# Patient Record
Sex: Male | Born: 1935 | Race: Black or African American | Hispanic: No | Smoking: Former smoker
Health system: Southern US, Community
[De-identification: ages and names within clinical notes are randomized; demographics above are authoritative.]

## PROBLEM LIST (undated history)

## (undated) DIAGNOSIS — D500 Iron deficiency anemia secondary to blood loss (chronic): Secondary | ICD-10-CM

## (undated) DIAGNOSIS — F329 Major depressive disorder, single episode, unspecified: Secondary | ICD-10-CM

## (undated) DIAGNOSIS — M545 Low back pain, unspecified: Secondary | ICD-10-CM

## (undated) DIAGNOSIS — K219 Gastro-esophageal reflux disease without esophagitis: Secondary | ICD-10-CM

## (undated) DIAGNOSIS — E785 Hyperlipidemia, unspecified: Secondary | ICD-10-CM

## (undated) DIAGNOSIS — I10 Essential (primary) hypertension: Secondary | ICD-10-CM

## (undated) DIAGNOSIS — F419 Anxiety disorder, unspecified: Secondary | ICD-10-CM

## (undated) DIAGNOSIS — I351 Nonrheumatic aortic (valve) insufficiency: Secondary | ICD-10-CM

## (undated) DIAGNOSIS — I5189 Other ill-defined heart diseases: Secondary | ICD-10-CM

## (undated) DIAGNOSIS — H409 Unspecified glaucoma: Secondary | ICD-10-CM

## (undated) DIAGNOSIS — I639 Cerebral infarction, unspecified: Secondary | ICD-10-CM

## (undated) DIAGNOSIS — C61 Malignant neoplasm of prostate: Secondary | ICD-10-CM

## (undated) DIAGNOSIS — M1990 Unspecified osteoarthritis, unspecified site: Secondary | ICD-10-CM

## (undated) DIAGNOSIS — N529 Male erectile dysfunction, unspecified: Secondary | ICD-10-CM

## (undated) DIAGNOSIS — I2510 Atherosclerotic heart disease of native coronary artery without angina pectoris: Secondary | ICD-10-CM

## (undated) DIAGNOSIS — F32A Depression, unspecified: Secondary | ICD-10-CM

## (undated) DIAGNOSIS — R7989 Other specified abnormal findings of blood chemistry: Secondary | ICD-10-CM

## (undated) DIAGNOSIS — I6350 Cerebral infarction due to unspecified occlusion or stenosis of unspecified cerebral artery: Secondary | ICD-10-CM

## (undated) DIAGNOSIS — R778 Other specified abnormalities of plasma proteins: Secondary | ICD-10-CM

## (undated) DIAGNOSIS — E119 Type 2 diabetes mellitus without complications: Secondary | ICD-10-CM

## (undated) HISTORY — PX: REPLACEMENT TOTAL KNEE BILATERAL: SUR1225

## (undated) HISTORY — DX: Low back pain: M54.5

## (undated) HISTORY — DX: Cerebral infarction due to unspecified occlusion or stenosis of unspecified cerebral artery: I63.50

## (undated) HISTORY — DX: Unspecified glaucoma: H40.9

## (undated) HISTORY — DX: Iron deficiency anemia secondary to blood loss (chronic): D50.0

## (undated) HISTORY — DX: Nonrheumatic aortic (valve) insufficiency: I35.1

## (undated) HISTORY — DX: Cerebral infarction, unspecified: I63.9

## (undated) HISTORY — DX: Other ill-defined heart diseases: I51.89

## (undated) HISTORY — DX: Anxiety disorder, unspecified: F41.9

## (undated) HISTORY — DX: Gastro-esophageal reflux disease without esophagitis: K21.9

## (undated) HISTORY — DX: Hyperlipidemia, unspecified: E78.5

## (undated) HISTORY — DX: Other specified abnormalities of plasma proteins: R77.8

## (undated) HISTORY — DX: Major depressive disorder, single episode, unspecified: F32.9

## (undated) HISTORY — PX: CARPAL TUNNEL RELEASE: SHX101

## (undated) HISTORY — DX: Male erectile dysfunction, unspecified: N52.9

## (undated) HISTORY — DX: Atherosclerotic heart disease of native coronary artery without angina pectoris: I25.10

## (undated) HISTORY — DX: Low back pain, unspecified: M54.50

## (undated) HISTORY — DX: Other specified abnormal findings of blood chemistry: R79.89

## (undated) HISTORY — DX: Depression, unspecified: F32.A

---

## 2004-06-24 ENCOUNTER — Ambulatory Visit: Payer: Self-pay | Admitting: Internal Medicine

## 2004-11-28 ENCOUNTER — Ambulatory Visit: Payer: Self-pay | Admitting: Internal Medicine

## 2005-05-11 ENCOUNTER — Ambulatory Visit: Payer: Self-pay | Admitting: Radiation Oncology

## 2005-10-03 ENCOUNTER — Inpatient Hospital Stay: Payer: Self-pay | Admitting: Specialist

## 2005-11-13 ENCOUNTER — Encounter: Payer: Self-pay | Admitting: Specialist

## 2005-12-03 ENCOUNTER — Encounter: Payer: Self-pay | Admitting: Specialist

## 2006-01-03 ENCOUNTER — Encounter: Payer: Self-pay | Admitting: Specialist

## 2006-09-07 ENCOUNTER — Ambulatory Visit: Payer: Self-pay | Admitting: Specialist

## 2006-09-28 ENCOUNTER — Inpatient Hospital Stay: Payer: Self-pay | Admitting: Specialist

## 2006-10-22 ENCOUNTER — Encounter: Payer: Self-pay | Admitting: Specialist

## 2006-11-04 ENCOUNTER — Encounter: Payer: Self-pay | Admitting: Specialist

## 2006-12-04 ENCOUNTER — Encounter: Payer: Self-pay | Admitting: Specialist

## 2007-01-04 ENCOUNTER — Encounter: Payer: Self-pay | Admitting: Specialist

## 2007-04-22 ENCOUNTER — Ambulatory Visit: Payer: Self-pay | Admitting: Specialist

## 2007-04-22 ENCOUNTER — Other Ambulatory Visit: Payer: Self-pay

## 2007-04-25 ENCOUNTER — Ambulatory Visit: Payer: Self-pay | Admitting: Internal Medicine

## 2007-04-26 ENCOUNTER — Inpatient Hospital Stay: Payer: Self-pay | Admitting: Specialist

## 2007-05-08 ENCOUNTER — Encounter: Payer: Self-pay | Admitting: Specialist

## 2007-05-25 ENCOUNTER — Emergency Department: Payer: Self-pay | Admitting: Emergency Medicine

## 2007-05-28 ENCOUNTER — Ambulatory Visit: Payer: Self-pay | Admitting: Specialist

## 2007-06-06 ENCOUNTER — Encounter: Payer: Self-pay | Admitting: Specialist

## 2007-07-07 ENCOUNTER — Encounter: Payer: Self-pay | Admitting: Specialist

## 2007-08-04 ENCOUNTER — Encounter: Payer: Self-pay | Admitting: Specialist

## 2007-12-19 ENCOUNTER — Ambulatory Visit: Payer: Self-pay | Admitting: Internal Medicine

## 2008-01-23 ENCOUNTER — Encounter: Payer: Self-pay | Admitting: Specialist

## 2008-02-04 ENCOUNTER — Encounter: Payer: Self-pay | Admitting: Specialist

## 2008-02-07 ENCOUNTER — Other Ambulatory Visit: Payer: Self-pay

## 2008-02-07 ENCOUNTER — Ambulatory Visit: Payer: Self-pay

## 2008-09-29 ENCOUNTER — Encounter: Payer: Self-pay | Admitting: Specialist

## 2008-10-03 ENCOUNTER — Encounter: Payer: Self-pay | Admitting: Specialist

## 2008-11-03 ENCOUNTER — Encounter: Payer: Self-pay | Admitting: Specialist

## 2009-11-17 ENCOUNTER — Encounter: Payer: Self-pay | Admitting: Specialist

## 2009-12-03 ENCOUNTER — Encounter: Payer: Self-pay | Admitting: Specialist

## 2010-01-03 ENCOUNTER — Encounter: Payer: Self-pay | Admitting: Specialist

## 2010-02-02 ENCOUNTER — Ambulatory Visit: Payer: Self-pay | Admitting: Cardiovascular Disease

## 2011-04-21 ENCOUNTER — Ambulatory Visit: Payer: Self-pay | Admitting: Specialist

## 2011-05-05 ENCOUNTER — Ambulatory Visit: Payer: Self-pay | Admitting: Specialist

## 2011-07-05 ENCOUNTER — Ambulatory Visit: Payer: Self-pay | Admitting: Ophthalmology

## 2011-07-05 DIAGNOSIS — I10 Essential (primary) hypertension: Secondary | ICD-10-CM

## 2011-07-18 ENCOUNTER — Ambulatory Visit: Payer: Self-pay | Admitting: Ophthalmology

## 2011-08-23 ENCOUNTER — Ambulatory Visit: Payer: Self-pay

## 2011-08-23 ENCOUNTER — Ambulatory Visit: Payer: Self-pay | Admitting: Urology

## 2012-02-09 DIAGNOSIS — N529 Male erectile dysfunction, unspecified: Secondary | ICD-10-CM | POA: Insufficient documentation

## 2012-04-01 ENCOUNTER — Ambulatory Visit: Payer: Self-pay | Admitting: Ophthalmology

## 2012-04-02 ENCOUNTER — Ambulatory Visit: Payer: Self-pay | Admitting: Ophthalmology

## 2012-10-07 DIAGNOSIS — C61 Malignant neoplasm of prostate: Secondary | ICD-10-CM | POA: Insufficient documentation

## 2013-06-06 DIAGNOSIS — M545 Low back pain, unspecified: Secondary | ICD-10-CM | POA: Diagnosis not present

## 2013-06-06 DIAGNOSIS — IMO0002 Reserved for concepts with insufficient information to code with codable children: Secondary | ICD-10-CM | POA: Diagnosis not present

## 2013-06-16 DIAGNOSIS — F411 Generalized anxiety disorder: Secondary | ICD-10-CM | POA: Diagnosis not present

## 2013-06-16 DIAGNOSIS — F320 Major depressive disorder, single episode, mild: Secondary | ICD-10-CM | POA: Diagnosis not present

## 2013-06-20 DIAGNOSIS — G5600 Carpal tunnel syndrome, unspecified upper limb: Secondary | ICD-10-CM | POA: Diagnosis not present

## 2013-06-20 DIAGNOSIS — G250 Essential tremor: Secondary | ICD-10-CM | POA: Diagnosis not present

## 2013-06-20 DIAGNOSIS — G609 Hereditary and idiopathic neuropathy, unspecified: Secondary | ICD-10-CM | POA: Diagnosis not present

## 2013-06-26 DIAGNOSIS — M479 Spondylosis, unspecified: Secondary | ICD-10-CM | POA: Diagnosis not present

## 2013-06-26 DIAGNOSIS — S8290XD Unspecified fracture of unspecified lower leg, subsequent encounter for closed fracture with routine healing: Secondary | ICD-10-CM | POA: Diagnosis not present

## 2013-06-26 DIAGNOSIS — IMO0002 Reserved for concepts with insufficient information to code with codable children: Secondary | ICD-10-CM | POA: Diagnosis not present

## 2013-06-26 DIAGNOSIS — Z96659 Presence of unspecified artificial knee joint: Secondary | ICD-10-CM | POA: Diagnosis not present

## 2013-07-08 DIAGNOSIS — K5289 Other specified noninfective gastroenteritis and colitis: Secondary | ICD-10-CM | POA: Diagnosis not present

## 2013-07-08 DIAGNOSIS — E119 Type 2 diabetes mellitus without complications: Secondary | ICD-10-CM | POA: Diagnosis not present

## 2013-07-16 DIAGNOSIS — I10 Essential (primary) hypertension: Secondary | ICD-10-CM | POA: Diagnosis not present

## 2013-08-04 DIAGNOSIS — M999 Biomechanical lesion, unspecified: Secondary | ICD-10-CM | POA: Diagnosis not present

## 2013-08-04 DIAGNOSIS — M549 Dorsalgia, unspecified: Secondary | ICD-10-CM | POA: Diagnosis not present

## 2013-08-04 DIAGNOSIS — IMO0001 Reserved for inherently not codable concepts without codable children: Secondary | ICD-10-CM | POA: Diagnosis not present

## 2013-08-04 DIAGNOSIS — M5137 Other intervertebral disc degeneration, lumbosacral region: Secondary | ICD-10-CM | POA: Diagnosis not present

## 2013-08-04 DIAGNOSIS — M5380 Other specified dorsopathies, site unspecified: Secondary | ICD-10-CM | POA: Diagnosis not present

## 2013-08-06 DIAGNOSIS — M549 Dorsalgia, unspecified: Secondary | ICD-10-CM | POA: Diagnosis not present

## 2013-08-06 DIAGNOSIS — M999 Biomechanical lesion, unspecified: Secondary | ICD-10-CM | POA: Diagnosis not present

## 2013-08-06 DIAGNOSIS — IMO0001 Reserved for inherently not codable concepts without codable children: Secondary | ICD-10-CM | POA: Diagnosis not present

## 2013-08-06 DIAGNOSIS — M5380 Other specified dorsopathies, site unspecified: Secondary | ICD-10-CM | POA: Diagnosis not present

## 2013-08-06 DIAGNOSIS — M5137 Other intervertebral disc degeneration, lumbosacral region: Secondary | ICD-10-CM | POA: Diagnosis not present

## 2013-08-07 DIAGNOSIS — M5137 Other intervertebral disc degeneration, lumbosacral region: Secondary | ICD-10-CM | POA: Diagnosis not present

## 2013-08-07 DIAGNOSIS — IMO0001 Reserved for inherently not codable concepts without codable children: Secondary | ICD-10-CM | POA: Diagnosis not present

## 2013-08-07 DIAGNOSIS — M549 Dorsalgia, unspecified: Secondary | ICD-10-CM | POA: Diagnosis not present

## 2013-08-07 DIAGNOSIS — M999 Biomechanical lesion, unspecified: Secondary | ICD-10-CM | POA: Diagnosis not present

## 2013-08-07 DIAGNOSIS — M5380 Other specified dorsopathies, site unspecified: Secondary | ICD-10-CM | POA: Diagnosis not present

## 2013-08-11 DIAGNOSIS — M5137 Other intervertebral disc degeneration, lumbosacral region: Secondary | ICD-10-CM | POA: Diagnosis not present

## 2013-08-11 DIAGNOSIS — M999 Biomechanical lesion, unspecified: Secondary | ICD-10-CM | POA: Diagnosis not present

## 2013-08-11 DIAGNOSIS — M5380 Other specified dorsopathies, site unspecified: Secondary | ICD-10-CM | POA: Diagnosis not present

## 2013-08-11 DIAGNOSIS — IMO0001 Reserved for inherently not codable concepts without codable children: Secondary | ICD-10-CM | POA: Diagnosis not present

## 2013-08-11 DIAGNOSIS — M549 Dorsalgia, unspecified: Secondary | ICD-10-CM | POA: Diagnosis not present

## 2013-08-18 DIAGNOSIS — M549 Dorsalgia, unspecified: Secondary | ICD-10-CM | POA: Diagnosis not present

## 2013-08-18 DIAGNOSIS — IMO0001 Reserved for inherently not codable concepts without codable children: Secondary | ICD-10-CM | POA: Diagnosis not present

## 2013-08-18 DIAGNOSIS — M999 Biomechanical lesion, unspecified: Secondary | ICD-10-CM | POA: Diagnosis not present

## 2013-08-18 DIAGNOSIS — M5137 Other intervertebral disc degeneration, lumbosacral region: Secondary | ICD-10-CM | POA: Diagnosis not present

## 2013-08-18 DIAGNOSIS — M5380 Other specified dorsopathies, site unspecified: Secondary | ICD-10-CM | POA: Diagnosis not present

## 2013-08-20 DIAGNOSIS — F331 Major depressive disorder, recurrent, moderate: Secondary | ICD-10-CM | POA: Diagnosis not present

## 2013-08-21 DIAGNOSIS — M5380 Other specified dorsopathies, site unspecified: Secondary | ICD-10-CM | POA: Diagnosis not present

## 2013-08-21 DIAGNOSIS — I891 Lymphangitis: Secondary | ICD-10-CM | POA: Diagnosis not present

## 2013-08-21 DIAGNOSIS — IMO0001 Reserved for inherently not codable concepts without codable children: Secondary | ICD-10-CM | POA: Diagnosis not present

## 2013-08-21 DIAGNOSIS — M999 Biomechanical lesion, unspecified: Secondary | ICD-10-CM | POA: Diagnosis not present

## 2013-08-25 DIAGNOSIS — M5380 Other specified dorsopathies, site unspecified: Secondary | ICD-10-CM | POA: Diagnosis not present

## 2013-08-25 DIAGNOSIS — M549 Dorsalgia, unspecified: Secondary | ICD-10-CM | POA: Diagnosis not present

## 2013-08-25 DIAGNOSIS — M999 Biomechanical lesion, unspecified: Secondary | ICD-10-CM | POA: Diagnosis not present

## 2013-08-25 DIAGNOSIS — M5137 Other intervertebral disc degeneration, lumbosacral region: Secondary | ICD-10-CM | POA: Diagnosis not present

## 2013-08-25 DIAGNOSIS — IMO0001 Reserved for inherently not codable concepts without codable children: Secondary | ICD-10-CM | POA: Diagnosis not present

## 2013-08-28 DIAGNOSIS — IMO0001 Reserved for inherently not codable concepts without codable children: Secondary | ICD-10-CM | POA: Diagnosis not present

## 2013-08-28 DIAGNOSIS — M5380 Other specified dorsopathies, site unspecified: Secondary | ICD-10-CM | POA: Diagnosis not present

## 2013-08-28 DIAGNOSIS — M549 Dorsalgia, unspecified: Secondary | ICD-10-CM | POA: Diagnosis not present

## 2013-08-28 DIAGNOSIS — M5137 Other intervertebral disc degeneration, lumbosacral region: Secondary | ICD-10-CM | POA: Diagnosis not present

## 2013-08-28 DIAGNOSIS — M999 Biomechanical lesion, unspecified: Secondary | ICD-10-CM | POA: Diagnosis not present

## 2013-09-02 DIAGNOSIS — I2510 Atherosclerotic heart disease of native coronary artery without angina pectoris: Secondary | ICD-10-CM | POA: Diagnosis not present

## 2013-09-02 DIAGNOSIS — E119 Type 2 diabetes mellitus without complications: Secondary | ICD-10-CM | POA: Diagnosis not present

## 2013-09-02 DIAGNOSIS — E785 Hyperlipidemia, unspecified: Secondary | ICD-10-CM | POA: Diagnosis not present

## 2013-09-02 DIAGNOSIS — I10 Essential (primary) hypertension: Secondary | ICD-10-CM | POA: Diagnosis not present

## 2013-09-04 DIAGNOSIS — IMO0001 Reserved for inherently not codable concepts without codable children: Secondary | ICD-10-CM | POA: Diagnosis not present

## 2013-09-04 DIAGNOSIS — M999 Biomechanical lesion, unspecified: Secondary | ICD-10-CM | POA: Diagnosis not present

## 2013-09-04 DIAGNOSIS — I891 Lymphangitis: Secondary | ICD-10-CM | POA: Diagnosis not present

## 2013-09-04 DIAGNOSIS — M5137 Other intervertebral disc degeneration, lumbosacral region: Secondary | ICD-10-CM | POA: Diagnosis not present

## 2013-09-04 DIAGNOSIS — M5380 Other specified dorsopathies, site unspecified: Secondary | ICD-10-CM | POA: Diagnosis not present

## 2013-09-10 DIAGNOSIS — M999 Biomechanical lesion, unspecified: Secondary | ICD-10-CM | POA: Diagnosis not present

## 2013-09-10 DIAGNOSIS — M5137 Other intervertebral disc degeneration, lumbosacral region: Secondary | ICD-10-CM | POA: Diagnosis not present

## 2013-09-10 DIAGNOSIS — M5380 Other specified dorsopathies, site unspecified: Secondary | ICD-10-CM | POA: Diagnosis not present

## 2013-09-10 DIAGNOSIS — IMO0001 Reserved for inherently not codable concepts without codable children: Secondary | ICD-10-CM | POA: Diagnosis not present

## 2013-09-10 DIAGNOSIS — M549 Dorsalgia, unspecified: Secondary | ICD-10-CM | POA: Diagnosis not present

## 2013-09-11 DIAGNOSIS — I10 Essential (primary) hypertension: Secondary | ICD-10-CM | POA: Diagnosis not present

## 2013-10-03 DIAGNOSIS — R262 Difficulty in walking, not elsewhere classified: Secondary | ICD-10-CM | POA: Diagnosis not present

## 2013-10-03 DIAGNOSIS — M25569 Pain in unspecified knee: Secondary | ICD-10-CM | POA: Diagnosis not present

## 2013-10-13 DIAGNOSIS — M2540 Effusion, unspecified joint: Secondary | ICD-10-CM | POA: Diagnosis not present

## 2013-10-13 DIAGNOSIS — I10 Essential (primary) hypertension: Secondary | ICD-10-CM | POA: Diagnosis not present

## 2013-10-22 DIAGNOSIS — M25569 Pain in unspecified knee: Secondary | ICD-10-CM | POA: Diagnosis not present

## 2013-10-22 DIAGNOSIS — R262 Difficulty in walking, not elsewhere classified: Secondary | ICD-10-CM | POA: Diagnosis not present

## 2013-10-29 DIAGNOSIS — R262 Difficulty in walking, not elsewhere classified: Secondary | ICD-10-CM | POA: Diagnosis not present

## 2013-10-29 DIAGNOSIS — M25569 Pain in unspecified knee: Secondary | ICD-10-CM | POA: Diagnosis not present

## 2013-10-31 DIAGNOSIS — M25569 Pain in unspecified knee: Secondary | ICD-10-CM | POA: Diagnosis not present

## 2013-10-31 DIAGNOSIS — R262 Difficulty in walking, not elsewhere classified: Secondary | ICD-10-CM | POA: Diagnosis not present

## 2013-11-03 DIAGNOSIS — R262 Difficulty in walking, not elsewhere classified: Secondary | ICD-10-CM | POA: Diagnosis not present

## 2013-11-03 DIAGNOSIS — M25569 Pain in unspecified knee: Secondary | ICD-10-CM | POA: Diagnosis not present

## 2013-11-10 DIAGNOSIS — H4010X0 Unspecified open-angle glaucoma, stage unspecified: Secondary | ICD-10-CM | POA: Diagnosis not present

## 2013-11-12 DIAGNOSIS — F411 Generalized anxiety disorder: Secondary | ICD-10-CM | POA: Diagnosis not present

## 2013-11-12 DIAGNOSIS — F331 Major depressive disorder, recurrent, moderate: Secondary | ICD-10-CM | POA: Diagnosis not present

## 2013-11-17 DIAGNOSIS — C61 Malignant neoplasm of prostate: Secondary | ICD-10-CM | POA: Diagnosis not present

## 2013-12-03 DIAGNOSIS — M25569 Pain in unspecified knee: Secondary | ICD-10-CM | POA: Diagnosis not present

## 2013-12-03 DIAGNOSIS — R262 Difficulty in walking, not elsewhere classified: Secondary | ICD-10-CM | POA: Diagnosis not present

## 2013-12-12 ENCOUNTER — Ambulatory Visit: Payer: Self-pay | Admitting: Medical

## 2013-12-12 DIAGNOSIS — I10 Essential (primary) hypertension: Secondary | ICD-10-CM | POA: Diagnosis not present

## 2013-12-12 DIAGNOSIS — L089 Local infection of the skin and subcutaneous tissue, unspecified: Secondary | ICD-10-CM | POA: Diagnosis not present

## 2013-12-12 DIAGNOSIS — H409 Unspecified glaucoma: Secondary | ICD-10-CM | POA: Diagnosis not present

## 2013-12-12 DIAGNOSIS — W57XXXA Bitten or stung by nonvenomous insect and other nonvenomous arthropods, initial encounter: Secondary | ICD-10-CM | POA: Diagnosis not present

## 2013-12-12 DIAGNOSIS — Z79899 Other long term (current) drug therapy: Secondary | ICD-10-CM | POA: Diagnosis not present

## 2013-12-12 DIAGNOSIS — Z96659 Presence of unspecified artificial knee joint: Secondary | ICD-10-CM | POA: Diagnosis not present

## 2013-12-12 DIAGNOSIS — E119 Type 2 diabetes mellitus without complications: Secondary | ICD-10-CM | POA: Diagnosis not present

## 2013-12-24 DIAGNOSIS — E7800 Pure hypercholesterolemia, unspecified: Secondary | ICD-10-CM | POA: Diagnosis not present

## 2013-12-24 DIAGNOSIS — E119 Type 2 diabetes mellitus without complications: Secondary | ICD-10-CM | POA: Diagnosis not present

## 2013-12-24 DIAGNOSIS — I2510 Atherosclerotic heart disease of native coronary artery without angina pectoris: Secondary | ICD-10-CM | POA: Diagnosis not present

## 2013-12-24 DIAGNOSIS — I10 Essential (primary) hypertension: Secondary | ICD-10-CM | POA: Diagnosis not present

## 2014-01-03 DIAGNOSIS — R262 Difficulty in walking, not elsewhere classified: Secondary | ICD-10-CM | POA: Diagnosis not present

## 2014-01-03 DIAGNOSIS — M25569 Pain in unspecified knee: Secondary | ICD-10-CM | POA: Diagnosis not present

## 2014-01-07 DIAGNOSIS — F411 Generalized anxiety disorder: Secondary | ICD-10-CM | POA: Diagnosis not present

## 2014-01-07 DIAGNOSIS — F331 Major depressive disorder, recurrent, moderate: Secondary | ICD-10-CM | POA: Diagnosis not present

## 2014-02-12 DIAGNOSIS — I2510 Atherosclerotic heart disease of native coronary artery without angina pectoris: Secondary | ICD-10-CM | POA: Diagnosis not present

## 2014-02-12 DIAGNOSIS — E785 Hyperlipidemia, unspecified: Secondary | ICD-10-CM | POA: Diagnosis not present

## 2014-02-12 DIAGNOSIS — F3289 Other specified depressive episodes: Secondary | ICD-10-CM | POA: Diagnosis not present

## 2014-02-12 DIAGNOSIS — E119 Type 2 diabetes mellitus without complications: Secondary | ICD-10-CM | POA: Diagnosis not present

## 2014-02-12 DIAGNOSIS — F329 Major depressive disorder, single episode, unspecified: Secondary | ICD-10-CM | POA: Diagnosis not present

## 2014-02-12 DIAGNOSIS — I10 Essential (primary) hypertension: Secondary | ICD-10-CM | POA: Diagnosis not present

## 2014-02-19 DIAGNOSIS — H4010X0 Unspecified open-angle glaucoma, stage unspecified: Secondary | ICD-10-CM | POA: Diagnosis not present

## 2014-03-11 DIAGNOSIS — F3131 Bipolar disorder, current episode depressed, mild: Secondary | ICD-10-CM | POA: Diagnosis not present

## 2014-03-13 DIAGNOSIS — G250 Essential tremor: Secondary | ICD-10-CM | POA: Diagnosis not present

## 2014-03-13 DIAGNOSIS — R2689 Other abnormalities of gait and mobility: Secondary | ICD-10-CM | POA: Insufficient documentation

## 2014-03-17 DIAGNOSIS — Z0000 Encounter for general adult medical examination without abnormal findings: Secondary | ICD-10-CM | POA: Diagnosis not present

## 2014-03-17 DIAGNOSIS — C61 Malignant neoplasm of prostate: Secondary | ICD-10-CM | POA: Diagnosis not present

## 2014-03-17 DIAGNOSIS — I351 Nonrheumatic aortic (valve) insufficiency: Secondary | ICD-10-CM | POA: Diagnosis not present

## 2014-03-17 DIAGNOSIS — I2510 Atherosclerotic heart disease of native coronary artery without angina pectoris: Secondary | ICD-10-CM | POA: Diagnosis not present

## 2014-03-17 DIAGNOSIS — E785 Hyperlipidemia, unspecified: Secondary | ICD-10-CM | POA: Diagnosis not present

## 2014-03-17 DIAGNOSIS — I501 Left ventricular failure: Secondary | ICD-10-CM | POA: Diagnosis not present

## 2014-03-17 DIAGNOSIS — D500 Iron deficiency anemia secondary to blood loss (chronic): Secondary | ICD-10-CM | POA: Diagnosis not present

## 2014-03-17 DIAGNOSIS — E119 Type 2 diabetes mellitus without complications: Secondary | ICD-10-CM | POA: Diagnosis not present

## 2014-03-17 DIAGNOSIS — Z23 Encounter for immunization: Secondary | ICD-10-CM | POA: Diagnosis not present

## 2014-03-17 DIAGNOSIS — F341 Dysthymic disorder: Secondary | ICD-10-CM | POA: Diagnosis not present

## 2014-03-17 DIAGNOSIS — I10 Essential (primary) hypertension: Secondary | ICD-10-CM | POA: Diagnosis not present

## 2014-03-19 DIAGNOSIS — R2681 Unsteadiness on feet: Secondary | ICD-10-CM | POA: Diagnosis not present

## 2014-03-19 DIAGNOSIS — D500 Iron deficiency anemia secondary to blood loss (chronic): Secondary | ICD-10-CM | POA: Diagnosis not present

## 2014-04-08 DIAGNOSIS — F3131 Bipolar disorder, current episode depressed, mild: Secondary | ICD-10-CM | POA: Diagnosis not present

## 2014-04-08 DIAGNOSIS — F411 Generalized anxiety disorder: Secondary | ICD-10-CM | POA: Diagnosis not present

## 2014-04-13 ENCOUNTER — Encounter: Payer: Self-pay | Admitting: Neurology

## 2014-04-13 DIAGNOSIS — R262 Difficulty in walking, not elsewhere classified: Secondary | ICD-10-CM | POA: Diagnosis not present

## 2014-05-04 DIAGNOSIS — I10 Essential (primary) hypertension: Secondary | ICD-10-CM | POA: Diagnosis not present

## 2014-05-04 DIAGNOSIS — G250 Essential tremor: Secondary | ICD-10-CM | POA: Diagnosis not present

## 2014-05-05 ENCOUNTER — Encounter: Payer: Self-pay | Admitting: Neurology

## 2014-05-05 DIAGNOSIS — R262 Difficulty in walking, not elsewhere classified: Secondary | ICD-10-CM | POA: Diagnosis not present

## 2014-05-11 DIAGNOSIS — H4010X1 Unspecified open-angle glaucoma, mild stage: Secondary | ICD-10-CM | POA: Diagnosis not present

## 2014-05-20 DIAGNOSIS — G250 Essential tremor: Secondary | ICD-10-CM | POA: Diagnosis not present

## 2014-06-05 ENCOUNTER — Encounter: Payer: Self-pay | Admitting: Neurology

## 2014-06-05 DIAGNOSIS — R262 Difficulty in walking, not elsewhere classified: Secondary | ICD-10-CM | POA: Diagnosis not present

## 2014-06-09 DIAGNOSIS — I351 Nonrheumatic aortic (valve) insufficiency: Secondary | ICD-10-CM | POA: Diagnosis not present

## 2014-06-09 DIAGNOSIS — F341 Dysthymic disorder: Secondary | ICD-10-CM | POA: Diagnosis not present

## 2014-06-09 DIAGNOSIS — G250 Essential tremor: Secondary | ICD-10-CM | POA: Diagnosis not present

## 2014-06-09 DIAGNOSIS — I2510 Atherosclerotic heart disease of native coronary artery without angina pectoris: Secondary | ICD-10-CM | POA: Diagnosis not present

## 2014-06-09 DIAGNOSIS — I10 Essential (primary) hypertension: Secondary | ICD-10-CM | POA: Diagnosis not present

## 2014-06-09 DIAGNOSIS — E785 Hyperlipidemia, unspecified: Secondary | ICD-10-CM | POA: Diagnosis not present

## 2014-06-09 DIAGNOSIS — I501 Left ventricular failure: Secondary | ICD-10-CM | POA: Diagnosis not present

## 2014-06-09 DIAGNOSIS — I272 Other secondary pulmonary hypertension: Secondary | ICD-10-CM | POA: Diagnosis not present

## 2014-06-09 DIAGNOSIS — D500 Iron deficiency anemia secondary to blood loss (chronic): Secondary | ICD-10-CM | POA: Diagnosis not present

## 2014-06-09 DIAGNOSIS — E119 Type 2 diabetes mellitus without complications: Secondary | ICD-10-CM | POA: Diagnosis not present

## 2014-06-09 DIAGNOSIS — C61 Malignant neoplasm of prostate: Secondary | ICD-10-CM | POA: Diagnosis not present

## 2014-06-09 DIAGNOSIS — M545 Low back pain: Secondary | ICD-10-CM | POA: Diagnosis not present

## 2014-06-10 DIAGNOSIS — R262 Difficulty in walking, not elsewhere classified: Secondary | ICD-10-CM | POA: Diagnosis not present

## 2014-06-17 DIAGNOSIS — R262 Difficulty in walking, not elsewhere classified: Secondary | ICD-10-CM | POA: Diagnosis not present

## 2014-07-01 DIAGNOSIS — R262 Difficulty in walking, not elsewhere classified: Secondary | ICD-10-CM | POA: Diagnosis not present

## 2014-07-06 ENCOUNTER — Encounter: Payer: Self-pay | Admitting: Neurology

## 2014-07-08 DIAGNOSIS — M79674 Pain in right toe(s): Secondary | ICD-10-CM | POA: Diagnosis not present

## 2014-07-08 DIAGNOSIS — M79675 Pain in left toe(s): Secondary | ICD-10-CM | POA: Diagnosis not present

## 2014-07-08 DIAGNOSIS — B351 Tinea unguium: Secondary | ICD-10-CM | POA: Diagnosis not present

## 2014-07-25 ENCOUNTER — Ambulatory Visit: Payer: Self-pay | Admitting: Emergency Medicine

## 2014-07-25 DIAGNOSIS — E119 Type 2 diabetes mellitus without complications: Secondary | ICD-10-CM | POA: Diagnosis not present

## 2014-07-25 DIAGNOSIS — I10 Essential (primary) hypertension: Secondary | ICD-10-CM | POA: Diagnosis not present

## 2014-07-25 DIAGNOSIS — Z96652 Presence of left artificial knee joint: Secondary | ICD-10-CM | POA: Diagnosis not present

## 2014-07-25 DIAGNOSIS — E860 Dehydration: Secondary | ICD-10-CM | POA: Diagnosis not present

## 2014-07-25 DIAGNOSIS — Z79899 Other long term (current) drug therapy: Secondary | ICD-10-CM | POA: Diagnosis not present

## 2014-07-25 DIAGNOSIS — H409 Unspecified glaucoma: Secondary | ICD-10-CM | POA: Diagnosis not present

## 2014-08-12 DIAGNOSIS — Z8546 Personal history of malignant neoplasm of prostate: Secondary | ICD-10-CM | POA: Diagnosis not present

## 2014-08-20 DIAGNOSIS — E119 Type 2 diabetes mellitus without complications: Secondary | ICD-10-CM | POA: Diagnosis not present

## 2014-08-20 DIAGNOSIS — D500 Iron deficiency anemia secondary to blood loss (chronic): Secondary | ICD-10-CM | POA: Diagnosis not present

## 2014-08-20 DIAGNOSIS — M545 Low back pain: Secondary | ICD-10-CM | POA: Diagnosis not present

## 2014-09-03 DIAGNOSIS — M5137 Other intervertebral disc degeneration, lumbosacral region: Secondary | ICD-10-CM | POA: Diagnosis not present

## 2014-09-03 DIAGNOSIS — M545 Low back pain: Secondary | ICD-10-CM | POA: Diagnosis not present

## 2014-09-09 DIAGNOSIS — G250 Essential tremor: Secondary | ICD-10-CM | POA: Diagnosis not present

## 2014-09-11 DIAGNOSIS — C61 Malignant neoplasm of prostate: Secondary | ICD-10-CM | POA: Diagnosis not present

## 2014-09-22 NOTE — Op Note (Signed)
PATIENT NAME:  Todd [name], Todd [name] MR#:  [id number] DATE OF BIRTH:  [birth year]  DATE OF PROCEDURE:  04/02/2012  PREOPERATIVE DIAGNOSIS: Visually significant cataract of the right eye.   POSTOPERATIVE DIAGNOSIS: Visually significant cataract of the right eye.   OPERATIVE PROCEDURE: Cataract extraction by phacoemulsification with implant of intraocular lens to right eye.   SURGEON: Birder Robson, MD.   ANESTHESIA:  1. Managed anesthesia care.  2. Topical tetracaine drops followed by 2% Xylocaine jelly applied in the preoperative holding area.   COMPLICATIONS: None.   TECHNIQUE:  Stop and chop.  DESCRIPTION OF PROCEDURE: The patient was examined and consented in the preoperative holding area where the aforementioned topical anesthesia was applied to the right eye and then brought back to the Operating Room where the right eye was prepped and draped in the usual sterile ophthalmic fashion and a lid speculum was placed. A paracentesis was created with the side port blade and the anterior chamber was filled with viscoelastic. A near clear corneal incision was performed with the steel keratome. A continuous curvilinear capsulorrhexis was performed with a cystotome followed by the capsulorrhexis forceps. Hydrodissection and hydrodelineation were carried out with BSS on a blunt cannula. The lens was removed in a stop and chop technique and the remaining cortical material was removed with the irrigation-aspiration handpiece. The capsular bag was inflated with viscoelastic and the Technis ZCBOO   23.5-diopter lens, serial number [id number], was placed in the capsular bag without complication. The remaining viscoelastic was removed from the eye with the irrigation-aspiration handpiece. The wounds were hydrated. The anterior chamber was flushed with Miostat and the eye was inflated to physiologic pressure. The wounds were found to be water tight. The eye was dressed with Vigamox. The patient was given protective  glasses to wear throughout the day and a shield with which to sleep tonight. The patient was also given drops with which to begin a drop regimen today and will follow-up with me in one day.   ____________________________ Livingston Diones. Braylen Staller, MD wlp:cbb D: 04/02/2012 15:25:29 ET T: 04/02/2012 15:32:58 ET JOB#: [id number]  cc: Rudolf Blizard L. Itzael Liptak, MD, <Dictator> Livingston Diones Roshun Klingensmith MD ELECTRONICALLY SIGNED 04/11/2012 10:23

## 2014-09-25 ENCOUNTER — Other Ambulatory Visit: Payer: Self-pay | Admitting: Unknown Physician Specialty

## 2014-09-27 NOTE — Op Note (Signed)
PATIENT NAME:  [name], Todd MR#:  [id number] DATE OF BIRTH:  05-10-1931  DATE OF PROCEDURE:  07/18/2011  PREOPERATIVE DIAGNOSIS: Visually significant cataract of the left eye.   POSTOPERATIVE DIAGNOSIS: Visually significant cataract of the left eye.   OPERATIVE PROCEDURE: Cataract extraction by phacoemulsification with implant of intraocular lens to left eye.   SURGEON: Birder Robson, MD.   ANESTHESIA:  1. Managed anesthesia care.  2. Topical tetracaine drops followed by 2% Xylocaine jelly applied in the preoperative holding area.   COMPLICATIONS: None.   TECHNIQUE:  Four quadrant divide and conquer.  DESCRIPTION OF PROCEDURE: The patient was examined and consented in the preoperative holding area where the aforementioned topical anesthesia was applied to the left eye and then brought back to the Operating Room where the left eye was prepped and draped in the usual sterile ophthalmic fashion and a lid speculum was placed. A paracentesis was created with the side port blade and the anterior chamber was filled with viscoelastic. A near clear corneal incision was performed with the steel keratome. A continuous curvilinear capsulorrhexis was performed with a cystotome followed by the capsulorrhexis forceps. Hydrodissection and hydrodelineation were carried out with BSS on a blunt cannula. The lens was removed in a four quadrant divide and conquer technique and the remaining cortical material was removed with the irrigation-aspiration handpiece. The capsular bag was inflated with viscoelastic and the [employer]nis ZCBOO 23.5-diopter lens, serial number [id number] was placed in the capsular bag without complication. The remaining viscoelastic was removed from the eye with the irrigation-aspiration handpiece. The wounds were hydrated. The anterior chamber was flushed with Miostat and the eye was inflated to physiologic pressure. The wounds were found to be water tight. The eye was dressed with Vigamox and  Omnipred. The patient was given protective glasses to wear throughout the day and a shield with which to sleep tonight. The patient was also given drops with which to begin a drop regimen today and will follow-up with me in one day.  ____________________________ Livingston Diones. Mithra Spano, MD wlp:slb D: 07/18/2011 21:49:55 ET T: 07/19/2011 09:17:22 ET JOB#: [id number]  cc: Elisha Mcgruder L. Clint Strupp, MD, <Dictator> Livingston Diones Laymon Stockert MD ELECTRONICALLY SIGNED 07/25/2011 12:26

## 2014-09-30 DIAGNOSIS — C61 Malignant neoplasm of prostate: Secondary | ICD-10-CM | POA: Diagnosis not present

## 2014-10-03 ENCOUNTER — Ambulatory Visit
Admit: 2014-10-03 | Disposition: A | Discharge status: Home / Self Care | Payer: Self-pay | Attending: Unknown Physician Specialty | Admitting: Unknown Physician Specialty

## 2014-10-03 DIAGNOSIS — M47816 Spondylosis without myelopathy or radiculopathy, lumbar region: Secondary | ICD-10-CM | POA: Diagnosis not present

## 2014-10-03 DIAGNOSIS — M4806 Spinal stenosis, lumbar region: Secondary | ICD-10-CM | POA: Diagnosis not present

## 2014-10-07 DIAGNOSIS — M4806 Spinal stenosis, lumbar region: Secondary | ICD-10-CM | POA: Diagnosis not present

## 2014-10-28 DIAGNOSIS — F320 Major depressive disorder, single episode, mild: Secondary | ICD-10-CM | POA: Diagnosis not present

## 2014-11-09 DIAGNOSIS — H4011X1 Primary open-angle glaucoma, mild stage: Secondary | ICD-10-CM | POA: Diagnosis not present

## 2014-11-20 ENCOUNTER — Ambulatory Visit
Admission: EM | Admit: 2014-11-20 | Discharge: 2014-11-20 | Disposition: A | Discharge status: Home / Self Care | Payer: Medicare Other | Attending: Family Medicine | Admitting: Family Medicine

## 2014-11-20 ENCOUNTER — Encounter: Payer: Self-pay | Admitting: Emergency Medicine

## 2014-11-20 DIAGNOSIS — E119 Type 2 diabetes mellitus without complications: Secondary | ICD-10-CM | POA: Diagnosis not present

## 2014-11-20 DIAGNOSIS — Z87891 Personal history of nicotine dependence: Secondary | ICD-10-CM | POA: Diagnosis not present

## 2014-11-20 DIAGNOSIS — Z8546 Personal history of malignant neoplasm of prostate: Secondary | ICD-10-CM | POA: Insufficient documentation

## 2014-11-20 DIAGNOSIS — M4807 Spinal stenosis, lumbosacral region: Secondary | ICD-10-CM

## 2014-11-20 DIAGNOSIS — Z7982 Long term (current) use of aspirin: Secondary | ICD-10-CM | POA: Diagnosis not present

## 2014-11-20 DIAGNOSIS — R5383 Other fatigue: Secondary | ICD-10-CM | POA: Diagnosis present

## 2014-11-20 DIAGNOSIS — I10 Essential (primary) hypertension: Secondary | ICD-10-CM | POA: Insufficient documentation

## 2014-11-20 HISTORY — DX: Type 2 diabetes mellitus without complications: E11.9

## 2014-11-20 HISTORY — DX: Unspecified osteoarthritis, unspecified site: M19.90

## 2014-11-20 HISTORY — DX: Essential (primary) hypertension: I10

## 2014-11-20 HISTORY — DX: Malignant neoplasm of prostate: C61

## 2014-11-20 LAB — COMPREHENSIVE METABOLIC PANEL
ALT: 12 U/L — ABNORMAL LOW (ref 17–63)
AST: 21 U/L (ref 15–41)
Albumin: 4.3 g/dL (ref 3.5–5.0)
Alkaline Phosphatase: 83 U/L (ref 38–126)
Anion gap: 10 (ref 5–15)
BUN: 23 mg/dL — ABNORMAL HIGH (ref 6–20)
CO2: 25 mmol/L (ref 22–32)
Calcium: 9.1 mg/dL (ref 8.9–10.3)
Chloride: 105 mmol/L (ref 101–111)
Creatinine, Ser: 0.92 mg/dL (ref 0.61–1.24)
GFR calc Af Amer: >60 mL/min (ref >60)
GFR calc non Af Amer: >60 mL/min (ref >60)
Glucose, Bld: 135 mg/dL — ABNORMAL HIGH (ref 65–99)
Potassium: 4.3 mmol/L (ref 3.5–5.1)
Sodium: 140 mmol/L (ref 135–145)
Total Bilirubin: 0.8 mg/dL (ref 0.3–1.2)
Total Protein: 7.4 g/dL (ref 6.5–8.1)

## 2014-11-20 LAB — URINALYSIS COMPLETE WITH MICROSCOPIC (ARMC ONLY)
Bacteria, UA: NONE SEEN — AB
Glucose, UA: NEGATIVE mg/dL
Hgb urine dipstick: NEGATIVE
Ketones, ur: NEGATIVE mg/dL
Leukocytes, UA: NEGATIVE
Nitrite: NEGATIVE
Protein, ur: NEGATIVE mg/dL
RBC / HPF: NONE SEEN RBC/hpf (ref <3)
Specific Gravity, Urine: 1.025 (ref 1.005–1.030)
WBC, UA: NONE SEEN WBC/hpf (ref <3)
pH: 5.5 (ref 5.0–8.0)

## 2014-11-20 LAB — CBC WITH DIFFERENTIAL/PLATELET
Basophils Absolute: 0 10*3/uL (ref 0–0.1)
Basophils Relative: 1 %
Eosinophils Absolute: 0.2 10*3/uL (ref 0–0.7)
Eosinophils Relative: 3 %
HCT: 36.1 % — ABNORMAL LOW (ref 40.0–52.0)
Hemoglobin: 12 g/dL — ABNORMAL LOW (ref 13.0–18.0)
Lymphocytes Relative: 29 %
Lymphs Abs: 1.5 10*3/uL (ref 1.0–3.6)
MCH: 32 pg (ref 26.0–34.0)
MCHC: 33.2 g/dL (ref 32.0–36.0)
MCV: 96.5 fL (ref 80.0–100.0)
Monocytes Absolute: 0.6 10*3/uL (ref 0.2–1.0)
Monocytes Relative: 12 %
Neutro Abs: 2.9 10*3/uL (ref 1.4–6.5)
Neutrophils Relative %: 55 %
Platelets: 193 10*3/uL (ref 150–440)
RBC: 3.74 MIL/uL — ABNORMAL LOW (ref 4.40–5.90)
RDW: 12.8 % (ref 11.5–14.5)
WBC: 5.3 10*3/uL (ref 3.8–10.6)

## 2014-11-20 NOTE — Discharge Instructions (Signed)
Please report to Dr Jeananne Rama on Monday- or to the Emergency Room over the weekend if there is a change in his condition or his concerns. They can access the labs and EKG we saw this evening.  Be sure to eat correctly - don't skip meals.- to  keep your blood sugar steady  Drink  lots of water to stay hydrated..,,tea with sugar is not recommended for diabetics  Your PT exercises made you feel well in the past--see if you can find them and practice them again!

## 2014-11-20 NOTE — ED Notes (Signed)
Feels weak all over with gait and balance problems for 1 week . Feel 2 weeks ago

## 2014-11-20 NOTE — ED Provider Notes (Signed)
CSN: [id number]     Arrival date & time 11/20/14  1559 History   First MD Initiated Contact with Patient 11/20/14 1711     Chief Complaint  Patient presents with  . Fatigue   (Consider location/radiation/quality/duration/timing/severity/associated sxs/prior Treatment) HPI 81 yo M with his daughter concerned about feeling "legs are weak". On questioning this has been an issue for many months and followed by his PCP. Last year it evolved into a referral to PT and an exercise program which he enjoyed very much and believe helped him feel stronger -but he could not afford to keep it going. He denies any acute change today or in recent days. Daughter denies being aware of any specific problems. He lives alone and manages his household,  medications, ( monitored by daughter), still drives and grocery shops locally. Denies SOB, chest pain, increased fatigue. He is very non-specific about concerns today-second daughter arrived and both daughters are in attendance and deny witnessing any recent acute changes. Has had some past falls-uses cane as support  Past Medical History  Diagnosis Date  . Hypertension   . Diabetes mellitus without complication   . Arthritis   . Prostate cancer    Past Surgical History  Procedure Laterality Date  . Replacement total knee bilateral     History reviewed. No pertinent family history. History  Substance Use Topics  . Smoking status: Former Research scientist (life sciences)  . Smokeless tobacco: Not on file  . Alcohol Use: Not on file     Comment: occasional    Review of Systems Review of 10 systems negative for acute change except as referenced in HPI  Allergies  Review of patient's allergies indicates no known allergies.  Home Medications   Prior to Admission medications   Medication Sig Start Date End Date Taking? Authorizing Provider  amLODipine (NORVASC) 5 MG tablet Take 5 mg by mouth daily.   Yes Historical Provider, MD  aspirin 81 MG tablet Take 81 mg by mouth daily.   Yes  Historical Provider, MD  clonazePAM (KLONOPIN) 0.5 MG tablet Take 0.5 mg by mouth 2 (two) times daily as needed for anxiety.   Yes Historical Provider, MD  DULoxetine (CYMBALTA) 30 MG capsule Take 30 mg by mouth daily.   Yes Historical Provider, MD  gabapentin (NEURONTIN) 600 MG tablet Take 600 mg by mouth 2 (two) times daily. Taking 1/2 twice daily   Yes Historical Provider, MD  meloxicam (MOBIC) 15 MG tablet Take 15 mg by mouth daily.   Yes Historical Provider, MD  metFORMIN (GLUCOPHAGE) 500 MG tablet Take 500 mg by mouth once.   Yes Historical Provider, MD  omeprazole (PRILOSEC) 20 MG capsule Take 20 mg by mouth daily.   Yes Historical Provider, MD  primidone (MYSOLINE) 50 MG tablet Take by mouth 2 (two) times daily.   Yes Historical Provider, MD  propranolol (INDERAL) 60 MG tablet Take 60 mg by mouth once.   Yes Historical Provider, MD  solifenacin (VESICARE) 5 MG tablet Take 5 mg by mouth daily.   Yes Historical Provider, MD  telmisartan (MICARDIS) 80 MG tablet Take 80 mg by mouth daily.   Yes Historical Provider, MD  Travoprost, BAK Free, (TRAVATAN) 0.004 % SOLN ophthalmic solution Place 1 drop into both eyes at bedtime.   Yes Historical Provider, MD   BP 159/69 mmHg  Pulse 78  Temp(Src) 97.8 F (36.6 C) (Tympanic)  Ht 5\' 10"  (1.778 m)  Wt 184 lb (83.462 kg)  BMI 26.40 kg/m2  SpO2 98% Physical  Exam Constitutional -alert and oriented,well appearing and in no acute distress. animated interactive elderly M Head-atraumatic Eyes-  EOMI ,conjugate gaze Ears- canals and TMs WNL Nose- no congestion or rhinorrhea Mouth/throat- mucous membranes moist , Neck- supple without glandular enlargement CV- regular rate, grossly normal heart sounds, good peripheral circulation Resp-no distress, normal respiratory effort,clear to auscultation bilaterally GI- soft,non-tender,no distention GU- not examined MSK- Bilateral knee replacements, doing very well- walks with cane for stability; falters  slightly moving from chair to walking but steadies himself without assistance. No lower extremity tenderness nor edema,no joint effusion, ambulatory Neuro- normal speech and language, no gross focal neurological deficit appreciated,  Skin-warm,dry ,intact; no rash noted Psych-mood and affect grossly normal; speech and behavior grossly normal ED Course  Procedures (including critical care time) Labs Review   Imaging Review No results found. Results for orders placed or performed during the hospital encounter of 11/20/14  CBC with Differential  Result Value Ref Range   WBC 5.3 3.8 - 10.6 K/uL   RBC 3.74 (L) 4.40 - 5.90 MIL/uL   Hemoglobin 12.0 (L) 13.0 - 18.0 g/dL   HCT 36.1 (L) 40.0 - 52.0 %   MCV 96.5 80.0 - 100.0 fL   MCH 32.0 26.0 - 34.0 pg   MCHC 33.2 32.0 - 36.0 g/dL   RDW 12.8 11.5 - 14.5 %   Platelets 193 150 - 440 K/uL   Neutrophils Relative % 55 %   Neutro Abs 2.9 1.4 - 6.5 K/uL   Lymphocytes Relative 29 %   Lymphs Abs 1.5 1.0 - 3.6 K/uL   Monocytes Relative 12 %   Monocytes Absolute 0.6 0.2 - 1.0 K/uL   Eosinophils Relative 3 %   Eosinophils Absolute 0.2 0 - 0.7 K/uL   Basophils Relative 1 %   Basophils Absolute 0.0 0 - 0.1 K/uL  Comprehensive metabolic panel  Result Value Ref Range   Sodium 140 135 - 145 mmol/L   Potassium 4.3 3.5 - 5.1 mmol/L   Chloride 105 101 - 111 mmol/L   CO2 25 22 - 32 mmol/L   Glucose, Bld 135 (H) 65 - 99 mg/dL   BUN 23 (H) 6 - 20 mg/dL   Creatinine, Ser 0.92 0.61 - 1.24 mg/dL   Calcium 9.1 8.9 - 10.3 mg/dL   Total Protein 7.4 6.5 - 8.1 g/dL   Albumin 4.3 3.5 - 5.0 g/dL   AST 21 15 - 41 U/L   ALT 12 (L) 17 - 63 U/L   Alkaline Phosphatase 83 38 - 126 U/L   Total Bilirubin 0.8 0.3 - 1.2 mg/dL   GFR calc non Af Amer >60 >60 mL/min   GFR calc Af Amer >60 >60 mL/min   Anion gap 10 5 - 15  Urinalysis complete, with microscopic  Result Value Ref Range   Color, Urine YELLOW YELLOW   APPearance CLEAR CLEAR   Glucose, UA NEGATIVE  NEGATIVE mg/dL   Bilirubin Urine 1+ (A) NEGATIVE   Ketones, ur NEGATIVE NEGATIVE mg/dL   Specific Gravity, Urine 1.025 1.005 - 1.030   Hgb urine dipstick NEGATIVE NEGATIVE   pH 5.5 5.0 - 8.0   Protein, ur NEGATIVE NEGATIVE mg/dL   Nitrite NEGATIVE NEGATIVE   Leukocytes, UA NEGATIVE NEGATIVE   RBC / HPF NONE SEEN <3 RBC/hpf   WBC, UA NONE SEEN <3 WBC/hpf   Bacteria, UA NONE SEEN (A) RARE   Squamous Epithelial / LPF 0-5 (A) RARE   Mucous PRESENT    CBC with mild anemia,  Panel unremarkable for random after meal labs.  EKG- Sinus rhythm with PACs, Incomplete right bundle branch block, left anterior fascicular, Abnormal EKG--reviewed and consulted with Dr Posey Pronto and felt not to reflect any acute change    MDM   1. Spinal stenosis of lumbosacral region     Although the patient refers to his legs being tired there does not seem to be any acute change in his strength or capability.His status is similar to his level of performance the past months according to both daughters but they reacted because he said his legs felt weak. Discussed arthritic changes in the spine and they were all familiar with the information from PCP. He tends to skip meals and doesn't drink much-we talked about taking care of his DM because he will feel better when sugars are steady--and then he can walk better.  Not happy about the aging process and increasing limitations. Encouraged to find his PT chair exercises and put himself back to work on them.  Diagnosis and treatment discussed. . Questions fielded, expectations and recommendations reviewed. Patient and family express understanding. Will return to [facility] with questions, concern or exacerbation.They will  schedule follow up with PCP .   Jan Fireman, PA-C 11/23/14 1112

## 2014-11-22 ENCOUNTER — Encounter: Payer: Self-pay | Admitting: Physician Assistant

## 2014-11-25 ENCOUNTER — Ambulatory Visit: Payer: Medicare Other | Admitting: Psychiatry

## 2014-12-02 ENCOUNTER — Encounter: Payer: Self-pay | Admitting: Family Medicine

## 2014-12-02 ENCOUNTER — Ambulatory Visit (INDEPENDENT_AMBULATORY_CARE_PROVIDER_SITE_OTHER): Payer: Medicare Other | Admitting: Family Medicine

## 2014-12-02 VITALS — BP 180/74 | HR 64 | Temp 97.6°F | Ht 67.0 in | Wt 183.0 lb

## 2014-12-02 DIAGNOSIS — E119 Type 2 diabetes mellitus without complications: Secondary | ICD-10-CM

## 2014-12-02 DIAGNOSIS — I10 Essential (primary) hypertension: Secondary | ICD-10-CM | POA: Insufficient documentation

## 2014-12-02 DIAGNOSIS — S60012A Contusion of left thumb without damage to nail, initial encounter: Secondary | ICD-10-CM | POA: Diagnosis not present

## 2014-12-02 DIAGNOSIS — E1169 Type 2 diabetes mellitus with other specified complication: Secondary | ICD-10-CM | POA: Insufficient documentation

## 2014-12-02 DIAGNOSIS — E1142 Type 2 diabetes mellitus with diabetic polyneuropathy: Secondary | ICD-10-CM | POA: Insufficient documentation

## 2014-12-02 DIAGNOSIS — E785 Hyperlipidemia, unspecified: Secondary | ICD-10-CM | POA: Insufficient documentation

## 2014-12-02 MED ORDER — RAMIPRIL 10 MG PO CAPS: 10.0000 mg | ORAL_CAPSULE | Freq: Every day | ORAL | Status: DC

## 2014-12-02 NOTE — Progress Notes (Signed)
BP 180/74 mmHg  Pulse 64  Temp(Src) 97.6 F (36.4 C)  Ht 5\' 7"  (1.702 m)  Wt 183 lb (83.008 kg)  BMI 28.66 kg/m2  SpO2 96%   Subjective:    Patient ID: Todd [name], male    DOB: 05-25-1931, 81 y.o.   MRN: [id number]  HPI: Todd [name] is a 81 y.o. male  Chief Complaint  Patient presents with  . thumb pain    left hand, fell about 4 weeks ago  hand slowly  getting better still swolen and cant move  BP in past after discussion with daughter was better on ramipril Wants to try No side effects Takes meds every day  Relevant past medical, surgical, family and social history reviewed and updated as indicated. Interim medical history since our last visit reviewed. Allergies and medications reviewed and updated.  Review of Systems  Constitutional: Negative.   Respiratory: Negative.   Cardiovascular: Negative.     Per HPI unless specifically indicated above     Objective:    BP 180/74 mmHg  Pulse 64  Temp(Src) 97.6 F (36.4 C)  Ht 5\' 7"  (1.702 m)  Wt 183 lb (83.008 kg)  BMI 28.66 kg/m2  SpO2 96%  Wt Readings from Last 3 Encounters:  12/02/14 183 lb (83.008 kg)  12/02/14 182 lb (82.555 kg)  11/20/14 184 lb (83.462 kg)    Physical Exam  Constitutional: He is oriented to person, place, and time. He appears well-developed and well-nourished. No distress.  HENT:  Head: Normocephalic and atraumatic.  Right Ear: Hearing normal.  Left Ear: Hearing normal.  Nose: Nose normal.  Eyes: Conjunctivae and lids are normal. Right eye exhibits no discharge. Left eye exhibits no discharge. No scleral icterus.  Pulmonary/Chest: Effort normal. No respiratory distress.  Musculoskeletal:       Left hand: He exhibits decreased range of motion, bony tenderness and swelling. Normal sensation noted. Decreased strength noted. He exhibits no thumb/finger opposition.       Hands: Neurological: He is alert and oriented to person, place, and time.  Skin: Skin is intact. No rash noted.    Psychiatric: He has a normal mood and affect. His speech is normal and behavior is normal. Judgment and thought content normal. Cognition and memory are normal.    Results for orders placed or performed during the hospital encounter of 11/20/14  CBC with Differential  Result Value Ref Range   WBC 5.3 3.8 - 10.6 K/uL   RBC 3.74 (L) 4.40 - 5.90 MIL/uL   Hemoglobin 12.0 (L) 13.0 - 18.0 g/dL   HCT 36.1 (L) 40.0 - 52.0 %   MCV 96.5 80.0 - 100.0 fL   MCH 32.0 26.0 - 34.0 pg   MCHC 33.2 32.0 - 36.0 g/dL   RDW 12.8 11.5 - 14.5 %   Platelets 193 150 - 440 K/uL   Neutrophils Relative % 55 %   Neutro Abs 2.9 1.4 - 6.5 K/uL   Lymphocytes Relative 29 %   Lymphs Abs 1.5 1.0 - 3.6 K/uL   Monocytes Relative 12 %   Monocytes Absolute 0.6 0.2 - 1.0 K/uL   Eosinophils Relative 3 %   Eosinophils Absolute 0.2 0 - 0.7 K/uL   Basophils Relative 1 %   Basophils Absolute 0.0 0 - 0.1 K/uL  Comprehensive metabolic panel  Result Value Ref Range   Sodium 140 135 - 145 mmol/L   Potassium 4.3 3.5 - 5.1 mmol/L   Chloride 105 101 - 111 mmol/L  CO2 25 22 - 32 mmol/L   Glucose, Bld 135 (H) 65 - 99 mg/dL   BUN 23 (H) 6 - 20 mg/dL   Creatinine, Ser 0.92 0.61 - 1.24 mg/dL   Calcium 9.1 8.9 - 10.3 mg/dL   Total Protein 7.4 6.5 - 8.1 g/dL   Albumin 4.3 3.5 - 5.0 g/dL   AST 21 15 - 41 U/L   ALT 12 (L) 17 - 63 U/L   Alkaline Phosphatase 83 38 - 126 U/L   Total Bilirubin 0.8 0.3 - 1.2 mg/dL   GFR calc non Af Amer >60 >60 mL/min   GFR calc Af Amer >60 >60 mL/min   Anion gap 10 5 - 15  Urinalysis complete, with microscopic  Result Value Ref Range   Color, Urine YELLOW YELLOW   APPearance CLEAR CLEAR   Glucose, UA NEGATIVE NEGATIVE mg/dL   Bilirubin Urine 1+ (A) NEGATIVE   Ketones, ur NEGATIVE NEGATIVE mg/dL   Specific Gravity, Urine 1.025 1.005 - 1.030   Hgb urine dipstick NEGATIVE NEGATIVE   pH 5.5 5.0 - 8.0   Protein, ur NEGATIVE NEGATIVE mg/dL   Nitrite NEGATIVE NEGATIVE   Leukocytes, UA NEGATIVE  NEGATIVE   RBC / HPF NONE SEEN <3 RBC/hpf   WBC, UA NONE SEEN <3 WBC/hpf   Bacteria, UA NONE SEEN (A) RARE   Squamous Epithelial / LPF 0-5 (A) RARE   Mucous PRESENT       Assessment & Plan:   Problem List Items Addressed This Visit      Cardiovascular and Mediastinum   Hypertension - Primary    Stop micardis Start ramipril obs bp      Relevant Medications   ramipril (ALTACE) 10 MG capsule     Endocrine   Diabetes mellitus without complication   Relevant Medications   ramipril (ALTACE) 10 MG capsule   Other Relevant Orders   Bayer DCA Hb A1c Waived    Other Visit Diagnoses    Thumb contusion, left, initial encounter        with changes and no ROM will get x ray    Relevant Orders    DG Hand 2 View Left        Follow up plan: Return in about 2 months (around 02/01/2015), or if symptoms worsen or fail to improve, for BP check.

## 2014-12-02 NOTE — Assessment & Plan Note (Signed)
Stop micardis Start ramipril obs bp

## 2014-12-03 ENCOUNTER — Ambulatory Visit
Admission: RE | Admit: 2014-12-03 | Discharge: 2014-12-03 | Disposition: A | Discharge status: Home / Self Care | Payer: Medicare Other | Source: Ambulatory Visit | Attending: Family Medicine | Admitting: Family Medicine

## 2014-12-03 DIAGNOSIS — S60112A Contusion of left thumb with damage to nail, initial encounter: Secondary | ICD-10-CM | POA: Diagnosis present

## 2014-12-03 DIAGNOSIS — S62512A Displaced fracture of proximal phalanx of left thumb, initial encounter for closed fracture: Secondary | ICD-10-CM | POA: Diagnosis not present

## 2014-12-03 DIAGNOSIS — W19XXXA Unspecified fall, initial encounter: Secondary | ICD-10-CM | POA: Insufficient documentation

## 2014-12-03 DIAGNOSIS — S60012A Contusion of left thumb without damage to nail, initial encounter: Secondary | ICD-10-CM

## 2014-12-03 LAB — BAYER DCA HB A1C WAIVED: HB A1C (BAYER DCA - WAIVED): 6.2 % (ref <7.0)

## 2014-12-04 ENCOUNTER — Other Ambulatory Visit: Payer: Self-pay | Admitting: Family Medicine

## 2014-12-04 DIAGNOSIS — S62502A Fracture of unspecified phalanx of left thumb, initial encounter for closed fracture: Secondary | ICD-10-CM

## 2014-12-14 DIAGNOSIS — S62512A Displaced fracture of proximal phalanx of left thumb, initial encounter for closed fracture: Secondary | ICD-10-CM | POA: Diagnosis not present

## 2014-12-14 DIAGNOSIS — M79642 Pain in left hand: Secondary | ICD-10-CM | POA: Diagnosis not present

## 2014-12-16 DIAGNOSIS — M5442 Lumbago with sciatica, left side: Secondary | ICD-10-CM | POA: Diagnosis not present

## 2014-12-16 DIAGNOSIS — M791 Myalgia: Secondary | ICD-10-CM | POA: Diagnosis not present

## 2014-12-16 DIAGNOSIS — M5137 Other intervertebral disc degeneration, lumbosacral region: Secondary | ICD-10-CM | POA: Diagnosis not present

## 2014-12-16 DIAGNOSIS — M62830 Muscle spasm of back: Secondary | ICD-10-CM | POA: Diagnosis not present

## 2014-12-16 DIAGNOSIS — M9903 Segmental and somatic dysfunction of lumbar region: Secondary | ICD-10-CM | POA: Diagnosis not present

## 2014-12-16 DIAGNOSIS — M9904 Segmental and somatic dysfunction of sacral region: Secondary | ICD-10-CM | POA: Diagnosis not present

## 2014-12-16 DIAGNOSIS — M5117 Intervertebral disc disorders with radiculopathy, lumbosacral region: Secondary | ICD-10-CM | POA: Diagnosis not present

## 2014-12-17 DIAGNOSIS — M5137 Other intervertebral disc degeneration, lumbosacral region: Secondary | ICD-10-CM | POA: Diagnosis not present

## 2014-12-17 DIAGNOSIS — M5442 Lumbago with sciatica, left side: Secondary | ICD-10-CM | POA: Diagnosis not present

## 2014-12-17 DIAGNOSIS — M5117 Intervertebral disc disorders with radiculopathy, lumbosacral region: Secondary | ICD-10-CM | POA: Diagnosis not present

## 2014-12-17 DIAGNOSIS — M791 Myalgia: Secondary | ICD-10-CM | POA: Diagnosis not present

## 2014-12-17 DIAGNOSIS — M9903 Segmental and somatic dysfunction of lumbar region: Secondary | ICD-10-CM | POA: Diagnosis not present

## 2014-12-17 DIAGNOSIS — M62830 Muscle spasm of back: Secondary | ICD-10-CM | POA: Diagnosis not present

## 2014-12-17 DIAGNOSIS — M9904 Segmental and somatic dysfunction of sacral region: Secondary | ICD-10-CM | POA: Diagnosis not present

## 2014-12-21 DIAGNOSIS — M9903 Segmental and somatic dysfunction of lumbar region: Secondary | ICD-10-CM | POA: Diagnosis not present

## 2014-12-21 DIAGNOSIS — M791 Myalgia: Secondary | ICD-10-CM | POA: Diagnosis not present

## 2014-12-21 DIAGNOSIS — M5442 Lumbago with sciatica, left side: Secondary | ICD-10-CM | POA: Diagnosis not present

## 2014-12-21 DIAGNOSIS — M9904 Segmental and somatic dysfunction of sacral region: Secondary | ICD-10-CM | POA: Diagnosis not present

## 2014-12-21 DIAGNOSIS — M5137 Other intervertebral disc degeneration, lumbosacral region: Secondary | ICD-10-CM | POA: Diagnosis not present

## 2014-12-21 DIAGNOSIS — M62830 Muscle spasm of back: Secondary | ICD-10-CM | POA: Diagnosis not present

## 2014-12-21 DIAGNOSIS — M5117 Intervertebral disc disorders with radiculopathy, lumbosacral region: Secondary | ICD-10-CM | POA: Diagnosis not present

## 2014-12-22 DIAGNOSIS — M5117 Intervertebral disc disorders with radiculopathy, lumbosacral region: Secondary | ICD-10-CM | POA: Diagnosis not present

## 2014-12-22 DIAGNOSIS — M5442 Lumbago with sciatica, left side: Secondary | ICD-10-CM | POA: Diagnosis not present

## 2014-12-22 DIAGNOSIS — M791 Myalgia: Secondary | ICD-10-CM | POA: Diagnosis not present

## 2014-12-22 DIAGNOSIS — M62830 Muscle spasm of back: Secondary | ICD-10-CM | POA: Diagnosis not present

## 2014-12-22 DIAGNOSIS — M9904 Segmental and somatic dysfunction of sacral region: Secondary | ICD-10-CM | POA: Diagnosis not present

## 2014-12-22 DIAGNOSIS — M5137 Other intervertebral disc degeneration, lumbosacral region: Secondary | ICD-10-CM | POA: Diagnosis not present

## 2014-12-22 DIAGNOSIS — M9903 Segmental and somatic dysfunction of lumbar region: Secondary | ICD-10-CM | POA: Diagnosis not present

## 2014-12-23 DIAGNOSIS — M5442 Lumbago with sciatica, left side: Secondary | ICD-10-CM | POA: Diagnosis not present

## 2014-12-23 DIAGNOSIS — M9903 Segmental and somatic dysfunction of lumbar region: Secondary | ICD-10-CM | POA: Diagnosis not present

## 2014-12-23 DIAGNOSIS — M5117 Intervertebral disc disorders with radiculopathy, lumbosacral region: Secondary | ICD-10-CM | POA: Diagnosis not present

## 2014-12-23 DIAGNOSIS — M62830 Muscle spasm of back: Secondary | ICD-10-CM | POA: Diagnosis not present

## 2014-12-23 DIAGNOSIS — M9904 Segmental and somatic dysfunction of sacral region: Secondary | ICD-10-CM | POA: Diagnosis not present

## 2014-12-23 DIAGNOSIS — M5137 Other intervertebral disc degeneration, lumbosacral region: Secondary | ICD-10-CM | POA: Diagnosis not present

## 2014-12-23 DIAGNOSIS — M791 Myalgia: Secondary | ICD-10-CM | POA: Diagnosis not present

## 2014-12-28 DIAGNOSIS — M9904 Segmental and somatic dysfunction of sacral region: Secondary | ICD-10-CM | POA: Diagnosis not present

## 2014-12-28 DIAGNOSIS — M5117 Intervertebral disc disorders with radiculopathy, lumbosacral region: Secondary | ICD-10-CM | POA: Diagnosis not present

## 2014-12-28 DIAGNOSIS — M5442 Lumbago with sciatica, left side: Secondary | ICD-10-CM | POA: Diagnosis not present

## 2014-12-28 DIAGNOSIS — M9903 Segmental and somatic dysfunction of lumbar region: Secondary | ICD-10-CM | POA: Diagnosis not present

## 2014-12-28 DIAGNOSIS — M791 Myalgia: Secondary | ICD-10-CM | POA: Diagnosis not present

## 2014-12-28 DIAGNOSIS — M5137 Other intervertebral disc degeneration, lumbosacral region: Secondary | ICD-10-CM | POA: Diagnosis not present

## 2014-12-28 DIAGNOSIS — M62830 Muscle spasm of back: Secondary | ICD-10-CM | POA: Diagnosis not present

## 2014-12-29 DIAGNOSIS — M9903 Segmental and somatic dysfunction of lumbar region: Secondary | ICD-10-CM | POA: Diagnosis not present

## 2014-12-29 DIAGNOSIS — M791 Myalgia: Secondary | ICD-10-CM | POA: Diagnosis not present

## 2014-12-29 DIAGNOSIS — M5117 Intervertebral disc disorders with radiculopathy, lumbosacral region: Secondary | ICD-10-CM | POA: Diagnosis not present

## 2014-12-29 DIAGNOSIS — M5137 Other intervertebral disc degeneration, lumbosacral region: Secondary | ICD-10-CM | POA: Diagnosis not present

## 2014-12-29 DIAGNOSIS — M5442 Lumbago with sciatica, left side: Secondary | ICD-10-CM | POA: Diagnosis not present

## 2014-12-29 DIAGNOSIS — M9904 Segmental and somatic dysfunction of sacral region: Secondary | ICD-10-CM | POA: Diagnosis not present

## 2014-12-29 DIAGNOSIS — M62830 Muscle spasm of back: Secondary | ICD-10-CM | POA: Diagnosis not present

## 2014-12-30 DIAGNOSIS — M5416 Radiculopathy, lumbar region: Secondary | ICD-10-CM | POA: Diagnosis not present

## 2014-12-30 DIAGNOSIS — M4806 Spinal stenosis, lumbar region: Secondary | ICD-10-CM | POA: Diagnosis not present

## 2014-12-30 DIAGNOSIS — M5136 Other intervertebral disc degeneration, lumbar region: Secondary | ICD-10-CM | POA: Diagnosis not present

## 2014-12-31 DIAGNOSIS — M9904 Segmental and somatic dysfunction of sacral region: Secondary | ICD-10-CM | POA: Diagnosis not present

## 2014-12-31 DIAGNOSIS — M5137 Other intervertebral disc degeneration, lumbosacral region: Secondary | ICD-10-CM | POA: Diagnosis not present

## 2014-12-31 DIAGNOSIS — M62830 Muscle spasm of back: Secondary | ICD-10-CM | POA: Diagnosis not present

## 2014-12-31 DIAGNOSIS — M5442 Lumbago with sciatica, left side: Secondary | ICD-10-CM | POA: Diagnosis not present

## 2014-12-31 DIAGNOSIS — M9903 Segmental and somatic dysfunction of lumbar region: Secondary | ICD-10-CM | POA: Diagnosis not present

## 2014-12-31 DIAGNOSIS — M791 Myalgia: Secondary | ICD-10-CM | POA: Diagnosis not present

## 2014-12-31 DIAGNOSIS — M5117 Intervertebral disc disorders with radiculopathy, lumbosacral region: Secondary | ICD-10-CM | POA: Diagnosis not present

## 2015-01-07 DIAGNOSIS — M5137 Other intervertebral disc degeneration, lumbosacral region: Secondary | ICD-10-CM | POA: Diagnosis not present

## 2015-01-07 DIAGNOSIS — M9904 Segmental and somatic dysfunction of sacral region: Secondary | ICD-10-CM | POA: Diagnosis not present

## 2015-01-07 DIAGNOSIS — M62830 Muscle spasm of back: Secondary | ICD-10-CM | POA: Diagnosis not present

## 2015-01-07 DIAGNOSIS — M5117 Intervertebral disc disorders with radiculopathy, lumbosacral region: Secondary | ICD-10-CM | POA: Diagnosis not present

## 2015-01-07 DIAGNOSIS — M5442 Lumbago with sciatica, left side: Secondary | ICD-10-CM | POA: Diagnosis not present

## 2015-01-07 DIAGNOSIS — M9903 Segmental and somatic dysfunction of lumbar region: Secondary | ICD-10-CM | POA: Diagnosis not present

## 2015-01-07 DIAGNOSIS — M791 Myalgia: Secondary | ICD-10-CM | POA: Diagnosis not present

## 2015-01-08 DIAGNOSIS — S62512D Displaced fracture of proximal phalanx of left thumb, subsequent encounter for fracture with routine healing: Secondary | ICD-10-CM | POA: Diagnosis not present

## 2015-01-08 DIAGNOSIS — M79645 Pain in left finger(s): Secondary | ICD-10-CM | POA: Diagnosis not present

## 2015-01-08 DIAGNOSIS — M4806 Spinal stenosis, lumbar region: Secondary | ICD-10-CM | POA: Diagnosis not present

## 2015-01-10 ENCOUNTER — Other Ambulatory Visit: Payer: Self-pay | Admitting: Family Medicine

## 2015-01-11 DIAGNOSIS — M791 Myalgia: Secondary | ICD-10-CM | POA: Diagnosis not present

## 2015-01-11 DIAGNOSIS — M9904 Segmental and somatic dysfunction of sacral region: Secondary | ICD-10-CM | POA: Diagnosis not present

## 2015-01-11 DIAGNOSIS — M5137 Other intervertebral disc degeneration, lumbosacral region: Secondary | ICD-10-CM | POA: Diagnosis not present

## 2015-01-11 DIAGNOSIS — M62830 Muscle spasm of back: Secondary | ICD-10-CM | POA: Diagnosis not present

## 2015-01-11 DIAGNOSIS — M5442 Lumbago with sciatica, left side: Secondary | ICD-10-CM | POA: Diagnosis not present

## 2015-01-11 DIAGNOSIS — M5117 Intervertebral disc disorders with radiculopathy, lumbosacral region: Secondary | ICD-10-CM | POA: Diagnosis not present

## 2015-01-11 DIAGNOSIS — M9903 Segmental and somatic dysfunction of lumbar region: Secondary | ICD-10-CM | POA: Diagnosis not present

## 2015-01-13 DIAGNOSIS — M62830 Muscle spasm of back: Secondary | ICD-10-CM | POA: Diagnosis not present

## 2015-01-13 DIAGNOSIS — M9903 Segmental and somatic dysfunction of lumbar region: Secondary | ICD-10-CM | POA: Diagnosis not present

## 2015-01-13 DIAGNOSIS — M5137 Other intervertebral disc degeneration, lumbosacral region: Secondary | ICD-10-CM | POA: Diagnosis not present

## 2015-01-13 DIAGNOSIS — M5442 Lumbago with sciatica, left side: Secondary | ICD-10-CM | POA: Diagnosis not present

## 2015-01-13 DIAGNOSIS — M5117 Intervertebral disc disorders with radiculopathy, lumbosacral region: Secondary | ICD-10-CM | POA: Diagnosis not present

## 2015-01-13 DIAGNOSIS — M791 Myalgia: Secondary | ICD-10-CM | POA: Diagnosis not present

## 2015-01-13 DIAGNOSIS — M9904 Segmental and somatic dysfunction of sacral region: Secondary | ICD-10-CM | POA: Diagnosis not present

## 2015-01-14 DIAGNOSIS — M9903 Segmental and somatic dysfunction of lumbar region: Secondary | ICD-10-CM | POA: Diagnosis not present

## 2015-01-14 DIAGNOSIS — M5117 Intervertebral disc disorders with radiculopathy, lumbosacral region: Secondary | ICD-10-CM | POA: Diagnosis not present

## 2015-01-14 DIAGNOSIS — M5137 Other intervertebral disc degeneration, lumbosacral region: Secondary | ICD-10-CM | POA: Diagnosis not present

## 2015-01-14 DIAGNOSIS — M9904 Segmental and somatic dysfunction of sacral region: Secondary | ICD-10-CM | POA: Diagnosis not present

## 2015-01-14 DIAGNOSIS — M791 Myalgia: Secondary | ICD-10-CM | POA: Diagnosis not present

## 2015-01-14 DIAGNOSIS — M5442 Lumbago with sciatica, left side: Secondary | ICD-10-CM | POA: Diagnosis not present

## 2015-01-18 DIAGNOSIS — M9903 Segmental and somatic dysfunction of lumbar region: Secondary | ICD-10-CM | POA: Diagnosis not present

## 2015-01-18 DIAGNOSIS — M791 Myalgia: Secondary | ICD-10-CM | POA: Diagnosis not present

## 2015-01-18 DIAGNOSIS — M5117 Intervertebral disc disorders with radiculopathy, lumbosacral region: Secondary | ICD-10-CM | POA: Diagnosis not present

## 2015-01-18 DIAGNOSIS — M5442 Lumbago with sciatica, left side: Secondary | ICD-10-CM | POA: Diagnosis not present

## 2015-01-18 DIAGNOSIS — M9904 Segmental and somatic dysfunction of sacral region: Secondary | ICD-10-CM | POA: Diagnosis not present

## 2015-01-18 DIAGNOSIS — M5137 Other intervertebral disc degeneration, lumbosacral region: Secondary | ICD-10-CM | POA: Diagnosis not present

## 2015-01-19 DIAGNOSIS — M5137 Other intervertebral disc degeneration, lumbosacral region: Secondary | ICD-10-CM | POA: Diagnosis not present

## 2015-01-19 DIAGNOSIS — M791 Myalgia: Secondary | ICD-10-CM | POA: Diagnosis not present

## 2015-01-19 DIAGNOSIS — M9903 Segmental and somatic dysfunction of lumbar region: Secondary | ICD-10-CM | POA: Diagnosis not present

## 2015-01-19 DIAGNOSIS — M9904 Segmental and somatic dysfunction of sacral region: Secondary | ICD-10-CM | POA: Diagnosis not present

## 2015-01-19 DIAGNOSIS — M5442 Lumbago with sciatica, left side: Secondary | ICD-10-CM | POA: Diagnosis not present

## 2015-01-19 DIAGNOSIS — M5117 Intervertebral disc disorders with radiculopathy, lumbosacral region: Secondary | ICD-10-CM | POA: Diagnosis not present

## 2015-01-21 DIAGNOSIS — M4807 Spinal stenosis, lumbosacral region: Secondary | ICD-10-CM | POA: Diagnosis not present

## 2015-01-21 DIAGNOSIS — M6281 Muscle weakness (generalized): Secondary | ICD-10-CM | POA: Diagnosis not present

## 2015-01-25 DIAGNOSIS — M6281 Muscle weakness (generalized): Secondary | ICD-10-CM | POA: Diagnosis not present

## 2015-01-25 DIAGNOSIS — M4807 Spinal stenosis, lumbosacral region: Secondary | ICD-10-CM | POA: Diagnosis not present

## 2015-01-27 DIAGNOSIS — M6281 Muscle weakness (generalized): Secondary | ICD-10-CM | POA: Diagnosis not present

## 2015-01-27 DIAGNOSIS — M4807 Spinal stenosis, lumbosacral region: Secondary | ICD-10-CM | POA: Diagnosis not present

## 2015-01-28 DIAGNOSIS — M5117 Intervertebral disc disorders with radiculopathy, lumbosacral region: Secondary | ICD-10-CM | POA: Diagnosis not present

## 2015-01-28 DIAGNOSIS — M5442 Lumbago with sciatica, left side: Secondary | ICD-10-CM | POA: Diagnosis not present

## 2015-01-28 DIAGNOSIS — M5137 Other intervertebral disc degeneration, lumbosacral region: Secondary | ICD-10-CM | POA: Diagnosis not present

## 2015-01-28 DIAGNOSIS — M791 Myalgia: Secondary | ICD-10-CM | POA: Diagnosis not present

## 2015-01-28 DIAGNOSIS — M9903 Segmental and somatic dysfunction of lumbar region: Secondary | ICD-10-CM | POA: Diagnosis not present

## 2015-01-28 DIAGNOSIS — M9904 Segmental and somatic dysfunction of sacral region: Secondary | ICD-10-CM | POA: Diagnosis not present

## 2015-02-01 ENCOUNTER — Ambulatory Visit: Payer: Self-pay | Admitting: Family Medicine

## 2015-02-01 DIAGNOSIS — M6281 Muscle weakness (generalized): Secondary | ICD-10-CM | POA: Diagnosis not present

## 2015-02-01 DIAGNOSIS — M4807 Spinal stenosis, lumbosacral region: Secondary | ICD-10-CM | POA: Diagnosis not present

## 2015-02-04 ENCOUNTER — Ambulatory Visit: Payer: Self-pay | Admitting: Family Medicine

## 2015-02-04 DIAGNOSIS — M791 Myalgia: Secondary | ICD-10-CM | POA: Diagnosis not present

## 2015-02-04 DIAGNOSIS — M9904 Segmental and somatic dysfunction of sacral region: Secondary | ICD-10-CM | POA: Diagnosis not present

## 2015-02-04 DIAGNOSIS — M5117 Intervertebral disc disorders with radiculopathy, lumbosacral region: Secondary | ICD-10-CM | POA: Diagnosis not present

## 2015-02-04 DIAGNOSIS — M5442 Lumbago with sciatica, left side: Secondary | ICD-10-CM | POA: Diagnosis not present

## 2015-02-04 DIAGNOSIS — M9903 Segmental and somatic dysfunction of lumbar region: Secondary | ICD-10-CM | POA: Diagnosis not present

## 2015-02-04 DIAGNOSIS — M5137 Other intervertebral disc degeneration, lumbosacral region: Secondary | ICD-10-CM | POA: Diagnosis not present

## 2015-02-12 DIAGNOSIS — M6281 Muscle weakness (generalized): Secondary | ICD-10-CM | POA: Diagnosis not present

## 2015-02-12 DIAGNOSIS — M4807 Spinal stenosis, lumbosacral region: Secondary | ICD-10-CM | POA: Diagnosis not present

## 2015-02-16 DIAGNOSIS — M4807 Spinal stenosis, lumbosacral region: Secondary | ICD-10-CM | POA: Diagnosis not present

## 2015-02-18 DIAGNOSIS — M4807 Spinal stenosis, lumbosacral region: Secondary | ICD-10-CM | POA: Diagnosis not present

## 2015-02-18 DIAGNOSIS — M6281 Muscle weakness (generalized): Secondary | ICD-10-CM | POA: Diagnosis not present

## 2015-02-23 DIAGNOSIS — M5416 Radiculopathy, lumbar region: Secondary | ICD-10-CM | POA: Diagnosis not present

## 2015-02-23 DIAGNOSIS — M5136 Other intervertebral disc degeneration, lumbar region: Secondary | ICD-10-CM | POA: Insufficient documentation

## 2015-02-23 DIAGNOSIS — M4806 Spinal stenosis, lumbar region: Secondary | ICD-10-CM | POA: Diagnosis not present

## 2015-02-23 DIAGNOSIS — M48062 Spinal stenosis, lumbar region with neurogenic claudication: Secondary | ICD-10-CM | POA: Insufficient documentation

## 2015-02-23 DIAGNOSIS — M5116 Intervertebral disc disorders with radiculopathy, lumbar region: Secondary | ICD-10-CM | POA: Insufficient documentation

## 2015-02-25 ENCOUNTER — Encounter: Payer: Self-pay | Admitting: Family Medicine

## 2015-02-25 ENCOUNTER — Ambulatory Visit (INDEPENDENT_AMBULATORY_CARE_PROVIDER_SITE_OTHER): Payer: Medicare Other | Admitting: Family Medicine

## 2015-02-25 VITALS — BP 160/81 | HR 60 | Temp 97.4°F | Ht 66.2 in | Wt 183.0 lb

## 2015-02-25 DIAGNOSIS — F329 Major depressive disorder, single episode, unspecified: Secondary | ICD-10-CM

## 2015-02-25 DIAGNOSIS — I10 Essential (primary) hypertension: Secondary | ICD-10-CM

## 2015-02-25 DIAGNOSIS — F419 Anxiety disorder, unspecified: Secondary | ICD-10-CM | POA: Insufficient documentation

## 2015-02-25 DIAGNOSIS — F32A Depression, unspecified: Secondary | ICD-10-CM | POA: Insufficient documentation

## 2015-02-25 MED ORDER — HYDROCHLOROTHIAZIDE 25 MG PO TABS: 25.0000 mg | ORAL_TABLET | Freq: Every day | ORAL | Status: DC

## 2015-02-25 NOTE — Assessment & Plan Note (Signed)
Blood pressure poor control with possibly side effects from amlodipine Will discontinue amlodipine note patient's disposition For hypertension we'll use hydrochlorothiazide 25 mg Because of potential potassium loss with this patient will try to eat some extra bananas etc.

## 2015-02-25 NOTE — Progress Notes (Signed)
BP 160/81 mmHg  Pulse 60  Temp(Src) 97.4 F (36.3 C)  Ht 5' 6.2" (1.681 m)  Wt 183 lb (83.008 kg)  BMI 29.38 kg/m2  SpO2 99%   Subjective:    Patient ID: Todd [name], male    DOB: [birth year], 81 y.o.   MRN: [id number]  HPI: Todd [name] is a 81 y.o. male  Chief Complaint  Patient presents with  . Hypertension   patient with hypertension poor control concerned about possibility of side effects from amlodipine 5 mg area did patient daughter has noticed more anxiety with amlodipine and maybe some difficulty walking.  Patient had back injection took Valium 10 mg which was very strong for the patient with marked staggering and difficulty with walking and personal care. Not maybe helped some.  Chronic anxiety doing okay but needs to do better. Shakes of largely resolved. PT HER WITH DAUGHTER WHO IS CAREGIVER    Relevant past medical, surgical, family and social history reviewed and updated as indicated. Interim medical history since our last visit reviewed. Allergies and medications reviewed and updated.  Review of Systems  Constitutional: Negative.   Respiratory: Negative.   Cardiovascular: Negative.     Per HPI unless specifically indicated above     Objective:    BP 160/81 mmHg  Pulse 60  Temp(Src) 97.4 F (36.3 C)  Ht 5' 6.2" (1.681 m)  Wt 183 lb (83.008 kg)  BMI 29.38 kg/m2  SpO2 99%  Wt Readings from Last 3 Encounters:  02/25/15 183 lb (83.008 kg)  12/02/14 183 lb (83.008 kg)  12/02/14 182 lb (82.555 kg)    Physical Exam  Constitutional: He is oriented to person, place, and time. He appears well-developed and well-nourished. No distress.  HENT:  Head: Normocephalic and atraumatic.  Right Ear: Hearing normal.  Left Ear: Hearing normal.  Nose: Nose normal.  Eyes: Conjunctivae and lids are normal. Right eye exhibits no discharge. Left eye exhibits no discharge. No scleral icterus.  Cardiovascular: Normal rate, regular rhythm and normal heart sounds.    Pulmonary/Chest: Effort normal and breath sounds normal. No respiratory distress.  Musculoskeletal: Normal range of motion.  Neurological: He is alert and oriented to person, place, and time.  Skin: Skin is intact. No rash noted.  Psychiatric: He has a normal mood and affect. His speech is normal and behavior is normal. Judgment and thought content normal. Cognition and memory are normal.    Results for orders placed or performed in visit on 12/02/14  Bayer DCA Hb A1c Waived  Result Value Ref Range   Bayer DCA Hb A1c Waived 6.2 <7.0 %      Assessment & Plan:   Problem List Items Addressed This Visit      Cardiovascular and Mediastinum   Hypertension - Primary    Blood pressure poor control with possibly side effects from amlodipine Will discontinue amlodipine note patient's disposition For hypertension we'll use hydrochlorothiazide 25 mg Because of potential potassium loss with this patient will try to eat some extra bananas etc.       Relevant Medications   hydrochlorothiazide (HYDRODIURIL) 25 MG tablet     Other   Depression    Patient's nerves anxiety may have side effects from other medicines will try decreasing gabapentin to see if getting good effect or negative effect.          Follow up plan: Return in about 4 weeks (around 03/25/2015), or if symptoms worsen or fail to improve, for Blood pressure check and  BMP to assess medication side effects.

## 2015-02-25 NOTE — Assessment & Plan Note (Signed)
Patient's nerves anxiety may have side effects from other medicines will try decreasing gabapentin to see if getting good effect or negative effect.

## 2015-03-01 ENCOUNTER — Encounter: Payer: Self-pay | Admitting: Family Medicine

## 2015-03-01 ENCOUNTER — Ambulatory Visit (INDEPENDENT_AMBULATORY_CARE_PROVIDER_SITE_OTHER): Payer: Medicare Other | Admitting: Family Medicine

## 2015-03-01 VITALS — BP 158/75 | HR 64 | Temp 97.7°F | Ht 66.0 in | Wt 180.8 lb

## 2015-03-01 DIAGNOSIS — R5383 Other fatigue: Secondary | ICD-10-CM | POA: Insufficient documentation

## 2015-03-01 DIAGNOSIS — R5382 Chronic fatigue, unspecified: Secondary | ICD-10-CM

## 2015-03-01 DIAGNOSIS — R531 Weakness: Secondary | ICD-10-CM | POA: Diagnosis not present

## 2015-03-01 NOTE — Assessment & Plan Note (Signed)
Discussed may be secondary to hydrochlorothiazide Will discontinue medication observe response if problems persist may consider BMP Reviewed CBC which was normal Reviewed urinalysis which was normal

## 2015-03-01 NOTE — Progress Notes (Signed)
   BP 158/75 mmHg  Pulse 64  Temp(Src) 97.7 F (36.5 C)  Ht 5\' 6"  (1.676 m)  Wt 180 lb 12.8 oz (82.01 kg)  BMI 29.20 kg/m2  SpO2 96%   Subjective:    Patient ID: Todd [name], male    DOB: 1930/12/06, 81 y.o.   MRN: [id number]  HPI: Todd [name] is a 81 y.o. male  Chief Complaint  Patient presents with  . Fatigue    Pt states he is "weak".   . Dizziness   patient accompanied by daughter After starting hydrochlorothiazide last week patient got significantly fatigued had marked diuresis with 25 mg started taking half a tablet but still felt very fatigued. States some dizziness but no falling no real spinning sensation No nausea vomiting diaphoresis no fever or chills cough cold   Relevant past medical, surgical, family and social history reviewed and updated as indicated. Interim medical history since our last visit reviewed. Allergies and medications reviewed and updated.  Review of Systems  Constitutional: Positive for fatigue. Negative for fever, chills and diaphoresis.  HENT: Negative.   Respiratory: Negative.   Cardiovascular: Negative.   Gastrointestinal: Negative.     Per HPI unless specifically indicated above     Objective:    BP 158/75 mmHg  Pulse 64  Temp(Src) 97.7 F (36.5 C)  Ht 5\' 6"  (1.676 m)  Wt 180 lb 12.8 oz (82.01 kg)  BMI 29.20 kg/m2  SpO2 96%  Wt Readings from Last 3 Encounters:  03/01/15 180 lb 12.8 oz (82.01 kg)  02/25/15 183 lb (83.008 kg)  12/02/14 183 lb (83.008 kg)    Physical Exam  Constitutional: He is oriented to person, place, and time. He appears well-developed and well-nourished. No distress.  HENT:  Head: Normocephalic and atraumatic.  Right Ear: Hearing normal.  Left Ear: Hearing normal.  Nose: Nose normal.  Eyes: Conjunctivae and lids are normal. Right eye exhibits no discharge. Left eye exhibits no discharge. No scleral icterus.  Neck: Normal range of motion.  Cardiovascular: Normal rate, regular rhythm and normal heart  sounds.   Pulmonary/Chest: Effort normal and breath sounds normal. No respiratory distress.  Musculoskeletal: Normal range of motion.  Lymphadenopathy:    He has no cervical adenopathy.  Neurological: He is alert and oriented to person, place, and time.  Skin: Skin is intact. No rash noted.  Psychiatric: He has a normal mood and affect. His speech is normal and behavior is normal. Judgment and thought content normal. Cognition and memory are normal.    Results for orders placed or performed in visit on 12/02/14  Bayer DCA Hb A1c Waived  Result Value Ref Range   Bayer DCA Hb A1c Waived 6.2 <7.0 %      Assessment & Plan:   Problem List Items Addressed This Visit      Other   Fatigue    Discussed may be secondary to hydrochlorothiazide Will discontinue medication observe response if problems persist may consider BMP Reviewed CBC which was normal Reviewed urinalysis which was normal       Other Visit Diagnoses    Weakness    -  Primary    Relevant Orders    CBC With Differential/Platelet    Urinalysis, Routine w reflex microscopic (not at [facility])    Basic metabolic panel        Follow up plan: Return if symptoms worsen or fail to improve, for Has appointment.

## 2015-03-02 LAB — CBC WITH DIFFERENTIAL/PLATELET
Hematocrit: 37.6 % (ref 37.5–51.0)
Hemoglobin: 12.5 g/dL — ABNORMAL LOW (ref 12.6–17.7)
Lymphocytes Absolute: 1.8 10*3/uL (ref 0.7–3.1)
Lymphs: 29 %
MCH: 32.5 pg (ref 26.6–33.0)
MCHC: 33.2 g/dL (ref 31.5–35.7)
MCV: 98 fL — ABNORMAL HIGH (ref 79–97)
MID (Absolute): 1.1 10*3/uL (ref 0.1–1.6)
MID: 17 %
Neutrophils Absolute: 3.4 10*3/uL (ref 1.4–7.0)
Neutrophils: 54 %
Platelets: 227 10*3/uL (ref 150–379)
RBC: 3.85 x10E6/uL — ABNORMAL LOW (ref 4.14–5.80)
RDW: 12.6 % (ref 12.3–15.4)
WBC: 6.3 10*3/uL (ref 3.4–10.8)

## 2015-03-02 LAB — URINALYSIS, ROUTINE W REFLEX MICROSCOPIC
Bilirubin, UA: NEGATIVE
Glucose, UA: NEGATIVE
Ketones, UA: NEGATIVE
Leukocytes, UA: NEGATIVE
Nitrite, UA: NEGATIVE
Protein, UA: NEGATIVE
RBC, UA: NEGATIVE
Specific Gravity, UA: 1.02 (ref 1.005–1.030)
Urobilinogen, Ur: 0.2 mg/dL (ref 0.2–1.0)
pH, UA: 5 (ref 5.0–7.5)

## 2015-03-04 ENCOUNTER — Encounter: Payer: Self-pay | Admitting: *Deleted

## 2015-03-04 ENCOUNTER — Emergency Department: Payer: Medicare Other

## 2015-03-04 ENCOUNTER — Observation Stay
Admission: EM | Admit: 2015-03-04 | Discharge: 2015-03-05 | Disposition: A | Discharge status: Home / Self Care | Payer: Medicare Other | Attending: Internal Medicine | Admitting: Internal Medicine

## 2015-03-04 DIAGNOSIS — Z823 Family history of stroke: Secondary | ICD-10-CM | POA: Diagnosis not present

## 2015-03-04 DIAGNOSIS — Z79899 Other long term (current) drug therapy: Secondary | ICD-10-CM | POA: Diagnosis not present

## 2015-03-04 DIAGNOSIS — K219 Gastro-esophageal reflux disease without esophagitis: Secondary | ICD-10-CM | POA: Diagnosis present

## 2015-03-04 DIAGNOSIS — D500 Iron deficiency anemia secondary to blood loss (chronic): Secondary | ICD-10-CM | POA: Diagnosis not present

## 2015-03-04 DIAGNOSIS — Z7982 Long term (current) use of aspirin: Secondary | ICD-10-CM | POA: Insufficient documentation

## 2015-03-04 DIAGNOSIS — R531 Weakness: Secondary | ICD-10-CM

## 2015-03-04 DIAGNOSIS — I501 Left ventricular failure: Secondary | ICD-10-CM | POA: Diagnosis not present

## 2015-03-04 DIAGNOSIS — F32A Depression, unspecified: Secondary | ICD-10-CM | POA: Diagnosis present

## 2015-03-04 DIAGNOSIS — R0609 Other forms of dyspnea: Secondary | ICD-10-CM | POA: Diagnosis present

## 2015-03-04 DIAGNOSIS — I351 Nonrheumatic aortic (valve) insufficiency: Secondary | ICD-10-CM | POA: Diagnosis not present

## 2015-03-04 DIAGNOSIS — Z8249 Family history of ischemic heart disease and other diseases of the circulatory system: Secondary | ICD-10-CM | POA: Insufficient documentation

## 2015-03-04 DIAGNOSIS — Z23 Encounter for immunization: Secondary | ICD-10-CM | POA: Diagnosis not present

## 2015-03-04 DIAGNOSIS — R079 Chest pain, unspecified: Secondary | ICD-10-CM | POA: Insufficient documentation

## 2015-03-04 DIAGNOSIS — I2510 Atherosclerotic heart disease of native coronary artery without angina pectoris: Secondary | ICD-10-CM | POA: Insufficient documentation

## 2015-03-04 DIAGNOSIS — Z888 Allergy status to other drugs, medicaments and biological substances status: Secondary | ICD-10-CM | POA: Diagnosis not present

## 2015-03-04 DIAGNOSIS — R0600 Dyspnea, unspecified: Secondary | ICD-10-CM | POA: Insufficient documentation

## 2015-03-04 DIAGNOSIS — R5383 Other fatigue: Secondary | ICD-10-CM | POA: Insufficient documentation

## 2015-03-04 DIAGNOSIS — M179 Osteoarthritis of knee, unspecified: Secondary | ICD-10-CM | POA: Insufficient documentation

## 2015-03-04 DIAGNOSIS — I10 Essential (primary) hypertension: Principal | ICD-10-CM | POA: Insufficient documentation

## 2015-03-04 DIAGNOSIS — F419 Anxiety disorder, unspecified: Secondary | ICD-10-CM | POA: Diagnosis not present

## 2015-03-04 DIAGNOSIS — M6281 Muscle weakness (generalized): Secondary | ICD-10-CM | POA: Diagnosis not present

## 2015-03-04 DIAGNOSIS — E785 Hyperlipidemia, unspecified: Secondary | ICD-10-CM | POA: Insufficient documentation

## 2015-03-04 DIAGNOSIS — F329 Major depressive disorder, single episode, unspecified: Secondary | ICD-10-CM | POA: Insufficient documentation

## 2015-03-04 DIAGNOSIS — E119 Type 2 diabetes mellitus without complications: Secondary | ICD-10-CM | POA: Insufficient documentation

## 2015-03-04 DIAGNOSIS — Z8546 Personal history of malignant neoplasm of prostate: Secondary | ICD-10-CM | POA: Diagnosis not present

## 2015-03-04 DIAGNOSIS — M545 Low back pain: Secondary | ICD-10-CM | POA: Insufficient documentation

## 2015-03-04 DIAGNOSIS — Z87891 Personal history of nicotine dependence: Secondary | ICD-10-CM | POA: Insufficient documentation

## 2015-03-04 DIAGNOSIS — H409 Unspecified glaucoma: Secondary | ICD-10-CM | POA: Insufficient documentation

## 2015-03-04 DIAGNOSIS — E1142 Type 2 diabetes mellitus with diabetic polyneuropathy: Secondary | ICD-10-CM

## 2015-03-04 DIAGNOSIS — R0602 Shortness of breath: Secondary | ICD-10-CM | POA: Diagnosis not present

## 2015-03-04 DIAGNOSIS — E1169 Type 2 diabetes mellitus with other specified complication: Secondary | ICD-10-CM

## 2015-03-04 LAB — URINALYSIS COMPLETE WITH MICROSCOPIC (ARMC ONLY)
Bacteria, UA: NONE SEEN
Bilirubin Urine: NEGATIVE
Glucose, UA: 50 mg/dL — AB
Hgb urine dipstick: NEGATIVE
Ketones, ur: NEGATIVE mg/dL
Leukocytes, UA: NEGATIVE
Nitrite: NEGATIVE
Protein, ur: NEGATIVE mg/dL
Specific Gravity, Urine: 1.019 (ref 1.005–1.030)
Squamous Epithelial / LPF: NONE SEEN
pH: 6 (ref 5.0–8.0)

## 2015-03-04 LAB — COMPREHENSIVE METABOLIC PANEL
ALT: 10 U/L — ABNORMAL LOW (ref 17–63)
AST: 21 U/L (ref 15–41)
Albumin: 4.4 g/dL (ref 3.5–5.0)
Alkaline Phosphatase: 90 U/L (ref 38–126)
Anion gap: 9 (ref 5–15)
BUN: 19 mg/dL (ref 6–20)
CO2: 30 mmol/L (ref 22–32)
Calcium: 9.5 mg/dL (ref 8.9–10.3)
Chloride: 103 mmol/L (ref 101–111)
Creatinine, Ser: 0.7 mg/dL (ref 0.61–1.24)
GFR calc Af Amer: >60 mL/min (ref >60)
GFR calc non Af Amer: >60 mL/min (ref >60)
Glucose, Bld: 144 mg/dL — ABNORMAL HIGH (ref 65–99)
Potassium: 4.4 mmol/L (ref 3.5–5.1)
Sodium: 142 mmol/L (ref 135–145)
Total Bilirubin: 0.4 mg/dL (ref 0.3–1.2)
Total Protein: 7.4 g/dL (ref 6.5–8.1)

## 2015-03-04 LAB — CBC
HCT: 37.6 % — ABNORMAL LOW (ref 40.0–52.0)
Hemoglobin: 12.5 g/dL — ABNORMAL LOW (ref 13.0–18.0)
MCH: 32 pg (ref 26.0–34.0)
MCHC: 33.2 g/dL (ref 32.0–36.0)
MCV: 96.4 fL (ref 80.0–100.0)
Platelets: 221 10*3/uL (ref 150–440)
RBC: 3.9 MIL/uL — ABNORMAL LOW (ref 4.40–5.90)
RDW: 12.8 % (ref 11.5–14.5)
WBC: 6.6 10*3/uL (ref 3.8–10.6)

## 2015-03-04 LAB — TROPONIN I: Troponin I: <0.03 ng/mL (ref <0.031)

## 2015-03-04 LAB — BRAIN NATRIURETIC PEPTIDE: B Natriuretic Peptide: 234 pg/mL — ABNORMAL HIGH (ref 0.0–100.0)

## 2015-03-04 MED ORDER — ASPIRIN 81 MG PO CHEW
CHEWABLE_TABLET | ORAL | Status: AC
  Administered 2015-03-04: 324 mg via ORAL
  Filled 2015-03-04: qty 4

## 2015-03-04 MED ORDER — ASPIRIN 81 MG PO CHEW
324.0000 mg | CHEWABLE_TABLET | Freq: Once | ORAL | Status: AC
  Administered 2015-03-04: 324 mg via ORAL

## 2015-03-04 NOTE — ED Notes (Addendum)
Pt reports SOB x 1 week, accompanied by weakness.  PT denies pain.  Respirations equal and unlabored at this time.  Pt's daughter reports pt hx anxiety.  Daughter reports episodes of diaphoresis. Pt with hand tremor bilat upon assessment.  Pt NAD at this time.

## 2015-03-04 NOTE — ED Notes (Signed)
Pt states still unable to give urine sample.  Pt requesting to drink water.  Per dr. Edd Fabian, ok for pt to drink.  Pt given water and reminded to press call bell when able to give urine sample.

## 2015-03-04 NOTE — ED Notes (Signed)
Pt informed urine sample needed.  Pt states unable to urinate at this time.  Pt given urinal and told to press call bell when able to provide sample.  Pt and daughter verbalized understanding.

## 2015-03-04 NOTE — H&P (Signed)
[facility] [place] NAME: Todd [name]    MR#:  [id number]  DATE OF BIRTH:  1930/06/28  DATE OF ADMISSION:  03/04/2015  PRIMARY CARE PHYSICIAN: Golden Pop, MD   REQUESTING/REFERRING PHYSICIAN: Edd Fabian, M.D.  CHIEF COMPLAINT:   Chief Complaint  Patient presents with  . Shortness of Breath    HISTORY OF PRESENT ILLNESS:  Todd [name]  is a 81 y.o. male who presents with progressive fatigue and dyspnea on exertion. Patient states that over the last several weeks to months he has had increasing fatigue and malaise, which over the past 2 weeks has gotten significantly worse. He has also been experiencing dyspnea on exertion. Today he had an acute episode of significant shortness of breath for which he came in the ED for evaluation. Initial workup largely benign with only mildly elevated BNP in the 200s. Patient does endorse some heat intolerance. Notably he has significantly difficult to control hypertension, and came in with a blood pressure in the 256L to 893T systolic today. He also complained of a little bit of brief self-limiting chest pain associated with shortness of breath today. Hospitalists were called for admission for atypical chest pain and accelerated hypertension with a possible concern for some amount of heart failure.  PAST MEDICAL HISTORY:   Past Medical History  Diagnosis Date  . Hypertension   . Diabetes mellitus without complication   . Prostate cancer   . Hyperlipidemia   . Lumbago     Lumbosacral Neuritis  . Depression   . Anxiety   . CAD (coronary artery disease)   . Iron deficiency anemia due to chronic blood loss   . GERD (gastroesophageal reflux disease)   . Glaucoma   . Arthritis     osteoarthritis-left knee  . Left heart failure   . Aortic regurgitation     PAST SURGICAL HISTORY:   Past Surgical History  Procedure Laterality Date  . Replacement total knee bilateral    . Carpal tunnel release       SOCIAL HISTORY:   Social History  Substance Use Topics  . Smoking status: Former Smoker    Quit date: 12/02/1994  . Smokeless tobacco: Never Used  . Alcohol Use: 0.6 oz/week    1 Glasses of wine per week     Comment: occasional    FAMILY HISTORY:   Family History  Problem Relation Age of Onset  . Stroke Mother   . Hypertension Mother   . Hypertension Father   . Stroke Sister     DRUG ALLERGIES:   Allergies  Allergen Reactions  . Fluoxetine     headache  . Meloxicam Other (See Comments)    Other reaction(s): Dizziness Nervousness.     MEDICATIONS AT HOME:   Prior to Admission medications   Medication Sig Start Date End Date Taking? Authorizing Provider  Alpha-Lipoic Acid 200 MG CAPS Take 200 mg by mouth daily.   Yes Historical Provider, MD  aspirin 81 MG tablet Take 81 mg by mouth daily.   Yes Historical Provider, MD  clonazePAM (KLONOPIN) 0.5 MG tablet Take by mouth. 10/28/14  Yes Historical Provider, MD  DULoxetine (CYMBALTA) 30 MG capsule Take 30 mg by mouth daily.   Yes Historical Provider, MD  gabapentin (NEURONTIN) 100 MG capsule Take 100 mg by mouth 2 (two) times daily.   Yes Historical Provider, MD  metFORMIN (GLUCOPHAGE) 500 MG tablet Take by mouth.   Yes Historical Provider, MD  omeprazole (  PRILOSEC) 20 MG capsule Take 20 mg by mouth daily.   Yes Historical Provider, MD  propranolol (INDERAL) 60 MG tablet Take 60 mg by mouth once.   Yes Historical Provider, MD  ramipril (ALTACE) 10 MG capsule Take 1 capsule (10 mg total) by mouth daily. 12/02/14  Yes Guadalupe Maple, MD  HYDROcodone-acetaminophen (NORCO/VICODIN) 5-325 MG per tablet 1 po bid prn 12/30/14   Historical Provider, MD  primidone (MYSOLINE) 50 MG tablet Take by mouth 2 (two) times daily.    Historical Provider, MD  solifenacin (VESICARE) 5 MG tablet Take 5 mg by mouth daily.    Historical Provider, MD  Travoprost, BAK Free, (TRAVATAN) 0.004 % SOLN ophthalmic solution Place 1 drop into both eyes  at bedtime.    Historical Provider, MD    REVIEW OF SYSTEMS:  Review of Systems  Constitutional: Positive for malaise/fatigue. Negative for fever, chills and weight loss.  HENT: Negative for ear pain, hearing loss and tinnitus.   Eyes: Negative for blurred vision, double vision, pain and redness.  Respiratory: Positive for shortness of breath. Negative for cough and hemoptysis.   Cardiovascular: Positive for chest pain. Negative for palpitations, orthopnea and leg swelling.  Gastrointestinal: Negative for nausea, vomiting, abdominal pain, diarrhea and constipation.  Genitourinary: Negative for dysuria, frequency and hematuria.  Musculoskeletal: Negative for back pain, joint pain and neck pain.  Skin:       No acne, rash, or lesions  Neurological: Positive for weakness. Negative for dizziness, tremors and focal weakness.  Endo/Heme/Allergies: Negative for polydipsia. Does not bruise/bleed easily.  Psychiatric/Behavioral: Negative for depression. The patient is not nervous/anxious and does not have insomnia.      VITAL SIGNS:   Filed Vitals:   03/04/15 2100 03/04/15 2130 03/04/15 2200 03/04/15 2230  BP: 158/76 199/97 165/80 175/71  Pulse: 61 80 57 58  Temp:      Resp: 16 14 18 17   Height:      Weight:      SpO2: 100% 95% 92% 99%   Wt Readings from Last 3 Encounters:  03/04/15 81.647 kg (180 lb)  03/01/15 82.01 kg (180 lb 12.8 oz)  02/25/15 83.008 kg (183 lb)    PHYSICAL EXAMINATION:  Physical Exam  Vitals reviewed. Constitutional: He is oriented to person, place, and time. He appears well-developed and well-nourished. No distress.  HENT:  Head: Normocephalic and atraumatic.  Mouth/Throat: Oropharynx is clear and moist.  Eyes: Conjunctivae and EOM are normal. Pupils are equal, round, and reactive to light. No scleral icterus.  Neck: Normal range of motion. Neck supple. No JVD present. No thyromegaly present.  Cardiovascular: Normal rate, regular rhythm and intact distal  pulses.  Exam reveals no gallop and no friction rub.   No murmur heard. Respiratory: Effort normal and breath sounds normal. No respiratory distress. He has no wheezes. He has no rales.  GI: Soft. Bowel sounds are normal. He exhibits no distension. There is no tenderness.  Musculoskeletal: Normal range of motion. He exhibits no edema.  No arthritis, no gout  Lymphadenopathy:    He has no cervical adenopathy.  Neurological: He is alert and oriented to person, place, and time. No cranial nerve deficit.  No dysarthria, no aphasia  Skin: Skin is warm and dry. No rash noted. No erythema.  Psychiatric: He has a normal mood and affect. His behavior is normal. Judgment and thought content normal.    LABORATORY PANEL:   CBC  Recent Labs Lab 03/04/15 2014  WBC 6.6  HGB 12.5*  HCT 37.6*  PLT 221   ------------------------------------------------------------------------------------------------------------------  Chemistries   Recent Labs Lab 03/04/15 2014  NA 142  K 4.4  CL 103  CO2 30  GLUCOSE 144*  BUN 19  CREATININE 0.70  CALCIUM 9.5  AST 21  ALT 10*  ALKPHOS 90  BILITOT 0.4   ------------------------------------------------------------------------------------------------------------------  Cardiac Enzymes  Recent Labs Lab 03/04/15 2014  TROPONINI <0.03   ------------------------------------------------------------------------------------------------------------------  RADIOLOGY:  Dg Chest 2 View  03/04/2015   CLINICAL DATA:  Shortness of breath and weakness for 1 week.  EXAM: CHEST  2 VIEW  COMPARISON:  September 25, 2005  FINDINGS: The heart size and mediastinal contours are within normal limits. There is no focal infiltrate, pulmonary edema, or pleural effusion. There is mild chronic elevation of right hemidiaphragm. Degenerative joint changes of the spine are identified.  IMPRESSION: No active cardiopulmonary disease.   Electronically Signed   By: Abelardo Diesel M.D.    On: 03/04/2015 20:09    EKG:   Orders placed or performed during the hospital encounter of 03/04/15  . ED EKG  . ED EKG  . EKG 12-Lead  . EKG 12-Lead    IMPRESSION AND PLAN:  Principal Problem:   Accelerated hypertension - significant elevated blood pressure on arrival here with symptoms of self-limiting chest pain and shortness of breath. His symptoms are possibly related to his difficult to control blood pressure. continue home meds and use IV urine antihypertensives to keep his blood pressure at goal of less than 160/100. Active Problems:   DOE (dyspnea on exertion) - possibly related to some amount of developing heart failure in the setting of elevated BNP, this is not clear. We'll get an echocardiogram to evaluate. Clinically no JVD or lower extremity edema, and chest x-ray did not look like heart failure. We'll also trend his cardiac enzymes tonight. Based on results of echocardiogram and cardiac enzymes, might consider cardiology consult.   Diabetes mellitus without complication - metformin and sliding scale insulin with appropriate fingerstick glucose checks, heart healthy/carb modified diet.   Depression - continue home medications for this   GERD (gastroesophageal reflux disease) - equivalent home dose PPI   Anxiety - continue home medications for this  All the records are reviewed and case discussed with ED provider. Management plans discussed with the patient and/or family.  DVT PROPHYLAXIS: SubQ lovenox  ADMISSION STATUS: Observation  CODE STATUS: Full  TOTAL TIME TAKING CARE OF THIS PATIENT: 45 minutes.    WILLIS, [place] 03/04/2015, 11:21 PM  [facility]  Office  [phone]  CC: Primary care physician; Golden Pop, MD

## 2015-03-04 NOTE — ED Notes (Signed)
Pt to triage via wheelchair.  Pt reports sob and weakness for 1 week.  Pt denies chest pain.  No cough.  No fever.

## 2015-03-04 NOTE — ED Provider Notes (Signed)
[facility] Emergency Department Sh[facility] Lobo Note  ____________________________________________  Time seen: Approximately 8:03 PM  I have reviewed the triage vital signs and the nursing notes.   HISTORY  Chief Complaint Shortness of Breath    HPI Todd [name] is a 81 y.o. male with hypertension, diabetes, lumbago, history of coronary artery disease, GERD who presents for evaluation of one week generalized weakness as well as exertional dyspnea which began today, gradual onset, intermittent. Patient has been feeling weak in general for several days. He was seen by his primary care doctor earlier this week and his HCTZ was reduced as this was thought to possibly the culprit for the generalized weakness. He has continued feeling generally weak. Today he has also developed exertional dyspnea. No chest pain. No vomiting, diarrhea, or chills. He has had intermittent episodes of sudden diaphoresis over the past week however this typically resolves when he sits in front of a fan. He has never had fever during that time. Currently has no pain complaints. He has not shortness of breath while at rest.   Past Medical History  Diagnosis Date  . Hypertension   . Diabetes mellitus without complication   . Prostate cancer   . Hyperlipidemia   . Lumbago     Lumbosacral Neuritis  . Depression   . Anxiety   . CAD (coronary artery disease)   . Iron deficiency anemia due to chronic blood loss   . GERD (gastroesophageal reflux disease)   . Glaucoma   . Arthritis     osteoarthritis-left knee  . Left heart failure   . Aortic regurgitation     Patient Active Problem List   Diagnosis Date Noted  . Fatigue 03/01/2015  . Depression 02/25/2015  . Hypertension 12/02/2014  . Diabetes mellitus without complication 28/41/3244    Past Surgical History  Procedure Laterality Date  . Replacement total knee bilateral    . Carpal tunnel release      Current Outpatient Rx  Name   Route  Sig  Dispense  Refill  . Alpha-Lipoic Acid 200 MG CAPS   Oral   Take 200 mg by mouth daily.         [name] Kitchen aspirin 81 MG tablet   Oral   Take 81 mg by mouth daily.         . clonazePAM (KLONOPIN) 0.5 MG tablet   Oral   Take by mouth.         . DULoxetine (CYMBALTA) 30 MG capsule   Oral   Take 30 mg by mouth daily.         [name] Kitchen gabapentin (NEURONTIN) 100 MG capsule   Oral   Take 100 mg by mouth 2 (two) times daily.         . metFORMIN (GLUCOPHAGE) 500 MG tablet   Oral   Take by mouth.         [name] Kitchen omeprazole (PRILOSEC) 20 MG capsule   Oral   Take 20 mg by mouth daily.         . propranolol (INDERAL) 60 MG tablet   Oral   Take 60 mg by mouth once.         . ramipril (ALTACE) 10 MG capsule   Oral   Take 1 capsule (10 mg total) by mouth daily.   30 capsule   3   . HYDROcodone-acetaminophen (NORCO/VICODIN) 5-325 MG per tablet      1 po bid prn         .  primidone (MYSOLINE) 50 MG tablet   Oral   Take by mouth 2 (two) times daily.         . solifenacin (VESICARE) 5 MG tablet   Oral   Take 5 mg by mouth daily.         . Travoprost, BAK Free, (TRAVATAN) 0.004 % SOLN ophthalmic solution   Both Eyes   Place 1 drop into both eyes at bedtime.           Allergies Fluoxetine and Meloxicam  Family History  Problem Relation Age of Onset  . Stroke Mother   . Hypertension Mother   . Hypertension Father   . Stroke Sister     Social History Social History  Substance Use Topics  . Smoking status: Former Smoker    Quit date: 12/02/1994  . Smokeless tobacco: Never Used  . Alcohol Use: 0.6 oz/week    1 Glasses of wine per week     Comment: occasional    Review of Systems Constitutional: No fever/chills Eyes: No visual changes. ENT: No sore throat. Cardiovascular: Denies chest pain. Respiratory: +shortness of breath. Gastrointestinal: No abdominal pain.  No nausea, no vomiting.  No diarrhea.  No constipation. Genitourinary: Negative for  dysuria. Musculoskeletal: Negative for back pain. Skin: Negative for rash. Neurological: Negative for headaches, focal weakness or numbness.  10-point ROS otherwise negative.  ____________________________________________   PHYSICAL EXAM:  VITAL SIGNS: ED Triage Vitals  Enc Vitals Group     BP 03/04/15 1948 148/95 mmHg     Pulse Rate 03/04/15 1948 70     Resp 03/04/15 1948 20     Temp 03/04/15 1948 98.1 F (36.7 C)     Temp src --      SpO2 03/04/15 1948 99 %     Weight 03/04/15 1948 180 lb (81.647 kg)     Height 03/04/15 1948 5\' 8"  (1.727 m)     Head Cir --      Peak Flow --      Pain Score 03/04/15 1949 4     Pain Loc --      Pain Edu? --      Excl. in [place]? --     Constitutional: Alert and oriented. Well appearing and in no acute distress. Eyes: Conjunctivae are normal. PERRL. EOMI. Head: Atraumatic. Nose: No congestion/rhinnorhea. Mouth/Throat: Mucous membranes are moist.  Oropharynx non-erythematous. Neck: No stridor.   Cardiovascular: Normal rate, regular rhythm. Grossly normal heart sounds.  Good peripheral circulation. Respiratory: Normal respiratory effort.  No retractions. Lungs CTAB. Gastrointestinal: Soft and nontender. No distention. No abdominal bruits. No CVA tenderness. Genitourinary: deferred Musculoskeletal: No lower extremity tenderness nor edema.  No joint effusions. Neurologic:  Normal speech and language. No gross focal neurologic deficits are appreciated. + resting tremor in bilateral hands. Skin:  Skin is warm, dry and intact. No rash noted. Psychiatric: Mood and affect are normal. Speech and behavior are normal.  ____________________________________________   LABS (all labs ordered are listed, but only abnormal results are displayed)  Labs Reviewed  CBC - Abnormal; Notable for the following:    RBC 3.90 (*)    Hemoglobin 12.5 (*)    HCT 37.6 (*)    All other components within normal limits  COMPREHENSIVE METABOLIC PANEL - Abnormal;  Notable for the following:    Glucose, Bld 144 (*)    ALT 10 (*)    All other components within normal limits  BRAIN NATRIURETIC PEPTIDE - Abnormal; Notable for the following:  B Natriuretic Peptide 234.0 (*)    All other components within normal limits  URINALYSIS COMPLETEWITH MICROSCOPIC ([facility] ONLY) - Abnormal; Notable for the following:    Color, Urine YELLOW (*)    APPearance CLEAR (*)    Glucose, UA 50 (*)    All other components within normal limits  TROPONIN I   ____________________________________________  EKG  ED ECG REPORT I, Joanne Gavel, the attending physician, personally viewed and interpreted this ECG.   Date: 03/04/2015  EKG Time: 20:10  Rate: 70  Rhythm: normal sinus rhythm  Axis: normal  Intervals:left anterior fascicular block  ST&T Change: no acute ST elevation. Frequent PVCs and PACs  ____________________________________________  RADIOLOGY  CXR  IMPRESSION: No active cardiopulmonary disease.  ____________________________________________   PROCEDURES  Procedure(s) performed: None  Critical Care performed: No  ____________________________________________   INITIAL IMPRESSION / ASSESSMENT AND PLAN / ED COURSE  Pertinent labs & imaging results that were available during my care of the patient were reviewed by me and considered in my medical decision making (see chart for details).  Demarian Epps is a 81 y.o. male with hypertension, diabetes, lumbago, history of coronary artery disease, GERD who presents for evaluation of one week generalized weakness as well as exertional dyspnea which began today, gradual onset, intermittent. On exam, he is nontoxic appearing and in no acute distress. Vital signs stable, he is afebrile. He has benign exam including an intact neurological examination. EKG is generally unchanged from prior and first troponin is negative however given his exertional dyspnea today and history of CAD, my concern is for atypical  ACS presentation. On further discussion with his daughter, she reports he had a catheterization in approximately 2005 at which time he had documented coronary artery disease but this was not significant enough to require stenting. She reports he has not had any provocative testing since that time has not followed up with a cardiologist. Labs are otherwise notable for mild anemia. There is mild elevation of his BNP. We'll give aspirin. H&P not consistentwith acute aortic dissection, in the absence of any hypoxia, tachypnea or increased work of breathing, PE is thought to be less likely. Awaiting urinalysis. Discussed with hospitalist, Dr. Jannifer Franklin, at 9:55 pm admission. ____________________________________________   FINAL CLINICAL IMPRESSION(S) / ED DIAGNOSES  Final diagnoses:  Weakness generalized  Exertional dyspnea      Joanne Gavel, MD 03/04/15 2155

## 2015-03-05 ENCOUNTER — Observation Stay (HOSPITAL_BASED_OUTPATIENT_CLINIC_OR_DEPARTMENT_OTHER)
Admit: 2015-03-05 | Discharge: 2015-03-05 | Disposition: A | Discharge status: Home / Self Care | Payer: Medicare Other | Attending: Internal Medicine | Admitting: Internal Medicine

## 2015-03-05 DIAGNOSIS — R0600 Dyspnea, unspecified: Secondary | ICD-10-CM | POA: Diagnosis not present

## 2015-03-05 DIAGNOSIS — R0609 Other forms of dyspnea: Secondary | ICD-10-CM | POA: Diagnosis not present

## 2015-03-05 DIAGNOSIS — E119 Type 2 diabetes mellitus without complications: Secondary | ICD-10-CM | POA: Diagnosis not present

## 2015-03-05 DIAGNOSIS — F329 Major depressive disorder, single episode, unspecified: Secondary | ICD-10-CM | POA: Diagnosis not present

## 2015-03-05 DIAGNOSIS — I10 Essential (primary) hypertension: Secondary | ICD-10-CM | POA: Diagnosis not present

## 2015-03-05 LAB — BASIC METABOLIC PANEL
Anion gap: 6 (ref 5–15)
BUN: 15 mg/dL (ref 6–20)
CO2: 31 mmol/L (ref 22–32)
Calcium: 9.1 mg/dL (ref 8.9–10.3)
Chloride: 103 mmol/L (ref 101–111)
Creatinine, Ser: 0.75 mg/dL (ref 0.61–1.24)
GFR calc Af Amer: >60 mL/min (ref >60)
GFR calc non Af Amer: >60 mL/min (ref >60)
Glucose, Bld: 150 mg/dL — ABNORMAL HIGH (ref 65–99)
Potassium: 4.2 mmol/L (ref 3.5–5.1)
Sodium: 140 mmol/L (ref 135–145)

## 2015-03-05 LAB — CBC
HCT: 33.3 % — ABNORMAL LOW (ref 40.0–52.0)
Hemoglobin: 11.1 g/dL — ABNORMAL LOW (ref 13.0–18.0)
MCH: 31.4 pg (ref 26.0–34.0)
MCHC: 33.2 g/dL (ref 32.0–36.0)
MCV: 94.4 fL (ref 80.0–100.0)
Platelets: 194 10*3/uL (ref 150–440)
RBC: 3.53 MIL/uL — ABNORMAL LOW (ref 4.40–5.90)
RDW: 12.9 % (ref 11.5–14.5)
WBC: 6.4 10*3/uL (ref 3.8–10.6)

## 2015-03-05 LAB — GLUCOSE, CAPILLARY
Glucose-Capillary: 183 mg/dL — ABNORMAL HIGH (ref 65–99)
Glucose-Capillary: 133 mg/dL — ABNORMAL HIGH (ref 65–99)
Glucose-Capillary: 134 mg/dL — ABNORMAL HIGH (ref 65–99)

## 2015-03-05 LAB — HEMOGLOBIN A1C: Hgb A1c MFr Bld: 6.2 % — ABNORMAL HIGH (ref 4.0–6.0)

## 2015-03-05 LAB — TSH: TSH: 1.003 u[IU]/mL (ref 0.350–4.500)

## 2015-03-05 LAB — TROPONIN I
Troponin I: <0.03 ng/mL (ref <0.031)
Troponin I: 0.03 ng/mL (ref <0.031)

## 2015-03-05 MED ORDER — ASPIRIN EC 81 MG PO TBEC
81.0000 mg | DELAYED_RELEASE_TABLET | Freq: Every day | ORAL | Status: DC
  Administered 2015-03-05: 81 mg via ORAL
  Filled 2015-03-05: qty 1

## 2015-03-05 MED ORDER — ONDANSETRON HCL 4 MG PO TABS
4.0000 mg | ORAL_TABLET | Freq: Four times a day (QID) | ORAL | Status: DC | PRN
Start: 2015-03-05 — End: 2015-03-05

## 2015-03-05 MED ORDER — RAMIPRIL 5 MG PO CAPS
10.0000 mg | ORAL_CAPSULE | Freq: Every day | ORAL | Status: DC
  Administered 2015-03-05: 10 mg via ORAL
  Filled 2015-03-05: qty 2

## 2015-03-05 MED ORDER — DARIFENACIN HYDROBROMIDE ER 7.5 MG PO TB24
7.5000 mg | ORAL_TABLET | Freq: Every day | ORAL | Status: DC
  Administered 2015-03-05: 7.5 mg via ORAL
  Filled 2015-03-05 (×2): qty 1

## 2015-03-05 MED ORDER — LABETALOL HCL 5 MG/ML IV SOLN
10.0000 mg | INTRAVENOUS | Status: DC | PRN
  Administered 2015-03-05: 10 mg via INTRAVENOUS
  Filled 2015-03-05: qty 4

## 2015-03-05 MED ORDER — SENNOSIDES-DOCUSATE SODIUM 8.6-50 MG PO TABS
1.0000 | ORAL_TABLET | Freq: Every evening | ORAL | Status: DC | PRN
  Administered 2015-03-05: 1 via ORAL
  Filled 2015-03-05: qty 1

## 2015-03-05 MED ORDER — INSULIN ASPART 100 UNIT/ML ~~LOC~~ SOLN
0.0000 [IU] | Freq: Three times a day (TID) | SUBCUTANEOUS | Status: DC
  Administered 2015-03-05: 1 [IU] via SUBCUTANEOUS
  Filled 2015-03-05: qty 1

## 2015-03-05 MED ORDER — AMLODIPINE BESYLATE 5 MG PO TABS
5.0000 mg | ORAL_TABLET | Freq: Every day | ORAL | Status: DC
  Administered 2015-03-05: 5 mg via ORAL
  Filled 2015-03-05: qty 1

## 2015-03-05 MED ORDER — PROPRANOLOL HCL 40 MG PO TABS
60.0000 mg | ORAL_TABLET | Freq: Once | ORAL | Status: AC
  Administered 2015-03-05: 60 mg via ORAL
  Filled 2015-03-05: qty 2

## 2015-03-05 MED ORDER — METFORMIN HCL 500 MG PO TABS
500.0000 mg | ORAL_TABLET | Freq: Two times a day (BID) | ORAL | Status: DC
Start: 2015-03-05 — End: 2015-03-05
  Administered 2015-03-05: 500 mg via ORAL
  Filled 2015-03-05: qty 1

## 2015-03-05 MED ORDER — INFLUENZA VAC SPLIT QUAD 0.5 ML IM SUSY: 0.5000 mL | PREFILLED_SYRINGE | INTRAMUSCULAR | Status: DC

## 2015-03-05 MED ORDER — AMLODIPINE BESYLATE 5 MG PO TABS: 5.0000 mg | ORAL_TABLET | Freq: Every day | ORAL | Status: DC

## 2015-03-05 MED ORDER — INFLUENZA VAC SPLIT QUAD 0.5 ML IM SUSY
0.5000 mL | PREFILLED_SYRINGE | Freq: Once | INTRAMUSCULAR | Status: AC
  Administered 2015-03-05: 0.5 mL via INTRAMUSCULAR
  Filled 2015-03-05: qty 0.5

## 2015-03-05 MED ORDER — INSULIN ASPART 100 UNIT/ML ~~LOC~~ SOLN: 0.0000 [IU] | Freq: Every day | SUBCUTANEOUS | Status: DC

## 2015-03-05 MED ORDER — LATANOPROST 0.005 % OP SOLN
1.0000 [drp] | Freq: Every day | OPHTHALMIC | Status: DC
  Administered 2015-03-05: 1 [drp] via OPHTHALMIC
  Filled 2015-03-05: qty 2.5

## 2015-03-05 MED ORDER — ACETAMINOPHEN 650 MG RE SUPP: 650.0000 mg | Freq: Four times a day (QID) | RECTAL | Status: DC | PRN

## 2015-03-05 MED ORDER — PANTOPRAZOLE SODIUM 40 MG PO TBEC
40.0000 mg | DELAYED_RELEASE_TABLET | Freq: Every day | ORAL | Status: DC
  Administered 2015-03-05: 40 mg via ORAL
  Filled 2015-03-05: qty 1

## 2015-03-05 MED ORDER — ONDANSETRON HCL 4 MG/2ML IJ SOLN: 4.0000 mg | Freq: Four times a day (QID) | INTRAMUSCULAR | Status: DC | PRN

## 2015-03-05 MED ORDER — HYDROCODONE-ACETAMINOPHEN 5-325 MG PO TABS: 1.0000 | ORAL_TABLET | Freq: Two times a day (BID) | ORAL | Status: DC | PRN

## 2015-03-05 MED ORDER — HYDRALAZINE HCL 20 MG/ML IJ SOLN: 10.0000 mg | INTRAMUSCULAR | Status: DC | PRN

## 2015-03-05 MED ORDER — CLONAZEPAM 0.5 MG PO TABS
0.5000 mg | ORAL_TABLET | Freq: Two times a day (BID) | ORAL | Status: DC
Start: 2015-03-05 — End: 2015-03-05
  Administered 2015-03-05 (×2): 0.5 mg via ORAL
  Filled 2015-03-05 (×2): qty 1

## 2015-03-05 MED ORDER — ACETAMINOPHEN 325 MG PO TABS: 650.0000 mg | ORAL_TABLET | Freq: Four times a day (QID) | ORAL | Status: DC | PRN

## 2015-03-05 MED ORDER — DULOXETINE HCL 30 MG PO CPEP
30.0000 mg | ORAL_CAPSULE | Freq: Every day | ORAL | Status: DC
  Administered 2015-03-05: 30 mg via ORAL
  Filled 2015-03-05: qty 1

## 2015-03-05 MED ORDER — GABAPENTIN 100 MG PO CAPS
100.0000 mg | ORAL_CAPSULE | Freq: Two times a day (BID) | ORAL | Status: DC
  Administered 2015-03-05 (×2): 100 mg via ORAL
  Filled 2015-03-05 (×2): qty 1

## 2015-03-05 MED ORDER — ENOXAPARIN SODIUM 40 MG/0.4ML ~~LOC~~ SOLN
40.0000 mg | Freq: Every day | SUBCUTANEOUS | Status: DC
  Administered 2015-03-05: 40 mg via SUBCUTANEOUS
  Filled 2015-03-05: qty 0.4

## 2015-03-05 NOTE — Progress Notes (Signed)
*  PRELIMINARY RESULTS* Echocardiogram 2D Echocardiogram has been performed.  Todd [name] 03/05/2015, 10:04 AM

## 2015-03-05 NOTE — Progress Notes (Signed)
Patient d/c'd home. Education provided, no questions at this time. Patient to be picked up by son. Telemetry removed. Brandi R Mansfield  

## 2015-03-05 NOTE — Discharge Summary (Signed)
[facility] NAME: Todd [name]    MR#:  [id number]  DATE OF BIRTH:  19-Feb-1931  DATE OF ADMISSION:  03/04/2015 ADMITTING PHYSICIAN: Lance Coon, MD  DATE OF DISCHARGE: 03/05/2015 12:06 PM  PRIMARY CARE PHYSICIAN: Golden Pop, MD    ADMISSION DIAGNOSIS:  Weakness generalized [R53.1] Exertional dyspnea [R06.09]  DISCHARGE DIAGNOSIS:  Principal Problem:   Accelerated hypertension Active Problems:   Diabetes mellitus without complication   Depression   GERD (gastroesophageal reflux disease)   Anxiety   DOE (dyspnea on exertion)   SECONDARY DIAGNOSIS:   Past Medical History  Diagnosis Date  . Hypertension   . Diabetes mellitus without complication   . Prostate cancer   . Hyperlipidemia   . Lumbago     Lumbosacral Neuritis  . Depression   . Anxiety   . CAD (coronary artery disease)   . Iron deficiency anemia due to chronic blood loss   . GERD (gastroesophageal reflux disease)   . Glaucoma   . Arthritis     osteoarthritis-left knee  . Left heart failure   . Aortic regurgitation     HOSPITAL COURSE:   81 year old male presented with progressive fatigue and dyspnea exertion and found to have Accelerated hypertension. For further details please refer to history of present illness.  1. Accelerated hypertension: Patient was restarted and his outpatient medications. I also added low-dose Norvasc to help with his blood pressure. His blood pressure significantly improved.  2. Shortness of breath/dyspnea exertion: His echocardiogram showed a near normal ejection fraction with no valvular abnormalities. I suspect is related to his accelerated hypertension. Chest x-ray did not show evidence of pneumonia or heart failure. Cardiac enzymes were normal. Patient was asymptomatic once his blood pressure actually improved.  3. Diabetes type 2 with a couple patient: Patient will continue on outpatient medications including  metformin.  4. Depression: Continue Cymbalta.      DISCHARGE CONDITIONS AND DIET:  Patient's been discharged home in stable condition on a heart healthy diabetic diet  CONSULTS OBTAINED:     DRUG ALLERGIES:   Allergies  Allergen Reactions  . Fluoxetine     headache  . Meloxicam Other (See Comments)    Other reaction(s): Dizziness Nervousness.     DISCHARGE MEDICATIONS:   Discharge Medication List as of 03/05/2015 11:35 AM    START taking these medications   Details  amLODipine (NORVASC) 5 MG tablet Take 1 tablet (5 mg total) by mouth daily., Starting 03/05/2015, Until Discontinued, Normal      CONTINUE these medications which have NOT CHANGED   Details  Alpha-Lipoic Acid 200 MG CAPS Take 200 mg by mouth daily., Until Discontinued, Historical Med    aspirin 81 MG tablet Take 81 mg by mouth daily., Until Discontinued, Historical Med    clonazePAM (KLONOPIN) 0.5 MG tablet Take by mouth., Starting 10/28/2014, Until Discontinued, Historical Med    DULoxetine (CYMBALTA) 30 MG capsule Take 30 mg by mouth daily., Until Discontinued, Historical Med    gabapentin (NEURONTIN) 100 MG capsule Take 100 mg by mouth 2 (two) times daily., Until Discontinued, Historical Med    metFORMIN (GLUCOPHAGE) 500 MG tablet Take by mouth., Until Discontinued, Historical Med    omeprazole (PRILOSEC) 20 MG capsule Take 20 mg by mouth daily., Until Discontinued, Historical Med    propranolol (INDERAL) 60 MG tablet Take 60 mg by mouth once., Historical Med    ramipril (ALTACE) 10 MG capsule Take 1 capsule (10  mg total) by mouth daily., Starting 12/02/2014, Until Discontinued, Normal    HYDROcodone-acetaminophen (NORCO/VICODIN) 5-325 MG per tablet 1 po bid prn, Historical Med    primidone (MYSOLINE) 50 MG tablet Take by mouth 2 (two) times daily., Until Discontinued, Historical Med    solifenacin (VESICARE) 5 MG tablet Take 5 mg by mouth daily., Until Discontinued, Historical Med     Travoprost, BAK Free, (TRAVATAN) 0.004 % SOLN ophthalmic solution Place 1 drop into both eyes at bedtime., Until Discontinued, Historical Med              Today   CHIEF COMPLAINT:  Patient is doing well this morning. Patient has no complaints of chest pain and shortness of breath.   VITAL SIGNS:  Blood pressure 160/58, pulse 58, temperature 97.4 F (36.3 C), temperature source Oral, resp. rate 18, height 5\' 8"  (1.727 m), weight 80.695 kg (177 lb 14.4 oz), SpO2 100 %.   REVIEW OF SYSTEMS:  Review of Systems  Constitutional: Negative for fever, chills and malaise/fatigue.  HENT: Negative for sore throat.   Eyes: Negative for blurred vision.  Respiratory: Negative for cough, hemoptysis, shortness of breath and wheezing.   Cardiovascular: Negative for chest pain, palpitations and leg swelling.  Gastrointestinal: Negative for nausea, vomiting, abdominal pain, diarrhea and blood in stool.  Genitourinary: Negative for dysuria.  Musculoskeletal: Negative for back pain.  Neurological: Negative for dizziness, tremors and headaches.  Endo/Heme/Allergies: Does not bruise/bleed easily.     PHYSICAL EXAMINATION:  GENERAL:  81 y.o.-year-old patient lying in the bed with no acute distress.  NECK:  Supple, no jugular venous distention. No thyroid enlargement, no tenderness.  LUNGS: Normal breath sounds bilaterally, no wheezing, rales,rhonchi  No use of accessory muscles of respiration.  CARDIOVASCULAR: S1, S2 normal. No murmurs, rubs, or gallops.  ABDOMEN: Soft, non-tender, non-distended. Bowel sounds present. No organomegaly or mass.  EXTREMITIES: No pedal edema, cyanosis, or clubbing.  PSYCHIATRIC: The patient is alert and oriented x 3.  SKIN: No obvious rash, lesion, or ulcer.   DATA REVIEW:   CBC  Recent Labs Lab 03/05/15 0634  WBC 6.4  HGB 11.1*  HCT 33.3*  PLT 194    Chemistries   Recent Labs Lab 03/04/15 2014 03/05/15 0634  NA 142 140  K 4.4 4.2  CL 103  103  CO2 30 31  GLUCOSE 144* 150*  BUN 19 15  CREATININE 0.70 0.75  CALCIUM 9.5 9.1  AST 21  --   ALT 10*  --   ALKPHOS 90  --   BILITOT 0.4  --     Cardiac Enzymes  Recent Labs Lab 03/04/15 2014 03/05/15 0209 03/05/15 0634  TROPONINI <0.03 <0.03 0.03    Microbiology Results  @[email or web address] @  RADIOLOGY:  Dg Chest 2 View  03/04/2015   CLINICAL DATA:  Shortness of breath and weakness for 1 week.  EXAM: CHEST  2 VIEW  COMPARISON:  September 25, 2005  FINDINGS: The heart size and mediastinal contours are within normal limits. There is no focal infiltrate, pulmonary edema, or pleural effusion. There is mild chronic elevation of right hemidiaphragm. Degenerative joint changes of the spine are identified.  IMPRESSION: No active cardiopulmonary disease.   Electronically Signed   By: Abelardo Diesel M.D.   On: 03/04/2015 20:09      Management plans discussed with the patient and he is in agreement. Stable for discharge home  Patient should follow up with PCP in one week  CODE STATUS:  Code Status Orders        Start     Ordered   03/05/15 0020  Full code   Continuous     03/05/15 0019      TOTAL TIME TAKING CARE OF THIS PATIENT: 35 minutes.    MODY, SITAL M.D on 03/05/2015 at 12:33 PM  Between 7am to 6pm - Pager - [phone] After 6pm go to [email or web address] - password [email or web address] [facility]  Office  [phone]  CC: Primary care physician; Golden Pop, MD

## 2015-03-05 NOTE — Plan of Care (Signed)
Problem: Phase I Progression Outcomes Goal: Pain controlled with appropriate interventions Outcome: Completed/Met Date Met:  03/05/15 No C/O pain noted on shift will continue to monitor

## 2015-03-05 NOTE — Care Management (Signed)
Patient for discharge home after observation stay for hypertension.  Denies issues obtaining medications and accessing medical care.  Patient has multiple family members close by and informed that there are no discharge concerns.

## 2015-03-05 NOTE — Plan of Care (Signed)
Problem: Phase I Progression Outcomes Goal: Hemodynamically stable Outcome: Progressing BP has been elevated, medication given to reduce BP.

## 2015-03-08 ENCOUNTER — Ambulatory Visit: Payer: Self-pay | Admitting: Family Medicine

## 2015-03-09 ENCOUNTER — Telehealth: Payer: Self-pay

## 2015-03-09 NOTE — Telephone Encounter (Signed)
[address] is requesting refill for Propranolol ER 60MG  caps 1QD #90

## 2015-03-10 MED ORDER — PROPRANOLOL HCL 60 MG PO TABS: 60.0000 mg | ORAL_TABLET | Freq: Once | ORAL | Status: DC

## 2015-03-23 ENCOUNTER — Ambulatory Visit: Payer: Self-pay | Admitting: Urology

## 2015-03-24 ENCOUNTER — Ambulatory Visit: Payer: Self-pay | Admitting: Family Medicine

## 2015-03-24 DIAGNOSIS — M79674 Pain in right toe(s): Secondary | ICD-10-CM | POA: Diagnosis not present

## 2015-03-24 DIAGNOSIS — M79675 Pain in left toe(s): Secondary | ICD-10-CM | POA: Diagnosis not present

## 2015-03-24 DIAGNOSIS — B351 Tinea unguium: Secondary | ICD-10-CM | POA: Diagnosis not present

## 2015-03-31 ENCOUNTER — Ambulatory Visit: Payer: Self-pay | Admitting: Family Medicine

## 2015-04-01 ENCOUNTER — Ambulatory Visit (INDEPENDENT_AMBULATORY_CARE_PROVIDER_SITE_OTHER): Payer: Medicare Other | Admitting: Family Medicine

## 2015-04-01 ENCOUNTER — Encounter: Payer: Self-pay | Admitting: Family Medicine

## 2015-04-01 VITALS — BP 156/75 | HR 61 | Temp 97.7°F | Ht 65.7 in | Wt 176.0 lb

## 2015-04-01 DIAGNOSIS — Z23 Encounter for immunization: Secondary | ICD-10-CM | POA: Diagnosis not present

## 2015-04-01 DIAGNOSIS — I10 Essential (primary) hypertension: Secondary | ICD-10-CM | POA: Diagnosis not present

## 2015-04-01 DIAGNOSIS — E119 Type 2 diabetes mellitus without complications: Secondary | ICD-10-CM

## 2015-04-01 DIAGNOSIS — F329 Major depressive disorder, single episode, unspecified: Secondary | ICD-10-CM | POA: Diagnosis not present

## 2015-04-01 DIAGNOSIS — F32A Depression, unspecified: Secondary | ICD-10-CM

## 2015-04-01 MED ORDER — AMLODIPINE BESYLATE 5 MG PO TABS: 5.0000 mg | ORAL_TABLET | Freq: Every day | ORAL | Status: DC

## 2015-04-01 NOTE — Assessment & Plan Note (Signed)
The current medical regimen is effective;  continue present plan and medications.  

## 2015-04-01 NOTE — Addendum Note (Signed)
Addended by: Wynn Maudlin on: 04/01/2015 04:57 PM   Modules accepted: Orders, SmartSet

## 2015-04-01 NOTE — Progress Notes (Signed)
BP 156/75 mmHg  Pulse 61  Temp(Src) 97.7 F (36.5 C)  Ht 5' 5.7" (1.669 m)  Wt 176 lb (79.833 kg)  BMI 28.66 kg/m2  SpO2 95%   Subjective:    Patient ID: Todd [name], male    DOB: 1931/06/01, 81 y.o.   MRN: [id number]  HPI: Todd [name] is a 81 y.o. male  Chief Complaint  Patient presents with  . Hypertension   patient accompanied by his daughter who assists with history Patient all in all doing well not having any complaints from medications which is taken faithfully Patient complaining of some generalized leg weakness but on review especially with daughter patient doing a lot of sitting with very little walking. Patient does have a cane has not fallen Patient's anxiety depression seem to be well controlled  Relevant past medical, surgical, family and social history reviewed and updated as indicated. Interim medical history since our last visit reviewed. Allergies and medications reviewed and updated.  Review of Systems  Constitutional: Negative.   Respiratory: Negative.   Cardiovascular: Negative.     Per HPI unless specifically indicated above     Objective:    BP 156/75 mmHg  Pulse 61  Temp(Src) 97.7 F (36.5 C)  Ht 5' 5.7" (1.669 m)  Wt 176 lb (79.833 kg)  BMI 28.66 kg/m2  SpO2 95%  Wt Readings from Last 3 Encounters:  04/01/15 176 lb (79.833 kg)  03/05/15 177 lb 14.4 oz (80.695 kg)  03/01/15 180 lb 12.8 oz (82.01 kg)    Physical Exam  Constitutional: He is oriented to person, place, and time. He appears well-developed and well-nourished. No distress.  HENT:  Head: Normocephalic and atraumatic.  Right Ear: Hearing normal.  Left Ear: Hearing normal.  Nose: Nose normal.  Eyes: Conjunctivae and lids are normal. Right eye exhibits no discharge. Left eye exhibits no discharge. No scleral icterus.  Cardiovascular: Normal rate, regular rhythm and normal heart sounds.   Pulmonary/Chest: Effort normal and breath sounds normal. No respiratory distress.   Musculoskeletal: Normal range of motion.  Neurological: He is alert and oriented to person, place, and time.  Skin: Skin is intact. No rash noted.  Psychiatric: He has a normal mood and affect. His speech is normal and behavior is normal. Judgment and thought content normal. Cognition and memory are normal.    Results for orders placed or performed during the hospital encounter of 03/04/15  CBC  Result Value Ref Range   WBC 6.6 3.8 - 10.6 K/uL   RBC 3.90 (L) 4.40 - 5.90 MIL/uL   Hemoglobin 12.5 (L) 13.0 - 18.0 g/dL   HCT 37.6 (L) 40.0 - 52.0 %   MCV 96.4 80.0 - 100.0 fL   MCH 32.0 26.0 - 34.0 pg   MCHC 33.2 32.0 - 36.0 g/dL   RDW 12.8 11.5 - 14.5 %   Platelets 221 150 - 440 K/uL  Comprehensive metabolic panel  Result Value Ref Range   Sodium 142 135 - 145 mmol/L   Potassium 4.4 3.5 - 5.1 mmol/L   Chloride 103 101 - 111 mmol/L   CO2 30 22 - 32 mmol/L   Glucose, Bld 144 (H) 65 - 99 mg/dL   BUN 19 6 - 20 mg/dL   Creatinine, Ser 0.70 0.61 - 1.24 mg/dL   Calcium 9.5 8.9 - 10.3 mg/dL   Total Protein 7.4 6.5 - 8.1 g/dL   Albumin 4.4 3.5 - 5.0 g/dL   AST 21 15 - 41 U/L   ALT  10 (L) 17 - 63 U/L   Alkaline Phosphatase 90 38 - 126 U/L   Total Bilirubin 0.4 0.3 - 1.2 mg/dL   GFR calc non Af Amer >60 >60 mL/min   GFR calc Af Amer >60 >60 mL/min   Anion gap 9 5 - 15  Troponin I  Result Value Ref Range   Troponin I <0.03 <0.031 ng/mL  Brain natriuretic peptide  Result Value Ref Range   B Natriuretic Peptide 234.0 (H) 0.0 - 100.0 pg/mL  Urinalysis complete, with microscopic ([facility] only)  Result Value Ref Range   Color, Urine YELLOW (A) YELLOW   APPearance CLEAR (A) CLEAR   Glucose, UA 50 (A) NEGATIVE mg/dL   Bilirubin Urine NEGATIVE NEGATIVE   Ketones, ur NEGATIVE NEGATIVE mg/dL   Specific Gravity, Urine 1.019 1.005 - 1.030   Hgb urine dipstick NEGATIVE NEGATIVE   pH 6.0 5.0 - 8.0   Protein, ur NEGATIVE NEGATIVE mg/dL   Nitrite NEGATIVE NEGATIVE   Leukocytes, UA NEGATIVE  NEGATIVE   RBC / HPF 0-5 0 - 5 RBC/hpf   WBC, UA 0-5 0 - 5 WBC/hpf   Bacteria, UA NONE SEEN NONE SEEN   Squamous Epithelial / LPF NONE SEEN NONE SEEN   Mucous PRESENT   TSH  Result Value Ref Range   TSH 1.003 0.350 - 4.500 uIU/mL  Hemoglobin A1c  Result Value Ref Range   Hgb A1c MFr Bld 6.2 (H) 4.0 - 6.0 %  Troponin I  Result Value Ref Range   Troponin I <0.03 <0.031 ng/mL  Troponin I  Result Value Ref Range   Troponin I 0.03 <0.031 ng/mL  Basic metabolic panel  Result Value Ref Range   Sodium 140 135 - 145 mmol/L   Potassium 4.2 3.5 - 5.1 mmol/L   Chloride 103 101 - 111 mmol/L   CO2 31 22 - 32 mmol/L   Glucose, Bld 150 (H) 65 - 99 mg/dL   BUN 15 6 - 20 mg/dL   Creatinine, Ser 0.75 0.61 - 1.24 mg/dL   Calcium 9.1 8.9 - 10.3 mg/dL   GFR calc non Af Amer >60 >60 mL/min   GFR calc Af Amer >60 >60 mL/min   Anion gap 6 5 - 15  CBC  Result Value Ref Range   WBC 6.4 3.8 - 10.6 K/uL   RBC 3.53 (L) 4.40 - 5.90 MIL/uL   Hemoglobin 11.1 (L) 13.0 - 18.0 g/dL   HCT 33.3 (L) 40.0 - 52.0 %   MCV 94.4 80.0 - 100.0 fL   MCH 31.4 26.0 - 34.0 pg   MCHC 33.2 32.0 - 36.0 g/dL   RDW 12.9 11.5 - 14.5 %   Platelets 194 150 - 440 K/uL  Glucose, capillary  Result Value Ref Range   Glucose-Capillary 183 (H) 65 - 99 mg/dL  Glucose, capillary  Result Value Ref Range   Glucose-Capillary 133 (H) 65 - 99 mg/dL  Glucose, capillary  Result Value Ref Range   Glucose-Capillary 134 (H) 65 - 99 mg/dL      Assessment & Plan:   Problem List Items Addressed This Visit      Cardiovascular and Mediastinum   Hypertension    Currently stable on current medications with no side effects      Relevant Medications   amLODipine (NORVASC) 5 MG tablet     Endocrine   Diabetes mellitus without complication (HCC)    The current medical regimen is effective;  continue present plan and medications.  Other   Depression    The current medical regimen is effective;  continue present plan and  medications.        Other Visit Diagnoses    Essential hypertension, benign    -  Primary    Relevant Medications    amLODipine (NORVASC) 5 MG tablet    Other Relevant Orders    Basic metabolic panel        Follow up plan: Return in about 2 months (around 06/01/2015), or if symptoms worsen or fail to improve, for For med check and hemoglobin A1c.

## 2015-04-01 NOTE — Assessment & Plan Note (Signed)
Currently stable on current medications with no side effects

## 2015-04-02 LAB — BASIC METABOLIC PANEL
BUN/Creatinine Ratio: 18 (ref 10–22)
BUN: 15 mg/dL (ref 8–27)
CO2: 29 mmol/L (ref 18–29)
Calcium: 9.3 mg/dL (ref 8.6–10.2)
Chloride: 100 mmol/L (ref 97–106)
Creatinine, Ser: 0.85 mg/dL (ref 0.76–1.27)
GFR calc Af Amer: 94 mL/min/{1.73_m2} (ref >59)
GFR calc non Af Amer: 81 mL/min/{1.73_m2} (ref >59)
Glucose: 142 mg/dL — ABNORMAL HIGH (ref 65–99)
Potassium: 4.1 mmol/L (ref 3.5–5.2)
Sodium: 142 mmol/L (ref 136–144)

## 2015-04-05 ENCOUNTER — Encounter: Payer: Self-pay | Admitting: Family Medicine

## 2015-04-07 ENCOUNTER — Ambulatory Visit (INDEPENDENT_AMBULATORY_CARE_PROVIDER_SITE_OTHER): Payer: Medicare Other | Admitting: Urology

## 2015-04-07 ENCOUNTER — Ambulatory Visit: Payer: Self-pay | Admitting: Urology

## 2015-04-07 ENCOUNTER — Encounter: Payer: Self-pay | Admitting: Urology

## 2015-04-07 VITALS — BP 179/71 | HR 69 | Ht 68.0 in | Wt 180.6 lb

## 2015-04-07 DIAGNOSIS — R32 Unspecified urinary incontinence: Secondary | ICD-10-CM | POA: Diagnosis not present

## 2015-04-07 DIAGNOSIS — C61 Malignant neoplasm of prostate: Secondary | ICD-10-CM | POA: Diagnosis not present

## 2015-04-07 DIAGNOSIS — N3943 Post-void dribbling: Secondary | ICD-10-CM

## 2015-04-07 DIAGNOSIS — F320 Major depressive disorder, single episode, mild: Secondary | ICD-10-CM | POA: Insufficient documentation

## 2015-04-07 DIAGNOSIS — F411 Generalized anxiety disorder: Secondary | ICD-10-CM | POA: Insufficient documentation

## 2015-04-07 DIAGNOSIS — Z8639 Personal history of other endocrine, nutritional and metabolic disease: Secondary | ICD-10-CM | POA: Insufficient documentation

## 2015-04-07 DIAGNOSIS — Z9229 Personal history of other drug therapy: Secondary | ICD-10-CM | POA: Insufficient documentation

## 2015-04-07 DIAGNOSIS — Z8669 Personal history of other diseases of the nervous system and sense organs: Secondary | ICD-10-CM | POA: Insufficient documentation

## 2015-04-07 MED ORDER — LEUPROLIDE ACETATE (6 MONTH) 45 MG IM KIT
45.0000 mg | PACK | Freq: Once | INTRAMUSCULAR | Status: AC
Start: 2015-04-07 — End: 2015-04-07
  Administered 2015-04-07: 45 mg via INTRAMUSCULAR

## 2015-04-07 NOTE — Progress Notes (Signed)
Lupron IM Injection   Due to Prostate Cancer patient is present today for a Lupron Injection.  Medication: Lupron 6 month Dose: 45 mg  Location: left upper outer buttocks Lot: [id number] Exp: 08/22/2016  Patient tolerated well, no complications were noted  Performed by: Vickki Hearing  Follow-up- 6 mths

## 2015-04-07 NOTE — Progress Notes (Signed)
04/07/2015 3:10 PM   Todd [name] 1931/03/23 [id number]  Referring provider: Guadalupe Maple, MD [address] [place], [place] [address]  Chief Complaint  Patient presents with  . Prostate Cancer    HPI: history prostate cancer. Postvoid dribbling within a minute after voiding. Plan cystoscopy make sure he does not have a urethral stricture. Secondarily is on Lupron injections with no complaint of chills. If the PSA continues to rise from its 2.4 level despite Lupron I will obtain a bone scan CT of the abdomen. Lupron given today. Follow-up within the next month    PMH: Past Medical History  Diagnosis Date  . Hypertension   . Diabetes mellitus without complication ([place])   . Prostate cancer ([place])   . Hyperlipidemia   . Lumbago     Lumbosacral Neuritis  . Depression   . Anxiety   . CAD (coronary artery disease)   . Iron deficiency anemia due to chronic blood loss   . GERD (gastroesophageal reflux disease)   . Glaucoma   . Arthritis     osteoarthritis-left knee  . Left heart failure ([place])   . Aortic regurgitation   . ED (erectile dysfunction)   . Accelerated hypertension 03/04/2015    Surgical History: Past Surgical History  Procedure Laterality Date  . Replacement total knee bilateral    . Carpal tunnel release      Home Medications:    Medication List       This list is accurate as of: 04/07/15  3:10 PM.  Always use your most recent med list.               Alpha-Lipoic Acid 200 MG Caps  Take 200 mg by mouth daily.     amLODipine 5 MG tablet  Commonly known as:  NORVASC  Take 1 tablet (5 mg total) by mouth daily.     aspirin 81 MG tablet  Take 81 mg by mouth daily.     clonazePAM 0.5 MG tablet  Commonly known as:  KLONOPIN  Take by mouth.     DULoxetine 30 MG capsule  Commonly known as:  CYMBALTA  Take 30 mg by mouth daily.     gabapentin 100 MG capsule  Commonly known as:  NEURONTIN  Take 100 mg by mouth 2 (two) times daily.     HYDROcodone-acetaminophen 5-325 MG tablet  Commonly known as:  NORCO/VICODIN  1 po bid prn     metFORMIN 500 MG tablet  Commonly known as:  GLUCOPHAGE  Take by mouth.     omeprazole 20 MG capsule  Commonly known as:  PRILOSEC  Take 20 mg by mouth daily.     primidone 50 MG tablet  Commonly known as:  MYSOLINE  Take by mouth 2 (two) times daily.     propranolol 60 MG tablet  Commonly known as:  INDERAL  Take 1 tablet (60 mg total) by mouth once.     ramipril 10 MG capsule  Commonly known as:  ALTACE  Take 1 capsule (10 mg total) by mouth daily.     solifenacin 5 MG tablet  Commonly known as:  VESICARE  Take 5 mg by mouth daily.     Travoprost (BAK Free) 0.004 % Soln ophthalmic solution  Commonly known as:  TRAVATAN  Place 1 drop into both eyes at bedtime.        Allergies:  Allergies  Allergen Reactions  . Fluoxetine     headache  . Meloxicam Other (  See Comments)    Other reaction(s): Dizziness Nervousness.     Family History: Family History  Problem Relation Age of Onset  . Stroke Mother   . Hypertension Mother   . Hypertension Father   . Stroke Sister   . Prostate cancer Neg Hx     Social History:  reports that he quit smoking about 20 years ago. He has never used smokeless tobacco. He reports that he drinks about 0.6 oz of alcohol per week. He reports that he does not use illicit drugs.  ROS: UROLOGY Frequent Urination?: Yes Hard to postpone urination?: No Burning/pain with urination?: No Get up at night to urinate?: Yes Leakage of urine?: No Urine stream starts and stops?: No Trouble starting stream?: No Do you have to strain to urinate?: No Blood in urine?: No Urinary tract infection?: No Sexually transmitted disease?: No Injury to kidneys or bladder?: No Painful intercourse?: No Weak stream?: No Erection problems?: No Penile pain?: No  Gastrointestinal Nausea?: No Vomiting?: No Indigestion/heartburn?: No Diarrhea?:  No Constipation?: No  Constitutional Fever: No Night sweats?: No Weight loss?: No Fatigue?: No  Skin Skin rash/lesions?: No Itching?: No  Eyes Blurred vision?: No Double vision?: No  Ears/Nose/Throat Sore throat?: No Sinus problems?: No  Hematologic/Lymphatic Swollen glands?: No Easy bruising?: No  Cardiovascular Leg swelling?: No Chest pain?: No  Respiratory Cough?: No Shortness of breath?: No  Endocrine Excessive thirst?: No  Musculoskeletal Back pain?: Yes Joint pain?: Yes  Neurological Headaches?: No Dizziness?: No  Psychologic Depression?: Yes Anxiety?: Yes  Physical Exam: BP 179/71 mmHg  Pulse 69  Ht 5\' 8"  (1.727 m)  Wt 180 lb 9.6 oz (81.92 kg)  BMI 27.47 kg/m2  Constitutional:  Alert and oriented, No acute distress. HEENT: [place] AT, moist mucus membranes.  Trachea midline, no masses. Cardiovascular: No clubbing, cyanosis, or edema. Respiratory: Normal respiratory effort, no increased work of breathing. GI: Abdomen is soft, nontender, nondistended, no abdominal masses GU: No CVA tenderness. noncircumcised scrotal hemangiomas normal testicles prostate non-palpable external sphincter normal Skin: No rashes, bruises or suspicious lesions. Lymph: No cervical or inguinal adenopathy. Neurologic: Grossly intact, no focal deficits, moving all 4 extremities. Psychiatric: Normal mood and affect.  Laboratory Data: Lab Results  Component Value Date   WBC 6.4 03/05/2015   HGB 11.1* 03/05/2015   HCT 33.3* 03/05/2015   MCV 94.4 03/05/2015   PLT 194 03/05/2015    Lab Results  Component Value Date   CREATININE 0.85 04/01/2015    No results found for: PSA  No results found for: TESTOSTERONE  Lab Results  Component Value Date   HGBA1C 6.2* 03/05/2015    Urinalysis    Component Value Date/Time   COLORURINE YELLOW* 03/04/2015 2134   APPEARANCEUR CLEAR* 03/04/2015 2134   LABSPEC 1.019 03/04/2015 2134   PHURINE 6.0 03/04/2015 2134   GLUCOSEU  50* 03/04/2015 2134   HGBUR NEGATIVE 03/04/2015 2134   BILIRUBINUR NEGATIVE 03/04/2015 2134   BILIRUBINUR Negative 03/01/2015 1537   KETONESUR NEGATIVE 03/04/2015 2134   PROTEINUR NEGATIVE 03/04/2015 2134   NITRITE NEGATIVE 03/04/2015 2134   NITRITE Negative 03/01/2015 1537   LEUKOCYTESUR NEGATIVE 03/04/2015 2134   LEUKOCYTESUR Negative 03/01/2015 1537    Pertinent Imaging: none  Assessment & Plan:  Rising PSA post radiation therapy. Last PSA was 2.4 patient is now on his second 6 month Lupron injection. No local recurrence. PSA continues to rise despite Lupron obtain a bone scan. Secondary complaint is postvoid dribbling. He voids and then approximately a minute  later he has an expression of urine into his underwear after he has when his pants and underwear back on. So it's not immediately postvoid but within the first minutes at 2 minutes. This is usually indicative of a stricture Y have set him up to do a cystoscopy on him within the next 2 weeks. Quite embarrassing situation for. 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 Malignant neoplasm of prostate (HCC)  - PSA - Leuprolide Acetate (6 Month) (LUPRON) injection 45 mg; Inject 45 mg into the muscle once.  2. Incontinence  3. Urinary incontinence, post-void dribbling    No Follow-up on file.  Collier Flowers, [facility] [address], [place], [place] [address] [phone]

## 2015-04-08 DIAGNOSIS — M4807 Spinal stenosis, lumbosacral region: Secondary | ICD-10-CM | POA: Diagnosis not present

## 2015-04-08 DIAGNOSIS — M6281 Muscle weakness (generalized): Secondary | ICD-10-CM | POA: Diagnosis not present

## 2015-04-08 LAB — PLEASE NOTE

## 2015-04-08 LAB — PSA: Prostate Specific Ag, Serum: 0.3 ng/mL (ref 0.0–4.0)

## 2015-04-09 ENCOUNTER — Ambulatory Visit: Payer: Self-pay | Admitting: Urology

## 2015-04-22 ENCOUNTER — Other Ambulatory Visit: Payer: Self-pay

## 2015-04-22 ENCOUNTER — Other Ambulatory Visit: Payer: Self-pay | Admitting: Family Medicine

## 2015-04-22 DIAGNOSIS — G250 Essential tremor: Secondary | ICD-10-CM | POA: Diagnosis not present

## 2015-04-22 DIAGNOSIS — R4189 Other symptoms and signs involving cognitive functions and awareness: Secondary | ICD-10-CM | POA: Diagnosis not present

## 2015-04-22 DIAGNOSIS — R202 Paresthesia of skin: Secondary | ICD-10-CM | POA: Diagnosis not present

## 2015-04-27 ENCOUNTER — Encounter: Payer: Self-pay | Admitting: Urology

## 2015-04-27 ENCOUNTER — Ambulatory Visit (INDEPENDENT_AMBULATORY_CARE_PROVIDER_SITE_OTHER): Payer: Medicare Other | Admitting: Urology

## 2015-04-27 VITALS — BP 129/76 | HR 86 | Ht 67.0 in | Wt 178.5 lb

## 2015-04-27 DIAGNOSIS — N3943 Post-void dribbling: Secondary | ICD-10-CM

## 2015-04-27 DIAGNOSIS — R32 Unspecified urinary incontinence: Secondary | ICD-10-CM | POA: Insufficient documentation

## 2015-04-27 DIAGNOSIS — C61 Malignant neoplasm of prostate: Secondary | ICD-10-CM

## 2015-04-27 LAB — URINALYSIS, COMPLETE
Bilirubin, UA: NEGATIVE
Glucose, UA: NEGATIVE
Leukocytes, UA: NEGATIVE
Nitrite, UA: NEGATIVE
Protein, UA: NEGATIVE
RBC, UA: NEGATIVE
Specific Gravity, UA: 1.02 (ref 1.005–1.030)
Urobilinogen, Ur: 1 mg/dL (ref 0.2–1.0)
pH, UA: 5.5 (ref 5.0–7.5)

## 2015-04-27 LAB — MICROSCOPIC EXAMINATION: Bacteria, UA: NONE SEEN

## 2015-04-27 LAB — BLADDER SCAN AMB NON-IMAGING: Scan Result: 35

## 2015-04-27 MED ORDER — MIRABEGRON ER 50 MG PO TB24
25.0000 mg | ORAL_TABLET | Freq: Every day | ORAL | Status: DC
Start: 2015-04-27 — End: 2015-05-26

## 2015-04-27 MED ORDER — CIPROFLOXACIN HCL 500 MG PO TABS
500.0000 mg | ORAL_TABLET | Freq: Once | ORAL | Status: AC
  Administered 2015-04-27: 500 mg via ORAL

## 2015-04-27 MED ORDER — LIDOCAINE HCL 2 % EX GEL
1.0000 "application " | Freq: Once | CUTANEOUS | Status: AC
  Administered 2015-04-27: 1 via URETHRAL

## 2015-04-27 NOTE — Progress Notes (Signed)
04/27/2015 3:44 PM   Todd [name] 12/11/1930 [id number]  Referring provider: Guadalupe Maple, MD [address] [place], [place] [address]  Chief Complaint  Patient presents with  . Cysto    post-dribbling     HPI:  1 - Prostate Cancer - treated with primary radiation for unknown grade / stage disease years agoe, then biochemical recurrence.  Now on Lupron Q41monthly 04/2015 PSA 0.3 --> Lupron 45mg   2 - Lower Urinary Tract Symptoms / Urge Urinary Incontinence - Slowly progressive bother for urienary urgency with inctontinence. PVR 04/2015 "89mL". Tried Vesicare 5mg  daily but no significant improvement. Cysto 04/2015 without stricture / mass / radiation cystitis. Giving trial Myrbetriq. [name] Kitchen   PMH sig for CAD/DOE, DM2 with neuropathy, L-spine stenosis.   Today "Todd [name]" is seen for cysto and PVR to further eval for lower urinary tract symptoms.   PMH: Past Medical History  Diagnosis Date  . Hypertension   . Diabetes mellitus without complication ([place])   . Prostate cancer ([place])   . Hyperlipidemia   . Lumbago     Lumbosacral Neuritis  . Depression   . Anxiety   . CAD (coronary artery disease)   . Iron deficiency anemia due to chronic blood loss   . GERD (gastroesophageal reflux disease)   . Glaucoma   . Arthritis     osteoarthritis-left knee  . Left heart failure ([place])   . Aortic regurgitation   . ED (erectile dysfunction)   . Accelerated hypertension 03/04/2015    Surgical History: Past Surgical History  Procedure Laterality Date  . Replacement total knee bilateral    . Carpal tunnel release      Home Medications:    Medication List       This list is accurate as of: 04/27/15  3:44 PM.  Always use your most recent med list.               Alpha-Lipoic Acid 200 MG Caps  Take 200 mg by mouth daily.     amLODipine 5 MG tablet  Commonly known as:  NORVASC  Take 1 tablet (5 mg total) by mouth daily.     aspirin 81 MG tablet  Take 81 mg by mouth daily.     clonazePAM 0.5 MG tablet  Commonly known as:  KLONOPIN  TAKE 1 TABLET DAILY.     DULoxetine 30 MG capsule  Commonly known as:  CYMBALTA  Take 30 mg by mouth daily.     gabapentin 100 MG capsule  Commonly known as:  NEURONTIN  Take 100 mg by mouth 2 (two) times daily.     gabapentin 600 MG tablet  Commonly known as:  NEURONTIN     HYDROcodone-acetaminophen 5-325 MG tablet  Commonly known as:  NORCO/VICODIN  1 po bid prn     metFORMIN 500 MG tablet  Commonly known as:  GLUCOPHAGE  Take by mouth.     omeprazole 20 MG capsule  Commonly known as:  PRILOSEC  Take 20 mg by mouth daily.     primidone 50 MG tablet  Commonly known as:  MYSOLINE  Take by mouth 2 (two) times daily.     propranolol 60 MG tablet  Commonly known as:  INDERAL  Take 1 tablet (60 mg total) by mouth once.     ramipril 10 MG capsule  Commonly known as:  ALTACE  Take 1 capsule (10 mg total) by mouth daily.     solifenacin 5 MG tablet  Commonly known  as:  VESICARE  Take 5 mg by mouth daily.     Travoprost (BAK Free) 0.004 % Soln ophthalmic solution  Commonly known as:  TRAVATAN  Place 1 drop into both eyes at bedtime.        Allergies:  Allergies  Allergen Reactions  . Fluoxetine     headache  . Meloxicam Other (See Comments)    Other reaction(s): Dizziness Nervousness.     Family History: Family History  Problem Relation Age of Onset  . Stroke Mother   . Hypertension Mother   . Hypertension Father   . Stroke Sister   . Prostate cancer Neg Hx     Social History:  reports that he quit smoking about 20 years ago. He has never used smokeless tobacco. He reports that he drinks about 0.6 oz of alcohol per week. He reports that he does not use illicit drugs.  ROS: UROLOGY Frequent Urination?: No Hard to postpone urination?: No Burning/pain with urination?: No Get up at night to urinate?: No Leakage of urine?: Yes Urine stream starts and stops?: Yes Trouble starting stream?: No Do  you have to strain to urinate?: No Blood in urine?: No Urinary tract infection?: No Sexually transmitted disease?: No Injury to kidneys or bladder?: No Painful intercourse?: No Weak stream?: Yes Erection problems?: No Penile pain?: No  Gastrointestinal Nausea?: No Vomiting?: No Indigestion/heartburn?: No Diarrhea?: No Constipation?: No  Constitutional Fever: No Night sweats?: No Weight loss?: No Fatigue?: Yes  Skin Skin rash/lesions?: No Itching?: No  Eyes Blurred vision?: No Double vision?: No  Ears/Nose/Throat Sore throat?: No Sinus problems?: No  Hematologic/Lymphatic Swollen glands?: No Easy bruising?: No  Cardiovascular Leg swelling?: No Chest pain?: No  Respiratory Cough?: No Shortness of breath?: No  Endocrine Excessive thirst?: No  Musculoskeletal Back pain?: Yes Joint pain?: Yes  Neurological Headaches?: No Dizziness?: Yes  Psychologic Depression?: No Anxiety?: Yes  Physical Exam: BP 129/76 mmHg  Pulse 86  Ht 5\' 7"  (1.702 m)  Wt 178 lb 8 oz (80.967 kg)  BMI 27.95 kg/m2  Constitutional:  Alert and oriented, No acute distress. HEENT: Todd [name] AT, moist mucus membranes.  Trachea midline, no masses. Cardiovascular: No clubbing, cyanosis, or edema. Respiratory: Normal respiratory effort, no increased work of breathing. GI: Abdomen is soft, nontender, nondistended, no abdominal masses GU: No CVA tenderness.  Skin: No rashes, bruises or suspicious lesions. Lymph: No cervical or inguinal adenopathy. Neurologic: Grossly intact, no focal deficits, moving all 4 extremities. Psychiatric: Normal mood and affect.  Laboratory Data: Lab Results  Component Value Date   WBC 6.4 03/05/2015   HGB 11.1* 03/05/2015   HCT 33.3* 03/05/2015   MCV 94.4 03/05/2015   PLT 194 03/05/2015    Lab Results  Component Value Date   CREATININE 0.85 04/01/2015    Lab Results  Component Value Date   PSA 0.3 04/07/2015    No results found for:  TESTOSTERONE  Lab Results  Component Value Date   HGBA1C 6.2* 03/05/2015    Urinalysis    Component Value Date/Time   COLORURINE YELLOW* 03/04/2015 2134   APPEARANCEUR CLEAR* 03/04/2015 2134   LABSPEC 1.019 03/04/2015 2134   PHURINE 6.0 03/04/2015 2134   GLUCOSEU 50* 03/04/2015 2134   HGBUR NEGATIVE 03/04/2015 2134   BILIRUBINUR NEGATIVE 03/04/2015 2134   BILIRUBINUR Negative 03/01/2015 1537   Vazquez NEGATIVE 03/04/2015 2134   PROTEINUR NEGATIVE 03/04/2015 2134   NITRITE NEGATIVE 03/04/2015 2134   NITRITE Negative 03/01/2015 1537   LEUKOCYTESUR NEGATIVE 03/04/2015 2134  LEUKOCYTESUR Negative 03/01/2015 1537       Cystoscopy Procedure Note  Patient identification was confirmed, informed consent was obtained, and patient was prepped using Betadine solution.  Lidocaine jelly was administered per urethral meatus.    Preoperative abx where received prior to procedure.     Pre-Procedure: - Inspection reveals a normal caliber ureteral meatus.  Procedure: The flexible cystoscope was introduced without difficulty - No urethral strictures/lesions are present. - unremarkable fossa of prostate without striture / contracture. - Normal bladder neck - Bilateral ureteral orifices identified - Bladder mucosa  reveals no ulcers, tumors, or lesions - No bladder stones - No trabeculation  - No erytehma / overt radiation cystitis changes.   Retroflexion shows no additional findings.    Post-Procedure: - Patient tolerated the procedure well   Assessment & Plan:    1 - Prostate Cancer - excellent biochemical control on androgen deprivation.   2 - Lower Urinary Tract Symptoms / Urge Urinary Incontinence - No stricture / obstruction. He empties well. Failed anticholinergic x2. Discussed trial of Myrbetriq which would likely be safer at his age anywat and they want to try. Risks (HTN) discussed. 3 weeks samples given and daily RX to fill if positive response.   3 - RTC 6 mos  with PSA / T / Lupron 82.   No Follow-up on file.  Alexis Frock, [facility] [address], [place], [place] [address] [phone]

## 2015-05-03 ENCOUNTER — Telehealth: Payer: Self-pay | Admitting: Family Medicine

## 2015-05-03 NOTE — Telephone Encounter (Signed)
Pt is having injection in his back tomorrow and his daughter would like to know if there was something he could have to help him relax. Would like it to go to [employer].

## 2015-05-03 NOTE — Telephone Encounter (Signed)
Daughter Todd [name] called

## 2015-05-03 NOTE — Telephone Encounter (Signed)
Todd [name] may take an extra clonazepam

## 2015-05-04 DIAGNOSIS — M4806 Spinal stenosis, lumbar region: Secondary | ICD-10-CM | POA: Diagnosis not present

## 2015-05-04 DIAGNOSIS — M5136 Other intervertebral disc degeneration, lumbar region: Secondary | ICD-10-CM | POA: Diagnosis not present

## 2015-05-04 DIAGNOSIS — M5416 Radiculopathy, lumbar region: Secondary | ICD-10-CM | POA: Diagnosis not present

## 2015-05-09 ENCOUNTER — Other Ambulatory Visit: Payer: Self-pay | Admitting: Family Medicine

## 2015-05-10 DIAGNOSIS — H401131 Primary open-angle glaucoma, bilateral, mild stage: Secondary | ICD-10-CM | POA: Diagnosis not present

## 2015-05-13 ENCOUNTER — Other Ambulatory Visit: Payer: Self-pay | Admitting: Family Medicine

## 2015-05-13 NOTE — Telephone Encounter (Signed)
pts daughter called in and would like to get refill for klonopin. She states that the pt needs more than just 15 pills and would like a call back to further explain why.

## 2015-05-13 NOTE — Telephone Encounter (Signed)
This has not been refilled??

## 2015-05-17 MED ORDER — CLONAZEPAM 0.5 MG PO TABS: 0.5000 mg | ORAL_TABLET | Freq: Every day | ORAL | Status: DC

## 2015-05-26 ENCOUNTER — Encounter: Payer: Self-pay | Admitting: Family Medicine

## 2015-05-26 ENCOUNTER — Ambulatory Visit (INDEPENDENT_AMBULATORY_CARE_PROVIDER_SITE_OTHER): Payer: Medicare Other | Admitting: Family Medicine

## 2015-05-26 VITALS — BP 131/72 | HR 71 | Temp 97.8°F | Ht 66.6 in | Wt 175.6 lb

## 2015-05-26 DIAGNOSIS — G250 Essential tremor: Secondary | ICD-10-CM | POA: Diagnosis not present

## 2015-05-26 DIAGNOSIS — E119 Type 2 diabetes mellitus without complications: Secondary | ICD-10-CM

## 2015-05-26 DIAGNOSIS — I10 Essential (primary) hypertension: Secondary | ICD-10-CM | POA: Diagnosis not present

## 2015-05-26 LAB — BAYER DCA HB A1C WAIVED: HB A1C (BAYER DCA - WAIVED): 6.1 % (ref <7.0)

## 2015-05-26 MED ORDER — RAMIPRIL 10 MG PO CAPS
ORAL_CAPSULE | ORAL | Status: DC
Start: 2015-05-26 — End: 2015-08-12

## 2015-05-26 MED ORDER — METFORMIN HCL 500 MG PO TABS: 500.0000 mg | ORAL_TABLET | Freq: Two times a day (BID) | ORAL | Status: DC

## 2015-05-26 MED ORDER — OMEPRAZOLE 20 MG PO CPDR: 20.0000 mg | DELAYED_RELEASE_CAPSULE | Freq: Every day | ORAL | Status: DC

## 2015-05-26 MED ORDER — AMLODIPINE BESYLATE 5 MG PO TABS: 5.0000 mg | ORAL_TABLET | Freq: Every day | ORAL | Status: DC

## 2015-05-26 MED ORDER — PROPRANOLOL HCL 60 MG PO TABS: 60.0000 mg | ORAL_TABLET | Freq: Once | ORAL | Status: DC

## 2015-05-26 NOTE — Assessment & Plan Note (Signed)
The current medical regimen is effective;  continue present plan and medications.  

## 2015-05-26 NOTE — Progress Notes (Signed)
BP 131/72 mmHg  Pulse 71  Temp(Src) 97.8 F (36.6 C)  Ht 5' 6.6" (1.692 m)  Wt 175 lb 9.6 oz (79.652 kg)  BMI 27.82 kg/m2  SpO2 99%   Subjective:    Patient ID: Todd [name], male    DOB: 29-Aug-1930, 81 y.o.   MRN: [id number]  HPI: Todd [name] is a 81 y.o. male  Chief Complaint  Patient presents with  . Diabetes    Relevant past medical, surgical, family and social history reviewed and updated as indicated. Interim medical history since our last visit reviewed. Allergies and medications reviewed and updated.  Review of Systems  Constitutional: Negative.   Respiratory: Negative.   Cardiovascular: Negative.     Per HPI unless specifically indicated above     Objective:    BP 131/72 mmHg  Pulse 71  Temp(Src) 97.8 F (36.6 C)  Ht 5' 6.6" (1.692 m)  Wt 175 lb 9.6 oz (79.652 kg)  BMI 27.82 kg/m2  SpO2 99%  Wt Readings from Last 3 Encounters:  05/26/15 175 lb 9.6 oz (79.652 kg)  04/27/15 178 lb 8 oz (80.967 kg)  04/07/15 180 lb 9.6 oz (81.92 kg)    Physical Exam  Constitutional: He is oriented to person, place, and time. He appears well-developed and well-nourished. No distress.  HENT:  Head: Normocephalic and atraumatic.  Right Ear: Hearing normal.  Left Ear: Hearing normal.  Nose: Nose normal.  Eyes: Conjunctivae and lids are normal. Right eye exhibits no discharge. Left eye exhibits no discharge. No scleral icterus.  Cardiovascular: Normal rate, regular rhythm and normal heart sounds.   Pulmonary/Chest: Effort normal and breath sounds normal. No respiratory distress.  Musculoskeletal: Normal range of motion.  Neurological: He is alert and oriented to person, place, and time.  Skin: Skin is intact. No rash noted.  Psychiatric: He has a normal mood and affect. His speech is normal and behavior is normal. Judgment and thought content normal. Cognition and memory are normal.    Results for orders placed or performed in visit on 04/27/15  Microscopic  Examination  Result Value Ref Range   WBC, UA 0-5 0 -  5 /hpf   RBC, UA 0-2 0 -  2 /hpf   Epithelial Cells (non renal) 0-10 0 - 10 /hpf   Mucus, UA Present (A) Not Estab.   Bacteria, UA None seen None seen/Few  Urinalysis, Complete  Result Value Ref Range   Specific Gravity, UA 1.020 1.005 - 1.030   pH, UA 5.5 5.0 - 7.5   Color, UA Yellow Yellow   Appearance Ur Clear Clear   Leukocytes, UA Negative Negative   Protein, UA Negative Negative/Trace   Glucose, UA Negative Negative   Ketones, UA Trace (A) Negative   RBC, UA Negative Negative   Bilirubin, UA Negative Negative   Urobilinogen, Ur 1.0 0.2 - 1.0 mg/dL   Nitrite, UA Negative Negative   Microscopic Examination See below:   Bladder Scan (Post Void Residual) in office  Result Value Ref Range   Scan Result 35       Assessment & Plan:   Problem List Items Addressed This Visit      Cardiovascular and Mediastinum   Hypertension    The current medical regimen is effective;  continue present plan and medications.       Relevant Medications   propranolol (INDERAL) 60 MG tablet   ramipril (ALTACE) 10 MG capsule   amLODipine (NORVASC) 5 MG tablet     Endocrine  Diabetes mellitus without complication ([place]) - Primary    The current medical regimen is effective;  continue present plan and medications.       Relevant Medications   metFORMIN (GLUCOPHAGE) 500 MG tablet   ramipril (ALTACE) 10 MG capsule   Other Relevant Orders   Bayer DCA Hb A1c Waived     Nervous and Auditory   Benign essential tremor    The current medical regimen is effective;  continue present plan and medications.           Follow up plan: Return in about 3 months (around 08/24/2015) for a1c, Physical Exam.

## 2015-05-27 ENCOUNTER — Other Ambulatory Visit: Payer: Self-pay | Admitting: Family Medicine

## 2015-05-27 MED ORDER — PROPRANOLOL HCL ER 60 MG PO CP24: 60.0000 mg | ORAL_CAPSULE | Freq: Every day | ORAL | Status: DC

## 2015-06-21 DIAGNOSIS — G629 Polyneuropathy, unspecified: Secondary | ICD-10-CM | POA: Diagnosis not present

## 2015-06-22 DIAGNOSIS — M24662 Ankylosis, left knee: Secondary | ICD-10-CM | POA: Diagnosis not present

## 2015-06-22 DIAGNOSIS — Z96651 Presence of right artificial knee joint: Secondary | ICD-10-CM | POA: Diagnosis not present

## 2015-06-22 DIAGNOSIS — Z96652 Presence of left artificial knee joint: Secondary | ICD-10-CM | POA: Diagnosis not present

## 2015-06-24 ENCOUNTER — Telehealth: Payer: Self-pay | Admitting: Urology

## 2015-06-24 DIAGNOSIS — R202 Paresthesia of skin: Secondary | ICD-10-CM | POA: Diagnosis not present

## 2015-06-24 NOTE — Telephone Encounter (Signed)
Pt's daughter, [name], called stating that the patient was given samples of Myrbetriq and that the prescription is very expensive and insurance that he has does not pay much on that medication.  Is there another medication that the patient can take in it's place.  The patient told his daughter that he really could not tell a difference in his care on the medicine.  Please call patient and daughter to discuss their options.  The patient states that when he has to urinate he cannot hold it and that he has to go to the bathroom immediately or else he will have an accident.

## 2015-06-25 NOTE — Telephone Encounter (Signed)
Spoke with pt daughter in reference to Todd [name]. Daughter stated pt has been out of the medication for over a month and does not remember if medication was helping. Samples were left at the front for pt daughter to pick up.

## 2015-06-25 NOTE — Telephone Encounter (Signed)
LMOM

## 2015-06-29 ENCOUNTER — Telehealth: Payer: Self-pay | Admitting: Family Medicine

## 2015-06-29 NOTE — Telephone Encounter (Signed)
90 day Rx sent 05/26/15 with 2 refills

## 2015-06-29 NOTE — Telephone Encounter (Signed)
Pt's daughter [name] called stated pt needs a refill on Omeprazole. Pt's daughter stated pt needs 90 day supply due to insurance purposes. Pharm is [facility]. Thanks.

## 2015-07-04 DIAGNOSIS — Z96659 Presence of unspecified artificial knee joint: Secondary | ICD-10-CM | POA: Insufficient documentation

## 2015-07-06 DIAGNOSIS — M6281 Muscle weakness (generalized): Secondary | ICD-10-CM | POA: Diagnosis not present

## 2015-07-06 DIAGNOSIS — M25661 Stiffness of right knee, not elsewhere classified: Secondary | ICD-10-CM | POA: Diagnosis not present

## 2015-07-06 DIAGNOSIS — M25662 Stiffness of left knee, not elsewhere classified: Secondary | ICD-10-CM | POA: Diagnosis not present

## 2015-07-08 DIAGNOSIS — M24659 Ankylosis, unspecified hip: Secondary | ICD-10-CM | POA: Diagnosis not present

## 2015-07-09 DIAGNOSIS — H401131 Primary open-angle glaucoma, bilateral, mild stage: Secondary | ICD-10-CM | POA: Diagnosis not present

## 2015-07-13 DIAGNOSIS — M6281 Muscle weakness (generalized): Secondary | ICD-10-CM | POA: Diagnosis not present

## 2015-07-15 DIAGNOSIS — M6281 Muscle weakness (generalized): Secondary | ICD-10-CM | POA: Diagnosis not present

## 2015-07-23 ENCOUNTER — Telehealth: Payer: Self-pay | Admitting: Family Medicine

## 2015-07-23 NOTE — Telephone Encounter (Signed)
Pt's daughter came in stated they feels pt's RX for Cymbalta may be strong would like to know if this can be decreased to 20mg . Also, stated pt is having dental work done 08/03/15 and wants to know if an antibiotic can be sent to the pharmacy. Pharm is [facility]. Thanks.

## 2015-07-24 MED ORDER — AMOXICILLIN 875 MG PO TABS: 875.0000 mg | ORAL_TABLET | Freq: Two times a day (BID) | ORAL | Status: DC

## 2015-07-24 MED ORDER — DULOXETINE HCL 20 MG PO CPEP: 20.0000 mg | ORAL_CAPSULE | Freq: Every day | ORAL | Status: DC

## 2015-07-26 ENCOUNTER — Telehealth: Payer: Self-pay | Admitting: Family Medicine

## 2015-07-26 NOTE — Telephone Encounter (Signed)
pts daughter would like a call back to discuss side effects from some of the medications her dad is taking.

## 2015-07-26 NOTE — Telephone Encounter (Signed)
Daughter Todd [name] was not expecting a call, no need

## 2015-07-27 ENCOUNTER — Telehealth: Payer: Self-pay | Admitting: Family Medicine

## 2015-07-27 DIAGNOSIS — M6281 Muscle weakness (generalized): Secondary | ICD-10-CM | POA: Diagnosis not present

## 2015-07-27 NOTE — Telephone Encounter (Signed)
Pt's daughter called stated please disregard previous message as pt will be in office tomorrow for an appt. Thanks.

## 2015-07-28 ENCOUNTER — Ambulatory Visit (INDEPENDENT_AMBULATORY_CARE_PROVIDER_SITE_OTHER): Payer: PPO | Admitting: Family Medicine

## 2015-07-28 ENCOUNTER — Encounter: Payer: Self-pay | Admitting: Family Medicine

## 2015-07-28 VITALS — BP 152/86 | HR 82 | Temp 97.9°F | Ht 66.7 in | Wt 174.0 lb

## 2015-07-28 DIAGNOSIS — I10 Essential (primary) hypertension: Secondary | ICD-10-CM

## 2015-07-28 DIAGNOSIS — G250 Essential tremor: Secondary | ICD-10-CM

## 2015-07-28 NOTE — Progress Notes (Signed)
   BP 152/86 mmHg  Pulse 82  Temp(Src) 97.9 F (36.6 C)  Ht 5' 6.7" (1.694 m)  Wt 174 lb (78.926 kg)  BMI 27.50 kg/m2  SpO2 97%   Subjective:    Patient ID: Todd [name], male    DOB: 03-03-1931, 82 y.o.   MRN: [id number]  HPI: Todd [name] is a 82 y.o. male  Chief Complaint  Patient presents with  . Memory Loss    Patients son states that it has been going on for about a year. He did not bring his medication, so I was unable to reconcile them.  [name] Kitchen Speech Problem    Patient is having trouble finding his words   Patient here with his son Reviewed above problems and questions from notes. Patient's had workup with Dr. Brigitte Pulse at [facility] neurology Reviewed these issues with patient and son Patient otherwise doing well with no blood sugar problems Blood pressure is been elevated but all in all does well patient with no falling  Relevant past medical, surgical, family and social history reviewed and updated as indicated. Interim medical history since our last visit reviewed. Allergies and medications reviewed and updated.  Review of Systems  Constitutional: Negative.   Respiratory: Negative.   Cardiovascular: Negative.     Per HPI unless specifically indicated above     Objective:    BP 152/86 mmHg  Pulse 82  Temp(Src) 97.9 F (36.6 C)  Ht 5' 6.7" (1.694 m)  Wt 174 lb (78.926 kg)  BMI 27.50 kg/m2  SpO2 97%  Wt Readings from Last 3 Encounters:  07/28/15 174 lb (78.926 kg)  05/26/15 175 lb 9.6 oz (79.652 kg)  04/27/15 178 lb 8 oz (80.967 kg)    Physical Exam  Constitutional: He is oriented to person, place, and time. He appears well-developed and well-nourished. No distress.  HENT:  Head: Normocephalic and atraumatic.  Right Ear: Hearing normal.  Left Ear: Hearing normal.  Nose: Nose normal.  Eyes: Conjunctivae and lids are normal. Right eye exhibits no discharge. Left eye exhibits no discharge. No scleral icterus.  Pulmonary/Chest: Effort normal. No  respiratory distress.  Musculoskeletal: Normal range of motion.  Neurological: He is alert and oriented to person, place, and time.  Skin: Skin is intact. No rash noted.  Psychiatric: He has a normal mood and affect. His speech is normal and behavior is normal. Judgment and thought content normal. Cognition and memory are normal.  Decrease memory and difficulty with word finding    Results for orders placed or performed in visit on 05/26/15  Bayer DCA Hb A1c Waived  Result Value Ref Range   Bayer DCA Hb A1c Waived 6.1 <7.0 %      Assessment & Plan:   Problem List Items Addressed This Visit      Cardiovascular and Mediastinum   Hypertension - Primary    Stable for now will observe it stays high will consider further treatment        Nervous and Auditory   Benign essential tremor    Discussed tremor and anxiety word finding issues may be helped or made worse with clonazepam reviewed and gave written directions to hold clonazepam for a week or so and observe patient's symptoms          Follow up plan: Return in about 4 weeks (around 08/25/2015) for a1c, Physical Exam.

## 2015-07-28 NOTE — Assessment & Plan Note (Signed)
Stable for now will observe it stays high will consider further treatment

## 2015-07-28 NOTE — Assessment & Plan Note (Signed)
Discussed tremor and anxiety word finding issues may be helped or made worse with clonazepam reviewed and gave written directions to hold clonazepam for a week or so and observe patient's symptoms

## 2015-08-03 ENCOUNTER — Ambulatory Visit
Admission: EM | Admit: 2015-08-03 | Discharge: 2015-08-03 | Disposition: A | Discharge status: Home / Self Care | Payer: PPO | Attending: Family Medicine | Admitting: Family Medicine

## 2015-08-03 ENCOUNTER — Encounter: Payer: Self-pay | Admitting: Emergency Medicine

## 2015-08-03 DIAGNOSIS — D649 Anemia, unspecified: Secondary | ICD-10-CM | POA: Diagnosis not present

## 2015-08-03 DIAGNOSIS — R5383 Other fatigue: Secondary | ICD-10-CM

## 2015-08-03 LAB — COMPREHENSIVE METABOLIC PANEL
ALT: 10 U/L — ABNORMAL LOW (ref 17–63)
AST: 17 U/L (ref 15–41)
Albumin: 4.5 g/dL (ref 3.5–5.0)
Alkaline Phosphatase: 62 U/L (ref 38–126)
Anion gap: 6 (ref 5–15)
BUN: 21 mg/dL — ABNORMAL HIGH (ref 6–20)
CO2: 31 mmol/L (ref 22–32)
Calcium: 9.1 mg/dL (ref 8.9–10.3)
Chloride: 101 mmol/L (ref 101–111)
Creatinine, Ser: 0.91 mg/dL (ref 0.61–1.24)
GFR calc Af Amer: >60 mL/min (ref >60)
GFR calc non Af Amer: >60 mL/min (ref >60)
Glucose, Bld: 119 mg/dL — ABNORMAL HIGH (ref 65–99)
Potassium: 4.6 mmol/L (ref 3.5–5.1)
Sodium: 138 mmol/L (ref 135–145)
Total Bilirubin: 0.6 mg/dL (ref 0.3–1.2)
Total Protein: 7.6 g/dL (ref 6.5–8.1)

## 2015-08-03 LAB — URINALYSIS COMPLETE WITH MICROSCOPIC (ARMC ONLY)
Bacteria, UA: NONE SEEN
Glucose, UA: NEGATIVE mg/dL
Hgb urine dipstick: NEGATIVE
Leukocytes, UA: NEGATIVE
Nitrite: NEGATIVE
Protein, ur: NEGATIVE mg/dL
Specific Gravity, Urine: 1.02 (ref 1.005–1.030)
Squamous Epithelial / LPF: NONE SEEN
pH: 5.5 (ref 5.0–8.0)

## 2015-08-03 LAB — CBC WITH DIFFERENTIAL/PLATELET
Basophils Absolute: 0.1 10*3/uL (ref 0–0.1)
Basophils Relative: 1 %
Eosinophils Absolute: 0.1 10*3/uL (ref 0–0.7)
Eosinophils Relative: 2 %
HCT: 36.7 % — ABNORMAL LOW (ref 40.0–52.0)
Hemoglobin: 12.1 g/dL — ABNORMAL LOW (ref 13.0–18.0)
Lymphocytes Relative: 29 %
Lymphs Abs: 1.8 10*3/uL (ref 1.0–3.6)
MCH: 30.7 pg (ref 26.0–34.0)
MCHC: 33 g/dL (ref 32.0–36.0)
MCV: 93.1 fL (ref 80.0–100.0)
Monocytes Absolute: 0.7 10*3/uL (ref 0.2–1.0)
Monocytes Relative: 11 %
Neutro Abs: 3.6 10*3/uL (ref 1.4–6.5)
Neutrophils Relative %: 57 %
Platelets: 221 10*3/uL (ref 150–440)
RBC: 3.95 MIL/uL — ABNORMAL LOW (ref 4.40–5.90)
RDW: 13.1 % (ref 11.5–14.5)
WBC: 6.3 10*3/uL (ref 3.8–10.6)

## 2015-08-03 LAB — GLUCOSE, CAPILLARY: Glucose-Capillary: 91 mg/dL (ref 65–99)

## 2015-08-03 LAB — RAPID INFLUENZA A&B ANTIGENS
Influenza A (ARMC): NOT DETECTED — AB
Influenza B (ARMC): NOT DETECTED — AB

## 2015-08-03 NOTE — ED Notes (Signed)
Patient states that he has been feeling tired and weak since Saturday. Patient denies cold symptoms.  Patient denies fevers.  Patient denies fevers.

## 2015-08-03 NOTE — ED Provider Notes (Signed)
CSN: [id number]     Arrival date & time 08/03/15  1736 History   First MD Initiated Contact with Patient 08/03/15 2026     Chief Complaint  Patient presents with  . Fatigue   (Consider location/radiation/quality/duration/timing/severity/associated sxs/prior Treatment) HPI Comments: 82 yo male with a c/o fatigue for 4 days.Denies any fevers, chills, cough, congestion, melena, hematochezia, vomiting, diarrhea, numbness/tingling, edema,  one sided weakness, vision changes. Other that the fatigue feels okay.   The history is provided by the patient.    Past Medical History  Diagnosis Date  . Hypertension   . Diabetes mellitus without complication ([place])   . Prostate cancer ([place])   . Hyperlipidemia   . Lumbago     Lumbosacral Neuritis  . Depression   . Anxiety   . CAD (coronary artery disease)   . Iron deficiency anemia due to chronic blood loss   . GERD (gastroesophageal reflux disease)   . Glaucoma   . Arthritis     osteoarthritis-left knee  . Left heart failure ([place])   . Aortic regurgitation   . ED (erectile dysfunction)   . Accelerated hypertension 03/04/2015   Past Surgical History  Procedure Laterality Date  . Replacement total knee bilateral    . Carpal tunnel release     Family History  Problem Relation Age of Onset  . Stroke Mother   . Hypertension Mother   . Hypertension Father   . Stroke Sister   . Prostate cancer Neg Hx    Social History  Substance Use Topics  . Smoking status: Former Smoker    Quit date: 12/02/1994  . Smokeless tobacco: Never Used  . Alcohol Use: 0.6 oz/week    1 Glasses of wine per week     Comment: occasional    Review of Systems  Allergies  Fluoxetine and Meloxicam  Home Medications   Prior to Admission medications   Medication Sig Start Date End Date Taking? Authorizing Provider  aspirin EC 81 MG tablet Take 81 mg by mouth daily.    Historical Provider, MD  clonazePAM (KLONOPIN) 0.5 MG tablet Take 1 tablet (0.5 mg total) by  mouth daily. Patient not taking: Reported on 08/12/2015 05/17/15   Guadalupe Maple, MD  DULoxetine (CYMBALTA) 20 MG capsule Take 1 capsule (20 mg total) by mouth daily. Patient taking differently: Take 30 mg by mouth daily.  07/24/15   Guadalupe Maple, MD  gabapentin (NEURONTIN) 100 MG capsule Take 100 mg by mouth 3 (three) times daily.     Historical Provider, MD  HYDROcodone-acetaminophen (NORCO/VICODIN) 5-325 MG tablet Take by mouth. 07/22/15   Historical Provider, MD  metFORMIN (GLUCOPHAGE) 500 MG tablet Take 1 tablet (500 mg total) by mouth 2 (two) times daily with a meal. 05/26/15   Guadalupe Maple, MD  omeprazole (PRILOSEC) 20 MG capsule Take 1 capsule (20 mg total) by mouth daily. 05/26/15   Guadalupe Maple, MD  primidone (MYSOLINE) 50 MG tablet Take 25 mg by mouth 2 (two) times daily.     Historical Provider, MD  propranolol ER (INDERAL LA) 60 MG 24 hr capsule Take 1 capsule (60 mg total) by mouth daily. 05/27/15   Guadalupe Maple, MD  ramipril (ALTACE) 10 MG capsule Take 1 capsule (10 mg total) by mouth 2 (two) times daily. 08/12/15   Guadalupe Maple, MD  Travoprost, BAK Free, (TRAVATAN) 0.004 % SOLN ophthalmic solution Place 1 drop into both eyes at bedtime.    Historical Provider, MD  Meds Ordered and Administered this Visit  Medications - No data to display  BP 172/74 mmHg  Pulse 71  Temp(Src) 97.7 F (36.5 C) (Tympanic)  Resp 16  Ht 5\' 7"  (1.702 m)  Wt 174 lb (78.926 kg)  BMI 27.25 kg/m2  SpO2 99% No data found.   Physical Exam  Constitutional: He is oriented to person, place, and time. He appears well-developed and well-nourished. No distress.  HENT:  Head: Normocephalic and atraumatic.  Right Ear: Tympanic membrane, external ear and ear canal normal.  Left Ear: Tympanic membrane, external ear and ear canal normal.  Nose: Nose normal.  Mouth/Throat: Uvula is midline, oropharynx is clear and moist and mucous membranes are normal. No oropharyngeal exudate, posterior  oropharyngeal edema, posterior oropharyngeal erythema or tonsillar abscesses.  Eyes: Conjunctivae and EOM are normal. Pupils are equal, round, and reactive to light. Right eye exhibits no discharge. Left eye exhibits no discharge. No scleral icterus.  Neck: Normal range of motion. Neck supple. No tracheal deviation present. No thyromegaly present.  Cardiovascular: Normal rate, regular rhythm and normal heart sounds.   Pulmonary/Chest: Effort normal and breath sounds normal. No stridor. No respiratory distress. He has no wheezes. He has no rales. He exhibits no tenderness.  Abdominal: Soft. Bowel sounds are normal. He exhibits no distension. There is no tenderness.  Musculoskeletal: He exhibits no edema.  Lymphadenopathy:    He has no cervical adenopathy.  Neurological: He is alert and oriented to person, place, and time. No cranial nerve deficit.  Skin: Skin is warm and dry. No rash noted. He is not diaphoretic.  Nursing note and vitals reviewed.   ED Course  Procedures (including critical care time)  Labs Review Labs Reviewed  RAPID INFLUENZA A&B ANTIGENS ([facility] ONLY) - Abnormal; Notable for the following:    Influenza A [facility]) NOT DETECTED (*)    Influenza B ([facility]) NOT DETECTED (*)    All other components within normal limits  COMPREHENSIVE METABOLIC PANEL - Abnormal; Notable for the following:    Glucose, Bld 119 (*)    BUN 21 (*)    ALT 10 (*)    All other components within normal limits  CBC WITH DIFFERENTIAL/PLATELET - Abnormal; Notable for the following:    RBC 3.95 (*)    Hemoglobin 12.1 (*)    HCT 36.7 (*)    All other components within normal limits  URINALYSIS COMPLETEWITH MICROSCOPIC ([facility] ONLY) - Abnormal; Notable for the following:    Bilirubin Urine 1+ (*)    Ketones, ur TRACE (*)    All other components within normal limits  GLUCOSE, CAPILLARY  CBG MONITORING, ED    Imaging Review No results found.   Visual Acuity Review  Right Eye Distance:   Left Eye  Distance:   Bilateral Distance:    Right Eye Near:   Left Eye Near:    Bilateral Near:         MDM   1. Other fatigue   2. Anemia, mild     Discharge Medication List as of 08/03/2015  8:48 PM     1. diagnosis reviewed with patient 2. Recommend supportive treatment with fluids, rest 3. Follow-up with PCP or prn  Norval Gable, MD 08/18/15 [phone]

## 2015-08-05 DIAGNOSIS — F0390 Unspecified dementia without behavioral disturbance: Secondary | ICD-10-CM | POA: Diagnosis not present

## 2015-08-05 DIAGNOSIS — G629 Polyneuropathy, unspecified: Secondary | ICD-10-CM | POA: Diagnosis not present

## 2015-08-05 DIAGNOSIS — G250 Essential tremor: Secondary | ICD-10-CM | POA: Diagnosis not present

## 2015-08-10 DIAGNOSIS — M6281 Muscle weakness (generalized): Secondary | ICD-10-CM | POA: Diagnosis not present

## 2015-08-12 ENCOUNTER — Other Ambulatory Visit: Payer: Self-pay | Admitting: Family Medicine

## 2015-08-12 ENCOUNTER — Encounter: Payer: Self-pay | Admitting: Family Medicine

## 2015-08-12 ENCOUNTER — Ambulatory Visit (INDEPENDENT_AMBULATORY_CARE_PROVIDER_SITE_OTHER): Payer: PPO | Admitting: Family Medicine

## 2015-08-12 VITALS — BP 167/82 | HR 70 | Temp 97.4°F | Ht 67.0 in | Wt 175.0 lb

## 2015-08-12 DIAGNOSIS — G250 Essential tremor: Secondary | ICD-10-CM

## 2015-08-12 DIAGNOSIS — I10 Essential (primary) hypertension: Secondary | ICD-10-CM | POA: Diagnosis not present

## 2015-08-12 MED ORDER — RAMIPRIL 10 MG PO CAPS: 10.0000 mg | ORAL_CAPSULE | Freq: Two times a day (BID) | ORAL | Status: DC

## 2015-08-12 MED ORDER — RAMIPRIL 10 MG PO CAPS: 20.0000 mg | ORAL_CAPSULE | Freq: Two times a day (BID) | ORAL | Status: DC

## 2015-08-12 NOTE — Progress Notes (Signed)
BP 167/82 mmHg  Pulse 70  Temp(Src) 97.4 F (36.3 C)  Ht 5\' 7"  (1.702 m)  Wt 175 lb (79.379 kg)  BMI 27.40 kg/m2  SpO2 99%   Subjective:    Patient ID: Todd [name], male    DOB: April 05, 1931, 82 y.o.   MRN: [id number]  HPI: Todd [name] is a 82 y.o. male  Chief Complaint  Patient presents with  . No energy, feels weak all the time    Urgent Care last Wednesday   note delayed unknown computer issues Patient with continued cough cold feeling bad anxiety issues Still some tremor patient is accompanied by his daughter who helps with history Reviewed medications discussed blood pressure being elevated risk of lowering too much and risk of falls with patient being weak. Reviewed propanolol helping with tremor may be helping some also getting some blood pressure response Relevant past medical, surgical, family and social history reviewed and updated as indicated. Interim medical history since our last visit reviewed. Allergies and medications reviewed and updated.  Review of Systems  Constitutional: Negative.   Respiratory: Negative.   Cardiovascular: Negative.     Per HPI unless specifically indicated above     Objective:    BP 167/82 mmHg  Pulse 70  Temp(Src) 97.4 F (36.3 C)  Ht 5\' 7"  (1.702 m)  Wt 175 lb (79.379 kg)  BMI 27.40 kg/m2  SpO2 99%  Wt Readings from Last 3 Encounters:  08/12/15 175 lb (79.379 kg)  08/03/15 174 lb (78.926 kg)  07/28/15 174 lb (78.926 kg)    Physical Exam  Constitutional: He is oriented to person, place, and time. He appears well-developed and well-nourished. No distress.  HENT:  Head: Normocephalic and atraumatic.  Right Ear: Hearing normal.  Left Ear: Hearing normal.  Nose: Nose normal.  Eyes: Conjunctivae and lids are normal. Right eye exhibits no discharge. Left eye exhibits no discharge. No scleral icterus.  Cardiovascular: Normal rate, regular rhythm and normal heart sounds.   Pulmonary/Chest: Effort normal and breath sounds  normal. No respiratory distress.  Musculoskeletal: Normal range of motion.  Neurological: He is alert and oriented to person, place, and time.  Skin: Skin is intact. No rash noted.  Psychiatric: He has a normal mood and affect. His speech is normal and behavior is normal. Judgment and thought content normal. Cognition and memory are normal.    Results for orders placed or performed during the hospital encounter of 08/03/15  Rapid Influenza A&B Antigens ([facility] only)  Result Value Ref Range   Influenza A [facility]) NOT DETECTED (A) NEGATIVE   Influenza B ([facility]) NOT DETECTED (A) NEGATIVE  Glucose, capillary  Result Value Ref Range   Glucose-Capillary 91 65 - 99 mg/dL   Comment 1 Document in Chart   Comprehensive metabolic panel  Result Value Ref Range   Sodium 138 135 - 145 mmol/L   Potassium 4.6 3.5 - 5.1 mmol/L   Chloride 101 101 - 111 mmol/L   CO2 31 22 - 32 mmol/L   Glucose, Bld 119 (H) 65 - 99 mg/dL   BUN 21 (H) 6 - 20 mg/dL   Creatinine, Ser 0.91 0.61 - 1.24 mg/dL   Calcium 9.1 8.9 - 10.3 mg/dL   Total Protein 7.6 6.5 - 8.1 g/dL   Albumin 4.5 3.5 - 5.0 g/dL   AST 17 15 - 41 U/L   ALT 10 (L) 17 - 63 U/L   Alkaline Phosphatase 62 38 - 126 U/L   Total Bilirubin 0.6 0.3 -  1.2 mg/dL   GFR calc non Af Amer >60 >60 mL/min   GFR calc Af Amer >60 >60 mL/min   Anion gap 6 5 - 15  CBC with Differential  Result Value Ref Range   WBC 6.3 3.8 - 10.6 K/uL   RBC 3.95 (L) 4.40 - 5.90 MIL/uL   Hemoglobin 12.1 (L) 13.0 - 18.0 g/dL   HCT 36.7 (L) 40.0 - 52.0 %   MCV 93.1 80.0 - 100.0 fL   MCH 30.7 26.0 - 34.0 pg   MCHC 33.0 32.0 - 36.0 g/dL   RDW 13.1 11.5 - 14.5 %   Platelets 221 150 - 440 K/uL   Neutrophils Relative % 57 %   Neutro Abs 3.6 1.4 - 6.5 K/uL   Lymphocytes Relative 29 %   Lymphs Abs 1.8 1.0 - 3.6 K/uL   Monocytes Relative 11 %   Monocytes Absolute 0.7 0.2 - 1.0 K/uL   Eosinophils Relative 2 %   Eosinophils Absolute 0.1 0 - 0.7 K/uL   Basophils Relative 1 %   Basophils  Absolute 0.1 0 - 0.1 K/uL  Urinalysis complete, with microscopic  Result Value Ref Range   Color, Urine YELLOW YELLOW   APPearance CLEAR CLEAR   Glucose, UA NEGATIVE NEGATIVE mg/dL   Bilirubin Urine 1+ (A) NEGATIVE   Ketones, ur TRACE (A) NEGATIVE mg/dL   Specific Gravity, Urine 1.020 1.005 - 1.030   Hgb urine dipstick NEGATIVE NEGATIVE   pH 5.5 5.0 - 8.0   Protein, ur NEGATIVE NEGATIVE mg/dL   Nitrite NEGATIVE NEGATIVE   Leukocytes, UA NEGATIVE NEGATIVE   RBC / HPF 0-5 0 - 5 RBC/hpf   WBC, UA 0-5 0 - 5 WBC/hpf   Bacteria, UA NONE SEEN NONE SEEN   Squamous Epithelial / LPF NONE SEEN NONE SEEN      Assessment & Plan:   Problem List Items Addressed This Visit      Cardiovascular and Mediastinum   Hypertension    Discuss hypertension care and treatment with higher blood pressures because fall risk      Relevant Medications   aspirin EC 81 MG tablet     Nervous and Auditory   Benign essential tremor - Primary    Discussed tremor care and treatment along with blood pressure        discuss upper respiratory tract infection care and treatment  Follow up plan: Return for As scheduled.

## 2015-08-16 NOTE — Assessment & Plan Note (Signed)
Discussed tremor care and treatment along with blood pressure

## 2015-08-16 NOTE — Assessment & Plan Note (Signed)
Discuss hypertension care and treatment with higher blood pressures because fall risk

## 2015-08-20 ENCOUNTER — Other Ambulatory Visit: Payer: Self-pay | Admitting: Neurology

## 2015-08-20 DIAGNOSIS — F0390 Unspecified dementia without behavioral disturbance: Secondary | ICD-10-CM

## 2015-08-24 DIAGNOSIS — M25662 Stiffness of left knee, not elsewhere classified: Secondary | ICD-10-CM | POA: Diagnosis not present

## 2015-08-24 DIAGNOSIS — M25661 Stiffness of right knee, not elsewhere classified: Secondary | ICD-10-CM | POA: Diagnosis not present

## 2015-08-24 DIAGNOSIS — M6281 Muscle weakness (generalized): Secondary | ICD-10-CM | POA: Diagnosis not present

## 2015-08-25 ENCOUNTER — Encounter: Payer: Self-pay | Admitting: Family Medicine

## 2015-08-25 ENCOUNTER — Telehealth: Payer: Self-pay | Admitting: Family Medicine

## 2015-08-25 DIAGNOSIS — G5602 Carpal tunnel syndrome, left upper limb: Secondary | ICD-10-CM | POA: Diagnosis not present

## 2015-08-25 MED ORDER — DULOXETINE HCL 30 MG PO CPEP: 30.0000 mg | ORAL_CAPSULE | Freq: Every day | ORAL | Status: DC

## 2015-08-25 NOTE — Telephone Encounter (Signed)
Pt called stated [facility] needs new RX for Ramipril (90 day supply) to reflect the increase to 2 pills per day. Thanks.

## 2015-08-25 NOTE — Telephone Encounter (Signed)
Rx sent to his pharmacy. Was taking 30mg , so Rx changed.

## 2015-08-25 NOTE — Telephone Encounter (Signed)
Also wants to know if Duloxetine can be sent as a 90 day supply as well for insurance purposes. Thanks.

## 2015-08-26 DIAGNOSIS — M5136 Other intervertebral disc degeneration, lumbar region: Secondary | ICD-10-CM | POA: Diagnosis not present

## 2015-08-26 DIAGNOSIS — M5416 Radiculopathy, lumbar region: Secondary | ICD-10-CM | POA: Diagnosis not present

## 2015-08-31 ENCOUNTER — Ambulatory Visit (INDEPENDENT_AMBULATORY_CARE_PROVIDER_SITE_OTHER): Payer: PPO | Admitting: Family Medicine

## 2015-08-31 ENCOUNTER — Encounter: Payer: Self-pay | Admitting: Family Medicine

## 2015-08-31 VITALS — BP 160/80 | HR 69 | Temp 97.4°F | Ht 66.3 in | Wt 173.0 lb

## 2015-08-31 DIAGNOSIS — F419 Anxiety disorder, unspecified: Secondary | ICD-10-CM

## 2015-08-31 DIAGNOSIS — I10 Essential (primary) hypertension: Secondary | ICD-10-CM | POA: Diagnosis not present

## 2015-08-31 DIAGNOSIS — E119 Type 2 diabetes mellitus without complications: Secondary | ICD-10-CM | POA: Diagnosis not present

## 2015-08-31 LAB — BAYER DCA HB A1C WAIVED: HB A1C (BAYER DCA - WAIVED): 5.8 % (ref <7.0)

## 2015-08-31 MED ORDER — METOPROLOL SUCCINATE ER 25 MG PO TB24: 25.0000 mg | ORAL_TABLET | Freq: Every day | ORAL | Status: DC

## 2015-08-31 MED ORDER — DIAZEPAM 2 MG PO TABS: 2.0000 mg | ORAL_TABLET | Freq: Every day | ORAL | Status: DC | PRN

## 2015-08-31 NOTE — Progress Notes (Signed)
   BP 160/80 mmHg  Pulse 69  Temp(Src) 97.4 F (36.3 C)  Ht 5' 6.3" (1.684 m)  Wt 173 lb (78.472 kg)  BMI 27.67 kg/m2  SpO2 99%   Subjective:    Patient ID: Todd [name], male    DOB: [birth year], 82 y.o.   MRN: [id number]  HPI: Todd [name] is a 82 y.o. male  Chief Complaint  Patient presents with  . Hypertension  follow-up hypertension here with daughter also who assists with history patient concerned propanolol is not doing much for tremor may be something for blood pressure but blood pressures elevated today. Patient's nerves at times are okay but a lot of times having rough conditions Patient taking clonazepam low-dose still having a great deal of anxiety and nerves Looking to try something else will try low-dose Valium Continue the Cymbalta Patient no issues with diabetes hasn't had follow-up is due for A1c  Relevant past medical, surgical, family and social history reviewed and updated as indicated. Interim medical history since our last visit reviewed. Allergies and medications reviewed and updated.  Review of Systems  Constitutional: Negative.   Respiratory: Negative.   Cardiovascular: Negative.     Per HPI unless specifically indicated above     Objective:    BP 160/80 mmHg  Pulse 69  Temp(Src) 97.4 F (36.3 C)  Ht 5' 6.3" (1.684 m)  Wt 173 lb (78.472 kg)  BMI 27.67 kg/m2  SpO2 99%  Wt Readings from Last 3 Encounters:  08/31/15 173 lb (78.472 kg)  08/12/15 175 lb (79.379 kg)  08/03/15 174 lb (78.926 kg)    Physical Exam  Constitutional: He is oriented to person, place, and time. He appears well-developed and well-nourished. No distress.  HENT:  Head: Normocephalic and atraumatic.  Right Ear: Hearing normal.  Left Ear: Hearing normal.  Nose: Nose normal.  Eyes: Conjunctivae and lids are normal. Right eye exhibits no discharge. Left eye exhibits no discharge. No scleral icterus.  Cardiovascular: Normal rate, regular rhythm and normal heart sounds.    Pulmonary/Chest: Effort normal and breath sounds normal. No respiratory distress.  Musculoskeletal: Normal range of motion.  Neurological: He is alert and oriented to person, place, and time.  Skin: Skin is intact. No rash noted.  Psychiatric: He has a normal mood and affect. His speech is normal and behavior is normal. Judgment and thought content normal. Cognition and memory are normal.        Assessment & Plan:   Problem List Items Addressed This Visit      Cardiovascular and Mediastinum   Hypertension - Primary    Continued poor control will stop propanolol begin  Metoprolol 25 mg once a day      Relevant Medications   metoprolol succinate (TOPROL-XL) 25 MG 24 hr tablet     Endocrine   Diabetes mellitus without complication (HCC)   Relevant Orders   Bayer DCA Hb A1c Waived     Other   Anxiety    Discuss anxiety will stop clonazepam trial of Valium 2 mg      Relevant Medications   diazepam (VALIUM) 2 MG tablet       Follow up plan: Return in about 3 months (around 12/01/2015) for a1c bmp.  Has PE in May

## 2015-08-31 NOTE — Assessment & Plan Note (Signed)
Continued poor control will stop propanolol begin  Metoprolol 25 mg once a day

## 2015-08-31 NOTE — Assessment & Plan Note (Signed)
Discuss anxiety will stop clonazepam trial of Valium 2 mg

## 2015-09-07 DIAGNOSIS — M24659 Ankylosis, unspecified hip: Secondary | ICD-10-CM | POA: Diagnosis not present

## 2015-09-07 DIAGNOSIS — M25662 Stiffness of left knee, not elsewhere classified: Secondary | ICD-10-CM | POA: Diagnosis not present

## 2015-09-07 DIAGNOSIS — M6281 Muscle weakness (generalized): Secondary | ICD-10-CM | POA: Diagnosis not present

## 2015-09-07 DIAGNOSIS — M25661 Stiffness of right knee, not elsewhere classified: Secondary | ICD-10-CM | POA: Diagnosis not present

## 2015-09-07 DIAGNOSIS — M5136 Other intervertebral disc degeneration, lumbar region: Secondary | ICD-10-CM | POA: Diagnosis not present

## 2015-09-08 ENCOUNTER — Ambulatory Visit: Payer: PPO

## 2015-09-16 ENCOUNTER — Ambulatory Visit: Payer: PPO

## 2015-09-20 ENCOUNTER — Telehealth: Payer: Self-pay | Admitting: Family Medicine

## 2015-09-20 NOTE — Telephone Encounter (Signed)
pts daughter called and stated that the pt was shaking really bad so on Sunday she advised the pt to take 2 metoprolol and his hands aren't shaking as bad so he took 2 today as well.

## 2015-10-11 ENCOUNTER — Ambulatory Visit (INDEPENDENT_AMBULATORY_CARE_PROVIDER_SITE_OTHER): Payer: PPO | Admitting: Family Medicine

## 2015-10-11 ENCOUNTER — Encounter: Payer: Self-pay | Admitting: Family Medicine

## 2015-10-11 VITALS — BP 164/65 | HR 71 | Temp 97.4°F | Ht 66.3 in | Wt 175.0 lb

## 2015-10-11 DIAGNOSIS — G250 Essential tremor: Secondary | ICD-10-CM

## 2015-10-11 DIAGNOSIS — Z0000 Encounter for general adult medical examination without abnormal findings: Secondary | ICD-10-CM | POA: Diagnosis not present

## 2015-10-11 DIAGNOSIS — E119 Type 2 diabetes mellitus without complications: Secondary | ICD-10-CM | POA: Diagnosis not present

## 2015-10-11 DIAGNOSIS — I10 Essential (primary) hypertension: Secondary | ICD-10-CM | POA: Diagnosis not present

## 2015-10-11 DIAGNOSIS — K219 Gastro-esophageal reflux disease without esophagitis: Secondary | ICD-10-CM

## 2015-10-11 LAB — URINALYSIS, ROUTINE W REFLEX MICROSCOPIC
Bilirubin, UA: NEGATIVE
Glucose, UA: NEGATIVE
Ketones, UA: NEGATIVE
Leukocytes, UA: NEGATIVE
Nitrite, UA: NEGATIVE
Protein, UA: NEGATIVE
RBC, UA: NEGATIVE
Specific Gravity, UA: 1.015 (ref 1.005–1.030)
Urobilinogen, Ur: 0.2 mg/dL (ref 0.2–1.0)
pH, UA: 5 (ref 5.0–7.5)

## 2015-10-11 MED ORDER — DULOXETINE HCL 30 MG PO CPEP: 30.0000 mg | ORAL_CAPSULE | Freq: Every day | ORAL | Status: DC

## 2015-10-11 MED ORDER — METFORMIN HCL 500 MG PO TABS: 500.0000 mg | ORAL_TABLET | Freq: Two times a day (BID) | ORAL | Status: DC

## 2015-10-11 MED ORDER — METOPROLOL SUCCINATE ER 50 MG PO TB24: 50.0000 mg | ORAL_TABLET | Freq: Every day | ORAL | Status: DC

## 2015-10-11 MED ORDER — DULOXETINE HCL 20 MG PO CPEP: 20.0000 mg | ORAL_CAPSULE | Freq: Every day | ORAL | Status: DC

## 2015-10-11 MED ORDER — RAMIPRIL 10 MG PO CAPS: 10.0000 mg | ORAL_CAPSULE | Freq: Two times a day (BID) | ORAL | Status: DC

## 2015-10-11 NOTE — Assessment & Plan Note (Signed)
Elevated today but apparent good control on 50 mg were given a prescription which can be increased to 2 a day discuss monitoring pulse and blood pressure

## 2015-10-11 NOTE — Assessment & Plan Note (Signed)
Tremor improved with metoprolol

## 2015-10-11 NOTE — Assessment & Plan Note (Signed)
Discuss no further reflux symptoms will hold Prilosec and observe

## 2015-10-11 NOTE — Assessment & Plan Note (Signed)
The current medical regimen is effective;  continue present plan and medications.  

## 2015-10-11 NOTE — Progress Notes (Signed)
BP 164/65 mmHg  Pulse 71  Temp(Src) 97.4 F (36.3 C)  Ht 5' 6.3" (1.684 m)  Wt 175 lb (79.379 kg)  BMI 27.99 kg/m2  SpO2 95%   Subjective:    Patient ID: Todd [name], male    DOB: 06/03/1931, 82 y.o.   MRN: [id number]  HPI: Todd [name] is a 83 y.o. male  Chief Complaint  Patient presents with  . Annual Exam  medicare metrics met Patient all in all doing well accompanied by his daughter who assists with history. His been taking metoprolol 25 mg which didn't do anything especially for tremor increased to 50 mg which had significant improvement but not 100% improvement blood pressure was slightly elevated taking this As a consequence of taking 2 a day ran out quicker and has not taken anything today and only 25 mg yesterday. All in all doing well on metoprolol 50 mg with helping of his tremor also. No blood sugar issues noted low blood sugar spells Center interested in stopping omeprazole was started for some left chest pain by cardiologists patient hadn't had any further symptoms so K to go ahead and stop symptoms may take when necessary  Discussed pain medicine which is been written by Dr. Prescott Parma taking 1 twice a day patient will contact this doctor to make sure he'll be able to taper medications so can minimize withdrawal effects as reported it's Dr. for pain will discontinue this medication discussed alternatives Advil and Aleve Tylenol    Relevant past medical, surgical, family and social history reviewed and updated as indicated. Interim medical history since our last visit reviewed. Allergies and medications reviewed and updated.  Review of Systems  Constitutional: Negative.   HENT: Negative.   Eyes: Negative.   Respiratory: Negative.   Cardiovascular: Negative.   Gastrointestinal: Negative.   Endocrine: Negative.   Genitourinary: Negative.   Musculoskeletal: Negative.   Skin: Negative.   Allergic/Immunologic: Negative.   Neurological: Negative.   Hematological:  Negative.   Psychiatric/Behavioral: Negative.     Per HPI unless specifically indicated above     Objective:    BP 164/65 mmHg  Pulse 71  Temp(Src) 97.4 F (36.3 C)  Ht 5' 6.3" (1.684 m)  Wt 175 lb (79.379 kg)  BMI 27.99 kg/m2  SpO2 95%  Wt Readings from Last 3 Encounters:  10/11/15 175 lb (79.379 kg)  08/31/15 173 lb (78.472 kg)  08/12/15 175 lb (79.379 kg)    Physical Exam  Constitutional: He is oriented to person, place, and time. He appears well-developed and well-nourished.  HENT:  Head: Normocephalic and atraumatic.  Right Ear: External ear normal.  Left Ear: External ear normal.  Eyes: Conjunctivae and EOM are normal. Pupils are equal, round, and reactive to light.  Neck: Normal range of motion. Neck supple.  Cardiovascular: Normal rate, regular rhythm, normal heart sounds and intact distal pulses.   Pulmonary/Chest: Effort normal and breath sounds normal.  Abdominal: Soft. Bowel sounds are normal. There is no splenomegaly or hepatomegaly.  Genitourinary: Rectum normal and penis normal.  Prostate removed  Musculoskeletal: Normal range of motion.  Neurological: He is alert and oriented to person, place, and time. He has normal reflexes.  Skin: No rash noted. No erythema.  Psychiatric: He has a normal mood and affect. His behavior is normal. Judgment and thought content normal.    Results for orders placed or performed in visit on 08/31/15  Bayer DCA Hb A1c Waived  Result Value Ref Range   Bayer DCA Hb  A1c Waived 5.8 <7.0 %      Assessment & Plan:   Problem List Items Addressed This Visit      Cardiovascular and Mediastinum   Hypertension    Elevated today but apparent good control on 50 mg were given a prescription which can be increased to 2 a day discuss monitoring pulse and blood pressure      Relevant Medications   metoprolol succinate (TOPROL-XL) 50 MG 24 hr tablet   ramipril (ALTACE) 10 MG capsule     Digestive   GERD (gastroesophageal reflux  disease)    Discuss no further reflux symptoms will hold Prilosec and observe        Endocrine   Diabetes mellitus without complication ([place])    .The current medical regimen is effective;  continue present plan and medications.       Relevant Medications   metFORMIN (GLUCOPHAGE) 500 MG tablet   ramipril (ALTACE) 10 MG capsule     Nervous and Auditory   Benign essential tremor    Tremor improved with metoprolol       Other Visit Diagnoses    Routine general medical examination at a health care facility    -  Primary    Relevant Orders    CBC with Differential/Platelet    Comprehensive metabolic panel    Lipid Panel w/o Chol/HDL Ratio    TSH    Urinalysis, Routine w reflex microscopic (not at [facility])        Follow up plan: Return in about 2 months (around 12/11/2015) for a1c.

## 2015-10-12 ENCOUNTER — Encounter: Payer: Self-pay | Admitting: Family Medicine

## 2015-10-12 LAB — COMPREHENSIVE METABOLIC PANEL
ALT: 8 IU/L (ref 0–44)
AST: 12 IU/L (ref 0–40)
Albumin/Globulin Ratio: 1.8 (ref 1.2–2.2)
Albumin: 4.3 g/dL (ref 3.5–4.7)
Alkaline Phosphatase: 72 IU/L (ref 39–117)
BUN/Creatinine Ratio: 20 (ref 10–24)
BUN: 18 mg/dL (ref 8–27)
Bilirubin Total: 0.5 mg/dL (ref 0.0–1.2)
CO2: 28 mmol/L (ref 18–29)
Calcium: 9.3 mg/dL (ref 8.6–10.2)
Chloride: 104 mmol/L (ref 96–106)
Creatinine, Ser: 0.89 mg/dL (ref 0.76–1.27)
GFR calc Af Amer: 91 mL/min/{1.73_m2} (ref >59)
GFR calc non Af Amer: 79 mL/min/{1.73_m2} (ref >59)
Globulin, Total: 2.4 g/dL (ref 1.5–4.5)
Glucose: 145 mg/dL — ABNORMAL HIGH (ref 65–99)
Potassium: 4.5 mmol/L (ref 3.5–5.2)
Sodium: 145 mmol/L — ABNORMAL HIGH (ref 134–144)
Total Protein: 6.7 g/dL (ref 6.0–8.5)

## 2015-10-12 LAB — CBC WITH DIFFERENTIAL/PLATELET
Basophils Absolute: 0 10*3/uL (ref 0.0–0.2)
Basos: 1 %
EOS (ABSOLUTE): 0.1 10*3/uL (ref 0.0–0.4)
Eos: 1 %
Hematocrit: 37 % — ABNORMAL LOW (ref 37.5–51.0)
Hemoglobin: 11.8 g/dL — ABNORMAL LOW (ref 12.6–17.7)
Immature Grans (Abs): 0.1 10*3/uL (ref 0.0–0.1)
Immature Granulocytes: 1 %
Lymphocytes Absolute: 1.5 10*3/uL (ref 0.7–3.1)
Lymphs: 25 %
MCH: 30.6 pg (ref 26.6–33.0)
MCHC: 31.9 g/dL (ref 31.5–35.7)
MCV: 96 fL (ref 79–97)
Monocytes Absolute: 0.6 10*3/uL (ref 0.1–0.9)
Monocytes: 9 %
Neutrophils Absolute: 3.9 10*3/uL (ref 1.4–7.0)
Neutrophils: 63 %
Platelets: 205 10*3/uL (ref 150–379)
RBC: 3.85 x10E6/uL — ABNORMAL LOW (ref 4.14–5.80)
RDW: 12.9 % (ref 12.3–15.4)
WBC: 6.2 10*3/uL (ref 3.4–10.8)

## 2015-10-12 LAB — LIPID PANEL W/O CHOL/HDL RATIO
Cholesterol, Total: 193 mg/dL (ref 100–199)
HDL: 51 mg/dL (ref >39)
LDL Calculated: 126 mg/dL — ABNORMAL HIGH (ref 0–99)
Triglycerides: 80 mg/dL (ref 0–149)
VLDL Cholesterol Cal: 16 mg/dL (ref 5–40)

## 2015-10-12 LAB — TSH: TSH: 1.06 u[IU]/mL (ref 0.450–4.500)

## 2015-10-20 ENCOUNTER — Other Ambulatory Visit: Payer: Self-pay | Admitting: Family Medicine

## 2015-10-20 ENCOUNTER — Telehealth: Payer: Self-pay | Admitting: Family Medicine

## 2015-10-20 NOTE — Telephone Encounter (Signed)
pts daughter would like to speak to [facility] about the pts lab results.

## 2015-10-28 DIAGNOSIS — M4807 Spinal stenosis, lumbosacral region: Secondary | ICD-10-CM | POA: Diagnosis not present

## 2015-10-28 DIAGNOSIS — M6281 Muscle weakness (generalized): Secondary | ICD-10-CM | POA: Diagnosis not present

## 2015-11-02 DIAGNOSIS — M6281 Muscle weakness (generalized): Secondary | ICD-10-CM | POA: Diagnosis not present

## 2015-11-02 DIAGNOSIS — M4807 Spinal stenosis, lumbosacral region: Secondary | ICD-10-CM | POA: Diagnosis not present

## 2015-11-03 ENCOUNTER — Other Ambulatory Visit: Payer: Self-pay

## 2015-11-05 ENCOUNTER — Other Ambulatory Visit: Payer: PPO

## 2015-11-05 DIAGNOSIS — C61 Malignant neoplasm of prostate: Secondary | ICD-10-CM | POA: Diagnosis not present

## 2015-11-06 LAB — TESTOSTERONE: Testosterone: 11 ng/dL — ABNORMAL LOW (ref 348–1197)

## 2015-11-06 LAB — PSA: Prostate Specific Ag, Serum: 0.3 ng/mL (ref 0.0–4.0)

## 2015-11-09 ENCOUNTER — Encounter: Payer: Self-pay | Admitting: Urology

## 2015-11-09 ENCOUNTER — Ambulatory Visit (INDEPENDENT_AMBULATORY_CARE_PROVIDER_SITE_OTHER): Payer: PPO | Admitting: Urology

## 2015-11-09 VITALS — BP 142/78 | HR 61 | Ht 68.0 in | Wt 172.1 lb

## 2015-11-09 DIAGNOSIS — C61 Malignant neoplasm of prostate: Secondary | ICD-10-CM

## 2015-11-09 DIAGNOSIS — D239 Other benign neoplasm of skin, unspecified: Secondary | ICD-10-CM

## 2015-11-09 DIAGNOSIS — D294 Benign neoplasm of scrotum: Secondary | ICD-10-CM | POA: Diagnosis not present

## 2015-11-09 DIAGNOSIS — N3943 Post-void dribbling: Secondary | ICD-10-CM | POA: Diagnosis not present

## 2015-11-09 MED ORDER — LEUPROLIDE ACETATE (6 MONTH) 45 MG IM KIT
45.0000 mg | PACK | Freq: Once | INTRAMUSCULAR | Status: AC
  Administered 2015-11-09: 45 mg via INTRAMUSCULAR

## 2015-11-09 NOTE — Progress Notes (Signed)
04/27/2015 3:44 PM   Todd [name] [birth year] [id number]  Referring provider: Guadalupe Maple, MD [address] [place], [place] [address]  CC: Follow up prostate cancer with labs.   HPI:  1 - Prostate Cancer - treated with primary radiation for unknown grade / stage disease years agoe, then biochemical recurrence.  Now on Lupron Q67monthly 04/2015 PSA 0.3 --> Lupron 45mg  11/2015 PSA 0.03 / T 11 --> Lupron 45mg   2 - Lower Urinary Tract Symptoms / Urge Urinary Incontinence - Slowly progressive bother for urienary urgency with inctontinence. PVR 04/2015 "22mL". Tried Vesicare 5mg  daily but no significant improvement. Cysto 04/2015 without stricture / mass / radiation cystitis. Giving trial Myrbetriq and reports modest improvement.  3 - Angiokeratoma of [place] - pt with minor bother from "easy to bleed" varicosities on scrotum c/w angiokeratoma. Not-severe. Only very occasional minor bleed with local traunam (catching on watch or zipper). We have agreed on surveillance.   PMH sig for CAD/DOE, DM2 with neuropathy, L-spine stenosis.   Today "Todd [name]" is seen for PSA, Lupron, and f/u above. No interval fractures, hematuria.    PMH: Past Medical History  Diagnosis Date  . Hypertension   . Diabetes mellitus without complication ([place])   . Prostate cancer ([place])   . Hyperlipidemia   . Lumbago     Lumbosacral Neuritis  . Depression   . Anxiety   . CAD (coronary artery disease)   . Iron deficiency anemia due to chronic blood loss   . GERD (gastroesophageal reflux disease)   . Glaucoma   . Arthritis     osteoarthritis-left knee  . Left heart failure ([place])   . Aortic regurgitation   . ED (erectile dysfunction)   . Accelerated hypertension 03/04/2015    Surgical History: Past Surgical History  Procedure Laterality Date  . Replacement total knee bilateral    . Carpal tunnel release      Home Medications:    Medication List       This list is accurate as of: 04/27/15  3:44 PM.   Always use your most recent med list.               Alpha-Lipoic Acid 200 MG Caps  Take 200 mg by mouth daily.     amLODipine 5 MG tablet  Commonly known as:  NORVASC  Take 1 tablet (5 mg total) by mouth daily.     aspirin 81 MG tablet  Take 81 mg by mouth daily.     clonazePAM 0.5 MG tablet  Commonly known as:  KLONOPIN  TAKE 1 TABLET DAILY.     DULoxetine 30 MG capsule  Commonly known as:  CYMBALTA  Take 30 mg by mouth daily.     gabapentin 100 MG capsule  Commonly known as:  NEURONTIN  Take 100 mg by mouth 2 (two) times daily.     gabapentin 600 MG tablet  Commonly known as:  NEURONTIN     HYDROcodone-acetaminophen 5-325 MG tablet  Commonly known as:  NORCO/VICODIN  1 po bid prn     metFORMIN 500 MG tablet  Commonly known as:  GLUCOPHAGE  Take by mouth.     omeprazole 20 MG capsule  Commonly known as:  PRILOSEC  Take 20 mg by mouth daily.     primidone 50 MG tablet  Commonly known as:  MYSOLINE  Take by mouth 2 (two) times daily.     propranolol 60 MG tablet  Commonly known as:  INDERAL  Take  1 tablet (60 mg total) by mouth once.     ramipril 10 MG capsule  Commonly known as:  ALTACE  Take 1 capsule (10 mg total) by mouth daily.     solifenacin 5 MG tablet  Commonly known as:  VESICARE  Take 5 mg by mouth daily.     Travoprost (BAK Free) 0.004 % Soln ophthalmic solution  Commonly known as:  TRAVATAN  Place 1 drop into both eyes at bedtime.        Allergies:  Allergies  Allergen Reactions  . Fluoxetine     headache  . Meloxicam Other (See Comments)    Other reaction(s): Dizziness Nervousness.     Family History: Family History  Problem Relation Age of Onset  . Stroke Mother   . Hypertension Mother   . Hypertension Father   . Stroke Sister   . Prostate cancer Neg Hx     Social History:  reports that he quit smoking about 20 years ago. He has never used smokeless tobacco. He reports that he drinks about 0.6 oz of alcohol per week.  He reports that he does not use illicit drugs.  ROS: UROLOGY Frequent Urination?: No Hard to postpone urination?: No Burning/pain with urination?: No Get up at night to urinate?: No Leakage of urine?: Yes Urine stream starts and stops?: Yes Trouble starting stream?: No Do you have to strain to urinate?: No Blood in urine?: No Urinary tract infection?: No Sexually transmitted disease?: No Injury to kidneys or bladder?: No Painful intercourse?: No Weak stream?: Yes Erection problems?: No Penile pain?: No  Gastrointestinal Nausea?: No Vomiting?: No Indigestion/heartburn?: No Diarrhea?: No Constipation?: No  Constitutional Fever: No Night sweats?: No Weight loss?: No Fatigue?: Yes  Skin Skin rash/lesions?: No Itching?: No  Eyes Blurred vision?: No Double vision?: No  Ears/Nose/Throat Sore throat?: No Sinus problems?: No  Hematologic/Lymphatic Swollen glands?: No Easy bruising?: No  Cardiovascular Leg swelling?: No Chest pain?: No  Respiratory Cough?: No Shortness of breath?: No  Endocrine Excessive thirst?: No  Musculoskeletal Back pain?: Yes Joint pain?: Yes  Neurological Headaches?: No Dizziness?: Yes  Psychologic Depression?: No Anxiety?: Yes  Physical Exam: BP 129/76 mmHg  Pulse 86  Ht 5\' 7"  (1.702 m)  Wt 178 lb 8 oz (80.967 kg)  BMI 27.95 kg/m2  Constitutional:  Alert and oriented, No acute distress. HEENT: [place] AT, moist mucus membranes.  Trachea midline, no masses. Cardiovascular: No clubbing, cyanosis, or edema. Respiratory: Normal respiratory effort, no increased work of breathing. GI: Abdomen is soft, nontender, nondistended, no abdominal masses GU: No CVA tenderness.  Skin: No rashes, bruises or suspicious lesions. Lymph: No cervical or inguinal adenopathy. Neurologic: Grossly intact, no focal deficits, moving all 4 extremities. Psychiatric: Normal mood and affect.  Laboratory Data: Lab Results  Component Value Date    WBC 6.4 03/05/2015   HGB 11.1* 03/05/2015   HCT 33.3* 03/05/2015   MCV 94.4 03/05/2015   PLT 194 03/05/2015    Lab Results  Component Value Date   CREATININE 0.85 04/01/2015    Lab Results  Component Value Date   PSA 0.3 04/07/2015    No results found for: TESTOSTERONE  Lab Results  Component Value Date   HGBA1C 6.2* 03/05/2015    Urinalysis    Component Value Date/Time   COLORURINE YELLOW* 03/04/2015 2134   APPEARANCEUR CLEAR* 03/04/2015 2134   LABSPEC 1.019 03/04/2015 2134   PHURINE 6.0 03/04/2015 2134   GLUCOSEU 50* 03/04/2015 2134   HGBUR NEGATIVE 03/04/2015  2134   BILIRUBINUR NEGATIVE 03/04/2015 2134   BILIRUBINUR Negative 03/01/2015 [place] 03/04/2015 2134   PROTEINUR NEGATIVE 03/04/2015 2134   NITRITE NEGATIVE 03/04/2015 2134   NITRITE Negative 03/01/2015 1537   LEUKOCYTESUR NEGATIVE 03/04/2015 2134   LEUKOCYTESUR Negative 03/01/2015 1537      Assessment & Plan:    1 - Prostate Cancer - excellent biochemical control on continuous androgen deprivation. He tollerates with minimal side effects. Continue.   2 - Lower Urinary Tract Symptoms / Urge Urinary Incontinence - No stricture / obstruction or severe radiation cystitis by recent cysto. He empties well. Continue Myrbetriq 50 QD.   3 - Angiokeratoma of [place] - discussed natural history (tend to persist, some very slowly progressive) and management options including observation v. Laser ablation (would require anesthetic). We both agree on observation. Again, these are non-trasmissable.   4 - RTC 6 mos with PSA / T / Lupron 45.     Alexis Frock, [facility] [address] [place], [place] [address] [phone]

## 2015-11-10 ENCOUNTER — Ambulatory Visit: Payer: Self-pay

## 2015-11-10 DIAGNOSIS — M6281 Muscle weakness (generalized): Secondary | ICD-10-CM | POA: Diagnosis not present

## 2015-11-10 DIAGNOSIS — M4807 Spinal stenosis, lumbosacral region: Secondary | ICD-10-CM | POA: Diagnosis not present

## 2015-11-15 DIAGNOSIS — M79674 Pain in right toe(s): Secondary | ICD-10-CM | POA: Diagnosis not present

## 2015-11-15 DIAGNOSIS — B351 Tinea unguium: Secondary | ICD-10-CM | POA: Diagnosis not present

## 2015-11-15 DIAGNOSIS — M79675 Pain in left toe(s): Secondary | ICD-10-CM | POA: Diagnosis not present

## 2015-11-18 DIAGNOSIS — G250 Essential tremor: Secondary | ICD-10-CM | POA: Diagnosis not present

## 2015-11-18 DIAGNOSIS — F0150 Vascular dementia without behavioral disturbance: Secondary | ICD-10-CM | POA: Diagnosis not present

## 2015-11-18 DIAGNOSIS — G629 Polyneuropathy, unspecified: Secondary | ICD-10-CM | POA: Diagnosis not present

## 2015-11-23 DIAGNOSIS — M5136 Other intervertebral disc degeneration, lumbar region: Secondary | ICD-10-CM | POA: Diagnosis not present

## 2015-11-23 DIAGNOSIS — M5416 Radiculopathy, lumbar region: Secondary | ICD-10-CM | POA: Diagnosis not present

## 2015-11-23 DIAGNOSIS — M4806 Spinal stenosis, lumbar region: Secondary | ICD-10-CM | POA: Diagnosis not present

## 2015-11-29 ENCOUNTER — Telehealth: Payer: Self-pay | Admitting: Family Medicine

## 2015-11-29 DIAGNOSIS — M4807 Spinal stenosis, lumbosacral region: Secondary | ICD-10-CM | POA: Diagnosis not present

## 2015-11-29 DIAGNOSIS — M6281 Muscle weakness (generalized): Secondary | ICD-10-CM | POA: Diagnosis not present

## 2015-11-29 NOTE — Telephone Encounter (Signed)
Pt taking Cymbalta BID per Dr Jeananne Rama instructions.  He is now out due to how the prescription was written.  [name] was told to call and let Izora Gala know how he did with the increase in meds.  Please call daughter [name].

## 2015-11-29 NOTE — Telephone Encounter (Signed)
Spoke with daughter, I told her according to patient's last note his Cymbalta was not BID only once.  I explained that [facility] was out until 12/29/15 and if he needed a new Rx for Cymbalta he would need to be seen.  She understood

## 2015-12-02 DIAGNOSIS — M5416 Radiculopathy, lumbar region: Secondary | ICD-10-CM | POA: Diagnosis not present

## 2015-12-02 DIAGNOSIS — M6281 Muscle weakness (generalized): Secondary | ICD-10-CM | POA: Diagnosis not present

## 2015-12-06 DIAGNOSIS — H401131 Primary open-angle glaucoma, bilateral, mild stage: Secondary | ICD-10-CM | POA: Diagnosis not present

## 2015-12-08 LAB — HM DIABETES EYE EXAM

## 2015-12-14 ENCOUNTER — Ambulatory Visit: Payer: PPO | Admitting: Family Medicine

## 2015-12-14 DIAGNOSIS — M6281 Muscle weakness (generalized): Secondary | ICD-10-CM | POA: Diagnosis not present

## 2015-12-16 ENCOUNTER — Encounter: Payer: Self-pay | Admitting: Family Medicine

## 2015-12-16 ENCOUNTER — Ambulatory Visit (INDEPENDENT_AMBULATORY_CARE_PROVIDER_SITE_OTHER): Payer: PPO | Admitting: Family Medicine

## 2015-12-16 VITALS — BP 122/58 | HR 52 | Temp 98.1°F | Wt 174.0 lb

## 2015-12-16 DIAGNOSIS — I10 Essential (primary) hypertension: Secondary | ICD-10-CM | POA: Diagnosis not present

## 2015-12-16 DIAGNOSIS — E119 Type 2 diabetes mellitus without complications: Secondary | ICD-10-CM | POA: Diagnosis not present

## 2015-12-16 LAB — BAYER DCA HB A1C WAIVED: HB A1C (BAYER DCA - WAIVED): 6.3 % (ref <7.0)

## 2015-12-16 NOTE — Progress Notes (Signed)
BP 122/58 mmHg  Pulse 52  Temp(Src) 98.1 F (36.7 C)  Wt 174 lb (78.926 kg)  SpO2 98%   Subjective:    Patient ID: Todd [name], male    DOB: 04-13-1931, 82 y.o.   MRN: [id number]  HPI: Todd [name] is a 82 y.o. male  Chief Complaint  Patient presents with  . Diabetes  . Hypertension   DIABETES Hypoglycemic episodes:no Polydipsia/polyuria: no Visual disturbance: no Chest pain: no Paresthesias: no Taking Insulin?: no Blood Pressure Monitoring: a few times a month Retinal Examination: Up to Date Foot Exam: Up to Date Diabetic Education: Not Completed Pneumovax: Up to Date Influenza: Up to Date Aspirin: yes  HYPERTENSION Hypertension status: controlled  Satisfied with current treatment? yes Duration of hypertension: chronic BP monitoring frequency:  not checking BP medication side effects:  no Medication compliance: excellent compliance Aspirin: yes Recurrent headaches: no Visual changes: no Palpitations: no Dyspnea: no Chest pain: no Lower extremity edema: no Dizzy/lightheaded: no  Relevant past medical, surgical, family and social history reviewed and updated as indicated. Interim medical history since our last visit reviewed. Allergies and medications reviewed and updated.  Review of Systems  Constitutional: Negative.   Respiratory: Negative.   Cardiovascular: Negative.   Psychiatric/Behavioral: Negative.     Per HPI unless specifically indicated above     Objective:    BP 122/58 mmHg  Pulse 52  Temp(Src) 98.1 F (36.7 C)  Wt 174 lb (78.926 kg)  SpO2 98%  Wt Readings from Last 3 Encounters:  12/16/15 174 lb (78.926 kg)  11/09/15 172 lb 1.6 oz (78.064 kg)  10/11/15 175 lb (79.379 kg)    Physical Exam  Constitutional: He is oriented to person, place, and time. He appears well-developed and well-nourished. No distress.  HENT:  Head: Normocephalic and atraumatic.  Right Ear: Hearing normal.  Left Ear: Hearing normal.  Nose: Nose normal.    Eyes: Conjunctivae and lids are normal. Right eye exhibits no discharge. Left eye exhibits no discharge. No scleral icterus.  Cardiovascular: Normal rate, regular rhythm, normal heart sounds and intact distal pulses.  Exam reveals no gallop and no friction rub.   No murmur heard. Pulmonary/Chest: Effort normal and breath sounds normal. No respiratory distress. He has no wheezes. He has no rales. He exhibits no tenderness.  Musculoskeletal: Normal range of motion.  Neurological: He is alert and oriented to person, place, and time.  Skin: Skin is warm, dry and intact. No rash noted. No erythema. No pallor.  Psychiatric: He has a normal mood and affect. His speech is normal and behavior is normal. Judgment and thought content normal. Cognition and memory are normal.  Nursing note and vitals reviewed.   Results for orders placed or performed in visit on 12/08/15  HM DIABETES EYE EXAM  Result Value Ref Range   HM Diabetic Eye Exam No Retinopathy No Retinopathy      Assessment & Plan:   Problem List Items Addressed This Visit      Cardiovascular and Mediastinum   Hypertension    Under good control when standing up. Continue current regimen. Continue to monitor. Call with any concerns.         Endocrine   Diabetes mellitus without complication ([place]) - Primary    Stable. Continue current regimen. Continue to monitor. Call with any concerns.       Relevant Orders   Bayer DCA Hb A1c Waived       Follow up plan: Return in about 3 months (  around 03/17/2016) for 6 month follow up.

## 2015-12-16 NOTE — Assessment & Plan Note (Signed)
Under good control when standing up. Continue current regimen. Continue to monitor. Call with any concerns.

## 2015-12-16 NOTE — Assessment & Plan Note (Signed)
Stable. Continue current regimen. Continue to monitor. Call with any concerns.  

## 2015-12-21 DIAGNOSIS — M4807 Spinal stenosis, lumbosacral region: Secondary | ICD-10-CM | POA: Diagnosis not present

## 2015-12-21 DIAGNOSIS — M6281 Muscle weakness (generalized): Secondary | ICD-10-CM | POA: Diagnosis not present

## 2016-01-06 DIAGNOSIS — G8929 Other chronic pain: Secondary | ICD-10-CM | POA: Diagnosis not present

## 2016-01-06 DIAGNOSIS — M25512 Pain in left shoulder: Secondary | ICD-10-CM | POA: Diagnosis not present

## 2016-01-06 DIAGNOSIS — M19012 Primary osteoarthritis, left shoulder: Secondary | ICD-10-CM | POA: Diagnosis not present

## 2016-01-19 DIAGNOSIS — M4806 Spinal stenosis, lumbar region: Secondary | ICD-10-CM | POA: Diagnosis not present

## 2016-01-19 DIAGNOSIS — M5416 Radiculopathy, lumbar region: Secondary | ICD-10-CM | POA: Diagnosis not present

## 2016-01-19 DIAGNOSIS — M5136 Other intervertebral disc degeneration, lumbar region: Secondary | ICD-10-CM | POA: Diagnosis not present

## 2016-03-01 DIAGNOSIS — M5416 Radiculopathy, lumbar region: Secondary | ICD-10-CM | POA: Diagnosis not present

## 2016-03-01 DIAGNOSIS — M5136 Other intervertebral disc degeneration, lumbar region: Secondary | ICD-10-CM | POA: Diagnosis not present

## 2016-03-01 DIAGNOSIS — M4806 Spinal stenosis, lumbar region: Secondary | ICD-10-CM | POA: Diagnosis not present

## 2016-03-15 ENCOUNTER — Ambulatory Visit (INDEPENDENT_AMBULATORY_CARE_PROVIDER_SITE_OTHER): Payer: PPO | Admitting: Family Medicine

## 2016-03-15 ENCOUNTER — Encounter: Payer: Self-pay | Admitting: Family Medicine

## 2016-03-15 VITALS — BP 158/66 | Temp 98.0°F | Ht 67.0 in | Wt 173.8 lb

## 2016-03-15 DIAGNOSIS — Z23 Encounter for immunization: Secondary | ICD-10-CM | POA: Diagnosis not present

## 2016-03-15 DIAGNOSIS — F419 Anxiety disorder, unspecified: Secondary | ICD-10-CM | POA: Diagnosis not present

## 2016-03-15 DIAGNOSIS — E119 Type 2 diabetes mellitus without complications: Secondary | ICD-10-CM

## 2016-03-15 DIAGNOSIS — F3342 Major depressive disorder, recurrent, in full remission: Secondary | ICD-10-CM

## 2016-03-15 LAB — BAYER DCA HB A1C WAIVED: HB A1C (BAYER DCA - WAIVED): 6.2 % (ref <7.0)

## 2016-03-15 MED ORDER — RAMIPRIL 10 MG PO CAPS: 10.0000 mg | ORAL_CAPSULE | Freq: Two times a day (BID) | ORAL | 2 refills | Status: DC

## 2016-03-15 MED ORDER — DULOXETINE HCL 20 MG PO CPEP: 20.0000 mg | ORAL_CAPSULE | Freq: Every day | ORAL | 2 refills | Status: DC

## 2016-03-15 MED ORDER — METOPROLOL SUCCINATE ER 50 MG PO TB24: 50.0000 mg | ORAL_TABLET | Freq: Every day | ORAL | 2 refills | Status: DC

## 2016-03-15 MED ORDER — METFORMIN HCL 500 MG PO TABS: 500.0000 mg | ORAL_TABLET | Freq: Two times a day (BID) | ORAL | 4 refills | Status: DC

## 2016-03-15 NOTE — Assessment & Plan Note (Signed)
The current medical regimen is effective;  continue present plan and medications.  

## 2016-03-15 NOTE — Progress Notes (Signed)
BP (!) 158/66 (BP Location: Left Arm, Patient Position: Sitting, Cuff Size: Normal)   Temp 98 F (36.7 C)   Ht 5\' 7"  (1.702 m)   Wt 173 lb 12.8 oz (78.8 kg)   SpO2 95%   BMI 27.22 kg/m    Subjective:    Patient ID: Todd [name], male    DOB: 31-May-1931, 82 y.o.   MRN: [id number]  HPI: Todd [name] is a 82 y.o. male  Chief Complaint  Patient presents with  . Follow-up  . Diabetes  . Cough  . Nasal Congestion   Patient follow-up doing well with diabetes hypertension no complaints from medications taking faithfully no low blood sugar spells Patient is accompanied by his daughter. Has developed over the last 2 days of mild head cold with some congestion and cough yesterday seemed to already be better and maybe better today. No fever or chills  Relevant past medical, surgical, family and social history reviewed and updated as indicated. Interim medical history since our last visit reviewed. Allergies and medications reviewed and updated.  Review of Systems  Constitutional: Negative.  Negative for diaphoresis and fever.  HENT: Positive for postnasal drip.   Respiratory: Positive for cough. Negative for choking, chest tightness and shortness of breath.   Cardiovascular: Negative.     Per HPI unless specifically indicated above     Objective:    BP (!) 158/66 (BP Location: Left Arm, Patient Position: Sitting, Cuff Size: Normal)   Temp 98 F (36.7 C)   Ht 5\' 7"  (1.702 m)   Wt 173 lb 12.8 oz (78.8 kg)   SpO2 95%   BMI 27.22 kg/m   Wt Readings from Last 3 Encounters:  03/15/16 173 lb 12.8 oz (78.8 kg)  12/16/15 174 lb (78.9 kg)  11/09/15 172 lb 1.6 oz (78.1 kg)    Physical Exam  Constitutional: He is oriented to person, place, and time. He appears well-developed and well-nourished. No distress.  HENT:  Head: Normocephalic and atraumatic.  Right Ear: Hearing normal.  Left Ear: Hearing normal.  Nose: Nose normal.  Eyes: Conjunctivae and lids are normal. Right eye  exhibits no discharge. Left eye exhibits no discharge. No scleral icterus.  Cardiovascular: Normal rate, regular rhythm and normal heart sounds.   Pulmonary/Chest: Effort normal and breath sounds normal. No respiratory distress.  Musculoskeletal: Normal range of motion.  Neurological: He is alert and oriented to person, place, and time.  Skin: Skin is intact. No rash noted.  Psychiatric: He has a normal mood and affect. His speech is normal and behavior is normal. Judgment and thought content normal. Cognition and memory are normal.    Results for orders placed or performed in visit on 12/16/15  Bayer DCA Hb A1c Waived  Result Value Ref Range   Bayer DCA Hb A1c Waived 6.3 <7.0 %      Assessment & Plan:   Problem List Items Addressed This Visit      Endocrine   Diabetes mellitus without complication ([place]) - Primary    The current medical regimen is effective;  continue present plan and medications.       Relevant Medications   ramipril (ALTACE) 10 MG capsule   metFORMIN (GLUCOPHAGE) 500 MG tablet   Other Relevant Orders   Bayer DCA Hb A1c Waived   Basic metabolic panel     Other   Depression    The current medical regimen is effective;  continue present plan and medications.  Relevant Medications   DULoxetine (CYMBALTA) 20 MG capsule   Anxiety    The current medical regimen is effective;  continue present plan and medications.       Relevant Medications   DULoxetine (CYMBALTA) 20 MG capsule    Other Visit Diagnoses   None.      Follow up plan: Return in about 3 months (around 06/15/2016) for Hemoglobin A1c.

## 2016-03-15 NOTE — Addendum Note (Signed)
Addended by: Sandria Manly on: 03/15/2016 02:37 PM   Modules accepted: Orders

## 2016-03-16 ENCOUNTER — Ambulatory Visit: Payer: PPO | Admitting: Family Medicine

## 2016-03-16 LAB — BASIC METABOLIC PANEL
BUN/Creatinine Ratio: 19 (ref 10–24)
BUN: 19 mg/dL (ref 8–27)
CO2: 29 mmol/L (ref 18–29)
Calcium: 9.5 mg/dL (ref 8.6–10.2)
Chloride: 101 mmol/L (ref 96–106)
Creatinine, Ser: 0.99 mg/dL (ref 0.76–1.27)
GFR calc Af Amer: 81 mL/min/{1.73_m2} (ref >59)
GFR calc non Af Amer: 70 mL/min/{1.73_m2} (ref >59)
Glucose: 137 mg/dL — ABNORMAL HIGH (ref 65–99)
Potassium: 5.4 mmol/L — ABNORMAL HIGH (ref 3.5–5.2)
Sodium: 144 mmol/L (ref 134–144)

## 2016-03-21 ENCOUNTER — Telehealth: Payer: Self-pay | Admitting: Family Medicine

## 2016-03-21 DIAGNOSIS — E875 Hyperkalemia: Secondary | ICD-10-CM

## 2016-03-21 NOTE — Telephone Encounter (Signed)
Phone call Discussed with patient slight elevation potassium will recheck BMP next office visit. Patient will decrease potassium rich foods

## 2016-03-31 DIAGNOSIS — M5416 Radiculopathy, lumbar region: Secondary | ICD-10-CM | POA: Diagnosis not present

## 2016-03-31 DIAGNOSIS — M48062 Spinal stenosis, lumbar region with neurogenic claudication: Secondary | ICD-10-CM | POA: Diagnosis not present

## 2016-03-31 DIAGNOSIS — M5136 Other intervertebral disc degeneration, lumbar region: Secondary | ICD-10-CM | POA: Diagnosis not present

## 2016-04-21 DIAGNOSIS — G629 Polyneuropathy, unspecified: Secondary | ICD-10-CM | POA: Diagnosis not present

## 2016-04-21 DIAGNOSIS — G250 Essential tremor: Secondary | ICD-10-CM | POA: Diagnosis not present

## 2016-04-21 DIAGNOSIS — F0150 Vascular dementia without behavioral disturbance: Secondary | ICD-10-CM | POA: Diagnosis not present

## 2016-04-21 DIAGNOSIS — G5602 Carpal tunnel syndrome, left upper limb: Secondary | ICD-10-CM | POA: Diagnosis not present

## 2016-04-26 ENCOUNTER — Ambulatory Visit: Payer: Self-pay | Admitting: Family Medicine

## 2016-04-28 ENCOUNTER — Encounter: Payer: Self-pay | Admitting: Family Medicine

## 2016-04-28 ENCOUNTER — Ambulatory Visit (INDEPENDENT_AMBULATORY_CARE_PROVIDER_SITE_OTHER): Payer: PPO | Admitting: Family Medicine

## 2016-04-28 VITALS — BP 167/79 | HR 59 | Temp 97.5°F | Wt 175.0 lb

## 2016-04-28 DIAGNOSIS — I10 Essential (primary) hypertension: Secondary | ICD-10-CM

## 2016-04-28 MED ORDER — TELMISARTAN 80 MG PO TABS: 80.0000 mg | ORAL_TABLET | Freq: Every day | ORAL | 1 refills | Status: DC

## 2016-04-28 NOTE — Assessment & Plan Note (Addendum)
Not under good control. Did better on telmasartan. Rx sent to his pharmacy. Will recheck BP in 2-3 weeks, may need HCTZ added as he's coming off his metoprolol. Continue to monitor.

## 2016-04-28 NOTE — Progress Notes (Signed)
BP (!) 167/79 (BP Location: Left Arm, Cuff Size: Normal)   Pulse (!) 59   Temp 97.5 F (36.4 C)   Wt 175 lb (79.4 kg)   SpO2 100%   BMI 27.41 kg/m    Subjective:    Patient ID: Todd [name], male    DOB: 1930/12/06, 81 y.o.   MRN: [id number]  HPI: Todd [name] is a 82 y.o. male  Chief Complaint  Patient presents with  . Hypertension    Patient went to Neuro, he wll be stopping his Metoprolol and Primidone and starting, propranlol   HYPERTENSION Hypertension status: uncontrolled  Satisfied with current treatment? no Duration of hypertension: chronic BP monitoring frequency:  a few times a month BP range: "high" BP medication side effects:  no Medication compliance: excellent compliance Previous BP meds: metoprolol, altace Aspirin: yes Recurrent headaches: no Visual changes: no Palpitations: no Dyspnea: no Chest pain: no Lower extremity edema: no Dizzy/lightheaded: no  Relevant past medical, surgical, family and social history reviewed and updated as indicated. Interim medical history since our last visit reviewed. Allergies and medications reviewed and updated.  Review of Systems  Constitutional: Negative.   Respiratory: Negative.   Cardiovascular: Negative.   Psychiatric/Behavioral: Negative.     Per HPI unless specifically indicated above     Objective:    BP (!) 167/79 (BP Location: Left Arm, Cuff Size: Normal)   Pulse (!) 59   Temp 97.5 F (36.4 C)   Wt 175 lb (79.4 kg)   SpO2 100%   BMI 27.41 kg/m   Wt Readings from Last 3 Encounters:  04/28/16 175 lb (79.4 kg)  03/15/16 173 lb 12.8 oz (78.8 kg)  12/16/15 174 lb (78.9 kg)    Physical Exam  Constitutional: He is oriented to person, place, and time. He appears well-developed and well-nourished. No distress.  HENT:  Head: Normocephalic and atraumatic.  Right Ear: Hearing normal.  Left Ear: Hearing normal.  Nose: Nose normal.  Eyes: Conjunctivae and lids are normal. Right eye exhibits no  discharge. Left eye exhibits no discharge. No scleral icterus.  Cardiovascular: Normal rate, regular rhythm, normal heart sounds and intact distal pulses.  Exam reveals no gallop and no friction rub.   No murmur heard. Pulmonary/Chest: Effort normal and breath sounds normal. No respiratory distress. He has no wheezes. He has no rales. He exhibits no tenderness.  Musculoskeletal: Normal range of motion.  Neurological: He is alert and oriented to person, place, and time.  Skin: Skin is warm, dry and intact. No rash noted. No erythema. No pallor.  Psychiatric: He has a normal mood and affect. His speech is normal and behavior is normal. Judgment and thought content normal. Cognition and memory are normal.  Nursing note and vitals reviewed.   Results for orders placed or performed in visit on 03/15/16  Bayer DCA Hb A1c Waived  Result Value Ref Range   Bayer DCA Hb A1c Waived 6.2 Q000111Q %  Basic metabolic panel  Result Value Ref Range   Glucose 137 (H) 65 - 99 mg/dL   BUN 19 8 - 27 mg/dL   Creatinine, Ser 0.99 0.76 - 1.27 mg/dL   GFR calc non Af Amer 70 >59 mL/min/1.73   GFR calc Af Amer 81 >59 mL/min/1.73   BUN/Creatinine Ratio 19 10 - 24   Sodium 144 134 - 144 mmol/L   Potassium 5.4 (H) 3.5 - 5.2 mmol/L   Chloride 101 96 - 106 mmol/L   CO2 29 18 - 29  mmol/L   Calcium 9.5 8.6 - 10.2 mg/dL      Assessment & Plan:   Problem List Items Addressed This Visit      Cardiovascular and Mediastinum   Hypertension - Primary    Not under good control. Did better on telmasartan. Rx sent to his pharmacy. Will recheck BP in 2-3 weeks, may need HCTZ added as he's coming off his metoprolol. Continue to monitor.       Relevant Medications   propranolol (INDERAL) 10 MG tablet (Start on 05/05/2016)   telmisartan (MICARDIS) 80 MG tablet       Follow up plan: Return 2-3 weeks, for BP check.

## 2016-05-03 DIAGNOSIS — M79675 Pain in left toe(s): Secondary | ICD-10-CM | POA: Diagnosis not present

## 2016-05-03 DIAGNOSIS — M79674 Pain in right toe(s): Secondary | ICD-10-CM | POA: Diagnosis not present

## 2016-05-03 DIAGNOSIS — B351 Tinea unguium: Secondary | ICD-10-CM | POA: Diagnosis not present

## 2016-06-21 ENCOUNTER — Ambulatory Visit: Payer: Self-pay | Admitting: Family Medicine

## 2016-06-30 DIAGNOSIS — H401131 Primary open-angle glaucoma, bilateral, mild stage: Secondary | ICD-10-CM | POA: Diagnosis not present

## 2016-07-10 ENCOUNTER — Encounter: Payer: Self-pay | Admitting: Family Medicine

## 2016-07-10 ENCOUNTER — Ambulatory Visit (INDEPENDENT_AMBULATORY_CARE_PROVIDER_SITE_OTHER): Payer: PPO | Admitting: Family Medicine

## 2016-07-10 VITALS — BP 134/62 | HR 63 | Ht 67.0 in | Wt 190.0 lb

## 2016-07-10 DIAGNOSIS — E119 Type 2 diabetes mellitus without complications: Secondary | ICD-10-CM | POA: Diagnosis not present

## 2016-07-10 DIAGNOSIS — F419 Anxiety disorder, unspecified: Secondary | ICD-10-CM | POA: Diagnosis not present

## 2016-07-10 DIAGNOSIS — I10 Essential (primary) hypertension: Secondary | ICD-10-CM | POA: Diagnosis not present

## 2016-07-10 NOTE — Assessment & Plan Note (Signed)
Accompanied by some expressive aphasia problems that goes away with distraction. Will observe for now

## 2016-07-10 NOTE — Assessment & Plan Note (Signed)
The current medical regimen is effective;  continue present plan and medications.  

## 2016-07-10 NOTE — Progress Notes (Signed)
BP 134/62 (BP Location: Left Arm)   Pulse 63   Ht 5\' 7"  (1.702 m)   Wt 190 lb (86.2 kg)   SpO2 99%   BMI 29.76 kg/m    Subjective:    Patient ID: Todd [name], male    DOB: Oct 07, 1930, 83 y.o.   MRN: [id number]  HPI: Sadiki Lollie is a 83 y.o. male  Chief Complaint  Patient presents with  . Follow-up  . Diabetes  . Hypertension   Patient accompanied by daughter who assists with history patient primarily concerned about expressive aphasia. Reviewed patient's notes from neurology and addressed with neurology with blood work and medication adjustment no real change. Noticed on review with patient that after distraction patient did better with very limited to no expressive aphasia was able to talk normally but with direct questioning did poorly. Diabetes no complaints from low blood sugar medications. Blood pressure doing well complaints from medications.  Relevant past medical, surgical, family and social history reviewed and updated as indicated. Interim medical history since our last visit reviewed. Allergies and medications reviewed and updated.  Review of Systems  Constitutional: Negative.   Respiratory: Negative.   Cardiovascular: Negative.     Per HPI unless specifically indicated above     Objective:    BP 134/62 (BP Location: Left Arm)   Pulse 63   Ht 5\' 7"  (1.702 m)   Wt 190 lb (86.2 kg)   SpO2 99%   BMI 29.76 kg/m   Wt Readings from Last 3 Encounters:  07/10/16 190 lb (86.2 kg)  04/28/16 175 lb (79.4 kg)  03/15/16 173 lb 12.8 oz (78.8 kg)    Physical Exam  Constitutional: He is oriented to person, place, and time. He appears well-developed and well-nourished. No distress.  HENT:  Head: Normocephalic and atraumatic.  Right Ear: Hearing normal.  Left Ear: Hearing normal.  Nose: Nose normal.  Eyes: Conjunctivae and lids are normal. Right eye exhibits no discharge. Left eye exhibits no discharge. No scleral icterus.  Cardiovascular: Normal rate, regular  rhythm and normal heart sounds.   Pulmonary/Chest: Effort normal. No respiratory distress.  Musculoskeletal: Normal range of motion.  Neurological: He is alert and oriented to person, place, and time.  Skin: Skin is intact. No rash noted.  Psychiatric: He has a normal mood and affect. His speech is normal and behavior is normal. Judgment and thought content normal. Cognition and memory are normal.    Results for orders placed or performed in visit on 03/15/16  Bayer DCA Hb A1c Waived  Result Value Ref Range   Bayer DCA Hb A1c Waived 6.2 Q000111Q %  Basic metabolic panel  Result Value Ref Range   Glucose 137 (H) 65 - 99 mg/dL   BUN 19 8 - 27 mg/dL   Creatinine, Ser 0.99 0.76 - 1.27 mg/dL   GFR calc non Af Amer 70 >59 mL/min/1.73   GFR calc Af Amer 81 >59 mL/min/1.73   BUN/Creatinine Ratio 19 10 - 24   Sodium 144 134 - 144 mmol/L   Potassium 5.4 (H) 3.5 - 5.2 mmol/L   Chloride 101 96 - 106 mmol/L   CO2 29 18 - 29 mmol/L   Calcium 9.5 8.6 - 10.2 mg/dL      Assessment & Plan:   Problem List Items Addressed This Visit      Cardiovascular and Mediastinum   Hypertension    The current medical regimen is effective;  continue present plan and medications.  Endocrine   Diabetes mellitus without complication ([place]) - Primary    The current medical regimen is effective;  continue present plan and medications.       Relevant Orders   Bayer DCA Hb A1c Waived (STAT)     Other   Anxiety    Accompanied by some expressive aphasia problems that goes away with distraction. Will observe for now          Follow up plan: Return in about 3 months (around 10/07/2016) for Hemoglobin A1c, BMP.

## 2016-07-11 LAB — BAYER DCA HB A1C WAIVED: HB A1C (BAYER DCA - WAIVED): 6.3 % (ref <7.0)

## 2016-07-21 DIAGNOSIS — G5600 Carpal tunnel syndrome, unspecified upper limb: Secondary | ICD-10-CM | POA: Diagnosis not present

## 2016-07-21 DIAGNOSIS — F411 Generalized anxiety disorder: Secondary | ICD-10-CM | POA: Diagnosis not present

## 2016-07-21 DIAGNOSIS — G479 Sleep disorder, unspecified: Secondary | ICD-10-CM | POA: Diagnosis not present

## 2016-07-21 DIAGNOSIS — F0150 Vascular dementia without behavioral disturbance: Secondary | ICD-10-CM | POA: Diagnosis not present

## 2016-07-21 DIAGNOSIS — G250 Essential tremor: Secondary | ICD-10-CM | POA: Diagnosis not present

## 2016-08-31 DIAGNOSIS — T8482XD Fibrosis due to internal orthopedic prosthetic devices, implants and grafts, subsequent encounter: Secondary | ICD-10-CM | POA: Diagnosis not present

## 2016-08-31 DIAGNOSIS — Z96652 Presence of left artificial knee joint: Secondary | ICD-10-CM | POA: Diagnosis not present

## 2016-09-12 DIAGNOSIS — M25662 Stiffness of left knee, not elsewhere classified: Secondary | ICD-10-CM | POA: Diagnosis not present

## 2016-09-12 DIAGNOSIS — M6281 Muscle weakness (generalized): Secondary | ICD-10-CM | POA: Diagnosis not present

## 2016-09-12 DIAGNOSIS — M24659 Ankylosis, unspecified hip: Secondary | ICD-10-CM | POA: Diagnosis not present

## 2016-09-12 DIAGNOSIS — M25661 Stiffness of right knee, not elsewhere classified: Secondary | ICD-10-CM | POA: Diagnosis not present

## 2016-09-15 ENCOUNTER — Other Ambulatory Visit: Payer: Self-pay | Admitting: Family Medicine

## 2016-09-19 DIAGNOSIS — M25661 Stiffness of right knee, not elsewhere classified: Secondary | ICD-10-CM | POA: Diagnosis not present

## 2016-09-19 DIAGNOSIS — M6281 Muscle weakness (generalized): Secondary | ICD-10-CM | POA: Diagnosis not present

## 2016-09-19 DIAGNOSIS — M25662 Stiffness of left knee, not elsewhere classified: Secondary | ICD-10-CM | POA: Diagnosis not present

## 2016-09-25 ENCOUNTER — Ambulatory Visit: Payer: Self-pay | Admitting: Family Medicine

## 2016-09-26 DIAGNOSIS — M25661 Stiffness of right knee, not elsewhere classified: Secondary | ICD-10-CM | POA: Diagnosis not present

## 2016-09-26 DIAGNOSIS — M25662 Stiffness of left knee, not elsewhere classified: Secondary | ICD-10-CM | POA: Diagnosis not present

## 2016-09-26 DIAGNOSIS — M6281 Muscle weakness (generalized): Secondary | ICD-10-CM | POA: Diagnosis not present

## 2016-09-27 ENCOUNTER — Ambulatory Visit (INDEPENDENT_AMBULATORY_CARE_PROVIDER_SITE_OTHER): Payer: PPO | Admitting: Family Medicine

## 2016-09-27 ENCOUNTER — Encounter: Payer: Self-pay | Admitting: Family Medicine

## 2016-09-27 VITALS — BP 167/69 | HR 57 | Temp 97.7°F | Wt 181.0 lb

## 2016-09-27 DIAGNOSIS — F411 Generalized anxiety disorder: Secondary | ICD-10-CM | POA: Diagnosis not present

## 2016-09-27 MED ORDER — CLONAZEPAM 0.5 MG PO TABS: 0.5000 mg | ORAL_TABLET | Freq: Every day | ORAL | 0 refills | Status: DC | PRN

## 2016-09-27 NOTE — Assessment & Plan Note (Signed)
Restart klonopin 0.5 QD prn. Discussed risks and benefits, side effect precautions. Continue cymbalta.

## 2016-09-27 NOTE — Patient Instructions (Signed)
Follow up as needed

## 2016-09-27 NOTE — Progress Notes (Signed)
   BP (!) 167/69   Pulse (!) 57   Temp 97.7 F (36.5 C)   Wt 181 lb (82.1 kg)   SpO2 98%   BMI 28.35 kg/m    Subjective:    Patient ID: Todd [name], male    DOB: Jan 05, 1931, 83 y.o.   MRN: [id number]  HPI: Todd [name] is a 83 y.o. male  Chief Complaint  Patient presents with  . Anxiety    Has been on Clonazepam 0.5mg  in the past and would like that refilled.    Patient presents to discuss worsening generalized anxiety sxs. States this has been an issue for years, but feels it is now affecting his daily life significantly. Feeling exceedingly nervous daily, lasting most of the day. No specific triggers. Has some expressive aphasia from his anxiousness and his hand tremors are to the point where he can hardly feed himself when nervous. Was previously on 0.5 klonopin daily with good relief in addition to his cymbalta. Would like to resume taking the medication. Followed by Neurology and currently taking propranolol with mild relief of tremors.   Relevant past medical, surgical, family and social history reviewed and updated as indicated. Interim medical history since our last visit reviewed. Allergies and medications reviewed and updated.  Review of Systems  Constitutional: Negative.   HENT: Negative.   Eyes: Negative.   Respiratory: Negative.   Cardiovascular: Negative.   Gastrointestinal: Negative.   Genitourinary: Negative.   Musculoskeletal: Negative.   Skin: Negative.   Neurological: Positive for tremors and speech difficulty.  Psychiatric/Behavioral: The patient is nervous/anxious.     Per HPI unless specifically indicated above     Objective:    BP (!) 167/69   Pulse (!) 57   Temp 97.7 F (36.5 C)   Wt 181 lb (82.1 kg)   SpO2 98%   BMI 28.35 kg/m   Wt Readings from Last 3 Encounters:  09/27/16 181 lb (82.1 kg)  07/10/16 190 lb (86.2 kg)  04/28/16 175 lb (79.4 kg)    Physical Exam  Constitutional: He is oriented to person, place, and time. He appears  well-developed and well-nourished.  HENT:  Head: Atraumatic.  Eyes: Conjunctivae are normal. Pupils are equal, round, and reactive to light.  Neck: Normal range of motion. Neck supple.  Cardiovascular: Normal rate and normal heart sounds.   Pulmonary/Chest: Effort normal and breath sounds normal.  Musculoskeletal: Normal range of motion.  Neurological: He is alert and oriented to person, place, and time.  Essential tremor present b/l hands Speech slow but sensible  Skin: Skin is warm and dry.  Psychiatric: He has a normal mood and affect. His behavior is normal.  Nursing note and vitals reviewed.     Assessment & Plan:   Problem List Items Addressed This Visit      Other   Anxiety, generalized - Primary    Restart klonopin 0.5 QD prn. Discussed risks and benefits, side effect precautions. Continue cymbalta.          Follow up plan: Return in about 3 months (around 12/27/2016) for A1C, anxiety.

## 2016-09-28 ENCOUNTER — Telehealth: Payer: Self-pay | Admitting: Family Medicine

## 2016-09-28 NOTE — Telephone Encounter (Signed)
Call daughter

## 2016-09-28 NOTE — Telephone Encounter (Signed)
Pts daughter would like to speak to PCP about busPIRone (BUSPAR) 10 MG tablet and celecoxib (CELEBREX) 200 MG capsule. She would like to know if one of these medications are raising his blood pressure.   She would also like to speak to PCP about getting naproxen extended releases.

## 2016-09-28 NOTE — Telephone Encounter (Addendum)
Daughter is not on DPR. DPR only lists, Sharion Dove [phone].   Pt was seen yesterday by Apolonio Schneiders. BP was 167/69.

## 2016-09-28 NOTE — Telephone Encounter (Signed)
Phone call Discussed with daughter medication and possible interactions interested in reducing BuSpar and Celebrex from twice a day to just one each day. Which is okay will observe response.

## 2016-10-02 ENCOUNTER — Encounter: Payer: Self-pay | Admitting: Family Medicine

## 2016-10-02 ENCOUNTER — Ambulatory Visit (INDEPENDENT_AMBULATORY_CARE_PROVIDER_SITE_OTHER): Payer: PPO | Admitting: Family Medicine

## 2016-10-02 VITALS — BP 129/82 | Temp 97.4°F | Wt 182.0 lb

## 2016-10-02 DIAGNOSIS — E119 Type 2 diabetes mellitus without complications: Secondary | ICD-10-CM | POA: Diagnosis not present

## 2016-10-02 DIAGNOSIS — I10 Essential (primary) hypertension: Secondary | ICD-10-CM

## 2016-10-02 DIAGNOSIS — F411 Generalized anxiety disorder: Secondary | ICD-10-CM

## 2016-10-02 DIAGNOSIS — G250 Essential tremor: Secondary | ICD-10-CM

## 2016-10-02 NOTE — Assessment & Plan Note (Signed)
Expressive and generalized slowness possibly aggravated by gabapentin. Gave written directions about decreasing gabapentin from 1 pill morning And 3 at bedtime to 1 pill morning and noon and 2 at bedtime for 3 days then 1 pill morning and noon and night. During this time will observe tremor and expressive slowness to see if any changes accomplished.

## 2016-10-02 NOTE — Assessment & Plan Note (Signed)
Relatively stable at present

## 2016-10-02 NOTE — Progress Notes (Signed)
BP 129/82   Temp 97.4 F (36.3 C)   Wt 182 lb (82.6 kg)   SpO2 99%   BMI 28.51 kg/m    Subjective:    Patient ID: Todd [name], male    DOB: 1930-12-18, 83 y.o.   MRN: [id number]  HPI: [name] is a 83 y.o. male  Chief Complaint  Patient presents with  . Diabetes  . Hypertension    We reduced his Buspar and Celebrex to once a day to see if that was causing his BP issues.   Patient accompanied by his daughter who helps with history. Concerned about reduce haven't noticed any difference with tremor. Or expressive slowness and difficulty. Patient certainly has had problems with expressive difficulty and slowness. Tremor seems to be doing okay. Relevant past medical, surgical, family and social history reviewed and updated as indicated. Interim medical history since our last visit reviewed. Allergies and medications reviewed and updated.  Review of Systems  Constitutional: Negative.   Respiratory: Negative.   Cardiovascular: Negative.     Per HPI unless specifically indicated above     Objective:    BP 129/82   Temp 97.4 F (36.3 C)   Wt 182 lb (82.6 kg)   SpO2 99%   BMI 28.51 kg/m   Wt Readings from Last 3 Encounters:  10/02/16 182 lb (82.6 kg)  09/27/16 181 lb (82.1 kg)  07/10/16 190 lb (86.2 kg)    Physical Exam  Constitutional: He is oriented to person, place, and time. He appears well-developed and well-nourished.  HENT:  Head: Normocephalic and atraumatic.  Eyes: Conjunctivae and EOM are normal.  Neck: Normal range of motion.  Cardiovascular: Normal rate, regular rhythm and normal heart sounds.   Pulmonary/Chest: Effort normal and breath sounds normal.  Musculoskeletal: Normal range of motion.  Neurological: He is alert and oriented to person, place, and time.  Very mild hand tremor but expressive slowness and difficulty with speech  Skin: No erythema.  Psychiatric: He has a normal mood and affect. His behavior is normal. Judgment and thought  content normal.    Results for orders placed or performed in visit on 07/10/16  Bayer DCA Hb A1c Waived (STAT)  Result Value Ref Range   Bayer DCA Hb A1c Waived 6.3 <7.0 %      Assessment & Plan:   Problem List Items Addressed This Visit      Cardiovascular and Mediastinum   Hypertension    The current medical regimen is effective;  continue present plan and medications.       Relevant Orders   Bayer DCA Hb A1c Waived   Basic metabolic panel     Endocrine   Diabetes mellitus without complication ([facility]) - Primary   Relevant Orders   Bayer DCA Hb A1c Waived   Basic metabolic panel     Nervous and Auditory   Benign essential tremor    Relatively stable at present        Other   Anxiety, generalized    Expressive and generalized slowness possibly aggravated by gabapentin. Gave written directions about decreasing gabapentin from 1 pill morning And 3 at bedtime to 1 pill morning and noon and 2 at bedtime for 3 days then 1 pill morning and noon and night. During this time will observe tremor and expressive slowness to see if any changes accomplished.          Follow up plan: Return in about 4 weeks (around 10/30/2016).

## 2016-10-02 NOTE — Assessment & Plan Note (Signed)
The current medical regimen is effective;  continue present plan and medications.  

## 2016-10-03 ENCOUNTER — Telehealth: Payer: Self-pay | Admitting: Family Medicine

## 2016-10-03 ENCOUNTER — Encounter: Payer: Self-pay | Admitting: Family Medicine

## 2016-10-03 LAB — BASIC METABOLIC PANEL
BUN/Creatinine Ratio: 20 (ref 10–24)
BUN: 20 mg/dL (ref 8–27)
CO2: 27 mmol/L (ref 18–29)
Calcium: 9.4 mg/dL (ref 8.6–10.2)
Chloride: 103 mmol/L (ref 96–106)
Creatinine, Ser: 1 mg/dL (ref 0.76–1.27)
GFR calc Af Amer: 80 mL/min/{1.73_m2} (ref >59)
GFR calc non Af Amer: 69 mL/min/{1.73_m2} (ref >59)
Glucose: 133 mg/dL — ABNORMAL HIGH (ref 65–99)
Potassium: 4.9 mmol/L (ref 3.5–5.2)
Sodium: 143 mmol/L (ref 134–144)

## 2016-10-03 LAB — BAYER DCA HB A1C WAIVED: HB A1C (BAYER DCA - WAIVED): 5.9 % (ref <7.0)

## 2016-10-03 NOTE — Telephone Encounter (Signed)
Call transferred to provider.

## 2016-10-03 NOTE — Telephone Encounter (Signed)
Patient's daughter, Anant Agard called and wanted some advice on the medication instructions from Patient's last visit. Patient and daughter want to know how the provider would like for patient to Banks himself off of a specific medication. Please Advise.  Elvyn Krohn phone number: [phone]  Thank you

## 2016-10-06 DIAGNOSIS — B351 Tinea unguium: Secondary | ICD-10-CM | POA: Diagnosis not present

## 2016-10-06 DIAGNOSIS — E1140 Type 2 diabetes mellitus with diabetic neuropathy, unspecified: Secondary | ICD-10-CM | POA: Diagnosis not present

## 2016-10-11 ENCOUNTER — Ambulatory Visit: Payer: Self-pay | Admitting: Family Medicine

## 2016-10-25 ENCOUNTER — Ambulatory Visit (INDEPENDENT_AMBULATORY_CARE_PROVIDER_SITE_OTHER): Payer: PPO

## 2016-10-25 VITALS — BP 180/78 | HR 80 | Temp 97.8°F | Resp 16 | Ht 67.0 in | Wt 179.6 lb

## 2016-10-25 DIAGNOSIS — Z0000 Encounter for general adult medical examination without abnormal findings: Secondary | ICD-10-CM

## 2016-10-25 NOTE — Patient Instructions (Addendum)
Todd [name] , Thank you for taking time to come for your Medicare Wellness Visit. I appreciate your ongoing commitment to your health goals. Please review the following plan we discussed and let me know if I can assist you in the future.   Screening recommendations/referrals: Colonoscopy: Completed 06/06/2003, no longer required Recommended yearly ophthalmology/optometry visit for glaucoma screening and checkup Recommended yearly dental visit for hygiene and checkup  Vaccinations: Influenza vaccine: up to date, due 03/2017 Pneumococcal vaccine:  Completed series Tdap vaccine: check with your insurance company for coverage. Shingles vaccine: up to date  Advanced directives: Please bring a copy of your advanced directives at your convenience.   Conditions/risks identified: Recommend to continue drinking 6-8 glasses of water a day.   Next appointment: Follow up with [name] on 11/08/2016 at 11:00am. Follow up in one year for your annual wellness exam.   Preventive Care 65 Years and Older, Male Preventive care refers to lifestyle choices and visits with your health care provider that can promote health and wellness. What does preventive care include?  A yearly physical exam. This is also called an annual well check.  Dental exams once or twice a year.  Routine eye exams. Ask your health care provider how often you should have your eyes checked.  Personal lifestyle choices, including:  Daily care of your teeth and gums.  Regular physical activity.  Eating a healthy diet.  Avoiding tobacco and drug use.  Limiting alcohol use.  Practicing safe sex.  Taking low doses of aspirin every day.  Taking vitamin and mineral supplements as recommended by your health care provider. What happens during an annual well check? The services and screenings done by your health care provider during your annual well check will depend on your age, overall health, lifestyle risk factors, and family  history of disease. Counseling  Your health care provider may ask you questions about your:  Alcohol use.  Tobacco use.  Drug use.  Emotional well-being.  Home and relationship well-being.  Sexual activity.  Eating habits.  History of falls.  Memory and ability to understand (cognition).  Work and work Statistician. Screening  You may have the following tests or measurements:  Height, weight, and BMI.  Blood pressure.  Lipid and cholesterol levels. These may be checked every 5 years, or more frequently if you are over 53 years old.  Skin check.  Lung cancer screening. You may have this screening every year starting at age 24 if you have a 30-pack-year history of smoking and currently smoke or have quit within the past 15 years.  Fecal occult blood test (FOBT) of the stool. You may have this test every year starting at age 20.  Flexible sigmoidoscopy or colonoscopy. You may have a sigmoidoscopy every 5 years or a colonoscopy every 10 years starting at age 41.  Prostate cancer screening. Recommendations will vary depending on your family history and other risks.  Hepatitis C blood test.  Hepatitis B blood test.  Sexually transmitted disease (STD) testing.  Diabetes screening. This is done by checking your blood sugar (glucose) after you have not eaten for a while (fasting). You may have this done every 1-3 years.  Abdominal aortic aneurysm (AAA) screening. You may need this if you are a current or former smoker.  Osteoporosis. You may be screened starting at age 11 if you are at high risk. Talk with your health care provider about your test results, treatment options, and if necessary, the need for more tests.  Vaccines  Your health care provider may recommend certain vaccines, such as:  Influenza vaccine. This is recommended every year.  Tetanus, diphtheria, and acellular pertussis (Tdap, Td) vaccine. You may need a Td booster every 10 years.  Zoster vaccine.  You may need this after age 22.  Pneumococcal 13-valent conjugate (PCV13) vaccine. One dose is recommended after age 3.  Pneumococcal polysaccharide (PPSV23) vaccine. One dose is recommended after age 80. Talk to your health care provider about which screenings and vaccines you need and how often you need them. This information is not intended to replace advice given to you by your health care provider. Make sure you discuss any questions you have with your health care provider. Document Released: 06/18/2015 Document Revised: 02/09/2016 Document Reviewed: 03/23/2015 Elsevier Interactive Patient Education  2017 [place] Prevention in the Home Falls can cause injuries. They can happen to people of all ages. There are many things you can do to make your home safe and to help prevent falls. What can I do on the outside of my home?  Regularly fix the edges of walkways and driveways and fix any cracks.  Remove anything that might make you trip as you walk through a door, such as a raised step or threshold.  Trim any bushes or trees on the path to your home.  Use bright outdoor lighting.  Clear any walking paths of anything that might make someone trip, such as rocks or tools.  Regularly check to see if handrails are loose or broken. Make sure that both sides of any steps have handrails.  Any raised decks and porches should have guardrails on the edges.  Have any leaves, snow, or ice cleared regularly.  Use sand or salt on walking paths during winter.  Clean up any spills in your garage right away. This includes oil or grease spills. What can I do in the bathroom?  Use night lights.  Install grab bars by the toilet and in the tub and shower. Do not use towel bars as grab bars.  Use non-skid mats or decals in the tub or shower.  If you need to sit down in the shower, use a plastic, non-slip stool.  Keep the floor dry. Clean up any water that spills on the floor as soon  as it happens.  Remove soap buildup in the tub or shower regularly.  Attach bath mats securely with double-sided non-slip rug tape.  Do not have throw rugs and other things on the floor that can make you trip. What can I do in the bedroom?  Use night lights.  Make sure that you have a light by your bed that is easy to reach.  Do not use any sheets or blankets that are too big for your bed. They should not hang down onto the floor.  Have a firm chair that has side arms. You can use this for support while you get dressed.  Do not have throw rugs and other things on the floor that can make you trip. What can I do in the kitchen?  Clean up any spills right away.  Avoid walking on wet floors.  Keep items that you use a lot in easy-to-reach places.  If you need to reach something above you, use a strong step stool that has a grab bar.  Keep electrical cords out of the way.  Do not use floor polish or wax that makes floors slippery. If you must use wax, use non-skid floor wax.  Do not have throw rugs and other things on the floor that can make you trip. What can I do with my stairs?  Do not leave any items on the stairs.  Make sure that there are handrails on both sides of the stairs and use them. Fix handrails that are broken or loose. Make sure that handrails are as long as the stairways.  Check any carpeting to make sure that it is firmly attached to the stairs. Fix any carpet that is loose or worn.  Avoid having throw rugs at the top or bottom of the stairs. If you do have throw rugs, attach them to the floor with carpet tape.  Make sure that you have a light switch at the top of the stairs and the bottom of the stairs. If you do not have them, ask someone to add them for you. What else can I do to help prevent falls?  Wear shoes that:  Do not have high heels.  Have rubber bottoms.  Are comfortable and fit you well.  Are closed at the toe. Do not wear sandals.  If  you use a stepladder:  Make sure that it is fully opened. Do not climb a closed stepladder.  Make sure that both sides of the stepladder are locked into place.  Ask someone to hold it for you, if possible.  Clearly mark and make sure that you can see:  Any grab bars or handrails.  First and last steps.  Where the edge of each step is.  Use tools that help you move around (mobility aids) if they are needed. These include:  Canes.  Walkers.  Scooters.  Crutches.  Turn on the lights when you go into a dark area. Replace any light bulbs as soon as they burn out.  Set up your furniture so you have a clear path. Avoid moving your furniture around.  If any of your floors are uneven, fix them.  If there are any pets around you, be aware of where they are.  Review your medicines with your doctor. Some medicines can make you feel dizzy. This can increase your chance of falling. Ask your doctor what other things that you can do to help prevent falls. This information is not intended to replace advice given to you by your health care provider. Make sure you discuss any questions you have with your health care provider. Document Released: 03/18/2009 Document Revised: 10/28/2015 Document Reviewed: 06/26/2014 Elsevier Interactive Patient Education  2017 [employer].

## 2016-10-25 NOTE — Progress Notes (Signed)
Subjective:   Todd [name] is a 83 y.o. male who presents for Medicare Annual/Subsequent preventive examination.  Review of Systems:   Cardiac Risk Factors include: male gender;advanced age (>79men, >75 women);hypertension;diabetes mellitus     Objective:    Vitals: BP (!) 180/78 (BP Location: Left Arm, Cuff Size: Normal) Comment: recheck  Pulse 80   Temp 97.8 F (36.6 C)   Resp 16   Ht 5\' 7"  (1.702 m)   Wt 179 lb 9.6 oz (81.5 kg)   BMI 28.13 kg/m   Body mass index is 28.13 kg/m.  Tobacco History  Smoking Status  . Former Smoker  . Quit date: 12/02/1994  Smokeless Tobacco  . Never Used     Counseling given: Not Answered   Past Medical History:  Diagnosis Date  . Accelerated hypertension 03/04/2015  . Anxiety   . Aortic regurgitation   . Arthritis    osteoarthritis-left knee  . CAD (coronary artery disease)   . Depression   . Diabetes mellitus without complication ([place])   . ED (erectile dysfunction)   . GERD (gastroesophageal reflux disease)   . Glaucoma   . Hyperlipidemia   . Hypertension   . Iron deficiency anemia due to chronic blood loss   . Left heart failure ([place])   . Lumbago    Lumbosacral Neuritis  . Prostate cancer [facility])    Past Surgical History:  Procedure Laterality Date  . CARPAL TUNNEL RELEASE    . REPLACEMENT TOTAL KNEE BILATERAL     Family History  Problem Relation Age of Onset  . Stroke Mother   . Hypertension Mother   . Hypertension Father   . Stroke Sister   . Prostate cancer Neg Hx    History  Sexual Activity  . Sexual activity: No    Outpatient Encounter Prescriptions as of 10/25/2016  Medication Sig  . aspirin EC 81 MG tablet Take 81 mg by mouth daily.  . busPIRone (BUSPAR) 10 MG tablet Take 10 mg by mouth daily.   . clonazePAM (KLONOPIN) 0.5 MG tablet Take 1 tablet (0.5 mg total) by mouth daily as needed for anxiety.  . DULoxetine (CYMBALTA) 20 MG capsule Take 1 capsule (20 mg total) by mouth daily.  [name] Kitchen gabapentin  (NEURONTIN) 100 MG capsule Take 100 mg by mouth 3 (three) times daily.   [name] Kitchen latanoprost (XALATAN) 0.005 % ophthalmic solution   . metFORMIN (GLUCOPHAGE) 500 MG tablet Take 1 tablet (500 mg total) by mouth 2 (two) times daily with a meal.  . propranolol ER (INDERAL LA) 80 MG 24 hr capsule Take 80 mg by mouth daily.  [name] Kitchen telmisartan (MICARDIS) 80 MG tablet Take 1 tablet (80 mg total) by mouth daily.  . [DISCONTINUED] celecoxib (CELEBREX) 200 MG capsule Take 200 mg by mouth daily.   . [DISCONTINUED] travoprost, benzalkonium, (TRAVATAN) 0.004 % ophthalmic solution Apply to eye.   No facility-administered encounter medications on file as of 10/25/2016.     Activities of Daily Living In your present state of health, do you have any difficulty performing the following activities: 10/25/2016  Hearing? N  Vision? N  Difficulty concentrating or making decisions? N  Walking or climbing stairs? N  Dressing or bathing? Y  Doing errands, shopping? N  Preparing Food and eating ? N  Using the Toilet? N  In the past six months, have you accidently leaked urine? N  Do you have problems with loss of bowel control? N  Managing your Medications? N  Managing your Finances?  N  Housekeeping or managing your Housekeeping? N  Some recent data might be hidden    Patient Care Team: Guadalupe Maple, MD as PCP - General (Family Medicine) Collier Flowers, MD as Referring Physician (Urology) Birder Robson, MD as Referring Physician (Ophthalmology) Reche Dixon, PA-C (Orthopedic Surgery) Vladimir Crofts, MD (Neurology)   Assessment:     Exercise Activities and Dietary recommendations Current Exercise Habits: The patient does not participate in regular exercise at present, Exercise limited by: orthopedic condition(s)  Goals    . Increase water intake          Recommend to continue drinking 6-8 glasses of water a day.       Fall Risk Fall Risk  10/25/2016 07/10/2016 10/11/2015  Falls in the past year? No No  Yes  Number falls in past yr: - - 1  Injury with Fall? - - Yes   Depression Screen PHQ 2/9 Scores 10/25/2016 09/27/2016 10/11/2015  PHQ - 2 Score 1 2 2   PHQ- 9 Score 6 9 11     Cognitive Function     6CIT Screen 10/25/2016  What Year? 0 points  What month? 0 points  What time? 3 points  Count back from 20 0 points  Months in reverse 4 points  Repeat phrase 10 points  Total Score 17    Immunization History  Administered Date(s) Administered  . Influenza, High Dose Seasonal PF 03/15/2016  . Influenza,inj,Quad PF,36+ Mos 03/05/2015, 04/01/2015  . Influenza-Unspecified 03/17/2014  . Pneumococcal Conjugate-13 03/17/2014  . Pneumococcal Polysaccharide-23 03/05/2001  . Td 10/22/2003  . Zoster 07/12/2009   Screening Tests Health Maintenance  Topic Date Due  . FOOT EXAM  11/07/2016 (Originally 05/25/2016)  . TETANUS/TDAP  06/05/2017 (Originally 10/21/2013)  . OPHTHALMOLOGY EXAM  12/07/2016  . INFLUENZA VACCINE  01/03/2017  . HEMOGLOBIN A1C  04/03/2017  . PNA vac Low Risk Adult  Completed      Plan:    I have personally reviewed and addressed the Medicare Annual Wellness questionnaire and have noted the following in the patient's chart:  A. Medical and social history B. Use of alcohol, tobacco or illicit drugs  C. Current medications and supplements D. Functional ability and status E.  Nutritional status F.  Physical activity G. Advance directives H. List of other physicians I.  Hospitalizations, surgeries, and ER visits in previous 12 months J.  [place] such as hearing and vision if needed, cognitive and depression L. Referrals and appointments - 11/08/2016 at 11:00am with [name]  In addition, I have reviewed and discussed with patient certain preventive protocols, quality metrics, and best practice recommendations. A written personalized care plan for preventive services as well as general preventive health recommendations were provided to  patient.   Signed,  Tyler Aas, LPN Nurse Health Advisor   MD Recommendations: Needs diabetic foot exam.  As discussed with you face to face- Cognitive exam was 17- patient has seen Dr. Manuella Ghazi for Dementia withi the last 6 months. Patients BP elevated at today's visit, rechecked before discharge with better results.    I as supervising physician, have reviewed the nurse health advisors Medicare wellness visit note for this patient and concur with the findings and recommendations listed above.

## 2016-10-27 ENCOUNTER — Ambulatory Visit: Payer: Self-pay | Admitting: Family Medicine

## 2016-11-01 ENCOUNTER — Ambulatory Visit (INDEPENDENT_AMBULATORY_CARE_PROVIDER_SITE_OTHER): Payer: PPO

## 2016-11-01 ENCOUNTER — Telehealth: Payer: Self-pay | Admitting: Family Medicine

## 2016-11-01 ENCOUNTER — Ambulatory Visit
Admission: EM | Admit: 2016-11-01 | Discharge: 2016-11-01 | Disposition: A | Discharge status: Home / Self Care | Payer: PPO | Attending: Family Medicine | Admitting: Family Medicine

## 2016-11-01 DIAGNOSIS — Z8249 Family history of ischemic heart disease and other diseases of the circulatory system: Secondary | ICD-10-CM | POA: Diagnosis not present

## 2016-11-01 DIAGNOSIS — F411 Generalized anxiety disorder: Secondary | ICD-10-CM | POA: Insufficient documentation

## 2016-11-01 DIAGNOSIS — Z7984 Long term (current) use of oral hypoglycemic drugs: Secondary | ICD-10-CM | POA: Diagnosis not present

## 2016-11-01 DIAGNOSIS — H409 Unspecified glaucoma: Secondary | ICD-10-CM | POA: Insufficient documentation

## 2016-11-01 DIAGNOSIS — Z823 Family history of stroke: Secondary | ICD-10-CM | POA: Insufficient documentation

## 2016-11-01 DIAGNOSIS — Z87891 Personal history of nicotine dependence: Secondary | ICD-10-CM | POA: Insufficient documentation

## 2016-11-01 DIAGNOSIS — Z8546 Personal history of malignant neoplasm of prostate: Secondary | ICD-10-CM | POA: Diagnosis not present

## 2016-11-01 DIAGNOSIS — F329 Major depressive disorder, single episode, unspecified: Secondary | ICD-10-CM | POA: Insufficient documentation

## 2016-11-01 DIAGNOSIS — G250 Essential tremor: Secondary | ICD-10-CM | POA: Insufficient documentation

## 2016-11-01 DIAGNOSIS — Z96653 Presence of artificial knee joint, bilateral: Secondary | ICD-10-CM | POA: Insufficient documentation

## 2016-11-01 DIAGNOSIS — R5383 Other fatigue: Secondary | ICD-10-CM

## 2016-11-01 DIAGNOSIS — I10 Essential (primary) hypertension: Secondary | ICD-10-CM | POA: Diagnosis not present

## 2016-11-01 DIAGNOSIS — I517 Cardiomegaly: Secondary | ICD-10-CM | POA: Diagnosis not present

## 2016-11-01 DIAGNOSIS — Z79899 Other long term (current) drug therapy: Secondary | ICD-10-CM | POA: Diagnosis not present

## 2016-11-01 DIAGNOSIS — Z7982 Long term (current) use of aspirin: Secondary | ICD-10-CM | POA: Insufficient documentation

## 2016-11-01 DIAGNOSIS — N529 Male erectile dysfunction, unspecified: Secondary | ICD-10-CM | POA: Insufficient documentation

## 2016-11-01 DIAGNOSIS — K219 Gastro-esophageal reflux disease without esophagitis: Secondary | ICD-10-CM | POA: Diagnosis not present

## 2016-11-01 DIAGNOSIS — E785 Hyperlipidemia, unspecified: Secondary | ICD-10-CM | POA: Diagnosis not present

## 2016-11-01 DIAGNOSIS — E119 Type 2 diabetes mellitus without complications: Secondary | ICD-10-CM | POA: Diagnosis not present

## 2016-11-01 LAB — CBC WITH DIFFERENTIAL/PLATELET
Basophils Absolute: 0.1 10*3/uL (ref 0–0.1)
Basophils Relative: 1 %
Eosinophils Absolute: 0.1 10*3/uL (ref 0–0.7)
Eosinophils Relative: 3 %
HCT: 35 % — ABNORMAL LOW (ref 40.0–52.0)
Hemoglobin: 11.4 g/dL — ABNORMAL LOW (ref 13.0–18.0)
Lymphocytes Relative: 26 %
Lymphs Abs: 1.3 10*3/uL (ref 1.0–3.6)
MCH: 30.9 pg (ref 26.0–34.0)
MCHC: 32.6 g/dL (ref 32.0–36.0)
MCV: 94.5 fL (ref 80.0–100.0)
Monocytes Absolute: 0.6 10*3/uL (ref 0.2–1.0)
Monocytes Relative: 13 %
Neutro Abs: 2.9 10*3/uL (ref 1.4–6.5)
Neutrophils Relative %: 57 %
Platelets: 200 10*3/uL (ref 150–440)
RBC: 3.7 MIL/uL — ABNORMAL LOW (ref 4.40–5.90)
RDW: 13.9 % (ref 11.5–14.5)
WBC: 5 10*3/uL (ref 3.8–10.6)

## 2016-11-01 LAB — URINALYSIS, COMPLETE (UACMP) WITH MICROSCOPIC
Bilirubin Urine: NEGATIVE
Glucose, UA: NEGATIVE mg/dL
Hgb urine dipstick: NEGATIVE
Ketones, ur: NEGATIVE mg/dL
Leukocytes, UA: NEGATIVE
Nitrite: NEGATIVE
Protein, ur: NEGATIVE mg/dL
Specific Gravity, Urine: 1.025 (ref 1.005–1.030)
pH: 5.5 (ref 5.0–8.0)

## 2016-11-01 LAB — COMPREHENSIVE METABOLIC PANEL
ALT: 8 U/L — ABNORMAL LOW (ref 17–63)
AST: 16 U/L (ref 15–41)
Albumin: 4.3 g/dL (ref 3.5–5.0)
Alkaline Phosphatase: 71 U/L (ref 38–126)
Anion gap: 7 (ref 5–15)
BUN: 22 mg/dL — ABNORMAL HIGH (ref 6–20)
CO2: 27 mmol/L (ref 22–32)
Calcium: 9 mg/dL (ref 8.9–10.3)
Chloride: 105 mmol/L (ref 101–111)
Creatinine, Ser: 1 mg/dL (ref 0.61–1.24)
GFR calc Af Amer: >60 mL/min (ref >60)
GFR calc non Af Amer: >60 mL/min (ref >60)
Glucose, Bld: 111 mg/dL — ABNORMAL HIGH (ref 65–99)
Potassium: 4.5 mmol/L (ref 3.5–5.1)
Sodium: 139 mmol/L (ref 135–145)
Total Bilirubin: 0.8 mg/dL (ref 0.3–1.2)
Total Protein: 7.4 g/dL (ref 6.5–8.1)

## 2016-11-01 LAB — GLUCOSE, CAPILLARY: Glucose-Capillary: 97 mg/dL (ref 65–99)

## 2016-11-01 NOTE — Discharge Instructions (Signed)
Increase fluids/water intake Follow up with Primary Care Provider

## 2016-11-01 NOTE — Telephone Encounter (Signed)
Attempted to reach no answer

## 2016-11-01 NOTE — Telephone Encounter (Signed)
Patient's daughter would like to speak with PCP regarding a decrease in gabapentin dosage. Patient also dropped her phone in water and asked if PCP could leave a message on her home number: [phone].

## 2016-11-01 NOTE — Telephone Encounter (Signed)
From OV on 10/02/16,   " Anxiety, generalized     Expressive and generalized slowness possibly aggravated by gabapentin. Gave written directions about decreasing gabapentin from 1 pill morning And 3 at bedtime to 1 pill morning and noon and 2 at bedtime for 3 days then 1 pill morning and noon and night. During this time will observe tremor and expressive slowness to see if any changes accomplished.    "

## 2016-11-01 NOTE — ED Triage Notes (Signed)
Patient complains of fatigue and tiredness that started on Monday. Patient states that he just doesn't feel right.

## 2016-11-01 NOTE — Telephone Encounter (Signed)
Call daughter

## 2016-11-01 NOTE — Telephone Encounter (Signed)
Pts daughter would like to speak with PCP about gabapentin dosage.

## 2016-11-01 NOTE — ED Provider Notes (Addendum)
[facility]    CSN: [id number] Arrival date & time: 11/01/16  1738     History   Chief Complaint Chief Complaint  Patient presents with  . Fatigue    HPI Todd [name] is a 83 y.o. male.   The history is provided by the patient and a relative.  Weakness  Primary symptoms include no focal weakness, no loss of sensation, no speech change. Primary symptoms comment: general weakness; fatigue. This is a new problem. The current episode started 2 days ago. The problem has not changed since onset.There was no focality noted. There has been no fever. Pertinent negatives include no shortness of breath, no chest pain, no vomiting, no altered mental status, no confusion and no headaches. Associated symptoms comments: Patient denies any fevers, chills, cough, dysuria, numbness/tingling, one-sided weakness, abdominal pain, constipation, diarrhea, nausea/vomiting. Associated medical issues include dementia.    Past Medical History:  Diagnosis Date  . Accelerated hypertension 03/04/2015  . Anxiety   . Aortic regurgitation   . Arthritis    osteoarthritis-left knee  . CAD (coronary artery disease)   . Depression   . Diabetes mellitus without complication ([place])   . ED (erectile dysfunction)   . GERD (gastroesophageal reflux disease)   . Glaucoma   . Hyperlipidemia   . Hypertension   . Iron deficiency anemia due to chronic blood loss   . Left heart failure ([place])   . Lumbago    Lumbosacral Neuritis  . Prostate cancer [facility])     Patient Active Problem List   Diagnosis Date Noted  . 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 Urinary incontinence 04/27/2015  . Anxiety, generalized 04/07/2015  . History of BCG vaccination 04/07/2015  . GERD (gastroesophageal reflux disease) 03/04/2015  . Anxiety 03/04/2015  . Fatigue 03/01/2015  . Depression 02/25/2015  . Degeneration of intervertebral disc of lumbar region 02/23/2015  . Neuritis or radiculitis due to rupture of lumbar  intervertebral disc 02/23/2015  . Lumbar canal stenosis 02/23/2015  . Hypertension 12/02/2014  . Diabetes mellitus without complication ([place]) 71/11/2692  . Benign essential tremor 03/13/2014  . Imbalance 03/13/2014  . CA of prostate ([place]) 10/07/2012  . Malignant neoplasm of prostate ([place]) 10/07/2012  . ED (erectile dysfunction) of organic origin 02/09/2012    Past Surgical History:  Procedure Laterality Date  . CARPAL TUNNEL RELEASE    . REPLACEMENT TOTAL KNEE BILATERAL         Home Medications    Prior to Admission medications   Medication Sig Start Date End Date Taking? Authorizing Provider  aspirin EC 81 MG tablet Take 81 mg by mouth daily.   Yes [provider]  busPIRone (BUSPAR) 10 MG tablet Take 10 mg by mouth daily.  07/21/16 07/21/17 Yes [provider]  clonazePAM (KLONOPIN) 0.5 MG tablet Take 1 tablet (0.5 mg total) by mouth daily as needed for anxiety. 09/27/16  Yes Volney American, PA-C  DULoxetine (CYMBALTA) 20 MG capsule Take 1 capsule (20 mg total) by mouth daily. 03/15/16  Yes Crissman, Jeannette How, MD  gabapentin (NEURONTIN) 100 MG capsule Take 100 mg by mouth 3 (three) times daily.    Yes [provider]  latanoprost (XALATAN) 0.005 % ophthalmic solution  06/08/16  Yes [provider]  metFORMIN (GLUCOPHAGE) 500 MG tablet Take 1 tablet (500 mg total) by mouth 2 (two) times daily with a meal. 03/15/16  Yes Crissman, Jeannette How, MD  propranolol ER (INDERAL LA) 80 MG 24 hr capsule Take  80 mg by mouth daily. 05/15/16  Yes [provider]  telmisartan (MICARDIS) 80 MG tablet Take 1 tablet (80 mg total) by mouth daily. 09/15/16  Yes Valerie Roys, DO    Family History Family History  Problem Relation Age of Onset  . Stroke Mother   . Hypertension Mother   . Hypertension Father   . Stroke Sister   . Prostate cancer Neg Hx     Social History Social History  Substance Use Topics  . Smoking status: Former Smoker    Quit  date: 12/02/1994  . Smokeless tobacco: Never Used  . Alcohol use Not on file     Allergies   Fluoxetine and Meloxicam   Review of Systems Review of Systems  Respiratory: Negative for shortness of breath.   Cardiovascular: Negative for chest pain.  Gastrointestinal: Negative for vomiting.  Neurological: Positive for weakness. Negative for speech change, focal weakness and headaches.  Psychiatric/Behavioral: Negative for confusion.     Physical Exam Triage Vital Signs ED Triage Vitals  Enc Vitals Group     BP 11/01/16 1753 (!) 171/111     Pulse Rate 11/01/16 1753 64     Resp 11/01/16 1753 16     Temp 11/01/16 1753 97.7 F (36.5 C)     Temp Source 11/01/16 1753 Oral     SpO2 11/01/16 1753 100 %     Weight 11/01/16 1752 179 lb (81.2 kg)     Height 11/01/16 1752 5\' 7"  (1.702 m)     Head Circumference --      Peak Flow --      Pain Score 11/01/16 1752 7     Pain Loc --      Pain Edu? --      Excl. in [place]? --    No data found.   Updated Vital Signs BP (!) 171/111 (BP Location: Left Arm)   Pulse 64   Temp 97.7 F (36.5 C) (Oral)   Resp 16   Ht 5\' 7"  (1.702 m)   Wt 179 lb (81.2 kg)   SpO2 100%   BMI 28.04 kg/m   Visual Acuity Right Eye Distance:   Left Eye Distance:   Bilateral Distance:    Right Eye Near:   Left Eye Near:    Bilateral Near:     Physical Exam  Constitutional: He is oriented to person, place, and time. He appears well-developed and well-nourished. No distress.  HENT:  Head: Normocephalic and atraumatic.  Nose: Nose normal.  Mouth/Throat: Uvula is midline, oropharynx is clear and moist and mucous membranes are normal. No oropharyngeal exudate or tonsillar abscesses.  Eyes: Conjunctivae and EOM are normal. Pupils are equal, round, and reactive to light. Right eye exhibits no discharge. Left eye exhibits no discharge. No scleral icterus.  Neck: Normal range of motion. Neck supple. No tracheal deviation present. No thyromegaly present.    Cardiovascular: Normal rate, regular rhythm and normal heart sounds.   Pulmonary/Chest: Effort normal and breath sounds normal. No stridor. No respiratory distress. He has no wheezes. He has no rales. He exhibits no tenderness.  Abdominal: Soft. Bowel sounds are normal. He exhibits no distension and no mass. There is no tenderness. There is no rebound and no guarding. No hernia.  Lymphadenopathy:    He has no cervical adenopathy.  Neurological: He is alert and oriented to person, place, and time. He displays normal reflexes. No cranial nerve deficit or sensory deficit. He exhibits normal muscle tone. Coordination normal.  Skin: Skin is warm and dry. No rash noted. He is not diaphoretic. No erythema.  Psychiatric: His affect is blunt. He exhibits a depressed mood.  Nursing note and vitals reviewed.    UC Treatments / Results  Labs (all labs ordered are listed, but only abnormal results are displayed) Labs Reviewed  CBC WITH DIFFERENTIAL/PLATELET - Abnormal; Notable for the following:       Result Value   RBC 3.70 (*)    Hemoglobin 11.4 (*)    HCT 35.0 (*)    All other components within normal limits  COMPREHENSIVE METABOLIC PANEL - Abnormal; Notable for the following:    Glucose, Bld 111 (*)    BUN 22 (*)    ALT 8 (*)    All other components within normal limits  URINALYSIS, COMPLETE (UACMP) WITH MICROSCOPIC - Abnormal; Notable for the following:    Squamous Epithelial / LPF 0-5 (*)    Bacteria, UA RARE (*)    All other components within normal limits  URINE CULTURE  GLUCOSE, CAPILLARY  CBG MONITORING, ED    EKG  EKG Interpretation None       Radiology Dg Chest 2 View  Result Date: 11/01/2016 CLINICAL DATA:  Malaise. Fatigue and loss of energy. This began 2 days ago. EXAM: CHEST  2 VIEW COMPARISON:  03/04/2015. FINDINGS: The heart is enlarged. Aorta is on coronal. Chronic elevation RIGHT hemidiaphragm. No active infiltrates or failure. No effusion or pneumothorax.  Thoracic and lumbar spondylosis. Coronary artery calcification. IMPRESSION: Cardiomegaly.  No active disease.  Stable appearance from priors. Electronically Signed   By: Staci Righter M.D.   On: 11/01/2016 19:46    Procedures .EKG Date/Time: 11/01/2016 8:46 PM Performed by: Norval Gable Authorized by: Norval Gable   ECG reviewed by ED Physician in the absence of a cardiologist: yes   Previous ECG:    Previous ECG:  Compared to current   Similarity:  No change Interpretation:    Interpretation: normal   Rate:    ECG rate assessment: normal   Rhythm:    Rhythm: A-V block     Rhythm comment:  1st degree Ectopy:    Ectopy: PVCs     PVCs:  Infrequent QRS:    QRS axis:  Normal Conduction:    Conduction: normal   ST segments:    ST segments:  Normal T waves:    T waves: normal      (including critical care time)  Medications Ordered in UC Medications - No data to display   Initial Impression / Assessment and Plan / UC Course  I have reviewed the triage vital signs and the nursing notes.  Pertinent labs & imaging results that were available during my care of the patient were reviewed by me and considered in my medical decision making (see chart for details).       Final Clinical Impressions(s) / UC Diagnoses   Final diagnoses:  Fatigue, unspecified type    New Prescriptions Discharge Medication List as of 11/01/2016  7:53 PM     1. Labs/x-ray/ekg results (negative/normal/unchanged) and diagnosis reviewed with patient and daughter  2. Recommend supportive treatment with increased fluids 4. Follow-up with PCP  5. Follow up prn if symptoms worsen or don't improve   Norval Gable, MD 11/01/16 2952    Norval Gable, MD 11/01/16 2052

## 2016-11-02 NOTE — Telephone Encounter (Signed)
Daughters number went straight to vm.

## 2016-11-03 LAB — URINE CULTURE
Culture: NO GROWTH
Special Requests: NORMAL

## 2016-11-03 NOTE — Telephone Encounter (Signed)
Message left on daughters home number, as well as attempted to reach on cell.

## 2016-11-03 NOTE — Telephone Encounter (Signed)
Daughter called and I informed her that [name] was able to speak to someone yesterday at the pt's home. Possibly  His daughter in law.

## 2016-11-03 NOTE — Telephone Encounter (Signed)
Patients daughter would like to speak to [facility] regarding some medications the patient is taking.  She has been having problems with her phone due to the fact it was dropped in water.  Thanks

## 2016-11-06 ENCOUNTER — Telehealth: Payer: Self-pay | Admitting: Family Medicine

## 2016-11-07 NOTE — Telephone Encounter (Signed)
Patient's daughter called in regards to providers phone call with patient's daughter in law. Patient's daughter would like for either CMA or provider to call her back about what was discussed. Informed patient provider was out of the office today. Patient understood.   Please Advise.  Thank you

## 2016-11-08 ENCOUNTER — Encounter: Payer: Self-pay | Admitting: Family Medicine

## 2016-11-08 ENCOUNTER — Ambulatory Visit: Payer: Self-pay | Admitting: Family Medicine

## 2016-11-08 NOTE — Telephone Encounter (Signed)
Left message on machine for pt to return call to the office.  

## 2016-11-09 NOTE — Telephone Encounter (Signed)
Patient was transferred to provider for telephone conversation.   

## 2016-11-14 DIAGNOSIS — M48062 Spinal stenosis, lumbar region with neurogenic claudication: Secondary | ICD-10-CM | POA: Diagnosis not present

## 2016-11-14 DIAGNOSIS — M5136 Other intervertebral disc degeneration, lumbar region: Secondary | ICD-10-CM | POA: Diagnosis not present

## 2016-11-14 DIAGNOSIS — M5416 Radiculopathy, lumbar region: Secondary | ICD-10-CM | POA: Diagnosis not present

## 2016-12-05 ENCOUNTER — Encounter: Payer: Self-pay | Admitting: Family Medicine

## 2016-12-05 ENCOUNTER — Ambulatory Visit (INDEPENDENT_AMBULATORY_CARE_PROVIDER_SITE_OTHER): Payer: PPO | Admitting: Family Medicine

## 2016-12-05 ENCOUNTER — Telehealth: Payer: Self-pay | Admitting: Family Medicine

## 2016-12-05 DIAGNOSIS — Z1329 Encounter for screening for other suspected endocrine disorder: Secondary | ICD-10-CM

## 2016-12-05 DIAGNOSIS — E119 Type 2 diabetes mellitus without complications: Secondary | ICD-10-CM

## 2016-12-05 DIAGNOSIS — F411 Generalized anxiety disorder: Secondary | ICD-10-CM

## 2016-12-05 DIAGNOSIS — I10 Essential (primary) hypertension: Secondary | ICD-10-CM | POA: Diagnosis not present

## 2016-12-05 DIAGNOSIS — Z13220 Encounter for screening for lipoid disorders: Secondary | ICD-10-CM

## 2016-12-05 DIAGNOSIS — Z0000 Encounter for general adult medical examination without abnormal findings: Secondary | ICD-10-CM | POA: Diagnosis not present

## 2016-12-05 LAB — MICROSCOPIC EXAMINATION: Bacteria, UA: NONE SEEN

## 2016-12-05 LAB — URINALYSIS, ROUTINE W REFLEX MICROSCOPIC
Bilirubin, UA: NEGATIVE
Glucose, UA: NEGATIVE
Ketones, UA: NEGATIVE
Leukocytes, UA: NEGATIVE
Nitrite, UA: NEGATIVE
Protein, UA: NEGATIVE
RBC, UA: NEGATIVE
Specific Gravity, UA: 1.025 (ref 1.005–1.030)
Urobilinogen, Ur: 0.2 mg/dL (ref 0.2–1.0)
pH, UA: 5.5 (ref 5.0–7.5)

## 2016-12-05 MED ORDER — TELMISARTAN 80 MG PO TABS: 80.0000 mg | ORAL_TABLET | Freq: Every day | ORAL | 4 refills | Status: DC

## 2016-12-05 MED ORDER — DULOXETINE HCL 20 MG PO CPEP: 20.0000 mg | ORAL_CAPSULE | Freq: Every day | ORAL | 4 refills | Status: DC

## 2016-12-05 MED ORDER — METFORMIN HCL 500 MG PO TABS: 500.0000 mg | ORAL_TABLET | Freq: Two times a day (BID) | ORAL | 4 refills | Status: DC

## 2016-12-05 MED ORDER — PROPRANOLOL HCL ER 80 MG PO CP24: 80.0000 mg | ORAL_CAPSULE | Freq: Every day | ORAL | 4 refills | Status: DC

## 2016-12-05 MED ORDER — CLONAZEPAM 0.5 MG PO TABS: 0.5000 mg | ORAL_TABLET | Freq: Every day | ORAL | 5 refills | Status: DC | PRN

## 2016-12-05 NOTE — Assessment & Plan Note (Signed)
The current medical regimen is effective;  continue present plan and medications.  

## 2016-12-05 NOTE — Progress Notes (Signed)
BP (!) 158/70 (BP Location: Left Arm)   Pulse 63   Wt 178 lb (80.7 kg)   SpO2 99%   BMI 27.88 kg/m    Subjective:    Patient ID: Todd [name], male    DOB: Feb 20, 1931, 83 y.o.   MRN: [id number]  HPI: Todd [name] is a 83 y.o. male  Annual exam AWV metrics met  Patient accompanied by daughter-in-law who assists with history.   Relevant past medical, surgical, family and social history reviewed and updated as indicated. Interim medical history since our last visit reviewed. Allergies and medications reviewed and updated.  Review of Systems  Constitutional: Negative.   HENT: Negative.   Eyes: Negative.   Respiratory: Negative.   Cardiovascular: Negative.   Gastrointestinal: Negative.   Endocrine: Negative.   Genitourinary: Negative.   Musculoskeletal: Negative.        Uses a cane  Skin: Negative.   Allergic/Immunologic: Negative.   Neurological: Negative.   Hematological: Negative.   Psychiatric/Behavioral: Negative.     Per HPI unless specifically indicated above     Objective:    BP (!) 158/70 (BP Location: Left Arm)   Pulse 63   Wt 178 lb (80.7 kg)   SpO2 99%   BMI 27.88 kg/m   Wt Readings from Last 3 Encounters:  12/05/16 178 lb (80.7 kg)  11/01/16 179 lb (81.2 kg)  10/25/16 179 lb 9.6 oz (81.5 kg)    Physical Exam  Constitutional: He is oriented to person, place, and time. He appears well-developed and well-nourished.  HENT:  Head: Normocephalic.  Right Ear: External ear normal.  Left Ear: External ear normal.  Nose: Nose normal.  Eyes: Conjunctivae and EOM are normal. Pupils are equal, round, and reactive to light.  Neck: Normal range of motion. Neck supple. No thyromegaly present.  Cardiovascular: Normal rate, regular rhythm, normal heart sounds and intact distal pulses.   Pulmonary/Chest: Effort normal and breath sounds normal.  Abdominal: Soft. Bowel sounds are normal. There is no splenomegaly or hepatomegaly.  Musculoskeletal: Normal range  of motion. He exhibits no edema or tenderness.  Lymphadenopathy:    He has no cervical adenopathy.  Neurological: He is alert and oriented to person, place, and time. He has normal reflexes.  Skin: Skin is warm and dry.  Psychiatric: He has a normal mood and affect. His behavior is normal. Judgment and thought content normal.        Assessment & Plan:   Problem List Items Addressed This Visit      Cardiovascular and Mediastinum   Hypertension    The current medical regimen is effective;  continue present plan and medications.       Relevant Medications   telmisartan (MICARDIS) 80 MG tablet   propranolol ER (INDERAL LA) 80 MG 24 hr capsule   Other Relevant Orders   TSH   CBC with Differential/Platelet   Comprehensive metabolic panel     Endocrine   Diabetes mellitus without complication (HCC)    The current medical regimen is effective;  continue present plan and medications.       Relevant Medications   telmisartan (MICARDIS) 80 MG tablet   metFORMIN (GLUCOPHAGE) 500 MG tablet   Other Relevant Orders   TSH   Urinalysis, Routine w reflex microscopic   CBC with Differential/Platelet   Comprehensive metabolic panel     Other   Anxiety, generalized    Anxiety tremors seem to be well controlled with clonazepam 0.5 daily.  Relevant Medications   clonazePAM (KLONOPIN) 0.5 MG tablet   DULoxetine (CYMBALTA) 20 MG capsule   Other Relevant Orders   TSH    Other Visit Diagnoses    Annual physical exam       Screening cholesterol level       Relevant Orders   Lipid panel   Thyroid disorder screen       Relevant Orders   TSH       Follow up plan: Return in about 3 months (around 03/07/2017) for Hemoglobin A1c.

## 2016-12-05 NOTE — Assessment & Plan Note (Signed)
Anxiety tremors seem to be well controlled with clonazepam 0.5 daily.

## 2016-12-05 NOTE — Telephone Encounter (Signed)
Left message on machine for pt to return call to the office.  

## 2016-12-05 NOTE — Telephone Encounter (Signed)
Spoke with daughter, advised he is supposed to be Cymbalta 20 mg daily. Daughter thought he was taking 10 mg daily. Pharmacy advised her that they had been filling this dose since June of 2017, I verified that in our system. Daughter is going to take one of the old pills to the pharmacy for confirmation.

## 2016-12-06 LAB — CBC WITH DIFFERENTIAL/PLATELET
Basophils Absolute: 0.1 10*3/uL (ref 0.0–0.2)
Basos: 1 %
EOS (ABSOLUTE): 0.2 10*3/uL (ref 0.0–0.4)
Eos: 3 %
Hematocrit: 39.2 % (ref 37.5–51.0)
Hemoglobin: 12.3 g/dL — ABNORMAL LOW (ref 13.0–17.7)
Immature Grans (Abs): 0.1 10*3/uL (ref 0.0–0.1)
Immature Granulocytes: 1 %
Lymphocytes Absolute: 1.5 10*3/uL (ref 0.7–3.1)
Lymphs: 25 %
MCH: 29.6 pg (ref 26.6–33.0)
MCHC: 31.4 g/dL — ABNORMAL LOW (ref 31.5–35.7)
MCV: 95 fL (ref 79–97)
Monocytes Absolute: 0.6 10*3/uL (ref 0.1–0.9)
Monocytes: 11 %
Neutrophils Absolute: 3.5 10*3/uL (ref 1.4–7.0)
Neutrophils: 59 %
Platelets: 251 10*3/uL (ref 150–379)
RBC: 4.15 x10E6/uL (ref 4.14–5.80)
RDW: 13.5 % (ref 12.3–15.4)
WBC: 5.9 10*3/uL (ref 3.4–10.8)

## 2016-12-06 LAB — LIPID PANEL
Chol/HDL Ratio: 4 ratio (ref 0.0–5.0)
Cholesterol, Total: 190 mg/dL (ref 100–199)
HDL: 47 mg/dL (ref >39)
LDL Calculated: 121 mg/dL — ABNORMAL HIGH (ref 0–99)
Triglycerides: 108 mg/dL (ref 0–149)
VLDL Cholesterol Cal: 22 mg/dL (ref 5–40)

## 2016-12-06 LAB — COMPREHENSIVE METABOLIC PANEL
ALT: 6 IU/L (ref 0–44)
AST: 14 IU/L (ref 0–40)
Albumin/Globulin Ratio: 1.8 (ref 1.2–2.2)
Albumin: 4.6 g/dL (ref 3.5–4.7)
Alkaline Phosphatase: 86 IU/L (ref 39–117)
BUN/Creatinine Ratio: 17 (ref 10–24)
BUN: 21 mg/dL (ref 8–27)
Bilirubin Total: 0.6 mg/dL (ref 0.0–1.2)
CO2: 25 mmol/L (ref 20–29)
Calcium: 9.6 mg/dL (ref 8.6–10.2)
Chloride: 105 mmol/L (ref 96–106)
Creatinine, Ser: 1.26 mg/dL (ref 0.76–1.27)
GFR calc Af Amer: 60 mL/min/{1.73_m2} (ref >59)
GFR calc non Af Amer: 52 mL/min/{1.73_m2} — ABNORMAL LOW (ref >59)
Globulin, Total: 2.6 g/dL (ref 1.5–4.5)
Glucose: 141 mg/dL — ABNORMAL HIGH (ref 65–99)
Potassium: 4.9 mmol/L (ref 3.5–5.2)
Sodium: 144 mmol/L (ref 134–144)
Total Protein: 7.2 g/dL (ref 6.0–8.5)

## 2016-12-06 LAB — TSH: TSH: 0.954 u[IU]/mL (ref 0.450–4.500)

## 2016-12-13 ENCOUNTER — Ambulatory Visit: Payer: PPO | Admitting: Urology

## 2016-12-13 ENCOUNTER — Encounter: Payer: Self-pay | Admitting: Urology

## 2016-12-13 VITALS — BP 131/77 | HR 77 | Ht 68.0 in | Wt 178.1 lb

## 2016-12-13 DIAGNOSIS — C61 Malignant neoplasm of prostate: Secondary | ICD-10-CM

## 2016-12-13 MED ORDER — LEUPROLIDE ACETATE (3 MONTH) 22.5 MG IM KIT
22.5000 mg | PACK | Freq: Once | INTRAMUSCULAR | Status: AC
  Administered 2016-12-13: 22.5 mg via INTRAMUSCULAR

## 2016-12-13 NOTE — Addendum Note (Signed)
Addended by: Orlene Erm on: 12/13/2016 02:23 PM   Modules accepted: Orders

## 2016-12-13 NOTE — Progress Notes (Signed)
12/13/2016 2:12 PM   Todd [name] 1930/09/24 [id number]  Referring provider: Guadalupe Maple, MD [address] [place], [place] [address]  Chief Complaint  Patient presents with  . Prostate Cancer    Follow up  last seen 06/17    HPI: 1 - Prostate Cancer - treated with primary radiation for unknown grade / stage disease years ago, then biochemical recurrence.  Now on Lupron Q65monthly. However was lost to follow-up. Last Lupron injection in June 2017. No new skeletal pain. 04/2015 PSA 0.3 --> Lupron 45mg  11/2015 PSA 0.03 / T 11 --> Lupron 45mg   2 - Lower Urinary Tract Symptoms / Urge Urinary Incontinence - Slowly progressive bother for urienary urgency with inctontinence. PVR 04/2015 "71mL". Tried Vesicare 5mg  daily but no significant improvement. Cysto 04/2015 without stricture / mass / radiation cystitis. Given trial Myrbetriq and reports modest improvement.  However he has since stopped taking his medication. Not currently bothered by symptoms.   PMH: Past Medical History:  Diagnosis Date  . Accelerated hypertension 03/04/2015  . Anxiety   . Aortic regurgitation   . Arthritis    osteoarthritis-left knee  . CAD (coronary artery disease)   . Depression   . Diabetes mellitus without complication ([place])   . ED (erectile dysfunction)   . GERD (gastroesophageal reflux disease)   . Glaucoma   . Hyperlipidemia   . Hypertension   . Iron deficiency anemia due to chronic blood loss   . Left heart failure ([place])   . Lumbago    Lumbosacral Neuritis  . Prostate cancer [facility])     Surgical History: Past Surgical History:  Procedure Laterality Date  . CARPAL TUNNEL RELEASE    . REPLACEMENT TOTAL KNEE BILATERAL      Home Medications:  Allergies as of 12/13/2016      Reactions   Fluoxetine    headache   Meloxicam Other (See Comments)   Other reaction(s): Dizziness Nervousness.      Medication List       Accurate as of 12/13/16  2:12 PM. Always use your most recent med list.           aspirin EC 81 MG tablet Take 81 mg by mouth daily.   busPIRone 10 MG tablet Commonly known as:  BUSPAR Take 10 mg by mouth daily.   celecoxib 200 MG capsule Commonly known as:  CELEBREX   clonazePAM 0.5 MG tablet Commonly known as:  KLONOPIN Take 1 tablet (0.5 mg total) by mouth daily as needed for anxiety.   DULoxetine 20 MG capsule Commonly known as:  CYMBALTA Take 1 capsule (20 mg total) by mouth daily.   gabapentin 100 MG capsule Commonly known as:  NEURONTIN Take 100 mg by mouth 3 (three) times daily.   latanoprost 0.005 % ophthalmic solution Commonly known as:  XALATAN   metFORMIN 500 MG tablet Commonly known as:  GLUCOPHAGE Take 1 tablet (500 mg total) by mouth 2 (two) times daily with a meal.   propranolol ER 80 MG 24 hr capsule Commonly known as:  INDERAL LA Take 1 capsule (80 mg total) by mouth daily.   telmisartan 80 MG tablet Commonly known as:  MICARDIS Take 1 tablet (80 mg total) by mouth daily.       Allergies:  Allergies  Allergen Reactions  . Fluoxetine     headache  . Meloxicam Other (See Comments)    Other reaction(s): Dizziness Nervousness.     Family History: Family History  Problem Relation Age  of Onset  . Stroke Mother   . Hypertension Mother   . Hypertension Father   . Stroke Sister   . Prostate cancer Neg Hx   . Kidney cancer Neg Hx   . Bladder Cancer Neg Hx     Social History:  reports that he quit smoking about 22 years ago. He has never used smokeless tobacco. He reports that he does not drink alcohol or use drugs.  ROS: UROLOGY Frequent Urination?: No Hard to postpone urination?: No Burning/pain with urination?: No Get up at night to urinate?: No Leakage of urine?: Yes Urine stream starts and stops?: No Trouble starting stream?: No Do you have to strain to urinate?: No Blood in urine?: No Urinary tract infection?: No Sexually transmitted disease?: No Injury to kidneys or bladder?: No Painful  intercourse?: No Weak stream?: No Erection problems?: No Penile pain?: No  Gastrointestinal Nausea?: No Vomiting?: No Indigestion/heartburn?: No Diarrhea?: No Constipation?: No  Constitutional Fever: No Night sweats?: No Weight loss?: No Fatigue?: No  Skin Skin rash/lesions?: No Itching?: No  Eyes Blurred vision?: No Double vision?: No  Ears/Nose/Throat Sore throat?: No Sinus problems?: No  Hematologic/Lymphatic Swollen glands?: No Easy bruising?: No  Cardiovascular Leg swelling?: No Chest pain?: No  Respiratory Cough?: No Shortness of breath?: No  Endocrine Excessive thirst?: No  Musculoskeletal Back pain?: No Joint pain?: No  Neurological Headaches?: No Dizziness?: No  Psychologic Depression?: No Anxiety?: No  Physical Exam: BP 131/77   Pulse 77   Ht 5\' 8"  (1.727 m)   Wt 178 lb 1.6 oz (80.8 kg)   BMI 27.08 kg/m   Constitutional:  Alert and oriented, No acute distress. HEENT: [place] AT, moist mucus membranes.  Trachea midline, no masses. Cardiovascular: No clubbing, cyanosis, or edema. Respiratory: Normal respiratory effort, no increased work of breathing. GI: Abdomen is soft, nontender, nondistended, no abdominal masses GU: No CVA tenderness.  Skin: No rashes, bruises or suspicious lesions. Lymph: No cervical or inguinal adenopathy. Neurologic: Grossly intact, no focal deficits, moving all 4 extremities. Psychiatric: Normal mood and affect.  Laboratory Data: Lab Results  Component Value Date   WBC 5.9 12/05/2016   HGB 12.3 (L) 12/05/2016   HCT 39.2 12/05/2016   MCV 95 12/05/2016   PLT 251 12/05/2016    Lab Results  Component Value Date   CREATININE 1.26 12/05/2016    No results found for: PSA  Lab Results  Component Value Date   TESTOSTERONE 11 (L) 11/05/2015    Lab Results  Component Value Date   HGBA1C 6.2 (H) 03/05/2015    Urinalysis    Component Value Date/Time   COLORURINE YELLOW 11/01/2016 1831    APPEARANCEUR Clear 12/05/2016 1428   LABSPEC 1.025 11/01/2016 1831   PHURINE 5.5 11/01/2016 1831   GLUCOSEU Negative 12/05/2016 1428   HGBUR NEGATIVE 11/01/2016 1831   BILIRUBINUR Negative 12/05/2016 1428   KETONESUR NEGATIVE 11/01/2016 1831   PROTEINUR Negative 12/05/2016 1428   PROTEINUR NEGATIVE 11/01/2016 1831   NITRITE Negative 12/05/2016 1428   NITRITE NEGATIVE 11/01/2016 1831   LEUKOCYTESUR Negative 12/05/2016 1428     Assessment & Plan:    1 - Prostate Cancer - excellent biochemical control on continuous androgen deprivation previously. We'll resume Lupron today with a three-month shot as it is the only currently available dosage in our office. We'll check PSA/testosterone today. Follow-up in 3 months for repeat Lupron injection with testosterone and PSA prior. We should be able to return to the 6 month injection at that  time.  2 - Lower Urinary Tract Symptoms / Urge Urinary Incontinence - No stricture / obstruction or severe radiation cystitis by recent cysto. He empties well. Can resume myrbetriq 50 mg in the future if patient reports bother.  Return in about 3 months (around 03/15/2017) for lupron shot. PSA/T prior.  Nickie Retort, MD  [facility] [address], [place],  [address] [phone]

## 2016-12-13 NOTE — Progress Notes (Signed)
Lupron IM Injection   Due to Prostate Cancer patient is present today for a Lupron Injection.  Medication: Lupron 3 month Dose: 22.5 mg  Location: right upper outer buttocks Lot: [id number] Exp: 04/23/2019  Patient tolerated well, no complications were noted  Performed by: Lyndee Hensen CMA  Follow up: 3 months

## 2016-12-14 ENCOUNTER — Telehealth: Payer: Self-pay | Admitting: Family Medicine

## 2016-12-14 LAB — TESTOSTERONE: Testosterone: 115 ng/dL — ABNORMAL LOW (ref 264–916)

## 2016-12-14 LAB — PSA: Prostate Specific Ag, Serum: 3.5 ng/mL (ref 0.0–4.0)

## 2016-12-14 NOTE — Telephone Encounter (Signed)
-----   Message from Nickie Retort, MD sent at 12/14/2016 11:11 AM EDT -----  Please let patient/family know his PSA did rise from 0.3 to 3.5 over the last year. This is not surprising since he missed his hormone shot. Will recheck as planned in 3 months  ----- Message ----- From: Orlene Erm, CMA Sent: 12/14/2016   8:02 AM To: Nickie Retort, MD    ----- Message ----- From: Interface, [facility] Lab Results In Sent: 12/14/2016   5:42 AM To: [facility]

## 2016-12-14 NOTE — Telephone Encounter (Signed)
Patient's daughter notified and voiced understanding. 

## 2016-12-18 ENCOUNTER — Telehealth: Payer: Self-pay | Admitting: Family Medicine

## 2016-12-18 NOTE — Telephone Encounter (Signed)
Called and let patient's daughter know what Dr. Wynetta Emery said. Daughter did not have any further questions.

## 2016-12-18 NOTE — Telephone Encounter (Signed)
Routing to providers box. A letter was sent to the patient stating that the patient's labs had improved and were back to normal per [name]. Is there anything else to tell the family?

## 2016-12-18 NOTE — Telephone Encounter (Signed)
Labs look good. OK to tell patient's daughter that labs are improved and back to normal. Please let me know if she needs to talk to me. Thanks!

## 2016-12-18 NOTE — Telephone Encounter (Signed)
Pts daughter Earnest Mcgillis would like a call back regarding recent lab work. She in unaware that Dr Jeananne Rama is not in the office but would appreciate a call back from whomever is taking care of these things in his absence.

## 2016-12-27 DIAGNOSIS — H401131 Primary open-angle glaucoma, bilateral, mild stage: Secondary | ICD-10-CM | POA: Diagnosis not present

## 2016-12-27 LAB — HM DIABETES EYE EXAM

## 2016-12-29 ENCOUNTER — Telehealth: Payer: Self-pay | Admitting: Family Medicine

## 2016-12-29 DIAGNOSIS — M5136 Other intervertebral disc degeneration, lumbar region: Secondary | ICD-10-CM

## 2016-12-29 DIAGNOSIS — M5116 Intervertebral disc disorders with radiculopathy, lumbar region: Secondary | ICD-10-CM

## 2016-12-29 NOTE — Telephone Encounter (Signed)
Okay to place referral for PT? Please advise.  

## 2017-01-01 NOTE — Telephone Encounter (Signed)
PT is OK

## 2017-01-01 NOTE — Telephone Encounter (Signed)
Spoke with [facility]. PT referral to go to [facility].

## 2017-01-03 NOTE — Telephone Encounter (Signed)
Pt would like to go to [facility] Not the [facility] Can you change this referral please?

## 2017-01-03 NOTE — Telephone Encounter (Signed)
Refaxed referral to [facility] at PT's request.

## 2017-01-04 DIAGNOSIS — M25662 Stiffness of left knee, not elsewhere classified: Secondary | ICD-10-CM | POA: Diagnosis not present

## 2017-01-04 DIAGNOSIS — M6281 Muscle weakness (generalized): Secondary | ICD-10-CM | POA: Diagnosis not present

## 2017-01-04 DIAGNOSIS — M25661 Stiffness of right knee, not elsewhere classified: Secondary | ICD-10-CM | POA: Diagnosis not present

## 2017-01-04 DIAGNOSIS — M24659 Ankylosis, unspecified hip: Secondary | ICD-10-CM | POA: Diagnosis not present

## 2017-01-08 ENCOUNTER — Ambulatory Visit: Payer: PPO | Admitting: Family Medicine

## 2017-01-09 ENCOUNTER — Encounter: Payer: Self-pay | Admitting: Family Medicine

## 2017-01-09 ENCOUNTER — Ambulatory Visit (INDEPENDENT_AMBULATORY_CARE_PROVIDER_SITE_OTHER): Payer: PPO | Admitting: Family Medicine

## 2017-01-09 ENCOUNTER — Ambulatory Visit: Payer: Self-pay | Admitting: Family Medicine

## 2017-01-09 VITALS — BP 122/64 | HR 60 | Wt 179.4 lb

## 2017-01-09 DIAGNOSIS — E119 Type 2 diabetes mellitus without complications: Secondary | ICD-10-CM

## 2017-01-09 DIAGNOSIS — R8290 Unspecified abnormal findings in urine: Secondary | ICD-10-CM

## 2017-01-09 DIAGNOSIS — B379 Candidiasis, unspecified: Secondary | ICD-10-CM

## 2017-01-09 LAB — MICROSCOPIC EXAMINATION: Bacteria, UA: NONE SEEN

## 2017-01-09 LAB — UA/M W/RFLX CULTURE, ROUTINE
Bilirubin, UA: NEGATIVE
Glucose, UA: NEGATIVE
Ketones, UA: NEGATIVE
Leukocytes, UA: NEGATIVE
Nitrite, UA: NEGATIVE
Protein, UA: NEGATIVE
RBC, UA: NEGATIVE
Specific Gravity, UA: 1.025 (ref 1.005–1.030)
Urobilinogen, Ur: 0.2 mg/dL (ref 0.2–1.0)
pH, UA: 5 (ref 5.0–7.5)

## 2017-01-09 MED ORDER — METFORMIN HCL 500 MG PO TABS: 500.0000 mg | ORAL_TABLET | Freq: Every day | ORAL | 4 refills | Status: DC

## 2017-01-09 MED ORDER — NYSTATIN 100000 UNIT/GM EX POWD: Freq: Four times a day (QID) | CUTANEOUS | 0 refills | Status: DC

## 2017-01-09 NOTE — Progress Notes (Signed)
BP 122/64   Pulse 60   Wt 179 lb 6 oz (81.4 kg)   SpO2 100%   BMI 27.27 kg/m    Subjective:    Patient ID: Todd [name], male    DOB: 1930-07-22, 83 y.o.   MRN: [id number]  HPI: Todd [name] is a 83 y.o. male  Chief Complaint  Patient presents with  . Rash    Patient states that his inner thighs are darkened and irritated.  . Labs Only    Patients daughter would like to discuss his last lab results.    RASH Duration:  weeks  Location: groin  Itching: yes Burning: no Redness: yes Oozing: no Scaling: no Blisters: no Painful: no Fevers: no Change in detergents/soaps/personal care products: no Recent illness: no Recent travel:no History of same: no Context: stable Alleviating factors: nothing Treatments attempted:nothing Shortness of breath: no  Throat/tongue swelling: no Myalgias/arthralgias: no  Daughter is concerned about his sugars- notes that his Rx for metformin says to take 2x a day, but he's been taking it 1x a day for years and wonders if he should change.   Has been having very foul smelling and yellow urine. Would like it checked.   Relevant past medical, surgical, family and social history reviewed and updated as indicated. Interim medical history since our last visit reviewed. Allergies and medications reviewed and updated.  Review of Systems  Constitutional: Negative.   Respiratory: Negative.   Cardiovascular: Negative.   Genitourinary: Positive for urgency. Negative for decreased urine volume, difficulty urinating, discharge, dysuria, enuresis, flank pain, frequency, genital sores, hematuria, penile pain, penile swelling, scrotal swelling and testicular pain.  Skin: Positive for rash. Negative for color change, pallor and wound.  Psychiatric/Behavioral: Negative.     Per HPI unless specifically indicated above     Objective:    BP 122/64   Pulse 60   Wt 179 lb 6 oz (81.4 kg)   SpO2 100%   BMI 27.27 kg/m   Wt Readings from Last 3  Encounters:  01/09/17 179 lb 6 oz (81.4 kg)  12/13/16 178 lb 1.6 oz (80.8 kg)  12/05/16 178 lb (80.7 kg)    Physical Exam  Constitutional: He is oriented to person, place, and time. He appears well-developed and well-nourished. No distress.  HENT:  Head: Normocephalic and atraumatic.  Right Ear: Hearing normal.  Left Ear: Hearing normal.  Nose: Nose normal.  Eyes: Conjunctivae and lids are normal. Right eye exhibits no discharge. Left eye exhibits no discharge. No scleral icterus.  Cardiovascular: Normal rate, regular rhythm, normal heart sounds and intact distal pulses.  Exam reveals no gallop and no friction rub.   No murmur heard. Pulmonary/Chest: Effort normal and breath sounds normal. No respiratory distress. He has no wheezes. He has no rales. He exhibits no tenderness.  Musculoskeletal: Normal range of motion.  Neurological: He is alert and oriented to person, place, and time.  Skin: Skin is warm, dry and intact. Rash (hyperpigmented irriated area in R groin) noted. He is not diaphoretic. No erythema. No pallor.  Psychiatric: He has a normal mood and affect. His speech is normal and behavior is normal. Judgment and thought content normal. Cognition and memory are normal.  Nursing note and vitals reviewed.   Results for orders placed or performed in visit on 01/02/17  HM DIABETES EYE EXAM  Result Value Ref Range   HM Diabetic Eye Exam No Retinopathy No Retinopathy      Assessment & Plan:   Problem List  Items Addressed This Visit      Endocrine   Diabetes mellitus without complication [facility])    Discussed with Daughter that last A1c was under good control. Don't change dose from what he's been taking Rx in the computer adjusted to what he has been taking.       Relevant Medications   metFORMIN (GLUCOPHAGE) 500 MG tablet    Other Visit Diagnoses    Candida infection    -  Primary   Relevant Medications   nystatin (MYCOSTATIN/NYSTOP) powder   Foul smelling urine        UA clear. Advised increased fluids. Continue to monitor.    Relevant Orders   UA/M w/rflx Culture, Routine       Follow up plan: Return As scheduled with MAC.

## 2017-01-09 NOTE — Assessment & Plan Note (Signed)
Discussed with Daughter that last A1c was under good control. Don't change dose from what he's been taking Rx in the computer adjusted to what he has been taking.

## 2017-01-10 ENCOUNTER — Ambulatory Visit: Payer: PPO | Admitting: Physical Therapy

## 2017-01-11 DIAGNOSIS — M6281 Muscle weakness (generalized): Secondary | ICD-10-CM | POA: Diagnosis not present

## 2017-01-19 ENCOUNTER — Telehealth: Payer: Self-pay | Admitting: Family Medicine

## 2017-01-19 DIAGNOSIS — G629 Polyneuropathy, unspecified: Secondary | ICD-10-CM | POA: Diagnosis not present

## 2017-01-19 DIAGNOSIS — F0150 Vascular dementia without behavioral disturbance: Secondary | ICD-10-CM | POA: Diagnosis not present

## 2017-01-19 DIAGNOSIS — G250 Essential tremor: Secondary | ICD-10-CM | POA: Diagnosis not present

## 2017-01-19 DIAGNOSIS — G5603 Carpal tunnel syndrome, bilateral upper limbs: Secondary | ICD-10-CM | POA: Diagnosis not present

## 2017-01-19 DIAGNOSIS — F411 Generalized anxiety disorder: Secondary | ICD-10-CM | POA: Diagnosis not present

## 2017-01-19 MED ORDER — CLOTRIMAZOLE-BETAMETHASONE 1-0.05 % EX CREA: 1.0000 "application " | TOPICAL_CREAM | Freq: Two times a day (BID) | CUTANEOUS | 0 refills | Status: DC

## 2017-01-19 NOTE — Telephone Encounter (Signed)
done

## 2017-01-19 NOTE — Telephone Encounter (Signed)
Called and left patient's daughter a VM (signed DPR) letting them know that a different prescription has been sent in for the patient to use. I asked for them to give [place] a call with any questions or concerns.

## 2017-01-19 NOTE — Telephone Encounter (Signed)
Patient's daughter [name]) would like a different RX called in to [facility] for her father's yeast infection. Caller states that her father (Mr. Breon) still has the rash on his inner thigh and groin area and that it is not getting any better.  Please call [name] at [phone] to advise.

## 2017-01-19 NOTE — Telephone Encounter (Signed)
Routing to provider. Patient seen by Dr. Wynetta Emery 01/09/17.

## 2017-01-23 DIAGNOSIS — M6281 Muscle weakness (generalized): Secondary | ICD-10-CM | POA: Diagnosis not present

## 2017-01-23 DIAGNOSIS — M25662 Stiffness of left knee, not elsewhere classified: Secondary | ICD-10-CM | POA: Diagnosis not present

## 2017-01-23 DIAGNOSIS — M25661 Stiffness of right knee, not elsewhere classified: Secondary | ICD-10-CM | POA: Diagnosis not present

## 2017-01-25 DIAGNOSIS — M6281 Muscle weakness (generalized): Secondary | ICD-10-CM | POA: Diagnosis not present

## 2017-01-25 DIAGNOSIS — M25661 Stiffness of right knee, not elsewhere classified: Secondary | ICD-10-CM | POA: Diagnosis not present

## 2017-01-25 DIAGNOSIS — M25662 Stiffness of left knee, not elsewhere classified: Secondary | ICD-10-CM | POA: Diagnosis not present

## 2017-01-30 DIAGNOSIS — M25661 Stiffness of right knee, not elsewhere classified: Secondary | ICD-10-CM | POA: Diagnosis not present

## 2017-01-30 DIAGNOSIS — M25662 Stiffness of left knee, not elsewhere classified: Secondary | ICD-10-CM | POA: Diagnosis not present

## 2017-01-30 DIAGNOSIS — M24659 Ankylosis, unspecified hip: Secondary | ICD-10-CM | POA: Diagnosis not present

## 2017-01-30 DIAGNOSIS — M6281 Muscle weakness (generalized): Secondary | ICD-10-CM | POA: Diagnosis not present

## 2017-02-07 DIAGNOSIS — E1140 Type 2 diabetes mellitus with diabetic neuropathy, unspecified: Secondary | ICD-10-CM | POA: Diagnosis not present

## 2017-02-07 DIAGNOSIS — B351 Tinea unguium: Secondary | ICD-10-CM | POA: Diagnosis not present

## 2017-02-08 ENCOUNTER — Telehealth: Payer: Self-pay | Admitting: Family Medicine

## 2017-02-08 DIAGNOSIS — M25662 Stiffness of left knee, not elsewhere classified: Secondary | ICD-10-CM | POA: Diagnosis not present

## 2017-02-08 DIAGNOSIS — M6281 Muscle weakness (generalized): Secondary | ICD-10-CM | POA: Diagnosis not present

## 2017-02-08 DIAGNOSIS — M25661 Stiffness of right knee, not elsewhere classified: Secondary | ICD-10-CM | POA: Diagnosis not present

## 2017-02-08 NOTE — Telephone Encounter (Signed)
Patient's daughter would like to see if patient can be referred to [facility] so patient can get into the sauna and hot tube to help with patient's back and knees.  Please Advise.  Thank you

## 2017-02-08 NOTE — Telephone Encounter (Signed)
Please advise 

## 2017-02-08 NOTE — Telephone Encounter (Signed)
Needing an order for a back brace. Patient able to afford the back brace at a medical facility. Patient's daughter cannot think of the name of the facility but would like to see if she could pick up the order for patient and take it to the medical facility.  Please Advise.  Thank you

## 2017-02-13 DIAGNOSIS — M25662 Stiffness of left knee, not elsewhere classified: Secondary | ICD-10-CM | POA: Diagnosis not present

## 2017-02-13 DIAGNOSIS — M24659 Ankylosis, unspecified hip: Secondary | ICD-10-CM | POA: Diagnosis not present

## 2017-02-13 DIAGNOSIS — M25661 Stiffness of right knee, not elsewhere classified: Secondary | ICD-10-CM | POA: Diagnosis not present

## 2017-02-13 DIAGNOSIS — M6281 Muscle weakness (generalized): Secondary | ICD-10-CM | POA: Diagnosis not present

## 2017-02-19 DIAGNOSIS — M25561 Pain in right knee: Secondary | ICD-10-CM | POA: Diagnosis not present

## 2017-02-19 DIAGNOSIS — M25562 Pain in left knee: Secondary | ICD-10-CM | POA: Diagnosis not present

## 2017-02-19 DIAGNOSIS — R262 Difficulty in walking, not elsewhere classified: Secondary | ICD-10-CM | POA: Diagnosis not present

## 2017-02-28 DIAGNOSIS — M25562 Pain in left knee: Secondary | ICD-10-CM | POA: Diagnosis not present

## 2017-02-28 DIAGNOSIS — R262 Difficulty in walking, not elsewhere classified: Secondary | ICD-10-CM | POA: Diagnosis not present

## 2017-02-28 DIAGNOSIS — M25561 Pain in right knee: Secondary | ICD-10-CM | POA: Diagnosis not present

## 2017-03-02 DIAGNOSIS — M25561 Pain in right knee: Secondary | ICD-10-CM | POA: Diagnosis not present

## 2017-03-02 DIAGNOSIS — R262 Difficulty in walking, not elsewhere classified: Secondary | ICD-10-CM | POA: Diagnosis not present

## 2017-03-02 DIAGNOSIS — M25562 Pain in left knee: Secondary | ICD-10-CM | POA: Diagnosis not present

## 2017-03-06 DIAGNOSIS — M25561 Pain in right knee: Secondary | ICD-10-CM | POA: Diagnosis not present

## 2017-03-06 DIAGNOSIS — R262 Difficulty in walking, not elsewhere classified: Secondary | ICD-10-CM | POA: Diagnosis not present

## 2017-03-06 DIAGNOSIS — M25562 Pain in left knee: Secondary | ICD-10-CM | POA: Diagnosis not present

## 2017-03-08 ENCOUNTER — Other Ambulatory Visit: Payer: PPO

## 2017-03-08 DIAGNOSIS — M25561 Pain in right knee: Secondary | ICD-10-CM | POA: Diagnosis not present

## 2017-03-08 DIAGNOSIS — M25562 Pain in left knee: Secondary | ICD-10-CM | POA: Diagnosis not present

## 2017-03-08 DIAGNOSIS — C61 Malignant neoplasm of prostate: Secondary | ICD-10-CM | POA: Diagnosis not present

## 2017-03-08 DIAGNOSIS — R262 Difficulty in walking, not elsewhere classified: Secondary | ICD-10-CM | POA: Diagnosis not present

## 2017-03-09 LAB — TESTOSTERONE: Testosterone: 8 ng/dL — ABNORMAL LOW (ref 264–916)

## 2017-03-09 LAB — PSA: Prostate Specific Ag, Serum: 2 ng/mL (ref 0.0–4.0)

## 2017-03-13 ENCOUNTER — Ambulatory Visit: Payer: PPO | Admitting: Family Medicine

## 2017-03-13 DIAGNOSIS — R262 Difficulty in walking, not elsewhere classified: Secondary | ICD-10-CM | POA: Diagnosis not present

## 2017-03-13 DIAGNOSIS — M25562 Pain in left knee: Secondary | ICD-10-CM | POA: Diagnosis not present

## 2017-03-13 DIAGNOSIS — M25561 Pain in right knee: Secondary | ICD-10-CM | POA: Diagnosis not present

## 2017-03-15 ENCOUNTER — Ambulatory Visit: Payer: PPO | Admitting: Urology

## 2017-03-15 ENCOUNTER — Encounter: Payer: Self-pay | Admitting: Urology

## 2017-03-15 VITALS — BP 164/71 | HR 74 | Ht 68.0 in | Wt 176.5 lb

## 2017-03-15 DIAGNOSIS — C61 Malignant neoplasm of prostate: Secondary | ICD-10-CM

## 2017-03-15 MED ORDER — LEUPROLIDE ACETATE (6 MONTH) 45 MG IM KIT
45.0000 mg | PACK | Freq: Once | INTRAMUSCULAR | Status: AC
  Administered 2017-03-15: 45 mg via INTRAMUSCULAR

## 2017-03-15 NOTE — Addendum Note (Signed)
Addended by: Kyra Manges on: 03/15/2017 02:32 PM   Modules accepted: Orders

## 2017-03-15 NOTE — Progress Notes (Signed)
03/15/2017 2:23 PM   [name] 1930/09/01 [id number]  Referring provider: Guadalupe Maple, MD [address] [place], [place] [address]  Chief Complaint  Patient presents with  . Prostate Cancer    HPI: 1 - Prostate Cancer- treated with primary radiation for unknown grade / stage disease years ago, then biochemical recurrence. Now on Lupron Q28monthly. However was lost to follow-up. Received last 6 month injection of Lupron injection in June 2017 Did not receive another until July 2018 which is only a three-month injection. No new skeletal pain. 04/2015 PSA 0.3 --> Lupron 45mg  11/2015 PSA 0.03 / T 11 --> Lupron 45mg  (Lost to follow up) 12/2016 PSA 3.5/ T115 --> Lupron 22.5 mg 03/2017 PSA 2.0/T 8 --> Lupron 45 mg  2 - Lower Urinary Tract Symptoms / Urge Urinary Incontinence- Slowly progressive bother for urienary urgency with inctontinence. PVR 04/2015 "56mL". Tried Vesicare 5mg  daily but no significant improvement. Cysto 04/2015 without stricture / mass / radiation cystitis. Given trial Myrbetriq and reports modest improvement.  However he has since stopped taking his medication. Not currently bothered by symptoms.     PMH: Past Medical History:  Diagnosis Date  . Accelerated hypertension 03/04/2015  . Anxiety   . Aortic regurgitation   . Arthritis    osteoarthritis-left knee  . CAD (coronary artery disease)   . Depression   . Diabetes mellitus without complication ([place])   . ED (erectile dysfunction)   . GERD (gastroesophageal reflux disease)   . Glaucoma   . Hyperlipidemia   . Hypertension   . Iron deficiency anemia due to chronic blood loss   . Left heart failure ([place])   . Lumbago    Lumbosacral Neuritis  . Prostate cancer [facility])     Surgical History: Past Surgical History:  Procedure Laterality Date  . CARPAL TUNNEL RELEASE    . REPLACEMENT TOTAL KNEE BILATERAL      Home Medications:  Allergies as of 03/15/2017      Reactions   Fluoxetine    headache   Meloxicam Other (See Comments)   Other reaction(s): Dizziness Nervousness.      Medication List       Accurate as of 03/15/17  2:23 PM. Always use your most recent med list.          aspirin EC 81 MG tablet Take 81 mg by mouth daily.   clonazePAM 0.5 MG tablet Commonly known as:  KLONOPIN Take 1 tablet (0.5 mg total) by mouth daily as needed for anxiety.   clotrimazole-betamethasone cream Commonly known as:  LOTRISONE Apply 1 application topically 2 (two) times daily.   DULoxetine 20 MG capsule Commonly known as:  CYMBALTA Take 1 capsule (20 mg total) by mouth daily.   gabapentin 100 MG capsule Commonly known as:  NEURONTIN Take 100 mg by mouth 2 (two) times daily.   latanoprost 0.005 % ophthalmic solution Commonly known as:  XALATAN   metFORMIN 500 MG tablet Commonly known as:  GLUCOPHAGE Take 1 tablet (500 mg total) by mouth daily with breakfast.   nystatin powder Commonly known as:  MYCOSTATIN/NYSTOP Apply topically 4 (four) times daily.   propranolol ER 80 MG 24 hr capsule Commonly known as:  INDERAL LA Take 1 capsule (80 mg total) by mouth daily.   telmisartan 80 MG tablet Commonly known as:  MICARDIS Take 1 tablet (80 mg total) by mouth daily.       Allergies:  Allergies  Allergen Reactions  . Fluoxetine  headache  . Meloxicam Other (See Comments)    Other reaction(s): Dizziness Nervousness.     Family History: Family History  Problem Relation Age of Onset  . Stroke Mother   . Hypertension Mother   . Hypertension Father   . Stroke Sister   . Prostate cancer Neg Hx   . Kidney cancer Neg Hx   . Bladder Cancer Neg Hx     Social History:  reports that he quit smoking about 22 years ago. He has never used smokeless tobacco. He reports that he does not drink alcohol or use drugs.  ROS: UROLOGY Frequent Urination?: No Hard to postpone urination?: No Burning/pain with urination?: No Get up at night to urinate?: No Leakage of  urine?: No Urine stream starts and stops?: No Trouble starting stream?: No Do you have to strain to urinate?: No Blood in urine?: No Urinary tract infection?: No Sexually transmitted disease?: No Injury to kidneys or bladder?: No Painful intercourse?: No Weak stream?: No Erection problems?: No Penile pain?: No  Gastrointestinal Nausea?: No Vomiting?: No Indigestion/heartburn?: No Diarrhea?: No Constipation?: No  Constitutional Fever: No Night sweats?: No Weight loss?: No Fatigue?: No  Skin Skin rash/lesions?: No Itching?: No  Eyes Blurred vision?: No Double vision?: No  Ears/Nose/Throat Sore throat?: No Sinus problems?: No  Hematologic/Lymphatic Swollen glands?: No Easy bruising?: No  Cardiovascular Leg swelling?: No  Respiratory Cough?: No Shortness of breath?: Yes  Endocrine Excessive thirst?: No  Musculoskeletal Back pain?: Yes Joint pain?: Yes  Neurological Headaches?: No Dizziness?: No  Psychologic Depression?: No Anxiety?: No  Physical Exam: BP (!) 164/71   Pulse 74   Ht 5\' 8"  (1.727 m)   Wt 176 lb 8 oz (80.1 kg)   BMI 26.84 kg/m   Constitutional:  Alert and oriented, No acute distress. HEENT: [place] AT, moist mucus membranes.  Trachea midline, no masses. Cardiovascular: No clubbing, cyanosis, or edema. Respiratory: Normal respiratory effort, no increased work of breathing. GI: Abdomen is soft, nontender, nondistended, no abdominal masses GU: No CVA tenderness.  Skin: No rashes, bruises or suspicious lesions. Lymph: No cervical or inguinal adenopathy. Neurologic: Grossly intact, no focal deficits, moving all 4 extremities. Psychiatric: Normal mood and affect.  Laboratory Data: Lab Results  Component Value Date   WBC 5.9 12/05/2016   HGB 12.3 (L) 12/05/2016   HCT 39.2 12/05/2016   MCV 95 12/05/2016   PLT 251 12/05/2016    Lab Results  Component Value Date   CREATININE 1.26 12/05/2016    No results found for: PSA  Lab  Results  Component Value Date   TESTOSTERONE 8 (L) 03/08/2017    Lab Results  Component Value Date   HGBA1C 6.2 (H) 03/05/2015    Urinalysis    Component Value Date/Time   COLORURINE YELLOW 11/01/2016 1831   APPEARANCEUR Clear 01/09/2017 1359   LABSPEC 1.025 11/01/2016 1831   PHURINE 5.5 11/01/2016 1831   GLUCOSEU Negative 01/09/2017 1359   HGBUR NEGATIVE 11/01/2016 1831   BILIRUBINUR Negative 01/09/2017 1359   KETONESUR NEGATIVE 11/01/2016 1831   PROTEINUR Negative 01/09/2017 1359   PROTEINUR NEGATIVE 11/01/2016 1831   NITRITE Negative 01/09/2017 1359   NITRITE NEGATIVE 11/01/2016 1831   LEUKOCYTESUR Negative 01/09/2017 1359    Assessment & Plan:    1 - Prostate Cancer- excellent biochemical control on continuous androgen deprivation previously. We'll resume Lupron today with a three-month shot as it is the only currently available dosage in our office. We'll check PSA/testosterone today. Follow-up in 3 months  for repeat Lupron injection with testosterone and PSA prior. We should be able to return to the 6 month injection at that time.  2 - Lower Urinary Tract Symptoms / Urge Urinary Incontinence- No stricture / obstruction or severe radiation cystitis by recent cysto. He empties well. Can resume myrbetriq 50 mg in the future if patient reports bother.  Return in about 6 months (around 09/13/2017) for for lupron. PSA/T prior.  Nickie Retort, MD  [facility] [address] [place], [place] [address] [phone]

## 2017-03-17 ENCOUNTER — Observation Stay
Admission: EM | Admit: 2017-03-17 | Discharge: 2017-03-17 | Disposition: A | Discharge status: Home / Self Care | Payer: PPO | Attending: Internal Medicine | Admitting: Internal Medicine

## 2017-03-17 ENCOUNTER — Observation Stay: Payer: PPO

## 2017-03-17 ENCOUNTER — Emergency Department: Payer: PPO

## 2017-03-17 ENCOUNTER — Observation Stay
Admit: 2017-03-17 | Discharge: 2017-03-17 | Disposition: A | Discharge status: Home / Self Care | Payer: PPO | Attending: Internal Medicine | Admitting: Internal Medicine

## 2017-03-17 DIAGNOSIS — Z23 Encounter for immunization: Secondary | ICD-10-CM | POA: Diagnosis not present

## 2017-03-17 DIAGNOSIS — Z8546 Personal history of malignant neoplasm of prostate: Secondary | ICD-10-CM | POA: Insufficient documentation

## 2017-03-17 DIAGNOSIS — H409 Unspecified glaucoma: Secondary | ICD-10-CM | POA: Insufficient documentation

## 2017-03-17 DIAGNOSIS — Z79899 Other long term (current) drug therapy: Secondary | ICD-10-CM | POA: Insufficient documentation

## 2017-03-17 DIAGNOSIS — R7989 Other specified abnormal findings of blood chemistry: Secondary | ICD-10-CM

## 2017-03-17 DIAGNOSIS — Z96653 Presence of artificial knee joint, bilateral: Secondary | ICD-10-CM | POA: Insufficient documentation

## 2017-03-17 DIAGNOSIS — Z87891 Personal history of nicotine dependence: Secondary | ICD-10-CM | POA: Insufficient documentation

## 2017-03-17 DIAGNOSIS — E785 Hyperlipidemia, unspecified: Secondary | ICD-10-CM | POA: Insufficient documentation

## 2017-03-17 DIAGNOSIS — R079 Chest pain, unspecified: Secondary | ICD-10-CM | POA: Diagnosis not present

## 2017-03-17 DIAGNOSIS — K219 Gastro-esophageal reflux disease without esophagitis: Secondary | ICD-10-CM | POA: Diagnosis not present

## 2017-03-17 DIAGNOSIS — R0789 Other chest pain: Principal | ICD-10-CM | POA: Insufficient documentation

## 2017-03-17 DIAGNOSIS — Z7982 Long term (current) use of aspirin: Secondary | ICD-10-CM | POA: Diagnosis not present

## 2017-03-17 DIAGNOSIS — I10 Essential (primary) hypertension: Secondary | ICD-10-CM | POA: Diagnosis not present

## 2017-03-17 DIAGNOSIS — R778 Other specified abnormalities of plasma proteins: Secondary | ICD-10-CM

## 2017-03-17 DIAGNOSIS — I2510 Atherosclerotic heart disease of native coronary artery without angina pectoris: Secondary | ICD-10-CM | POA: Insufficient documentation

## 2017-03-17 DIAGNOSIS — R072 Precordial pain: Secondary | ICD-10-CM | POA: Diagnosis not present

## 2017-03-17 DIAGNOSIS — Z7984 Long term (current) use of oral hypoglycemic drugs: Secondary | ICD-10-CM | POA: Insufficient documentation

## 2017-03-17 DIAGNOSIS — E119 Type 2 diabetes mellitus without complications: Secondary | ICD-10-CM | POA: Diagnosis not present

## 2017-03-17 LAB — COMPREHENSIVE METABOLIC PANEL
ALT: 18 U/L (ref 17–63)
AST: 60 U/L — ABNORMAL HIGH (ref 15–41)
Albumin: 3.4 g/dL — ABNORMAL LOW (ref 3.5–5.0)
Alkaline Phosphatase: 75 U/L (ref 38–126)
Anion gap: 8 (ref 5–15)
BUN: 20 mg/dL (ref 6–20)
CO2: 23 mmol/L (ref 22–32)
Calcium: 7.6 mg/dL — ABNORMAL LOW (ref 8.9–10.3)
Chloride: 110 mmol/L (ref 101–111)
Creatinine, Ser: 0.88 mg/dL (ref 0.61–1.24)
GFR calc Af Amer: >60 mL/min (ref >60)
GFR calc non Af Amer: >60 mL/min (ref >60)
Glucose, Bld: 143 mg/dL — ABNORMAL HIGH (ref 65–99)
Potassium: 3.8 mmol/L (ref 3.5–5.1)
Sodium: 141 mmol/L (ref 135–145)
Total Bilirubin: 0.7 mg/dL (ref 0.3–1.2)
Total Protein: 5.8 g/dL — ABNORMAL LOW (ref 6.5–8.1)

## 2017-03-17 LAB — LIPID PANEL
Cholesterol: 163 mg/dL (ref 0–200)
HDL: 42 mg/dL (ref >40)
LDL Cholesterol: 106 mg/dL — ABNORMAL HIGH (ref 0–99)
Total CHOL/HDL Ratio: 3.9 RATIO
Triglycerides: 74 mg/dL (ref <150)
VLDL: 15 mg/dL (ref 0–40)

## 2017-03-17 LAB — BASIC METABOLIC PANEL
Anion gap: 7 (ref 5–15)
BUN: 20 mg/dL (ref 6–20)
CO2: 25 mmol/L (ref 22–32)
Calcium: 8.9 mg/dL (ref 8.9–10.3)
Chloride: 107 mmol/L (ref 101–111)
Creatinine, Ser: 1 mg/dL (ref 0.61–1.24)
GFR calc Af Amer: >60 mL/min (ref >60)
GFR calc non Af Amer: >60 mL/min (ref >60)
Glucose, Bld: 128 mg/dL — ABNORMAL HIGH (ref 65–99)
Potassium: 4 mmol/L (ref 3.5–5.1)
Sodium: 139 mmol/L (ref 135–145)

## 2017-03-17 LAB — CBC
HCT: 30.1 % — ABNORMAL LOW (ref 40.0–52.0)
HCT: 32.3 % — ABNORMAL LOW (ref 40.0–52.0)
Hemoglobin: 10.7 g/dL — ABNORMAL LOW (ref 13.0–18.0)
Hemoglobin: 9.9 g/dL — ABNORMAL LOW (ref 13.0–18.0)
MCH: 31.9 pg (ref 26.0–34.0)
MCH: 32.1 pg (ref 26.0–34.0)
MCHC: 32.9 g/dL (ref 32.0–36.0)
MCHC: 33 g/dL (ref 32.0–36.0)
MCV: 96.7 fL (ref 80.0–100.0)
MCV: 97.6 fL (ref 80.0–100.0)
Platelets: 180 10*3/uL (ref 150–440)
Platelets: 193 10*3/uL (ref 150–440)
RBC: 3.08 MIL/uL — ABNORMAL LOW (ref 4.40–5.90)
RBC: 3.34 MIL/uL — ABNORMAL LOW (ref 4.40–5.90)
RDW: 13.5 % (ref 11.5–14.5)
RDW: 13.6 % (ref 11.5–14.5)
WBC: 4.3 10*3/uL (ref 3.8–10.6)
WBC: 5.4 10*3/uL (ref 3.8–10.6)

## 2017-03-17 LAB — ECHOCARDIOGRAM COMPLETE
Area-P 1/2: 2.34 cm2
E decel time: 320 msec
E/e' ratio: 21.32
FS: 27 % — AB (ref 28–44)
Height: 67 in
IVS/LV PW RATIO, ED: 0.9
LA ID, A-P, ES: 42 mm
LA diam end sys: 42 mm
LA diam index: 2.15 cm/m2
LA vol A4C: 49.5 ml
LV E/e' medial: 21.32
LV E/e'average: 21.32
LV PW d: 14.5 mm — AB (ref 0.6–1.1)
LV e' LATERAL: 4.38 cm/s
Lateral S' vel: 14.2 cm/s
MV Dec: 320
MV Peak grad: 3 mmHg
MV pk A vel: 106 m/s
MV pk E vel: 93.4 m/s
P 1/2 time: 734 ms
P 1/2 time: 94 ms
TAPSE: 29.5 mm
TDI e' lateral: 4.38
TDI e' medial: 3.68
Weight: 2806.4 oz

## 2017-03-17 LAB — NM MYOCAR MULTI W/SPECT W/WALL MOTION / EF
LV dias vol: 94 mL (ref 62–150)
LV sys vol: 46 mL
SDS: 0
SRS: 6
SSS: 4
TID: 0.89

## 2017-03-17 LAB — TROPONIN I
Troponin I: 0.03 ng/mL (ref <0.03)
Troponin I: 0.04 ng/mL (ref <0.03)

## 2017-03-17 MED ORDER — ONDANSETRON HCL 4 MG/2ML IJ SOLN: 4.0000 mg | Freq: Four times a day (QID) | INTRAMUSCULAR | Status: DC | PRN

## 2017-03-17 MED ORDER — PANTOPRAZOLE SODIUM 40 MG PO TBEC: 40.0000 mg | DELAYED_RELEASE_TABLET | Freq: Every day | ORAL | 0 refills | Status: DC

## 2017-03-17 MED ORDER — LATANOPROST 0.005 % OP SOLN
1.0000 [drp] | Freq: Every day | OPHTHALMIC | Status: DC
  Filled 2017-03-17: qty 2.5

## 2017-03-17 MED ORDER — INFLUENZA VAC SPLIT HIGH-DOSE 0.5 ML IM SUSY: 0.5000 mL | PREFILLED_SYRINGE | INTRAMUSCULAR | Status: DC

## 2017-03-17 MED ORDER — ENOXAPARIN SODIUM 40 MG/0.4ML ~~LOC~~ SOLN
40.0000 mg | SUBCUTANEOUS | Status: DC
  Administered 2017-03-17: 40 mg via SUBCUTANEOUS
  Filled 2017-03-17: qty 0.4

## 2017-03-17 MED ORDER — REGADENOSON 0.4 MG/5ML IV SOLN
0.4000 mg | Freq: Once | INTRAVENOUS | Status: AC
  Administered 2017-03-17: 0.4 mg via INTRAVENOUS
  Filled 2017-03-17: qty 5

## 2017-03-17 MED ORDER — MORPHINE SULFATE (PF) 2 MG/ML IV SOLN: 2.0000 mg | INTRAVENOUS | Status: DC | PRN

## 2017-03-17 MED ORDER — IRBESARTAN 75 MG PO TABS
75.0000 mg | ORAL_TABLET | Freq: Every day | ORAL | Status: DC
  Administered 2017-03-17: 75 mg via ORAL
  Filled 2017-03-17 (×2): qty 1

## 2017-03-17 MED ORDER — NITROGLYCERIN 0.4 MG SL SUBL: 0.4000 mg | SUBLINGUAL_TABLET | SUBLINGUAL | Status: DC | PRN

## 2017-03-17 MED ORDER — SODIUM CHLORIDE 0.9 % IV SOLN
INTRAVENOUS | Status: DC
  Administered 2017-03-17 (×2): via INTRAVENOUS

## 2017-03-17 MED ORDER — PANTOPRAZOLE SODIUM 40 MG PO TBEC
40.0000 mg | DELAYED_RELEASE_TABLET | Freq: Once | ORAL | Status: AC
  Administered 2017-03-17: 40 mg via ORAL
  Filled 2017-03-17: qty 1

## 2017-03-17 MED ORDER — HYDRALAZINE HCL 20 MG/ML IJ SOLN: 10.0000 mg | INTRAMUSCULAR | Status: DC | PRN

## 2017-03-17 MED ORDER — AMLODIPINE BESYLATE 5 MG PO TABS
5.0000 mg | ORAL_TABLET | Freq: Every day | ORAL | Status: DC
  Administered 2017-03-17: 5 mg via ORAL
  Filled 2017-03-17: qty 1

## 2017-03-17 MED ORDER — ASPIRIN 81 MG PO CHEW: 324.0000 mg | CHEWABLE_TABLET | ORAL | Status: DC

## 2017-03-17 MED ORDER — ASPIRIN 81 MG PO CHEW
324.0000 mg | CHEWABLE_TABLET | Freq: Once | ORAL | Status: AC
  Administered 2017-03-17: 324 mg via ORAL
  Filled 2017-03-17: qty 4

## 2017-03-17 MED ORDER — TECHNETIUM TC 99M TETROFOSMIN IV KIT
30.0000 | PACK | Freq: Once | INTRAVENOUS | Status: AC | PRN
  Administered 2017-03-17: 31.944 via INTRAVENOUS

## 2017-03-17 MED ORDER — ACETAMINOPHEN 325 MG PO TABS: 650.0000 mg | ORAL_TABLET | ORAL | Status: DC | PRN

## 2017-03-17 MED ORDER — GABAPENTIN 100 MG PO CAPS: 100.0000 mg | ORAL_CAPSULE | Freq: Two times a day (BID) | ORAL | Status: DC

## 2017-03-17 MED ORDER — TECHNETIUM TC 99M TETROFOSMIN IV KIT
13.0000 | PACK | Freq: Once | INTRAVENOUS | Status: AC | PRN
  Administered 2017-03-17: 13.537 via INTRAVENOUS

## 2017-03-17 MED ORDER — DULOXETINE HCL 20 MG PO CPEP
20.0000 mg | ORAL_CAPSULE | Freq: Every day | ORAL | Status: DC
  Administered 2017-03-17: 20 mg via ORAL
  Filled 2017-03-17 (×2): qty 1

## 2017-03-17 MED ORDER — ASPIRIN 300 MG RE SUPP: 300.0000 mg | RECTAL | Status: DC

## 2017-03-17 MED ORDER — PROPRANOLOL HCL ER 80 MG PO CP24
80.0000 mg | ORAL_CAPSULE | Freq: Every day | ORAL | Status: DC
  Administered 2017-03-17: 80 mg via ORAL
  Filled 2017-03-17 (×2): qty 1

## 2017-03-17 MED ORDER — CLONAZEPAM 0.5 MG PO TABS: 0.5000 mg | ORAL_TABLET | Freq: Two times a day (BID) | ORAL | Status: DC | PRN

## 2017-03-17 MED ORDER — ASPIRIN EC 81 MG PO TBEC: 81.0000 mg | DELAYED_RELEASE_TABLET | Freq: Every day | ORAL | Status: DC

## 2017-03-17 NOTE — Progress Notes (Signed)
Cardiology Consultation:   Patient ID: Todd [name]; [id number]; 05-Feb-1931   Admit date: 03/17/2017 Date of Consult: 03/17/2017  Primary Care Provider: Guadalupe Maple, MD Primary Cardiologist: none Primary Electrophysiologist:  none   Patient Profile:   Todd [name] is a 83 y.o. male with a hx of reported small vessel CAD who is being seen today for the evaluation of chest pain at the request of Dr Estanislado Pandy.  History of Present Illness:   [name] is an elderly male who presented yesterday with chest pain. He is a difficult historian. States that his pain "just hurt." It has now resolved. There was no exertional component. There is no radiation to the neck or shoulders. He apparent underwent cardiac catheterization 8-10 years ago and was told he had a blockage too small to stent. He has no history of PCI or CABG.  He has already undergone echo and stress studies this morning.  Past Medical History:  Diagnosis Date  . Accelerated hypertension 03/04/2015  . Anxiety   . Aortic regurgitation   . Arthritis    osteoarthritis-left knee  . CAD (coronary artery disease)   . Depression   . Diabetes mellitus without complication ([place])   . ED (erectile dysfunction)   . GERD (gastroesophageal reflux disease)   . Glaucoma   . Hyperlipidemia   . Hypertension   . Iron deficiency anemia due to chronic blood loss   . Left heart failure ([place])   . Lumbago    Lumbosacral Neuritis  . Prostate cancer [facility])     Past Surgical History:  Procedure Laterality Date  . CARPAL TUNNEL RELEASE    . REPLACEMENT TOTAL KNEE BILATERAL       Home Medications:  Prior to Admission medications   Medication Sig Start Date End Date Taking? Authorizing Provider  aspirin EC 81 MG tablet Take 81 mg by mouth daily.   Yes [provider]  clonazePAM (KLONOPIN) 0.5 MG tablet Take 1 tablet (0.5 mg total) by mouth daily as needed for anxiety. 12/05/16  Yes Crissman, Jeannette How, MD  clotrimazole-betamethasone  (LOTRISONE) cream Apply 1 application topically 2 (two) times daily. 01/19/17  Yes Kathrine Haddock, NP  DULoxetine (CYMBALTA) 20 MG capsule Take 1 capsule (20 mg total) by mouth daily. 12/05/16  Yes Crissman, Jeannette How, MD  gabapentin (NEURONTIN) 100 MG capsule Take 100 mg by mouth 2 (two) times daily.    Yes [provider]  latanoprost (XALATAN) 0.005 % ophthalmic solution  06/08/16  Yes [provider]  metFORMIN (GLUCOPHAGE) 500 MG tablet Take 1 tablet (500 mg total) by mouth daily with breakfast. 01/09/17  Yes Johnson, Megan P, DO  nystatin (MYCOSTATIN/NYSTOP) powder Apply topically 4 (four) times daily. 01/09/17  Yes Johnson, Megan P, DO  propranolol ER (INDERAL LA) 80 MG 24 hr capsule Take 1 capsule (80 mg total) by mouth daily. 12/05/16  Yes Crissman, Jeannette How, MD  telmisartan (MICARDIS) 80 MG tablet Take 1 tablet (80 mg total) by mouth daily. 12/05/16  Yes Crissman, Jeannette How, MD  pantoprazole (PROTONIX) 40 MG tablet Take 1 tablet (40 mg total) by mouth daily. 03/17/17   Hillary Bow, MD    Inpatient Medications: Scheduled Meds: . aspirin  324 mg Oral NOW   Or  . aspirin  300 mg Rectal NOW  . [START ON 03/18/2017] aspirin EC  81 mg Oral Daily  . DULoxetine  20 mg Oral Daily  . enoxaparin (LOVENOX) injection  40 mg Subcutaneous Q24H  . gabapentin  100 mg Oral BID  . [START ON 03/18/2017] Influenza vac split quadrivalent PF  0.5 mL Intramuscular Tomorrow-1000  . irbesartan  75 mg Oral Daily  . latanoprost  1 drop Both Eyes QHS  . pantoprazole  40 mg Oral Once  . propranolol ER  80 mg Oral Daily   Continuous Infusions:  PRN Meds: acetaminophen, clonazePAM, hydrALAZINE, morphine injection, nitroGLYCERIN, ondansetron (ZOFRAN) IV  Allergies:    Allergies  Allergen Reactions  . Fluoxetine     headache  . Meloxicam Other (See Comments)    Other reaction(s): Dizziness Nervousness.     Social History:   Social History   Social History  . Marital status: Married    Spouse  name: N/A  . Number of children: N/A  . Years of education: N/A   Occupational History  . retired    Social History Main Topics  . Smoking status: Former Smoker    Quit date: 12/02/1994  . Smokeless tobacco: Never Used  . Alcohol use No  . Drug use: No  . Sexual activity: No   Other Topics Concern  . Not on file   Social History Narrative  . No narrative on file    Family History:    Family History  Problem Relation Age of Onset  . Stroke Mother   . Hypertension Mother   . Hypertension Father   . Stroke Sister   . Prostate cancer Neg Hx   . Kidney cancer Neg Hx   . Bladder Cancer Neg Hx      ROS:  Please see the history of present illness.  ROS  All other ROS reviewed and negative.     Physical Exam/Data:   Vitals:   03/17/17 0330 03/17/17 0400 03/17/17 0433 03/17/17 1209  BP: (!) 177/82 (!) 184/74 (!) 164/83 (!) 155/81  Pulse: (!) 59 64 (!) 34 61  Resp: 16 17  17   Temp:   97.6 F (36.4 C) 97.7 F (36.5 C)  TempSrc:   Oral Oral  SpO2: 97% 98% 96% 98%  Weight:   175 lb 6.4 oz (79.6 kg)   Height:   5\' 7"  (1.702 m)     Intake/Output Summary (Last 24 hours) at 03/17/17 1331 Last data filed at 03/17/17 1242  Gross per 24 hour  Intake            79.17 ml  Output              650 ml  Net          -570.83 ml   Filed Weights   03/17/17 0032 03/17/17 0433  Weight: 178 lb (80.7 kg) 175 lb 6.4 oz (79.6 kg)   Body mass index is 27.47 kg/m.  General:  Well nourished, well developed, pleasant elderly male in no acute distress HEENT: normal, delayed speech Lymph: no adenopathy Neck: no JVD Endocrine:  No thryomegaly Vascular: No carotid bruits; FA pulses 2+ bilaterally   Cardiac:  normal S1, S2; RRR; no murmur  Lungs:  clear to auscultation bilaterally, no wheezing, rhonchi or rales  Abd: soft, nontender, no hepatomegaly  Ext: no edema Musculoskeletal:  No deformities, BUE and BLE strength normal and equal Skin: warm and dry  Neuro:  CNs 2-12 intact, no  focal abnormalities noted Psych:  Normal affect   EKG:  The EKG was personally reviewed and demonstrates:   Telemetry:  Telemetry was personally reviewed and demonstrates:  *NSR, nonspecific ST change  Relevant CV Studies: Echo today is personally  reviewed. LV function is vigorous. There is 2+ aortic regurgitation. I do not appreciate any LV wall motion abnormalities and RV function appears normal. Formal echo interpretation is pending.  Myoview stress test:  Blood pressure demonstrated a normal response to exercise.  There was no ST segment deviation noted during stress.  The left ventricular ejection fraction is mildly decreased (45-54%).  The study is normal.  This is a low risk study.  Laboratory Data:  Chemistry Recent Labs Lab 03/17/17 0037 03/17/17 0619  NA 141 139  K 3.8 4.0  CL 110 107  CO2 23 25  GLUCOSE 143* 128*  BUN 20 20  CREATININE 0.88 1.00  CALCIUM 7.6* 8.9  GFRNONAA >60 >60  GFRAA >60 >60  ANIONGAP 8 7     Recent Labs Lab 03/17/17 0037  PROT 5.8*  ALBUMIN 3.4*  AST 60*  ALT 18  ALKPHOS 75  BILITOT 0.7   Hematology Recent Labs Lab 03/17/17 0037 03/17/17 0619  WBC 4.3 5.4  RBC 3.08* 3.34*  HGB 9.9* 10.7*  HCT 30.1* 32.3*  MCV 97.6 96.7  MCH 32.1 31.9  MCHC 32.9 33.0  RDW 13.5 13.6  PLT 180 193   Cardiac Enzymes Recent Labs Lab 03/17/17 0037 03/17/17 0619  TROPONINI 0.03* 0.04*   No results for input(s): TROPIPOC in the last 168 hours.  BNPNo results for input(s): BNP, PROBNP in the last 168 hours.  DDimer No results for input(s): DDIMER in the last 168 hours.  Radiology/Studies:  Nm Myocar Multi W/spect W/wall Motion / Ef  Result Date: 03/17/2017  Blood pressure demonstrated a normal response to exercise.  There was no ST segment deviation noted during stress.  The left ventricular ejection fraction is mildly decreased (45-54%).  The study is normal.  This is a low risk study.    Dg Chest Portable 1 View  Result  Date: 03/17/2017 CLINICAL DATA:  Chest pain, onset 23:00 while bathing. EXAM: PORTABLE CHEST 1 VIEW COMPARISON:  11/01/2016 FINDINGS: Moderate cardiomegaly, unchanged. The lungs are clear. The pulmonary vasculature is normal. No pleural effusion. Hilar and mediastinal contours are unremarkable and unchanged. Severe chronic right shoulder arthropathy. IMPRESSION: Stable cardiomegaly.  No consolidation or effusion. Electronically Signed   By: Andreas Newport M.D.   On: 03/17/2017 01:19    Assessment and Plan:   1. Chest pain at rest: Does not appear to be ischemic cardiac pain based on review of his serial cardiac markers (trop 0.03, 0.04), nuclear stress test, and echocardiogram.The patient is reassured regarding these findings.  2. HTN, uncontrolled: resume Micardis, inderal LA as outpatient. Add amlodipine for better BP control. Possible uncontrolled hypertension contributed to his symptoms.    For questions or updates, please contact [place] Please consult [email or web address] for contact info under Cardiology/STEMI.   Deatra James, MD  03/17/2017 1:31 PM

## 2017-03-17 NOTE — Progress Notes (Addendum)
Patient's L arm still continues to slightly bleed after application of 2 x 2 bandage and large Band-Aid after previous IV removal. Now applied a narrow roll of Kerlix gauze to area. Will apply a roll of Coban if problem persists. Wenda Low [facility]

## 2017-03-17 NOTE — ED Triage Notes (Signed)
Pt arrived via EMS from home with complaints of chest pain that started tonight around 11:00pm while patient was taking a bath. (constant substernal). Per EMS Pain was a 5 out of 10. Pt has had ASA and 1 1/2 inches of Nitro paste. Pt has a 20 gauge in the left AC. VS per EMS were BP-178/94 HR-54 O2 sat-100% RA. NO cardiac Hx but per EMS pt has frequent PVCs. NKA. Family in on the way to the hospital. Pt is smiling ans pleasant. He states that his pain is now a 4 out of 5.

## 2017-03-17 NOTE — ED Notes (Signed)
Date and time results received: 03/17/17 0115 (use smartphrase ".now" to insert current time)  Test: Troponin Critical Value: 0.03  Name of Provider Notified: Dr. Beather Arbour  Orders Received? Or Actions Taken?:

## 2017-03-17 NOTE — H&P (Signed)
[facility] [place] NAME: Todd [name]    MR#:  [id number]  DATE OF BIRTH:  01-21-1931  DATE OF ADMISSION:  03/17/2017  PRIMARY CARE PHYSICIAN: Todd Maple, MD   REQUESTING/REFERRING PHYSICIAN:   CHIEF COMPLAINT:   Chief Complaint  Patient presents with  . Chest Pain    HISTORY OF PRESENT ILLNESS: Todd [name]  is a 83 y.o. male with a known history of coronary artery disease, diabetes mellitus type 2, hyperlipidemia, hypertension GERD, prostate cancer presented to the emergency room with chest pain since 11 PM yesterday night.Patient noticed chest discomfort around 11 PM. Pain was located in the retrosternal area and sharp in nature 5 out of 10 on scale of 1-10. Pain was relieved with nitroglycerin and aspirin. No complaints of any shortness of breath, orthopnea. EKG normal sinus rhythm with no ST segment changes. Hospitalist service was consulted for further care.  PAST MEDICAL HISTORY:   Past Medical History:  Diagnosis Date  . Accelerated hypertension 03/04/2015  . Anxiety   . Aortic regurgitation   . Arthritis    osteoarthritis-left knee  . CAD (coronary artery disease)   . Depression   . Diabetes mellitus without complication ([place])   . ED (erectile dysfunction)   . GERD (gastroesophageal reflux disease)   . Glaucoma   . Hyperlipidemia   . Hypertension   . Iron deficiency anemia due to chronic blood loss   . Left heart failure ([place])   . Lumbago    Lumbosacral Neuritis  . Prostate cancer ([place])     PAST SURGICAL HISTORY: Past Surgical History:  Procedure Laterality Date  . CARPAL TUNNEL RELEASE    . REPLACEMENT TOTAL KNEE BILATERAL      SOCIAL HISTORY:  Social History  Substance Use Topics  . Smoking status: Former Smoker    Quit date: 12/02/1994  . Smokeless tobacco: Never Used  . Alcohol use No    FAMILY HISTORY:  Family History  Problem Relation Age of Onset  . Stroke Mother   . Hypertension  Mother   . Hypertension Father   . Stroke Sister   . Prostate cancer Neg Hx   . Kidney cancer Neg Hx   . Bladder Cancer Neg Hx     DRUG ALLERGIES:  Allergies  Allergen Reactions  . Fluoxetine     headache  . Meloxicam Other (See Comments)    Other reaction(s): Dizziness Nervousness.     REVIEW OF SYSTEMS:   CONSTITUTIONAL: No fever, fatigue or weakness.  EYES: No blurred or double vision.  EARS, NOSE, AND THROAT: No tinnitus or ear pain.  RESPIRATORY: No cough, shortness of breath, wheezing or hemoptysis.  CARDIOVASCULAR:Has chest pain,  No orthopnea, edema.  GASTROINTESTINAL: No nausea, vomiting, diarrhea or abdominal pain.  GENITOURINARY: No dysuria, hematuria.  ENDOCRINE: No polyuria, nocturia,  HEMATOLOGY: No anemia, easy bruising or bleeding SKIN: No rash or lesion. MUSCULOSKELETAL: No joint pain or arthritis.   NEUROLOGIC: No tingling, numbness, weakness.  PSYCHIATRY: No anxiety or depression.   MEDICATIONS AT HOME:  Prior to Admission medications   Medication Sig Start Date End Date Taking? Authorizing Provider  aspirin EC 81 MG tablet Take 81 mg by mouth daily.    [provider]  clonazePAM (KLONOPIN) 0.5 MG tablet Take 1 tablet (0.5 mg total) by mouth daily as needed for anxiety. 12/05/16   Todd Maple, MD  clotrimazole-betamethasone (LOTRISONE) cream Apply 1 application topically 2 (two) times daily.  01/19/17   Kathrine Haddock, NP  DULoxetine (CYMBALTA) 20 MG capsule Take 1 capsule (20 mg total) by mouth daily. 12/05/16   Todd Maple, MD  gabapentin (NEURONTIN) 100 MG capsule Take 100 mg by mouth 2 (two) times daily.     [provider]  latanoprost (XALATAN) 0.005 % ophthalmic solution  06/08/16   [provider]  metFORMIN (GLUCOPHAGE) 500 MG tablet Take 1 tablet (500 mg total) by mouth daily with breakfast. 01/09/17   Park Liter P, DO  nystatin (MYCOSTATIN/NYSTOP) powder Apply topically 4 (four) times daily. 01/09/17   Johnson,  Megan P, DO  propranolol ER (INDERAL LA) 80 MG 24 hr capsule Take 1 capsule (80 mg total) by mouth daily. 12/05/16   Todd Maple, MD  telmisartan (MICARDIS) 80 MG tablet Take 1 tablet (80 mg total) by mouth daily. 12/05/16   Todd Maple, MD      PHYSICAL EXAMINATION:   VITAL SIGNS: Blood pressure (!) 182/90, pulse 63, temperature 98 F (36.7 C), temperature source Oral, resp. rate 17, height 5\' 7"  (1.702 m), weight 80.7 kg (178 lb), SpO2 98 %.  GENERAL:  83 y.o.-year-old patient lying in the bed with no acute distress.  EYES: Pupils equal, round, reactive to light and accommodation. No scleral icterus. Extraocular muscles intact.  HEENT: Head atraumatic, normocephalic. Oropharynx and nasopharynx clear.  NECK:  Supple, no jugular venous distention. No thyroid enlargement, no tenderness.  LUNGS: Normal breath sounds bilaterally, no wheezing, rales,rhonchi or crepitation. No use of accessory muscles of respiration.  CARDIOVASCULAR: S1, S2 normal. No murmurs, rubs, or gallops.  ABDOMEN: Soft, nontender, nondistended. Bowel sounds present. No organomegaly or mass.  EXTREMITIES: No pedal edema, cyanosis, or clubbing.  NEUROLOGIC: Cranial nerves II through XII are intact. Muscle strength 5/5 in all extremities. Sensation intact. Gait not checked.  PSYCHIATRIC: The patient is alert and oriented x 3.  SKIN: No obvious rash, lesion, or ulcer.   LABORATORY PANEL:   CBC  Recent Labs Lab 03/17/17 0037  WBC 4.3  HGB 9.9*  HCT 30.1*  PLT 180  MCV 97.6  MCH 32.1  MCHC 32.9  RDW 13.5   ------------------------------------------------------------------------------------------------------------------  Chemistries   Recent Labs Lab 03/17/17 0037  NA 141  K 3.8  CL 110  CO2 23  GLUCOSE 143*  BUN 20  CREATININE 0.88  CALCIUM 7.6*  AST 60*  ALT 18  ALKPHOS 75  BILITOT 0.7    ------------------------------------------------------------------------------------------------------------------ estimated creatinine clearance is 63.5 mL/min (by C-G formula based on SCr of 0.88 mg/dL). ------------------------------------------------------------------------------------------------------------------ No results for input(s): TSH, T4TOTAL, T3FREE, THYROIDAB in the last 72 hours.  Invalid input(s): FREET3   Coagulation profile No results for input(s): INR, PROTIME in the last 168 hours. ------------------------------------------------------------------------------------------------------------------- No results for input(s): DDIMER in the last 72 hours. -------------------------------------------------------------------------------------------------------------------  Cardiac Enzymes  Recent Labs Lab 03/17/17 0037  TROPONINI 0.03*   ------------------------------------------------------------------------------------------------------------------ Invalid input(s): POCBNP  ---------------------------------------------------------------------------------------------------------------  Urinalysis    Component Value Date/Time   COLORURINE YELLOW 11/01/2016 1831   APPEARANCEUR Clear 01/09/2017 1359   LABSPEC 1.025 11/01/2016 1831   PHURINE 5.5 11/01/2016 1831   GLUCOSEU Negative 01/09/2017 1359   HGBUR NEGATIVE 11/01/2016 1831   BILIRUBINUR Negative 01/09/2017 1359   KETONESUR NEGATIVE 11/01/2016 1831   PROTEINUR Negative 01/09/2017 1359   PROTEINUR NEGATIVE 11/01/2016 1831   NITRITE Negative 01/09/2017 1359   NITRITE NEGATIVE 11/01/2016 1831   LEUKOCYTESUR Negative 01/09/2017 1359     RADIOLOGY: Dg Chest Portable 1 View  Result  Date: 03/17/2017 CLINICAL DATA:  Chest pain, onset 23:00 while bathing. EXAM: PORTABLE CHEST 1 VIEW COMPARISON:  11/01/2016 FINDINGS: Moderate cardiomegaly, unchanged. The lungs are clear. The pulmonary vasculature is normal. No  pleural effusion. Hilar and mediastinal contours are unremarkable and unchanged. Severe chronic right shoulder arthropathy. IMPRESSION: Stable cardiomegaly.  No consolidation or effusion. Electronically Signed   By: Andreas Newport M.D.   On: 03/17/2017 01:19    EKG: Orders placed or performed during the hospital encounter of 03/17/17  . EKG 12-Lead  . EKG 12-Lead    IMPRESSION AND PLAN: 83 year old elderly male patient with history of coronary artery disease, diabetes heart disease, hypertension, hyperlipidemia presented to the emergency room with chest pain.  Admitting diagnosis 1. Chest pain 2. Coronary artery disease 3. Type 2 diabetes mellitus 4. Accelerated Hypertension 5. Hyperlipidemia Treatment plan Admit patient to telemetry observation bed Aspirin 81 mg daily Cycle troponin and check echocardiogram Cardiology consultation Cardiac stress test Prn nitrates for chest pain  All the records are reviewed and case discussed with ED provider. Management plans discussed with the patient, family and they are in agreement.  CODE STATUS:FULL CODE Code Status History    Date Active Date Inactive Code Status Order ID Comments User Context   03/05/2015 12:19 AM 03/05/2015  3:07 PM Full Code [id number]  Lance Coon, MD Inpatient    Advance Directive Documentation     Most Recent Value  Type of Advance Directive  Living will  Pre-existing out of facility DNR order (yellow form or pink MOST form)  -  "MOST" Form in Place?  -       TOTAL TIME TAKING CARE OF THIS PATIENT: 50 minutes.    Saundra Shelling M.D on 03/17/2017 at 3:32 AM  Between 7am to 6pm - Pager - [phone]  After 6pm go to [email or web address] - password [email or web address] [facility]  Office  [phone]  CC: Primary care physician; Todd Maple, MD

## 2017-03-17 NOTE — Progress Notes (Signed)
Patient off unit for cardiac testing. Will resume care upon return. Todd [name] M Todd [name]  

## 2017-03-17 NOTE — ED Notes (Signed)
Pt transport to 249 

## 2017-03-17 NOTE — ED Notes (Signed)
Called to give nurse report, Network engineer said RN was unavailable at the moment. Will call back again in 20 minutes.

## 2017-03-17 NOTE — ED Provider Notes (Signed)
[facility] Emergency Department Provider Note   ____________________________________________   First MD Initiated Contact with Patient 03/17/17 0038     (approximate)  I have reviewed the triage vital signs and the nursing notes.   HISTORY  Chief Complaint Chest Pain    HPI Todd [name] is a 83 y.o. male brought to the ED from home via EMS with a chief complaint of chest pain.reports he was taking a bath around 11 PM when he fell onset of left-sided and substernal chest tightness. EMS gave aspirin and 1.5 inches of nitroglycerin paste. Patient and family report cardiac cath approximately 8-10 years ago which revealed blockage too small to stent. Symptoms associated with diaphoresis and shortness of breath. Denies associated nausea/vomiting, palpitations or dizziness. Denies recent fever, chills, abdominal pain, diarrhea. Denies recent travel or trauma. Interventions performed by EMS have relieved his pain.   Past Medical History:  Diagnosis Date  . Accelerated hypertension 03/04/2015  . Anxiety   . Aortic regurgitation   . Arthritis    osteoarthritis-left knee  . CAD (coronary artery disease)   . Depression   . Diabetes mellitus without complication ([place])   . ED (erectile dysfunction)   . GERD (gastroesophageal reflux disease)   . Glaucoma   . Hyperlipidemia   . Hypertension   . Iron deficiency anemia due to chronic blood loss   . Left heart failure ([place])   . Lumbago    Lumbosacral Neuritis  . Prostate cancer [facility])     Patient Active Problem List   Diagnosis Date Noted  . 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 Urinary incontinence 04/27/2015  . Anxiety, generalized 04/07/2015  . History of BCG vaccination 04/07/2015  . GERD (gastroesophageal reflux disease) 03/04/2015  . Anxiety 03/04/2015  . Fatigue 03/01/2015  . Depression 02/25/2015  . Degeneration of intervertebral disc of lumbar region 02/23/2015  . Neuritis or  radiculitis due to rupture of lumbar intervertebral disc 02/23/2015  . Lumbar canal stenosis 02/23/2015  . Hypertension 12/02/2014  . Diabetes mellitus without complication ([place]) 72/02/4708  . Benign essential tremor 03/13/2014  . Imbalance 03/13/2014  . CA of prostate ([place]) 10/07/2012  . Malignant neoplasm of prostate ([place]) 10/07/2012  . ED (erectile dysfunction) of organic origin 02/09/2012    Past Surgical History:  Procedure Laterality Date  . CARPAL TUNNEL RELEASE    . REPLACEMENT TOTAL KNEE BILATERAL      Prior to Admission medications   Medication Sig Start Date End Date Taking? Authorizing Provider  aspirin EC 81 MG tablet Take 81 mg by mouth daily.    [provider]  clonazePAM (KLONOPIN) 0.5 MG tablet Take 1 tablet (0.5 mg total) by mouth daily as needed for anxiety. 12/05/16   Guadalupe Maple, MD  clotrimazole-betamethasone (LOTRISONE) cream Apply 1 application topically 2 (two) times daily. 01/19/17   Kathrine Haddock, NP  DULoxetine (CYMBALTA) 20 MG capsule Take 1 capsule (20 mg total) by mouth daily. 12/05/16   Guadalupe Maple, MD  gabapentin (NEURONTIN) 100 MG capsule Take 100 mg by mouth 2 (two) times daily.     [provider]  latanoprost (XALATAN) 0.005 % ophthalmic solution  06/08/16   [provider]  metFORMIN (GLUCOPHAGE) 500 MG tablet Take 1 tablet (500 mg total) by mouth daily with breakfast. 01/09/17   Park Liter P, DO  nystatin (MYCOSTATIN/NYSTOP) powder Apply topically 4 (four) times daily. 01/09/17   Johnson, Megan P, DO  propranolol ER (INDERAL LA) 80 MG  24 hr capsule Take 1 capsule (80 mg total) by mouth daily. 12/05/16   Guadalupe Maple, MD  telmisartan (MICARDIS) 80 MG tablet Take 1 tablet (80 mg total) by mouth daily. 12/05/16   Guadalupe Maple, MD    Allergies Fluoxetine and Meloxicam  Family History  Problem Relation Age of Onset  . Stroke Mother   . Hypertension Mother   . Hypertension Father   . Stroke Sister   .  Prostate cancer Neg Hx   . Kidney cancer Neg Hx   . Bladder Cancer Neg Hx     Social History Social History  Substance Use Topics  . Smoking status: Former Smoker    Quit date: 12/02/1994  . Smokeless tobacco: Never Used  . Alcohol use No    Review of Systems  Constitutional: No fever/chills. Eyes: No visual changes. ENT: No sore throat. Cardiovascular: positive for chest pain. Respiratory: positive for shortness of breath. Gastrointestinal: No abdominal pain.  positive for nausea, no vomiting.  No diarrhea.  No constipation. Genitourinary: Negative for dysuria. Musculoskeletal: Negative for back pain. Skin: Negative for rash. Neurological: Negative for headaches, focal weakness or numbness.   ____________________________________________   PHYSICAL EXAM:  VITAL SIGNS: ED Triage Vitals  Enc Vitals Group     BP 03/17/17 0034 (!) 173/69     Pulse Rate 03/17/17 0034 65     Resp 03/17/17 0034 18     Temp 03/17/17 0034 98 F (36.7 C)     Temp Source 03/17/17 0034 Oral     SpO2 03/17/17 0034 100 %     Weight 03/17/17 0032 178 lb (80.7 kg)     Height 03/17/17 0032 5\' 7"  (1.702 m)     Head Circumference --      Peak Flow --      Pain Score 03/17/17 0047 4     Pain Loc --      Pain Edu? --      Excl. in [place]? --     Constitutional: Alert and oriented. Well appearing and in no acute distress. Eyes: Conjunctivae are normal. PERRL. EOMI. Head: Atraumatic. Nose: No congestion/rhinnorhea. Mouth/Throat: Mucous membranes are moist.  Oropharynx non-erythematous. Neck: No stridor.  No carotid bruits. Cardiovascular: Normal rate, regular rhythm. Grossly normal heart sounds.  Good peripheral circulation. Respiratory: Normal respiratory effort.  No retractions. Lungs CTAB. Gastrointestinal: Soft and nontender. No distention. No abdominal bruits. No CVA tenderness. Musculoskeletal: No lower extremity tenderness nor edema.  No joint effusions. Neurologic:  Normal speech and  language. No gross focal neurologic deficits are appreciated.  Skin:  Skin is warm, dry and intact. No rash noted. Psychiatric: Mood and affect are normal. Speech and behavior are normal.  ____________________________________________   LABS (all labs ordered are listed, but only abnormal results are displayed)  Labs Reviewed  CBC - Abnormal; Notable for the following:       Result Value   RBC 3.08 (*)    Hemoglobin 9.9 (*)    HCT 30.1 (*)    All other components within normal limits  COMPREHENSIVE METABOLIC PANEL - Abnormal; Notable for the following:    Glucose, Bld 143 (*)    Calcium 7.6 (*)    Total Protein 5.8 (*)    Albumin 3.4 (*)    AST 60 (*)    All other components within normal limits  TROPONIN I - Abnormal; Notable for the following:    Troponin I 0.03 (*)    All other components within  normal limits   ____________________________________________  EKG  ED ECG REPORT I, Carlous Olivares J, the attending physician, personally viewed and interpreted this ECG.   Date: 03/17/2017  EKG Time: 0033  Rate: 72  Rhythm: normal EKG, normal sinus rhythm  Axis: LAD  Intervals:PVCs  ST&T Change: nonspecific  ____________________________________________  RADIOLOGY  Dg Chest Portable 1 View  Result Date: 03/17/2017 CLINICAL DATA:  Chest pain, onset 23:00 while bathing. EXAM: PORTABLE CHEST 1 VIEW COMPARISON:  11/01/2016 FINDINGS: Moderate cardiomegaly, unchanged. The lungs are clear. The pulmonary vasculature is normal. No pleural effusion. Hilar and mediastinal contours are unremarkable and unchanged. Severe chronic right shoulder arthropathy. IMPRESSION: Stable cardiomegaly.  No consolidation or effusion. Electronically Signed   By: Andreas Newport M.D.   On: 03/17/2017 01:19    ____________________________________________   PROCEDURES  Procedure(s) performed: None  Procedures  Critical Care performed: Yes, see critical care note(s)   CRITICAL CARE Performed by:  Paulette Blanch   Total critical care time: 30 minutes  Critical care time was exclusive of separately billable procedures and treating other patients.  Critical care was necessary to treat or prevent imminent or life-threatening deterioration.  Critical care was time spent personally by me on the following activities: development of treatment plan with patient and/or surrogate as well as nursing, discussions with consultants, evaluation of patient's response to treatment, examination of patient, obtaining history from patient or surrogate, ordering and performing treatments and interventions, ordering and review of laboratory studies, ordering and review of radiographic studies, pulse oximetry and re-evaluation of patient's condition.  ____________________________________________   INITIAL IMPRESSION / ASSESSMENT AND PLAN / ED COURSE  As part of my medical decision making, I reviewed the following data within the [place] History obtained from family, Labs reviewed, EKG interpreted, Radiograph reviewed, Discussed with admitting physician and Notes from prior ED visits.   83 year old male with hypertension, diabetes, GERD, CAD who presents with chest pain concerning for unstable angina. Differential diagnosis includes, but is not limited to, ACS, aortic dissection, pulmonary embolism, cardiac tamponade, pneumothorax, pneumonia, pericarditis/myocarditis, GI-related causes including esophagitis/gastritis, and musculoskeletal chest wall pain. Pain-free after aspirin and nitroglycerin paste. Initial troponin 0.03. Will discuss with hospitalist evaluate patient in the emergency department for admission.      ____________________________________________   FINAL CLINICAL IMPRESSION(S) / ED DIAGNOSES  Final diagnoses:  Chest pain, unspecified type  Elevated troponin  Essential hypertension      NEW MEDICATIONS STARTED DURING THIS VISIT:  New Prescriptions   No  medications on file     Note:  This document was prepared using Dragon voice recognition software and may include unintentional dictation errors.    Paulette Blanch, MD 03/17/17 [phone]

## 2017-03-17 NOTE — Discharge Instructions (Signed)

## 2017-03-19 ENCOUNTER — Telehealth: Payer: Self-pay

## 2017-03-19 NOTE — Telephone Encounter (Signed)
I have made the 2nd attempt to contact the patient or family member in charge, in order to follow up from recently being discharged from the hospital. I left a message on voicemail but I will make another attempt at a different time.  

## 2017-03-19 NOTE — Telephone Encounter (Signed)
I have made the 1st attempt to contact the patient or family member in charge, in order to follow up from recently being discharged from the hospital. I left a message on voicemail but I will make another attempt at a different time.  

## 2017-03-20 DIAGNOSIS — Z96652 Presence of left artificial knee joint: Secondary | ICD-10-CM | POA: Diagnosis not present

## 2017-03-20 DIAGNOSIS — G8929 Other chronic pain: Secondary | ICD-10-CM | POA: Diagnosis not present

## 2017-03-20 DIAGNOSIS — M47816 Spondylosis without myelopathy or radiculopathy, lumbar region: Secondary | ICD-10-CM | POA: Diagnosis not present

## 2017-03-20 DIAGNOSIS — M545 Low back pain: Secondary | ICD-10-CM | POA: Diagnosis not present

## 2017-03-20 NOTE — Discharge Summary (Signed)
[facility] [place] NAME: Todd [name]    MR#:  [id number]  DATE OF BIRTH:  03/29/1931  DATE OF ADMISSION:  03/17/2017 ADMITTING PHYSICIAN: Saundra Shelling, MD  DATE OF DISCHARGE: 03/17/2017  5:08 PM  PRIMARY CARE PHYSICIAN: Guadalupe Maple, MD   ADMISSION DIAGNOSIS:  Elevated troponin [R74.8] Essential hypertension [I10] Chest pain, unspecified type [R07.9]  DISCHARGE DIAGNOSIS:  Active Problems:   Chest pain   SECONDARY DIAGNOSIS:   Past Medical History:  Diagnosis Date  . Accelerated hypertension 03/04/2015  . Anxiety   . Aortic regurgitation   . Arthritis    osteoarthritis-left knee  . CAD (coronary artery disease)   . Depression   . Diabetes mellitus without complication ([place])   . ED (erectile dysfunction)   . GERD (gastroesophageal reflux disease)   . Glaucoma   . Hyperlipidemia   . Hypertension   . Iron deficiency anemia due to chronic blood loss   . Left heart failure ([place])   . Lumbago    Lumbosacral Neuritis  . Prostate cancer [facility])      ADMITTING HISTORY  HISTORY OF PRESENT ILLNESS: Todd [name]  is a 83 y.o. male with a known history of coronary artery disease, diabetes mellitus type 2, hyperlipidemia, hypertension GERD, prostate cancer presented to the emergency room with chest pain since 11 PM yesterday night.Patient noticed chest discomfort around 11 PM. Pain was located in the retrosternal area and sharp in nature 5 out of 10 on scale of 1-10. Pain was relieved with nitroglycerin and aspirin. No complaints of any shortness of breath, orthopnea. EKG normal sinus rhythm with no ST segment changes. Hospitalist service was consulted for further care.  HOSPITAL COURSE:   * Chest pain, atypical. Patient was admitted to telemetry floor. Troponins remained negative. No EKG changes. No arrhythmias on telemetry. His chest pain resolved. Patient had a stress test done which was negative. Patient's symptoms are thought to  be likely due to GERD. Started on PPI. Seen by cardiology. Patient being discharged home in stable condition.  CONSULTS OBTAINED:  Treatment Team:  Sherren Mocha, MD  DRUG ALLERGIES:   Allergies  Allergen Reactions  . Fluoxetine     headache  . Meloxicam Other (See Comments)    Other reaction(s): Dizziness Nervousness.     DISCHARGE MEDICATIONS:   Discharge Medication List as of 03/17/2017  2:23 PM    START taking these medications   Details  pantoprazole (PROTONIX) 40 MG tablet Take 1 tablet (40 mg total) by mouth daily., Starting Sat 03/17/2017, Normal      CONTINUE these medications which have NOT CHANGED   Details  aspirin EC 81 MG tablet Take 81 mg by mouth daily., Historical Med    clonazePAM (KLONOPIN) 0.5 MG tablet Take 1 tablet (0.5 mg total) by mouth daily as needed for anxiety., Starting Tue 12/05/2016, Print    clotrimazole-betamethasone (LOTRISONE) cream Apply 1 application topically 2 (two) times daily., Starting Fri 01/19/2017, Normal    DULoxetine (CYMBALTA) 20 MG capsule Take 1 capsule (20 mg total) by mouth daily., Starting Tue 12/05/2016, Normal    gabapentin (NEURONTIN) 100 MG capsule Take 100 mg by mouth 2 (two) times daily. , Historical Med    latanoprost (XALATAN) 0.005 % ophthalmic solution Starting Thu 06/08/2016, Historical Med    metFORMIN (GLUCOPHAGE) 500 MG tablet Take 1 tablet (500 mg total) by mouth daily with breakfast., Starting Tue 01/09/2017, No Print    nystatin (MYCOSTATIN/NYSTOP) powder Apply  topically 4 (four) times daily., Starting Tue 01/09/2017, Normal    propranolol ER (INDERAL LA) 80 MG 24 hr capsule Take 1 capsule (80 mg total) by mouth daily., Starting Tue 12/05/2016, Normal    telmisartan (MICARDIS) 80 MG tablet Take 1 tablet (80 mg total) by mouth daily., Starting Tue 12/05/2016, Normal        Today   VITAL SIGNS:  Blood pressure (!) 155/81, pulse 61, temperature 97.7 F (36.5 C), temperature source Oral, resp. rate 17, height  5\' 7"  (1.702 m), weight 79.6 kg (175 lb 6.4 oz), SpO2 98 %.  I/O:  No intake or output data in the 24 hours ending 03/20/17 1309  PHYSICAL EXAMINATION:  Physical Exam  GENERAL:  83 y.o.-year-old patient lying in the bed with no acute distress.  LUNGS: Normal breath sounds bilaterally, no wheezing, rales,rhonchi or crepitation. No use of accessory muscles of respiration.  CARDIOVASCULAR: S1, S2 normal. No murmurs, rubs, or gallops.  ABDOMEN: Soft, non-tender, non-distended. Bowel sounds present. No organomegaly or mass.  NEUROLOGIC: Moves all 4 extremities. PSYCHIATRIC: The patient is alert and oriented x 3.  SKIN: No obvious rash, lesion, or ulcer.   DATA REVIEW:   CBC  Recent Labs Lab 03/17/17 0619  WBC 5.4  HGB 10.7*  HCT 32.3*  PLT 193    Chemistries   Recent Labs Lab 03/17/17 0037 03/17/17 0619  NA 141 139  K 3.8 4.0  CL 110 107  CO2 23 25  GLUCOSE 143* 128*  BUN 20 20  CREATININE 0.88 1.00  CALCIUM 7.6* 8.9  AST 60*  --   ALT 18  --   ALKPHOS 75  --   BILITOT 0.7  --     Cardiac Enzymes  Recent Labs Lab 03/17/17 0619  TROPONINI 0.04*    Microbiology Results  Results for orders placed or performed in visit on 01/09/17  Microscopic Examination     Status: Abnormal   Collection Time: 01/09/17  1:59 PM  Result Value Ref Range Status   WBC, UA 0-5 0 - 5 /hpf Final   RBC, UA 0-2 0 - 2 /hpf Final   Epithelial Cells (non renal) 0-10 0 - 10 /hpf Final   Renal Epithel, UA 0-10 (A) None seen /hpf Final   Mucus, UA Present (A) Not Estab. Final   Bacteria, UA None seen None seen/Few Final    RADIOLOGY:  No results found.  Follow up with PCP in 1 week.  Management plans discussed with the patient, family and they are in agreement.  CODE STATUS:  Code Status History    Date Active Date Inactive Code Status Order ID Comments User Context   03/17/2017  4:19 AM 03/17/2017  9:09 PM Full Code [id number]  Saundra Shelling, MD ED   03/05/2015 12:19 AM  03/05/2015  3:07 PM Full Code [id number]  Lance Coon, MD Inpatient    Advance Directive Documentation     Most Recent Value  Type of Advance Directive  Living will  Pre-existing out of facility DNR order (yellow form or pink MOST form)  -  "MOST" Form in Place?  -      TOTAL TIME TAKING CARE OF THIS PATIENT ON DAY OF DISCHARGE: more than 30 minutes.   Hillary Bow R M.D on 03/20/2017 at 1:09 PM  Between 7am to 6pm - Pager - [phone]  After 6pm go to [email or web address] - password [email or web address] [facility]  Office  [phone]  CC: Primary care physician; Jeananne Rama,  Jeannette How, MD  Note: This dictation was prepared with Dragon dictation along with smaller phrase technology. Any transcriptional errors that result from this process are unintentional.

## 2017-03-26 ENCOUNTER — Ambulatory Visit (INDEPENDENT_AMBULATORY_CARE_PROVIDER_SITE_OTHER): Payer: PPO | Admitting: Family Medicine

## 2017-03-26 ENCOUNTER — Encounter: Payer: Self-pay | Admitting: Family Medicine

## 2017-03-26 ENCOUNTER — Other Ambulatory Visit: Payer: Self-pay | Admitting: Orthopedic Surgery

## 2017-03-26 VITALS — BP 203/81 | HR 67 | Wt 180.0 lb

## 2017-03-26 DIAGNOSIS — G250 Essential tremor: Secondary | ICD-10-CM | POA: Diagnosis not present

## 2017-03-26 DIAGNOSIS — E119 Type 2 diabetes mellitus without complications: Secondary | ICD-10-CM

## 2017-03-26 DIAGNOSIS — I10 Essential (primary) hypertension: Secondary | ICD-10-CM | POA: Diagnosis not present

## 2017-03-26 DIAGNOSIS — M5442 Lumbago with sciatica, left side: Principal | ICD-10-CM

## 2017-03-26 DIAGNOSIS — G8929 Other chronic pain: Secondary | ICD-10-CM

## 2017-03-26 LAB — BAYER DCA HB A1C WAIVED: HB A1C (BAYER DCA - WAIVED): 5.5 % (ref <7.0)

## 2017-03-26 NOTE — Assessment & Plan Note (Signed)
The current medical regimen is effective;  continue present plan and medications.  

## 2017-03-26 NOTE — Progress Notes (Signed)
BP (!) 203/81   Pulse 67   Wt 180 lb (81.6 kg)   SpO2 98%   BMI 28.19 kg/m    Subjective:    Patient ID: Todd [name], male    DOB: Sep 18, 1930, 83 y.o.   MRN: [id number]  HPI: Todd [name] is a 83 y.o. male  Chief Complaint  Patient presents with  . Follow-up   follow-up accompanied by daughter has no further chest pain since hospitalization. Patient otherwise doing well anxiety nerves doing okay as bothered by neuropathy symptoms and back pain symptoms. 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 Patient pending probable MRI and consideration of injections. No complaints from diabetes with good control no low blood sugar spells or issues.  Relevant past medical, surgical, family and social history reviewed and updated as indicated. Interim medical history since our last visit reviewed. Allergies and medications reviewed and updated.  Review of Systems  Constitutional: Negative.   Respiratory: Negative.   Cardiovascular: Negative.     Per HPI unless specifically indicated above     Objective:    BP (!) 203/81   Pulse 67   Wt 180 lb (81.6 kg)   SpO2 98%   BMI 28.19 kg/m   Wt Readings from Last 3 Encounters:  03/26/17 180 lb (81.6 kg)  03/17/17 175 lb 6.4 oz (79.6 kg)  03/15/17 176 lb 8 oz (80.1 kg)    Physical Exam  Constitutional: He is oriented to person, place, and time. He appears well-developed and well-nourished.  HENT:  Head: Normocephalic and atraumatic.  Eyes: Conjunctivae and EOM are normal.  Neck: Normal range of motion.  Cardiovascular: Normal rate, regular rhythm and normal heart sounds.   Pulmonary/Chest: Effort normal and breath sounds normal.  Musculoskeletal: Normal range of motion.  Neurological: He is alert and oriented to person, place, and time.  Skin: No erythema.  Psychiatric: He has a normal mood and affect. His behavior is normal. Judgment and  thought content normal.    Results for orders placed or performed during the hospital encounter of 03/17/17  NM Myocar Multi W/Spect W/Wall Motion / EF  Result Value Ref Range   SSS 4    SRS 6    SDS 0    TID 0.89    LV sys vol 46 mL   LV dias vol 94 62 - 150 mL  CBC  Result Value Ref Range   WBC 4.3 3.8 - 10.6 K/uL   RBC 3.08 (L) 4.40 - 5.90 MIL/uL   Hemoglobin 9.9 (L) 13.0 - 18.0 g/dL   HCT 30.1 (L) 40.0 - 52.0 %   MCV 97.6 80.0 - 100.0 fL   MCH 32.1 26.0 - 34.0 pg   MCHC 32.9 32.0 - 36.0 g/dL   RDW 13.5 11.5 - 14.5 %   Platelets 180 150 - 440 K/uL  Comprehensive metabolic panel  Result Value Ref Range   Sodium 141 135 - 145 mmol/L   Potassium 3.8 3.5 - 5.1 mmol/L   Chloride 110 101 - 111 mmol/L   CO2 23 22 - 32 mmol/L   Glucose, Bld 143 (H) 65 - 99 mg/dL   BUN 20 6 - 20 mg/dL   Creatinine, Ser 0.88 0.61 - 1.24 mg/dL   Calcium 7.6 (L) 8.9 - 10.3 mg/dL   Total Protein 5.8 (L) 6.5 - 8.1 g/dL   Albumin 3.4 (L) 3.5 - 5.0 g/dL   AST  60 (H) 15 - 41 U/L   ALT 18 17 - 63 U/L   Alkaline Phosphatase 75 38 - 126 U/L   Total Bilirubin 0.7 0.3 - 1.2 mg/dL   GFR calc non Af Amer >60 >60 mL/min   GFR calc Af Amer >60 >60 mL/min   Anion gap 8 5 - 15  Troponin I  Result Value Ref Range   Troponin I 0.03 (HH) <0.03 ng/mL  Troponin I  Result Value Ref Range   Troponin I 0.04 (HH) <0.03 ng/mL  Basic metabolic panel  Result Value Ref Range   Sodium 139 135 - 145 mmol/L   Potassium 4.0 3.5 - 5.1 mmol/L   Chloride 107 101 - 111 mmol/L   CO2 25 22 - 32 mmol/L   Glucose, Bld 128 (H) 65 - 99 mg/dL   BUN 20 6 - 20 mg/dL   Creatinine, Ser 1.00 0.61 - 1.24 mg/dL   Calcium 8.9 8.9 - 10.3 mg/dL   GFR calc non Af Amer >60 >60 mL/min   GFR calc Af Amer >60 >60 mL/min   Anion gap 7 5 - 15  Lipid panel  Result Value Ref Range   Cholesterol 163 0 - 200 mg/dL   Triglycerides 74 <150 mg/dL   HDL 42 >40 mg/dL   Total CHOL/HDL Ratio 3.9 RATIO   VLDL 15 0 - 40 mg/dL   LDL Cholesterol 106  (H) 0 - 99 mg/dL  CBC  Result Value Ref Range   WBC 5.4 3.8 - 10.6 K/uL   RBC 3.34 (L) 4.40 - 5.90 MIL/uL   Hemoglobin 10.7 (L) 13.0 - 18.0 g/dL   HCT 32.3 (L) 40.0 - 52.0 %   MCV 96.7 80.0 - 100.0 fL   MCH 31.9 26.0 - 34.0 pg   MCHC 33.0 32.0 - 36.0 g/dL   RDW 13.6 11.5 - 14.5 %   Platelets 193 150 - 440 K/uL  ECHOCARDIOGRAM COMPLETE  Result Value Ref Range   Weight 2,806.4 oz   Height 67 in   BP 164/83 mmHg   LV PW d 14.5 (A) 0.6 - 1.1 mm   FS 27 (A) 28 - 44 %   LA ID, A-P, ES 42 mm   IVS/LV PW RATIO, ED .9    LV e' LATERAL 4.38 cm/s   LV E/e' medial 21.32    LV E/e'average 21.32    LA diam index 2.15 cm/m2   LA vol A4C 49.5 ml   P 1/2 time 734 ms   Area-P 1/2 2.34 cm2   E decel time 320 msec   Peak grad 3 mmHg   E/e' ratio 21.32    MV pk E vel 93.4 m/s   P 1/2 time 94 ms   MV pk A vel 106 m/s   MV Dec 320    LA diam end sys 42.00 mm   TDI e' medial 3.68    TDI e' lateral 4.38    Lateral S' vel 14.20 cm/sec   TAPSE 29.50 mm      Assessment & Plan:   Problem List Items Addressed This Visit      Cardiovascular and Mediastinum   Hypertension - Primary    The current medical regimen is effective;  continue present plan and medications.       Relevant Orders   Bayer DCA Hb A1c Waived     Endocrine   Diabetes mellitus without complication ([place])    The current medical regimen is effective;  continue  present plan and medications.       Relevant Orders   Bayer DCA Hb A1c Waived     Nervous and Auditory   Benign essential tremor    The current medical regimen is effective;  continue present plan and medications.           Follow up plan: Return in about 3 months (around 06/26/2017) for BMP,  Lipids, ALT, AST, Hemoglobin A1c.

## 2017-03-27 DIAGNOSIS — R262 Difficulty in walking, not elsewhere classified: Secondary | ICD-10-CM | POA: Diagnosis not present

## 2017-03-27 DIAGNOSIS — M25561 Pain in right knee: Secondary | ICD-10-CM | POA: Diagnosis not present

## 2017-03-27 DIAGNOSIS — M25562 Pain in left knee: Secondary | ICD-10-CM | POA: Diagnosis not present

## 2017-03-29 DIAGNOSIS — R262 Difficulty in walking, not elsewhere classified: Secondary | ICD-10-CM | POA: Diagnosis not present

## 2017-03-29 DIAGNOSIS — M25562 Pain in left knee: Secondary | ICD-10-CM | POA: Diagnosis not present

## 2017-03-29 DIAGNOSIS — M25561 Pain in right knee: Secondary | ICD-10-CM | POA: Diagnosis not present

## 2017-04-05 DIAGNOSIS — M25561 Pain in right knee: Secondary | ICD-10-CM | POA: Diagnosis not present

## 2017-04-05 DIAGNOSIS — R262 Difficulty in walking, not elsewhere classified: Secondary | ICD-10-CM | POA: Diagnosis not present

## 2017-04-05 DIAGNOSIS — M25562 Pain in left knee: Secondary | ICD-10-CM | POA: Diagnosis not present

## 2017-04-06 ENCOUNTER — Ambulatory Visit: Payer: PPO

## 2017-04-10 DIAGNOSIS — M25562 Pain in left knee: Secondary | ICD-10-CM | POA: Diagnosis not present

## 2017-04-10 DIAGNOSIS — R262 Difficulty in walking, not elsewhere classified: Secondary | ICD-10-CM | POA: Diagnosis not present

## 2017-04-10 DIAGNOSIS — M25561 Pain in right knee: Secondary | ICD-10-CM | POA: Diagnosis not present

## 2017-04-11 ENCOUNTER — Ambulatory Visit
Admission: RE | Admit: 2017-04-11 | Discharge: 2017-04-11 | Disposition: A | Discharge status: Home / Self Care | Payer: PPO | Source: Ambulatory Visit | Attending: Orthopedic Surgery | Admitting: Orthopedic Surgery

## 2017-04-11 DIAGNOSIS — M48061 Spinal stenosis, lumbar region without neurogenic claudication: Secondary | ICD-10-CM | POA: Insufficient documentation

## 2017-04-11 DIAGNOSIS — M5442 Lumbago with sciatica, left side: Secondary | ICD-10-CM

## 2017-04-11 DIAGNOSIS — G8929 Other chronic pain: Secondary | ICD-10-CM | POA: Diagnosis not present

## 2017-04-11 DIAGNOSIS — M545 Low back pain: Secondary | ICD-10-CM | POA: Diagnosis not present

## 2017-04-20 DIAGNOSIS — M48062 Spinal stenosis, lumbar region with neurogenic claudication: Secondary | ICD-10-CM | POA: Diagnosis not present

## 2017-04-23 NOTE — Progress Notes (Signed)
Cardiology Office Note  Date:  04/24/2017   ID:  Todd [name], DOB Sep 09, 1930, MRN [id number]  PCP:  Guadalupe Maple, MD   Chief Complaint  Patient presents with  . OTHER    F/u ED due to chest pain. Meds reviewed verbally with pt.    HPI:  [name] is a 83 year old gentleman with past medical history of Coronary artery disease by catheterization 8-10 years ago Diabetes Hypertension Tremor GERD Who presents to establish care in the office after recent hospitalization for chest pain  Poor historian presents with daughter,   hospital March 17, 2017 with chest pain. Reported having central chest pain with no radiation He had echocardiogram and stress test in the hospital Blood pressure markedly elevated in the hospital  Pharmacologic Myoview March 17, 2017 Results reviewed with him in detail today Blood pressure demonstrated a normal response to exercise.  There was no ST segment deviation noted during stress.  The left ventricular ejection fraction is mildly decreased (45-54%).  The study is normal.  This is a low risk study.    In follow-up today he presents with a wheelchairEchocardiogram March 17, 2017 with normal LV function, mild to moderate aortic valve regurgitation, mild MR  In follow-up today he presents in a wheelchair Daughter reports that he lives with other family members Does not check his blood pressure at home Denies any further chest pain on his current medication regimen Was started on PPI in the hospital Was on this for many years, stopped it would seem on his own in the past Was also previously on a statin, previously on other blood pressure medications including amlodipine.  No longer taking these   Shows EKG personally reviewed by myself on todays visit Normal sinus rhythm rate 62 bpm left axis deviation   PMH:   has a past medical history of Accelerated hypertension (03/04/2015), Anxiety, Aortic regurgitation, Arthritis, CAD (coronary  artery disease), Depression, Diabetes mellitus without complication ([place]), ED (erectile dysfunction), GERD (gastroesophageal reflux disease), Glaucoma, Hyperlipidemia, Hypertension, Iron deficiency anemia due to chronic blood loss, Left heart failure ([place]), Lumbago, and Prostate cancer ([place]).  PSH:    Past Surgical History:  Procedure Laterality Date  . CARPAL TUNNEL RELEASE    . REPLACEMENT TOTAL KNEE BILATERAL      Current Outpatient Medications  Medication Sig Dispense Refill  . aspirin EC 81 MG tablet Take 81 mg by mouth daily.    . busPIRone (BUSPAR) 10 MG tablet Take 10 mg by mouth daily.     . clonazePAM (KLONOPIN) 0.5 MG tablet Take 1 tablet (0.5 mg total) by mouth daily as needed for anxiety. 30 tablet 5  . DULoxetine (CYMBALTA) 20 MG capsule Take 1 capsule (20 mg total) by mouth daily. 90 capsule 4  . gabapentin (NEURONTIN) 100 MG capsule Take 100 mg by mouth 2 (two) times daily.     [name] Kitchen latanoprost (XALATAN) 0.005 % ophthalmic solution     . metFORMIN (GLUCOPHAGE) 500 MG tablet Take 1 tablet (500 mg total) by mouth daily with breakfast. 180 tablet 4  . pantoprazole (PROTONIX) 40 MG tablet Take 1 tablet (40 mg total) by mouth daily. 90 tablet 3  . propranolol ER (INDERAL LA) 80 MG 24 hr capsule Take 1 capsule (80 mg total) by mouth daily. 90 capsule 4  . telmisartan (MICARDIS) 80 MG tablet Take 1 tablet (80 mg total) by mouth daily. 90 tablet 4  . amLODipine (NORVASC) 10 MG tablet Take 1 tablet (10 mg total)  by mouth daily. 30 tablet 11  . atorvastatin (LIPITOR) 10 MG tablet Take 1 tablet (10 mg total) by mouth daily. 90 tablet 3   No current facility-administered medications for this visit.      Allergies:   Fluoxetine and Meloxicam   Social History:  The patient  reports that he quit smoking about 22 years ago. he has never used smokeless tobacco. He reports that he drinks about 0.6 oz of alcohol per week. He reports that he does not use drugs.   Family History:   family  history includes Hypertension in his father and mother; Stroke in his mother and sister.    Review of Systems: Review of Systems  Constitutional: Negative.   Respiratory: Negative.   Cardiovascular: Negative.   Gastrointestinal: Negative.   Musculoskeletal: Negative.   Neurological: Negative.   Psychiatric/Behavioral: Negative.   All other systems reviewed and are negative.    PHYSICAL EXAM: VS:  BP (!) 178/72 (BP Location: Right Arm, Patient Position: Sitting, Cuff Size: Normal)   Pulse 62   Ht 5\' 7"  (1.702 m)   Wt 178 lb (80.7 kg)   BMI 27.88 kg/m  , BMI Body mass index is 27.88 kg/m. GEN: Well nourished, well developed, in no acute distress  HEENT: normal  Neck: no JVD, carotid bruits, or masses Cardiac: RRR; no murmurs, rubs, or gallops,no edema  Respiratory:  clear to auscultation bilaterally, normal work of breathing GI: soft, nontender, nondistended, + BS MS: no deformity or atrophy  Skin: warm and dry, no rash Neuro:  Strength and sensation are intact Psych: euthymic mood, full affect    Recent Labs: 12/05/2016: TSH 0.954 03/17/2017: ALT 18; BUN 20; Creatinine, Ser 1.00; Hemoglobin 10.7; Platelets 193; Potassium 4.0; Sodium 139    Lipid Panel Lab Results  Component Value Date   CHOL 163 03/17/2017   HDL 42 03/17/2017   LDLCALC 106 (H) 03/17/2017   TRIG 74 03/17/2017      Wt Readings from Last 3 Encounters:  04/24/17 178 lb (80.7 kg)  03/26/17 180 lb (81.6 kg)  03/17/17 175 lb 6.4 oz (79.6 kg)       ASSESSMENT AND PLAN:  Chest pain, unspecified type - Plan: EKG 12-Lead Atypical chest pain possibly secondary to GERD Negative stress test, echocardiogram  no further testing needed Recommended he stay on his PPI  Essential hypertension Blood pressure markedly elevated on today's visit After long discussion with patient and daughter, we will restart amlodipine 5 mg with titration up to 10 mg over the next week for hypertension  recommended that  he closely monitor blood pressure at home at least twice a day for the next week Suggested he call our office with numbers  Diabetes mellitus without complication ([place]) We have encouraged continued exercise, careful diet management in an effort to lose weight.  Imbalance Presenting with wheelchair, gait instability at baseline  Anxiety Daughter reports that anxiety is a major issue Recently started on BuSpar   Total encounter time more than 45 minutes  Greater than 50% was spent in counseling and coordination of care with the patient    Orders Placed This Encounter  Procedures  . EKG 12-Lead     Signed, Esmond Plants, M.D., Ph.D. 04/24/2017  [facility], [place]

## 2017-04-24 ENCOUNTER — Encounter: Payer: Self-pay | Admitting: Cardiovascular Disease

## 2017-04-24 ENCOUNTER — Ambulatory Visit: Payer: PPO | Admitting: Cardiovascular Disease

## 2017-04-24 VITALS — BP 178/72 | HR 62 | Ht 67.0 in | Wt 178.0 lb

## 2017-04-24 DIAGNOSIS — I10 Essential (primary) hypertension: Secondary | ICD-10-CM | POA: Diagnosis not present

## 2017-04-24 DIAGNOSIS — R079 Chest pain, unspecified: Secondary | ICD-10-CM | POA: Diagnosis not present

## 2017-04-24 DIAGNOSIS — F419 Anxiety disorder, unspecified: Secondary | ICD-10-CM | POA: Diagnosis not present

## 2017-04-24 DIAGNOSIS — R2689 Other abnormalities of gait and mobility: Secondary | ICD-10-CM | POA: Diagnosis not present

## 2017-04-24 DIAGNOSIS — E119 Type 2 diabetes mellitus without complications: Secondary | ICD-10-CM

## 2017-04-24 MED ORDER — AMLODIPINE BESYLATE 10 MG PO TABS: 10.0000 mg | ORAL_TABLET | Freq: Every day | ORAL | 11 refills | Status: DC

## 2017-04-24 MED ORDER — PANTOPRAZOLE SODIUM 40 MG PO TBEC: 40.0000 mg | DELAYED_RELEASE_TABLET | Freq: Every day | ORAL | 3 refills | Status: DC

## 2017-04-24 MED ORDER — ATORVASTATIN CALCIUM 10 MG PO TABS: 10.0000 mg | ORAL_TABLET | Freq: Every day | ORAL | 3 refills | Status: DC

## 2017-04-24 NOTE — Patient Instructions (Addendum)
Medication Instructions:   Please start low dose lipitor/atorvsatatin one a day for cholesterol  Stay on the protonix/pantoprazole one a day for acid/reflux, chest pain  Please start amlodipine 1/2 pill once a day for blood pressure If pressure continues to run high, Take two fo the 1/2 pills of amlodipine  Labwork:  No new labs needed  Testing/Procedures:  No further testing at this time   Follow-Up: It was a pleasure seeing you in the office today. Please call us if you have new issues that need to be addressed before your next appt.  [phone]  Your physician wants you to follow-up in: 6 months.  You will receive a reminder letter in the mail two months in advance. If you don't receive a letter, please call our office to schedule the follow-up appointment.  If you need a refill on your cardiac medications before your next appointment, please call your pharmacy.

## 2017-05-02 ENCOUNTER — Ambulatory Visit: Payer: Self-pay | Admitting: Unknown Physician Specialty

## 2017-05-07 ENCOUNTER — Ambulatory Visit: Payer: PPO | Admitting: Family Medicine

## 2017-05-07 ENCOUNTER — Encounter: Payer: Self-pay | Admitting: Family Medicine

## 2017-05-07 DIAGNOSIS — I10 Essential (primary) hypertension: Secondary | ICD-10-CM | POA: Diagnosis not present

## 2017-05-07 DIAGNOSIS — F411 Generalized anxiety disorder: Secondary | ICD-10-CM

## 2017-05-07 NOTE — Assessment & Plan Note (Signed)
Discuss anxiety roll and blood pressure. Clearly way up with blood pressure. After calming down patient's blood pressure was able to come down some.Also reviewed possibility of dental surgery and I reviewed the role of stress blood pressure and strokes.

## 2017-05-07 NOTE — Assessment & Plan Note (Signed)
discuss hypertension poor control  We will increase amlodipine to 10 mg. Discussed lifestyle measures anxiety.

## 2017-05-07 NOTE — Progress Notes (Signed)
   BP (!) 160/70 (BP Location: Left Arm)   Pulse (!) 47   Wt 176 lb (79.8 kg)   SpO2 95%   BMI 27.57 kg/m    Subjective:    Patient ID: Todd [name], male    DOB: 1931-03-24, 83 y.o.   MRN: [id number]  HPI: Todd [name] is a 83 y.o. male  Chief Complaint  Patient presents with  . Hypertension  patient recheck hypertension accompanied by his daughter patient's under great deal of stress at his home with new family members that are living with him. Otherwise patient appears to be doing well with no complaints Taking blood pressure medicine without problems taking amlodipine 5 mg a day instead of 10. No edema no swelling.  Relevant past medical, surgical, family and social history reviewed and updated as indicated. Interim medical history since our last visit reviewed. Allergies and medications reviewed and updated.  Review of Systems  Constitutional: Negative.   Respiratory: Negative.   Cardiovascular: Negative.     Per HPI unless specifically indicated above     Objective:    BP (!) 160/70 (BP Location: Left Arm)   Pulse (!) 47   Wt 176 lb (79.8 kg)   SpO2 95%   BMI 27.57 kg/m   Wt Readings from Last 3 Encounters:  05/07/17 176 lb (79.8 kg)  04/24/17 178 lb (80.7 kg)  03/26/17 180 lb (81.6 kg)    Physical Exam  Constitutional: He is oriented to person, place, and time. He appears well-developed and well-nourished.  HENT:  Head: Normocephalic and atraumatic.  Eyes: Conjunctivae and EOM are normal.  Neck: Normal range of motion.  Cardiovascular: Normal rate, regular rhythm and normal heart sounds.  Pulmonary/Chest: Effort normal and breath sounds normal.  Musculoskeletal: Normal range of motion.  Neurological: He is alert and oriented to person, place, and time.  Skin: No erythema.  Psychiatric: He has a normal mood and affect. His behavior is normal. Judgment and thought content normal.    Results for orders placed or performed in visit on 03/26/17  Bayer DCA Hb  A1c Waived  Result Value Ref Range   Bayer DCA Hb A1c Waived 5.5 <7.0 %      Assessment & Plan:   Problem List Items Addressed This Visit      Cardiovascular and Mediastinum   Hypertension    discuss hypertension poor control  We will increase amlodipine to 10 mg. Discussed lifestyle measures anxiety.        Other   Anxiety, generalized    Discuss anxiety roll and blood pressure. Clearly way up with blood pressure. After calming down patient's blood pressure was able to come down some.Also reviewed possibility of dental surgery and I reviewed the role of stress blood pressure and strokes.          Follow up plan: Return in about 4 weeks (around 06/04/2017) for BP check.

## 2017-05-17 ENCOUNTER — Emergency Department: Payer: PPO

## 2017-05-17 ENCOUNTER — Inpatient Hospital Stay
Admission: EM | Admit: 2017-05-17 | Discharge: 2017-05-21 | DRG: 066 | Disposition: A | Discharge status: Skilled Nursing Facility | Payer: PPO | Attending: Internal Medicine | Admitting: Internal Medicine

## 2017-05-17 ENCOUNTER — Other Ambulatory Visit: Payer: Self-pay

## 2017-05-17 DIAGNOSIS — F419 Anxiety disorder, unspecified: Secondary | ICD-10-CM | POA: Diagnosis not present

## 2017-05-17 DIAGNOSIS — K219 Gastro-esophageal reflux disease without esophagitis: Secondary | ICD-10-CM | POA: Diagnosis present

## 2017-05-17 DIAGNOSIS — R4781 Slurred speech: Secondary | ICD-10-CM | POA: Diagnosis not present

## 2017-05-17 DIAGNOSIS — R279 Unspecified lack of coordination: Secondary | ICD-10-CM | POA: Diagnosis not present

## 2017-05-17 DIAGNOSIS — Z87891 Personal history of nicotine dependence: Secondary | ICD-10-CM

## 2017-05-17 DIAGNOSIS — E1169 Type 2 diabetes mellitus with other specified complication: Secondary | ICD-10-CM

## 2017-05-17 DIAGNOSIS — R079 Chest pain, unspecified: Secondary | ICD-10-CM | POA: Diagnosis not present

## 2017-05-17 DIAGNOSIS — R4701 Aphasia: Secondary | ICD-10-CM | POA: Diagnosis not present

## 2017-05-17 DIAGNOSIS — Z7982 Long term (current) use of aspirin: Secondary | ICD-10-CM | POA: Diagnosis not present

## 2017-05-17 DIAGNOSIS — F329 Major depressive disorder, single episode, unspecified: Secondary | ICD-10-CM | POA: Diagnosis present

## 2017-05-17 DIAGNOSIS — G459 Transient cerebral ischemic attack, unspecified: Secondary | ICD-10-CM

## 2017-05-17 DIAGNOSIS — I679 Cerebrovascular disease, unspecified: Secondary | ICD-10-CM | POA: Diagnosis not present

## 2017-05-17 DIAGNOSIS — R262 Difficulty in walking, not elsewhere classified: Secondary | ICD-10-CM | POA: Diagnosis not present

## 2017-05-17 DIAGNOSIS — Z96653 Presence of artificial knee joint, bilateral: Secondary | ICD-10-CM | POA: Diagnosis not present

## 2017-05-17 DIAGNOSIS — Z8546 Personal history of malignant neoplasm of prostate: Secondary | ICD-10-CM | POA: Diagnosis not present

## 2017-05-17 DIAGNOSIS — Z79899 Other long term (current) drug therapy: Secondary | ICD-10-CM | POA: Diagnosis not present

## 2017-05-17 DIAGNOSIS — R29706 NIHSS score 6: Secondary | ICD-10-CM | POA: Diagnosis not present

## 2017-05-17 DIAGNOSIS — E1151 Type 2 diabetes mellitus with diabetic peripheral angiopathy without gangrene: Secondary | ICD-10-CM | POA: Diagnosis present

## 2017-05-17 DIAGNOSIS — I10 Essential (primary) hypertension: Secondary | ICD-10-CM | POA: Diagnosis not present

## 2017-05-17 DIAGNOSIS — E119 Type 2 diabetes mellitus without complications: Secondary | ICD-10-CM | POA: Diagnosis not present

## 2017-05-17 DIAGNOSIS — Z7984 Long term (current) use of oral hypoglycemic drugs: Secondary | ICD-10-CM | POA: Diagnosis not present

## 2017-05-17 DIAGNOSIS — I2510 Atherosclerotic heart disease of native coronary artery without angina pectoris: Secondary | ICD-10-CM | POA: Diagnosis not present

## 2017-05-17 DIAGNOSIS — R1312 Dysphagia, oropharyngeal phase: Secondary | ICD-10-CM | POA: Diagnosis not present

## 2017-05-17 DIAGNOSIS — E785 Hyperlipidemia, unspecified: Secondary | ICD-10-CM | POA: Diagnosis present

## 2017-05-17 DIAGNOSIS — I639 Cerebral infarction, unspecified: Secondary | ICD-10-CM | POA: Diagnosis not present

## 2017-05-17 DIAGNOSIS — M6281 Muscle weakness (generalized): Secondary | ICD-10-CM | POA: Diagnosis not present

## 2017-05-17 DIAGNOSIS — E1142 Type 2 diabetes mellitus with diabetic polyneuropathy: Secondary | ICD-10-CM

## 2017-05-17 DIAGNOSIS — R482 Apraxia: Secondary | ICD-10-CM | POA: Diagnosis not present

## 2017-05-17 DIAGNOSIS — R29810 Facial weakness: Secondary | ICD-10-CM | POA: Diagnosis present

## 2017-05-17 DIAGNOSIS — F411 Generalized anxiety disorder: Secondary | ICD-10-CM

## 2017-05-17 DIAGNOSIS — I6930 Unspecified sequelae of cerebral infarction: Secondary | ICD-10-CM | POA: Diagnosis present

## 2017-05-17 LAB — CBC
HCT: 33.7 % — ABNORMAL LOW (ref 40.0–52.0)
Hemoglobin: 11 g/dL — ABNORMAL LOW (ref 13.0–18.0)
MCH: 30.9 pg (ref 26.0–34.0)
MCHC: 32.7 g/dL (ref 32.0–36.0)
MCV: 94.3 fL (ref 80.0–100.0)
Platelets: 231 10*3/uL (ref 150–440)
RBC: 3.57 MIL/uL — ABNORMAL LOW (ref 4.40–5.90)
RDW: 13.4 % (ref 11.5–14.5)
WBC: 4.9 10*3/uL (ref 3.8–10.6)

## 2017-05-17 LAB — BASIC METABOLIC PANEL
Anion gap: 8 (ref 5–15)
BUN: 24 mg/dL — ABNORMAL HIGH (ref 6–20)
CO2: 26 mmol/L (ref 22–32)
Calcium: 9.2 mg/dL (ref 8.9–10.3)
Chloride: 104 mmol/L (ref 101–111)
Creatinine, Ser: 1.19 mg/dL (ref 0.61–1.24)
GFR calc Af Amer: >60 mL/min (ref >60)
GFR calc non Af Amer: 54 mL/min — ABNORMAL LOW (ref >60)
Glucose, Bld: 180 mg/dL — ABNORMAL HIGH (ref 65–99)
Potassium: 3.7 mmol/L (ref 3.5–5.1)
Sodium: 138 mmol/L (ref 135–145)

## 2017-05-17 LAB — TROPONIN I: Troponin I: 0.03 ng/mL (ref <0.03)

## 2017-05-17 NOTE — ED Provider Notes (Signed)
[facility] Emergency Department Provider Note  ____________________________________________   First MD Initiated Contact with Patient 05/17/17 2308     (approximate)  I have reviewed the triage vital signs and the nursing notes.   HISTORY  Chief Complaint Weakness   HPI Todd [name] is a 83 y.o. male is brought to the emergency department family for an episode of slurred speech for several hours earlier today.  Patient has a complex past medical history including diabetes, hypertension, coronary artery disease.  He is never had a stroke before.  Family says intermittently for the past several months he has had episodes where it is difficult for him to talk and articulate.  Patient himself has been frustrated today that he has had difficulty talking.  He denies numbness or weakness.  He denies headache.  His symptoms began suddenly and went away on their own.  Nothing seemed to make them better or worse.  Past Medical History:  Diagnosis Date  . Accelerated hypertension 03/04/2015  . Anxiety   . Aortic regurgitation   . Arthritis    osteoarthritis-left knee  . CAD (coronary artery disease)   . Depression   . Diabetes mellitus without complication ([place])   . ED (erectile dysfunction)   . GERD (gastroesophageal reflux disease)   . Glaucoma   . Hyperlipidemia   . Hypertension   . Iron deficiency anemia due to chronic blood loss   . Left heart failure ([place])   . Lumbago    Lumbosacral Neuritis  . Prostate cancer [facility])     Patient Active Problem List   Diagnosis Date Noted  . Aphasia 05/18/2017  . CAD (coronary artery disease) 05/18/2017  . Acute CVA (cerebrovascular accident) ([place]) 05/18/2017  . Chest pain 03/17/2017  . 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 Urinary incontinence 04/27/2015  . Anxiety, generalized 04/07/2015  . History of BCG vaccination 04/07/2015  . GERD (gastroesophageal reflux disease) 03/04/2015  . Anxiety  03/04/2015  . Fatigue 03/01/2015  . Depression 02/25/2015  . Degeneration of intervertebral disc of lumbar region 02/23/2015  . Neuritis or radiculitis due to rupture of lumbar intervertebral disc 02/23/2015  . Lumbar canal stenosis 02/23/2015  . Hypertension 12/02/2014  . Diabetes mellitus without complication ([place]) 10/62/6948  . Benign essential tremor 03/13/2014  . Imbalance 03/13/2014  . CA of prostate ([place]) 10/07/2012  . Malignant neoplasm of prostate ([place]) 10/07/2012  . ED (erectile dysfunction) of organic origin 02/09/2012    Past Surgical History:  Procedure Laterality Date  . CARPAL TUNNEL RELEASE    . REPLACEMENT TOTAL KNEE BILATERAL      Prior to Admission medications   Medication Sig Start Date End Date Taking? Authorizing Provider  amLODipine (NORVASC) 10 MG tablet Take 1 tablet (10 mg total) by mouth daily. Patient taking differently: Take 5 mg by mouth daily.  04/24/17  Yes Minna Merritts, MD  aspirin EC 81 MG tablet Take 81 mg by mouth daily.   Yes [provider]  atorvastatin (LIPITOR) 10 MG tablet Take 1 tablet (10 mg total) by mouth daily. 04/24/17  Yes Gollan, Kathlene November, MD  busPIRone (BUSPAR) 10 MG tablet Take 10 mg by mouth at bedtime.    Yes [provider]  clonazePAM (KLONOPIN) 0.5 MG tablet Take 1 tablet (0.5 mg total) by mouth daily as needed for anxiety. 12/05/16  Yes Crissman, Jeannette How, MD  DULoxetine (CYMBALTA) 20 MG capsule Take 1 capsule (20 mg total) by mouth daily. Patient taking  differently: Take 20 mg by mouth at bedtime.  12/05/16  Yes Crissman, Jeannette How, MD  gabapentin (NEURONTIN) 100 MG capsule Take 100 mg by mouth 2 (two) times daily.    Yes [provider]  latanoprost (XALATAN) 0.005 % ophthalmic solution Place 1 drop into both eyes at bedtime.  06/08/16  Yes [provider]  metFORMIN (GLUCOPHAGE) 500 MG tablet Take 1 tablet (500 mg total) by mouth daily with breakfast. 01/09/17  Yes Johnson, Megan P, DO    pantoprazole (PROTONIX) 40 MG tablet Take 1 tablet (40 mg total) by mouth daily. 04/24/17  Yes Gollan, Kathlene November, MD  propranolol ER (INDERAL LA) 80 MG 24 hr capsule Take 1 capsule (80 mg total) by mouth daily. 12/05/16  Yes Crissman, Jeannette How, MD  telmisartan (MICARDIS) 80 MG tablet Take 1 tablet (80 mg total) by mouth daily. 12/05/16  Yes Crissman, Jeannette How, MD    Allergies Fluoxetine and Meloxicam  Family History  Problem Relation Age of Onset  . Stroke Mother   . Hypertension Mother   . Hypertension Father   . Stroke Sister   . Prostate cancer Neg Hx   . Kidney cancer Neg Hx   . Bladder Cancer Neg Hx     Social History Social History   Tobacco Use  . Smoking status: Former Smoker    Last attempt to quit: 12/02/1994    Years since quitting: 22.4  . Smokeless tobacco: Never Used  Substance Use Topics  . Alcohol use: Yes    Alcohol/week: 0.6 oz    Types: 1 Cans of beer per week  . Drug use: No    Review of Systems Constitutional: No fever/chills Eyes: No visual changes. ENT: No sore throat. Cardiovascular: Denies chest pain. Respiratory: Denies shortness of breath. Gastrointestinal: No abdominal pain.  No nausea, no vomiting.  No diarrhea.  No constipation. Genitourinary: Negative for dysuria. Musculoskeletal: Negative for back pain. Skin: Negative for rash. Neurological: Negative for headaches, focal weakness or numbness.   ____________________________________________   PHYSICAL EXAM:  VITAL SIGNS: ED Triage Vitals  Enc Vitals Group     BP 05/17/17 2242 132/86     Pulse Rate 05/17/17 2242 64     Resp 05/17/17 2242 18     Temp 05/17/17 2242 98.1 F (36.7 C)     Temp Source 05/17/17 2242 Oral     SpO2 05/17/17 2242 98 %     Weight 05/17/17 2234 176 lb (79.8 kg)     Height 05/17/17 2234 5\' 7"  (1.702 m)     Head Circumference --      Peak Flow --      Pain Score 05/17/17 2234 0     Pain Loc --      Pain Edu? --      Excl. in [place]? --     Constitutional:  Pleasant cooperative speaks with somewhat slurred speech no diaphoresis Eyes: PERRL EOMI. Head: Atraumatic. Nose: No congestion/rhinnorhea. Mouth/Throat: No trismus Neck: No stridor.   Cardiovascular: Normal rate, regular rhythm. Grossly normal heart sounds.  Good peripheral circulation. Respiratory: Normal respiratory effort.  No retractions. Lungs CTAB and moving good air Gastrointestinal: Soft nontender Musculoskeletal: No lower extremity edema   Neurologic: Mild slurred speech 5 out of 5 right upper left upper right lower left lower sensation intact light touch throughout Skin:  Skin is warm, dry and intact. No rash noted. Psychiatric: Mood and affect are normal.     ____________________________________________   DIFFERENTIAL includes but  not limited to  Stroke, TIA, metabolic derangement, acute coronary syndrome ____________________________________________   LABS (all labs ordered are listed, but only abnormal results are displayed)  Labs Reviewed  BASIC METABOLIC PANEL - Abnormal; Notable for the following components:      Result Value   Glucose, Bld 180 (*)    BUN 24 (*)    GFR calc non Af Amer 54 (*)    All other components within normal limits  CBC - Abnormal; Notable for the following components:   RBC 3.57 (*)    Hemoglobin 11.0 (*)    HCT 33.7 (*)    All other components within normal limits  URINALYSIS, COMPLETE (UACMP) WITH MICROSCOPIC - Abnormal; Notable for the following components:   Color, Urine YELLOW (*)    APPearance CLEAR (*)    Squamous Epithelial / LPF 0-5 (*)    All other components within normal limits  TROPONIN I - Abnormal; Notable for the following components:   Troponin I 0.03 (*)    All other components within normal limits  TROPONIN I - Abnormal; Notable for the following components:   Troponin I 0.03 (*)    All other components within normal limits  GLUCOSE, CAPILLARY - Abnormal; Notable for the following components:    Glucose-Capillary 124 (*)    All other components within normal limits  HEMOGLOBIN A1C - Abnormal; Notable for the following components:   Hgb A1c MFr Bld 6.3 (*)    All other components within normal limits  LIPID PANEL - Abnormal; Notable for the following components:   HDL 36 (*)    All other components within normal limits  CBC - Abnormal; Notable for the following components:   RBC 3.25 (*)    Hemoglobin 10.2 (*)    HCT 30.6 (*)    All other components within normal limits  GLUCOSE, CAPILLARY - Abnormal; Notable for the following components:   Glucose-Capillary 122 (*)    All other components within normal limits  GLUCOSE, CAPILLARY - Abnormal; Notable for the following components:   Glucose-Capillary 152 (*)    All other components within normal limits  GLUCOSE, CAPILLARY - Abnormal; Notable for the following components:   Glucose-Capillary 156 (*)    All other components within normal limits  GLUCOSE, CAPILLARY - Abnormal; Notable for the following components:   Glucose-Capillary 118 (*)    All other components within normal limits  CREATININE, SERUM  CBG MONITORING, ED    Blood work reviewed by me shows slightly elevated troponin which is nonspecific __________________________________________  EKG  ED ECG REPORT I, Darel Hong, the attending physician, personally viewed and interpreted this ECG.  Date: 05/17/2017 EKG Time:  Rate: 65 Rhythm: normal sinus rhythm QRS Axis: normal Intervals: normal ST/T Wave abnormalities: normal Narrative Interpretation: no evidence of acute ischemia  ____________________________________________  RADIOLOGY  CT head with chronic changes but no acute disease ____________________________________________   PROCEDURES  Procedure(s) performed: no  Procedures  Critical Care performed: no  Observation: no ____________________________________________   INITIAL IMPRESSION / ASSESSMENT AND PLAN / ED COURSE  Pertinent  labs & imaging results that were available during my care of the patient were reviewed by me and considered in my medical decision making (see chart for details).       ----------------------------------------- 12:46 AM on 05/18/2017 -----------------------------------------  While the patient is currently back at his baseline, he had significant aphasia earlier today along with a history of hypertension diabetes and coronary artery disease which is concerning.  He is never had a stroke workup before and at this point I believe he requires inpatient admission I discussed the patient is family at bedside who verbalized understanding and agree with the plan.  I then discussed with the hospitalist Dr. Jannifer Franklin who is graciously agreed to admit the patient to his service. ____________________________________________   FINAL CLINICAL IMPRESSION(S) / ED DIAGNOSES  Final diagnoses:  TIA (transient ischemic attack)      NEW MEDICATIONS STARTED DURING THIS VISIT:  This SmartLink is deprecated. Use AVSMEDLIST instead to display the medication list for a patient.   Note:  This document was prepared using Dragon voice recognition software and may include unintentional dictation errors.     Darel Hong, MD 05/19/17 [phone]

## 2017-05-17 NOTE — ED Notes (Signed)
Dr Mable Paris notified at this time of pt's elevated Troponin level as reported by lab= 0.03ng/mL

## 2017-05-17 NOTE — ED Triage Notes (Signed)
Pt presents to ED via POV from home with c/o weakness and "more slurred speech than normal". Family reports s/x's started around 4pm. EMS seen pt at home but was not brought in at that time. Family states his "words aren't coming out as well as they normally do". No h/x of TIA/CVA or MI.

## 2017-05-17 NOTE — ED Notes (Signed)
Patient transported to CT at this time. 

## 2017-05-18 ENCOUNTER — Observation Stay (HOSPITAL_BASED_OUTPATIENT_CLINIC_OR_DEPARTMENT_OTHER)
Admit: 2017-05-18 | Discharge: 2017-05-18 | Disposition: A | Discharge status: Home / Self Care | Payer: PPO | Attending: Internal Medicine | Admitting: Internal Medicine

## 2017-05-18 ENCOUNTER — Other Ambulatory Visit: Payer: Self-pay

## 2017-05-18 ENCOUNTER — Observation Stay: Payer: PPO

## 2017-05-18 DIAGNOSIS — R079 Chest pain, unspecified: Secondary | ICD-10-CM | POA: Diagnosis not present

## 2017-05-18 DIAGNOSIS — Z7984 Long term (current) use of oral hypoglycemic drugs: Secondary | ICD-10-CM | POA: Diagnosis not present

## 2017-05-18 DIAGNOSIS — F419 Anxiety disorder, unspecified: Secondary | ICD-10-CM | POA: Diagnosis present

## 2017-05-18 DIAGNOSIS — Z96653 Presence of artificial knee joint, bilateral: Secondary | ICD-10-CM | POA: Diagnosis present

## 2017-05-18 DIAGNOSIS — R262 Difficulty in walking, not elsewhere classified: Secondary | ICD-10-CM | POA: Diagnosis present

## 2017-05-18 DIAGNOSIS — E119 Type 2 diabetes mellitus without complications: Secondary | ICD-10-CM | POA: Diagnosis not present

## 2017-05-18 DIAGNOSIS — R4701 Aphasia: Secondary | ICD-10-CM | POA: Diagnosis present

## 2017-05-18 DIAGNOSIS — I10 Essential (primary) hypertension: Secondary | ICD-10-CM | POA: Diagnosis present

## 2017-05-18 DIAGNOSIS — E785 Hyperlipidemia, unspecified: Secondary | ICD-10-CM | POA: Diagnosis present

## 2017-05-18 DIAGNOSIS — F329 Major depressive disorder, single episode, unspecified: Secondary | ICD-10-CM | POA: Diagnosis present

## 2017-05-18 DIAGNOSIS — Z87891 Personal history of nicotine dependence: Secondary | ICD-10-CM | POA: Diagnosis not present

## 2017-05-18 DIAGNOSIS — I639 Cerebral infarction, unspecified: Secondary | ICD-10-CM | POA: Diagnosis present

## 2017-05-18 DIAGNOSIS — Z7982 Long term (current) use of aspirin: Secondary | ICD-10-CM | POA: Diagnosis not present

## 2017-05-18 DIAGNOSIS — K219 Gastro-esophageal reflux disease without esophagitis: Secondary | ICD-10-CM | POA: Diagnosis present

## 2017-05-18 DIAGNOSIS — R29810 Facial weakness: Secondary | ICD-10-CM | POA: Diagnosis present

## 2017-05-18 DIAGNOSIS — E1151 Type 2 diabetes mellitus with diabetic peripheral angiopathy without gangrene: Secondary | ICD-10-CM | POA: Diagnosis present

## 2017-05-18 DIAGNOSIS — R4781 Slurred speech: Secondary | ICD-10-CM | POA: Diagnosis not present

## 2017-05-18 DIAGNOSIS — R29706 NIHSS score 6: Secondary | ICD-10-CM | POA: Diagnosis present

## 2017-05-18 DIAGNOSIS — I2510 Atherosclerotic heart disease of native coronary artery without angina pectoris: Secondary | ICD-10-CM | POA: Diagnosis present

## 2017-05-18 DIAGNOSIS — Z8546 Personal history of malignant neoplasm of prostate: Secondary | ICD-10-CM | POA: Diagnosis not present

## 2017-05-18 DIAGNOSIS — G459 Transient cerebral ischemic attack, unspecified: Secondary | ICD-10-CM | POA: Diagnosis present

## 2017-05-18 DIAGNOSIS — I6930 Unspecified sequelae of cerebral infarction: Secondary | ICD-10-CM | POA: Diagnosis present

## 2017-05-18 DIAGNOSIS — Z79899 Other long term (current) drug therapy: Secondary | ICD-10-CM | POA: Diagnosis not present

## 2017-05-18 LAB — ECHOCARDIOGRAM COMPLETE
Height: 66 in
Weight: 2768 oz

## 2017-05-18 LAB — URINALYSIS, COMPLETE (UACMP) WITH MICROSCOPIC
Bacteria, UA: NONE SEEN
Bilirubin Urine: NEGATIVE
Glucose, UA: NEGATIVE mg/dL
Hgb urine dipstick: NEGATIVE
Ketones, ur: NEGATIVE mg/dL
Leukocytes, UA: NEGATIVE
Nitrite: NEGATIVE
Protein, ur: NEGATIVE mg/dL
Specific Gravity, Urine: 1.015 (ref 1.005–1.030)
pH: 5 (ref 5.0–8.0)

## 2017-05-18 LAB — LIPID PANEL
Cholesterol: 122 mg/dL (ref 0–200)
HDL: 36 mg/dL — ABNORMAL LOW (ref >40)
LDL Cholesterol: 67 mg/dL (ref 0–99)
Total CHOL/HDL Ratio: 3.4 RATIO
Triglycerides: 97 mg/dL (ref <150)
VLDL: 19 mg/dL (ref 0–40)

## 2017-05-18 LAB — CREATININE, SERUM
Creatinine, Ser: 1 mg/dL (ref 0.61–1.24)
GFR calc Af Amer: >60 mL/min (ref >60)
GFR calc non Af Amer: >60 mL/min (ref >60)

## 2017-05-18 LAB — CBC
HCT: 30.6 % — ABNORMAL LOW (ref 40.0–52.0)
Hemoglobin: 10.2 g/dL — ABNORMAL LOW (ref 13.0–18.0)
MCH: 31.3 pg (ref 26.0–34.0)
MCHC: 33.3 g/dL (ref 32.0–36.0)
MCV: 94.2 fL (ref 80.0–100.0)
Platelets: 198 10*3/uL (ref 150–440)
RBC: 3.25 MIL/uL — ABNORMAL LOW (ref 4.40–5.90)
RDW: 13.4 % (ref 11.5–14.5)
WBC: 4.8 10*3/uL (ref 3.8–10.6)

## 2017-05-18 LAB — GLUCOSE, CAPILLARY
Glucose-Capillary: 124 mg/dL — ABNORMAL HIGH (ref 65–99)
Glucose-Capillary: 122 mg/dL — ABNORMAL HIGH (ref 65–99)
Glucose-Capillary: 152 mg/dL — ABNORMAL HIGH (ref 65–99)
Glucose-Capillary: 156 mg/dL — ABNORMAL HIGH (ref 65–99)
Glucose-Capillary: 118 mg/dL — ABNORMAL HIGH (ref 65–99)

## 2017-05-18 LAB — TROPONIN I: Troponin I: 0.03 ng/mL (ref <0.03)

## 2017-05-18 MED ORDER — DULOXETINE HCL 20 MG PO CPEP
20.0000 mg | ORAL_CAPSULE | Freq: Every day | ORAL | Status: DC
  Administered 2017-05-18 – 2017-05-20 (×3): 20 mg via ORAL
  Filled 2017-05-18 (×5): qty 1

## 2017-05-18 MED ORDER — INSULIN ASPART 100 UNIT/ML ~~LOC~~ SOLN: 0.0000 [IU] | Freq: Every day | SUBCUTANEOUS | Status: DC

## 2017-05-18 MED ORDER — BUSPIRONE HCL 5 MG PO TABS
10.0000 mg | ORAL_TABLET | Freq: Every day | ORAL | Status: DC
  Administered 2017-05-18 – 2017-05-20 (×3): 10 mg via ORAL
  Filled 2017-05-18 (×5): qty 2

## 2017-05-18 MED ORDER — STROKE: EARLY STAGES OF RECOVERY BOOK
Freq: Once | Status: AC
  Administered 2017-05-18: 03:00:00

## 2017-05-18 MED ORDER — ACETAMINOPHEN 160 MG/5ML PO SOLN: 650.0000 mg | ORAL | Status: DC | PRN

## 2017-05-18 MED ORDER — ENOXAPARIN SODIUM 40 MG/0.4ML ~~LOC~~ SOLN
40.0000 mg | SUBCUTANEOUS | Status: DC
  Administered 2017-05-18 – 2017-05-20 (×3): 40 mg via SUBCUTANEOUS
  Filled 2017-05-18 (×3): qty 0.4

## 2017-05-18 MED ORDER — CLONAZEPAM 0.5 MG PO TABS: 0.5000 mg | ORAL_TABLET | Freq: Every day | ORAL | Status: DC | PRN

## 2017-05-18 MED ORDER — ACETAMINOPHEN 650 MG RE SUPP: 650.0000 mg | RECTAL | Status: DC | PRN

## 2017-05-18 MED ORDER — GABAPENTIN 100 MG PO CAPS
100.0000 mg | ORAL_CAPSULE | Freq: Two times a day (BID) | ORAL | Status: DC
  Administered 2017-05-18 – 2017-05-21 (×6): 100 mg via ORAL
  Filled 2017-05-18 (×6): qty 1

## 2017-05-18 MED ORDER — PANTOPRAZOLE SODIUM 40 MG PO TBEC
40.0000 mg | DELAYED_RELEASE_TABLET | Freq: Every day | ORAL | Status: DC
  Administered 2017-05-18 – 2017-05-21 (×4): 40 mg via ORAL
  Filled 2017-05-18 (×4): qty 1

## 2017-05-18 MED ORDER — ASPIRIN EC 81 MG PO TBEC
81.0000 mg | DELAYED_RELEASE_TABLET | Freq: Every day | ORAL | Status: DC
  Administered 2017-05-18 – 2017-05-21 (×4): 81 mg via ORAL
  Filled 2017-05-18 (×4): qty 1

## 2017-05-18 MED ORDER — LORAZEPAM 2 MG/ML IJ SOLN
1.0000 mg | INTRAMUSCULAR | Status: AC
  Administered 2017-05-18: 10:00:00 1 mg via INTRAVENOUS
  Filled 2017-05-18: qty 1

## 2017-05-18 MED ORDER — ACETAMINOPHEN 325 MG PO TABS
650.0000 mg | ORAL_TABLET | ORAL | Status: DC | PRN
  Administered 2017-05-18 – 2017-05-20 (×2): 650 mg via ORAL
  Filled 2017-05-18 (×2): qty 2

## 2017-05-18 MED ORDER — ATORVASTATIN CALCIUM 20 MG PO TABS
10.0000 mg | ORAL_TABLET | Freq: Every day | ORAL | Status: DC
  Administered 2017-05-18 – 2017-05-21 (×4): 10 mg via ORAL
  Filled 2017-05-18 (×4): qty 1

## 2017-05-18 MED ORDER — INSULIN ASPART 100 UNIT/ML ~~LOC~~ SOLN
0.0000 [IU] | Freq: Three times a day (TID) | SUBCUTANEOUS | Status: DC
  Administered 2017-05-18: 19:00:00 2 [IU] via SUBCUTANEOUS
  Administered 2017-05-18: 08:00:00 1 [IU] via SUBCUTANEOUS
  Administered 2017-05-18: 13:00:00 2 [IU] via SUBCUTANEOUS
  Administered 2017-05-19: 17:00:00 1 [IU] via SUBCUTANEOUS
  Administered 2017-05-19 – 2017-05-20 (×2): 2 [IU] via SUBCUTANEOUS
  Administered 2017-05-20: 1 [IU] via SUBCUTANEOUS
  Administered 2017-05-20 – 2017-05-21 (×2): 2 [IU] via SUBCUTANEOUS
  Administered 2017-05-21: 08:00:00 1 [IU] via SUBCUTANEOUS
  Filled 2017-05-18 (×10): qty 1

## 2017-05-18 MED ORDER — DIPHENHYDRAMINE HCL 25 MG PO CAPS
25.0000 mg | ORAL_CAPSULE | Freq: Every evening | ORAL | Status: DC | PRN
  Administered 2017-05-19: 25 mg via ORAL
  Filled 2017-05-18 (×2): qty 1

## 2017-05-18 NOTE — Progress Notes (Signed)
Admitted for worsening slurred speech and possible stroke.  CT head unremarkable.  Alert, awake patient has expressive aphasia.  According to daughter it is getting worse recently.  Patient does have slurred speech for a long time for years.  Vitals reviewed Lab data reviewed Radiology MRI of the brain showed acute to subacute left pontine stroke and chronic left thalamic stroke.  Awaiting for echo, ultrasound of carotids results of echocardiogram done in October showed EF is normal.  Continue aspirin, neurology consult, speech therapy consult, physical therapy consult.  Continue aspirin, statins . 2.anxiety: Patient daughter states that he is under a lot of stress, his son, daughter-in-law moved with him as in March and are trying to occupy his whole house and spending money and also possibly stealing money.  Patient is on BuSpar, Klonopin, Cymbalta. #3 .malignant essential hypertension: Recently at PCP office BP was high so increased the dose of Norvasc.   Time spent 25 minutes

## 2017-05-18 NOTE — ED Notes (Signed)
Attempted to call report at this time. Pt's room assignment being changed from 1C-132 to 1C-112; Kennyth Lose, RN stated EVS notified of STAT room cleaning, will re-attempt to call report in 20 minutes.

## 2017-05-18 NOTE — Care Management Note (Signed)
Case Management Note  Patient Details  Name: Todd [name] MRN: [id number] Date of Birth: [birth year]  Subjective/Objective:   Admitted to [facility] under observation status with the diagnosis of aphagia, Lives with Ezequiel Ganser and [place] wife. Daughter is [name] [phone]). Last seen Dr, Jeananne Rama 05/07/17. Prescriptions are filled at [facility] in [place]. No home Health. No skilled facility. No home oxygen.   Rolling walker and cane in the home.     [place] last summer. Decreased appetite. Takes care of all basic activities of daily living himself.  Family will transport          Action/Plan: Will continue to follow for discharge plans  Expected Discharge Date:                  Expected Discharge Plan:     In-House Referral:     Discharge planning Services     Post Acute Care Choice:    Choice offered to:     DME Arranged:    DME Agency:     HH Arranged:    [place] Agency:     Status of Service:     If discussed at [employer] [facility] [employer], dates discussed:    Additional Comments:  [name], RN MSN CCM Care Management [phone] 05/18/2017, 2:16 PM

## 2017-05-18 NOTE — H&P (Signed)
[place] [facility] [place] NAME: Todd [name]    MR#:  [id number]  DATE OF BIRTH:  1930-09-14  DATE OF ADMISSION:  05/17/2017  PRIMARY CARE PHYSICIAN: Guadalupe Maple, MD   REQUESTING/REFERRING PHYSICIAN: Mable Paris, MD  CHIEF COMPLAINT:   Chief Complaint  Patient presents with  . Weakness    HISTORY OF PRESENT ILLNESS:  Todd [name]  is a 83 y.o. male who presents with aphasia.  Patient's family states that he has been having what seems to have been stuttering symptoms for the last several weeks.  They state that he has difficulty getting his words out sometimes, and other times is able to communicate very well.  Patient came in for evaluation tonight as his symptoms got much worse today.  He is currently unable to speak much more than 2-3 words total coherently, only 1 of the time and with great effort to get out 2 or 3 total before he becomes too frustrated to continue trying.  Initial workup in the ED is largely within normal limits, but there is obviously a great concern for possible stroke.  Hospitalist were called for admission  PAST MEDICAL HISTORY:   Past Medical History:  Diagnosis Date  . Accelerated hypertension 03/04/2015  . Anxiety   . Aortic regurgitation   . Arthritis    osteoarthritis-left knee  . CAD (coronary artery disease)   . Depression   . Diabetes mellitus without complication ([place])   . ED (erectile dysfunction)   . GERD (gastroesophageal reflux disease)   . Glaucoma   . Hyperlipidemia   . Hypertension   . Iron deficiency anemia due to chronic blood loss   . Left heart failure ([place])   . Lumbago    Lumbosacral Neuritis  . Prostate cancer ([place])     PAST SURGICAL HISTORY:   Past Surgical History:  Procedure Laterality Date  . CARPAL TUNNEL RELEASE    . REPLACEMENT TOTAL KNEE BILATERAL      SOCIAL HISTORY:   Social History   Tobacco Use  . Smoking status: Former Smoker    Last attempt to quit:  12/02/1994    Years since quitting: 22.4  . Smokeless tobacco: Never Used  Substance Use Topics  . Alcohol use: Yes    Alcohol/week: 0.6 oz    Types: 1 Cans of beer per week    FAMILY HISTORY:   Family History  Problem Relation Age of Onset  . Stroke Mother   . Hypertension Mother   . Hypertension Father   . Stroke Sister   . Prostate cancer Neg Hx   . Kidney cancer Neg Hx   . Bladder Cancer Neg Hx     DRUG ALLERGIES:   Allergies  Allergen Reactions  . Fluoxetine     headache  . Meloxicam Other (See Comments)    Other reaction(s): Dizziness Nervousness.     MEDICATIONS AT HOME:   Prior to Admission medications   Medication Sig Start Date End Date Taking? Authorizing Provider  amLODipine (NORVASC) 10 MG tablet Take 1 tablet (10 mg total) by mouth daily. 04/24/17   Minna Merritts, MD  aspirin EC 81 MG tablet Take 81 mg by mouth daily.    [provider]  atorvastatin (LIPITOR) 10 MG tablet Take 1 tablet (10 mg total) by mouth daily. 04/24/17   Minna Merritts, MD  busPIRone (BUSPAR) 10 MG tablet Take 10 mg by mouth daily.     [provider]  clonazePAM (KLONOPIN) 0.5 MG tablet Take 1 tablet (0.5 mg total) by mouth daily as needed for anxiety. 12/05/16   Guadalupe Maple, MD  DULoxetine (CYMBALTA) 20 MG capsule Take 1 capsule (20 mg total) by mouth daily. 12/05/16   Guadalupe Maple, MD  gabapentin (NEURONTIN) 100 MG capsule Take 100 mg by mouth 2 (two) times daily.     [provider]  latanoprost (XALATAN) 0.005 % ophthalmic solution  06/08/16   [provider]  metFORMIN (GLUCOPHAGE) 500 MG tablet Take 1 tablet (500 mg total) by mouth daily with breakfast. 01/09/17   Park Liter P, DO  pantoprazole (PROTONIX) 40 MG tablet Take 1 tablet (40 mg total) by mouth daily. 04/24/17   Minna Merritts, MD  propranolol ER (INDERAL LA) 80 MG 24 hr capsule Take 1 capsule (80 mg total) by mouth daily. 12/05/16   Guadalupe Maple, MD  telmisartan  (MICARDIS) 80 MG tablet Take 1 tablet (80 mg total) by mouth daily. 12/05/16   Guadalupe Maple, MD    REVIEW OF SYSTEMS:  Review of Systems  Unable to perform ROS: Acuity of condition     VITAL SIGNS:   Vitals:   05/17/17 2234 05/17/17 2242 05/18/17 0038 05/18/17 0040  BP:  132/86  (!) 177/99  Pulse:  64  77  Resp:  18  18  Temp:  98.1 F (36.7 C) 98.3 F (36.8 C)   TempSrc:  Oral    SpO2:  98%  95%  Weight: 79.8 kg (176 lb)     Height: 5\' 7"  (1.702 m)      Wt Readings from Last 3 Encounters:  05/17/17 79.8 kg (176 lb)  05/07/17 79.8 kg (176 lb)  04/24/17 80.7 kg (178 lb)    PHYSICAL EXAMINATION:  Physical Exam  Vitals reviewed. Constitutional: He is oriented to person, place, and time. He appears well-developed and well-nourished. No distress.  HENT:  Head: Normocephalic and atraumatic.  Mouth/Throat: Oropharynx is clear and moist.  Eyes: Conjunctivae and EOM are normal. Pupils are equal, round, and reactive to light. No scleral icterus.  Neck: Normal range of motion. Neck supple. No JVD present. No thyromegaly present.  Cardiovascular: Normal rate, regular rhythm and intact distal pulses. Exam reveals no gallop and no friction rub.  No murmur heard. Respiratory: Effort normal and breath sounds normal. No respiratory distress. He has no wheezes. He has no rales.  GI: Soft. Bowel sounds are normal. He exhibits no distension. There is no tenderness.  Musculoskeletal: Normal range of motion. He exhibits no edema.  No arthritis, no gout  Lymphadenopathy:    He has no cervical adenopathy.  Neurological: He is alert and oriented to person, place, and time. No cranial nerve deficit.  Neurologic: Cranial nerves II-XII intact, Sensation intact to light touch/pinprick, 5/5 strength in all extremities, patient has significant expressive aphasia, only able to speak one coherent word at a time, and with great difficulty in getting out more than 2-3 words total before becoming too  frustrated to continue   Skin: Skin is warm and dry. No rash noted. No erythema.  Psychiatric:  Unable to assess due to patient condition    LABORATORY PANEL:   CBC Recent Labs  Lab 05/17/17 2241  WBC 4.9  HGB 11.0*  HCT 33.7*  PLT 231   ------------------------------------------------------------------------------------------------------------------  Chemistries  Recent Labs  Lab 05/17/17 2241  NA 138  K 3.7  CL 104  CO2 26  GLUCOSE 180*  BUN 24*  CREATININE 1.19  CALCIUM 9.2   ------------------------------------------------------------------------------------------------------------------  Cardiac Enzymes Recent Labs  Lab 05/17/17 2241  TROPONINI 0.03*   ------------------------------------------------------------------------------------------------------------------  RADIOLOGY:  Ct Head Wo Contrast  Result Date: 05/18/2017 CLINICAL DATA:  Acute onset of generalized weakness and slurred speech. EXAM: CT HEAD WITHOUT CONTRAST TECHNIQUE: Contiguous axial images were obtained from the base of the skull through the vertex without intravenous contrast. COMPARISON:  None. FINDINGS: Brain: No evidence of acute infarction, hemorrhage, hydrocephalus, extra-axial collection or mass lesion/mass effect. Prominence of the ventricles and sulci suggests mild cortical volume loss. Scattered periventricular and subcortical white matter change likely reflects small vessel ischemic microangiopathy. The brainstem and fourth ventricle are within normal limits. The basal ganglia are unremarkable in appearance. The cerebral hemispheres demonstrate grossly normal gray-white differentiation. No mass effect or midline shift is seen. Vascular: No hyperdense vessel or unexpected calcification. Skull: There is no evidence of fracture; visualized osseous structures are unremarkable in appearance. Sinuses/Orbits: The visualized portions of the orbits are within normal limits. The paranasal sinuses  and mastoid air cells are well-aerated. Other: No significant soft tissue abnormalities are seen. IMPRESSION: 1. No acute intracranial pathology seen on CT. 2. Mild cortical volume loss and scattered small vessel ischemic microangiopathy. Electronically Signed   By: Garald Balding M.D.   On: 05/18/2017 00:10    EKG:   Orders placed or performed during the hospital encounter of 05/17/17  . ED EKG  . ED EKG  . EKG 12-Lead  . EKG 12-Lead  . EKG 12-Lead  . EKG 12-Lead    IMPRESSION AND PLAN:  Principal Problem:   Aphasia -high concern for stroke as the patient's symptoms are persistent.  We will admit per stroke admission order set with appropriate imaging orders, consults, labs Active Problems:   Hypertension -hold antihypertensives for tonight, permissive hypertension with blood pressure goal less than 220/120   Diabetes mellitus without complication (HCC) -sliding scale insulin with corresponding glucose checks   CAD (coronary artery disease) -continue home meds, except for those which might lower his blood pressure as above   GERD (gastroesophageal reflux disease) -home dose PPI   Anxiety -home dose anxiolytic  All the records are reviewed and case discussed with ED provider. Management plans discussed with the patient and/or family.  DVT PROPHYLAXIS: SubQ lovenox  GI PROPHYLAXIS: PPI  ADMISSION STATUS: Observation  CODE STATUS: Full Code Status History    Date Active Date Inactive Code Status Order ID Comments User Context   03/17/2017 04:19 03/17/2017 21:09 Full Code [id number]  Saundra Shelling, MD ED   03/05/2015 00:19 03/05/2015 15:07 Full Code [id number]  Lance Coon, MD Inpatient      TOTAL TIME TAKING CARE OF THIS PATIENT: 40 minutes.   Mirai [place], Rashid Gadsden 05/18/2017, 1:00 AM  [employer]  [phone]  CC: Primary care physician; Guadalupe Maple, MD  Note:  This document was prepared using Dragon voice recognition software and may  include unintentional dictation errors.

## 2017-05-18 NOTE — Progress Notes (Signed)
PT Cancellation Note  Patient Details Name: Demitris Pokorny MRN: [id number] DOB: November 06, 1930   Cancelled Treatment:    Reason Eval/Treat Not Completed: Patient at procedure or test/unavailable.  Attempted to see pt earlier this morning but pt busy with nursing care and then going for testing.  On 2nd attempt, pt not in room (off floor for testing).  Will re-attempt PT evaluation at a later date/time.  Leitha Bleak, PT 05/18/17, 10:53 AM [phone]

## 2017-05-18 NOTE — Progress Notes (Signed)
OT Cancellation Note  Patient Details Name: Todd [name] MRN: [id number] DOB: Jan 02, 1931   Cancelled Treatment:    Reason Eval/Treat Not Completed: Patient at procedure or test/ unavailable(Initial OT eval started with personal functional history interviewed was started. Session was shortened  by nursing in to give pt. Ativan, followed by an MD visit, followed by MRI transport. Will continue monitor and intevene when available/appropriate.)  Harrel Carina, MS, OTR/L 05/18/2017, 11:08 AM

## 2017-05-18 NOTE — Progress Notes (Signed)
*  PRELIMINARY RESULTS* Echocardiogram 2D Echocardiogram has been performed.  Sherrie Sport 05/18/2017, 11:50 AM

## 2017-05-18 NOTE — Progress Notes (Signed)
PT Cancellation Note  Patient Details Name: Cullin Dishman MRN: [id number] DOB: 1930/10/25   Cancelled Treatment:    Reason Eval/Treat Not Completed: Other (comment).  Upon PT arrival, speech therapy present and reporting just starting evaluation (pt not available for PT session).  Will re-attempt PT evaluation at a later date/time.  Leitha Bleak, PT 05/18/17, 2:56 PM [phone]

## 2017-05-18 NOTE — Evaluation (Signed)
Speech Language Pathology Evaluation Patient Details Name: Todd [name] MRN: [id number] DOB: 16-May-1931 Today's Date: 05/18/2017 Time: 3295-1884 SLP Time Calculation (min) (ACUTE ONLY): 60 min  Problem List:  Patient Active Problem List   Diagnosis Date Noted  . Aphasia 05/18/2017  . CAD (coronary artery disease) 05/18/2017  . Acute CVA (cerebrovascular accident) ([place]) 05/18/2017  . Chest pain 03/17/2017  . 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 Urinary incontinence 04/27/2015  . Anxiety, generalized 04/07/2015  . History of BCG vaccination 04/07/2015  . GERD (gastroesophageal reflux disease) 03/04/2015  . Anxiety 03/04/2015  . Fatigue 03/01/2015  . Depression 02/25/2015  . Degeneration of intervertebral disc of lumbar region 02/23/2015  . Neuritis or radiculitis due to rupture of lumbar intervertebral disc 02/23/2015  . Lumbar canal stenosis 02/23/2015  . Hypertension 12/02/2014  . Diabetes mellitus without complication ([place]) 16/60/6301  . Benign essential tremor 03/13/2014  . Imbalance 03/13/2014  . CA of prostate ([place]) 10/07/2012  . Malignant neoplasm of prostate ([place]) 10/07/2012  . ED (erectile dysfunction) of organic origin 02/09/2012   Past Medical History:  Past Medical History:  Diagnosis Date  . Accelerated hypertension 03/04/2015  . Anxiety   . Aortic regurgitation   . Arthritis    osteoarthritis-left knee  . CAD (coronary artery disease)   . Depression   . Diabetes mellitus without complication ([place])   . ED (erectile dysfunction)   . GERD (gastroesophageal reflux disease)   . Glaucoma   . Hyperlipidemia   . Hypertension   . Iron deficiency anemia due to chronic blood loss   . Left heart failure ([place])   . Lumbago    Lumbosacral Neuritis  . Prostate cancer [facility])    Past Surgical History:  Past Surgical History:  Procedure Laterality Date  . CARPAL TUNNEL RELEASE    . REPLACEMENT TOTAL KNEE BILATERAL     HPI:  Todd [name]  is a 83 y.o.  male who presents with aphasia.  Patient's family states that he has been having what seems to have been stuttering symptoms for the last several weeks.  They state that he has difficulty getting his words out sometimes, and other times is able to communicate very well.  Patient came in for evaluation tonight as his symptoms got much worse today.  He is currently unable to speak much more than 2-3 words total coherently, only 1 of the time and with great effort to get out 2 or 3 total before he becomes too frustrated to continue trying.  Initial workup in the ED is largely within normal limits, but there is obviously a great concern for possible stroke.  Hospitalist were called for admission.    MRI 05/18/2017: IMPRESSION: 1. Severe Basilar Artery atherosclerosis and stenosis with RADIOGRAPHIC-STRING-SIGN stenosis of the basilar artery distal third. No intracranial artery occlusion identified. 2. 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 3. Chronic left thalamic lacunar infarct. Moderately advanced for age bilateral cerebral white matter signal changes most commonly due to chronic small vessel disease. 4. Mild to moderate left PCA P2 segment stenosis. 5. Dominant left vertebral artery, the right functionally terminates in PICA.   Assessment / Plan / Recommendation Clinical Impression  The patient is presenting with severe speech disturbance characterized by elements of aphasia, dysarthria, apraxia, and cognitive communication deficits.  The patient's family report concerns related to speech for at least a year, but today's presentation is much worse than  his baseline.  The patient was stimulable for improved confrontation naming, phrase/sentence generation, and intelligible speech.  The patient will benefit from speech therapy to maximize functional communication in his next setting.     SLP Assessment  SLP Recommendation/Assessment: All further Speech Lanaguage Pathology  needs can be addressed in the next venue of care SLP Visit Diagnosis: Apraxia (R48.2);Dysarthria and anarthria (R47.1);Aphasia (R47.01);Cognitive communication deficit (R41.841)    Follow Up Recommendations  Other (comment)(SLP in next setting)    Frequency and Duration           SLP Evaluation Cognition  Overall Cognitive Status: Difficult to assess       Comprehension  Auditory Comprehension Overall Auditory Comprehension: Impaired Yes/No Questions: Impaired Complex Questions: 50-74% accurate Commands: Impaired Two Step Basic Commands: 75-100% accurate Multistep Basic Commands: 50-74% accurate Conversation: Simple EffectiveTechniques: Extra processing time;Increased volume;Pausing;Repetition;Slowed speech;Stressing words;Visual/Gestural cues Reading Comprehension Reading Status: Unable to assess (comment)    Expression Expression Primary Mode of Expression: Verbal Verbal Expression Overall Verbal Expression: Impaired Automatic Speech: Day of week Level of Generative/Spontaneous Verbalization: Word Repetition: Impaired Level of Impairment: Word level;Phrase level Naming: Impairment Responsive: 51-75% accurate Confrontation: Impaired Convergent: 50-74% accurate Verbal Errors: Perseveration;Jargon;Not aware of errors;Neologisms   Oral / Motor  Oral Motor/Sensory Function Overall Oral Motor/Sensory Function: Generalized oral weakness Motor Speech Overall Motor Speech: Impaired Respiration: Impaired Level of Impairment: Sentence Phonation: Normal Resonance: Within functional limits Articulation: Impaired Level of Impairment: Phrase Intelligibility: Intelligibility reduced Word: 50-74% accurate Phrase: 25-49% accurate Sentence: 0-24% accurate Conversation: 0-24% accurate Motor Planning: Impaired Level of Impairment: Word Motor Speech Errors: Unaware;Inconsistent   GO                    Todd Sea, MS/CCC- SLP  Lou Miner 05/18/2017, 4:11 PM

## 2017-05-18 NOTE — Progress Notes (Signed)
[place] responded to an OR for prayer. Pt is awake and just finishing breakfast. Daughter is bedside. Howe noticed the Pt's difficulty in forming sentences. Daughter stated this has been progressive over the last couple of years. Progressed rapidly overnight. Pt will have CT and MRI. Daughter and Pt expressed frustration at his inability to express himself. CH offered that God hears and understands even if others cannot. Pt appreciated this thought. CH provided the ministry of empathetic listening and prayer.    05/18/17 1000  Clinical Encounter Type  Visited With Patient;Patient and family together  Visit Type Initial;Spiritual support  Referral From Nurse  Spiritual Encounters  Spiritual Needs Prayer;Emotional

## 2017-05-18 NOTE — Plan of Care (Signed)
  Spiritual Needs Ability to function at adequate level 05/18/2017 0407 - Progressing by Annitta Needs, RN    Health Behavior/Discharge Planning: Ability to manage health-related needs will improve 05/18/2017 0407 - Progressing by Annitta Needs, RN   Clinical Measurements: Ability to maintain clinical measurements within normal limits will improve 05/18/2017 0407 - Progressing by Annitta Needs, RN   Clinical Measurements: Will remain free from infection 05/18/2017 0407 - Progressing by Annitta Needs, RN   Clinical Measurements: Respiratory complications will improve 05/18/2017 0407 - Progressing by Annitta Needs, RN   Activity: Risk for activity intolerance will decrease 05/18/2017 0407 - Progressing by Annitta Needs, RN   Nutrition: Adequate nutrition will be maintained 05/18/2017 0407 - Progressing by Annitta Needs, RN   Ischemic Stroke/TIA Tissue Perfusion: Complications of ischemic stroke/TIA will be minimized 05/18/2017 0407 - Progressing by Annitta Needs, RN

## 2017-05-18 NOTE — Evaluation (Signed)
Physical Therapy Evaluation Patient Details Name: Todd [name] MRN: [id number] DOB: January 05, 1931 Today's Date: 05/18/2017   History of Present Illness  presented to ER with acute onset of difficulty speaking; admitted for acute CVA. MRI significant for L pontine infarct; severe basilar artery stenosis  Clinical Impression  Upon evaluation, patient alert and oriented; follows simple verbal commands, but demonstrates significant expressive language difficulties (unable to effectively communicate wants/needs).  R UE/LE with mild weakness (4- to 4/5 throughout) and delayed speed of activation/coordination of movement.  R lateral lean with all standing activities, requiring min/mod assist from therapist for midline orientation and dynamic stability.  Mod assist required for sit/stand, basic transfers and gait (50'), improving to min/mod assist with repetition.  Do recommend continued use of RW and +1 assist for all mobility at this time due to balance deficits and associated fall risk.   Would benefit from skilled PT to address above deficits and promote optimal return to PLOF; recommend transition to STR upon discharge from acute hospitalization.     Follow Up Recommendations SNF    Equipment Recommendations  Rolling walker with 5" wheels;3in1 (PT)    Recommendations for Other Services       Precautions / Restrictions Precautions Precautions: Fall Restrictions Weight Bearing Restrictions: No      Mobility  Bed Mobility               General bed mobility comments: seated in recliner beginning/end of treatment session  Transfers Overall transfer level: Needs assistance Equipment used: Rolling walker (2 wheeled) Transfers: Sit to/from Stand Sit to Stand: Mod assist         General transfer comment: extensive assist for lift off from seating surface; tends to 'walk' LEs back under COG once buttocks lifted from seating surface  Ambulation/Gait Ambulation/Gait assistance: Min  assist;Mod assist Ambulation Distance (Feet): 60 Feet Assistive device: Rolling walker (2 wheeled)     Gait velocity interpretation: Below normal speed for age/gender General Gait Details: reciprocal stepping pattern with excessive weight shift/lean towards R, min/mod manual faciltiation for L ant/lateral weight shift, R foot clearance and step height/length.  Poor balance reactions. Unsafe to attempt without RW and +1 assist  Stairs            Wheelchair Mobility    Modified Rankin (Stroke Patients Only)       Balance Overall balance assessment: Needs assistance Sitting-balance support: No upper extremity supported;Feet supported Sitting balance-Leahy Scale: Good     Standing balance support: Bilateral upper extremity supported Standing balance-Leahy Scale: Poor                               Pertinent Vitals/Pain Pain Assessment: No/denies pain    Home Living Family/patient expects to be discharged to:: Private residence Living Arrangements: Children(son and daughter in [employer]) Available Help at Discharge: Family Type of Home: House Home Access: Stairs to enter   Technical brewer of Steps: 1 Home Layout: One level Home Equipment: Cane - single point Additional Comments: Social history obtained from daughter at bedside; patient unable to effectively communicate    Prior Function Level of Independence: Independent with assistive device(s)         Comments: Mod indep with SPC for ADLs, household and limited community mobility; daughter reports at least 2 falls in previous year.     Hand Dominance        Extremity/Trunk Assessment   Upper Extremity Assessment Upper Extremity Assessment: (  R UE grossly 4/5, L UE 5/5.  Delayed coordination, speed of movement throughout R UE; mild dysmetria with finger/nose)    Lower Extremity Assessment Lower Extremity Assessment: (R LE grossly 4/5, L LE 5/5.  Mild foot drag, impaired coordination noted  with functional activities)  Bilat knees with limited flexion ROM (history of bilat TKR, >10 years prior)       Communication   Communication: Expressive difficulties(consistently follows all verbal commands; unable to expressive communicate beyond 1-word answers with significant difficulty.  Inconsistent and unreliable yes/no)  Cognition Arousal/Alertness: Awake/alert Behavior During Therapy: WFL for tasks assessed/performed Overall Cognitive Status: Difficult to assess                                        General Comments      Exercises Other Exercises Other Exercises: Sit/stand for peri-care and lower body clothing management, min/mod assist for sit/stand (performance improved with patient allowed time, space to do in his preferred movement pattern) and static standing balance   Assessment/Plan    PT Assessment Patient needs continued PT services  PT Problem List Decreased strength;Decreased range of motion;Decreased activity tolerance;Decreased balance;Decreased mobility;Decreased coordination;Decreased knowledge of use of DME;Decreased cognition;Decreased safety awareness;Decreased knowledge of precautions       PT Treatment Interventions DME instruction;Gait training;Stair training;Functional mobility training;Therapeutic exercise;Therapeutic activities;Balance training;Neuromuscular re-education;Cognitive remediation;Patient/family education    PT Goals (Current goals can be found in the Care Plan section)  Acute Rehab PT Goals PT Goal Formulation: Patient unable to participate in goal setting Time For Goal Achievement: 06/01/17 Potential to Achieve Goals: Good    Frequency 7X/week   Barriers to discharge        Co-evaluation               AM-PAC PT "6 Clicks" Daily Activity  Outcome Measure Difficulty turning over in bed (including adjusting bedclothes, sheets and blankets)?: Unable Difficulty moving from lying on back to sitting on the  side of the bed? : Unable Difficulty sitting down on and standing up from a chair with arms (e.g., wheelchair, bedside commode, etc,.)?: Unable Help needed moving to and from a bed to chair (including a wheelchair)?: A Lot Help needed walking in hospital room?: A Lot Help needed climbing 3-5 steps with a railing? : A Lot 6 Click Score: 9    End of Session Equipment Utilized During Treatment: Gait belt Activity Tolerance: Patient tolerated treatment well Patient left: in chair;with call bell/phone within reach;with chair alarm set;with family/visitor present Nurse Communication: Mobility status PT Visit Diagnosis: Muscle weakness (generalized) (M62.81);Difficulty in walking, not elsewhere classified (R26.2);Hemiplegia and hemiparesis Hemiplegia - Right/Left: Right Hemiplegia - dominant/non-dominant: Dominant Hemiplegia - caused by: Cerebral infarction    Time: 9678-9381 PT Time Calculation (min) (ACUTE ONLY): 27 min   Charges:   PT Evaluation $PT Eval Moderate Complexity: 1 Mod PT Treatments $Therapeutic Activity: 8-22 mins   PT G Codes:   PT G-Codes **NOT FOR INPATIENT CLASS** Functional Assessment Tool Used: AM-PAC 6 Clicks Basic Mobility Functional Limitation: Mobility: Walking and moving around Mobility: Walking and Moving Around Current Status (O1751): At least 60 percent but less than 80 percent impaired, limited or restricted Mobility: Walking and Moving Around Goal Status [phone]): At least 20 percent but less than 40 percent impaired, limited or restricted   Markeith Jue H. Owens Shark, PT, DPT, NCS 05/18/17, 4:48 PM [phone]

## 2017-05-18 NOTE — Consult Note (Signed)
Referring Physician: Vianne Bulls    Chief Complaint: Difficulty with speech  HPI: Todd [name] is an 83 y.o. male who per report of his daughters has had speech problems for the past couple of years.  Has had difficulty getting to see a speech therapist.  Patient's family states that he has been having what seems to have been stuttering symptoms for the last several weeks.  They state that he has difficulty getting his words out sometimes, and other times is able to communicate very well.  Patient came in for evaluation yesterday as his symptoms got much worse.  He was unable to speak much more than 2-3 words and was becoming very frustrated. Initial NIHSS of 6.  Date last known well: Unable to determine Time last known well: Unable to determine tPA Given: No: Unable to determine LKW  Past Medical History:  Diagnosis Date  . Accelerated hypertension 03/04/2015  . Anxiety   . Aortic regurgitation   . Arthritis    osteoarthritis-left knee  . CAD (coronary artery disease)   . Depression   . Diabetes mellitus without complication ([place])   . ED (erectile dysfunction)   . GERD (gastroesophageal reflux disease)   . Glaucoma   . Hyperlipidemia   . Hypertension   . Iron deficiency anemia due to chronic blood loss   . Left heart failure ([place])   . Lumbago    Lumbosacral Neuritis  . Prostate cancer [facility])     Past Surgical History:  Procedure Laterality Date  . CARPAL TUNNEL RELEASE    . REPLACEMENT TOTAL KNEE BILATERAL      Family History  Problem Relation Age of Onset  . Stroke Mother   . Hypertension Mother   . Hypertension Father   . Stroke Sister   . Prostate cancer Neg Hx   . Kidney cancer Neg Hx   . Bladder Cancer Neg Hx    Social History:  reports that he quit smoking about 22 years ago. he has never used smokeless tobacco. He reports that he drinks about 0.6 oz of alcohol per week. He reports that he does not use drugs.  Allergies:  Allergies  Allergen Reactions  .  Fluoxetine Other (See Comments)    headache  . Meloxicam Other (See Comments)    Other reaction(s): Dizziness Nervousness.     Medications:  I have reviewed the patient's current medications. Prior to Admission:  Medications Prior to Admission  Medication Sig Dispense Refill Last Dose  . amLODipine (NORVASC) 10 MG tablet Take 1 tablet (10 mg total) by mouth daily. (Patient taking differently: Take 5 mg by mouth daily. ) 30 tablet 11 05/17/2017 at Unknown time  . aspirin EC 81 MG tablet Take 81 mg by mouth daily.   05/17/2017 at Unknown time  . atorvastatin (LIPITOR) 10 MG tablet Take 1 tablet (10 mg total) by mouth daily. 90 tablet 3 05/17/2017 at Unknown time  . busPIRone (BUSPAR) 10 MG tablet Take 10 mg by mouth at bedtime.    05/17/2017 at Unknown time  . clonazePAM (KLONOPIN) 0.5 MG tablet Take 1 tablet (0.5 mg total) by mouth daily as needed for anxiety. 30 tablet 5 prn at prn  . DULoxetine (CYMBALTA) 20 MG capsule Take 1 capsule (20 mg total) by mouth daily. (Patient taking differently: Take 20 mg by mouth at bedtime. ) 90 capsule 4 05/17/2017 at Unknown time  . gabapentin (NEURONTIN) 100 MG capsule Take 100 mg by mouth 2 (two) times daily.    05/17/2017  at Unknown time  . latanoprost (XALATAN) 0.005 % ophthalmic solution Place 1 drop into both eyes at bedtime.    Past Week at Unknown time  . metFORMIN (GLUCOPHAGE) 500 MG tablet Take 1 tablet (500 mg total) by mouth daily with breakfast. 180 tablet 4 05/17/2017 at Unknown time  . pantoprazole (PROTONIX) 40 MG tablet Take 1 tablet (40 mg total) by mouth daily. 90 tablet 3 05/17/2017 at Unknown time  . propranolol ER (INDERAL LA) 80 MG 24 hr capsule Take 1 capsule (80 mg total) by mouth daily. 90 capsule 4 05/17/2017 at Unknown time  . telmisartan (MICARDIS) 80 MG tablet Take 1 tablet (80 mg total) by mouth daily. 90 tablet 4 05/17/2017 at Unknown time   Scheduled: . aspirin EC  81 mg Oral Daily  . atorvastatin  10 mg Oral Daily  .  busPIRone  10 mg Oral Daily  . DULoxetine  20 mg Oral Daily  . enoxaparin (LOVENOX) injection  40 mg Subcutaneous Q24H  . gabapentin  100 mg Oral BID  . insulin aspart  0-5 Units Subcutaneous QHS  . insulin aspart  0-9 Units Subcutaneous TID WC  . pantoprazole  40 mg Oral Daily    ROS: Unable to provide due to mental status   Physical Examination: Blood pressure 125/75, pulse (!) 54, temperature 98.3 F (36.8 C), temperature source Oral, resp. rate 16, height 5\' 6"  (1.676 m), weight 78.5 kg (173 lb), SpO2 99 %.  Gen: NAD HEENT-  Normocephalic, no lesions, without obvious abnormality.  Normal external eye and conjunctiva.  Normal TM's bilaterally.  Normal auditory canals and external ears. Normal external nose, mucus membranes and septum.  Normal pharynx. Cardiovascular- S1, S2 normal, pulses palpable throughout   Lungs- chest clear, no wheezing, rales, normal symmetric air entry Abdomen- soft, non-tender; bowel sounds normal; no masses,  no organomegaly Extremities- no edema Lymph-no adenopathy palpable Musculoskeletal-no joint tenderness, deformity or swelling Skin-warm and dry, no hyperpigmentation, vitiligo, or suspicious lesions  Neurological Examination   Mental Status: Alert.  Only responds "yes" to questions.  Only able to follow commands with nonverbal prompts.   Cranial Nerves: II: Discs flat bilaterally; Blinks to bilateral confrontation, pupils equal, round, reactive to light and accommodation III,IV, VI: ptosis not present, extra-ocular motions intact bilaterally V,VII: right facial droop, facial light touch sensation normal bilaterally VIII: hearing normal bilaterally IX,X: gag reflex present XI: bilateral shoulder shrug XII: midline tongue extension Motor: 4+-5-/5 strength on the right.  Patient 5/5 on the left Sensory: Light touch sensation intact throughout, bilaterally Deep Tendon Reflexes: 2+ in the upper extremities and absent in the lower  extremities Plantars: Right: upgoing   Left: upgoing Cerebellar: Normal finger-to-nose and normal heel-to-shin testing bilaterally Gait: not tested due to safety concerns  Laboratory Studies:  Basic Metabolic Panel: Recent Labs  Lab 05/17/17 2241 05/18/17 0407  NA 138  --   K 3.7  --   CL 104  --   CO2 26  --   GLUCOSE 180*  --   BUN 24*  --   CREATININE 1.19 1.00  CALCIUM 9.2  --     Liver Function Tests: No results for input(s): AST, ALT, ALKPHOS, BILITOT, PROT, ALBUMIN in the last 168 hours. No results for input(s): LIPASE, AMYLASE in the last 168 hours. No results for input(s): AMMONIA in the last 168 hours.  CBC: Recent Labs  Lab 05/17/17 2241 05/18/17 0407  WBC 4.9 4.8  HGB 11.0* 10.2*  HCT 33.7* 30.6*  MCV 94.3 94.2  PLT 231 198    Cardiac Enzymes: Recent Labs  Lab 05/17/17 2241 05/18/17 0156  TROPONINI 0.03* 0.03*    BNP: Invalid input(s): POCBNP  CBG: Recent Labs  Lab 05/18/17 0250 05/18/17 0743 05/18/17 1223  GLUCAP 124* 122* 152*    Microbiology: Results for orders placed or performed in visit on 01/09/17  Microscopic Examination     Status: Abnormal   Collection Time: 01/09/17  1:59 PM  Result Value Ref Range Status   WBC, UA 0-5 0 - 5 /hpf Final   RBC, UA 0-2 0 - 2 /hpf Final   Epithelial Cells (non renal) 0-10 0 - 10 /hpf Final   Renal Epithel, UA 0-10 (A) None seen /hpf Final   Mucus, UA Present (A) Not Estab. Final   Bacteria, UA None seen None seen/Few Final    Coagulation Studies: No results for input(s): LABPROT, INR in the last 72 hours.  Urinalysis:  Recent Labs  Lab 05/18/17 0156  COLORURINE YELLOW*  LABSPEC 1.015  PHURINE 5.0  GLUCOSEU NEGATIVE  HGBUR NEGATIVE  BILIRUBINUR NEGATIVE  KETONESUR NEGATIVE  PROTEINUR NEGATIVE  NITRITE NEGATIVE  LEUKOCYTESUR NEGATIVE    Lipid Panel:    Component Value Date/Time   CHOL 122 05/18/2017 0407   CHOL 190 12/05/2016 1428   TRIG 97 05/18/2017 0407   HDL 36 (L)  05/18/2017 0407   HDL 47 12/05/2016 1428   CHOLHDL 3.4 05/18/2017 0407   VLDL 19 05/18/2017 0407   LDLCALC 67 05/18/2017 0407   LDLCALC 121 (H) 12/05/2016 1428    HgbA1C:  Lab Results  Component Value Date   HGBA1C 6.2 (H) 03/05/2015    Urine Drug Screen:  No results found for: LABOPIA, COCAINSCRNUR, LABBENZ, AMPHETMU, THCU, LABBARB  Alcohol Level: No results for input(s): ETH in the last 168 hours.  Other results: EKG: sinus or ectopic rhythm at 65 bpm.  Imaging: Ct Head Wo Contrast  Result Date: 05/18/2017 CLINICAL DATA:  Acute onset of generalized weakness and slurred speech. EXAM: CT HEAD WITHOUT CONTRAST TECHNIQUE: Contiguous axial images were obtained from the base of the skull through the vertex without intravenous contrast. COMPARISON:  None. FINDINGS: Brain: No evidence of acute infarction, hemorrhage, hydrocephalus, extra-axial collection or mass lesion/mass effect. Prominence of the ventricles and sulci suggests mild cortical volume loss. Scattered periventricular and subcortical white matter change likely reflects small vessel ischemic microangiopathy. The brainstem and fourth ventricle are within normal limits. The basal ganglia are unremarkable in appearance. The cerebral hemispheres demonstrate grossly normal gray-white differentiation. No mass effect or midline shift is seen. Vascular: No hyperdense vessel or unexpected calcification. Skull: There is no evidence of fracture; visualized osseous structures are unremarkable in appearance. Sinuses/Orbits: The visualized portions of the orbits are within normal limits. The paranasal sinuses and mastoid air cells are well-aerated. Other: No significant soft tissue abnormalities are seen. IMPRESSION: 1. No acute intracranial pathology seen on CT. 2. Mild cortical volume loss and scattered small vessel ischemic microangiopathy. Electronically Signed   By: Garald Balding M.D.   On: 05/18/2017 00:10   Mr Brain Wo Contrast  Result  Date: 05/18/2017 CLINICAL DATA:  83 year old male with abnormal speech including stuttering for several weeks. Progressive symptoms. EXAM: MRI HEAD WITHOUT CONTRAST MRA HEAD WITHOUT CONTRAST TECHNIQUE: Multiplanar, multiecho pulse sequences of the brain and surrounding structures were obtained without intravenous contrast. Angiographic images of the head were obtained using MRA technique without contrast. COMPARISON:  Head CT without contrast 05/17/2017. FINDINGS: MRI  HEAD FINDINGS Brain: Confluent wedge-shaped 2 cm area of restricted diffusion in the left pons (series 9, image 19) is associated with T2 and FLAIR hyperintensity, and mild T1 hypointensity. No associated hemorrhage or mass effect. No other restricted diffusion or acute infarct. Chronic lacunar infarct in the central left thalamus. Patchy and confluent bilateral cerebral white matter T2 and FLAIR hyperintensity. No cortical encephalomalacia or chronic cerebral blood products identified. No midline shift, mass effect, evidence of mass lesion, ventriculomegaly, extra-axial collection or acute intracranial hemorrhage. Cervicomedullary junction and pituitary are within normal limits. Vascular: Major intracranial vascular flow voids are preserved, the distal left vertebral artery appears dominant. Skull and upper cervical spine: Degenerative ligamentous hypertrophy about the odontoid. Partially visible advanced chronic disc and endplate degeneration at C3-C4. Upper cervical degenerative spinal stenosis suspected. Normal bone marrow signal. Sinuses/Orbits: Postoperative changes to both globes, otherwise normal orbits soft tissues. Trace paranasal sinus mucosal thickening. Other: Mastoid air cells are clear. Negative orbit and scalp soft tissues. MRA HEAD FINDINGS Antegrade flow in the posterior circulation with dominant distal left vertebral artery. There is flow signal in the distal right vertebral artery which appears to functionally terminates in PICA.  Patent left PICA origin. Patent basilar artery with moderate proximal and severe distal basilar stenosis. See series 23, image 9. The high-grade distal basilar stenosis proximal to the SCA and PCA origins is string sign type. The distal basilar remains patent. Fetal type PCA origins, more so the right. SCAs and PCAs are patent. There is mild to moderate irregularity and stenosis of the left PCA P2 segment. Distal PCA branches are within normal limits. Antegrade flow in both ICA siphons. Siphon irregularity without significant stenosis. Ophthalmic and posterior communicating artery origins are normal. Patent carotid termini. Normal MCA and ACA origins. Tortuous bilateral ACA is. Anterior communicating artery within normal limits. Possible median artery of the corpus callosum. The left ACA is mildly dominant. Bilateral MCA M1 segments are mildly tortuous. Bilateral M1 segments and MCA bifurcations are patent. Visualized bilateral MCA branches are within normal limits. IMPRESSION: 1. Severe Basilar Artery atherosclerosis and stenosis with RADIOGRAPHIC-STRING-SIGN stenosis of the basilar artery distal third. No intracranial artery occlusion identified. 2. 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 3. Chronic left thalamic lacunar infarct. Moderately advanced for age bilateral cerebral white matter signal changes most commonly due to chronic small vessel disease. 4. Mild to moderate left PCA P2 segment stenosis. 5. Dominant left vertebral artery, the right functionally terminates in PICA. Electronically Signed   By: Genevie Ann M.D.   On: 05/18/2017 11:02   US Carotid Bilateral (at [facility] Only)  Result Date: 05/18/2017 CLINICAL DATA:  Acute left pontine infarct EXAM: BILATERAL CAROTID DUPLEX ULTRASOUND TECHNIQUE: Pearline Cables scale imaging, color Doppler and duplex ultrasound were performed of bilateral carotid and  vertebral arteries in the neck. COMPARISON:  05/18/2017 FINDINGS: Criteria: Quantification of carotid stenosis is based on velocity parameters that correlate the residual internal carotid diameter with NASCET-based stenosis levels, using the diameter of the distal internal carotid lumen as the denominator for stenosis measurement. The following velocity measurements were obtained: RIGHT ICA:  89/14 cm/sec CCA:  08/14 cm/sec SYSTOLIC ICA/CCA RATIO:  1.1 DIASTOLIC ICA/CCA RATIO:  1.2 ECA:  90 cm/sec LEFT ICA:  97/23 cm/sec CCA:  48/18 cm/sec SYSTOLIC ICA/CCA RATIO:  1.0 DIASTOLIC ICA/CCA RATIO:  2.5 ECA:  66 cm/sec RIGHT CAROTID ARTERY: Mild intimal thickening and hypoechoic carotid bifurcation  atherosclerosis. Slight tortuosity noted. Despite this, there is no hemodynamically significant right ICA stenosis, velocity elevation, or turbulent flow. RIGHT VERTEBRAL ARTERY:  Antegrade LEFT CAROTID ARTERY: Similar mild intimal thickening and atherosclerotic plaque formation. No hemodynamically significant left ICA stenosis, velocity elevation, or turbulent flow. LEFT VERTEBRAL ARTERY:  Antegrade IMPRESSION: Mild bilateral carotid atherosclerosis. No hemodynamically significant ICA stenosis. Degree of narrowing less than 50% bilaterally by ultrasound criteria. Patent antegrade vertebral flow bilaterally Electronically Signed   By: Jerilynn Mages.  Shick M.D.   On: 05/18/2017 11:37   Mr Jodene Nam Head/brain JO Cm  Result Date: 05/18/2017 CLINICAL DATA:  83 year old male with abnormal speech including stuttering for several weeks. Progressive symptoms. EXAM: MRI HEAD WITHOUT CONTRAST MRA HEAD WITHOUT CONTRAST TECHNIQUE: Multiplanar, multiecho pulse sequences of the brain and surrounding structures were obtained without intravenous contrast. Angiographic images of the head were obtained using MRA technique without contrast. COMPARISON:  Head CT without contrast 05/17/2017. FINDINGS: MRI HEAD FINDINGS Brain: Confluent wedge-shaped 2 cm area  of restricted diffusion in the left pons (series 9, image 19) is associated with T2 and FLAIR hyperintensity, and mild T1 hypointensity. No associated hemorrhage or mass effect. No other restricted diffusion or acute infarct. Chronic lacunar infarct in the central left thalamus. Patchy and confluent bilateral cerebral white matter T2 and FLAIR hyperintensity. No cortical encephalomalacia or chronic cerebral blood products identified. No midline shift, mass effect, evidence of mass lesion, ventriculomegaly, extra-axial collection or acute intracranial hemorrhage. Cervicomedullary junction and pituitary are within normal limits. Vascular: Major intracranial vascular flow voids are preserved, the distal left vertebral artery appears dominant. Skull and upper cervical spine: Degenerative ligamentous hypertrophy about the odontoid. Partially visible advanced chronic disc and endplate degeneration at C3-C4. Upper cervical degenerative spinal stenosis suspected. Normal bone marrow signal. Sinuses/Orbits: Postoperative changes to both globes, otherwise normal orbits soft tissues. Trace paranasal sinus mucosal thickening. Other: Mastoid air cells are clear. Negative orbit and scalp soft tissues. MRA HEAD FINDINGS Antegrade flow in the posterior circulation with dominant distal left vertebral artery. There is flow signal in the distal right vertebral artery which appears to functionally terminates in PICA. Patent left PICA origin. Patent basilar artery with moderate proximal and severe distal basilar stenosis. See series 23, image 9. The high-grade distal basilar stenosis proximal to the SCA and PCA origins is string sign type. The distal basilar remains patent. Fetal type PCA origins, more so the right. SCAs and PCAs are patent. There is mild to moderate irregularity and stenosis of the left PCA P2 segment. Distal PCA branches are within normal limits. Antegrade flow in both ICA siphons. Siphon irregularity without  significant stenosis. Ophthalmic and posterior communicating artery origins are normal. Patent carotid termini. Normal MCA and ACA origins. Tortuous bilateral ACA is. Anterior communicating artery within normal limits. Possible median artery of the corpus callosum. The left ACA is mildly dominant. Bilateral MCA M1 segments are mildly tortuous. Bilateral M1 segments and MCA bifurcations are patent. Visualized bilateral MCA branches are within normal limits. IMPRESSION: 1. Severe Basilar Artery atherosclerosis and stenosis with RADIOGRAPHIC-STRING-SIGN stenosis of the basilar artery distal third. No intracranial artery occlusion identified. 2. 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 3. Chronic left thalamic lacunar infarct. Moderately advanced for age bilateral cerebral white matter signal changes most commonly due to chronic small vessel disease. 4. Mild to moderate left PCA P2 segment stenosis. 5. Dominant left vertebral artery, the right functionally terminates in PICA.  Electronically Signed   By: Genevie Ann M.D.   On: 05/18/2017 11:02    Assessment: 83 y.o. male presenting with complaints of speech.  Noted to have some right sided weakness on neurological examination as well.  MRI of the brain reviewed and shows an acute left pontine infarct and chronic left thalamic infarct.  Small vessel disease likely etiology.  String sign noted in the distal third of the basilar on MRA.  Carotid dopplers show no evidence of hemodynamically significant stenosis.  Echocardiogram pending.  A1c pending (normal one month ago), LDL 67.  Patient on ASA at home.    Stroke Risk Factors - diabetes mellitus, hyperlipidemia and hypertension  Plan: 1. HgbA1c pending 2. Vascular consult 3. PT consult, OT consult, Speech consult 4. Echocardiogram pendng 5. Prophylactic therapy-ASA 81mg  and Plavix 75mg  daily 6. Telemetry  monitoring 7. Frequent neuro checks   Alexis Goodell, MD Neurology [phone] 05/18/2017, 2:00 PM

## 2017-05-19 DIAGNOSIS — R4701 Aphasia: Secondary | ICD-10-CM

## 2017-05-19 LAB — GLUCOSE, CAPILLARY
Glucose-Capillary: 126 mg/dL — ABNORMAL HIGH (ref 65–99)
Glucose-Capillary: 169 mg/dL — ABNORMAL HIGH (ref 65–99)
Glucose-Capillary: 156 mg/dL — ABNORMAL HIGH (ref 65–99)
Glucose-Capillary: 137 mg/dL — ABNORMAL HIGH (ref 65–99)

## 2017-05-19 LAB — HEMOGLOBIN A1C
Hgb A1c MFr Bld: 6.3 % — ABNORMAL HIGH (ref 4.8–5.6)
Mean Plasma Glucose: 134 mg/dL

## 2017-05-19 MED ORDER — CLONAZEPAM 0.5 MG PO TABS
0.5000 mg | ORAL_TABLET | Freq: Two times a day (BID) | ORAL | Status: DC
  Administered 2017-05-20 – 2017-05-21 (×3): 0.5 mg via ORAL
  Filled 2017-05-19 (×4): qty 1

## 2017-05-19 MED ORDER — CLOPIDOGREL BISULFATE 75 MG PO TABS
75.0000 mg | ORAL_TABLET | Freq: Every day | ORAL | Status: DC
  Administered 2017-05-19 – 2017-05-21 (×3): 75 mg via ORAL
  Filled 2017-05-19 (×3): qty 1

## 2017-05-19 NOTE — Clinical Social Work Note (Signed)
CSW received consult for discharge planning. CSW will assess when able.  Teagyn Fishel Martha Tehila Sokolow, MSW, LCSWA [phone] 

## 2017-05-19 NOTE — Clinical Social Work Note (Signed)
Clinical Social Work Assessment  Patient Details  Name: Todd [name] MRN: [id number] Date of Birth: 1931-05-22  Date of referral:  05/19/17               Reason for consult:  Facility Placement                Permission sought to share information with:  Chartered certified accountant granted to share information::  Yes, Verbal Permission Granted  Name::        Agency::     Relationship::     Contact Information:     Housing/Transportation Living arrangements for the past 2 months:  Single Family Home Source of Information:  (S) Patient, Medical Team, Other (Comment Required), Adult Children(Extended family) Patient Interpreter Needed:  None Criminal Activity/Legal Involvement Pertinent to Current Situation/Hospitalization:  No - Comment as needed Significant Relationships:  Adult Children, Warehouse manager, Other Family Members, [place] Lives with:    Do you feel safe going back to the place where you live?  Yes Need for family participation in patient care:  No (Coment)  Care giving concerns:  PT recommendation for STR   Social Worker assessment / plan:  CSW met with the patient and extended family at bedside to discuss discharge planning. The patient gave verbal permission to conduct bed search and gave permission to contact his daughter Todd [name] to discuss further. The CSW contacted [place] by phone, and she also gave permission to pursue STR and named [facility] as the preference.  The CSW has begun the referral process and will follow up with bed offers as they are available. The patient will most likely discharge Monday. CSW will facilitate discharge. Patient will most likely need a WC van for transport.  Employment status:  Retired Nurse, adult PT Recommendations:  [place] / Referral to community resources:  [place]  Patient/Family's Response to care: The patient and family thanked the CSW  for care.  Patient/Family's Understanding of and Emotional Response to Diagnosis, Current Treatment, and Prognosis:  The patient and his family understand the recommendation for STR and are in agreement with the plan. They are aware that insurance will have to authorize such.  Emotional Assessment Appearance:  Appears stated age Attitude/Demeanor/Rapport:  (Pleasant and alert) Affect (typically observed):  Accepting, Appropriate, Pleasant Orientation:  Oriented to Self, Oriented to Place, Oriented to Situation Alcohol / Substance use:  Never Used Psych involvement (Current and /or in the community):  No (Comment)  Discharge Needs  Concerns to be addressed:  Discharge Planning Concerns, Care Coordination Readmission within the last 30 days:  No Current discharge risk:  Chronically ill Barriers to Discharge:  Continued Medical Work up   [employer], LCSW 05/19/2017, 3:16 PM

## 2017-05-19 NOTE — Evaluation (Signed)
Occupational Therapy Evaluation Patient Details Name: Todd [name] MRN: [id number] DOB: 08-29-1930 Today's Date: 05/19/2017    History of Present Illness presented to ER with acute onset of difficulty speaking; admitted for acute CVA. MRI significant for L pontine infarct; severe basilar artery stenosis   Clinical Impression   Patient was sitting up in recliner when OT arrived, daughter at bedside. Patient had recently worked with PT per daughter. Patient was pleasant and attempted to speak with OT. Was able to provide some history with limited choices provided to choose from. Daughter able to verify information. PLOF: was able to dress, toilet, ambulate with cane, and perform sponge baths independently. Appears able to understand simple commands, but difficulty with verbal expression. Patient attempted to use hand signals to help with communication. Patient eager to perform tasks with OT. Noted slight weakness in RUE vs LUE, and mild coordination difficulties during formal testing. Coordination did not appear to impact functional performance of task. Able to donn/doff both socks from seated position with extra time. Able to manipulate objects at table level. Able to stand with MIN to CGA from recliner to rolling walker level, but right lateral lean during standing required MIN A. Patient attempted to self correct position, but has a forward hunched position due to back problems. Is aware of right lean but difficulty maintaining during tasks. OT would recommend further treatment for improved balance and safety during ADL tasks.     Follow Up Recommendations  SNF    Equipment Recommendations       Recommendations for Other Services       Precautions / Restrictions Precautions Precautions: Fall Restrictions Weight Bearing Restrictions: No      Mobility Bed Mobility Overal bed mobility: Needs Assistance Bed Mobility: Supine to Sit     Supine to sit: Min assist         Transfers Overall transfer level: Needs assistance Equipment used: Rolling walker (2 wheeled) Transfers: Sit to/from Stand Sit to Stand: Min assist              Balance Overall balance assessment: Needs assistance Sitting-balance support: No upper extremity supported;Feet supported Sitting balance-Leahy Scale: Good     Standing balance support: Bilateral upper extremity supported Standing balance-Leahy Scale: Poor                             ADL either performed or assessed with clinical judgement   ADL Overall ADL's : Needs assistance/impaired                     Lower Body Dressing: Minimal assistance Lower Body Dressing Details (indicate cue type and reason): able to donn/doff socks from sitting with extra time and supervision for safety. Standing needs assist due to right lateral lean. Toilet Transfer: Minimal Patent examiner Details (indicate cue type and reason): lateral lean to the right when standing. Toileting- Clothing Manipulation and Hygiene: Minimal assistance Toileting - Clothing Manipulation Details (indicate cue type and reason): Patient needed assist with pulling items up on the right side while maintaining balance due to right lateral lean     Functional mobility during ADLs: Minimal assistance;Rolling walker General ADL Comments: Patient is able to perform basic ADL tasks with MIN A for standing due to right lateral lean.     Vision Patient Visual Report: No change from baseline       Perception     Praxis  Pertinent Vitals/Pain Pain Assessment: No/denies pain     Hand Dominance Left   Extremity/Trunk Assessment Upper Extremity Assessment Upper Extremity Assessment: Overall WFL for tasks assessed(very mild weakness in RUE vs LUE. Slight coordination deficits, but doesn't affect during functional tasks.)   Lower Extremity Assessment Lower Extremity Assessment: Defer to PT evaluation        Communication Communication Communication: Expressive difficulties   Cognition Arousal/Alertness: Awake/alert Behavior During Therapy: WFL for tasks assessed/performed Overall Cognitive Status: Difficult to assess                                     General Comments       Exercises     Shoulder Instructions      Home Living Family/patient expects to be discharged to:: Private residence Living Arrangements: Children(son and daughter-in-law) Available Help at Discharge: Family Type of Home: House Home Access: Stairs to enter;Ramped entrance(ramp with stair at the top) Entrance Stairs-Number of Steps: 1   Home Layout: One level               Home Equipment: Cane - single point   Additional Comments: Patient attempted to communicate history. With limited choice was able to communicate some. Daughter was able to confirm information.      Prior Functioning/Environment Level of Independence: Independent with assistive device(s)        Comments: Mod indep with SPC for ADLs, performs sponge baths, and dresses self independently. Performs light housekeeping in his area, but family performs cooking and most cleaning        OT Problem List: Decreased strength;Decreased activity tolerance;Impaired balance (sitting and/or standing);Decreased knowledge of use of DME or AE      OT Treatment/Interventions: Self-care/ADL training;Neuromuscular education;DME and/or AE instruction;Therapeutic activities;Therapeutic exercise;Patient/family education;Balance training    OT Goals(Current goals can be found in the care plan section) Acute Rehab OT Goals Patient Stated Goal: Patient unable to verbalize goal, but daughter stated they just wanted him to be safe at home. OT Goal Formulation: With patient/family Time For Goal Achievement: 06/02/17 Potential to Achieve Goals: Good  OT Frequency: Min 2X/week   Barriers to D/C: Decreased caregiver support           Co-evaluation              AM-PAC PT "6 Clicks" Daily Activity     Outcome Measure Help from another person eating meals?: None Help from another person taking care of personal grooming?: A Little Help from another person toileting, which includes using toliet, bedpan, or urinal?: A Little Help from another person bathing (including washing, rinsing, drying)?: A Little Help from another person to put on and taking off regular upper body clothing?: None Help from another person to put on and taking off regular lower body clothing?: A Little 6 Click Score: 20   End of Session Equipment Utilized During Treatment: Gait belt;Rolling walker  Activity Tolerance: Patient tolerated treatment well Patient left: in chair;with call bell/phone within reach;with bed alarm set;with family/visitor present  OT Visit Diagnosis: Unsteadiness on feet (R26.81);Muscle weakness (generalized) (M62.81)                Time: 0947-0962 OT Time Calculation (min): 34 min Charges:  OT General Charges $OT Visit: 1 Visit OT Evaluation $OT Eval Low Complexity: 1 Low OT Treatments $Self Care/Home Management : 8-22 mins G-Codes:     Amie [place], OTR/L  Todd [name] L 05/19/2017, 1:49 PM

## 2017-05-19 NOTE — Progress Notes (Signed)
[facility] [place] NAME: Todd [name]    MR#:  [id number]  DATE OF BIRTH:  March 24, 1931  SUBJECTIVE: he is admitted for worsening speech difficulties, MRI of the brain confirmed acute to subacute left pontine stroke.  According to family patient is having expressive aphasia for long time but it has gotten worse and he is now not even able to communicate.  Patient NIH stroke scale 6.  CHIEF COMPLAINT:   Chief Complaint  Patient presents with  . Weakness    REVIEW OF SYSTEMS:   ROS CONSTITUTIONAL: No fever, fatigue or weakness.  He is trying to get the words out but not able to talk. EYES: No blurred or double vision.  EARS, NOSE, AND THROAT: No tinnitus or ear pain.  RESPIRATORY: No cough, shortness of breath, wheezing or hemoptysis.  CARDIOVASCULAR: No chest pain, orthopnea, edema.  GASTROINTESTINAL: No nausea, vomiting, diarrhea or abdominal pain.  GENITOURINARY: No dysuria, hematuria.  ENDOCRINE: No polyuria, nocturia,  HEMATOLOGY: No anemia, easy bruising or bleeding SKIN: No rash or lesion. MUSCULOSKELETAL: No joint pain or arthritis.   NEUROLOGIC: No tingling, numbness, weakness.  PSYCHIATRY: No anxiety or depression.   DRUG ALLERGIES:   Allergies  Allergen Reactions  . Fluoxetine Other (See Comments)    headache  . Meloxicam Other (See Comments)    Other reaction(s): Dizziness Nervousness.     VITALS:  Blood pressure (!) 155/77, pulse 72, temperature (!) 97.5 F (36.4 C), temperature source Oral, resp. rate 18, height 5\' 6"  (1.676 m), weight 78.5 kg (173 lb), SpO2 97 %.  PHYSICAL EXAMINATION:  GENERAL:  83 y.o.-year-old patient lying in the bed with no acute distress.  EYES: Pupils equal, round, reactive to light and accommodation. No scleral icterus. Extraocular muscles intact.  HEENT: Head atraumatic, normocephalic. Oropharynx and nasopharynx clear.  NECK:  Supple, no jugular venous distention. No thyroid  enlargement, no tenderness.  LUNGS: Normal breath sounds bilaterally, no wheezing, rales,rhonchi or crepitation. No use of accessory muscles of respiration.  CARDIOVASCULAR: S1, S2 normal. No murmurs, rubs, or gallops.  ABDOMEN: Soft, nontender, nondistended. Bowel sounds present. No organomegaly or mass.  EXTREMITIES: No pedal edema, cyanosis, or clubbing.  NEUROLOGIC: Cranial nerves II through XII are intact. Muscle strength 5/5 in all extremities. Sensation intact. Gait not checked.  Has expressive aphasia.,  Able to follow commands with nonverbal problems. PSYCHIATRIC: The patient is alert and oriented x 3.  SKIN: No obvious rash, lesion, or ulcer.    LABORATORY PANEL:   CBC Recent Labs  Lab 05/18/17 0407  WBC 4.8  HGB 10.2*  HCT 30.6*  PLT 198   ------------------------------------------------------------------------------------------------------------------  Chemistries  Recent Labs  Lab 05/17/17 2241 05/18/17 0407  NA 138  --   K 3.7  --   CL 104  --   CO2 26  --   GLUCOSE 180*  --   BUN 24*  --   CREATININE 1.19 1.00  CALCIUM 9.2  --    ------------------------------------------------------------------------------------------------------------------  Cardiac Enzymes Recent Labs  Lab 05/18/17 0156  TROPONINI 0.03*   ------------------------------------------------------------------------------------------------------------------  RADIOLOGY:  Ct Head Wo Contrast  Result Date: 05/18/2017 CLINICAL DATA:  Acute onset of generalized weakness and slurred speech. EXAM: CT HEAD WITHOUT CONTRAST TECHNIQUE: Contiguous axial images were obtained from the base of the skull through the vertex without intravenous contrast. COMPARISON:  None. FINDINGS: Brain: No evidence of acute infarction, hemorrhage, hydrocephalus, extra-axial collection or mass lesion/mass effect. Prominence of the ventricles  and sulci suggests mild cortical volume loss. Scattered periventricular and  subcortical white matter change likely reflects small vessel ischemic microangiopathy. The brainstem and fourth ventricle are within normal limits. The basal ganglia are unremarkable in appearance. The cerebral hemispheres demonstrate grossly normal gray-white differentiation. No mass effect or midline shift is seen. Vascular: No hyperdense vessel or unexpected calcification. Skull: There is no evidence of fracture; visualized osseous structures are unremarkable in appearance. Sinuses/Orbits: The visualized portions of the orbits are within normal limits. The paranasal sinuses and mastoid air cells are well-aerated. Other: No significant soft tissue abnormalities are seen. IMPRESSION: 1. No acute intracranial pathology seen on CT. 2. Mild cortical volume loss and scattered small vessel ischemic microangiopathy. Electronically Signed   By: Garald Balding M.D.   On: 05/18/2017 00:10   Mr Brain Wo Contrast  Result Date: 05/18/2017 CLINICAL DATA:  83 year old male with abnormal speech including stuttering for several weeks. Progressive symptoms. EXAM: MRI HEAD WITHOUT CONTRAST MRA HEAD WITHOUT CONTRAST TECHNIQUE: Multiplanar, multiecho pulse sequences of the brain and surrounding structures were obtained without intravenous contrast. Angiographic images of the head were obtained using MRA technique without contrast. COMPARISON:  Head CT without contrast 05/17/2017. FINDINGS: MRI HEAD FINDINGS Brain: Confluent wedge-shaped 2 cm area of restricted diffusion in the left pons (series 9, image 19) is associated with T2 and FLAIR hyperintensity, and mild T1 hypointensity. No associated hemorrhage or mass effect. No other restricted diffusion or acute infarct. Chronic lacunar infarct in the central left thalamus. Patchy and confluent bilateral cerebral white matter T2 and FLAIR hyperintensity. No cortical encephalomalacia or chronic cerebral blood products identified. No midline shift, mass effect, evidence of mass  lesion, ventriculomegaly, extra-axial collection or acute intracranial hemorrhage. Cervicomedullary junction and pituitary are within normal limits. Vascular: Major intracranial vascular flow voids are preserved, the distal left vertebral artery appears dominant. Skull and upper cervical spine: Degenerative ligamentous hypertrophy about the odontoid. Partially visible advanced chronic disc and endplate degeneration at C3-C4. Upper cervical degenerative spinal stenosis suspected. Normal bone marrow signal. Sinuses/Orbits: Postoperative changes to both globes, otherwise normal orbits soft tissues. Trace paranasal sinus mucosal thickening. Other: Mastoid air cells are clear. Negative orbit and scalp soft tissues. MRA HEAD FINDINGS Antegrade flow in the posterior circulation with dominant distal left vertebral artery. There is flow signal in the distal right vertebral artery which appears to functionally terminates in PICA. Patent left PICA origin. Patent basilar artery with moderate proximal and severe distal basilar stenosis. See series 23, image 9. The high-grade distal basilar stenosis proximal to the SCA and PCA origins is string sign type. The distal basilar remains patent. Fetal type PCA origins, more so the right. SCAs and PCAs are patent. There is mild to moderate irregularity and stenosis of the left PCA P2 segment. Distal PCA branches are within normal limits. Antegrade flow in both ICA siphons. Siphon irregularity without significant stenosis. Ophthalmic and posterior communicating artery origins are normal. Patent carotid termini. Normal MCA and ACA origins. Tortuous bilateral ACA is. Anterior communicating artery within normal limits. Possible median artery of the corpus callosum. The left ACA is mildly dominant. Bilateral MCA M1 segments are mildly tortuous. Bilateral M1 segments and MCA bifurcations are patent. Visualized bilateral MCA branches are within normal limits. IMPRESSION: 1. Severe Basilar  Artery atherosclerosis and stenosis with RADIOGRAPHIC-STRING-SIGN stenosis of the basilar artery distal third. No intracranial artery occlusion identified. 2. Associated acute to subacute appearing Left Pontine Infarct (Basilar Artery perforator branch territory) with no associated hemorrhage or mass  effect. 3. Chronic left thalamic lacunar infarct. Moderately advanced for age bilateral cerebral white matter signal changes most commonly due to chronic small vessel disease. 4. Mild to moderate left PCA P2 segment stenosis. 5. Dominant left vertebral artery, the right functionally terminates in PICA. Electronically Signed   By: Genevie Ann M.D.   On: 05/18/2017 11:02   US Carotid Bilateral (at [facility] Only)  Result Date: 05/18/2017 CLINICAL DATA:  Acute left pontine infarct EXAM: BILATERAL CAROTID DUPLEX ULTRASOUND TECHNIQUE: Pearline Cables scale imaging, color Doppler and duplex ultrasound were performed of bilateral carotid and vertebral arteries in the neck. COMPARISON:  05/18/2017 FINDINGS: Criteria: Quantification of carotid stenosis is based on velocity parameters that correlate the residual internal carotid diameter with NASCET-based stenosis levels, using the diameter of the distal internal carotid lumen as the denominator for stenosis measurement. The following velocity measurements were obtained: RIGHT ICA:  89/14 cm/sec CCA:  15/40 cm/sec SYSTOLIC ICA/CCA RATIO:  1.1 DIASTOLIC ICA/CCA RATIO:  1.2 ECA:  90 cm/sec LEFT ICA:  97/23 cm/sec CCA:  08/67 cm/sec SYSTOLIC ICA/CCA RATIO:  1.0 DIASTOLIC ICA/CCA RATIO:  2.5 ECA:  66 cm/sec RIGHT CAROTID ARTERY: Mild intimal thickening and hypoechoic carotid bifurcation atherosclerosis. Slight tortuosity noted. Despite this, there is no hemodynamically significant right ICA stenosis, velocity elevation, or turbulent flow. RIGHT VERTEBRAL ARTERY:  Antegrade LEFT CAROTID ARTERY: Similar mild intimal thickening and atherosclerotic plaque formation. No hemodynamically  significant left ICA stenosis, velocity elevation, or turbulent flow. LEFT VERTEBRAL ARTERY:  Antegrade IMPRESSION: Mild bilateral carotid atherosclerosis. No hemodynamically significant ICA stenosis. Degree of narrowing less than 50% bilaterally by ultrasound criteria. Patent antegrade vertebral flow bilaterally Electronically Signed   By: Jerilynn Mages.  Shick M.D.   On: 05/18/2017 11:37   Mr Jodene Nam Head/brain YP Cm  Result Date: 05/18/2017 CLINICAL DATA:  83 year old male with abnormal speech including stuttering for several weeks. Progressive symptoms. EXAM: MRI HEAD WITHOUT CONTRAST MRA HEAD WITHOUT CONTRAST TECHNIQUE: Multiplanar, multiecho pulse sequences of the brain and surrounding structures were obtained without intravenous contrast. Angiographic images of the head were obtained using MRA technique without contrast. COMPARISON:  Head CT without contrast 05/17/2017. FINDINGS: MRI HEAD FINDINGS Brain: Confluent wedge-shaped 2 cm area of restricted diffusion in the left pons (series 9, image 19) is associated with T2 and FLAIR hyperintensity, and mild T1 hypointensity. No associated hemorrhage or mass effect. No other restricted diffusion or acute infarct. Chronic lacunar infarct in the central left thalamus. Patchy and confluent bilateral cerebral white matter T2 and FLAIR hyperintensity. No cortical encephalomalacia or chronic cerebral blood products identified. No midline shift, mass effect, evidence of mass lesion, ventriculomegaly, extra-axial collection or acute intracranial hemorrhage. Cervicomedullary junction and pituitary are within normal limits. Vascular: Major intracranial vascular flow voids are preserved, the distal left vertebral artery appears dominant. Skull and upper cervical spine: Degenerative ligamentous hypertrophy about the odontoid. Partially visible advanced chronic disc and endplate degeneration at C3-C4. Upper cervical degenerative spinal stenosis suspected. Normal bone marrow signal.  Sinuses/Orbits: Postoperative changes to both globes, otherwise normal orbits soft tissues. Trace paranasal sinus mucosal thickening. Other: Mastoid air cells are clear. Negative orbit and scalp soft tissues. MRA HEAD FINDINGS Antegrade flow in the posterior circulation with dominant distal left vertebral artery. There is flow signal in the distal right vertebral artery which appears to functionally terminates in PICA. Patent left PICA origin. Patent basilar artery with moderate proximal and severe distal basilar stenosis. See series 23, image 9. The high-grade distal basilar stenosis proximal to the  SCA and PCA origins is string sign type. The distal basilar remains patent. Fetal type PCA origins, more so the right. SCAs and PCAs are patent. There is mild to moderate irregularity and stenosis of the left PCA P2 segment. Distal PCA branches are within normal limits. Antegrade flow in both ICA siphons. Siphon irregularity without significant stenosis. Ophthalmic and posterior communicating artery origins are normal. Patent carotid termini. Normal MCA and ACA origins. Tortuous bilateral ACA is. Anterior communicating artery within normal limits. Possible median artery of the corpus callosum. The left ACA is mildly dominant. Bilateral MCA M1 segments are mildly tortuous. Bilateral M1 segments and MCA bifurcations are patent. Visualized bilateral MCA branches are within normal limits. IMPRESSION: 1. Severe Basilar Artery atherosclerosis and stenosis with RADIOGRAPHIC-STRING-SIGN stenosis of the basilar artery distal third. No intracranial artery occlusion identified. 2. 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 3. Chronic left thalamic lacunar infarct. Moderately advanced for age bilateral cerebral white matter signal changes most commonly due to chronic small vessel disease. 4. Mild to moderate left PCA P2  segment stenosis. 5. Dominant left vertebral artery, the right functionally terminates in PICA. Electronically Signed   By: Genevie Ann M.D.   On: 05/18/2017 11:02    EKG:   Orders placed or performed during the hospital encounter of 05/17/17  . ED EKG  . ED EKG  . EKG 12-Lead  . EKG 12-Lead  . EKG 12-Lead  . EKG 12-Lead    ASSESSMENT AND PLAN:  1 .acute left pontine stroke: Patient also had chronic left thalamic stroke.  Echo showed EF more than 55%, ultrasound of carotids no hemodynamically significant stenosis, LDL is 67.  Hemoglobin A1c pending.  Patient is on aspirin, Plavix, physical therapy recommended skilled nursing.   #2 history of CAD.  Continue aspirin, Plavix. History of anxiety, a lot of stress at home: Continue BuSpar, increased Klonopin.  #3. essential hypertension:controlled   All the records are reviewed and case discussed with Care Management/Social Workerr. Management plans discussed with the patient, family and they are in agreement.  CODE STATUS:full TOTAL TIME TAKING CARE OF THIS PATIENT: 68minutes.   POSSIBLE D/C IN 1-2DAYS, DEPENDING ON CLINICAL CONDITION.   Epifanio Lesches M.D on 05/19/2017 at 12:43 PM  Between 7am to 6pm - Pager - [phone]  After 6pm go to [email or web address] - password [email or web address] [facility]  Office  [phone]  CC: Primary care physician; Guadalupe Maple, MD   Note: This dictation was prepared with Dragon dictation along with smaller phrase technology. Any transcriptional errors that result from this process are unintentional.

## 2017-05-19 NOTE — NC FL2 (Signed)
[place] LEVEL OF CARE SCREENING TOOL     IDENTIFICATION  Patient Name: Todd [name] Birthdate: [birth year] Sex: male Admission Date (Current Location): 05/17/2017  [place] and [place] Number:  Engineering geologist and Address:  [facility], [address], [place], [place] [address]      Provider Number: [id number]  Attending Physician Name and Address:  Epifanio Lesches, MD  Relative Name and Phone Number:  Abednego Yeates (daughter) [phone] [name] (daughter) [phone]    Current Level of Care: Hospital Recommended Level of Care:   Prior Approval Number:    Date Approved/Denied: 05/19/17 PASRR Number: [id number]  Discharge Plan: SNF    Current Diagnoses: Patient Active Problem List   Diagnosis Date Noted  . Aphasia 05/18/2017  . CAD (coronary artery disease) 05/18/2017  . Acute CVA (cerebrovascular accident) ([place]) 05/18/2017  . Chest pain 03/17/2017  . 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 Urinary incontinence 04/27/2015  . Anxiety, generalized 04/07/2015  . History of BCG vaccination 04/07/2015  . GERD (gastroesophageal reflux disease) 03/04/2015  . Anxiety 03/04/2015  . Fatigue 03/01/2015  . Depression 02/25/2015  . Degeneration of intervertebral disc of lumbar region 02/23/2015  . Neuritis or radiculitis due to rupture of lumbar intervertebral disc 02/23/2015  . Lumbar canal stenosis 02/23/2015  . Hypertension 12/02/2014  . Diabetes mellitus without complication ([place]) 88/50/2774  . Benign essential tremor 03/13/2014  . Imbalance 03/13/2014  . CA of prostate ([place]) 10/07/2012  . Malignant neoplasm of prostate ([place]) 10/07/2012  . ED (erectile dysfunction) of organic origin 02/09/2012    Orientation RESPIRATION BLADDER Height & Weight     Self, Time, Situation, Place  Normal Continent Weight: 173 lb (78.5 kg) Height:  5\' 6"  (167.6 cm)  BEHAVIORAL SYMPTOMS/MOOD NEUROLOGICAL BOWEL NUTRITION  STATUS      Continent Diet(Heart healthy, Carb Modified)  AMBULATORY STATUS COMMUNICATION OF NEEDS Skin   Extensive Assist Non-Verbally(The patient has significant speech difficulties) Normal                       Personal Care Assistance Level of Assistance  Bathing, Feeding, Dressing Bathing Assistance: Limited assistance Feeding assistance: Independent Dressing Assistance: Limited assistance     Functional Limitations Info  Speech     Speech Info: Impaired    SPECIAL CARE FACTORS FREQUENCY  PT (By licensed PT), OT (By licensed OT), Speech therapy     PT Frequency: Up to 5X per day, 5 days per week OT Frequency: Up to 5X per day, 5 days per week     Speech Therapy Frequency: Up to 5X per day, 5 days per week      Contractures Contractures Info: Not present    Additional Factors Info  Code Status, Allergies, Psychotropic, Insulin Sliding Scale Code Status Info: Full Allergies Info: Fluoxetine, Meloxicam Psychotropic Info: Buspar, Klonopin, Cymbalta, Norvasc Insulin Sliding Scale Info: Novolog: 0-5 units QHS and 0-9 units tid with meals       Current Medications (05/19/2017):  This is the current hospital active medication list Current Facility-Administered Medications  Medication Dose Route Frequency Provider Last Rate Last Dose  . acetaminophen (TYLENOL) tablet 650 mg  650 mg Oral Q4H PRN Lance Coon, MD   650 mg at 05/18/17 1287   Or  . acetaminophen (TYLENOL) solution 650 mg  650 mg Per Tube Q4H PRN Lance Coon, MD       Or  . acetaminophen (TYLENOL) suppository 650 mg  650  mg Rectal Q4H PRN Lance Coon, MD      . aspirin EC tablet 81 mg  81 mg Oral Daily Lance Coon, MD   81 mg at 05/18/17 3646  . atorvastatin (LIPITOR) tablet 10 mg  10 mg Oral Daily Lance Coon, MD   10 mg at 05/18/17 0827  . busPIRone (BUSPAR) tablet 10 mg  10 mg Oral Daily Lance Coon, MD   10 mg at 05/18/17 2157  . clonazePAM (KLONOPIN) tablet 0.5 mg  0.5 mg Oral Daily PRN  Lance Coon, MD      . diphenhydrAMINE (BENADRYL) capsule 25 mg  25 mg Oral QHS PRN Lance Coon, MD      . DULoxetine (CYMBALTA) DR capsule 20 mg  20 mg Oral Daily Lance Coon, MD   20 mg at 05/18/17 2156  . enoxaparin (LOVENOX) injection 40 mg  40 mg Subcutaneous Q24H Lance Coon, MD   40 mg at 05/18/17 2157  . gabapentin (NEURONTIN) capsule 100 mg  100 mg Oral BID Lance Coon, MD   100 mg at 05/18/17 8032  . insulin aspart (novoLOG) injection 0-5 Units  0-5 Units Subcutaneous QHS Lance Coon, MD      . insulin aspart (novoLOG) injection 0-9 Units  0-9 Units Subcutaneous TID WC Lance Coon, MD   2 Units at 05/18/17 1833  . pantoprazole (PROTONIX) EC tablet 40 mg  40 mg Oral Daily Lance Coon, MD   40 mg at 05/18/17 1224     Discharge Medications: Please see discharge summary for a list of discharge medications.  Relevant Imaging Results:  Relevant Lab Results:   Additional Information [phone]  Zettie Pho, LCSW

## 2017-05-19 NOTE — Progress Notes (Signed)
Physical Therapy Treatment Patient Details Name: Todd [name] MRN: [id number] DOB: 1931-05-07 Today's Date: 05/19/2017    History of Present Illness presented to ER with acute onset of difficulty speaking; admitted for acute CVA. MRI significant for L pontine infarct; severe basilar artery stenosis    PT Comments    Daughter in attendance for session.  To edge of bed with min a x 1.  Pt was able to stand and ambulate 85' x 1 with min a x 2 for safety.  Pt with overall poor gait quality and safety with poor walker position and hand placments, decreased step height and length and overall increased fall risk.  He does require +1-2 for general safety.  He was able to increase his ambulation distance today with no knee buckling noted. Pt participated in seated LE ex exercises for ankle pumps, LAQ and marches.  He needed verbal and tactile cues to complete full range and repetitions.    SNF remains an appropriate discharge plan at this time.  Discussed with daughter.     Follow Up Recommendations  SNF     Equipment Recommendations  Rolling walker with 5" wheels;3in1 (PT)    Recommendations for Other Services       Precautions / Restrictions Precautions Precautions: Fall Restrictions Weight Bearing Restrictions: No    Mobility  Bed Mobility Overal bed mobility: Needs Assistance Bed Mobility: Supine to Sit     Supine to sit: Min assist        Transfers Overall transfer level: Needs assistance Equipment used: Rolling walker (2 wheeled) Transfers: Sit to/from Stand Sit to Stand: Mod assist            Ambulation/Gait Ambulation/Gait assistance: Min assist;+2 physical assistance Ambulation Distance (Feet): 85 Feet Assistive device: Rolling walker (2 wheeled) Gait Pattern/deviations: Step-to pattern;Decreased step length - right;Decreased step length - left;Trunk flexed   Gait velocity interpretation: Below normal speed for age/gender General Gait Details: Pt with  generally unsteady gait with poor walker position.  Verbal cues to keep walker in proper position and to increase step height.     Stairs            Wheelchair Mobility    Modified Rankin (Stroke Patients Only)       Balance Overall balance assessment: Needs assistance Sitting-balance support: No upper extremity supported;Feet supported Sitting balance-Leahy Scale: Good     Standing balance support: Bilateral upper extremity supported Standing balance-Leahy Scale: Poor                              Cognition Arousal/Alertness: Awake/alert Behavior During Therapy: WFL for tasks assessed/performed Overall Cognitive Status: Difficult to assess                                        Exercises      General Comments        Pertinent Vitals/Pain Pain Assessment: No/denies pain    Home Living                      Prior Function            PT Goals (current goals can now be found in the care plan section) Progress towards PT goals: Progressing toward goals    Frequency    7X/week      PT Plan Current  plan remains appropriate    Co-evaluation              AM-PAC PT "6 Clicks" Daily Activity  Outcome Measure  Difficulty turning over in bed (including adjusting bedclothes, sheets and blankets)?: Unable Difficulty moving from lying on back to sitting on the side of the bed? : Unable Difficulty sitting down on and standing up from a chair with arms (e.g., wheelchair, bedside commode, etc,.)?: Unable Help needed moving to and from a bed to chair (including a wheelchair)?: A Lot Help needed walking in hospital room?: A Lot Help needed climbing 3-5 steps with a railing? : A Lot 6 Click Score: 9    End of Session Equipment Utilized During Treatment: Gait belt Activity Tolerance: Patient tolerated treatment well Patient left: in chair;with call bell/phone within reach;with chair alarm set;with family/visitor  present Nurse Communication: Mobility status Hemiplegia - Right/Left: Right Hemiplegia - dominant/non-dominant: Dominant Hemiplegia - caused by: Cerebral infarction     Time: 1610-9604 PT Time Calculation (min) (ACUTE ONLY): 12 min  Charges:  $Gait Training: 8-22 mins                    G Codes:     Chesley Noon, PTA 05-30-2017, 12:10 PM

## 2017-05-19 NOTE — Progress Notes (Signed)
Subjective: Patient awake and alert this morning, eating.  Now resting and somewhat difficult to arouse.    Objective: Current vital signs: BP (!) 156/67 (BP Location: Left Arm)   Pulse (!) 49   Temp 97.7 F (36.5 C) (Oral)   Resp 18   Ht 5\' 6"  (1.676 m)   Wt 78.5 kg (173 lb)   SpO2 99%   BMI 27.92 kg/m  Vital signs in last 24 hours: Temp:  [97.7 F (36.5 C)-98.6 F (37 C)] 97.7 F (36.5 C) (12/15 0802) Pulse Rate:  [49-68] 49 (12/15 0802) Resp:  [18] 18 (12/15 0802) BP: (125-156)/(62-75) 156/67 (12/15 0802) SpO2:  [99 %-100 %] 99 % (12/15 0802)  Intake/Output from previous day: 12/14 0701 - 12/15 0700 In: 720 [P.O.:720] Out: 700 [Urine:700] Intake/Output this shift: No intake/output data recorded. Nutritional status: Diet heart healthy/carb modified Room service appropriate? Yes; Fluid consistency: Thin  Neurologic Exam: Mental Status: Lethargic.  Requires extensive stimulation to awaken.  Remains aphasic.   Cranial Nerves: II: Discs flat bilaterally; Blinks to bilateral confrontation, pupils equal, round, reactive to light and accommodation III,IV, VI: ptosis not present, extra-ocular motions intact bilaterally V,VII: right facial droop, facial light touch sensation normal bilaterally VIII: hearing normal bilaterally IX,X: gag reflex present XI: bilateral shoulder shrug XII: midline tongue extension Motor: 4+-5-/5 strength on the right.  Patient 5/5 on the left   Lab Results: Basic Metabolic Panel: Recent Labs  Lab 05/17/17 2241 05/18/17 0407  NA 138  --   K 3.7  --   CL 104  --   CO2 26  --   GLUCOSE 180*  --   BUN 24*  --   CREATININE 1.19 1.00  CALCIUM 9.2  --     Liver Function Tests: No results for input(s): AST, ALT, ALKPHOS, BILITOT, PROT, ALBUMIN in the last 168 hours. No results for input(s): LIPASE, AMYLASE in the last 168 hours. No results for input(s): AMMONIA in the last 168 hours.  CBC: Recent Labs  Lab 05/17/17 2241  05/18/17 0407  WBC 4.9 4.8  HGB 11.0* 10.2*  HCT 33.7* 30.6*  MCV 94.3 94.2  PLT 231 198    Cardiac Enzymes: Recent Labs  Lab 05/17/17 2241 05/18/17 0156  TROPONINI 0.03* 0.03*    Lipid Panel: Recent Labs  Lab 05/18/17 0407  CHOL 122  TRIG 97  HDL 36*  CHOLHDL 3.4  VLDL 19  LDLCALC 67    CBG: Recent Labs  Lab 05/18/17 0743 05/18/17 1223 05/18/17 1652 05/18/17 2147 05/19/17 0732  GLUCAP 122* 152* 156* 118* 126*    Microbiology: Results for orders placed or performed in visit on 01/09/17  Microscopic Examination     Status: Abnormal   Collection Time: 01/09/17  1:59 PM  Result Value Ref Range Status   WBC, UA 0-5 0 - 5 /hpf Final   RBC, UA 0-2 0 - 2 /hpf Final   Epithelial Cells (non renal) 0-10 0 - 10 /hpf Final   Renal Epithel, UA 0-10 (A) None seen /hpf Final   Mucus, UA Present (A) Not Estab. Final   Bacteria, UA None seen None seen/Few Final    Coagulation Studies: No results for input(s): LABPROT, INR in the last 72 hours.  Imaging: Ct Head Wo Contrast  Result Date: 05/18/2017 CLINICAL DATA:  Acute onset of generalized weakness and slurred speech. EXAM: CT HEAD WITHOUT CONTRAST TECHNIQUE: Contiguous axial images were obtained from the base of the skull through the vertex without intravenous contrast.  COMPARISON:  None. FINDINGS: Brain: No evidence of acute infarction, hemorrhage, hydrocephalus, extra-axial collection or mass lesion/mass effect. Prominence of the ventricles and sulci suggests mild cortical volume loss. Scattered periventricular and subcortical white matter change likely reflects small vessel ischemic microangiopathy. The brainstem and fourth ventricle are within normal limits. The basal ganglia are unremarkable in appearance. The cerebral hemispheres demonstrate grossly normal gray-white differentiation. No mass effect or midline shift is seen. Vascular: No hyperdense vessel or unexpected calcification. Skull: There is no evidence of  fracture; visualized osseous structures are unremarkable in appearance. Sinuses/Orbits: The visualized portions of the orbits are within normal limits. The paranasal sinuses and mastoid air cells are well-aerated. Other: No significant soft tissue abnormalities are seen. IMPRESSION: 1. No acute intracranial pathology seen on CT. 2. Mild cortical volume loss and scattered small vessel ischemic microangiopathy. Electronically Signed   By: Garald Balding M.D.   On: 05/18/2017 00:10   Mr Brain Wo Contrast  Result Date: 05/18/2017 CLINICAL DATA:  83 year old male with abnormal speech including stuttering for several weeks. Progressive symptoms. EXAM: MRI HEAD WITHOUT CONTRAST MRA HEAD WITHOUT CONTRAST TECHNIQUE: Multiplanar, multiecho pulse sequences of the brain and surrounding structures were obtained without intravenous contrast. Angiographic images of the head were obtained using MRA technique without contrast. COMPARISON:  Head CT without contrast 05/17/2017. FINDINGS: MRI HEAD FINDINGS Brain: Confluent wedge-shaped 2 cm area of restricted diffusion in the left pons (series 9, image 19) is associated with T2 and FLAIR hyperintensity, and mild T1 hypointensity. No associated hemorrhage or mass effect. No other restricted diffusion or acute infarct. Chronic lacunar infarct in the central left thalamus. Patchy and confluent bilateral cerebral white matter T2 and FLAIR hyperintensity. No cortical encephalomalacia or chronic cerebral blood products identified. No midline shift, mass effect, evidence of mass lesion, ventriculomegaly, extra-axial collection or acute intracranial hemorrhage. Cervicomedullary junction and pituitary are within normal limits. Vascular: Major intracranial vascular flow voids are preserved, the distal left vertebral artery appears dominant. Skull and upper cervical spine: Degenerative ligamentous hypertrophy about the odontoid. Partially visible advanced chronic disc and endplate  degeneration at C3-C4. Upper cervical degenerative spinal stenosis suspected. Normal bone marrow signal. Sinuses/Orbits: Postoperative changes to both globes, otherwise normal orbits soft tissues. Trace paranasal sinus mucosal thickening. Other: Mastoid air cells are clear. Negative orbit and scalp soft tissues. MRA HEAD FINDINGS Antegrade flow in the posterior circulation with dominant distal left vertebral artery. There is flow signal in the distal right vertebral artery which appears to functionally terminates in PICA. Patent left PICA origin. Patent basilar artery with moderate proximal and severe distal basilar stenosis. See series 23, image 9. The high-grade distal basilar stenosis proximal to the SCA and PCA origins is string sign type. The distal basilar remains patent. Fetal type PCA origins, more so the right. SCAs and PCAs are patent. There is mild to moderate irregularity and stenosis of the left PCA P2 segment. Distal PCA branches are within normal limits. Antegrade flow in both ICA siphons. Siphon irregularity without significant stenosis. Ophthalmic and posterior communicating artery origins are normal. Patent carotid termini. Normal MCA and ACA origins. Tortuous bilateral ACA is. Anterior communicating artery within normal limits. Possible median artery of the corpus callosum. The left ACA is mildly dominant. Bilateral MCA M1 segments are mildly tortuous. Bilateral M1 segments and MCA bifurcations are patent. Visualized bilateral MCA branches are within normal limits. IMPRESSION: 1. Severe Basilar Artery atherosclerosis and stenosis with RADIOGRAPHIC-STRING-SIGN stenosis of the basilar artery distal third. No intracranial artery occlusion  identified. 2. 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 3. Chronic left thalamic lacunar infarct. Moderately advanced for age bilateral cerebral white  matter signal changes most commonly due to chronic small vessel disease. 4. Mild to moderate left PCA P2 segment stenosis. 5. Dominant left vertebral artery, the right functionally terminates in PICA. Electronically Signed   By: Genevie Ann M.D.   On: 05/18/2017 11:02   US Carotid Bilateral (at [facility] And [facility] Only)  Result Date: 05/18/2017 CLINICAL DATA:  Acute left pontine infarct EXAM: BILATERAL CAROTID DUPLEX ULTRASOUND TECHNIQUE: Pearline Cables scale imaging, color Doppler and duplex ultrasound were performed of bilateral carotid and vertebral arteries in the neck. COMPARISON:  05/18/2017 FINDINGS: Criteria: Quantification of carotid stenosis is based on velocity parameters that correlate the residual internal carotid diameter with NASCET-based stenosis levels, using the diameter of the distal internal carotid lumen as the denominator for stenosis measurement. The following velocity measurements were obtained: RIGHT ICA:  89/14 cm/sec CCA:  21/30 cm/sec SYSTOLIC ICA/CCA RATIO:  1.1 DIASTOLIC ICA/CCA RATIO:  1.2 ECA:  90 cm/sec LEFT ICA:  97/23 cm/sec CCA:  86/57 cm/sec SYSTOLIC ICA/CCA RATIO:  1.0 DIASTOLIC ICA/CCA RATIO:  2.5 ECA:  66 cm/sec RIGHT CAROTID ARTERY: Mild intimal thickening and hypoechoic carotid bifurcation atherosclerosis. Slight tortuosity noted. Despite this, there is no hemodynamically significant right ICA stenosis, velocity elevation, or turbulent flow. RIGHT VERTEBRAL ARTERY:  Antegrade LEFT CAROTID ARTERY: Similar mild intimal thickening and atherosclerotic plaque formation. No hemodynamically significant left ICA stenosis, velocity elevation, or turbulent flow. LEFT VERTEBRAL ARTERY:  Antegrade IMPRESSION: Mild bilateral carotid atherosclerosis. No hemodynamically significant ICA stenosis. Degree of narrowing less than 50% bilaterally by ultrasound criteria. Patent antegrade vertebral flow bilaterally Electronically Signed   By: Jerilynn Mages.  Shick M.D.   On: 05/18/2017 11:37   Mr Jodene Nam Head/brain QI  Cm  Result Date: 05/18/2017 CLINICAL DATA:  83 year old male with abnormal speech including stuttering for several weeks. Progressive symptoms. EXAM: MRI HEAD WITHOUT CONTRAST MRA HEAD WITHOUT CONTRAST TECHNIQUE: Multiplanar, multiecho pulse sequences of the brain and surrounding structures were obtained without intravenous contrast. Angiographic images of the head were obtained using MRA technique without contrast. COMPARISON:  Head CT without contrast 05/17/2017. FINDINGS: MRI HEAD FINDINGS Brain: Confluent wedge-shaped 2 cm area of restricted diffusion in the left pons (series 9, image 19) is associated with T2 and FLAIR hyperintensity, and mild T1 hypointensity. No associated hemorrhage or mass effect. No other restricted diffusion or acute infarct. Chronic lacunar infarct in the central left thalamus. Patchy and confluent bilateral cerebral white matter T2 and FLAIR hyperintensity. No cortical encephalomalacia or chronic cerebral blood products identified. No midline shift, mass effect, evidence of mass lesion, ventriculomegaly, extra-axial collection or acute intracranial hemorrhage. Cervicomedullary junction and pituitary are within normal limits. Vascular: Major intracranial vascular flow voids are preserved, the distal left vertebral artery appears dominant. Skull and upper cervical spine: Degenerative ligamentous hypertrophy about the odontoid. Partially visible advanced chronic disc and endplate degeneration at C3-C4. Upper cervical degenerative spinal stenosis suspected. Normal bone marrow signal. Sinuses/Orbits: Postoperative changes to both globes, otherwise normal orbits soft tissues. Trace paranasal sinus mucosal thickening. Other: Mastoid air cells are clear. Negative orbit and scalp soft tissues. MRA HEAD FINDINGS Antegrade flow in the posterior circulation with dominant distal left vertebral artery. There is flow signal in the distal right vertebral artery which appears to functionally  terminates in PICA. Patent left PICA origin. Patent basilar  artery with moderate proximal and severe distal basilar stenosis. See series 23, image 9. The high-grade distal basilar stenosis proximal to the SCA and PCA origins is string sign type. The distal basilar remains patent. Fetal type PCA origins, more so the right. SCAs and PCAs are patent. There is mild to moderate irregularity and stenosis of the left PCA P2 segment. Distal PCA branches are within normal limits. Antegrade flow in both ICA siphons. Siphon irregularity without significant stenosis. Ophthalmic and posterior communicating artery origins are normal. Patent carotid termini. Normal MCA and ACA origins. Tortuous bilateral ACA is. Anterior communicating artery within normal limits. Possible median artery of the corpus callosum. The left ACA is mildly dominant. Bilateral MCA M1 segments are mildly tortuous. Bilateral M1 segments and MCA bifurcations are patent. Visualized bilateral MCA branches are within normal limits. IMPRESSION: 1. Severe Basilar Artery atherosclerosis and stenosis with RADIOGRAPHIC-STRING-SIGN stenosis of the basilar artery distal third. No intracranial artery occlusion identified. 2. 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 3. Chronic left thalamic lacunar infarct. Moderately advanced for age bilateral cerebral white matter signal changes most commonly due to chronic small vessel disease. 4. Mild to moderate left PCA P2 segment stenosis. 5. Dominant left vertebral artery, the right functionally terminates in PICA. Electronically Signed   By: Genevie Ann M.D.   On: 05/18/2017 11:02    Medications:  I have reviewed the patient's current medications. Scheduled: . aspirin EC  81 mg Oral Daily  . atorvastatin  10 mg Oral Daily  . busPIRone  10 mg Oral Daily  . DULoxetine  20 mg Oral Daily  . enoxaparin (LOVENOX)  injection  40 mg Subcutaneous Q24H  . gabapentin  100 mg Oral BID  . insulin aspart  0-5 Units Subcutaneous QHS  . insulin aspart  0-9 Units Subcutaneous TID WC  . pantoprazole  40 mg Oral Daily    Assessment/Plan: Patient stable.  Echocardiogram with EF of 55-60%.  A1c 6.3.    Recommendations: 1.  Plavix 75mg  daily along with ASA   LOS: 1 day   Alexis Goodell, MD Neurology [phone] 05/19/2017  11:37 AM

## 2017-05-20 LAB — GLUCOSE, CAPILLARY
Glucose-Capillary: 123 mg/dL — ABNORMAL HIGH (ref 65–99)
Glucose-Capillary: 153 mg/dL — ABNORMAL HIGH (ref 65–99)
Glucose-Capillary: 161 mg/dL — ABNORMAL HIGH (ref 65–99)
Glucose-Capillary: 172 mg/dL — ABNORMAL HIGH (ref 65–99)

## 2017-05-20 MED ORDER — LATANOPROST 0.005 % OP SOLN
1.0000 [drp] | Freq: Every day | OPHTHALMIC | Status: DC
  Administered 2017-05-20 (×2): 1 [drp] via OPHTHALMIC
  Filled 2017-05-20: qty 2.5

## 2017-05-20 MED ORDER — DOCUSATE SODIUM 100 MG PO CAPS
100.0000 mg | ORAL_CAPSULE | Freq: Two times a day (BID) | ORAL | Status: DC
  Administered 2017-05-20 – 2017-05-21 (×2): 100 mg via ORAL
  Filled 2017-05-20 (×2): qty 1

## 2017-05-20 NOTE — Plan of Care (Signed)
VS WDL, free of falls during shift.  Denies pain, nausea.  Pt's daughter refused scheduled PO Clonazepam 0.5mg , opted for PRN PO Benadryl 25mg  for sleep, which was effective.  Per pt's daughter request, home latanoprost eye drops not ordered for this admission, Dr. Marcille Blanco paged, latanoprost eye drops ordered QHS.  No other needs overnight.  Bed in low position, call bell within reach.  WCTM.

## 2017-05-20 NOTE — Progress Notes (Signed)
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  According to family patient is having expressive aphasia for long time but it has gotten worse and he is now not even able to communicate.  Patient initial NIH stroke scale 6. Today he is able to communicate a little better and getting the words out.  CHIEF COMPLAINT:   Chief Complaint  Patient presents with  . Weakness    REVIEW OF SYSTEMS:   ROS CONSTITUTIONAL: No fever, fatigue or weakness.  Slight improvement in speech and able to talk better EYES: No blurred or double vision.  EARS, NOSE, AND THROAT: No tinnitus or ear pain.  RESPIRATORY: No cough, shortness of breath, wheezing or hemoptysis.  CARDIOVASCULAR: No chest pain, orthopnea, edema.  GASTROINTESTINAL: No nausea, vomiting, diarrhea or abdominal pain.  GENITOURINARY: No dysuria, hematuria.  ENDOCRINE: No polyuria, nocturia,  HEMATOLOGY: No anemia, easy bruising or bleeding SKIN: No rash or lesion. MUSCULOSKELETAL: No joint pain or arthritis.   NEUROLOGIC: No tingling, numbness, weakness.  PSYCHIATRY: No anxiety or depression.   DRUG ALLERGIES:   Allergies  Allergen Reactions  . Fluoxetine Other (See Comments)    headache  . Meloxicam Other (See Comments)    Other reaction(s): Dizziness Nervousness.     VITALS:  Blood pressure 118/84, pulse 67, temperature 98.3 F (36.8 C), temperature source Oral, resp. rate 18, height 5\' 6"  (1.676 m), weight 78.5 kg (173 lb), SpO2 98 %.  PHYSICAL EXAMINATION:  GENERAL:  83 y.o.-year-old patient lying in the bed with no acute distress.  EYES: Pupils equal, round, reactive to light and accommodation. No scleral icterus. Extraocular muscles intact.  HEENT: Head atraumatic, normocephalic. Oropharynx and  nasopharynx clear.  NECK:  Supple, no jugular venous distention. No thyroid enlargement, no tenderness.  LUNGS: Normal breath sounds bilaterally, no wheezing, rales,rhonchi or crepitation. No use of accessory muscles of respiration.  CARDIOVASCULAR: S1, S2 normal. No murmurs, rubs, or gallops.  ABDOMEN: Soft, nontender, nondistended. Bowel sounds present. No organomegaly or mass.  EXTREMITIES: No pedal edema, cyanosis, or clubbing.  NEUROLOGIC: Cranial nerves II through XII are intact. Muscle strength 5/5 in all extremities. Sensation intact. Gait not checked.  Has expressive aphasia.,  Able to follow commands with nonverbal problems. PSYCHIATRIC: The patient is alert and oriented x 3.  Patient has slight weakness in the grip strength in both arms since admission. SKIN: No obvious rash, lesion, or ulcer.    LABORATORY PANEL:   CBC Recent Labs  Lab 05/18/17 0407  WBC 4.8  HGB 10.2*  HCT 30.6*  PLT 198   ------------------------------------------------------------------------------------------------------------------  Chemistries  Recent Labs  Lab 05/17/17 2241 05/18/17 0407  NA 138  --   K 3.7  --   CL 104  --   CO2 26  --   GLUCOSE 180*  --   BUN 24*  --   CREATININE 1.19 1.00  CALCIUM 9.2  --    ------------------------------------------------------------------------------------------------------------------  Cardiac Enzymes Recent Labs  Lab 05/18/17 0156  TROPONINI 0.03*   ------------------------------------------------------------------------------------------------------------------  RADIOLOGY:  Mr Brain Wo Contrast  Result Date: 05/18/2017 CLINICAL DATA:  83 year old male with abnormal speech including stuttering for several weeks. Progressive symptoms. EXAM: MRI HEAD WITHOUT CONTRAST MRA HEAD WITHOUT CONTRAST TECHNIQUE: Multiplanar, multiecho pulse sequences of the brain and surrounding  structures were obtained without intravenous contrast. Angiographic  images of the head were obtained using MRA technique without contrast. COMPARISON:  Head CT without contrast 05/17/2017. FINDINGS: MRI HEAD FINDINGS Brain: Confluent wedge-shaped 2 cm area of restricted diffusion in the left pons (series 9, image 19) is associated with T2 and FLAIR hyperintensity, and mild T1 hypointensity. No associated hemorrhage or mass effect. No other restricted diffusion or acute infarct. Chronic lacunar infarct in the central left thalamus. Patchy and confluent bilateral cerebral white matter T2 and FLAIR hyperintensity. No cortical encephalomalacia or chronic cerebral blood products identified. No midline shift, mass effect, evidence of mass lesion, ventriculomegaly, extra-axial collection or acute intracranial hemorrhage. Cervicomedullary junction and pituitary are within normal limits. Vascular: Major intracranial vascular flow voids are preserved, the distal left vertebral artery appears dominant. Skull and upper cervical spine: Degenerative ligamentous hypertrophy about the odontoid. Partially visible advanced chronic disc and endplate degeneration at C3-C4. Upper cervical degenerative spinal stenosis suspected. Normal bone marrow signal. Sinuses/Orbits: Postoperative changes to both globes, otherwise normal orbits soft tissues. Trace paranasal sinus mucosal thickening. Other: Mastoid air cells are clear. Negative orbit and scalp soft tissues. MRA HEAD FINDINGS Antegrade flow in the posterior circulation with dominant distal left vertebral artery. There is flow signal in the distal right vertebral artery which appears to functionally terminates in PICA. Patent left PICA origin. Patent basilar artery with moderate proximal and severe distal basilar stenosis. See series 23, image 9. The high-grade distal basilar stenosis proximal to the SCA and PCA origins is string sign type. The distal basilar remains patent. Fetal type PCA origins, more so the right. SCAs and PCAs are patent. There is  mild to moderate irregularity and stenosis of the left PCA P2 segment. Distal PCA branches are within normal limits. Antegrade flow in both ICA siphons. Siphon irregularity without significant stenosis. Ophthalmic and posterior communicating artery origins are normal. Patent carotid termini. Normal MCA and ACA origins. Tortuous bilateral ACA is. Anterior communicating artery within normal limits. Possible median artery of the corpus callosum. The left ACA is mildly dominant. Bilateral MCA M1 segments are mildly tortuous. Bilateral M1 segments and MCA bifurcations are patent. Visualized bilateral MCA branches are within normal limits. IMPRESSION: 1. Severe Basilar Artery atherosclerosis and stenosis with RADIOGRAPHIC-STRING-SIGN stenosis of the basilar artery distal third. No intracranial artery occlusion identified. 2. 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 3. Chronic left thalamic lacunar infarct. Moderately advanced for age bilateral cerebral white matter signal changes most commonly due to chronic small vessel disease. 4. Mild to moderate left PCA P2 segment stenosis. 5. Dominant left vertebral artery, the right functionally terminates in PICA. Electronically Signed   By: Genevie Ann M.D.   On: 05/18/2017 11:02   US Carotid Bilateral (at [facility] Only)  Result Date: 05/18/2017 CLINICAL DATA:  Acute left pontine infarct EXAM: BILATERAL CAROTID DUPLEX ULTRASOUND TECHNIQUE: Pearline Cables scale imaging, color Doppler and duplex ultrasound were performed of bilateral carotid and vertebral arteries in the neck. COMPARISON:  05/18/2017 FINDINGS: Criteria: Quantification of carotid stenosis is based on velocity parameters that correlate the residual internal carotid diameter with NASCET-based stenosis levels, using the diameter of the distal internal carotid lumen as the denominator for stenosis measurement. The  following velocity measurements were obtained: RIGHT ICA:  89/14 cm/sec CCA:  64/40 cm/sec SYSTOLIC ICA/CCA RATIO:  1.1 DIASTOLIC ICA/CCA RATIO:  1.2 ECA:  90 cm/sec LEFT ICA:  97/23 cm/sec  CCA:  10/25 cm/sec SYSTOLIC ICA/CCA RATIO:  1.0 DIASTOLIC ICA/CCA RATIO:  2.5 ECA:  66 cm/sec RIGHT CAROTID ARTERY: Mild intimal thickening and hypoechoic carotid bifurcation atherosclerosis. Slight tortuosity noted. Despite this, there is no hemodynamically significant right ICA stenosis, velocity elevation, or turbulent flow. RIGHT VERTEBRAL ARTERY:  Antegrade LEFT CAROTID ARTERY: Similar mild intimal thickening and atherosclerotic plaque formation. No hemodynamically significant left ICA stenosis, velocity elevation, or turbulent flow. LEFT VERTEBRAL ARTERY:  Antegrade IMPRESSION: Mild bilateral carotid atherosclerosis. No hemodynamically significant ICA stenosis. Degree of narrowing less than 50% bilaterally by ultrasound criteria. Patent antegrade vertebral flow bilaterally Electronically Signed   By: Jerilynn Mages.  Shick M.D.   On: 05/18/2017 11:37   Mr Jodene Nam Head/brain EN Cm  Result Date: 05/18/2017 CLINICAL DATA:  83 year old male with abnormal speech including stuttering for several weeks. Progressive symptoms. EXAM: MRI HEAD WITHOUT CONTRAST MRA HEAD WITHOUT CONTRAST TECHNIQUE: Multiplanar, multiecho pulse sequences of the brain and surrounding structures were obtained without intravenous contrast. Angiographic images of the head were obtained using MRA technique without contrast. COMPARISON:  Head CT without contrast 05/17/2017. FINDINGS: MRI HEAD FINDINGS Brain: Confluent wedge-shaped 2 cm area of restricted diffusion in the left pons (series 9, image 19) is associated with T2 and FLAIR hyperintensity, and mild T1 hypointensity. No associated hemorrhage or mass effect. No other restricted diffusion or acute infarct. Chronic lacunar infarct in the central left thalamus. Patchy and confluent bilateral cerebral white matter T2  and FLAIR hyperintensity. No cortical encephalomalacia or chronic cerebral blood products identified. No midline shift, mass effect, evidence of mass lesion, ventriculomegaly, extra-axial collection or acute intracranial hemorrhage. Cervicomedullary junction and pituitary are within normal limits. Vascular: Major intracranial vascular flow voids are preserved, the distal left vertebral artery appears dominant. Skull and upper cervical spine: Degenerative ligamentous hypertrophy about the odontoid. Partially visible advanced chronic disc and endplate degeneration at C3-C4. Upper cervical degenerative spinal stenosis suspected. Normal bone marrow signal. Sinuses/Orbits: Postoperative changes to both globes, otherwise normal orbits soft tissues. Trace paranasal sinus mucosal thickening. Other: Mastoid air cells are clear. Negative orbit and scalp soft tissues. MRA HEAD FINDINGS Antegrade flow in the posterior circulation with dominant distal left vertebral artery. There is flow signal in the distal right vertebral artery which appears to functionally terminates in PICA. Patent left PICA origin. Patent basilar artery with moderate proximal and severe distal basilar stenosis. See series 23, image 9. The high-grade distal basilar stenosis proximal to the SCA and PCA origins is string sign type. The distal basilar remains patent. Fetal type PCA origins, more so the right. SCAs and PCAs are patent. There is mild to moderate irregularity and stenosis of the left PCA P2 segment. Distal PCA branches are within normal limits. Antegrade flow in both ICA siphons. Siphon irregularity without significant stenosis. Ophthalmic and posterior communicating artery origins are normal. Patent carotid termini. Normal MCA and ACA origins. Tortuous bilateral ACA is. Anterior communicating artery within normal limits. Possible median artery of the corpus callosum. The left ACA is mildly dominant. Bilateral MCA M1 segments are mildly tortuous.  Bilateral M1 segments and MCA bifurcations are patent. Visualized bilateral MCA branches are within normal limits. IMPRESSION: 1. Severe Basilar Artery atherosclerosis and stenosis with RADIOGRAPHIC-STRING-SIGN stenosis of the basilar artery distal third. No intracranial artery occlusion identified. 2. 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 3. Chronic left thalamic lacunar infarct. Moderately advanced for age bilateral cerebral white matter signal changes  most commonly due to chronic small vessel disease. 4. Mild to moderate left PCA P2 segment stenosis. 5. Dominant left vertebral artery, the right functionally terminates in PICA. Electronically Signed   By: Genevie Ann M.D.   On: 05/18/2017 11:02    EKG:   Orders placed or performed during the hospital encounter of 05/17/17  . ED EKG  . ED EKG  . EKG 12-Lead  . EKG 12-Lead  . EKG 12-Lead  . EKG 12-Lead    ASSESSMENT AND PLAN:  1 .acute left pontine stroke: Patient also had chronic left thalamic stroke.  Echo showed EF more than 55%, ultrasound of carotids no hemodynamically significant stenosis, LDL is 67.  Hemoglobin A1c pending.  Patient is on aspirin, Plavix, physical therapy recommended skilled nursing.  Patient agreeable for SNF..   #2 history of CAD.  Continue aspirin, Plavix.  History of anxiety, a lot of stress at home: Continue BuSpar, increased Klonopin.   asPer family request.  #3. essential hypertension:controlled   All the records are reviewed and case discussed with Care Management/Social Workerr. Management plans discussed with the patient, family and they are in agreement.  CODE STATUS:full TOTAL TIME TAKING CARE OF THIS PATIENT: 82minutes.   POSSIBLE D/C IN 1-2DAYS, DEPENDING ON CLINICAL CONDITION.   Epifanio Lesches M.D on 05/20/2017 at 9:42 AM  Between 7am to 6pm - Pager - [phone]  After 6pm go  to [email or web address] - password [email or web address] [facility]  Office  [phone]  CC: Primary care physician; Guadalupe Maple, MD   Note: This dictation was prepared with Dragon dictation along with smaller phrase technology. Any transcriptional errors that result from this process are unintentional.

## 2017-05-20 NOTE — Progress Notes (Signed)
Physical Therapy Treatment Patient Details Name: Todd [name] MRN: [id number] DOB: [birth year] Today's Date: 05/20/2017    History of Present Illness presented to ER with acute onset of difficulty speaking; admitted for acute CVA. MRI significant for L pontine infarct; severe basilar artery stenosis    PT Comments    Pt presents with deficits in strength, transfers, mobility, gait, balance, and activity tolerance.  Pt required extensive cueing for sequencing during transfer training but with cues and practice was able to decrease the amount of assistance to come to standing from Mod A to Min A.  Pt able to amb 1 x 100', 1 x 60' with slow cadence and short B step length with extensive cues for amb closer to RW and to stay within the RW during turns.  Pt required occasional min A to guide RW and for general stability.  Pt will benefit from PT services in a SNF setting upon discharge to safely address above deficits for decreased caregiver assistance and eventual return to PLOF.     Follow Up Recommendations  SNF     Equipment Recommendations  Other (comment)(TBD at next venue of care if pt discharges to SNF)    Recommendations for Other Services       Precautions / Restrictions Precautions Precautions: Fall Restrictions Weight Bearing Restrictions: No    Mobility  Bed Mobility               General bed mobility comments: Pt in recliner this session, bed mobility not assessed  Transfers Overall transfer level: Needs assistance Equipment used: Rolling walker (2 wheeled) Transfers: Sit to/from Stand Sit to Stand: Min assist;Mod assist         General transfer comment: Sit to stand transfer assistance improved from mod A to min A with cues for increased trunk flex and proper hand placement  Ambulation/Gait Ambulation/Gait assistance: Min assist;+2 safety/equipment(Family member followed with w/c but was not required) Ambulation Distance (Feet): 100 Feet Assistive  device: Rolling walker (2 wheeled) Gait Pattern/deviations: Step-through pattern;Decreased step length - right;Decreased step length - left;Trunk flexed   Gait velocity interpretation: <1.8 ft/sec, indicative of risk for recurrent falls General Gait Details: Mod to max verbal cues for amb closer to RW and to stay within RW during turns; min A during amb for general stability and to help guide RW at times   Stairs            Wheelchair Mobility    Modified Rankin (Stroke Patients Only)       Balance Overall balance assessment: Needs assistance Sitting-balance support: No upper extremity supported;Feet supported Sitting balance-Leahy Scale: Good     Standing balance support: Bilateral upper extremity supported;During functional activity Standing balance-Leahy Scale: Poor                              Cognition Arousal/Alertness: Awake/alert Behavior During Therapy: WFL for tasks assessed/performed Overall Cognitive Status: Difficult to assess                                        Exercises Total Joint Exercises Ankle Circles/Pumps: Strengthening;Both;10 reps;15 reps Quad Sets: Strengthening;Both;10 reps Long Arc Quad: AROM;Both;10 reps Knee Flexion: AROM;Both;10 reps Marching in Standing: AROM;Both;10 reps(in sitting) Other Exercises Other Exercises: Static standing with BUE and single UE support with emphasis on upright posture and stability  General Comments        Pertinent Vitals/Pain Pain Assessment: No/denies pain    Home Living                      Prior Function            PT Goals (current goals can now be found in the care plan section)      Frequency    7X/week      PT Plan Current plan remains appropriate    Co-evaluation              AM-PAC PT "6 Clicks" Daily Activity  Outcome Measure                   End of Session Equipment Utilized During Treatment: Gait belt Activity  Tolerance: Patient tolerated treatment well Patient left: in chair;with call bell/phone within reach;with chair alarm set;with family/visitor present Nurse Communication: Mobility status PT Visit Diagnosis: Muscle weakness (generalized) (M62.81);Difficulty in walking, not elsewhere classified (R26.2);Hemiplegia and hemiparesis Hemiplegia - Right/Left: Right Hemiplegia - dominant/non-dominant: Dominant Hemiplegia - caused by: Cerebral infarction     Time: 1435-1459 PT Time Calculation (min) (ACUTE ONLY): 24 min  Charges:  $Gait Training: 8-22 mins $Therapeutic Exercise: 8-22 mins                    G Codes:       DRoyetta [name] PT, DPT 05/20/17, 4:21 PM

## 2017-05-21 DIAGNOSIS — F419 Anxiety disorder, unspecified: Secondary | ICD-10-CM | POA: Diagnosis not present

## 2017-05-21 DIAGNOSIS — R279 Unspecified lack of coordination: Secondary | ICD-10-CM | POA: Diagnosis not present

## 2017-05-21 DIAGNOSIS — F339 Major depressive disorder, recurrent, unspecified: Secondary | ICD-10-CM | POA: Diagnosis not present

## 2017-05-21 DIAGNOSIS — G250 Essential tremor: Secondary | ICD-10-CM | POA: Diagnosis not present

## 2017-05-21 DIAGNOSIS — G5603 Carpal tunnel syndrome, bilateral upper limbs: Secondary | ICD-10-CM | POA: Diagnosis not present

## 2017-05-21 DIAGNOSIS — I679 Cerebrovascular disease, unspecified: Secondary | ICD-10-CM | POA: Diagnosis not present

## 2017-05-21 DIAGNOSIS — F0150 Vascular dementia without behavioral disturbance: Secondary | ICD-10-CM | POA: Diagnosis not present

## 2017-05-21 DIAGNOSIS — M6281 Muscle weakness (generalized): Secondary | ICD-10-CM | POA: Diagnosis not present

## 2017-05-21 DIAGNOSIS — I2510 Atherosclerotic heart disease of native coronary artery without angina pectoris: Secondary | ICD-10-CM | POA: Diagnosis not present

## 2017-05-21 DIAGNOSIS — N400 Enlarged prostate without lower urinary tract symptoms: Secondary | ICD-10-CM | POA: Diagnosis not present

## 2017-05-21 DIAGNOSIS — R482 Apraxia: Secondary | ICD-10-CM | POA: Diagnosis not present

## 2017-05-21 DIAGNOSIS — E119 Type 2 diabetes mellitus without complications: Secondary | ICD-10-CM | POA: Diagnosis not present

## 2017-05-21 DIAGNOSIS — G629 Polyneuropathy, unspecified: Secondary | ICD-10-CM | POA: Diagnosis not present

## 2017-05-21 DIAGNOSIS — M48062 Spinal stenosis, lumbar region with neurogenic claudication: Secondary | ICD-10-CM | POA: Diagnosis not present

## 2017-05-21 DIAGNOSIS — G479 Sleep disorder, unspecified: Secondary | ICD-10-CM | POA: Diagnosis not present

## 2017-05-21 DIAGNOSIS — R262 Difficulty in walking, not elsewhere classified: Secondary | ICD-10-CM | POA: Diagnosis not present

## 2017-05-21 DIAGNOSIS — R1312 Dysphagia, oropharyngeal phase: Secondary | ICD-10-CM | POA: Diagnosis not present

## 2017-05-21 DIAGNOSIS — R4701 Aphasia: Secondary | ICD-10-CM | POA: Diagnosis not present

## 2017-05-21 DIAGNOSIS — F411 Generalized anxiety disorder: Secondary | ICD-10-CM | POA: Diagnosis not present

## 2017-05-21 DIAGNOSIS — I639 Cerebral infarction, unspecified: Secondary | ICD-10-CM | POA: Diagnosis not present

## 2017-05-21 DIAGNOSIS — G459 Transient cerebral ischemic attack, unspecified: Secondary | ICD-10-CM | POA: Diagnosis not present

## 2017-05-21 DIAGNOSIS — Z8673 Personal history of transient ischemic attack (TIA), and cerebral infarction without residual deficits: Secondary | ICD-10-CM | POA: Diagnosis not present

## 2017-05-21 DIAGNOSIS — I10 Essential (primary) hypertension: Secondary | ICD-10-CM | POA: Diagnosis not present

## 2017-05-21 LAB — GLUCOSE, CAPILLARY
Glucose-Capillary: 131 mg/dL — ABNORMAL HIGH (ref 65–99)
Glucose-Capillary: 159 mg/dL — ABNORMAL HIGH (ref 65–99)

## 2017-05-21 MED ORDER — CLONAZEPAM 0.5 MG PO TABS: 0.5000 mg | ORAL_TABLET | Freq: Every day | ORAL | 0 refills | Status: DC | PRN

## 2017-05-21 MED ORDER — CLOPIDOGREL BISULFATE 75 MG PO TABS: 75.0000 mg | ORAL_TABLET | Freq: Every day | ORAL | Status: DC

## 2017-05-21 NOTE — Progress Notes (Signed)
Report called to Rush Landmark, Therapist, sports at [facility] [employer].  Discussed discharge instructions and medications with patient and his 3 daughter's. IV removed. All questions addressed. Patient transported to [facility] via car by his daughters.

## 2017-05-21 NOTE — Discharge Summary (Signed)
[facility] [place] NAME: Todd [name]    MR#:  [id number]  DATE OF BIRTH:  08-Aug-1930  DATE OF ADMISSION:  05/17/2017 ADMITTING PHYSICIAN: Lance Coon, MD  DATE OF DISCHARGE: 05/21/2017  PRIMARY CARE PHYSICIAN: Guadalupe Maple, MD   ADMISSION DIAGNOSIS:  TIA (transient ischemic attack) [G45.9]  DISCHARGE DIAGNOSIS:  Principal Problem:   Aphasia Active Problems:   Hypertension   Diabetes mellitus without complication (HCC)   GERD (gastroesophageal reflux disease)   Anxiety   CAD (coronary artery disease)   Acute CVA (cerebrovascular accident) ([place])   SECONDARY DIAGNOSIS:   Past Medical History:  Diagnosis Date  . Accelerated hypertension 03/04/2015  . Anxiety   . Aortic regurgitation   . Arthritis    osteoarthritis-left knee  . CAD (coronary artery disease)   . Depression   . Diabetes mellitus without complication ([place])   . ED (erectile dysfunction)   . GERD (gastroesophageal reflux disease)   . Glaucoma   . Hyperlipidemia   . Hypertension   . Iron deficiency anemia due to chronic blood loss   . Left heart failure ([place])   . Lumbago    Lumbosacral Neuritis  . Prostate cancer [facility])      ADMITTING HISTORY  HISTORY OF PRESENT ILLNESS:  Todd [name]  is a 83 y.o. male who presents with aphasia.  Patient's family states that he has been having what seems to have been stuttering symptoms for the last several weeks.  They state that he has difficulty getting his words out sometimes, and other times is able to communicate very well.  Patient came in for evaluation tonight as his symptoms got much worse today.  He is currently unable to speak much more than 2-3 words total coherently, only 1 of the time and with great effort to get out 2 or 3 total before he becomes too frustrated to continue trying.  Initial workup in the ED is largely within normal limits, but there is obviously a great concern for possible stroke.  Hospitalist  were called for admission  HOSPITAL COURSE:   * Acute left pontine CVA Echo and carotids showed nothing acute LDL 67 and he is on lipitor Seen by neurology Will be on ASA, Plavix and statin Need SNF for further PT and also speech to follow  * CAD - stable  * HTN Continue home medications  * Anxiety On clonazepam PRN  Stable for discharge to SNF.  CONSULTS OBTAINED:  Treatment Team:  Alexis Goodell, MD  DRUG ALLERGIES:   Allergies  Allergen Reactions  . Fluoxetine Other (See Comments)    headache  . Meloxicam Other (See Comments)    Other reaction(s): Dizziness Nervousness.     DISCHARGE MEDICATIONS:   Allergies as of 05/21/2017      Reactions   Fluoxetine Other (See Comments)   headache   Meloxicam Other (See Comments)   Other reaction(s): Dizziness Nervousness.      Medication List    TAKE these medications   amLODipine 10 MG tablet Commonly known as:  NORVASC Take 1 tablet (10 mg total) by mouth daily. What changed:  how much to take   aspirin EC 81 MG tablet Take 81 mg by mouth daily.   atorvastatin 10 MG tablet Commonly known as:  LIPITOR Take 1 tablet (10 mg total) by mouth daily.   busPIRone 10 MG tablet Commonly known as:  BUSPAR Take 10 mg by mouth at bedtime.   clonazePAM  0.5 MG tablet Commonly known as:  KLONOPIN Take 1 tablet (0.5 mg total) by mouth daily as needed for anxiety.   clopidogrel 75 MG tablet Commonly known as:  PLAVIX Take 1 tablet (75 mg total) by mouth daily. Start taking on:  05/22/2017   DULoxetine 20 MG capsule Commonly known as:  CYMBALTA Take 1 capsule (20 mg total) by mouth daily. What changed:  when to take this   gabapentin 100 MG capsule Commonly known as:  NEURONTIN Take 100 mg by mouth 2 (two) times daily.   latanoprost 0.005 % ophthalmic solution Commonly known as:  XALATAN Place 1 drop into both eyes at bedtime.   metFORMIN 500 MG tablet Commonly known as:  GLUCOPHAGE Take 1 tablet (500  mg total) by mouth daily with breakfast.   pantoprazole 40 MG tablet Commonly known as:  PROTONIX Take 1 tablet (40 mg total) by mouth daily.   propranolol ER 80 MG 24 hr capsule Commonly known as:  INDERAL LA Take 1 capsule (80 mg total) by mouth daily.   telmisartan 80 MG tablet Commonly known as:  MICARDIS Take 1 tablet (80 mg total) by mouth daily.       Today   VITAL SIGNS:  Blood pressure (!) 162/59, pulse 75, temperature 98.1 F (36.7 C), temperature source Axillary, resp. rate 18, height 5\' 6"  (1.676 m), weight 78.5 kg (173 lb), SpO2 96 %.  I/O:    Intake/Output Summary (Last 24 hours) at 05/21/2017 1326 Last data filed at 05/21/2017 1048 Gross per 24 hour  Intake 240 ml  Output 100 ml  Net 140 ml    PHYSICAL EXAMINATION:  Physical Exam  GENERAL:  83 y.o.-year-old patient lying in the bed with no acute distress.  LUNGS: Normal breath sounds bilaterally, no wheezing, rales,rhonchi or crepitation. No use of accessory muscles of respiration.  CARDIOVASCULAR: S1, S2 normal. No murmurs, rubs, or gallops.  ABDOMEN: Soft, non-tender, non-distended. Bowel sounds present. No organomegaly or mass.  NEUROLOGIC: Moves all 4 extremities. PSYCHIATRIC: The patient is alert and awake. Aphasia SKIN: No obvious rash, lesion, or ulcer.   DATA REVIEW:   CBC Recent Labs  Lab 05/18/17 0407  WBC 4.8  HGB 10.2*  HCT 30.6*  PLT 198    Chemistries  Recent Labs  Lab 05/17/17 2241 05/18/17 0407  NA 138  --   K 3.7  --   CL 104  --   CO2 26  --   GLUCOSE 180*  --   BUN 24*  --   CREATININE 1.19 1.00  CALCIUM 9.2  --     Cardiac Enzymes Recent Labs  Lab 05/18/17 0156  TROPONINI 0.03*    Microbiology Results  Results for orders placed or performed in visit on 01/09/17  Microscopic Examination     Status: Abnormal   Collection Time: 01/09/17  1:59 PM  Result Value Ref Range Status   WBC, UA 0-5 0 - 5 /hpf Final   RBC, UA 0-2 0 - 2 /hpf Final   Epithelial  Cells (non renal) 0-10 0 - 10 /hpf Final   Renal Epithel, UA 0-10 (A) None seen /hpf Final   Mucus, UA Present (A) Not Estab. Final   Bacteria, UA None seen None seen/Few Final    RADIOLOGY:  No results found.  Follow up with PCP in 1 week.  Management plans discussed with the patient, family and they are in agreement.  CODE STATUS:     Code Status Orders  (From  admission, onward)        Start     Ordered   05/18/17 0304  Full code  Continuous     05/18/17 0303    Code Status History    Date Active Date Inactive Code Status Order ID Comments User Context   03/17/2017 04:19 03/17/2017 21:09 Full Code [id number]  Saundra Shelling, MD ED   03/05/2015 00:19 03/05/2015 15:07 Full Code [id number]  Lance Coon, MD Inpatient      TOTAL TIME TAKING CARE OF THIS PATIENT ON DAY OF DISCHARGE: more than 30 minutes.   Todd [name] M.D on 05/21/2017 at 1:26 PM  Between 7am to 6pm - Pager - [phone]  After 6pm go to [email or web address] - password [email or web address] [facility]  Office  [phone]  CC: Primary care physician; Guadalupe Maple, MD  Note: This dictation was prepared with Dragon dictation along with smaller phrase technology. Any transcriptional errors that result from this process are unintentional.

## 2017-05-21 NOTE — Plan of Care (Signed)
VSS, free of falls during shift.  Reported chronic back pain 10/10, improved to 6/10 w/ PRN PO Tylenol 650mg .  No other needs overnight.  NIH 10, neuro check stable.  Q2h turns for skin integrity.  Bed in low position, bed alarm on.  Call bell within reach, [place].

## 2017-05-21 NOTE — Clinical Social Work Placement (Signed)
   CLINICAL SOCIAL WORK PLACEMENT  NOTE  Date:  05/21/2017  Patient Details  Name: Todd [name] MRN: [id number] Date of Birth: 29-Jan-1931  Clinical Social Work is seeking post-discharge placement for this patient at the [place] level of care (*CSW will initial, date and re-position this form in  chart as items are completed):  Yes   Patient/family provided with [facility] Work Department's list of facilities offering this level of care within the geographic area requested by the patient (or if unable, by the patient's family).  Yes   Patient/family informed of their freedom to choose among providers that offer the needed level of care, that participate in Medicare, Medicaid or managed care program needed by the patient, have an available bed and are willing to accept the patient.  Yes   Patient/family informed of [facility]'s ownership interest in [facility] and [facility], as well as of the fact that they are under no obligation to receive care at these facilities.  PASRR submitted to EDS on 05/19/17     PASRR number received on 05/19/17     Existing PASRR number confirmed on       FL2 transmitted to all facilities in geographic area requested by pt/family on 05/19/17     FL2 transmitted to all facilities within larger geographic area on       Patient informed that his/her managed care company has contracts with or will negotiate with certain facilities, including the following:        Yes   Patient/family informed of bed offers received.  Patient chooses bed at [facility]     Physician recommends and patient chooses bed at      Patient to be transferred to [facility] on 05/21/17.  Patient to be transferred to facility by Daughter's car     Patient family notified on 05/21/17 of transfer.  Name of family member notified:  Daughter [name], [place] Please sign FL2      Additional Comment:    _______________________________________________ Ross Ludwig, LCSWA 05/21/2017, 1:53 PM

## 2017-05-21 NOTE — Care Management Important Message (Signed)
Important Message  Patient Details  Name: Huy Majid MRN: [id number] Date of Birth: 02/18/1931   Medicare Important Message Given:  Yes    [name], RN 05/21/2017, 8:28 AM

## 2017-05-21 NOTE — Discharge Instructions (Signed)
Heart healthy diet  Activity with assistance

## 2017-05-21 NOTE — Progress Notes (Signed)
Physical Therapy Treatment Patient Details Name: Todd [name] MRN: [id number] DOB: Jun 11, 1930 Today's Date: 05/21/2017    History of Present Illness presented to ER with acute onset of difficulty speaking; admitted for acute CVA. MRI significant for L pontine infarct; severe basilar artery stenosis    PT Comments    Pt agreeable to PT; denies pain. Pt continues to require Mod A, heavy at times with STS transfer performed several times between seated and stand exercises. Difficulty coordinating and finding COG during transition. Min to Mod A for stability with stand exercises. At the time of this document, pt preparing for discharge to skilled nursing facility to continue rehab efforts.    Follow Up Recommendations  SNF     Equipment Recommendations       Recommendations for Other Services       Precautions / Restrictions Precautions Precautions: Fall Restrictions Weight Bearing Restrictions: No    Mobility  Bed Mobility               General bed mobility comments: Not assessed  Transfers Overall transfer level: Needs assistance Equipment used: Rolling walker (2 wheeled) Transfers: Sit to/from Stand Sit to Stand: Mod assist         General transfer comment: Assist to find COG and steady; less A once in stand   Ambulation/Gait                 Stairs            Wheelchair Mobility    Modified Rankin (Stroke Patients Only)       Balance                                            Cognition Arousal/Alertness: Awake/alert Behavior During Therapy: WFL for tasks assessed/performed Overall Cognitive Status: Within Functional Limits for tasks assessed                                        Exercises General Exercises - Lower Extremity Long Arc QuadSinclair Ship;Right;10 reps;Seated(AROM L) Hip ABduction/ADduction: AROM;Strengthening;Both;10 reps;Standing Straight Leg Raises: AROM;Strengthening;Both;10  reps;Standing Hip Flexion/Marching: AROM;Strengthening;10 reps;Standing(also in sit) Toe Raises: AROM;AAROM;Both;10 reps;Seated Heel Raises: AROM;AAROM;Both;10 reps;Seated Mini-Sqauts: Strengthening;10 reps    General Comments        Pertinent Vitals/Pain Pain Assessment: No/denies pain    Home Living                      Prior Function            PT Goals (current goals can now be found in the care plan section) Progress towards PT goals: Progressing toward goals    Frequency    7X/week      PT Plan Current plan remains appropriate    Co-evaluation              AM-PAC PT "6 Clicks" Daily Activity  Outcome Measure  Difficulty turning over in bed (including adjusting bedclothes, sheets and blankets)?: Unable Difficulty moving from lying on back to sitting on the side of the bed? : Unable Difficulty sitting down on and standing up from a chair with arms (e.g., wheelchair, bedside commode, etc,.)?: Unable Help needed moving to and from a bed to chair (including a wheelchair)?: A Little Help needed walking  in hospital room?: A Lot Help needed climbing 3-5 steps with a railing? : A Lot 6 Click Score: 10    End of Session Equipment Utilized During Treatment: Gait belt Activity Tolerance: Patient tolerated treatment well Patient left: in bed;with call bell/phone within reach;with bed alarm set   PT Visit Diagnosis: Muscle weakness (generalized) (M62.81);Difficulty in walking, not elsewhere classified (R26.2);Hemiplegia and hemiparesis Hemiplegia - Right/Left: Right Hemiplegia - dominant/non-dominant: Dominant Hemiplegia - caused by: Cerebral infarction     Time: 0340-3524 PT Time Calculation (min) (ACUTE ONLY): 30 min  Charges:  $Therapeutic Exercise: 23-37 mins                    G Codes:  Functional Assessment Tool Used: AM-PAC 6 Clicks Basic Mobility     Larae Grooms, PTA 05/21/2017, 3:27 PM

## 2017-05-21 NOTE — Clinical Social Work Note (Addendum)
CSW presented bed offers, to patient's daughter and she chose [facility].  CSW contacted [facility] SNF, and they can accept patient once insurance has been approved.  CSW contacted insurance company awaiting approval for SNF.  1:30pm  CSW received insurance authorization for patient to go to SNF, auth number is [id number].  Patient to be d/c'ed today to [facility].  Patient and family agreeable to plans will transport via daughter's car RN to call report room E6 [phone].  Jones Broom. Norval Morton, MSW, [place]  05/21/2017 12:43 PM

## 2017-05-26 DIAGNOSIS — E119 Type 2 diabetes mellitus without complications: Secondary | ICD-10-CM | POA: Diagnosis not present

## 2017-05-26 DIAGNOSIS — N400 Enlarged prostate without lower urinary tract symptoms: Secondary | ICD-10-CM | POA: Diagnosis not present

## 2017-05-26 DIAGNOSIS — M48062 Spinal stenosis, lumbar region with neurogenic claudication: Secondary | ICD-10-CM | POA: Diagnosis not present

## 2017-05-26 DIAGNOSIS — F339 Major depressive disorder, recurrent, unspecified: Secondary | ICD-10-CM | POA: Diagnosis not present

## 2017-05-30 DIAGNOSIS — G479 Sleep disorder, unspecified: Secondary | ICD-10-CM | POA: Diagnosis not present

## 2017-05-30 DIAGNOSIS — F411 Generalized anxiety disorder: Secondary | ICD-10-CM | POA: Diagnosis not present

## 2017-05-30 DIAGNOSIS — G5603 Carpal tunnel syndrome, bilateral upper limbs: Secondary | ICD-10-CM | POA: Diagnosis not present

## 2017-05-30 DIAGNOSIS — G250 Essential tremor: Secondary | ICD-10-CM | POA: Diagnosis not present

## 2017-05-30 DIAGNOSIS — Z8673 Personal history of transient ischemic attack (TIA), and cerebral infarction without residual deficits: Secondary | ICD-10-CM | POA: Diagnosis not present

## 2017-05-30 DIAGNOSIS — F0150 Vascular dementia without behavioral disturbance: Secondary | ICD-10-CM | POA: Diagnosis not present

## 2017-05-30 DIAGNOSIS — G629 Polyneuropathy, unspecified: Secondary | ICD-10-CM | POA: Diagnosis not present

## 2017-06-07 ENCOUNTER — Telehealth: Payer: Self-pay | Admitting: Family Medicine

## 2017-06-07 NOTE — Telephone Encounter (Signed)
Attempted to reach daughter. Line is not working. Will attempt again later.

## 2017-06-07 NOTE — Telephone Encounter (Signed)
Copied from [place]. Topic: Quick Communication - See Telephone Encounter >> Jun 07, 2017  8:34 AM Hewitt Shorts wrote: CRM for notification. See Telephone encounter for: pt daughter is needing to talk to [name] regarding her father being in [facility]hab [facility] and extending his physical therapy and speech therapy   Best number to call is   06/07/17. >> Jun 07, 2017  9:27 AM Ahmed Prima L wrote: Please call daughter concerning her father being in the rehab. The 20 days is about to run out. She needs him to stay longer please call her @ [phone]

## 2017-06-07 NOTE — Telephone Encounter (Signed)
Spoke with daughter, advised that provider is out of office until Monday. Daughter stated that she wanted [name] to write a letter for her dad for continued therapy. Advised that PT/OT normally evaluate and send request to [name] to sign off on. We have also not seen pt since TIA. Daughter will contact neurologist and PT to see if they will send over order request.

## 2017-06-08 ENCOUNTER — Other Ambulatory Visit: Payer: Self-pay | Admitting: *Deleted

## 2017-06-08 NOTE — Patient Outreach (Signed)
[name]) Care Management  [facility] Social Work  06/08/2017  Todd [name] 06/07/1930 [id number]  Subjective:  Patient is a 84 year old male, currently in rehab at the [facility] following a stroke. This Education officer, museum met with patient in his room, however patient had increased diificulty putting his sentences together and could only shake his head yes to questions posed.  This Education officer, museum met with the discharge planner, Noreene Larsson who reports observing the same behavior. Patient working with Speech Therapy in addition to OT and PT. Per the discharge planner, patient's last covered day is 06/10/17, however patient's family would like patient to remain in rehab for more treatment. They are filing an appeal and have applied for Medicaid to help cover the stay.   Objective:   Encounter Medications:  Outpatient Encounter Medications as of 06/08/2017  Medication Sig  . amLODipine (NORVASC) 10 MG tablet Take 1 tablet (10 mg total) by mouth daily. (Patient taking differently: Take 5 mg by mouth daily. )  . aspirin EC 81 MG tablet Take 81 mg by mouth daily.  [name] Kitchen atorvastatin (LIPITOR) 10 MG tablet Take 1 tablet (10 mg total) by mouth daily.  . busPIRone (BUSPAR) 10 MG tablet Take 10 mg by mouth at bedtime.   . clonazePAM (KLONOPIN) 0.5 MG tablet Take 1 tablet (0.5 mg total) by mouth daily as needed for anxiety.  . clopidogrel (PLAVIX) 75 MG tablet Take 1 tablet (75 mg total) by mouth daily.  . DULoxetine (CYMBALTA) 20 MG capsule Take 1 capsule (20 mg total) by mouth daily. (Patient taking differently: Take 20 mg by mouth at bedtime. )  . gabapentin (NEURONTIN) 100 MG capsule Take 100 mg by mouth 2 (two) times daily.   [name] Kitchen latanoprost (XALATAN) 0.005 % ophthalmic solution Place 1 drop into both eyes at bedtime.   . metFORMIN (GLUCOPHAGE) 500 MG tablet Take 1 tablet (500 mg total) by mouth daily with breakfast.  . pantoprazole (PROTONIX) 40 MG tablet Take 1 tablet (40 mg total) by mouth  daily.  . propranolol ER (INDERAL LA) 80 MG 24 hr capsule Take 1 capsule (80 mg total) by mouth daily.  [name] Kitchen telmisartan (MICARDIS) 80 MG tablet Take 1 tablet (80 mg total) by mouth daily.   No facility-administered encounter medications on file as of 06/08/2017.     Functional Status:  In your present state of health, do you have any difficulty performing the following activities: 05/18/2017 03/17/2017  Hearing? N N  Vision? N N  Difficulty concentrating or making decisions? N N  Walking or climbing stairs? Y Y  Dressing or bathing? N N  Comment only trouble shaving do to hands shaking" -  Doing errands, shopping? Y N  Preparing Food and eating ? - -  Using the Toilet? - -  In the past six months, have you accidently leaked urine? - -  Do you have problems with loss of bowel control? - -  Managing your Medications? - -  Managing your Finances? - -  Housekeeping or managing your Housekeeping? - -  Some recent data might be hidden    Fall/Depression Screening:  PHQ 2/9 Scores 05/07/2017 03/26/2017 10/25/2016 09/27/2016 10/11/2015  PHQ - 2 Score 0 0 1 2 2   PHQ- 9 Score - - 6 9 11     Assessment:  Patient's family feel that patient needs more rehab and have filed an appeal as well as applied for Medicaid on patient's behalf. It was suggested that this social worker speak  to patient's daughter to provide additional assistance regarding patient's care plan.  Plan: This social worker will contact patient's daughter Todd [name] to discuss patient's discharge needs.    [name] [facility] Care Management [phone]

## 2017-06-11 ENCOUNTER — Ambulatory Visit: Payer: Self-pay | Admitting: Family Medicine

## 2017-06-11 DIAGNOSIS — F418 Other specified anxiety disorders: Secondary | ICD-10-CM | POA: Diagnosis not present

## 2017-06-11 DIAGNOSIS — I2510 Atherosclerotic heart disease of native coronary artery without angina pectoris: Secondary | ICD-10-CM | POA: Diagnosis not present

## 2017-06-11 DIAGNOSIS — M7989 Other specified soft tissue disorders: Secondary | ICD-10-CM | POA: Diagnosis not present

## 2017-06-11 DIAGNOSIS — I10 Essential (primary) hypertension: Secondary | ICD-10-CM | POA: Diagnosis not present

## 2017-06-11 DIAGNOSIS — E119 Type 2 diabetes mellitus without complications: Secondary | ICD-10-CM | POA: Diagnosis not present

## 2017-06-12 ENCOUNTER — Telehealth: Payer: Self-pay | Admitting: Family Medicine

## 2017-06-12 ENCOUNTER — Encounter: Payer: Self-pay | Admitting: *Deleted

## 2017-06-12 ENCOUNTER — Other Ambulatory Visit: Payer: Self-pay | Admitting: *Deleted

## 2017-06-12 DIAGNOSIS — E119 Type 2 diabetes mellitus without complications: Secondary | ICD-10-CM

## 2017-06-12 NOTE — Patient Outreach (Signed)
[facility]) Care Management  06/12/2017  Todd [name] September 07, 1930 [id number]   Phone call to [facility] to check discharge planning status for patient. Discharge planner had already left for the day, however the front office staff stated that patient will be discharging home today with his daughter.   Plan: [place] notified           This social worker to follow up with patient's      daughter to assess for community resource needs.   [name] [facility] Care Management [phone]

## 2017-06-12 NOTE — Telephone Encounter (Signed)
Copied from [place] [phone]. >> Jun 12, 2017  9:52 AM Neva Seat wrote: Kaci Freel [phone] - pt's daughter called saying her Father needs a diabetic meter - to check his glucose.  Please call her back to discuss which one her father will need.

## 2017-06-12 NOTE — Telephone Encounter (Signed)
Copied from [place] [phone]. Topic: General - Other >> Jun 12, 2017  9:52 AM Neva Seat wrote: Todd [name] [phone] - pt's daughter called saying her Father needs a diabetic meter - to check his glucose.  Please call her back to discuss which one her father will need.

## 2017-06-12 NOTE — Telephone Encounter (Signed)
Caller name: Verdun Rackley Relation to pt: daughter  Call back number: [phone]  Work [phone]  Pharmacy: [place], [place] - [place] [phone] (Phone) [phone] (Fax)      Reason for call:  Daughter calling back to confirm PCP is aware patient is need of the entire kit, strips, needles, monitor

## 2017-06-12 NOTE — Telephone Encounter (Signed)
Pt.s daughter Stopped by office to check on the status of diabetic kit supplies for pt. Confirmed with pt.'s daughter that pt. Is needing a whole supplies kit.

## 2017-06-13 ENCOUNTER — Ambulatory Visit (INDEPENDENT_AMBULATORY_CARE_PROVIDER_SITE_OTHER): Payer: PPO | Admitting: Family Medicine

## 2017-06-13 ENCOUNTER — Other Ambulatory Visit: Payer: Self-pay | Admitting: *Deleted

## 2017-06-13 ENCOUNTER — Encounter: Payer: Self-pay | Admitting: Family Medicine

## 2017-06-13 DIAGNOSIS — E119 Type 2 diabetes mellitus without complications: Secondary | ICD-10-CM

## 2017-06-13 DIAGNOSIS — I639 Cerebral infarction, unspecified: Secondary | ICD-10-CM | POA: Diagnosis not present

## 2017-06-13 DIAGNOSIS — I10 Essential (primary) hypertension: Secondary | ICD-10-CM | POA: Diagnosis not present

## 2017-06-13 MED ORDER — ACCU-CHEK SOFT TOUCH LANCETS MISC: 12 refills | Status: DC

## 2017-06-13 MED ORDER — HYDROCHLOROTHIAZIDE 25 MG PO TABS: 25.0000 mg | ORAL_TABLET | Freq: Every day | ORAL | 3 refills | Status: DC

## 2017-06-13 MED ORDER — ACCU-CHEK AVIVA PLUS W/DEVICE KIT: PACK | 0 refills | Status: DC

## 2017-06-13 NOTE — Assessment & Plan Note (Signed)
Discussed no need to check blood sugars on a frequent basis and just take metformin.

## 2017-06-13 NOTE — Telephone Encounter (Signed)
ok 

## 2017-06-13 NOTE — Assessment & Plan Note (Signed)
Discuss hypertension side effects will stop  Amlodipine and start hydrochlorothiazide observeblood pressurerecheck 1-2 months

## 2017-06-13 NOTE — Telephone Encounter (Signed)
This encounter was created in error - please disregard.

## 2017-06-13 NOTE — Telephone Encounter (Signed)
RX sent in. Pt schedule to see provider tonight.

## 2017-06-13 NOTE — Assessment & Plan Note (Signed)
[facility]  patientdoing better

## 2017-06-13 NOTE — Patient Outreach (Addendum)
[facility]) Care Management  06/13/2017  Munir Victorian 07/22/1930 [id number]  Referral Source : [employer]. Chart Reviewed.  84 yo male recent admission to [facility] on 12/13, Dx:  CVA, discharged on 12/17 to [facility] SN[facility] Discharged from [facility] on 06/12/17 PMHx : Diabetes, CVA, Aphasia, Hypertension, CAD, Anxiety,Hyperlipedemia  Unsuccessful initial call to patient to introduce  [facility] care management services,for transition of care,  called placed to number listed for patient contact, voicemail message stated that contact number is that of patient daughter Lucio Litsey, able to leave a HIPAA compliant message requesting a return call.   Plan  Will await return call, if no response will attempt call in the next business day.  1430 Received return call from Cain Saupe, daughter of patient and listed in Epic as Emergency contact. Explained reason for the call, introduced [facility] care management program. [name] states this is not a good time to talk, request call at later time and agreed upon receiving return call on next day.    [name], RN, [place] [facility] Management Coordinator  [phone]- Mobile [phone]- Toll Free Main Office

## 2017-06-13 NOTE — Progress Notes (Signed)
BP (!) 167/75   Pulse (!) 59   Wt 171 lb (77.6 kg)   SpO2 98%   BMI 27.60 kg/m    Subjective:    Patient ID: Todd [name], male    DOB: 03/25/1931, 84 y.o.   MRN: [id number]  HPI: Todd [name] is a 84 y.o. male  Chief Complaint  Patient presents with  . Cerebrovascular Accident    Right sided, right before christmas  patient here with his sisters concerned about swelling of his ankles especially his right side started after increasing amlodipine to 10 mg. Also concerned about insulin dosing as patient has been precarious finger probably is in the hospital stay A1c previously has been goodwith no need for insulin. Patient hasn't been on insulin for some time Otherwise doing better tremors shakes except for blood pressure.  Relevant past medical, surgical, family and social history reviewed and updated as indicated. Interim medical history since our last visit reviewed. Allergies and medications reviewed and updated.  Review of Systems  Constitutional: Negative.   Respiratory: Negative.   Cardiovascular: Negative.     Per HPI unless specifically indicated above     Objective:    BP (!) 167/75   Pulse (!) 59   Wt 171 lb (77.6 kg)   SpO2 98%   BMI 27.60 kg/m   Wt Readings from Last 3 Encounters:  06/13/17 171 lb (77.6 kg)  05/18/17 173 lb (78.5 kg)  05/07/17 176 lb (79.8 kg)    Physical Exam  Constitutional: He is oriented to person, place, and time. He appears well-developed and well-nourished.  HENT:  Head: Normocephalic and atraumatic.  Eyes: Conjunctivae and EOM are normal.  Neck: Normal range of motion.  Cardiovascular: Normal rate, regular rhythm and normal heart sounds.  Pulmonary/Chest: Effort normal and breath sounds normal.  Musculoskeletal: Normal range of motion.  Neurological: He is alert and oriented to person, place, and time.  Skin: No erythema.  Psychiatric: He has a normal mood and affect. His behavior is normal. Judgment and thought content  normal.    Results for orders placed or performed during the hospital encounter of 53/61/44  Basic metabolic panel  Result Value Ref Range   Sodium 138 135 - 145 mmol/L   Potassium 3.7 3.5 - 5.1 mmol/L   Chloride 104 101 - 111 mmol/L   CO2 26 22 - 32 mmol/L   Glucose, Bld 180 (H) 65 - 99 mg/dL   BUN 24 (H) 6 - 20 mg/dL   Creatinine, Ser 1.19 0.61 - 1.24 mg/dL   Calcium 9.2 8.9 - 10.3 mg/dL   GFR calc non Af Amer 54 (L) >60 mL/min   GFR calc Af Amer >60 >60 mL/min   Anion gap 8 5 - 15  CBC  Result Value Ref Range   WBC 4.9 3.8 - 10.6 K/uL   RBC 3.57 (L) 4.40 - 5.90 MIL/uL   Hemoglobin 11.0 (L) 13.0 - 18.0 g/dL   HCT 33.7 (L) 40.0 - 52.0 %   MCV 94.3 80.0 - 100.0 fL   MCH 30.9 26.0 - 34.0 pg   MCHC 32.7 32.0 - 36.0 g/dL   RDW 13.4 11.5 - 14.5 %   Platelets 231 150 - 440 K/uL  Urinalysis, Complete w Microscopic  Result Value Ref Range   Color, Urine YELLOW (A) YELLOW   APPearance CLEAR (A) CLEAR   Specific Gravity, Urine 1.015 1.005 - 1.030   pH 5.0 5.0 - 8.0   Glucose, UA NEGATIVE NEGATIVE mg/dL  Hgb urine dipstick NEGATIVE NEGATIVE   Bilirubin Urine NEGATIVE NEGATIVE   Ketones, ur NEGATIVE NEGATIVE mg/dL   Protein, ur NEGATIVE NEGATIVE mg/dL   Nitrite NEGATIVE NEGATIVE   Leukocytes, UA NEGATIVE NEGATIVE   RBC / HPF 0-5 0 - 5 RBC/hpf   WBC, UA 0-5 0 - 5 WBC/hpf   Bacteria, UA NONE SEEN NONE SEEN   Squamous Epithelial / LPF 0-5 (A) NONE SEEN   Mucus PRESENT   Troponin I  Result Value Ref Range   Troponin I 0.03 (HH) <0.03 ng/mL  Troponin I  Result Value Ref Range   Troponin I 0.03 (HH) <0.03 ng/mL  Glucose, capillary  Result Value Ref Range   Glucose-Capillary 124 (H) 65 - 99 mg/dL  Hemoglobin A1c  Result Value Ref Range   Hgb A1c MFr Bld 6.3 (H) 4.8 - 5.6 %   Mean Plasma Glucose 134 mg/dL  Lipid panel  Result Value Ref Range   Cholesterol 122 0 - 200 mg/dL   Triglycerides 97 <150 mg/dL   HDL 36 (L) >40 mg/dL   Total CHOL/HDL Ratio 3.4 RATIO   VLDL 19 0  - 40 mg/dL   LDL Cholesterol 67 0 - 99 mg/dL  CBC  Result Value Ref Range   WBC 4.8 3.8 - 10.6 K/uL   RBC 3.25 (L) 4.40 - 5.90 MIL/uL   Hemoglobin 10.2 (L) 13.0 - 18.0 g/dL   HCT 30.6 (L) 40.0 - 52.0 %   MCV 94.2 80.0 - 100.0 fL   MCH 31.3 26.0 - 34.0 pg   MCHC 33.3 32.0 - 36.0 g/dL   RDW 13.4 11.5 - 14.5 %   Platelets 198 150 - 440 K/uL  Creatinine, serum  Result Value Ref Range   Creatinine, Ser 1.00 0.61 - 1.24 mg/dL   GFR calc non Af Amer >60 >60 mL/min   GFR calc Af Amer >60 >60 mL/min  Glucose, capillary  Result Value Ref Range   Glucose-Capillary 122 (H) 65 - 99 mg/dL  Glucose, capillary  Result Value Ref Range   Glucose-Capillary 152 (H) 65 - 99 mg/dL  Glucose, capillary  Result Value Ref Range   Glucose-Capillary 156 (H) 65 - 99 mg/dL  Glucose, capillary  Result Value Ref Range   Glucose-Capillary 118 (H) 65 - 99 mg/dL  Glucose, capillary  Result Value Ref Range   Glucose-Capillary 126 (H) 65 - 99 mg/dL  Glucose, capillary  Result Value Ref Range   Glucose-Capillary 169 (H) 65 - 99 mg/dL  Glucose, capillary  Result Value Ref Range   Glucose-Capillary 156 (H) 65 - 99 mg/dL  Glucose, capillary  Result Value Ref Range   Glucose-Capillary 137 (H) 65 - 99 mg/dL  Glucose, capillary  Result Value Ref Range   Glucose-Capillary 123 (H) 65 - 99 mg/dL  Glucose, capillary  Result Value Ref Range   Glucose-Capillary 153 (H) 65 - 99 mg/dL  Glucose, capillary  Result Value Ref Range   Glucose-Capillary 161 (H) 65 - 99 mg/dL  Glucose, capillary  Result Value Ref Range   Glucose-Capillary 172 (H) 65 - 99 mg/dL  Glucose, capillary  Result Value Ref Range   Glucose-Capillary 131 (H) 65 - 99 mg/dL  Glucose, capillary  Result Value Ref Range   Glucose-Capillary 159 (H) 65 - 99 mg/dL  ECHOCARDIOGRAM COMPLETE  Result Value Ref Range   Weight 2,768 oz   Height 66 in   BP 135/73 mmHg      Assessment & Plan:   Problem List Items  Addressed This Visit       Cardiovascular and Mediastinum   Hypertension    Discuss hypertension side effects will stop  Amlodipine and start hydrochlorothiazide observeblood pressurerecheck 1-2 months      Relevant Medications   hydrochlorothiazide (HYDRODIURIL) 25 MG tablet   Acute CVA (cerebrovascular accident) ([employer])    Has physical therapy coming to hitherapy  patientdoing better      Relevant Medications   hydrochlorothiazide (HYDRODIURIL) 25 MG tablet     Endocrine   Diabetes mellitus without complication ([place])    Discussed no need to check blood sugars on a frequent basis and just take metformin.          Follow up plan: Return in about 2 months (around 08/11/2017) for Hemoglobin A1c, BMP,  Lipids, ALT, AST.

## 2017-06-14 ENCOUNTER — Other Ambulatory Visit: Payer: Self-pay | Admitting: *Deleted

## 2017-06-14 NOTE — Patient Outreach (Signed)
[place] [facility]) Care Management  06/14/2017  Todd [name] Jan 08, 1931 [id number]   Telephone assessment call  Unsuccessful attempt to contact patient, placed call to contact number listed, voicemail message from [facility], listed as patient emergency contact and daughter. Able to leave a HIPAA compliant message with return call number.  1500  Placed call to Todd [name], daughter  as previously suggested by SNF discharge planner while patient in rehab . Spoke with Todd [name] , HIPAA information verified,she suggested that I speak with Todd [name] stating she has Power of [employer]. She discussed Todd [name] is working at this time and does not get off work until later, she will give her my name and number to return call .   Plan  Will await return call.   Todd Draft, RN, [place] Management Coordinator  [phone]- Mobile [phone]- Toll Free Main Office

## 2017-06-15 ENCOUNTER — Other Ambulatory Visit: Payer: Self-pay | Admitting: *Deleted

## 2017-06-15 NOTE — Patient Outreach (Signed)
[place] [facility]) Care Management  06/15/2017  Todd [name] 1930-08-04 [id number]  Transition of care call  Referral Source : [employer]. 84 yo male recent admission to [facility] on 12/13, Dx:  CVA, discharged on 12/17 to [facility] SNF Discharged from [place] on 06/12/17 PMHx : Diabetes, CVA, Aphasia, Hypertension, CAD, Anxiety,Hyperlipedemia .    Returned call from [employer] from [employer], HIPAA information verified. Explained reason for the call,[facility] care management services, she is agreeable to accepting services for patient, her father.   Todd [name] discussed that she is not power of attorney , but she and sister Todd [name] make decisions regarding Todd [name]. Todd [name] states patient in not able to communicate over the phone, that he speech is still very slow since the stroke.   Conditions  Recent CVA/Hypertension Daughter discussed patient continues to have difficulty mostly with speech after recent  Patient has attended post discharge  visit with PCP, post hospital visit with neurologist, Dr. Manuella Ghazi.  Daughter discussed patient history of hypertension and recent medication change. Patient does not have working monitor at home. Reviewed, no new stroke symptoms reported.    Recent Discharge  Daughter discussed [facility] home health has contacted her regarding arranging initial visit, she plans to return call to them on today.  Medications Daughter unable to review medication list at this time. She denies cost concern regarding affording medications. She reports family assist patient with medication daily. Social Patient currently lives at home with his son and daughter in [employer] [place]. Per report son is able to assist patient with daily care. Daughter discussed they have a transport chair at home that they can use, if needed, but that it needs a wheel repair. Daughter report neurologist has recommended a Ustep walker and that is what she wants to check into getting.  Daughter  reports patient appetite is good, denies problems with swallowing foods.   Patient daughter Todd [name] is agreeable to home visit but requested that I call her sister Todd [name] to arrange.   Placed call to patient daughter Todd [name] to discuss [facility] care management , transition of care program , agreeable to scheduling home visit in the next week. Provided [facility] care management contact information and 24 nurse line number.  Discussed initial inquiry call to local agency with DME equipment regarding Ustep walker, called [facility] they do not stock and ustep walker not on inventory list to order.   Plan Will continue to follow for weekly  transition of care outreaches, schedule home visit in the next week, will provide blood pressure monitoring education, Advanced directive packet.   Will follow up with neurology office regarding patient/family request for ustep walker. Will send PCP barrier letter of involvement .   [facility] CM Care Plan Problem One     Most Recent Value  Care Plan Problem One  Recent hospital admission, rehab stay related to new stroke   Role Documenting the Problem One  Care Management [place] for Problem One  Active  [facility] [place] Long Term Goal   Patient will not experience in the next 31 days  [facility] Long Term Goal Start Date  06/15/17  Interventions for Problem One Long Term Goal  RNCM discussed current clinical status , for new stroke symptoms advised regarding taking medications as prescribed,  [facility] CM Short Term Goal #1   Patient/family will report obtaining Ustep walker for use in the next 30 days   [facility] CM Short Term Goal #1 Start Date  06/15/17  Interventions  for Short Term Goal #1  RNCM will place call to neurologist office to follow up on walker recommendations   [facility] CM Short Term Goal #2   Patient/family will report being able to monitor patient blood pressure readings at home in the next 30 days   [facility] CM Short Term Goal #2 Start Date  06/15/17   Interventions for Short Term Goal #2  RNCM will access [facility] resources for blood pressure monitor for patient use and provide monitor and education at home [place], RN, [place] Management Coordinator  [phone]- Mobile [phone]- [place]

## 2017-06-19 DIAGNOSIS — M1990 Unspecified osteoarthritis, unspecified site: Secondary | ICD-10-CM | POA: Diagnosis not present

## 2017-06-19 DIAGNOSIS — I10 Essential (primary) hypertension: Secondary | ICD-10-CM | POA: Diagnosis not present

## 2017-06-19 DIAGNOSIS — F329 Major depressive disorder, single episode, unspecified: Secondary | ICD-10-CM | POA: Diagnosis not present

## 2017-06-19 DIAGNOSIS — I69320 Aphasia following cerebral infarction: Secondary | ICD-10-CM | POA: Diagnosis not present

## 2017-06-19 DIAGNOSIS — Z8546 Personal history of malignant neoplasm of prostate: Secondary | ICD-10-CM | POA: Diagnosis not present

## 2017-06-19 DIAGNOSIS — E785 Hyperlipidemia, unspecified: Secondary | ICD-10-CM | POA: Diagnosis not present

## 2017-06-19 DIAGNOSIS — Z7982 Long term (current) use of aspirin: Secondary | ICD-10-CM | POA: Diagnosis not present

## 2017-06-19 DIAGNOSIS — I69321 Dysphasia following cerebral infarction: Secondary | ICD-10-CM | POA: Diagnosis not present

## 2017-06-19 DIAGNOSIS — Z7902 Long term (current) use of antithrombotics/antiplatelets: Secondary | ICD-10-CM | POA: Diagnosis not present

## 2017-06-19 DIAGNOSIS — Z7984 Long term (current) use of oral hypoglycemic drugs: Secondary | ICD-10-CM | POA: Diagnosis not present

## 2017-06-19 DIAGNOSIS — I2510 Atherosclerotic heart disease of native coronary artery without angina pectoris: Secondary | ICD-10-CM | POA: Diagnosis not present

## 2017-06-19 DIAGNOSIS — Z9181 History of falling: Secondary | ICD-10-CM | POA: Diagnosis not present

## 2017-06-19 DIAGNOSIS — K219 Gastro-esophageal reflux disease without esophagitis: Secondary | ICD-10-CM | POA: Diagnosis not present

## 2017-06-19 DIAGNOSIS — E119 Type 2 diabetes mellitus without complications: Secondary | ICD-10-CM | POA: Diagnosis not present

## 2017-06-19 DIAGNOSIS — I69322 Dysarthria following cerebral infarction: Secondary | ICD-10-CM | POA: Diagnosis not present

## 2017-06-19 DIAGNOSIS — I69351 Hemiplegia and hemiparesis following cerebral infarction affecting right dominant side: Secondary | ICD-10-CM | POA: Diagnosis not present

## 2017-06-19 DIAGNOSIS — D500 Iron deficiency anemia secondary to blood loss (chronic): Secondary | ICD-10-CM | POA: Diagnosis not present

## 2017-06-19 DIAGNOSIS — F419 Anxiety disorder, unspecified: Secondary | ICD-10-CM | POA: Diagnosis not present

## 2017-06-21 ENCOUNTER — Ambulatory Visit: Payer: Self-pay | Admitting: *Deleted

## 2017-06-21 ENCOUNTER — Other Ambulatory Visit: Payer: Self-pay | Admitting: *Deleted

## 2017-06-21 ENCOUNTER — Telehealth: Payer: Self-pay

## 2017-06-21 DIAGNOSIS — I639 Cerebral infarction, unspecified: Secondary | ICD-10-CM

## 2017-06-21 DIAGNOSIS — M5116 Intervertebral disc disorders with radiculopathy, lumbar region: Secondary | ICD-10-CM

## 2017-06-21 DIAGNOSIS — R2689 Other abnormalities of gait and mobility: Secondary | ICD-10-CM

## 2017-06-21 NOTE — Telephone Encounter (Signed)
And an order for a wheechair

## 2017-06-21 NOTE — Patient Outreach (Signed)
[facility]) Care Management  06/21/2017  Todd [name] Sep 06, 1930 [id number]   Referral Source : [employer]. Chart Reviewed.  84 yo male recent admission to [facility] on 12/13, Dx:  CVA, discharged on 12/17 to [facility] SNF Discharged from [place] on 06/12/17 PMHx : Diabetes, CVA, Aphasia, Hypertension, CAD, Anxiety,Hyperlipedemia   Incoming call from patient daughter , Todd [name], she was able to provide HIPAA identifiers information.  [name] discussed that they are trying to get things arranged at home so that home visit can be scheduled. She voiced concern today , that [facility] home health came for evaluation on today,discussed they will be able to provide home health RN, PT, and bath aide services, but does not have a speech therapy service  that covers in that area.  [name] discussed that it is important that patient has that service and she is wants to change to a different home health agency that will be able to supply all services.  [name] also discussed need for a wheelchair to help transportation to appointment. She reports patient is currently doing okay at home using rolling walker that they have.   Placed call to [facility] [employer] , spoke with clinical assistant,  Phineas Real , she verifies that [facility] does not have a Speech therapist that covers in that area , and will not be able to provide the home health speech therapy services.  Daughter discussed patient continues to tolerate diet, taking medications as prescribed, not new symptoms of weakness noted.   Placed call to [place] office to relay concern regarding need for home health agency that is able to supply s;; needed  home services for speech therapy as well as physical therapy, bath aide and RN, also made aware of family request for wheelchair to help getting to car and into appointments. Provided my contact information.     Plan  Will plan return call to family to update as information  obtained, will continue to follow for weekly transition of care calls.    [name], RN, [place] Management Coordinator  [phone]- Mobile [phone]- Toll Free Main Office

## 2017-06-21 NOTE — Telephone Encounter (Signed)
Todd [name] from [facility] calling and states that pt would like a new home health agency within the [facility] insurance. Todd [name] does not offer speech therapy in his area. Please advise. CB#: [phone]. Will need new orders.

## 2017-06-21 NOTE — Telephone Encounter (Signed)
Copied from [place] [phone]. Topic: Inquiry >> Jun 20, 2017  4:25 PM Oliver Pila B wrote: Reason for CRM: [facility] called to get verbals speech therapy, physical therapy, and home health aid service

## 2017-06-21 NOTE — Telephone Encounter (Signed)
Called to give verbals, vm was number only. Left message requesting return call.

## 2017-06-22 ENCOUNTER — Telehealth: Payer: Self-pay | Admitting: Family Medicine

## 2017-06-22 NOTE — Telephone Encounter (Signed)
Copied from [place] #[id number]. Topic: Quick Communication - See Telephone Encounter >> Jun 22, 2017 10:37 AM Bea Graff, NT wrote: CRM for notification. See Telephone encounter for: Angie from [place] calling and states she needs additional information regarding the order for the pt to get a wheelchair and home health. CB#: [phone]  06/22/17.

## 2017-06-22 NOTE — Telephone Encounter (Signed)
Referral and RX for wheelchair was faxed to [place].

## 2017-06-22 NOTE — Addendum Note (Signed)
Addended by: Gerda Diss A on: 06/22/2017 09:05 AM   Modules accepted: Orders

## 2017-06-22 NOTE — Telephone Encounter (Signed)
Called [facility] and was informed that in network home health includes [place] [facility] [place].

## 2017-06-25 ENCOUNTER — Telehealth: Payer: Self-pay | Admitting: Family Medicine

## 2017-06-25 ENCOUNTER — Other Ambulatory Visit: Payer: Self-pay | Admitting: *Deleted

## 2017-06-25 NOTE — Telephone Encounter (Signed)
Attempted to reach to give verbal. VM only listed number. Verbal not left. Requested return call.

## 2017-06-25 NOTE — Telephone Encounter (Signed)
ok 

## 2017-06-25 NOTE — Telephone Encounter (Signed)
Copied from [place] [id number]. Topic: Quick Communication - See Telephone Encounter >> Jun 25, 2017 10:31 AM Ether Griffins B wrote: CRM for notification. See Telephone encounter for:  Todd [name] with [facility] wanting verbal orders for PT for 2x a week for 8 weeks. Call back number [phone] 06/25/17.

## 2017-06-25 NOTE — Patient Outreach (Signed)
[facility]) Care Management  06/25/2017  Todd [name] 03-Oct-1930 [id number]  Transition of care call  84 yo male recent admission to [facility] on 12/13, Dx:  CVA, discharged on 12/17 to [facility] SNF Discharged from [place] on 06/12/17 PMHx : Diabetes, CVA, Aphasia, Hypertension, CAD, Anxiety,Hyperlipedemia   Incoming call from Todd [name], daughter of patient ,emergency contact ,  HIPAA information confirmed.  Daughter discussed patient is doing okay at home, he lives with his son and daughter in [place].  Todd [name] inquiring when home health agency that can provide all home health services would contact her, so she could cancel with [facility] that is not able to provide speech therapy.  I explained to daughter that PCP office has been notified of her request and [facility] had been sent to [facility] for home health services and wheelchair per her request. Discussed with daughter that I could place call to [facility] to make sure they had her contact information she is in agreement.   Placed call to [facility] representative verified that they have received referral, I discussed family inquiring as to when they will notified, also verified that they have family contact information .   Discussed scheduling home visit as part of transition of care follow up and Todd [name] is in agreement.   Plan Will update daughter Todd [name] of phone call to [facility],  Will continue weekly transition of care follow up , outreach next week for home visit.    [name], RN, [place] Management Coordinator  [phone]- Mobile [phone]- Toll Free Main Office

## 2017-06-25 NOTE — Telephone Encounter (Signed)
Called and spoke to [place]. Clarification was given. She is faxing a wheelchair order form for completion. Will fill out and fax back once received.

## 2017-06-26 ENCOUNTER — Telehealth: Payer: Self-pay | Admitting: Family Medicine

## 2017-06-26 ENCOUNTER — Encounter: Payer: Self-pay | Admitting: *Deleted

## 2017-06-26 NOTE — Telephone Encounter (Signed)
Verbal given 

## 2017-06-26 NOTE — Telephone Encounter (Signed)
Copied from [place] [phone]. Topic: Inquiry >> Jun 26, 2017 12:28 PM Conception Chancy, NT wrote: Colletta [place] is calling from [facility] is calling and requesting a verbal order for occupational therapy 2x a week for 8 weeks.  Colletta [place] [phone]

## 2017-06-26 NOTE — Telephone Encounter (Addendum)
Angie with [facility] calling Jada back 1. To inquire if you received paperwork for wheelchair and home health order  2. If it had been decided they who do the PT,OT, and ST. Daughter advised [facility] has already been in the home. Please call and clarify.  Angie: [phone]  (Also, pt's appt tomorrow should be a face to face for wheelchair? I did change pt's appt to a 30 min)

## 2017-06-26 NOTE — Telephone Encounter (Signed)
Message relayed.

## 2017-06-26 NOTE — Telephone Encounter (Signed)
Message relayed to patient. Verbalized understanding and denied questions.   

## 2017-06-27 ENCOUNTER — Ambulatory Visit (INDEPENDENT_AMBULATORY_CARE_PROVIDER_SITE_OTHER): Payer: PPO | Admitting: Family Medicine

## 2017-06-27 ENCOUNTER — Encounter: Payer: Self-pay | Admitting: Family Medicine

## 2017-06-27 VITALS — BP 151/67 | HR 67 | Temp 97.5°F | Wt 171.0 lb

## 2017-06-27 DIAGNOSIS — I10 Essential (primary) hypertension: Secondary | ICD-10-CM | POA: Diagnosis not present

## 2017-06-27 DIAGNOSIS — I639 Cerebral infarction, unspecified: Secondary | ICD-10-CM | POA: Diagnosis not present

## 2017-06-27 DIAGNOSIS — G250 Essential tremor: Secondary | ICD-10-CM

## 2017-06-27 DIAGNOSIS — E119 Type 2 diabetes mellitus without complications: Secondary | ICD-10-CM

## 2017-06-27 NOTE — Assessment & Plan Note (Signed)
The current medical regimen is effective;  continue present plan and medications.  

## 2017-06-27 NOTE — Assessment & Plan Note (Signed)
The current medical regimen is effective;  continue present plan and medications. Needs wheelchair

## 2017-06-27 NOTE — Progress Notes (Signed)
BP (!) 151/67 (BP Location: Left Arm, Patient Position: Sitting, Cuff Size: Normal)   Pulse 67   Temp (!) 97.5 F (36.4 C) (Oral)   Wt 171 lb (77.6 kg)   SpO2 95%   BMI 27.60 kg/m    Subjective:    Patient ID: Todd [name], male    DOB: 1930-09-22, 84 y.o.   MRN: [id number]  HPI: Todd [name] is a 84 y.o. male  Chief Complaint  Patient presents with  . Wheel Chair    [facility] needs detailed description of the type of wheel chair patient needs.  Face-to-face visit with patient regarding mobility concerns and use of wheelchair.  Patient unable to propel a wheelchair by himself due to tremor and residual stroke symptoms arm weakness.  Patient unable to use a four-point walker in the house because of again ongoing weakness from his stroke.  Using a walker last night patient fell in the bathroom because it tipped over as he grabbed it. Patient accompanied by daughters who assist with history. Relevant past medical, surgical, family and social history reviewed and updated as indicated. Interim medical history since our last visit reviewed. Allergies and medications reviewed and updated.  Review of Systems  Constitutional: Positive for fatigue.  Respiratory: Negative.   Cardiovascular: Negative.     Per HPI unless specifically indicated above     Objective:    BP (!) 151/67 (BP Location: Left Arm, Patient Position: Sitting, Cuff Size: Normal)   Pulse 67   Temp (!) 97.5 F (36.4 C) (Oral)   Wt 171 lb (77.6 kg)   SpO2 95%   BMI 27.60 kg/m   Wt Readings from Last 3 Encounters:  06/27/17 171 lb (77.6 kg)  06/13/17 171 lb (77.6 kg)  05/18/17 173 lb (78.5 kg)    Physical Exam  Constitutional: He appears well-developed and well-nourished.  HENT:  Chronic difficulty with speech  Cardiovascular: Normal rate and regular rhythm.  Musculoskeletal:  Right-sided weakness with tremor of both hands.  Shuffling gait and swelling of his feet especially his right foot.  Psychiatric: His  behavior is normal. Judgment and thought content normal.    Results for orders placed or performed during the hospital encounter of 44/31/54  Basic metabolic panel  Result Value Ref Range   Sodium 138 135 - 145 mmol/L   Potassium 3.7 3.5 - 5.1 mmol/L   Chloride 104 101 - 111 mmol/L   CO2 26 22 - 32 mmol/L   Glucose, Bld 180 (H) 65 - 99 mg/dL   BUN 24 (H) 6 - 20 mg/dL   Creatinine, Ser 1.19 0.61 - 1.24 mg/dL   Calcium 9.2 8.9 - 10.3 mg/dL   GFR calc non Af Amer 54 (L) >60 mL/min   GFR calc Af Amer >60 >60 mL/min   Anion gap 8 5 - 15  CBC  Result Value Ref Range   WBC 4.9 3.8 - 10.6 K/uL   RBC 3.57 (L) 4.40 - 5.90 MIL/uL   Hemoglobin 11.0 (L) 13.0 - 18.0 g/dL   HCT 33.7 (L) 40.0 - 52.0 %   MCV 94.3 80.0 - 100.0 fL   MCH 30.9 26.0 - 34.0 pg   MCHC 32.7 32.0 - 36.0 g/dL   RDW 13.4 11.5 - 14.5 %   Platelets 231 150 - 440 K/uL  Urinalysis, Complete w Microscopic  Result Value Ref Range   Color, Urine YELLOW (A) YELLOW   APPearance CLEAR (A) CLEAR   Specific Gravity, Urine 1.015 1.005 - 1.030  pH 5.0 5.0 - 8.0   Glucose, UA NEGATIVE NEGATIVE mg/dL   Hgb urine dipstick NEGATIVE NEGATIVE   Bilirubin Urine NEGATIVE NEGATIVE   Ketones, ur NEGATIVE NEGATIVE mg/dL   Protein, ur NEGATIVE NEGATIVE mg/dL   Nitrite NEGATIVE NEGATIVE   Leukocytes, UA NEGATIVE NEGATIVE   RBC / HPF 0-5 0 - 5 RBC/hpf   WBC, UA 0-5 0 - 5 WBC/hpf   Bacteria, UA NONE SEEN NONE SEEN   Squamous Epithelial / LPF 0-5 (A) NONE SEEN   Mucus PRESENT   Troponin I  Result Value Ref Range   Troponin I 0.03 (HH) <0.03 ng/mL  Troponin I  Result Value Ref Range   Troponin I 0.03 (HH) <0.03 ng/mL  Glucose, capillary  Result Value Ref Range   Glucose-Capillary 124 (H) 65 - 99 mg/dL  Hemoglobin A1c  Result Value Ref Range   Hgb A1c MFr Bld 6.3 (H) 4.8 - 5.6 %   Mean Plasma Glucose 134 mg/dL  Lipid panel  Result Value Ref Range   Cholesterol 122 0 - 200 mg/dL   Triglycerides 97 <150 mg/dL   HDL 36 (L) >40  mg/dL   Total CHOL/HDL Ratio 3.4 RATIO   VLDL 19 0 - 40 mg/dL   LDL Cholesterol 67 0 - 99 mg/dL  CBC  Result Value Ref Range   WBC 4.8 3.8 - 10.6 K/uL   RBC 3.25 (L) 4.40 - 5.90 MIL/uL   Hemoglobin 10.2 (L) 13.0 - 18.0 g/dL   HCT 30.6 (L) 40.0 - 52.0 %   MCV 94.2 80.0 - 100.0 fL   MCH 31.3 26.0 - 34.0 pg   MCHC 33.3 32.0 - 36.0 g/dL   RDW 13.4 11.5 - 14.5 %   Platelets 198 150 - 440 K/uL  Creatinine, serum  Result Value Ref Range   Creatinine, Ser 1.00 0.61 - 1.24 mg/dL   GFR calc non Af Amer >60 >60 mL/min   GFR calc Af Amer >60 >60 mL/min  Glucose, capillary  Result Value Ref Range   Glucose-Capillary 122 (H) 65 - 99 mg/dL  Glucose, capillary  Result Value Ref Range   Glucose-Capillary 152 (H) 65 - 99 mg/dL  Glucose, capillary  Result Value Ref Range   Glucose-Capillary 156 (H) 65 - 99 mg/dL  Glucose, capillary  Result Value Ref Range   Glucose-Capillary 118 (H) 65 - 99 mg/dL  Glucose, capillary  Result Value Ref Range   Glucose-Capillary 126 (H) 65 - 99 mg/dL  Glucose, capillary  Result Value Ref Range   Glucose-Capillary 169 (H) 65 - 99 mg/dL  Glucose, capillary  Result Value Ref Range   Glucose-Capillary 156 (H) 65 - 99 mg/dL  Glucose, capillary  Result Value Ref Range   Glucose-Capillary 137 (H) 65 - 99 mg/dL  Glucose, capillary  Result Value Ref Range   Glucose-Capillary 123 (H) 65 - 99 mg/dL  Glucose, capillary  Result Value Ref Range   Glucose-Capillary 153 (H) 65 - 99 mg/dL  Glucose, capillary  Result Value Ref Range   Glucose-Capillary 161 (H) 65 - 99 mg/dL  Glucose, capillary  Result Value Ref Range   Glucose-Capillary 172 (H) 65 - 99 mg/dL  Glucose, capillary  Result Value Ref Range   Glucose-Capillary 131 (H) 65 - 99 mg/dL  Glucose, capillary  Result Value Ref Range   Glucose-Capillary 159 (H) 65 - 99 mg/dL  ECHOCARDIOGRAM COMPLETE  Result Value Ref Range   Weight 2,768 oz   Height 66 in   BP 135/73  mmHg      Assessment & Plan:    Problem List Items Addressed This Visit      Cardiovascular and Mediastinum   Hypertension - Primary   Acute CVA (cerebrovascular accident) [place])    The current medical regimen is effective;  continue present plan and medications. Needs wheelchair        Endocrine   Diabetes mellitus without complication (HCC)     Nervous and Auditory   Benign essential tremor    The current medical regimen is effective;  continue present plan and medications.           Follow up plan: Return for As scheduled.

## 2017-06-28 ENCOUNTER — Telehealth: Payer: Self-pay | Admitting: Family Medicine

## 2017-06-28 ENCOUNTER — Ambulatory Visit: Payer: Self-pay

## 2017-06-28 NOTE — Telephone Encounter (Signed)
Copied from [place] [phone]. Topic: Inquiry >> Jun 28, 2017  8:40 AM Scherrie Gerlach wrote: Reason for CRM: angie with [facility] calling to follow up on the wheelchair face to face, order, and updated home health orders. Angie did not receive, and their fax was down yesterday afternoon. Please resend all info back again please  Direct number to Angie  [phone]

## 2017-06-28 NOTE — Telephone Encounter (Signed)
Pt's family member stating [facility] has only received the fax for the wheelchair but nothing for the physical therapy, speech therapy and cna orders.

## 2017-06-28 NOTE — Telephone Encounter (Signed)
Detailed order and face to face faxed back to [facility]

## 2017-07-02 ENCOUNTER — Telehealth: Payer: Self-pay | Admitting: Family Medicine

## 2017-07-02 ENCOUNTER — Ambulatory Visit (INDEPENDENT_AMBULATORY_CARE_PROVIDER_SITE_OTHER): Payer: PPO

## 2017-07-02 DIAGNOSIS — H401131 Primary open-angle glaucoma, bilateral, mild stage: Secondary | ICD-10-CM | POA: Diagnosis not present

## 2017-07-02 DIAGNOSIS — Z23 Encounter for immunization: Secondary | ICD-10-CM

## 2017-07-02 NOTE — Telephone Encounter (Signed)
Order for home health refaxed to [facility].

## 2017-07-02 NOTE — Telephone Encounter (Signed)
[facility] at [phone] and d/c orders for home health w/ that company. Called and made daughter Ardeen Fillers aware.

## 2017-07-02 NOTE — Telephone Encounter (Signed)
Patient needs Dr Jeananne Rama to sign off on [facility] home health in order to start with [facility]  Thanks

## 2017-07-04 ENCOUNTER — Telehealth: Payer: Self-pay | Admitting: Family Medicine

## 2017-07-04 ENCOUNTER — Other Ambulatory Visit: Payer: Self-pay | Admitting: *Deleted

## 2017-07-04 ENCOUNTER — Encounter: Payer: Self-pay | Admitting: *Deleted

## 2017-07-04 DIAGNOSIS — I639 Cerebral infarction, unspecified: Secondary | ICD-10-CM

## 2017-07-04 DIAGNOSIS — I10 Essential (primary) hypertension: Secondary | ICD-10-CM

## 2017-07-04 NOTE — Patient Outreach (Signed)
[facility] ([facility]) Care Management   07/05/2017  Todd [name] 04/20/1931 [id number]  Todd [name] is an 84 y.o. male   84 yo male recent admission to [facility] on 12/13, Dx: CVA, discharged on 12/17 to [facility] Discharged from [facility] on 06/12/17 PMHx : Diabetes, CVA, Aphasia, Hypertension, CAD, Anxiety,Hyperlipedemia   Subjective:  Patient reports good when asked how he was doing, then pointed to back area where he has chronic pain.    Objective:  BP (!) 150/80 (BP Location: Left Arm, Patient Position: Sitting, Cuff Size: Normal)   Pulse 75   Resp 18   Ht 1.727 m (5' 8")   Wt 171 lb (77.6 kg)   SpO2 97%   BMI 26.00 kg/m  Easily frustrated and tearful when unable to answer questions promptly due to difficulty with expressing self due to speech. Ambulated patient in home, using rolling walker .   Review of Systems  Constitutional: Negative.   HENT: Negative.   Eyes: Negative.   Respiratory: Negative.   Cardiovascular: Positive for leg swelling.       Slight swelling lower legs   Gastrointestinal: Negative.   Genitourinary: Negative.   Musculoskeletal: Positive for back pain.  Skin: Negative.   Neurological: Positive for speech change. Negative for dizziness and headaches.       Aphasia   Endo/Heme/Allergies: Negative.   Psychiatric/Behavioral: The patient is nervous/anxious.     Physical Exam  Constitutional: He is oriented to person, place, and time. He appears well-developed and well-nourished.  Cardiovascular: Normal rate.  Respiratory: Effort normal.  GI: Soft.  Neurological: He is alert and oriented to person, place, and time.  Skin: Skin is warm and dry.     Psychiatric: He has a normal mood and affect. His behavior is normal. Judgment and thought content normal.    Encounter Medications:   Outpatient Encounter Medications as of 07/04/2017  Medication Sig Note  . aspirin EC 81 MG tablet Take 81 mg by mouth daily.   . atorvastatin (LIPITOR) 10  MG tablet Take 1 tablet (10 mg total) by mouth daily.   . busPIRone (BUSPAR) 10 MG tablet Take 10 mg by mouth at bedtime.    . clonazePAM (KLONOPIN) 0.5 MG tablet Take 1 tablet (0.5 mg total) by mouth daily as needed for anxiety.   . DULoxetine (CYMBALTA) 20 MG capsule Take 1 capsule (20 mg total) by mouth daily. (Patient taking differently: Take 20 mg by mouth at bedtime. )   . gabapentin (NEURONTIN) 100 MG capsule Take 100 mg by mouth 2 (two) times daily.    . hydrochlorothiazide (HYDRODIURIL) 25 MG tablet Take 1 tablet (25 mg total) by mouth daily.   . latanoprost (XALATAN) 0.005 % ophthalmic solution Place 1 drop into both eyes at bedtime.    . metFORMIN (GLUCOPHAGE) 500 MG tablet Take 1 tablet (500 mg total) by mouth daily with breakfast.   . pantoprazole (PROTONIX) 40 MG tablet Take 1 tablet (40 mg total) by mouth daily.   . propranolol ER (INDERAL LA) 80 MG 24 hr capsule Take 1 capsule (80 mg total) by mouth daily.   . telmisartan (MICARDIS) 80 MG tablet Take 1 tablet (80 mg total) by mouth daily.   . Blood Glucose Monitoring Suppl (ACCU-CHEK AVIVA PLUS) w/Device KIT Use to check sugar levels x 2 daily (Patient not taking: Reported on 07/04/2017)   . clopidogrel (PLAVIX) 75 MG tablet Take 1 tablet (75 mg total) by mouth daily. (Patient not taking: Reported on 07/04/2017)   07/04/2017: Need refill   . Lancets (ACCU-CHEK SOFT TOUCH) lancets Use as instructed (Patient not taking: Reported on 07/04/2017)    No facility-administered encounter medications on file as of 07/04/2017.     Functional Status:   In your present state of health, do you have any difficulty performing the following activities: 06/25/2017 05/18/2017  Hearing? Y N  Vision? N N  Difficulty concentrating or making decisions? Y N  Comment difficult expressing due to speech -  Walking or climbing stairs? Y Y  Comment using walker -  Dressing or bathing? Y N  Comment - only trouble shaving do to hands shaking"  Doing errands,  shopping? Y Y  Comment family assist  -  Preparing Food and eating ? Y -  Comment family prepares meals  -  Using the Toilet? N -  In the past six months, have you accidently leaked urine? Y -  Do you have problems with loss of bowel control? N -  Managing your Medications? Y -  Comment famly prepares medications  -  Managing your Finances? Y -  Comment family available to assist  -  Housekeeping or managing your Housekeeping? Y -  Comment Family provides housekeeping chores  -  Some recent data might be hidden    Fall/Depression Screening:    Fall Risk  06/25/2017 06/13/2017 05/07/2017  Falls in the past year? Yes No No  Number falls in past yr: 1 - -  Injury with Fall? No - -  Risk for fall due to : History of fall(s);Impaired balance/gait - -  Follow up Falls prevention discussed - -   PHQ 2/9 Scores 07/04/2017 06/13/2017 05/07/2017 03/26/2017 10/25/2016 09/27/2016 10/11/2015  PHQ - 2 Score 1 0 0 0 1 2 2  PHQ- 9 Score - - - - 6 9 11    Assessment:  Co visit with Todd [name] , LCSW for  Initial transition of care home visit, patients' daughters , Todd [name] and Todd [name] ,  daughter in law Todd [name] , present during visit, also daughter Todd [name] present on speaker phone on and off during visit.   Recent Stroke  Continues with speech difficulties, able to answer some one word questions without problems, able to state date of birth.,  making progress with ambulation, patient standing at least hourly from while sitting in chair during the day, and tolerating walking at least twice daily in pathway in home. Fall Risk  High fall risk , Home safety evaluation completed, patient has clear pathway for walking,using rolling walker, has 24 hour support, he has need for grab bars in bathroom, [facility] social worker working on resources to address this  need. [facility]  [facility], but is looking into less cost options due to copayment, they are checking into local  hospice thr[facility] shop.  Patient has 24 hours support at home.  Home health needs Home health services with [facility] has been discontinued per family request as that agency does not have speech therapist  in patient area as  this is family priority as they view this as greatest problem since stroke.  Family wants to pursue [facility] with understanding they do not have skilled nursing available staff available,  but will be able to provide physical therapy, occupation, speech therapy as well as bath aide.  Need assistance with coordinating getting orders from PCP office to [facility].  Hypertension  Family member reports not adding   salt to foods and limiting processed foods, Will provide education on  blood pressure monitoring in home, will benefit from continued education on blood pressure management.   Medications  Family uses pill organizer to manage patient daily medications, during medication review noted patient did not have, prescription for Plavix..  Family found   5 doses  in  pill pack received at discharge from SNF facility. Placed call to PCP office to notify that patient does not have prescription for Plavix, family  does not recall adding Plavix  to organizer since being home,  family will add Plavix they have on hand to pill organizer, reinforced Plavix indications.  Patient was recently discharged from hospital and all medications have been reviewed. Advanced Directive Patient does not have advanced directive, family provided packet for review during visit.    Plan:  1.Welcome provided and reviewed, care planning at visit.  2. Provided EMMI handout of stroke preventing 2nd stroke.  3.Provided and reviewed EMMI on falls in elderly patient, reinforced continued exercises recommended by therapy.  4.Care coordination call to Angie Trotter to clarify orders needed for home services, also notified Dr.Crissmon office spoke with Karen, to discuss  needs, and provided contact number for Angie at [facility] and my contact information .  5.Provided blood pressure monitor from [facility] resources , will provide education on use.Provided EMMI handout of hypertension, and hi low salt handout for review.  6..Dr.Crissmon office notified of need of prescription for Plavix.  Will send this initial note to PCP office.  Will  call to patient/family later in week to follow up on home health services.    [facility] CM Care Plan Problem One     Most Recent Value  Care Plan Problem One  Recent hospital admission, rehab stay related to new stroke   Role Documenting the Problem One  Care Management Coordinator  Care Plan for Problem One  Active  [facility] Long Term Goal   Patient will not experience hospital admission in the next 31 days  [facility] Long Term Goal Start Date  06/15/17  Interventions for Problem One Long Term Goal  Home visit, current clinical state reviewed, reinforced adhering to current medication plan,taking all medication as prescribed.   [facility] CM Short Term Goal #1   Patient/family will report obtaining wheelchair for use in the next 30 days  [updated goal]  [facility] CM Short Term Goal #1 Start Date  06/15/17  [facility] CM Short Term Goal #2   Patient/family will report being able to monitor patient blood pressure readings at home in the next 30 days   [facility] CM Short Term Goal #2 Start Date  06/15/17  Interventions for Short Term Goal #2  Provided and educated on use of blood pressure monitor, with teachback , review of emmi handout on hypertension   [facility] CM Short Term Goal #3  Family will report increase in knowledge related to stroke symptoms in the next 30 days   [facility] CM Short Term Goal #3 Start Date  07/04/17  Interventions for Short Tern Goal #3  Provided and reviewed EMMI on stroke symptoms and action plan   [facility] CM Short Term Goal #4  Patient will report improvement in speech communication in the next 30 days   [facility] CM Short Term Goal #4 Start Date  07/04/17  Interventions  for Short Term Goal #4  Care coordination call to [facility] and PCP office regarding services       , RN, PCCN [facility] Care Management,Care Management   Coordinator  [phone]- Mobile [phone]- [place]

## 2017-07-04 NOTE — Telephone Encounter (Signed)
Copied from [place] [facility] [place]. Topic: Quick Communication - Rx Refill/Question >> Jul 04, 2017  4:35 PM Wynetta Emery, [place] C wrote: Medication: Clopidogrel   Has the patient contacted their pharmacy? no   (Agent: If no, request that the patient contact the pharmacy for the refill.)  Preferred Pharmacy (with phone number or street name): [place], [place]   Agent: Please be advised that RX refills may take up to 3 business days. We ask that you follow-up with your pharmacy.

## 2017-07-04 NOTE — Patient Outreach (Signed)
[facility]) Care Management  [facility] Social Work  07/04/2017  Mattix Imhof 01-09-1931 [id number]  Subjective:  Patient is a 84 year old male, recent discharge from [facility] following a hospital stay following a CVA. Patient resides in his own home, with his son and daughter in law. He also has additional family members assisting with his daily care. Daughter in [place], [phone] main caregiver. Patient's 2 daughters and daughter in law present during the visit. .Patient has Aphasia, increased difficulty verbally expressing himself, tearful when making attempts to put his words together. RNCM Landis Martins actively worked to ensure  that patient's [facility] agency be  switched to [place] to allow for PT, OT and Speech services to be provided. Per patient's daughters, patient needs a grab bar installed.  [employer] to be contacted for assistance with this intallation   Objective:   Encounter Medications:  Outpatient Encounter Medications as of 07/04/2017  Medication Sig  . aspirin EC 81 MG tablet Take 81 mg by mouth daily.  [name] Kitchen atorvastatin (LIPITOR) 10 MG tablet Take 1 tablet (10 mg total) by mouth daily.  . Blood Glucose Monitoring Suppl (ACCU-CHEK AVIVA PLUS) w/Device KIT Use to check sugar levels x 2 daily (Patient not taking: Reported on 07/04/2017)  . busPIRone (BUSPAR) 10 MG tablet Take 10 mg by mouth at bedtime.   . clonazePAM (KLONOPIN) 0.5 MG tablet Take 1 tablet (0.5 mg total) by mouth daily as needed for anxiety.  . clopidogrel (PLAVIX) 75 MG tablet Take 1 tablet (75 mg total) by mouth daily.  . DULoxetine (CYMBALTA) 20 MG capsule Take 1 capsule (20 mg total) by mouth daily. (Patient taking differently: Take 20 mg by mouth at bedtime. )  . gabapentin (NEURONTIN) 100 MG capsule Take 100 mg by mouth 2 (two) times daily.   . hydrochlorothiazide (HYDRODIURIL) 25 MG tablet Take 1 tablet (25 mg total) by mouth daily.  . Lancets (ACCU-CHEK SOFT TOUCH)  lancets Use as instructed (Patient not taking: Reported on 07/04/2017)  . latanoprost (XALATAN) 0.005 % ophthalmic solution Place 1 drop into both eyes at bedtime.   . metFORMIN (GLUCOPHAGE) 500 MG tablet Take 1 tablet (500 mg total) by mouth daily with breakfast.  . pantoprazole (PROTONIX) 40 MG tablet Take 1 tablet (40 mg total) by mouth daily.  . propranolol ER (INDERAL LA) 80 MG 24 hr capsule Take 1 capsule (80 mg total) by mouth daily.  [name] Kitchen telmisartan (MICARDIS) 80 MG tablet Take 1 tablet (80 mg total) by mouth daily.   No facility-administered encounter medications on file as of 07/04/2017.     Functional Status:  In your present state of health, do you have any difficulty performing the following activities: 06/25/2017 05/18/2017  Hearing? Y N  Vision? N N  Difficulty concentrating or making decisions? Y N  Comment difficult expressing due to speech -  Walking or climbing stairs? Y Y  Comment using walker -  Dressing or bathing? Y N  Comment - only trouble shaving do to hands shaking"  Doing errands, shopping? Tempie Donning  Comment family assist  -  Preparing Food and eating ? Y -  Comment family prepares meals  -  Using the Toilet? N -  In the past six months, have you accidently leaked urine? Y -  Do you have problems with loss of bowel control? N -  Managing your Medications? Y -  Comment famly prepares medications  -  Managing your Finances? Y -  Comment family available to assist  -  Housekeeping or managing your Housekeeping? Y -  Comment Family provides housekeeping chores  -  Some recent data might be hidden    Fall/Depression Screening:  PHQ 2/9 Scores 07/04/2017 06/13/2017 05/07/2017 03/26/2017 10/25/2016 09/27/2016 10/11/2015  PHQ - 2 Score 1 0 0 0 1 2 2   PHQ- 9 Score - - - - 6 9 11     Assessment:  Patient continues with speech difficulties, tearful when trying to get his words out other that yes or no.   Patient needs grab bar in bathroom, family referred to [employer] for assistance with this.  HH discontinued with [facility] due to the fact that they do not have availability for a Speech therapist.  Patient now to receive [facility] through [facility] where they will be able to provide PT, OT, bath aid and Speech. Patient's family emphasized the need for Speech therapy. Plan: This Education officer, museum to follow up with referral for grab bars for patient's bathroom.   [name] [facility] Care Management [phone]

## 2017-07-04 NOTE — Telephone Encounter (Signed)
[facility] needs a new order faxed stating patient needs the following care PT/OT, Speech, and bath aid but no skilled nursing.  If any questions please call Oran Rein [phone] Ext [phone] or Landis Martins with [place]  Thanks

## 2017-07-05 ENCOUNTER — Encounter: Payer: Self-pay | Admitting: *Deleted

## 2017-07-05 ENCOUNTER — Other Ambulatory Visit: Payer: Self-pay | Admitting: *Deleted

## 2017-07-05 NOTE — Telephone Encounter (Signed)
This encounter was created in error - please disregard.

## 2017-07-05 NOTE — Patient Outreach (Signed)
[facility]) Care Management  07/05/2017  Todd [name] [birth year] [id number]   Incoming call from patient daughter , Todd [name] she discussed that she has received call from [facility] Rein [facility] ,stating that she has received needed orders from PCP office for home health care services, and will begin process to begin services.  Daughter also discussed that she has not received message from [place] regarding refill on Plavix. Reassured her that PCP  office has received message and awaiting MD to verify and send prescription to pharmacy.  Discussed with Todd [name] that patient has 5 doses of Plavix at home, and reinforced that it is  added to patient pill organizer, until new prescription sent in, she verbalized understanding.   Daughter also discussed patient having difficulty with getting  leg over tub to sit on current shower bench that they have,  she discussed availability of different type shower bench they has legs and sit on outside of tub to make it easier for patient to get in .Daughter also discussed cost concerns related to getting wheelchair.  Will discuss with Todd [name], [facility] LCSW Plan Will call patient daughter in the next day or sooner  to follow up regarding prescription and if needed will place return call to PCP office.    [name], RN, [place] Management Coordinator  [phone]- Mobile [phone]- Toll Free Main Office

## 2017-07-05 NOTE — Telephone Encounter (Signed)
Referral re done and faxed.

## 2017-07-06 ENCOUNTER — Other Ambulatory Visit: Payer: Self-pay | Admitting: *Deleted

## 2017-07-06 NOTE — Patient Outreach (Signed)
[facility] [facility]) Care Management  07/06/2017  Nijah Orlich 11-Nov-1930 [id number]   Care Coordination call  Placed follow up call to [place] office spoke with representative  to discuss patient does not have prescription for Plavix, noted at initial home visit on 1/29, Patient was started on medication at recent hospital  discharge after CVA and continued at [facility].  RNCM noted patient had remaining blister package medication from skilled facility with 5 remaining Plavix. Patient family aware and medication added to daily pill organizer for patient to take daily he has enough to take until 2/ Discussed with office staff patient family eager to have prescription called to Pharmacy, to be available by Monday.  Placed call to Cain Saupe to update her return call to PCP office regarding Plavix prescription, informed PCP will be back in office to address at that time. Reinforced to continue to use medication that she has on hand.  Placed call to caregiver [name], HIPAA information verified, she confirms patient has Plavix in pill organizer and has enough to last for next 3 days.   Plan Will plan follow up call in the next week.   [name], RN, [place] Management Coordinator  [phone]- Mobile [phone]- Toll Free Main Office

## 2017-07-07 DIAGNOSIS — G250 Essential tremor: Secondary | ICD-10-CM | POA: Diagnosis not present

## 2017-07-07 DIAGNOSIS — I10 Essential (primary) hypertension: Secondary | ICD-10-CM | POA: Diagnosis not present

## 2017-07-07 DIAGNOSIS — E119 Type 2 diabetes mellitus without complications: Secondary | ICD-10-CM | POA: Diagnosis not present

## 2017-07-07 DIAGNOSIS — Z7902 Long term (current) use of antithrombotics/antiplatelets: Secondary | ICD-10-CM | POA: Diagnosis not present

## 2017-07-07 DIAGNOSIS — I69320 Aphasia following cerebral infarction: Secondary | ICD-10-CM | POA: Diagnosis not present

## 2017-07-07 DIAGNOSIS — I69353 Hemiplegia and hemiparesis following cerebral infarction affecting right non-dominant side: Secondary | ICD-10-CM | POA: Diagnosis not present

## 2017-07-07 DIAGNOSIS — Z7982 Long term (current) use of aspirin: Secondary | ICD-10-CM | POA: Diagnosis not present

## 2017-07-07 DIAGNOSIS — Z7984 Long term (current) use of oral hypoglycemic drugs: Secondary | ICD-10-CM | POA: Diagnosis not present

## 2017-07-09 ENCOUNTER — Telehealth: Payer: Self-pay | Admitting: Family Medicine

## 2017-07-09 ENCOUNTER — Other Ambulatory Visit: Payer: Self-pay | Admitting: *Deleted

## 2017-07-09 MED ORDER — CLOPIDOGREL BISULFATE 75 MG PO TABS: 75.0000 mg | ORAL_TABLET | Freq: Every day | ORAL | Status: DC

## 2017-07-09 NOTE — Telephone Encounter (Signed)
Tc from patient's daughter, Ardeen Fillers requesting a refill on Plavix 75 MG daily. 06/27/17 OV reads no changes with current CVA therapy. On 07/04/17, it was noted he wasn't taking this medication on meds and orders list by Doreene Adas, Outreach RN. Needs refill was stated. Refill completed.

## 2017-07-09 NOTE — Telephone Encounter (Signed)
Copied from [place] [phone]. Topic: Quick Communication - See Telephone Encounter >> Jul 09, 2017  4:10 PM Boyd Kerbs wrote: CRM for notification. See Telephone encounter for:   Rowe Clack, [facility] [phone]  verbal orders   OT 2 x 3 wks, improve personal care  07/09/17.

## 2017-07-09 NOTE — Telephone Encounter (Signed)
Copied from [place]. Topic: Quick Communication - See Telephone Encounter >> Jul 09, 2017 11:58 AM Bea Graff, NT wrote: CRM for notification. See Telephone encounter for: Todd [name] PT with [facility], calling to get verbal auth for 2 times a week for 4 weeks and add a nurse aid for this pt for bathing. CB#: [phone]  07/09/17.

## 2017-07-09 NOTE — Telephone Encounter (Signed)
Verbal order given  

## 2017-07-09 NOTE — Patient Outreach (Signed)
[facility] [facility]) Care Management  07/09/2017  Micael Barb [birth year] [id number]   Care Coordination call  Placed call to [place] office to follow up on prescription need for Plavix that was prescribed after recent  discharge from hospital, then to rehab at [facility] and return to home on 1/8. Patient does not have active prescription on file at [place] [facility], explained this to office representative.   Placed follow up call to patient daughter Ralphie Lovelady, no answer able  to leave a message Placed call to [name] , hipaa information verified. She reports patient is doing pretty good, they were able to get a wheelchair from the [facility] home and they are taking patient out shopping for bedroom slippers.  Discussed with caregiver that I have contacted PCP office again for  prescription for plavix, she discussed patient has one dose left for Tuesday.   Plan Will place follow up transition of care call within this week.[name], RN, [place] Management Coordinator  [phone]- Mobile [phone]- Toll Free Main Office

## 2017-07-10 ENCOUNTER — Other Ambulatory Visit: Payer: Self-pay

## 2017-07-10 ENCOUNTER — Other Ambulatory Visit: Payer: Self-pay | Admitting: *Deleted

## 2017-07-10 ENCOUNTER — Telehealth: Payer: Self-pay | Admitting: Family Medicine

## 2017-07-10 MED ORDER — CLOPIDOGREL BISULFATE 75 MG PO TABS: 75.0000 mg | ORAL_TABLET | Freq: Every day | ORAL | 3 refills | Status: DC

## 2017-07-10 NOTE — Telephone Encounter (Signed)
Copied from [place] [id number]. Topic: General - Other >> Jul 09, 2017  3:21 PM Carolyn Stare wrote:  Todd [name] with [facility]  has been trying to get this medicine for pt since last week,    pt was put on this med while in hospital and need to continue to take   clopidogrel (PLAVIX) 75 MG tablet  [facility] in [place]

## 2017-07-10 NOTE — Telephone Encounter (Signed)
Verbal given 

## 2017-07-10 NOTE — Patient Outreach (Signed)
[place] [facility]) Care Management  07/10/2017  Todd [name] 1930/10/24 [id number]   Care Coordination call  Placed call to [place] office to discuss patient need for prescription for plavix, able to speak with Magda Paganini, Triage nurse that verifies prescription sent to [facility] on 2/15 .   Plan   will plan follow up  Transition of care call to patient/family on this week.    [name], RN, [place] Management Coordinator  [phone]- Mobile [phone]- Toll Free Main Office

## 2017-07-10 NOTE — Telephone Encounter (Signed)
[facility] with [facility] called to be sure pt. Had a prescription for his Plavix. This was confirmed.

## 2017-07-10 NOTE — Telephone Encounter (Signed)
Copied from [place] [phone]. Topic: Quick Communication - See Telephone Encounter >> Jul 09, 2017  4:04 PM Oneta Rack wrote: CRM for notification. See Telephone encounter for:   07/09/17.  Caller name: Amy  Relation to pt: Speech Therapist from [facility]  Call back number: [phone]   Reason for call:  Requesting verbal orders for home health speech therapy 3x 2 2x 3, please advise >> Jul 09, 2017  4:05 PM Oneta Rack wrote: CRM for notification. See Telephone encounter for:   07/09/17.  Caller name: Amy  Relation to pt: Speech Therapist from [facility]  Call back number: [phone]   Reason for call:  Requesting verbal orders for home health speech therapy 3x 2 2x 3, please advise

## 2017-07-12 ENCOUNTER — Other Ambulatory Visit: Payer: Self-pay | Admitting: *Deleted

## 2017-07-12 NOTE — Telephone Encounter (Signed)
Todd [name] from [facility] called and is awaiting verbals for Occupational Therapy, contact Todd [name] to give orders

## 2017-07-12 NOTE — Patient Outreach (Addendum)
[place] [facility]) Care Management  07/12/2017  Todd [name] 1930/11/07 [id number]   Transition of care call    Successful transition of care call placed to patient daughter, Todd [name], HIPAA confirmed.  Daughter reports patient is doing fairly well, he continues to tolerate ambulation in home using rolling walker.   Per daughter she confirms patient continues to take medications as prescribed, confirms he received new prescription for Plavix on this week and it has been added to daily pill organizer.   Daughter discussed being pleased having [facility] with speech therapy , physical and occupational therapy, they are awaiting bath aide services to begin. Patient son and daughter assist him with personal care at present.  Daughter discussed they plan follow up podiatry for getting new diabetic shoes.   Discussed with daughter if daughter in law , has been monitoring blood pressure at home she is unsure, but will follow up with her.   Plan  Will plan follow call, in the next week for transition of care and assess for next home visit.   [name], RN, [place] Management Coordinator  [phone]- Mobile [phone]- Toll Free Main Office

## 2017-07-12 NOTE — Patient Outreach (Signed)
[place]) Care Management  07/12/2017  Alaa Eyerman November 15, 1930 [id number]   Phone call to [facility], spoke with Helene Kelp. Referral completed requesting assistance with placing grab bars in patient's shower to assist with avoiding falls. Per Helene Kelp, she will contact patient's daughter within 2 weeks to schedule a time to visit the home to take measurements and install the bar.    [name] [facility] Care Management [phone]

## 2017-07-13 ENCOUNTER — Telehealth: Payer: Self-pay | Admitting: Family Medicine

## 2017-07-13 ENCOUNTER — Other Ambulatory Visit: Payer: Self-pay | Admitting: *Deleted

## 2017-07-13 NOTE — Telephone Encounter (Signed)
Patient's daughter in law notified, they will call to schedule a follow up if needed.

## 2017-07-13 NOTE — Telephone Encounter (Signed)
Have them keep monitoring it. As long as he's asymptomatic, we can get him in next week to recheck and adjust his meds if needed.

## 2017-07-13 NOTE — Telephone Encounter (Signed)
Attempted to reach [place] to give verbals. No answer. Requested call back.

## 2017-07-13 NOTE — Telephone Encounter (Signed)
TC from Todd [name], Amy, who is at his home at this time. She is reporting his blood pressure is high. B/P at 11:15 182/80 left arm and 180/80 right arm. He took his am meds between 10:00-10:30. He is asymptomatic. B/P at 12:05 is 179/84 while sitting, HR 68. She has completed visit at this time.  Advise [name], daughter in law at [phone].

## 2017-07-13 NOTE — Patient Outreach (Addendum)
[facility]) Care Management  07/13/2017  Larkin Morelos Dec 18, 1930 [id number]   Phone call to patient's daughter Ardeen Fillers to confirm that the referral has been made for patient's grab bars to be installed and that she should be receiving a call from [employer] within the next 2 weeks. Voicemail message left for a return call.  Successful phone call to patient's daughter Ardeen Fillers, informing her of referral to [place] to install the grab bars and to expect a call from them to schedule a time to visit the home for measurements.   Plan: This Education officer, museum will follow up with patient within 2 weeks   [name] [facility] Care Management [phone]

## 2017-07-13 NOTE — Telephone Encounter (Signed)
Dr.Johnson, can you look at this please

## 2017-07-16 ENCOUNTER — Other Ambulatory Visit: Payer: Self-pay

## 2017-07-16 ENCOUNTER — Emergency Department: Payer: PPO

## 2017-07-16 ENCOUNTER — Emergency Department
Admission: EM | Admit: 2017-07-16 | Discharge: 2017-07-16 | Disposition: A | Discharge status: Home / Self Care | Payer: PPO | Attending: Emergency Medicine | Admitting: Emergency Medicine

## 2017-07-16 DIAGNOSIS — R0789 Other chest pain: Secondary | ICD-10-CM | POA: Diagnosis not present

## 2017-07-16 DIAGNOSIS — I2510 Atherosclerotic heart disease of native coronary artery without angina pectoris: Secondary | ICD-10-CM | POA: Insufficient documentation

## 2017-07-16 DIAGNOSIS — Z79899 Other long term (current) drug therapy: Secondary | ICD-10-CM | POA: Insufficient documentation

## 2017-07-16 DIAGNOSIS — R079 Chest pain, unspecified: Secondary | ICD-10-CM

## 2017-07-16 DIAGNOSIS — Z8673 Personal history of transient ischemic attack (TIA), and cerebral infarction without residual deficits: Secondary | ICD-10-CM | POA: Insufficient documentation

## 2017-07-16 DIAGNOSIS — Z7982 Long term (current) use of aspirin: Secondary | ICD-10-CM | POA: Diagnosis not present

## 2017-07-16 DIAGNOSIS — Z7902 Long term (current) use of antithrombotics/antiplatelets: Secondary | ICD-10-CM | POA: Insufficient documentation

## 2017-07-16 DIAGNOSIS — Z96653 Presence of artificial knee joint, bilateral: Secondary | ICD-10-CM | POA: Diagnosis not present

## 2017-07-16 DIAGNOSIS — I10 Essential (primary) hypertension: Secondary | ICD-10-CM | POA: Diagnosis not present

## 2017-07-16 DIAGNOSIS — E119 Type 2 diabetes mellitus without complications: Secondary | ICD-10-CM | POA: Diagnosis not present

## 2017-07-16 DIAGNOSIS — Z87891 Personal history of nicotine dependence: Secondary | ICD-10-CM | POA: Diagnosis not present

## 2017-07-16 DIAGNOSIS — Z8546 Personal history of malignant neoplasm of prostate: Secondary | ICD-10-CM | POA: Diagnosis not present

## 2017-07-16 LAB — TROPONIN I
Troponin I: 0.03 ng/mL (ref <0.03)
Troponin I: 0.03 ng/mL (ref <0.03)

## 2017-07-16 LAB — COMPREHENSIVE METABOLIC PANEL
ALT: 10 U/L — ABNORMAL LOW (ref 17–63)
AST: 23 U/L (ref 15–41)
Albumin: 3.8 g/dL (ref 3.5–5.0)
Alkaline Phosphatase: 81 U/L (ref 38–126)
Anion gap: 10 (ref 5–15)
BUN: 24 mg/dL — ABNORMAL HIGH (ref 6–20)
CO2: 26 mmol/L (ref 22–32)
Calcium: 9 mg/dL (ref 8.9–10.3)
Chloride: 101 mmol/L (ref 101–111)
Creatinine, Ser: 1.19 mg/dL (ref 0.61–1.24)
GFR calc Af Amer: >60 mL/min (ref >60)
GFR calc non Af Amer: 54 mL/min — ABNORMAL LOW (ref >60)
Glucose, Bld: 251 mg/dL — ABNORMAL HIGH (ref 65–99)
Potassium: 3.9 mmol/L (ref 3.5–5.1)
Sodium: 137 mmol/L (ref 135–145)
Total Bilirubin: 0.7 mg/dL (ref 0.3–1.2)
Total Protein: 6.9 g/dL (ref 6.5–8.1)

## 2017-07-16 LAB — CBC WITH DIFFERENTIAL/PLATELET
Basophils Absolute: 0.1 10*3/uL (ref 0–0.1)
Basophils Relative: 1 %
Eosinophils Absolute: 0.2 10*3/uL (ref 0–0.7)
Eosinophils Relative: 4 %
HCT: 36.2 % — ABNORMAL LOW (ref 40.0–52.0)
Hemoglobin: 11.7 g/dL — ABNORMAL LOW (ref 13.0–18.0)
Lymphocytes Relative: 22 %
Lymphs Abs: 1.2 10*3/uL (ref 1.0–3.6)
MCH: 31.1 pg (ref 26.0–34.0)
MCHC: 32.4 g/dL (ref 32.0–36.0)
MCV: 95.9 fL (ref 80.0–100.0)
Monocytes Absolute: 0.7 10*3/uL (ref 0.2–1.0)
Monocytes Relative: 13 %
Neutro Abs: 3.2 10*3/uL (ref 1.4–6.5)
Neutrophils Relative %: 60 %
Platelets: 212 10*3/uL (ref 150–440)
RBC: 3.78 MIL/uL — ABNORMAL LOW (ref 4.40–5.90)
RDW: 13.6 % (ref 11.5–14.5)
WBC: 5.3 10*3/uL (ref 3.8–10.6)

## 2017-07-16 MED ORDER — ASPIRIN 81 MG PO CHEW
162.0000 mg | CHEWABLE_TABLET | Freq: Once | ORAL | Status: AC
  Administered 2017-07-16: 162 mg via ORAL
  Filled 2017-07-16: qty 2

## 2017-07-16 NOTE — Telephone Encounter (Signed)
Caller name: Izora Gala OT with [facility] Can be reached: [phone]  Reason for call: returning call for VO, has confidential VM ok to leave msg - requesting 2x week for 2 weeks

## 2017-07-16 NOTE — Telephone Encounter (Signed)
Verbal given 

## 2017-07-16 NOTE — Discharge Instructions (Signed)
Please make an appointment to follow-up with your cardiologist in 2 days for reexamination.  Return to the emergency department sooner for any concerns whatsoever.  It was a pleasure to take care of you today, and thank you for coming to our emergency department.  If you have any questions or concerns before leaving please ask the nurse to grab me and I'm more than happy to go through your aftercare instructions again.  If you were prescribed any opioid pain medication today such as Norco, Vicodin, Percocet, morphine, hydrocodone, or oxycodone please make sure you do not drive when you are taking this medication as it can alter your ability to drive safely.  If you have any concerns once you are home that you are not improving or are in fact getting worse before you can make it to your follow-up appointment, please do not hesitate to call 911 and come back for further evaluation.  Darel Hong, MD  Results for orders placed or performed during the hospital encounter of 07/16/17  Comprehensive metabolic panel  Result Value Ref Range   Sodium 137 135 - 145 mmol/L   Potassium 3.9 3.5 - 5.1 mmol/L   Chloride 101 101 - 111 mmol/L   CO2 26 22 - 32 mmol/L   Glucose, Bld 251 (H) 65 - 99 mg/dL   BUN 24 (H) 6 - 20 mg/dL   Creatinine, Ser 1.19 0.61 - 1.24 mg/dL   Calcium 9.0 8.9 - 10.3 mg/dL   Total Protein 6.9 6.5 - 8.1 g/dL   Albumin 3.8 3.5 - 5.0 g/dL   AST 23 15 - 41 U/L   ALT 10 (L) 17 - 63 U/L   Alkaline Phosphatase 81 38 - 126 U/L   Total Bilirubin 0.7 0.3 - 1.2 mg/dL   GFR calc non Af Amer 54 (L) >60 mL/min   GFR calc Af Amer >60 >60 mL/min   Anion gap 10 5 - 15  Troponin I  Result Value Ref Range   Troponin I 0.03 (HH) <0.03 ng/mL  CBC with Differential  Result Value Ref Range   WBC 5.3 3.8 - 10.6 K/uL   RBC 3.78 (L) 4.40 - 5.90 MIL/uL   Hemoglobin 11.7 (L) 13.0 - 18.0 g/dL   HCT 36.2 (L) 40.0 - 52.0 %   MCV 95.9 80.0 - 100.0 fL   MCH 31.1 26.0 - 34.0 pg   MCHC 32.4 32.0 -  36.0 g/dL   RDW 13.6 11.5 - 14.5 %   Platelets 212 150 - 440 K/uL   Neutrophils Relative % 60 %   Neutro Abs 3.2 1.4 - 6.5 K/uL   Lymphocytes Relative 22 %   Lymphs Abs 1.2 1.0 - 3.6 K/uL   Monocytes Relative 13 %   Monocytes Absolute 0.7 0.2 - 1.0 K/uL   Eosinophils Relative 4 %   Eosinophils Absolute 0.2 0 - 0.7 K/uL   Basophils Relative 1 %   Basophils Absolute 0.1 0 - 0.1 K/uL  Troponin I  Result Value Ref Range   Troponin I 0.03 (HH) <0.03 ng/mL   Dg Chest Port 1 View  Result Date: 07/16/2017 CLINICAL DATA:  Chest pain this morning which has since resolved. EXAM: PORTABLE CHEST 1 VIEW COMPARISON:  03/17/2017 FINDINGS: Lungs are hypoinflated and otherwise clear. Mild stable cardiomegaly. Remainder of the exam is unchanged. IMPRESSION: Hypoinflation without acute cardiopulmonary disease. Mild stable cardiomegaly. Electronically Signed   By: Marin Olp M.D.   On: 07/16/2017 12:41

## 2017-07-16 NOTE — ED Triage Notes (Signed)
Chest pain while eating breakfast this am. Denies pain at present. Resp unlabored. History of CVA with R sided weakness.

## 2017-07-16 NOTE — ED Provider Notes (Signed)
[facility] Emergency Department Provider Note  ____________________________________________   First MD Initiated Contact with Patient 07/16/17 1143     (approximate)  I have reviewed the triage vital signs and the nursing notes.   HISTORY  Chief Complaint Chest Pain  Level 5 exemption history limited by the patient's aphasia  HPI Todd [name] is a 84 y.o. male who comes to the emergency department by EMS for chest pain.  Apparently the patient had some sort of chest pain this morning after breakfast and the patient did not want to come to the hospital however family insisted and called 54.  By the time the patient got into the ambulance his pain was resolved.  Further history is limited as the patient has difficulty speaking secondary to a recent stroke.  05/18/17 Echo: Study Conclusions  - Left ventricle: The cavity size was normal. There was moderate   concentric hypertrophy. Systolic function was normal. The   estimated ejection fraction was in the range of 55% to 60%. Wall   motion was normal; there were no regional wall motion   abnormalities. Doppler parameters are consistent with abnormal   left ventricular relaxation (grade 1 diastolic dysfunction). - Aortic valve: There was trivial regurgitation. - Mitral valve: Calcified annulus. - Left atrium: The atrium was mildly dilated.  Past Medical History:  Diagnosis Date  . Accelerated hypertension 03/04/2015  . Anxiety   . Aortic regurgitation   . Arthritis    osteoarthritis-left knee  . CAD (coronary artery disease)   . Depression   . Diabetes mellitus without complication ([place])   . ED (erectile dysfunction)   . GERD (gastroesophageal reflux disease)   . Glaucoma   . Hyperlipidemia   . Hypertension   . Iron deficiency anemia due to chronic blood loss   . Left heart failure ([place])   . Lumbago    Lumbosacral Neuritis  . Prostate cancer ([place])   . Stroke [facility])     Patient Active  Problem List   Diagnosis Date Noted  . Aphasia 05/18/2017  . CAD (coronary artery disease) 05/18/2017  . Acute CVA (cerebrovascular accident) ([place]) 05/18/2017  . Chest pain 03/17/2017  . 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 Urinary incontinence 04/27/2015  . Anxiety, generalized 04/07/2015  . History of BCG vaccination 04/07/2015  . GERD (gastroesophageal reflux disease) 03/04/2015  . Anxiety 03/04/2015  . Fatigue 03/01/2015  . Depression 02/25/2015  . Degeneration of intervertebral disc of lumbar region 02/23/2015  . Neuritis or radiculitis due to rupture of lumbar intervertebral disc 02/23/2015  . Lumbar canal stenosis 02/23/2015  . Hypertension 12/02/2014  . Diabetes mellitus without complication ([place]) 72/62/0355  . Benign essential tremor 03/13/2014  . Imbalance 03/13/2014  . CA of prostate ([place]) 10/07/2012  . Malignant neoplasm of prostate ([place]) 10/07/2012  . ED (erectile dysfunction) of organic origin 02/09/2012    Past Surgical History:  Procedure Laterality Date  . CARPAL TUNNEL RELEASE    . REPLACEMENT TOTAL KNEE BILATERAL      Prior to Admission medications   Medication Sig Start Date End Date Taking? Authorizing Provider  aspirin EC 81 MG tablet Take 81 mg by mouth daily.    [provider]  atorvastatin (LIPITOR) 10 MG tablet Take 1 tablet (10 mg total) by mouth daily. 04/24/17   Minna Merritts, MD  Blood Glucose Monitoring Suppl (ACCU-CHEK AVIVA PLUS) w/Device KIT Use to check sugar levels x 2 daily Patient not taking: Reported on 07/04/2017 06/13/17  Guadalupe Maple, MD  busPIRone (BUSPAR) 10 MG tablet Take 10 mg by mouth at bedtime.     [provider]  clonazePAM (KLONOPIN) 0.5 MG tablet Take 1 tablet (0.5 mg total) by mouth daily as needed for anxiety. 05/21/17   Hillary Bow, MD  clopidogrel (PLAVIX) 75 MG tablet Take 1 tablet (75 mg total) by mouth daily. 07/10/17   Guadalupe Maple, MD  DULoxetine (CYMBALTA) 20 MG capsule  Take 1 capsule (20 mg total) by mouth daily. Patient taking differently: Take 20 mg by mouth at bedtime.  12/05/16   Guadalupe Maple, MD  gabapentin (NEURONTIN) 100 MG capsule Take 100 mg by mouth 2 (two) times daily.     [provider]  hydrochlorothiazide (HYDRODIURIL) 25 MG tablet Take 1 tablet (25 mg total) by mouth daily. 06/13/17   Guadalupe Maple, MD  Lancets (ACCU-CHEK SOFT TOUCH) lancets Use as instructed Patient not taking: Reported on 07/04/2017 06/13/17   Guadalupe Maple, MD  latanoprost (XALATAN) 0.005 % ophthalmic solution Place 1 drop into both eyes at bedtime.  06/08/16   [provider]  metFORMIN (GLUCOPHAGE) 500 MG tablet Take 1 tablet (500 mg total) by mouth daily with breakfast. 01/09/17   Park Liter P, DO  pantoprazole (PROTONIX) 40 MG tablet Take 1 tablet (40 mg total) by mouth daily. 04/24/17   Minna Merritts, MD  propranolol ER (INDERAL LA) 80 MG 24 hr capsule Take 1 capsule (80 mg total) by mouth daily. 12/05/16   Guadalupe Maple, MD  telmisartan (MICARDIS) 80 MG tablet Take 1 tablet (80 mg total) by mouth daily. 12/05/16   Guadalupe Maple, MD    Allergies Fluoxetine and Meloxicam  Family History  Problem Relation Age of Onset  . Stroke Mother   . Hypertension Mother   . Hypertension Father   . Stroke Sister   . Prostate cancer Neg Hx   . Kidney cancer Neg Hx   . Bladder Cancer Neg Hx     Social History Social History   Tobacco Use  . Smoking status: Former Smoker    Last attempt to quit: 12/02/1994    Years since quitting: 22.6  . Smokeless tobacco: Never Used  Substance Use Topics  . Alcohol use: Yes    Alcohol/week: 0.6 oz    Types: 1 Cans of beer per week  . Drug use: No    Review of Systems Level 5 exemption history limited by the patient's aphasia  ____________________________________________   PHYSICAL EXAM:  VITAL SIGNS: ED Triage Vitals  Enc Vitals Group     BP      Pulse      Resp      Temp      Temp src       SpO2      Weight      Height      Head Circumference      Peak Flow      Pain Score      Pain Loc      Pain Edu?      Excl. in [place]?     Constitutional: Pleasant cooperative no distress aphasia Eyes: PERRL EOMI. Head: Atraumatic. Nose: No congestion/rhinnorhea. Mouth/Throat: No trismus Neck: No stridor.   Cardiovascular: Normal rate, regular rhythm. Grossly normal heart sounds.  Good peripheral circulation. Respiratory: Normal respiratory effort.  No retractions. Lungs CTAB and moving good air Gastrointestinal: Soft nontender Musculoskeletal: No lower extremity edema   Neurologic: Significant aphasia  and right-sided deficits Skin:  Skin is warm, dry and intact. No rash noted.     ____________________________________________   DIFFERENTIAL includes but not limited to  Acute coronary syndrome, aortic dissection, pulmonary embolism ____________________________________________   LABS (all labs ordered are listed, but only abnormal results are displayed)  Labs Reviewed  COMPREHENSIVE METABOLIC PANEL - Abnormal; Notable for the following components:      Result Value   Glucose, Bld 251 (*)    BUN 24 (*)    ALT 10 (*)    GFR calc non Af Amer 54 (*)    All other components within normal limits  TROPONIN I - Abnormal; Notable for the following components:   Troponin I 0.03 (*)    All other components within normal limits  CBC WITH DIFFERENTIAL/PLATELET - Abnormal; Notable for the following components:   RBC 3.78 (*)    Hemoglobin 11.7 (*)    HCT 36.2 (*)    All other components within normal limits  TROPONIN I - Abnormal; Notable for the following components:   Troponin I 0.03 (*)    All other components within normal limits    Lab work reviewed by me with stable troponin x2 __________________________________________  EKG  ED ECG REPORT I, Darel Hong, the attending physician, personally viewed and interpreted this ECG.  Date: 07/16/2017 EKG Time:  Rate:  67 Rhythm: normal sinus rhythm with frequent premature ventricular contractions QRS Axis: Leftward axis Intervals: First-degree AV block ST/T Wave abnormalities: normal Narrative Interpretation: no evidence of acute ischemia  ____________________________________________  RADIOLOGY  Chest x-ray reviewed by me with no acute disease ____________________________________________   PROCEDURES  Procedure(s) performed: no  Procedures  Critical Care performed: no  Observation: no ____________________________________________   INITIAL IMPRESSION / ASSESSMENT AND PLAN / ED COURSE  Pertinent labs & imaging results that were available during my care of the patient were reviewed by me and considered in my medical decision making (see chart for details).       ----------------------------------------- 1:27 PM on 07/16/2017 -----------------------------------------  Family now at bedside able to provide some further collateral history.  Apparently the patient had a 10-minute episode of chest heaviness.  Nonexertional.  Nonradiating.  No nausea or diaphoresis.  No history of the same. ____________________________________________   ----------------------------------------- 4:09 PM on 07/16/2017 -----------------------------------------  The patient's second troponin remains only minimally back to his cardiologist Dr. Rockey Situ for a 2-day recheck.  Strict return precautions have been given and the patient's family verbalizes understanding and agreement with the plan.  FINAL CLINICAL IMPRESSION(S) / ED DIAGNOSES  Final diagnoses:  Chest pain, unspecified type      NEW MEDICATIONS STARTED DURING THIS VISIT:  New Prescriptions   No medications on file     Note:  This document was prepared using Dragon voice recognition software and may include unintentional dictation errors.     Darel Hong, MD 07/16/17 1609

## 2017-07-16 NOTE — ED Notes (Signed)
Dr. Rifenbark at bedside 

## 2017-07-17 ENCOUNTER — Other Ambulatory Visit: Payer: Self-pay | Admitting: *Deleted

## 2017-07-17 NOTE — Patient Outreach (Signed)
[facility]) Care Management  07/17/2017  Tellis Spivak [birth year] [id number]   Transition of care call   83 yo male recent admission to [facility] on 12/13, Dx: CVA, discharged on 12/17 to [facility] Discharged from [facility] on 06/12/17 PMHx : Diabetes, CVA, Aphasia, Hypertension, CAD, Anxiety,Hyperlipedemia  Successful telephone outreach to patient daughter, [name], HIPAA verified. Daughter discussed patient ED visit on yesterday related to complaint of chest pain, she discussed she believes patient got upset about something.  Reports patient doing good on today from report today and denies chest pain.    Daughter discussed that daughter has been checking patient blood pressure and it has been doing okay , she is unable to recall recent home reading.   Daughter discussed all home care services are in place with [facility], speech, occupational , physical therapy and with bath aide services beginning on today.   Patient has completed 30 day of transition of care, will continue to follow for complex care management services. Daughter denies any new concerns at this time, other than asking about follow up on bars for bathroom, informed of LCSW planned  follow up call in the next week and anticipate phone call from [place] regarding visit for measurements for bars.   Plan Will place call to patient daughter in law in the next week for follow up, and to schedule home visit in the next 2 weeks.    [name], RN, [place] Management Coordinator  [phone]- Mobile [phone]- Toll Free Main Office

## 2017-07-20 ENCOUNTER — Telehealth: Payer: Self-pay | Admitting: Family Medicine

## 2017-07-20 NOTE — Telephone Encounter (Signed)
Routing to provider for verbal order. 

## 2017-07-20 NOTE — Telephone Encounter (Signed)
Copied from [place]. Topic: General - Other >> Jul 20, 2017 11:40 AM Synthia Innocent wrote: Reason for CRM: [facility] calling needing verbal order for Speech therapy for visit that was missed this week, to be made up next week

## 2017-07-23 ENCOUNTER — Telehealth: Payer: Self-pay

## 2017-07-23 NOTE — Telephone Encounter (Signed)
Call pt ok 

## 2017-07-23 NOTE — Telephone Encounter (Signed)
Routing to provider, FYI.  

## 2017-07-23 NOTE — Telephone Encounter (Signed)
Called and let Amy know that [name] gave the verbal OK for speech therapy order.

## 2017-07-24 ENCOUNTER — Encounter: Payer: Self-pay | Admitting: Nurse Practitioner

## 2017-07-24 ENCOUNTER — Ambulatory Visit: Payer: PPO | Admitting: Nurse Practitioner

## 2017-07-24 ENCOUNTER — Other Ambulatory Visit: Payer: Self-pay | Admitting: *Deleted

## 2017-07-24 ENCOUNTER — Telehealth: Payer: Self-pay

## 2017-07-24 VITALS — BP 140/72 | HR 69 | Ht 67.5 in | Wt 182.2 lb

## 2017-07-24 DIAGNOSIS — E782 Mixed hyperlipidemia: Secondary | ICD-10-CM

## 2017-07-24 DIAGNOSIS — I10 Essential (primary) hypertension: Secondary | ICD-10-CM | POA: Diagnosis not present

## 2017-07-24 DIAGNOSIS — R072 Precordial pain: Secondary | ICD-10-CM

## 2017-07-24 DIAGNOSIS — I2510 Atherosclerotic heart disease of native coronary artery without angina pectoris: Secondary | ICD-10-CM | POA: Diagnosis not present

## 2017-07-24 NOTE — Patient Instructions (Signed)
Medication Instructions:  Your physician recommends that you continue on your current medications as directed. Please refer to the Current Medication list given to you today.   Call us if your blood pressure continues to remain high.    Labwork: none  Testing/Procedures: none  Follow-Up: Your physician recommends that you schedule a follow-up appointment in: [place].  If you need a refill on your cardiac medications before your next appointment, please call your pharmacy.

## 2017-07-24 NOTE — Telephone Encounter (Signed)
Copied from [place]. Topic: General - Other >> Jul 20, 2017 11:40 AM Synthia Innocent wrote: Reason for CRM: [facility] calling needing verbal order for Speech therapy for visit that was missed this week, to be made up next week  >> Jul 23, 2017 12:53 PM Vernona Rieger wrote: Amy from [facility] speech called and went over to his house this morning. His Heart Rate was 50 and irregular. She said this is her policy to let Dr Jeananne Rama know. He is fine now & at home. Her call back is [phone] if you need to speak with her. He does have a cardiology appt tomorrow.

## 2017-07-24 NOTE — Telephone Encounter (Signed)
FYI only.

## 2017-07-24 NOTE — Progress Notes (Signed)
Office Visit    Patient Name: Todd [name] Date of Encounter: 07/24/2017  Primary Care Provider:  Guadalupe Maple, MD Primary Cardiologist:  Ida Rogue, MD  Chief Complaint    84 year old male with a reported history of small vessel CAD, hypertension, hyperlipidemia, diabetes, diastolic dysfunction, GERD, tremor, and stroke in December 2018, who presents for follow-up after recent ER evaluation for chest pain.  Past Medical History    Past Medical History:  Diagnosis Date  . Anxiety   . Aortic regurgitation    a. 03/2017 Echo: Mod AI; b. 05/2017 Echo: Triv AI.  [name] Kitchen Arthritis    osteoarthritis-left knee  . CAD (coronary artery disease)    a. Reported h/o cath ~ 2008 - ? small vessel dzs. No PCI performed; b. 03/2017 MV: EF 45-54%, no ischemia/infarct.  . Depression   . Diabetes mellitus without complication ([place])   . Diastolic dysfunction    a. 03/2017 Echo: EF 55-65%; b. 05/2017 Echo: EF 55-60%, no rwma, Gr1 DD. Triv AI.  Mildly dil LA.  . ED (erectile dysfunction)   . Elevated troponin    a. chronic mild elevation - 0.03->0.04.  [name] Kitchen GERD (gastroesophageal reflux disease)   . Glaucoma   . Hyperlipidemia   . Hypertension   . Iron deficiency anemia due to chronic blood loss   . Left pontine stroke w/ cerebrovascular disease(HCC)    a. 05/2017 MRI/A: acute to subacute L pontine infarct. Chronic left thalamic lacunar infarct. Severe basilar artery stenosis w/ radiographic string sign of the distal 1/3. Mild to moderate L PCA P2 segment stenosis; b. 05/2017 Carotid U/S: <50% bilat ICA stenosis.  . Lumbago    Lumbosacral Neuritis  . Prostate cancer [facility])    Past Surgical History:  Procedure Laterality Date  . CARPAL TUNNEL RELEASE    . REPLACEMENT TOTAL KNEE BILATERAL      Allergies  Allergies  Allergen Reactions  . Amlodipine     Lower extremity swelling at 10 mg.  . Fluoxetine Other (See Comments)    headache  . Meloxicam Other (See Comments)    Other  reaction(s): Dizziness Nervousness.     History of Present Illness    84 year old male with the above past medical history including prior history of chest pain and reported history of small vessel CAD, hypertension, hyperlipidemia, diabetes, diastolic dysfunction, GERD, iron deficiency anemia, and stroke.  In October 2018, he was admitted to [facility] with chest pain.  Echocardiogram showed normal LV function while stress testing was undertaken and was nonischemic.  He was last seen in clinic in late November 2018, at which time he was hypertensive and amlodipine therapy was titrated.  Unfortunately, in mid December, he was admitted with altered mental status and finding of left pontine stroke.  Echo during that admission again showed normal LV function with trivial aortic insufficiency.  Patient was subsequently discharged to rehab since been living at home with his family.  Of note, while in rehab, he developed ankle edema on amlodipine and this was discontinued in favor of HCTZ.  Since being at home, he does walk some but overall, activity is limited.  He remains aphasic but per family, he has made strides in that area.  On February 11, he was taken to the emergency department after apparently complaining of chest pain to a home aide.  Family says that he was denying experience any chest pain in the emergency room and even today, he denies chest pain that day.  In  the ER, ECG was nonacute.  Troponin was mildly elevated at 0.03 x 2, which was similar to what was seen in December 2018 and also in September 2016.  As a result, he was discharged home.  Since that ER visit, he has not had any other complaints of chest pain.  He denies dyspnea, PND, orthopnea, dizziness, syncope, edema, or early satiety.  Home Medications    Prior to Admission medications   Medication Sig Start Date End Date Taking? Authorizing Provider  aspirin EC 81 MG tablet Take 81 mg by mouth daily.   Yes [provider]  atorvastatin (LIPITOR) 10 MG tablet Take 1 tablet (10 mg total) by mouth daily. 04/24/17  Yes Gollan, Kathlene November, MD  Blood Glucose Monitoring Suppl (ACCU-CHEK AVIVA PLUS) w/Device KIT Use to check sugar levels x 2 daily 06/13/17  Yes Crissman, Jeannette How, MD  busPIRone (BUSPAR) 10 MG tablet Take 10 mg by mouth at bedtime.    Yes [provider]  clonazePAM (KLONOPIN) 0.5 MG tablet Take 1 tablet (0.5 mg total) by mouth daily as needed for anxiety. 05/21/17  Yes Sudini, Alveta Heimlich, MD  clopidogrel (PLAVIX) 75 MG tablet Take 1 tablet (75 mg total) by mouth daily. 07/10/17  Yes Crissman, Jeannette How, MD  DULoxetine (CYMBALTA) 20 MG capsule Take 1 capsule (20 mg total) by mouth daily. Patient taking differently: Take 20 mg by mouth at bedtime.  12/05/16  Yes Crissman, Jeannette How, MD  gabapentin (NEURONTIN) 100 MG capsule Take 100 mg by mouth 2 (two) times daily.    Yes [provider]  hydrochlorothiazide (HYDRODIURIL) 25 MG tablet Take 1 tablet (25 mg total) by mouth daily. 06/13/17  Yes Guadalupe Maple, MD  Lancets (ACCU-CHEK SOFT TOUCH) lancets Use as instructed 06/13/17  Yes Crissman, Jeannette How, MD  latanoprost (XALATAN) 0.005 % ophthalmic solution Place 1 drop into both eyes at bedtime.  06/08/16  Yes [provider]  metFORMIN (GLUCOPHAGE) 500 MG tablet Take 1 tablet (500 mg total) by mouth daily with breakfast. 01/09/17  Yes Johnson, Megan P, DO  pantoprazole (PROTONIX) 40 MG tablet Take 1 tablet (40 mg total) by mouth daily. 04/24/17  Yes Gollan, Kathlene November, MD  propranolol ER (INDERAL LA) 80 MG 24 hr capsule Take 1 capsule (80 mg total) by mouth daily. 12/05/16  Yes Crissman, Jeannette How, MD  telmisartan (MICARDIS) 80 MG tablet Take 1 tablet (80 mg total) by mouth daily. 12/05/16  Yes Crissman, Jeannette How, MD    Review of Systems    Recently seen in the ER for chest pain though he denies ever experiencing chest pain he further denies any dyspnea, PND, orthopnea, dizziness, syncope, edema, or  early satiety.  All other systems reviewed and are otherwise negative except as noted above.  Physical Exam    VS:  BP 140/72 (BP Location: Left Arm, Patient Position: Sitting, Cuff Size: Normal)   Pulse 69   Ht 5' 7.5" (1.715 m)   Wt 182 lb 4 oz (82.7 kg)   BMI 28.12 kg/m  , BMI Body mass index is 28.12 kg/m. GEN: Well nourished, well developed, in no acute distress.  HEENT: normal.  Neck: Supple, no JVD, carotid bruits, or masses. Cardiac: RRR, no murmurs, rubs, or gallops. No clubbing, cyanosis, edema.  Radials/DP/PT 2+ and equal bilaterally.  Respiratory:  Respirations regular and unlabored, clear to auscultation bilaterally. GI: Soft, nontender, nondistended, BS + x 4. MS: no deformity or atrophy. Skin: warm and dry, no rash.  Neuro:  Strength and sensation are intact.  Aphasic. Psych: Normal affect.  Accessory Clinical Findings    ECG -regular sinus rhythm, 69, left axis deviation, baseline artifact, LVH, no acute changes.  Assessment & Plan    1.  Chest pain/CAD: Patient recently evaluated in the emergency department possibly because of complaints of chest pain at home.  This was noticed by a home health aide though patient denies experiencing chest pain.  In the ER, evaluation was notable only for minimal troponin elevation of 0.03 x 2.  He was discharged home in the setting of stable, chronic troponin elevation.  He has not had any other complaints of chest pain.  He had a negative Myoview in October 2018 with normal LV function by echo.  No further plans for ischemic evaluation at this time.  2.  Essential hypertension: Blood pressure mildly elevated today at 140/72.  Family says that his blood pressure is checked daily by a sister-in-law and that they think it trends better than this.  I have asked him to follow his blood pressure daily and call us if pressures are consistently greater than 140 at which time, we can consider adding hydralazine 10 mg 3 times daily.  Of note, he  was previously on amlodipine and apparently this worked pretty well but he developed ankle swelling.  He otherwise remains on Micardis, propranolol (initially prescribed for tremors), and HCTZ.  3.  Left pontine stroke: Diagnosed in December.  He remains aphasic.  No significant physical limitations.  He is on aspirin, Plavix, and statin therapy.  4.  Type 2 diabetes mellitus: On metformin and managed by primary care.  5.  Hyperlipidemia: On Lipitor therapy with an LDL of 67 in December 2018.  Normal LFTs last week.  6.  Disposition: Follow-up in clinic in 6 months or sooner if necessary.   Murray Hodgkins, NP 07/24/2017, 5:14 PM

## 2017-07-24 NOTE — Patient Outreach (Signed)
[place] [facility]) Care Management  07/24/2017  Todd [name] [birth year] [id number]   Phone call to patient's daughter Ardeen Fillers to follow up on referral for grab bars. Per patient's daughter, she has not received a call yet.  [facility] [place] did state that it would be approximately 2 weeks before the family would be contacted.   Plan: This social worker will follow up with [place] [phone] within 2 weeks to follow up on grab bars installation.    [name] [facility] Care Management [phone]

## 2017-07-24 NOTE — Patient Outreach (Signed)
[name]) Care Management  07/24/2017  Todd [name] [birth year] [id number]  Transition of care call  Successful outreach call to patient , daughter [name], caregiver, HIPAA information verified.   Family discussed patient is doing fairly well, remains active with home health , physical therapy, occupational therapy and bath aide, patient continues to tolerate participation in therapy.  Discussed with caregiver regarding blood pressure monitoring at home , [name] discussed she continues to keep a record of blood pressure readings, report good reading today at home 146/74.  Discussed follow up home visit, patient and his son agreeable.    Plan Will plan follow up home visit in the next week, for complex care management education and support post CVA and hypertension .   [name], RN, [place] Management Coordinator  [phone]- Mobile [phone]- Toll Free Main Office

## 2017-07-25 ENCOUNTER — Telehealth: Payer: Self-pay | Admitting: Family Medicine

## 2017-07-25 NOTE — Telephone Encounter (Signed)
Copied from [place]. Topic: Quick Communication - See Telephone Encounter >> Jul 25, 2017  2:02 PM Percell Belt A wrote: CRM for notification. See Telephone encounter for:   Tollie Eth , with [facility] [phone] Needing verbal for OT  2 week 1  Add diabetic foot care Pt has one wound on his left big toe and need orders to put on dry bandage?    07/25/17.

## 2017-07-25 NOTE — Telephone Encounter (Signed)
Verbal orders given  

## 2017-07-30 ENCOUNTER — Telehealth: Payer: Self-pay | Admitting: Cardiovascular Disease

## 2017-07-30 MED ORDER — HYDRALAZINE HCL 10 MG PO TABS: 10.0000 mg | ORAL_TABLET | Freq: Three times a day (TID) | ORAL | 2 refills | Status: DC

## 2017-07-30 NOTE — Telephone Encounter (Signed)
S/w patient's daughter, ok per DPR.  Says patient's BP still elevated in the morning and throughout the day. 2/20 AM 145/67, 56  PM 130/72, 70 2/21 AM 167/89, 70  PM 160/82,  2/22 AM 146/74, 54  2/25 AM 171/72  PM 138/77  The morning blood pressures are taken about 1.5 hours after morning blood pressure medications. Patient denies chest pain, dizziness, headache or blurred vision. He has shortness of breath with exertion. They were advised to [place] if BP remain elevated. Routing to Ignacia Bayley, NP for advice.

## 2017-07-30 NOTE — Telephone Encounter (Signed)
Pt daughter is calling states pt BP is still elevated. 07/30/17-171/72 am 10:35. 12:35 pm 138/77 07/27/17-150/78 HR 66 pm/ am 167/89

## 2017-07-30 NOTE — Telephone Encounter (Signed)
Pressures are trending higher than desired.  Let's add hydralazine 10 mg TID.  Thanks.

## 2017-07-30 NOTE — Telephone Encounter (Signed)
S/w patient's daughter. She verbalized understanding to start the Hydralazine 10 mg TID. Rx sent to pharmacy. She will let us know if this does not help his BPs.

## 2017-08-01 ENCOUNTER — Other Ambulatory Visit: Payer: Self-pay | Admitting: *Deleted

## 2017-08-01 ENCOUNTER — Ambulatory Visit: Payer: Self-pay | Admitting: *Deleted

## 2017-08-01 DIAGNOSIS — G5603 Carpal tunnel syndrome, bilateral upper limbs: Secondary | ICD-10-CM | POA: Diagnosis not present

## 2017-08-01 DIAGNOSIS — F411 Generalized anxiety disorder: Secondary | ICD-10-CM | POA: Diagnosis not present

## 2017-08-01 DIAGNOSIS — I69359 Hemiplegia and hemiparesis following cerebral infarction affecting unspecified side: Secondary | ICD-10-CM | POA: Diagnosis not present

## 2017-08-01 DIAGNOSIS — I69328 Other speech and language deficits following cerebral infarction: Secondary | ICD-10-CM | POA: Diagnosis not present

## 2017-08-01 DIAGNOSIS — I6350 Cerebral infarction due to unspecified occlusion or stenosis of unspecified cerebral artery: Secondary | ICD-10-CM | POA: Diagnosis not present

## 2017-08-01 DIAGNOSIS — G250 Essential tremor: Secondary | ICD-10-CM | POA: Diagnosis not present

## 2017-08-01 DIAGNOSIS — F0150 Vascular dementia without behavioral disturbance: Secondary | ICD-10-CM | POA: Diagnosis not present

## 2017-08-01 DIAGNOSIS — Z8673 Personal history of transient ischemic attack (TIA), and cerebral infarction without residual deficits: Secondary | ICD-10-CM | POA: Diagnosis not present

## 2017-08-01 DIAGNOSIS — G479 Sleep disorder, unspecified: Secondary | ICD-10-CM | POA: Diagnosis not present

## 2017-08-01 NOTE — Patient Outreach (Signed)
[facility] [name]) Care Management  08/01/2017  Todd [name] 1931-05-22 [id number]   Telephone assessment   Telephone call to Todd [name] , daughter in law prior to arrival of scheduled visit on today, HIPAA verified. Todd [name] states patient has a neurology  appointment today, that where unaware of prior to scheduling home visit, reports attempted to contact [facility] Caregiver request return call in am to reschedule visit as she is not at home at this time to look at schedule.  Plan Will plan return call in am, and reschedule home visit.    [name], RN, [place] Management Coordinator  [phone]- Mobile [phone]- Toll Free Main Office

## 2017-08-02 ENCOUNTER — Other Ambulatory Visit: Payer: Self-pay | Admitting: *Deleted

## 2017-08-02 DIAGNOSIS — E1140 Type 2 diabetes mellitus with diabetic neuropathy, unspecified: Secondary | ICD-10-CM | POA: Diagnosis not present

## 2017-08-02 DIAGNOSIS — B351 Tinea unguium: Secondary | ICD-10-CM | POA: Diagnosis not present

## 2017-08-02 NOTE — Telephone Encounter (Signed)
Verbal given 

## 2017-08-02 NOTE — Telephone Encounter (Signed)
Tollie Eth , with [facility] [phone]   Called in for the verbal okay to switch pt's last visit for this week to next week per pt's request.    Please advise.

## 2017-08-02 NOTE — Patient Outreach (Signed)
[place] [facility]) Care Management  08/02/2017  Todd [name] 1930/09/10 [id number]   Telephone assessment   Placed call to patient daughter in law, Todd [name] regarding rescheduling home visit, HIPAA verified. Todd [name] reports patient is doing pretty good, progressing with home health therapy, speech is still the biggest concern. She discussed patient recent blood pressure readings of 140/72, reports improved, she continues to monitor daily and keep a record.  She discussed she does not add any salt to foods.  Todd [name] requested that I call patient daughter Todd [name] regarding scheduling a home visit.   [place] call to Todd [name] patient daughter , no answer able to leave a HIPAA compliant message requesting a return call. [phone] Return call from Todd [name] , HIPAA verified.  She discussed patient is progressing well at home, able to assist with his bath, still awaiting grab bar installation in home.  Discussed Advanced directive , reports her sister Todd [name] is working on that, offered assistance if needed with additional resources.   Plan Will schedule home visit for continued complex care management program, education and support.   [name], RN, [place] Management Coordinator  [phone]- Mobile [phone]- Toll Free Main Office

## 2017-08-07 ENCOUNTER — Telehealth: Payer: Self-pay | Admitting: Family Medicine

## 2017-08-07 ENCOUNTER — Other Ambulatory Visit: Payer: Self-pay | Admitting: *Deleted

## 2017-08-07 ENCOUNTER — Ambulatory Visit: Payer: PPO | Admitting: *Deleted

## 2017-08-07 NOTE — Telephone Encounter (Signed)
Copied from [place] [phone]. Topic: Quick Communication - See Telephone Encounter >> Aug 07, 2017  4:14 PM Boyd Kerbs wrote: CRM for notification. See Telephone encounter for:   Todd [name] - [phone]  [name], daughter is asking for Diabetic shoes. Has to come from PCP  08/07/17.

## 2017-08-07 NOTE — Patient Outreach (Signed)
[facility]) Care Management   08/07/2017  Todd [name] April 07, 1931 [id number]  Todd [name] is an 84 y.o. male  Subjective:  Patient states feeling okay, complains to back pain, reports he has it every day. Patient states he is able to bathe himself now, speech slow aphasic  Family discussed recent visit to podiatrist for toe nail trim, requesting Diabetic shoes states they need orders from the doctor managing patient care.    Objective:  BP 120/72 (BP Location: Left Arm, Patient Position: Sitting, Cuff Size: Normal)   Pulse 65   Resp 18   SpO2 96%   Speech aphasia  Review of Systems  Constitutional: Negative.   Eyes: Negative.   Respiratory: Negative.   Cardiovascular: Negative.   Gastrointestinal: Negative.   Genitourinary: Negative.   Musculoskeletal: Positive for back pain.  Skin: Negative.   Neurological: Negative.        Aphasia   Endo/Heme/Allergies: Negative.   Psychiatric/Behavioral: Negative.     Physical Exam  Constitutional: He is oriented to person, place, and time. He appears well-developed and well-nourished.  Cardiovascular: Normal rate and normal heart sounds.  Respiratory: Effort normal and breath sounds normal.  GI: Soft.  Neurological: He is alert and oriented to person, place, and time.  Speech aphasia   Skin: Skin is warm and dry.  Psychiatric: He has a normal mood and affect. His behavior is normal. Judgment and thought content normal.    Encounter Medications:   Outpatient Encounter Medications as of 08/07/2017  Medication Sig Note  . acetaminophen (TYLENOL 8 HOUR ARTHRITIS PAIN) 650 MG CR tablet Take 650 mg by mouth every 8 (eight) hours as needed for pain. 08/07/2017: Patient reports taking 2 tablets twice daily   . aspirin EC 81 MG tablet Take 81 mg by mouth daily.   [name] Kitchen atorvastatin (LIPITOR) 10 MG tablet Take 1 tablet (10 mg total) by mouth daily.   . busPIRone (BUSPAR) 10 MG tablet Take 10 mg by mouth at bedtime.    . clonazePAM  (KLONOPIN) 0.5 MG tablet Take 1 tablet (0.5 mg total) by mouth daily as needed for anxiety.   . clopidogrel (PLAVIX) 75 MG tablet Take 1 tablet (75 mg total) by mouth daily.   . DULoxetine (CYMBALTA) 20 MG capsule Take 1 capsule (20 mg total) by mouth daily. (Patient taking differently: Take 20 mg by mouth at bedtime. )   . gabapentin (NEURONTIN) 100 MG capsule Take 100 mg by mouth 2 (two) times daily.    . hydrALAZINE (APRESOLINE) 10 MG tablet Take 1 tablet (10 mg total) by mouth 3 (three) times daily.   . hydrochlorothiazide (HYDRODIURIL) 25 MG tablet Take 1 tablet (25 mg total) by mouth daily.   [name] Kitchen latanoprost (XALATAN) 0.005 % ophthalmic solution Place 1 drop into both eyes at bedtime.    . metFORMIN (GLUCOPHAGE) 500 MG tablet Take 1 tablet (500 mg total) by mouth daily with breakfast.   . naproxen sodium (ALEVE) 220 MG tablet Take 220 mg by mouth. 08/07/2017: Patient reports taking at 2 to 3 tablets a day.   . pantoprazole (PROTONIX) 40 MG tablet Take 1 tablet (40 mg total) by mouth daily.   . propranolol ER (INDERAL LA) 80 MG 24 hr capsule Take 1 capsule (80 mg total) by mouth daily.   [name] Kitchen telmisartan (MICARDIS) 80 MG tablet Take 1 tablet (80 mg total) by mouth daily.   . Blood Glucose Monitoring Suppl (ACCU-CHEK AVIVA PLUS) w/Device KIT Use to check sugar levels x  2 daily (Patient not taking: Reported on 08/07/2017)   . Lancets (ACCU-CHEK SOFT TOUCH) lancets Use as instructed (Patient not taking: Reported on 08/07/2017)    No facility-administered encounter medications on file as of 08/07/2017.     Functional Status:   In your present state of health, do you have any difficulty performing the following activities: 06/25/2017 05/18/2017  Hearing? Y N  Vision? N N  Difficulty concentrating or making decisions? Y N  Comment difficult expressing due to speech -  Walking or climbing stairs? Y Y  Comment using walker -  Dressing or bathing? Y N  Comment - only trouble shaving do to hands shaking"   Doing errands, shopping? Todd [name]  Comment family assist  -  Preparing Food and eating ? Y -  Comment family prepares meals  -  Using the Toilet? N -  In the past six months, have you accidently leaked urine? Y -  Do you have problems with loss of bowel control? N -  Managing your Medications? Y -  Comment famly prepares medications  -  Managing your Finances? Y -  Comment family available to assist  -  Housekeeping or managing your Housekeeping? Y -  Comment Family provides housekeeping chores  -  Some recent data might be hidden    Fall/Depression Screening:    Fall Risk  06/25/2017 06/13/2017 05/07/2017  Falls in the past year? Yes No No  Number falls in past yr: 1 - -  Injury with Fall? No - -  Risk for fall due to : History of fall(s);Impaired balance/gait - -  Follow up Falls prevention discussed - -   PHQ 2/9 Scores 07/04/2017 06/13/2017 05/07/2017 03/26/2017 10/25/2016 09/27/2016 10/11/2015  PHQ - 2 Score 1 0 0 0 _0 PHQ- 9 Score - - - - _1 Assessment:  Routine home visit, daughter in law Todd [name] and daughter Todd [name] present.    CVA- no new symptoms, patient and family reports improvements noted in speech, patient will benefit in continued follow up. Home health physical therapy visits have completed and home health speech last visit will  on 3/7, therapist  recommends outpatient speech therapy and patient and family is agreeable.  Family interested in getting medical alert button for patient .  Hypertension - improved blood pressure readings,ranges in the last week ranged 120/72 to 138/58 .  Family continues to limit salt in diet and reports patient taking medications as prescribed.  Wants Diabetes shoes - recent visit to podiatrist for  toe nail trim, needs orders from PCP for diabetic shoes and need documentation. Recent A1c 6.3, patient not monitoring blood sugar at home, family limits sweets in diet  Reported recent open area at posterior fold of left great toe  no open area noted. Reviewed diabetic foot care. . Chronic Back Pain  Patient reports daily back pain and takes prn tylenol arthritis 2 tablets twice daily as needed  alternates with aleve 2 tablets at least once a day, reports he has been taking that for a while since seeing orthopedic doctor last year.    Plan:  Placed call to [facility] volunteer department regarding medical alert system, able to leave a message on voicemail line of Producer, television/film/video.  Will follow up with [place] worker regarding Dentist for tub area by [employer]. Placed call to PCP office regarding patient/family request for Diabetic shoes.  Will send PCP this visit note as update.  Will plan follow up call within a week.   [facility] CM Care Plan Problem One     Most Recent Value  Care Plan Problem One  Recent hospital admission, rehab stay related to new stroke   Role Documenting the Problem One  Care Management [place] for Problem One  Active  [facility] Long Term Goal   Patient will not experience hospital admission in the next 60  days [goal updated ]  [facility] Long Term Goal Start Date  06/15/17  Interventions for Problem One Long Term Goal  RN reinforced continuing to give medications as prescribed, notifying MD of concerns related to new symptoms or concerns. Provided and reviewed EMMI on hypertension .   [facility] CM Short Term Goal #1   Patient/family will report obtaining wheelchair for use in the next 30 days  [updated goal]  [facility] CM Short Term Goal #1 Start Date  06/15/17  [facility] CM Short Term Goal #1 Met Date  07/24/17  [facility] CM Short Term Goal #2   Patient/family will report being able to monitor patient blood pressure readings at home in the next 30 days   [facility] CM Short Term Goal #2 Start Date  06/15/17  [facility] CM Short Term Goal #2 Met Date  07/17/17  [facility] CM Short Term Goal #3  Family will report increase in knowledge related to stroke symptoms in the next 30 days   [facility] CM Short Term Goal #3 Start  Date  07/04/17  [facility] CM Short Term Goal #3 Met Date  08/02/17  [facility] CM Short Term Goal #4  Patient/family will continue to reports improvements in speech  [goal restated ]  [facility]N CM Short Term Goal #4 Start Date  08/07/17  Interventions for Short Term Goal #4  RN reinforced continued participation in home therapy exercsise, and encouraged attending outpatient ST  [facility]N CM Short Term Goal #5   Patient/Family will report increased knowledge of low salt food choices in the next 30 day s  [facility] CM Short Term Goal #5 Start Date  08/07/17  Interventions for Short Term Goal #5  Provided and reviewed EMMI handout on low salt DASH diet      [name], RN, [place] Management Coordinator  [phone]- Mobile [phone]- [place]

## 2017-08-07 NOTE — Telephone Encounter (Signed)
Copied from [place] [id number]. Topic: Quick Communication - See Telephone Encounter >> Aug 07, 2017  3:42 PM Synthia Innocent wrote: CRM for notification. See Telephone encounter for:  Need written orders for Outpatient Speech Therapy for CVA, please fax to [phone] 08/07/17.

## 2017-08-08 NOTE — Telephone Encounter (Signed)
Message left asking if it must be PCP or if colleague is okay while pcp is out of town

## 2017-08-08 NOTE — Telephone Encounter (Signed)
Please advise 

## 2017-08-08 NOTE — Telephone Encounter (Signed)
Verbal given, left on VM. Requested call back if written order was needed. Also requested fax if written order was needed.

## 2017-08-08 NOTE — Telephone Encounter (Signed)
Left message on machine for pt to return call to the office.  

## 2017-08-08 NOTE — Telephone Encounter (Signed)
OK to give verbal orders, otherwise I'm happy to sign

## 2017-08-08 NOTE — Telephone Encounter (Signed)
CRM received stating colleague is okay to write order. Please advise.

## 2017-08-08 NOTE — Telephone Encounter (Signed)
Called daughter, [name], to advise they would need to schedule OV for face to face for insurance to pay for shoes. CRM created.

## 2017-08-08 NOTE — Telephone Encounter (Signed)
Yes, he can have them, but he needs a face to face visit with it.

## 2017-08-13 NOTE — Telephone Encounter (Signed)
Left message on machine for pt to return call to the office.  

## 2017-08-14 ENCOUNTER — Other Ambulatory Visit: Payer: Self-pay | Admitting: *Deleted

## 2017-08-14 NOTE — Telephone Encounter (Signed)
Spoke with daughter, They would like home health order to add ST. Orders were faxed back yesterday. Will contact and make sure they received fax when I return to office.

## 2017-08-14 NOTE — Patient Outreach (Signed)
[facility]) Care Management  08/14/2017  Dashiel Bergquist 10-Aug-1930 [id number]   Telephone follow up call  Successful outreach call to patient , daughter [name], HIPAA information verified.   Daughter reports patient continues to make good progress at home, with speech therapy, continues tolerating ambulation in the home and blood pressure reading under good control, she is unable to state recent reading but reports sister in law continues monitor daily and keep a record.   Daughter discussed family has contacted insurance and they are working on continuing home speech therapy a little longer prior to transition to outpatient.  Daughter reports they plan follow up visit with Dr.Crissmon regarding diabetic shoes, has upcoming appointment in April if no visit available in March.   Daughter has received phone call from [place] volunteer services regarding medical alert system, and plans follow up call.   Daughter denies any new concerns at this time.    Plan Will plan follow up call in the next 2 weeks for continued support and care coordination needs.    [name], RN, [place] Management Coordinator  [phone]- Mobile [phone]- Toll Free Main Office

## 2017-08-16 ENCOUNTER — Other Ambulatory Visit: Payer: Self-pay | Admitting: *Deleted

## 2017-08-16 NOTE — Patient Outreach (Signed)
[name]  08/16/2017  Todd [name] [birth year] [id number]   Phone call to [place] to follow up on referral to install grab bars in patient's bathroom. Voicemail message left requesting a return call.   [name] [facility] Care Management [phone]

## 2017-08-20 NOTE — Telephone Encounter (Signed)
ok 

## 2017-08-21 DIAGNOSIS — M48062 Spinal stenosis, lumbar region with neurogenic claudication: Secondary | ICD-10-CM | POA: Diagnosis not present

## 2017-08-21 NOTE — Telephone Encounter (Signed)
Refaxed orders to Amy @ [phone]

## 2017-08-21 NOTE — Telephone Encounter (Signed)
Amy from [facility] calling and states she has not received the fax for orders for outpatient speech therapy.

## 2017-08-23 ENCOUNTER — Ambulatory Visit (INDEPENDENT_AMBULATORY_CARE_PROVIDER_SITE_OTHER): Payer: PPO | Admitting: Family Medicine

## 2017-08-23 ENCOUNTER — Encounter: Payer: Self-pay | Admitting: Family Medicine

## 2017-08-23 VITALS — BP 171/104 | HR 83 | Wt 188.2 lb

## 2017-08-23 DIAGNOSIS — E119 Type 2 diabetes mellitus without complications: Secondary | ICD-10-CM

## 2017-08-23 DIAGNOSIS — I10 Essential (primary) hypertension: Secondary | ICD-10-CM | POA: Diagnosis not present

## 2017-08-23 DIAGNOSIS — I2510 Atherosclerotic heart disease of native coronary artery without angina pectoris: Secondary | ICD-10-CM | POA: Diagnosis not present

## 2017-08-23 DIAGNOSIS — N3943 Post-void dribbling: Secondary | ICD-10-CM | POA: Diagnosis not present

## 2017-08-23 DIAGNOSIS — R4701 Aphasia: Secondary | ICD-10-CM

## 2017-08-23 DIAGNOSIS — K219 Gastro-esophageal reflux disease without esophagitis: Secondary | ICD-10-CM | POA: Diagnosis not present

## 2017-08-23 DIAGNOSIS — I6930 Unspecified sequelae of cerebral infarction: Secondary | ICD-10-CM | POA: Diagnosis not present

## 2017-08-23 MED ORDER — HYDRALAZINE HCL 25 MG PO TABS: 25.0000 mg | ORAL_TABLET | Freq: Three times a day (TID) | ORAL | 3 refills | Status: DC

## 2017-08-23 NOTE — Assessment & Plan Note (Addendum)
Not under good control. Frustrated today and has been walking quite a bit. Daughter has been monitoring and it was better at home. Seeing PCP in 1 month. Will increase his hydralazine to 25mg  TID and recheck in 1 month.

## 2017-08-23 NOTE — Assessment & Plan Note (Signed)
Will get back in to see speech therapy. Referral generated today. Call with any concerns.

## 2017-08-23 NOTE — Progress Notes (Signed)
BP (!) 171/104 (BP Location: Left Arm, Cuff Size: Normal)   Pulse 83   Wt 188 lb 3 oz (85.4 kg)   SpO2 98%   BMI 29.04 kg/m    Subjective:    Patient ID: Todd [name], male    DOB: Jan 01, 1931, 84 y.o.   MRN: [id number]  HPI: Todd [name] is a 84 y.o. male who presents today with his daughter for evaluation.   Chief Complaint  Patient presents with  . Diabetes    Patient needs diabetic shoes   HYPERTENSION / HYPERLIPIDEMIA Satisfied with current treatment? no Duration of hypertension: chronic BP monitoring frequency: a few times a week BP medication side effects: no Past BP meds: telmisartan, propranolol, hydralazine Duration of hyperlipidemia: chronic Cholesterol medication side effects: no Cholesterol supplements: none Past cholesterol medications: atorvastatin Medication compliance: excellent compliance Aspirin: yes Recent stressors: yes Recurrent headaches: no Visual changes: no Palpitations: no Dyspnea: no Chest pain: no Lower extremity edema: no Dizzy/lightheaded: no  DIABETES- needs and order for diabetic shoes. Follows with podiatry. He has paresthesias in his feet and hammer toes on both feet.  Hypoglycemic episodes:no Polydipsia/polyuria: no Visual disturbance: no Chest pain: no Paresthesias: yes Glucose Monitoring: yes Taking Insulin?: no Blood Pressure Monitoring: not checking Retinal Examination: Up to Date Foot Exam: Up to Date Diabetic Education: Completed Pneumovax: Up to Date Influenza: Up to Date Aspirin: yes  Has been doing speech therapy   Relevant past medical, surgical, family and social history reviewed and updated as indicated. Interim medical history since our last visit reviewed. Allergies and medications reviewed and updated.  Review of Systems  Constitutional: Negative.   HENT: Negative.   Respiratory: Positive for shortness of breath. Negative for apnea, cough, choking, chest tightness, wheezing and stridor.     Cardiovascular: Negative.   Gastrointestinal: Negative.   Neurological: Positive for weakness. Negative for dizziness, tremors, seizures, syncope, facial asymmetry, speech difficulty, light-headedness, numbness and headaches.       Aphasia  Psychiatric/Behavioral:       Frustration at medical situation    Per HPI unless specifically indicated above     Objective:    BP (!) 171/104 (BP Location: Left Arm, Cuff Size: Normal)   Pulse 83   Wt 188 lb 3 oz (85.4 kg)   SpO2 98%   BMI 29.04 kg/m   Wt Readings from Last 3 Encounters:  08/23/17 188 lb 3 oz (85.4 kg)  07/24/17 182 lb 4 oz (82.7 kg)  07/16/17 184 lb 15.5 oz (83.9 kg)    Physical Exam  Constitutional: He is oriented to person, place, and time. He appears well-developed and well-nourished. He appears distressed.  HENT:  Head: Normocephalic and atraumatic.  Right Ear: Hearing normal.  Left Ear: Hearing normal.  Nose: Nose normal.  Eyes: Conjunctivae and lids are normal. Right eye exhibits no discharge. Left eye exhibits no discharge. No scleral icterus.  Cardiovascular: Normal rate, regular rhythm, normal heart sounds and intact distal pulses. Exam reveals no gallop and no friction rub.  No murmur heard. Pulmonary/Chest: Effort normal and breath sounds normal. No respiratory distress. He has no wheezes. He has no rales. He exhibits no tenderness.  Musculoskeletal: Normal range of motion.  Neurological: He is alert and oriented to person, place, and time. He has normal reflexes. He displays normal reflexes. No cranial nerve deficit. He exhibits abnormal muscle tone. Coordination abnormal.  Weakness on L side  Skin: Skin is warm and intact. No rash noted. He is not diaphoretic. No  erythema. No pallor.  Psychiatric: He has a normal mood and affect. His speech is normal and behavior is normal. Judgment and thought content normal. Cognition and memory are normal.  Nursing note and vitals reviewed.   Results for orders placed  or performed during the hospital encounter of 07/16/17  Comprehensive metabolic panel  Result Value Ref Range   Sodium 137 135 - 145 mmol/L   Potassium 3.9 3.5 - 5.1 mmol/L   Chloride 101 101 - 111 mmol/L   CO2 26 22 - 32 mmol/L   Glucose, Bld 251 (H) 65 - 99 mg/dL   BUN 24 (H) 6 - 20 mg/dL   Creatinine, Ser 1.19 0.61 - 1.24 mg/dL   Calcium 9.0 8.9 - 10.3 mg/dL   Total Protein 6.9 6.5 - 8.1 g/dL   Albumin 3.8 3.5 - 5.0 g/dL   AST 23 15 - 41 U/L   ALT 10 (L) 17 - 63 U/L   Alkaline Phosphatase 81 38 - 126 U/L   Total Bilirubin 0.7 0.3 - 1.2 mg/dL   GFR calc non Af Amer 54 (L) >60 mL/min   GFR calc Af Amer >60 >60 mL/min   Anion gap 10 5 - 15  Troponin I  Result Value Ref Range   Troponin I 0.03 (HH) <0.03 ng/mL  CBC with Differential  Result Value Ref Range   WBC 5.3 3.8 - 10.6 K/uL   RBC 3.78 (L) 4.40 - 5.90 MIL/uL   Hemoglobin 11.7 (L) 13.0 - 18.0 g/dL   HCT 36.2 (L) 40.0 - 52.0 %   MCV 95.9 80.0 - 100.0 fL   MCH 31.1 26.0 - 34.0 pg   MCHC 32.4 32.0 - 36.0 g/dL   RDW 13.6 11.5 - 14.5 %   Platelets 212 150 - 440 K/uL   Neutrophils Relative % 60 %   Neutro Abs 3.2 1.4 - 6.5 K/uL   Lymphocytes Relative 22 %   Lymphs Abs 1.2 1.0 - 3.6 K/uL   Monocytes Relative 13 %   Monocytes Absolute 0.7 0.2 - 1.0 K/uL   Eosinophils Relative 4 %   Eosinophils Absolute 0.2 0 - 0.7 K/uL   Basophils Relative 1 %   Basophils Absolute 0.1 0 - 0.1 K/uL  Troponin I  Result Value Ref Range   Troponin I 0.03 (HH) <0.03 ng/mL      Assessment & Plan:   Problem List Items Addressed This Visit      Cardiovascular and Mediastinum   Hypertension    Not under good control. Frustrated today and has been walking quite a bit. Daughter has been monitoring and it was better at home. Seeing PCP in 1 month. Will increase his hydralazine to 35m TID and recheck in 1 month.       Relevant Medications   hydrALAZINE (APRESOLINE) 25 MG tablet   Other Relevant Orders   Comprehensive metabolic panel    Microalbumin, Urine Waived   TSH   CAD (coronary artery disease)    No chest pain. Stable. Continue to monitor.       Relevant Medications   hydrALAZINE (APRESOLINE) 25 MG tablet   Other Relevant Orders   Comprehensive metabolic panel   Lipid Panel w/o Chol/HDL Ratio   TSH     Digestive   GERD (gastroesophageal reflux disease)    Stable. Continue current regimen. Continue to monitor. Call with any concerns.       Relevant Orders   Comprehensive metabolic panel   TSH  Endocrine   Diabetes mellitus without complication ([place]) - Primary    Stable with A1c of 6.5. Continue current regimen. Continue to monitor. Call with any concerns. Needs diabetic shoes. Has hammer toes. Will fill out form and get him the shoes.      Relevant Orders   Bayer DCA Hb A1c Waived   Comprehensive metabolic panel   Lipid Panel w/o Chol/HDL Ratio   Microalbumin, Urine Waived   TSH     Other   Urinary incontinence    Rechecking urine today. Await results.       Relevant Orders   Comprehensive metabolic panel   TSH   UA/M w/rflx Culture, Routine   Aphasia    Will get back in to see speech therapy. Referral generated today. Call with any concerns.       Relevant Orders   Comprehensive metabolic panel   TSH   Ambulatory referral to Speech Therapy   History of stroke with residual deficit    Still with aphasia and weakness on the L side. Will get him in for outpatient PT and ST at the hospital. Orders placed today.      Relevant Orders   Comprehensive metabolic panel   TSH   Ambulatory referral to Physical Therapy   Ambulatory referral to Speech Therapy       Follow up plan: Return As scheduled with MAC.

## 2017-08-23 NOTE — Assessment & Plan Note (Signed)
Stable. Continue current regimen. Continue to monitor. Call with any concerns.  

## 2017-08-23 NOTE — Assessment & Plan Note (Signed)
Rechecking urine today. Await results.  

## 2017-08-23 NOTE — Assessment & Plan Note (Addendum)
Stable with A1c of 6.5. Continue current regimen. Continue to monitor. Call with any concerns. Needs diabetic shoes. Has hammer toes. Will fill out form and get him the shoes.

## 2017-08-23 NOTE — Assessment & Plan Note (Signed)
Still with aphasia and weakness on the L side. Will get him in for outpatient PT and ST at the hospital. Orders placed today.

## 2017-08-23 NOTE — Assessment & Plan Note (Signed)
No chest pain. Stable. Continue to monitor.

## 2017-08-24 ENCOUNTER — Ambulatory Visit: Payer: Self-pay | Admitting: *Deleted

## 2017-08-24 ENCOUNTER — Encounter: Payer: Self-pay | Admitting: Family Medicine

## 2017-08-24 LAB — LIPID PANEL W/O CHOL/HDL RATIO
Cholesterol, Total: 148 mg/dL (ref 100–199)
HDL: 40 mg/dL (ref >39)
LDL Calculated: 75 mg/dL (ref 0–99)
Triglycerides: 166 mg/dL — ABNORMAL HIGH (ref 0–149)
VLDL Cholesterol Cal: 33 mg/dL (ref 5–40)

## 2017-08-24 LAB — MICROALBUMIN, URINE WAIVED
Creatinine, Urine Waived: 200 mg/dL (ref 10–300)
Microalb, Ur Waived: 30 mg/L — ABNORMAL HIGH (ref 0–19)
Microalb/Creat Ratio: <30 mg/g (ref <30)

## 2017-08-24 LAB — UA/M W/RFLX CULTURE, ROUTINE
Bilirubin, UA: NEGATIVE
Ketones, UA: NEGATIVE
Leukocytes, UA: NEGATIVE
Nitrite, UA: NEGATIVE
Protein, UA: NEGATIVE
RBC, UA: NEGATIVE
Specific Gravity, UA: 1.015 (ref 1.005–1.030)
Urobilinogen, Ur: 1 mg/dL (ref 0.2–1.0)
pH, UA: 5.5 (ref 5.0–7.5)

## 2017-08-24 LAB — COMPREHENSIVE METABOLIC PANEL
ALT: 7 IU/L (ref 0–44)
AST: 15 IU/L (ref 0–40)
Albumin/Globulin Ratio: 1.6 (ref 1.2–2.2)
Albumin: 4.4 g/dL (ref 3.5–4.7)
Alkaline Phosphatase: 88 IU/L (ref 39–117)
BUN/Creatinine Ratio: 25 — ABNORMAL HIGH (ref 10–24)
BUN: 26 mg/dL (ref 8–27)
Bilirubin Total: 0.4 mg/dL (ref 0.0–1.2)
CO2: 25 mmol/L (ref 20–29)
Calcium: 9.4 mg/dL (ref 8.6–10.2)
Chloride: 102 mmol/L (ref 96–106)
Creatinine, Ser: 1.06 mg/dL (ref 0.76–1.27)
GFR calc Af Amer: 74 mL/min/{1.73_m2} (ref >59)
GFR calc non Af Amer: 64 mL/min/{1.73_m2} (ref >59)
Globulin, Total: 2.7 g/dL (ref 1.5–4.5)
Glucose: 212 mg/dL — ABNORMAL HIGH (ref 65–99)
Potassium: 4.7 mmol/L (ref 3.5–5.2)
Sodium: 142 mmol/L (ref 134–144)
Total Protein: 7.1 g/dL (ref 6.0–8.5)

## 2017-08-24 LAB — BAYER DCA HB A1C WAIVED: HB A1C (BAYER DCA - WAIVED): 6.5 % (ref <7.0)

## 2017-08-24 LAB — TSH: TSH: 0.677 u[IU]/mL (ref 0.450–4.500)

## 2017-08-24 NOTE — Telephone Encounter (Signed)
Pt's daughter calling asking for the previous dosage of Hydralyzine (Apresoline) that was prescribed. Notified pt's daughter that he was previously prescribed 10 mg three times a day and the dosage has now been increased to 25 mg three times a day. Pt's daughter verbalized understanding.    Reason for Disposition . General information question, no triage required and triager able to answer question  Answer Assessment - Initial Assessment Questions 1. REASON FOR CALL or QUESTION: "What is your reason for calling today?" or "How can I best help you?" or "What question do you have that I can help answer?"     Pt's daughter calling asking for the previous dosage of Hydralyzine (Apresoline) that was prescribed. Notified pt's daughter that he was previously prescribed 10 mg three times a day and the dosage has now been increased to 25 mg three times a day. Pt's daughter verbalized understanding.  Protocols used: INFORMATION ONLY CALL-A-AH

## 2017-08-27 ENCOUNTER — Telehealth: Payer: Self-pay | Admitting: Cardiovascular Disease

## 2017-08-27 NOTE — Telephone Encounter (Signed)
Margaretha Sheffield from [facility] is calling Patient daughter [name] had called Dr. Maree Krabbe office to discuss blood pressure medication Dr. Rockey Situ had prescribed the medication so was unable to clarify infomation Asked if we could call [name] to discuss [phone]

## 2017-08-27 NOTE — Telephone Encounter (Signed)
No answer. Left message to call back.   

## 2017-08-28 ENCOUNTER — Ambulatory Visit: Payer: Self-pay | Admitting: *Deleted

## 2017-08-28 NOTE — Telephone Encounter (Signed)
Pt daughter Ardeen Fillers asked when we call back to call her back

## 2017-08-28 NOTE — Telephone Encounter (Signed)
Pt states the fax is still not the correct orders she needs. She does not need home health speech therapy, she needs outpatient speech therapy orders. She states this has been faxed over 3 times and she keeps getting back the wrong orders each time.

## 2017-08-28 NOTE — Telephone Encounter (Signed)
Please excuse error in previous message. Pt did not state, Amy with [facility] stated in regards to fax for orders.

## 2017-08-28 NOTE — Telephone Encounter (Signed)
S/w daughter, Ardeen Fillers, ok per DPR. They need to review patient's medication list to make sure its correct. She asked me to call her dad and speak to [place], daughter-in-law, that patient lives with.  Called and got verbal permission to speak with [name] from patient. [name] read off every medication and dosage and how he takes it. I verified his medications and they were all correct. She did not have any further questions and stated "I feel good about giving him his medicine."

## 2017-08-28 NOTE — Telephone Encounter (Signed)
Pt daughter is returning your call.  She states some of pt medications has been changed and would like to discuss.

## 2017-08-28 NOTE — Telephone Encounter (Signed)
Spoke with Amy. Was refaxed. She will check when she gets back in office to ensure she has received. Will call to let us know.

## 2017-08-29 ENCOUNTER — Ambulatory Visit: Payer: PPO | Attending: Family Medicine | Admitting: Speech Pathology

## 2017-08-29 DIAGNOSIS — I69190 Apraxia following nontraumatic intracerebral hemorrhage: Secondary | ICD-10-CM

## 2017-08-29 DIAGNOSIS — R4701 Aphasia: Secondary | ICD-10-CM | POA: Insufficient documentation

## 2017-08-30 ENCOUNTER — Encounter: Payer: Self-pay | Admitting: Speech Pathology

## 2017-08-30 ENCOUNTER — Other Ambulatory Visit: Payer: Self-pay

## 2017-08-30 ENCOUNTER — Other Ambulatory Visit: Payer: Self-pay | Admitting: *Deleted

## 2017-08-30 NOTE — Therapy (Signed)
[place] MAIN [facility] SERVICES [address] [place], [place], [address] Phone: [phone]   Fax:  [phone]  Speech Language Pathology Evaluation  Patient Details  Name: Todd [name] MRN: [id number] Date of Birth: 08-Apr-1931 Referring Provider: Valerie Roys    Encounter Date: 08/29/2017  End of Session - 08/30/17 1416    Visit Number  1    Number of Visits  17    Date for SLP Re-Evaluation  11/02/17    SLP Start Time  53    SLP Stop Time   1700    SLP Time Calculation (min)  60 min    Activity Tolerance  Patient tolerated treatment well       Past Medical History:  Diagnosis Date  . Anxiety   . Aortic regurgitation    a. 03/2017 Echo: Mod AI; b. 05/2017 Echo: Triv AI.  [name] Kitchen Arthritis    osteoarthritis-left knee  . CAD (coronary artery disease)    a. Reported h/o cath ~ 2008 - ? small vessel dzs. No PCI performed; b. 03/2017 MV: EF 45-54%, no ischemia/infarct.  . Depression   . Diabetes mellitus without complication ([place])   . Diastolic dysfunction    a. 03/2017 Echo: EF 55-65%; b. 05/2017 Echo: EF 55-60%, no rwma, Gr1 DD. Triv AI.  Mildly dil LA.  . ED (erectile dysfunction)   . Elevated troponin    a. chronic mild elevation - 0.03->0.04.  [name] Kitchen GERD (gastroesophageal reflux disease)   . Glaucoma   . Hyperlipidemia   . Hypertension   . Iron deficiency anemia due to chronic blood loss   . Left pontine stroke w/ cerebrovascular disease(HCC)    a. 05/2017 MRI/A: acute to subacute L pontine infarct. Chronic left thalamic lacunar infarct. Severe basilar artery stenosis w/ radiographic string sign of the distal 1/3. Mild to moderate L PCA P2 segment stenosis; b. 05/2017 Carotid U/S: <50% bilat ICA stenosis.  . Lumbago    Lumbosacral Neuritis  . Prostate cancer [facility])     Past Surgical History:  Procedure Laterality Date  . CARPAL TUNNEL RELEASE    . REPLACEMENT TOTAL KNEE BILATERAL      There were no vitals filed for this  visit.      SLP Evaluation [facility] - 08/30/17 0001      SLP Visit Information   SLP Received On  08/29/17    Referring Provider  Wynetta Emery, [place] P     Onset Date  05/17/2018      Subjective   Subjective  Patient is eager to continue speech therapy    Patient/Family Stated Goal  Maximize speech and communication      General Information   HPI  84 year old man S/P left pontine stroke 05/17/2017.  The patient received rehab services, including speech therapy, at a SNF (05/21/2017 - 06/22/2017) and via home health (06/22/2017 - 08/09/2017).  Past medical history includes vascular dementia.      Prior Functional Status   Cognitive/Linguistic Baseline  Baseline deficits      Auditory Comprehension   Overall Auditory Comprehension  Impaired    Yes/No Questions  Impaired    Complex Questions  50-74% accurate    Commands  Impaired    Multistep Basic Commands  50-74% accurate    Complex Commands  0-24% accurate    Conversation  Simple      Verbal Expression   Overall Verbal Expression  Impaired      Oral Motor/Sensory Function  Overall Oral Motor/Sensory Function  Appears within functional limits for tasks assessed      Motor Speech   Overall Motor Speech  Impaired    Respiration  Within functional limits    Phonation  Normal    Resonance  Within functional limits    Articulation  Impaired    Level of Impairment  Word    Intelligibility  Intelligibility reduced    Motor Planning  Impaired    Level of Impairment  Phrase    Motor Speech Errors  Aware;Groping for words;Inconsistent    Phonation  WFL      Standardized Assessments   Standardized Assessments   Western Aphasia Battery revised        Western Aphasia Battery- Revised   Spontaneous Speech      Information content  5/10       Fluency   5/10      Comprehension     Yes/No questions  57/60- responses are ambiguous, but recognizable       Auditory Word Recognition 58/60        Sequential Commands 39/80- unable to  complete complex/multi-unit     Repetition   37/100      Naming    Object Naming  45/60        Word Fluency   5/20        Sentence Completion 8/10        Responsive Speech   8/10      Aphasia Quotient  56/100   SLP Education - 08/30/17 1415    Education provided  Yes    Education Details  Plan of care    Person(s) Educated  Patient    Methods  Explanation    Comprehension  Verbalized understanding         SLP Long Term Goals - 08/30/17 1419      SLP LONG TERM GOAL #1   Title  Patient will read aloud or repeat words/phrases/sentences, maintaining phonemic accuracy, with 80% accuracy/fluency.      Time  8    Period  Weeks    Status  New    Target Date  11/02/17      SLP LONG TERM GOAL #2   Title  Patient will complete semantic feature word finding tasks with 80% accuracy.    Time  8    Period  Weeks    Status  New    Target Date  11/02/17      SLP LONG TERM GOAL #3   Title  Patient will generate grammatical and cogent phrases to complete simple/concrete linguistic task with 80% accuracy.    Time  8    Period  Weeks    Status  New    Target Date  11/02/17      SLP LONG TERM GOAL #4   Title  Patient will complete multi-unit processing tasks with 80% accuracy without the need of repetition of task instructions or significant delays in responding.    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  The patient will benefit from continued skilled speech therapy for restorative and compensatory treatment of aphasia and apraxia.     Speech Therapy Frequency  2x / week    Duration  Other (comment) 8 weeks    Treatment/Interventions  Language facilitation;Cueing hierarchy;SLP instruction and feedback;Multimodal communcation approach;Patient/family education    Potential to Achieve Goals  Fair    Potential Considerations  Ability to learn/carryover information;Pain level;Family/community support;Co-morbidities;Previous level of function;Cooperation/participation level;Severity of impairments;Medical prognosis    SLP Home Exercise Plan  To be determined    Consulted and Agree with Plan of Care  Patient       Patient will benefit from skilled therapeutic intervention in order to improve the following deficits and impairments:   Aphasia - Plan: SLP plan of care cert/re-cert  Apraxia following nontraumatic intracerebral hemorrhage - Plan: SLP plan of care cert/re-cert    Problem List Patient Active Problem List   Diagnosis Date Noted  . Aphasia 05/18/2017  . CAD (coronary artery disease) 05/18/2017  . History of stroke with residual deficit 05/18/2017  . 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 Urinary incontinence 04/27/2015  . Anxiety, generalized 04/07/2015  . History of BCG vaccination 04/07/2015  . GERD (gastroesophageal reflux disease) 03/04/2015  . Depression 02/25/2015  . Degeneration of intervertebral disc of lumbar region 02/23/2015  . Neuritis or radiculitis due to rupture of lumbar intervertebral disc 02/23/2015  . Lumbar canal stenosis 02/23/2015  . Hypertension 12/02/2014  . Diabetes mellitus without complication ([place]) 91/63/8466  . Benign essential tremor 03/13/2014  . Imbalance 03/13/2014  . Malignant neoplasm of prostate ([place]) 10/07/2012  . ED (erectile dysfunction) of organic origin 02/09/2012   Leroy Sea, MS/CCC- SLP  Lou Miner 08/30/2017, 2:25 PM  [place] MAIN [facility] SERVICES [address] [place], [place],  [address] Phone: [phone]   Fax:  [phone]  Name: Todd [name] MRN: [id number] Date of Birth: 06-24-1930

## 2017-08-30 NOTE — Patient Outreach (Signed)
[place] [facility]) Care Management  08/30/2017  Todd [name] 01/05/1931 [id number]   Telephone follow up call  Unsuccessful outreach call to patient daughter [name], able to leave a HIPAA compliant voice message for return call.  Plan Will await return call, if no response will plan return call in the next 2 weeks and assess for further care management care coordination needs to be addressed and consider case closure.    [name], RN, [place] Management Coordinator  [phone]- Mobile [phone]- Toll Free Main Office

## 2017-08-31 ENCOUNTER — Ambulatory Visit: Payer: PPO | Admitting: Speech Pathology

## 2017-09-04 ENCOUNTER — Other Ambulatory Visit: Payer: Self-pay

## 2017-09-05 ENCOUNTER — Other Ambulatory Visit: Payer: Self-pay | Admitting: *Deleted

## 2017-09-05 NOTE — Patient Outreach (Signed)
[facility]) Care Management  09/05/2017  Todd [name] May 27, 1931 [id number]   Phone call to patient's daughter Todd [name] who has confirmed that [place] has installed the grab bars in patient's bathroom. Per Todd [name], patient is progressing well. He with start outpatient PT and speech this month. Patient's daughter has verbalized having no additional community resource needs at this time. Patient to be closed to [facility] social work.   [name] [facility] Care Management [phone]

## 2017-09-07 ENCOUNTER — Ambulatory Visit: Payer: PPO | Attending: Family Medicine | Admitting: Speech Pathology

## 2017-09-07 DIAGNOSIS — R2681 Unsteadiness on feet: Secondary | ICD-10-CM | POA: Insufficient documentation

## 2017-09-07 DIAGNOSIS — M6281 Muscle weakness (generalized): Secondary | ICD-10-CM | POA: Insufficient documentation

## 2017-09-07 DIAGNOSIS — I69190 Apraxia following nontraumatic intracerebral hemorrhage: Secondary | ICD-10-CM | POA: Insufficient documentation

## 2017-09-07 DIAGNOSIS — R4701 Aphasia: Secondary | ICD-10-CM | POA: Insufficient documentation

## 2017-09-11 ENCOUNTER — Ambulatory Visit: Payer: Self-pay | Admitting: Family Medicine

## 2017-09-12 ENCOUNTER — Other Ambulatory Visit: Payer: PPO

## 2017-09-12 DIAGNOSIS — C61 Malignant neoplasm of prostate: Secondary | ICD-10-CM

## 2017-09-13 ENCOUNTER — Other Ambulatory Visit: Payer: Self-pay | Admitting: *Deleted

## 2017-09-13 ENCOUNTER — Encounter: Payer: Self-pay | Admitting: *Deleted

## 2017-09-13 LAB — PSA: Prostate Specific Ag, Serum: 2 ng/mL (ref 0.0–4.0)

## 2017-09-13 LAB — TESTOSTERONE: Testosterone: 9 ng/dL — ABNORMAL LOW (ref 264–916)

## 2017-09-13 NOTE — Patient Outreach (Signed)
[facility]) Care Management  09/13/2017  Todd [name] 1930-12-31 [id number]  Case Closure    84 yo male recent admission to [facility] on 12/13, Dx: CVA, discharged on 12/17 to [facility] SN[facility] Discharged from [facility] on 06/12/17 PMHx : Diabetes, CVA, Aphasia, Hypertension, CAD, Anxiety,Hyperlipedemia  Successful outreach call to patient daughter Todd [name], HIPAA verified.  Daughter reports patient is getting along alright she discussed he is attending outpatient speech therapy twice weekly , she discussed family, daughters and son providing transportation.   Patient daughter discussed patient is continuing to progress with mobility in home using rolling walker, he now has a medical alert system in place.  Patient daughter in law is continuing to monitor blood pressure readings daily and keep a record she is unable to recall recent readings. Family continues to assist patient with daily medications.  Daughter discussed they have it worked out for patient to get diabetic shoes.   Patient daughter denies any new concerns at this time, Discussed goal progress, agreeable goals met and no new needs expressed and agreeable to case closure at this time. Verified family has contact number if further [facility] needs arise.   Plan Will close case goals have been met. Will send MD case closure letter.    [name], RN, [place] Management Coordinator  [phone]- Mobile [phone]- Toll Free Main Office

## 2017-09-20 ENCOUNTER — Telehealth: Payer: Self-pay | Admitting: Cardiovascular Disease

## 2017-09-20 NOTE — Telephone Encounter (Signed)
Called and s/w patient's daughter ok per DPR. Patient has been experiencing increased shortness of breath with exertion and fatigue. He is having trouble walking to the mailbox and it unable to do the normal activities. Patient has some lower extremity swelling to report as well. She agreed to appointment with Dr Rockey Situ tomorrow at 11:40 am.

## 2017-09-20 NOTE — Telephone Encounter (Signed)
Pt c/o Shortness Of Breath: STAT if SOB developed within the last 24 hours or pt is noticeably SOB on the phone  1. Are you currently SOB (can you hear that pt is SOB on the phone)? Pt wife is not with him as of now  2. How long have you been experiencing SOB? A month  3. Are you SOB when sitting or when up moving around? Moving around  4. Are you currently experiencing any other symptoms? Extremely tired,

## 2017-09-21 ENCOUNTER — Ambulatory Visit: Payer: Self-pay

## 2017-09-21 ENCOUNTER — Encounter: Payer: Self-pay | Admitting: Cardiovascular Disease

## 2017-09-21 ENCOUNTER — Ambulatory Visit (INDEPENDENT_AMBULATORY_CARE_PROVIDER_SITE_OTHER): Payer: PPO | Admitting: Cardiovascular Disease

## 2017-09-21 ENCOUNTER — Ambulatory Visit: Payer: PPO | Admitting: Speech Pathology

## 2017-09-21 ENCOUNTER — Encounter: Payer: Self-pay | Admitting: Speech Pathology

## 2017-09-21 VITALS — BP 138/63 | HR 73 | Ht 67.5 in | Wt 192.0 lb

## 2017-09-21 DIAGNOSIS — I6930 Unspecified sequelae of cerebral infarction: Secondary | ICD-10-CM | POA: Diagnosis not present

## 2017-09-21 DIAGNOSIS — R4701 Aphasia: Secondary | ICD-10-CM | POA: Diagnosis not present

## 2017-09-21 DIAGNOSIS — E782 Mixed hyperlipidemia: Secondary | ICD-10-CM | POA: Diagnosis not present

## 2017-09-21 DIAGNOSIS — I10 Essential (primary) hypertension: Secondary | ICD-10-CM

## 2017-09-21 DIAGNOSIS — R2681 Unsteadiness on feet: Secondary | ICD-10-CM | POA: Diagnosis not present

## 2017-09-21 DIAGNOSIS — E119 Type 2 diabetes mellitus without complications: Secondary | ICD-10-CM | POA: Diagnosis not present

## 2017-09-21 DIAGNOSIS — C61 Malignant neoplasm of prostate: Secondary | ICD-10-CM | POA: Diagnosis not present

## 2017-09-21 DIAGNOSIS — I69190 Apraxia following nontraumatic intracerebral hemorrhage: Secondary | ICD-10-CM | POA: Diagnosis not present

## 2017-09-21 DIAGNOSIS — I2510 Atherosclerotic heart disease of native coronary artery without angina pectoris: Secondary | ICD-10-CM | POA: Diagnosis not present

## 2017-09-21 DIAGNOSIS — M6281 Muscle weakness (generalized): Secondary | ICD-10-CM | POA: Diagnosis not present

## 2017-09-21 NOTE — Patient Instructions (Signed)

## 2017-09-21 NOTE — Progress Notes (Signed)
Cardiology Office Note  Date:  09/21/2017   ID:  Todd [name], DOB 09-Nov-1930, MRN [id number]  PCP:  Todd Maple, MD   Chief Complaint  Patient presents with  . other    Sob and fatigue. Meds reviewed verbally with pt.    HPI:  [name] is a 84 year old gentleman with past medical history of Coronary artery disease by catheterization 8-10 years ago Diabetes Hypertension Tremor GERD Hospitalization October 2018 for chest pain Hospitalization December 2018 for TIA Who presents for follow-up of his chest pain and TIA symptoms  Family presents with him today He is relatively nonverbal, most of the history provided by family Presents in a wheelchair TIA 05/2017 Hospital records reviewed with the patient in detail Speech, difficulty getting his words out sometimes Echo and carotids showed nothing acute LDL 67,  on lipitor Seen by neurology  on ASA, Plavix and statin Skilled nursing facility, then had home PT and speech  Family reports he has very poor energy, difficult getting him up to do anything Always tired, fatigued Chronic lower extremity swelling, sits with his feet down most of the day Sometimes using his walker at home Denies any significant chest pain  Lab work reviewed in detail, testosterone of 9 PSA stable  Shows normal sinus rhythm rate 73 bpmEKG personally reviewed by myself on todays visit Poor R wave progression through the anterior precordial leads left anterior fascicular block, PVCs  Imaging reviewed in detail Carotid <39% b/l  Echo 05/2017 Left ventricle: The cavity size was normal. There was moderate   concentric hypertrophy. Systolic function was normal. The   estimated ejection fraction was in the range of 55% to 60%. Wall   motion was normal; there were no regional wall motion   abnormalities. Doppler parameters are consistent with abnormal   left ventricular relaxation (grade 1 diastolic dysfunction). - Aortic valve: There was  trivial regurgitation. - Mitral valve: Calcified annulus. - Left atrium: The atrium was mildly dilated.  The past medical history reviewed  hospital March 17, 2017 with chest pain. Reported having central chest pain with no radiation He had echocardiogram and stress test in the hospital Blood pressure markedly elevated in the hospital  Pharmacologic Myoview March 17, 2017 Results reviewed with him in detail today Blood pressure demonstrated a normal response to exercise.  There was no ST segment deviation noted during stress.  The left ventricular ejection fraction is mildly decreased (45-54%).  The study is normal.  This is a low risk study.     PMH:   has a past medical history of Anxiety, Aortic regurgitation, Arthritis, CAD (coronary artery disease), Depression, Diabetes mellitus without complication ([place]), Diastolic dysfunction, ED (erectile dysfunction), Elevated troponin, GERD (gastroesophageal reflux disease), Glaucoma, Hyperlipidemia, Hypertension, Iron deficiency anemia due to chronic blood loss, Left pontine stroke w/ cerebrovascular disease(HCC), Lumbago, and Prostate cancer ([place]).  PSH:    Past Surgical History:  Procedure Laterality Date  . CARPAL TUNNEL RELEASE    . REPLACEMENT TOTAL KNEE BILATERAL      Current Outpatient Medications  Medication Sig Dispense Refill  . acetaminophen (TYLENOL 8 HOUR ARTHRITIS PAIN) 650 MG CR tablet Take 650 mg by mouth every 8 (eight) hours as needed for pain.    [name] Kitchen aspirin EC 81 MG tablet Take 81 mg by mouth daily.    [name] Kitchen atorvastatin (LIPITOR) 10 MG tablet Take 1 tablet (10 mg total) by mouth daily. 90 tablet 3  . Blood Glucose Monitoring Suppl (ACCU-CHEK AVIVA PLUS) w/Device  KIT Use to check sugar levels x 2 daily 1 kit 0  . busPIRone (BUSPAR) 10 MG tablet Take 10 mg by mouth at bedtime.     . clonazePAM (KLONOPIN) 0.5 MG tablet Take 1 tablet (0.5 mg total) by mouth daily as needed for anxiety. 10 tablet 0  . clopidogrel (PLAVIX)  75 MG tablet Take 1 tablet (75 mg total) by mouth daily. 90 tablet 3  . DULoxetine (CYMBALTA) 20 MG capsule Take 1 capsule (20 mg total) by mouth daily. (Patient taking differently: Take 20 mg by mouth at bedtime. ) 90 capsule 4  . gabapentin (NEURONTIN) 100 MG capsule Take 100 mg by mouth 2 (two) times daily.     . hydrALAZINE (APRESOLINE) 25 MG tablet Take 1 tablet (25 mg total) by mouth 3 (three) times daily. 90 tablet 3  . hydrochlorothiazide (HYDRODIURIL) 25 MG tablet Take 1 tablet (25 mg total) by mouth daily. 30 tablet 3  . Lancets (ACCU-CHEK SOFT TOUCH) lancets Use as instructed 100 each 12  . latanoprost (XALATAN) 0.005 % ophthalmic solution Place 1 drop into both eyes at bedtime.     . metFORMIN (GLUCOPHAGE) 500 MG tablet Take 1 tablet (500 mg total) by mouth daily with breakfast. 180 tablet 4  . naproxen sodium (ALEVE) 220 MG tablet Take 220 mg by mouth 2 (two) times daily as needed.     . pantoprazole (PROTONIX) 40 MG tablet Take 1 tablet (40 mg total) by mouth daily. 90 tablet 3  . propranolol ER (INDERAL LA) 80 MG 24 hr capsule Take 1 capsule (80 mg total) by mouth daily. 90 capsule 4  . telmisartan (MICARDIS) 80 MG tablet Take 1 tablet (80 mg total) by mouth daily. 90 tablet 4   No current facility-administered medications for this visit.      Allergies:   Amlodipine; Fluoxetine; and Meloxicam   Social History:  The patient  reports that he quit smoking about 22 years ago. He has never used smokeless tobacco. He reports that he drinks about 0.6 oz of alcohol per week. He reports that he does not use drugs.   Family History:   family history includes Hypertension in his father and mother; Stroke in his mother and sister.    Review of Systems: Review of Systems  Constitutional: Negative.   Respiratory: Negative.   Cardiovascular: Negative.   Gastrointestinal: Negative.   Musculoskeletal: Negative.   Neurological: Negative.   Psychiatric/Behavioral: Negative.   All other  systems reviewed and are negative.    PHYSICAL EXAM: VS:  BP 138/63 (BP Location: Left Arm, Patient Position: Sitting, Cuff Size: Normal)   Pulse 73   Ht 5' 7.5" (1.715 m)   Wt 192 lb (87.1 kg)   BMI 29.63 kg/m  , BMI Body mass index is 29.63 kg/m. GEN: Well nourished, well developed, in no acute distress  HEENT: normal  Neck: no JVD, carotid bruits, or masses Cardiac: RRR; no murmurs, rubs, or gallops,no edema  Respiratory:  clear to auscultation bilaterally, normal work of breathing GI: soft, nontender, nondistended, + BS MS: no deformity or atrophy  Skin: warm and dry, no rash Neuro:  Strength and sensation are intact Psych: euthymic mood, full affect    Recent Labs: 07/16/2017: Hemoglobin 11.7; Platelets 212 08/23/2017: ALT 7; BUN 26; Creatinine, Ser 1.06; Potassium 4.7; Sodium 142; TSH 0.677    Lipid Panel Lab Results  Component Value Date   CHOL 148 08/23/2017   HDL 40 08/23/2017   LDLCALC 75 08/23/2017  TRIG 166 (H) 08/23/2017      Wt Readings from Last 3 Encounters:  09/21/17 192 lb (87.1 kg)  08/23/17 188 lb 3 oz (85.4 kg)  07/24/17 182 lb 4 oz (82.7 kg)       ASSESSMENT AND PLAN:  Chest pain, unspecified type - Plan: EKG 12-Lead Previously with atypical chest pain possibly secondary to GERD Negative stress test, echocardiogram  No further testing has been ordered, stable on today's visit  Essential hypertension Long discussion with family, No clear signs of side effects from his medications, blood pressure well controlled We will continue current medications on his list Overall appears stable  Diabetes mellitus without complication (HCC) No regular exercise program Hemoglobin A1c well-controlled over the past several years recently 6.5 high end of his range  Imbalance Presenting with wheelchair, gait instability at baseline Previously did rehab and was doing better Week, no energy recently Recommended he restart his walking program but he  is not motivated  Anxiety Family reported that anxiety has been a major issue in the past  on BuSpar  Fatigue No energy Etiology unclear, uncertain if this is related to low testosterone He does have a history of prostate cancer Recommended he talk with [name],  even consider talking with Dr. Bernardo Heater concerning various treatment options for low testosterone.  He may not be a candidate given history of prostate cancer   Total encounter time more than 45 minutes  Greater than 50% was spent in counseling and coordination of care with the patient    Orders Placed This Encounter  Procedures  . EKG 12-Lead     Signed, Esmond Plants, M.D., Ph.D. 09/21/2017  [place], [place]

## 2017-09-21 NOTE — Therapy (Signed)
[place] MAIN [facility] SERVICES [address] [place], [place], [address] Phone: [phone]   Fax:  [phone]  Speech Language Pathology Treatment  Patient Details  Name: Todd [name] MRN: [id number] Date of Birth: February 16, 1931 Referring Provider: Valerie Roys    Encounter Date: 09/21/2017  End of Session - 09/21/17 1613    Visit Number  2    Number of Visits  17    Date for SLP Re-Evaluation  11/02/17    SLP Start Time  1500    SLP Stop Time   7106    SLP Time Calculation (min)  53 min    Activity Tolerance  Patient tolerated treatment well       Past Medical History:  Diagnosis Date  . Anxiety   . Aortic regurgitation    a. 03/2017 Echo: Mod AI; b. 05/2017 Echo: Triv AI.  [name] Kitchen Arthritis    osteoarthritis-left knee  . CAD (coronary artery disease)    a. Reported h/o cath ~ 2008 - ? small vessel dzs. No PCI performed; b. 03/2017 MV: EF 45-54%, no ischemia/infarct.  . Depression   . Diabetes mellitus without complication ([place])   . Diastolic dysfunction    a. 03/2017 Echo: EF 55-65%; b. 05/2017 Echo: EF 55-60%, no rwma, Gr1 DD. Triv AI.  Mildly dil LA.  . ED (erectile dysfunction)   . Elevated troponin    a. chronic mild elevation - 0.03->0.04.  [name] Kitchen GERD (gastroesophageal reflux disease)   . Glaucoma   . Hyperlipidemia   . Hypertension   . Iron deficiency anemia due to chronic blood loss   . Left pontine stroke w/ cerebrovascular disease(HCC)    a. 05/2017 MRI/A: acute to subacute L pontine infarct. Chronic left thalamic lacunar infarct. Severe basilar artery stenosis w/ radiographic string sign of the distal 1/3. Mild to moderate L PCA P2 segment stenosis; b. 05/2017 Carotid U/S: <50% bilat ICA stenosis.  . Lumbago    Lumbosacral Neuritis  . Prostate cancer [facility])     Past Surgical History:  Procedure Laterality Date  . CARPAL TUNNEL RELEASE    . REPLACEMENT TOTAL KNEE BILATERAL      There were no vitals filed for this  visit.  Subjective Assessment - 09/21/17 1611    Subjective  Patient is very frustrated with his speech            ADULT SLP TREATMENT - 09/21/17 0001      General Information   Behavior/Cognition  Alert;Cooperative;Pleasant mood;Impulsive    HPI   84 year old man S/P left pontine stroke 05/17/2017.  The patient received rehab services, including speech therapy, at a SNF (05/21/2017 - 06/22/2017) and via home health (06/22/2017 - 08/09/2017).  Past medical history includes vascular dementia.       Treatment Provided   Treatment provided  Cognitive-Linquistic      Pain Assessment   Pain Assessment  No/denies pain      Cognitive-Linquistic Treatment   Treatment focused on  Aphasia;Apraxia    Skilled Treatment  Name pictured objects with 75% accuracy and answer related questions RE: picture with 75% accuracy.  Errors include phonemic paraphasia, perseveration, and blockage.  Patient able to relate simple story with min assist to clarify, but could not relate a complete complex memory from his past.      Assessment / Recommendations / Plan   Plan  Continue with current plan of care      Progression Toward Goals   Progression  toward goals  Progressing toward goals       SLP Education - 09/21/17 1611    Education provided  Yes    Education Details  Patient is working too hard, speech comes easier when we are more relaxed    Person(s) Educated  Patient    Methods  Explanation    Comprehension  Verbalized understanding;Need further instruction         SLP Long Term Goals - 08/30/17 1419      SLP LONG TERM GOAL #1   Title  Patient will read aloud or repeat words/phrases/sentences, maintaining phonemic accuracy, with 80% accuracy/fluency.      Time  8    Period  Weeks    Status  New    Target Date  11/02/17      SLP LONG TERM GOAL #2   Title  Patient will complete semantic feature word finding tasks with 80% accuracy.    Time  8    Period  Weeks    Status  New    Target  Date  11/02/17      SLP LONG TERM GOAL #3   Title  Patient will generate grammatical and cogent phrases to complete simple/concrete linguistic task with 80% accuracy.    Time  8    Period  Weeks    Status  New    Target Date  11/02/17      SLP LONG TERM GOAL #4   Title  Patient will complete multi-unit processing tasks with 80% accuracy without the need of repetition of task instructions or significant delays in responding.    Time  8    Period  Weeks    Status  New    Target Date  11/02/17       Plan - 09/21/17 1614    Clinical Impression Statement  The patient is struggling with the apraxia and perseveration.  He is responsive to cues that help him calm and respond to cues given.    Speech Therapy Frequency  2x / week    Duration  Other (comment)    Treatment/Interventions  Language facilitation;Cueing hierarchy;SLP instruction and feedback;Multimodal communcation approach;Patient/family education    Potential to Achieve Goals  Fair    Potential Considerations  Ability to learn/carryover information;Pain level;Family/community support;Co-morbidities;Previous level of function;Cooperation/participation level;Severity of impairments;Medical prognosis    SLP Home Exercise Plan  To be determined    Consulted and Agree with Plan of Care  Patient       Patient will benefit from skilled therapeutic intervention in order to improve the following deficits and impairments:   Aphasia  Apraxia following nontraumatic intracerebral hemorrhage    Problem List Patient Active Problem List   Diagnosis Date Noted  . Mixed hyperlipidemia 09/21/2017  . Aphasia 05/18/2017  . CAD (coronary artery disease) 05/18/2017  . History of stroke with residual deficit 05/18/2017  . 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 Urinary incontinence 04/27/2015  . Anxiety, generalized 04/07/2015  . History of BCG vaccination 04/07/2015  . GERD (gastroesophageal reflux disease) 03/04/2015  .  Depression 02/25/2015  . Degeneration of intervertebral disc of lumbar region 02/23/2015  . Neuritis or radiculitis due to rupture of lumbar intervertebral disc 02/23/2015  . Lumbar canal stenosis 02/23/2015  . Hypertension 12/02/2014  . Diabetes mellitus without complication ([place]) 32/67/1245  . Benign essential tremor 03/13/2014  . Imbalance 03/13/2014  . Malignant neoplasm of prostate ([place]) 10/07/2012  . ED (erectile dysfunction) of organic origin 02/09/2012  Leroy Sea, MS/CCC- SLP  Lou Miner 09/21/2017, 4:15 PM  [place] MAIN [facility] SERVICES [address] [place], [place], [address] Phone: [phone]   Fax:  [phone]   Name: Todd [name] MRN: [id number] Date of Birth: 1930/12/17

## 2017-09-24 ENCOUNTER — Telehealth: Payer: Self-pay | Admitting: Family Medicine

## 2017-09-24 DIAGNOSIS — R7989 Other specified abnormal findings of blood chemistry: Secondary | ICD-10-CM

## 2017-09-24 NOTE — Telephone Encounter (Signed)
Left message on machine for pt to return call to the office.  

## 2017-09-24 NOTE — Telephone Encounter (Signed)
Copied from [place] [id number]. Topic: Quick Communication - See Telephone Encounter >> Sep 24, 2017  9:03 AM [name], NT wrote: CRM for notification. See Telephone encounter for: 09/24/17.  Pt daughter [name] calling with questions for [name] about [employer]. Pt. Has an appt. But he needs to be seen sooner. [name] can be reached at [phone] [facility] [phone])

## 2017-09-24 NOTE — Telephone Encounter (Signed)
Pts daughter returning Todd [name]'s call. Please return call

## 2017-09-24 NOTE — Telephone Encounter (Signed)
Patient was transferred to provider for telephone conversation.   

## 2017-09-24 NOTE — Telephone Encounter (Signed)
I did call [facility]. They cannot get patient in sooner, currently scheduled for May 6th

## 2017-09-26 ENCOUNTER — Ambulatory Visit (INDEPENDENT_AMBULATORY_CARE_PROVIDER_SITE_OTHER): Payer: PPO | Admitting: Family Medicine

## 2017-09-26 ENCOUNTER — Encounter: Payer: Self-pay | Admitting: Family Medicine

## 2017-09-26 ENCOUNTER — Ambulatory Visit: Payer: Self-pay | Admitting: Family Medicine

## 2017-09-26 VITALS — BP 161/78 | HR 74 | Wt 189.0 lb

## 2017-09-26 DIAGNOSIS — E782 Mixed hyperlipidemia: Secondary | ICD-10-CM

## 2017-09-26 DIAGNOSIS — F411 Generalized anxiety disorder: Secondary | ICD-10-CM | POA: Diagnosis not present

## 2017-09-26 DIAGNOSIS — I639 Cerebral infarction, unspecified: Secondary | ICD-10-CM

## 2017-09-26 DIAGNOSIS — I10 Essential (primary) hypertension: Secondary | ICD-10-CM

## 2017-09-26 LAB — LP+ALT+AST PICCOLO, WAIVED
ALT (SGPT) Piccolo, Waived: 19 U/L (ref 10–47)
AST (SGOT) Piccolo, Waived: 27 U/L (ref 11–38)
Chol/HDL Ratio Piccolo,Waive: 3.4 mg/dL
Cholesterol Piccolo, Waived: 156 mg/dL (ref <200)
HDL Chol Piccolo, Waived: 46 mg/dL — ABNORMAL LOW (ref >59)
LDL Chol Calc Piccolo Waived: 79 mg/dL (ref <100)
Triglycerides Piccolo,Waived: 156 mg/dL — ABNORMAL HIGH (ref <150)
VLDL Chol Calc Piccolo,Waive: 31 mg/dL — ABNORMAL HIGH (ref <30)

## 2017-09-26 MED ORDER — DULOXETINE HCL 60 MG PO CPEP: 60.0000 mg | ORAL_CAPSULE | Freq: Every day | ORAL | 2 refills | Status: DC

## 2017-09-26 NOTE — Progress Notes (Signed)
   BP (!) 161/78   Pulse 74   Wt 189 lb (85.7 kg)   SpO2 98%   BMI 29.16 kg/m    Subjective:    Patient ID: Todd [name], male    DOB: Feb 01, 1931, 84 y.o.   MRN: [id number]  HPI: Todd [name] is a 84 y.o. male  Chief Complaint  Patient presents with  . Follow-up  . Hypertension  Patient here accompanied by his daughter-in-law who assists with history. Patient especially frustrated by late his stroke which is affected his speech ability and has expressive a aphasia. Is working with physical therapy and speech therapy to help with his having coping issues nerves and depression taken duloxetine 20 mg 1 a day. Blood pressure checked regularly is good to slightly elevated.  Takes medications without problems. Reviewed last hemoglobin A1c last month which was normal.  Relevant past medical, surgical, family and social history reviewed and updated as indicated. Interim medical history since our last visit reviewed. Allergies and medications reviewed and updated.  Review of Systems  Constitutional: Negative.   Respiratory: Negative.   Cardiovascular: Negative.     Per HPI unless specifically indicated above     Objective:    BP (!) 161/78   Pulse 74   Wt 189 lb (85.7 kg)   SpO2 98%   BMI 29.16 kg/m   Wt Readings from Last 3 Encounters:  09/26/17 189 lb (85.7 kg)  09/21/17 192 lb (87.1 kg)  08/23/17 188 lb 3 oz (85.4 kg)    Physical Exam  Constitutional: He is oriented to person, place, and time. He appears well-developed and well-nourished.  HENT:  Head: Normocephalic and atraumatic.  Eyes: Conjunctivae and EOM are normal.  Neck: Normal range of motion.  Cardiovascular: Normal rate, regular rhythm and normal heart sounds.  Pulmonary/Chest: Effort normal and breath sounds normal.  Musculoskeletal: Normal range of motion.  Neurological: He is alert and oriented to person, place, and time.  Skin: No erythema.  Psychiatric: He has a normal mood and affect. His behavior is  normal. Judgment and thought content normal.    Results for orders placed or performed in visit on 09/12/17  PSA  Result Value Ref Range   Prostate Specific Ag, Serum 2.0 0.0 - 4.0 ng/mL  Testosterone  Result Value Ref Range   Testosterone 9 (L) 264 - 916 ng/dL      Assessment & Plan:   Problem List Items Addressed This Visit      Cardiovascular and Mediastinum   Hypertension - Primary   Relevant Orders   Basic metabolic panel   LP+ALT+AST Piccolo, Waived     Other   Anxiety, generalized   Relevant Medications   DULoxetine (CYMBALTA) 60 MG capsule   Mixed hyperlipidemia    The current medical regimen is effective;  continue present plan and medications.        Other Visit Diagnoses    Acute CVA (cerebrovascular accident) [facility])       Relevant Orders   Basic metabolic panel   LP+ALT+AST Piccolo, Waived       Follow up plan: Return in about 3 months (around 12/26/2017) for Physical Exam, Hemoglobin A1c.

## 2017-09-26 NOTE — Assessment & Plan Note (Signed)
The current medical regimen is effective;  continue present plan and medications.  

## 2017-09-27 ENCOUNTER — Encounter: Payer: Self-pay | Admitting: Family Medicine

## 2017-09-27 ENCOUNTER — Ambulatory Visit: Payer: PPO | Admitting: Cardiovascular Disease

## 2017-09-27 ENCOUNTER — Encounter

## 2017-09-27 DIAGNOSIS — R0989 Other specified symptoms and signs involving the circulatory and respiratory systems: Secondary | ICD-10-CM

## 2017-09-27 LAB — BASIC METABOLIC PANEL
BUN/Creatinine Ratio: 21 (ref 10–24)
BUN: 24 mg/dL (ref 8–27)
CO2: 25 mmol/L (ref 20–29)
Calcium: 9.6 mg/dL (ref 8.6–10.2)
Chloride: 98 mmol/L (ref 96–106)
Creatinine, Ser: 1.16 mg/dL (ref 0.76–1.27)
GFR calc Af Amer: 66 mL/min/{1.73_m2} (ref >59)
GFR calc non Af Amer: 58 mL/min/{1.73_m2} — ABNORMAL LOW (ref >59)
Glucose: 169 mg/dL — ABNORMAL HIGH (ref 65–99)
Potassium: 4.6 mmol/L (ref 3.5–5.2)
Sodium: 139 mmol/L (ref 134–144)

## 2017-09-28 ENCOUNTER — Ambulatory Visit: Payer: Self-pay

## 2017-09-28 ENCOUNTER — Ambulatory Visit: Payer: PPO | Admitting: Speech Pathology

## 2017-09-28 ENCOUNTER — Encounter: Payer: Self-pay | Admitting: Speech Pathology

## 2017-09-28 ENCOUNTER — Encounter: Payer: Self-pay | Admitting: Cardiovascular Disease

## 2017-09-28 DIAGNOSIS — R4701 Aphasia: Secondary | ICD-10-CM

## 2017-09-28 DIAGNOSIS — I69190 Apraxia following nontraumatic intracerebral hemorrhage: Secondary | ICD-10-CM

## 2017-09-28 NOTE — Therapy (Signed)
[place] MAIN [facility] SERVICES [address] [place], [place], [address] Phone: [phone]   Fax:  [phone]  Speech Language Pathology Treatment  Patient Details  Name: Todd [name] MRN: [id number] Date of Birth: 1930-11-07 Referring Provider: Valerie Roys    Encounter Date: 09/28/2017  End of Session - 09/28/17 1559    Visit Number  3    Number of Visits  17    Date for SLP Re-Evaluation  11/02/17    SLP Start Time  1500    SLP Stop Time   7989    SLP Time Calculation (min)  55 min    Activity Tolerance  Patient tolerated treatment well       Past Medical History:  Diagnosis Date  . Anxiety   . Aortic regurgitation    a. 03/2017 Echo: Mod AI; b. 05/2017 Echo: Triv AI.  [name] Kitchen Arthritis    osteoarthritis-left knee  . CAD (coronary artery disease)    a. Reported h/o cath ~ 2008 - ? small vessel dzs. No PCI performed; b. 03/2017 MV: EF 45-54%, no ischemia/infarct.  . Depression   . Diabetes mellitus without complication ([place])   . Diastolic dysfunction    a. 03/2017 Echo: EF 55-65%; b. 05/2017 Echo: EF 55-60%, no rwma, Gr1 DD. Triv AI.  Mildly dil LA.  . ED (erectile dysfunction)   . Elevated troponin    a. chronic mild elevation - 0.03->0.04.  [name] Kitchen GERD (gastroesophageal reflux disease)   . Glaucoma   . Hyperlipidemia   . Hypertension   . Iron deficiency anemia due to chronic blood loss   . Left pontine stroke w/ cerebrovascular disease(HCC)    a. 05/2017 MRI/A: acute to subacute L pontine infarct. Chronic left thalamic lacunar infarct. Severe basilar artery stenosis w/ radiographic string sign of the distal 1/3. Mild to moderate L PCA P2 segment stenosis; b. 05/2017 Carotid U/S: <50% bilat ICA stenosis.  . Lumbago    Lumbosacral Neuritis  . Prostate cancer [facility])     Past Surgical History:  Procedure Laterality Date  . CARPAL TUNNEL RELEASE    . REPLACEMENT TOTAL KNEE BILATERAL      There were no vitals filed for this  visit.  Subjective Assessment - 09/28/17 1558    Subjective  "today is my birthday"            ADULT SLP TREATMENT - 09/28/17 0001      General Information   Behavior/Cognition  Alert;Cooperative;Pleasant mood;Impulsive    HPI   84 year old man S/P left pontine stroke 05/17/2017.  The patient received rehab services, including speech therapy, at a SNF (05/21/2017 - 06/22/2017) and via home health (06/22/2017 - 08/09/2017).  Past medical history includes vascular dementia.       Treatment Provided   Treatment provided  Cognitive-Linquistic      Pain Assessment   Pain Assessment  No/denies pain      Cognitive-Linquistic Treatment   Treatment focused on  Aphasia;Apraxia    Skilled Treatment  Name pictured objects with 80% accuracy and answer related questions re: activity in photo with 75% accuracy.  Errors include phonemic paraphasia, perseveration, and blockage.  Patient answered yes/no questions with 78% accuracy. Pt provided one word response during similarities task 2/3x.   Pt was able to inform SLP that today is his birthday. Max assist required to produce "happy birthday to me". Sister indicates pt gets frustrated at home with not being able to communicate effectively.  Assessment / Recommendations / Plan   Plan  Continue with current plan of care      Progression Toward Goals   Progression toward goals  Progressing toward goals       SLP Education - 09/28/17 1559    Education provided  Yes    Education Details  Continue to relax!    Person(s) Educated  Patient    Methods  Explanation    Comprehension  Verbalized understanding;Need further instruction         SLP Long Term Goals - 09/28/17 1600      SLP LONG TERM GOAL #1   Title  Patient will read aloud or repeat words/phrases/sentences, maintaining phonemic accuracy, with 80% accuracy/fluency.      Time  8    Period  Weeks    Status  On-going    Target Date  11/02/17      SLP LONG TERM GOAL #2   Title   Patient will complete semantic feature word finding tasks with 80% accuracy.    Time  8    Period  Weeks    Status  On-going    Target Date  11/02/17      SLP LONG TERM GOAL #3   Title  Patient will generate grammatical and cogent phrases to complete simple/concrete linguistic task with 80% accuracy.    Time  8    Period  Weeks    Status  On-going    Target Date  11/02/17      SLP LONG TERM GOAL #4   Title  Patient will complete multi-unit processing tasks with 80% accuracy without the need of repetition of task instructions or significant delays in responding.    Time  8    Period  Weeks    Status  On-going    Target Date  11/02/17       Plan - 09/28/17 1559    Clinical Impression Statement  The patient continues to struggle with the apraxia and perseveration.  He is easily frustrated, but is also responsive to cues that help him stop and respond to cues given. Continue skilled ST intervention per primary SLP plan of care    Speech Therapy Frequency  2x / week    Duration  Other (comment)    Treatment/Interventions  Language facilitation;Cueing hierarchy;SLP instruction and feedback;Multimodal communcation approach;Patient/family education    Potential to Achieve Goals  Fair    Potential Considerations  Ability to learn/carryover information;Pain level;Family/community support;Co-morbidities;Previous level of function;Cooperation/participation level;Severity of impairments;Medical prognosis    SLP Home Exercise Plan  To be determined    Consulted and Agree with Plan of Care  Patient       Patient will benefit from skilled therapeutic intervention in order to improve the following deficits and impairments:   Aphasia  Apraxia following nontraumatic intracerebral hemorrhage    Problem List Patient Active Problem List   Diagnosis Date Noted  . Mixed hyperlipidemia 09/21/2017  . Aphasia 05/18/2017  . CAD (coronary artery disease) 05/18/2017  . History of stroke with  residual deficit 05/18/2017  . 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 Urinary incontinence 04/27/2015  . Anxiety, generalized 04/07/2015  . History of BCG vaccination 04/07/2015  . GERD (gastroesophageal reflux disease) 03/04/2015  . Depression 02/25/2015  . Degeneration of intervertebral disc of lumbar region 02/23/2015  . Neuritis or radiculitis due to rupture of lumbar intervertebral disc 02/23/2015  . Lumbar canal stenosis 02/23/2015  . Hypertension 12/02/2014  . Diabetes mellitus without  complication ([place]) 16/03/9603  . Benign essential tremor 03/13/2014  . Imbalance 03/13/2014  . Malignant neoplasm of prostate ([place]) 10/07/2012  . ED (erectile dysfunction) of organic origin 02/09/2012   Mohammedali Bedoy [facility], CCC-SLP Speech Language Pathologist  Shonna Chock 09/28/2017, 4:02 PM  [place] MAIN [facility] SERVICES [address] [place], [place], [address] Phone: [phone]   Fax:  [phone]   Name: Todd [name] MRN: [id number] Date of Birth: 1930-11-28

## 2017-10-01 ENCOUNTER — Ambulatory Visit: Payer: PPO

## 2017-10-01 ENCOUNTER — Ambulatory Visit: Payer: PPO | Admitting: Cardiovascular Disease

## 2017-10-01 VITALS — BP 130/55 | HR 68

## 2017-10-01 DIAGNOSIS — R4701 Aphasia: Secondary | ICD-10-CM | POA: Diagnosis not present

## 2017-10-01 DIAGNOSIS — R2681 Unsteadiness on feet: Secondary | ICD-10-CM

## 2017-10-01 DIAGNOSIS — M6281 Muscle weakness (generalized): Secondary | ICD-10-CM

## 2017-10-02 NOTE — Therapy (Signed)
[place] MAIN [facility] SERVICES [address] [place], [place], [address] Phone: [phone]   Fax:  [phone]  Physical Therapy Evaluation  Patient Details  Name: Todd [name] MRN: [id number] Date of Birth: 27-Jan-1931 Referring Provider: Park Liter  Encounter Date: 10/01/2017  PT End of Session - 10/01/17 1618    Visit Number  1    Number of Visits  17    Date for PT Re-Evaluation  11/26/17    Authorization Type  progress note 1/10    PT Start Time  1610    PT Stop Time  1650    PT Time Calculation (min)  40 min    Equipment Utilized During Treatment  Gait belt    Activity Tolerance  Patient tolerated treatment well    Behavior During Therapy  [facility] for tasks assessed/performed       Past Medical History:  Diagnosis Date  . Anxiety   . Aortic regurgitation    a. 03/2017 Echo: Mod AI; b. 05/2017 Echo: Triv AI.  [name] Kitchen Arthritis    osteoarthritis-left knee  . CAD (coronary artery disease)    a. Reported h/o cath ~ 2008 - ? small vessel dzs. No PCI performed; b. 03/2017 MV: EF 45-54%, no ischemia/infarct.  . Depression   . Diabetes mellitus without complication ([place])   . Diastolic dysfunction    a. 03/2017 Echo: EF 55-65%; b. 05/2017 Echo: EF 55-60%, no rwma, Gr1 DD. Triv AI.  Mildly dil LA.  . ED (erectile dysfunction)   . Elevated troponin    a. chronic mild elevation - 0.03->0.04.  [name] Kitchen GERD (gastroesophageal reflux disease)   . Glaucoma   . Hyperlipidemia   . Hypertension   . Iron deficiency anemia due to chronic blood loss   . Left pontine stroke w/ cerebrovascular disease(HCC)    a. 05/2017 MRI/A: acute to subacute L pontine infarct. Chronic left thalamic lacunar infarct. Severe basilar artery stenosis w/ radiographic string sign of the distal 1/3. Mild to moderate L PCA P2 segment stenosis; b. 05/2017 Carotid U/S: <50% bilat ICA stenosis.  . Lumbago    Lumbosacral Neuritis  . Prostate cancer [facility])     Past Surgical History:   Procedure Laterality Date  . CARPAL TUNNEL RELEASE    . REPLACEMENT TOTAL KNEE BILATERAL      Vitals:   10/01/17 1617  BP: (!) 130/55  Pulse: 68  SpO2: 100%     Subjective Assessment - 10/01/17 1616    Subjective  CVA    Patient is accompained by:  Family member Daughter    Pertinent History  84 year old man S/P left pontine stroke 05/17/2017.  The patient received rehab services, including speech therapy, at a SNF (05/21/2017 - 06/22/2017) and via home health (06/22/2017 - 08/09/2017).  Past medical history includes vascular dementia.  Hx of back pain. Pt with significant expressive aphasia.    Limitations  Walking    How long can you walk comfortably?  Very short distances. Pt becomes winded easily. Pt normally only walks approximately 44m at a time per daughter.     Currently in Pain?  No/denies         [facility] PT Assessment - 10/01/17 1620      Assessment   Medical Diagnosis  CVA    Referring Provider  Park Liter    Onset Date/Surgical Date  05/17/17    Hand Dominance  Left    Next MD Visit  Will see PCP at the  end of May 2019    Prior Therapy  At [facility] following CVA      Precautions   Precautions  Fall      Restrictions   Weight Bearing Restrictions  No      Balance Screen   Has the patient fallen in the past 6 months  Yes    How many times?  1    Has the patient had a decrease in activity level because of a fear of falling?   Yes    Is the patient reluctant to leave their home because of a fear of falling?   Yes      [place]  Private residence    Living Arrangements  Children    Available Help at Discharge  Family    Type of [place]  One level    [place] - 2 wheels;Cane - single point;Bedside commode;Toilet riser;Shower seat;Grab bars - tub/shower      Prior Function   Level of Independence  Needs assistance with ADLs    Comments  Assist with shaving  and cleaning his back      Cognition   Overall Cognitive Status  -- Expressive aphasia      ROM / Strength   AROM / PROM / Strength  Strength      Strength   Overall Strength Comments  Grossly 4+ to 5/5 throughout bilateral UE/LE with the exception of 4-/5 bilateral ankle dorsiflexion. No focal weakness identified with MMT. Pt reports decreased light touch sensation to RUE and RLE compared to L side      Palpation   Palpation comment  Deferred      Ambulation/Gait   Gait Comments  Pt ambulates with rolling walker in front of body requiring cues to stay close to walker      Standardized Balance Assessment   Standardized Balance Assessment  Berg Balance Test;Timed Up and Go Test;Five Times Sit to Stand;10 meter walk test    Five times sit to stand comments   Unable to stand without UE support      Berg Balance Test   Sit to Stand  Able to stand  independently using hands    Standing Unsupported  Able to stand 2 minutes with supervision    Sitting with Back Unsupported but Feet Supported on Floor or Stool  Able to sit safely and securely 2 minutes    Stand to Sit  Uses backs of legs against chair to control descent    Transfers  Needs one person to assist    Standing Unsupported with Eyes Closed  Able to stand 10 seconds with supervision    Standing Ubsupported with Feet Together  Needs help to attain position and unable to hold for 15 seconds    From Standing, Reach Forward with Outstretched Arm  Reaches forward but needs supervision    From Standing Position, Pick up Object from Floor  Able to pick up shoe, needs supervision    From Standing Position, Turn to Look Behind Over each Shoulder  Needs supervision when turning    Turn 360 Degrees  Needs close supervision or verbal cueing    Standing Unsupported, Alternately Place Feet on Step/Stool  Needs assistance to keep from falling or unable to try    Standing Unsupported, One Foot in Front  Needs help to step but can hold  15 seconds     Standing on One Leg  Tries to lift leg/unable to hold 3 seconds but remains standing independently    Total Score  24      Timed Up and Go Test   TUG  Normal TUG    Normal TUG (seconds)  40.1         Objective measurements completed on examination: See above findings.                      PT Education - 10/01/17 1618    Education provided  Yes    Education Details  Plan of care    Person(s) Educated  Patient    Methods  Explanation    Comprehension  Verbalized understanding       PT Short Term Goals - 10/02/17 2059      PT SHORT TERM GOAL #1   Title  Pt will be independent with HEP in order to improve strength and balance in order to decrease fall risk and improve function at home and work.     Time  8    Period  Weeks    Status  New    Target Date  11/27/17        PT Long Term Goals - 10/02/17 2059      PT LONG TERM GOAL #1   Title  Pt will improve BERG by at least 3 points in order to demonstrate clinically significant improvement in balance.     Baseline  10/01/17: 24/56    Time  8    Period  Weeks    Status  New    Target Date  11/26/17      PT LONG TERM GOAL #2   Title  Pt will decrease TUG to below 14 seconds in order to demonstrate decreased fall risk     Baseline  10/01/17: 40.1s    Time  8    Period  Weeks    Status  New    Target Date  11/26/17             Plan - 10/02/17 2102    Clinical Impression Statement  Pt is a pleasant 84 yo African American male referred for deficits related to CVA 05/17/18. Pt with expressive aphasia and history is assisted by daughter. No focal weakness identified in bilateral UE/LE with the exception of 4-/5 bilateral ankle dorsiflexion. Pt with notable balance deficits scoring a 24/56 on the BERG. TUG of 40.1s also confirms increased fall risk and decreased LE strength. Pt will benefit from skilled PT services to address deficits in strength and balance in order to decrease fall risk and improve  function.     History and Personal Factors relevant to plan of care:  2 personal factors/comorbidities, 3 body systems/activity limitations/participation restrictions      Clinical Presentation  Evolving    Clinical Presentation due to:  Recent stroke and symptoms are still improving    Clinical Decision Making  Moderate    Rehab Potential  Fair    Clinical Impairments Affecting Rehab Potential  Positive: acuity, Negative: lack of motivation    PT Frequency  2x / week    PT Duration  8 weeks    PT Treatment/Interventions  ADLs/Self Care Home Management;Aquatic Therapy;Biofeedback;Canalith Repostioning;Cryotherapy;Electrical Stimulation;Traction;Ultrasound;DME Instruction;Gait Scientist, forensic;Therapeutic activities;Therapeutic exercise;Balance training;Neuromuscular re-education;Patient/family education;Manual techniques;Vestibular;Cognitive remediation;Energy conservation;Dry needling    PT Next Visit Plan  Review HEP (pt to bring), initiate balance and strength training  PT Home Exercise Plan  None issued at this time. Pt encouraged to continue current home program    Consulted and Agree with Plan of Care  Patient       Patient will benefit from skilled therapeutic intervention in order to improve the following deficits and impairments:  Decreased strength, Difficulty walking, Decreased balance  Visit Diagnosis: Unsteadiness on feet  Muscle weakness (generalized)     Problem List Patient Active Problem List   Diagnosis Date Noted  . Mixed hyperlipidemia 09/21/2017  . Aphasia 05/18/2017  . CAD (coronary artery disease) 05/18/2017  . History of stroke with residual deficit 05/18/2017  . 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 Urinary incontinence 04/27/2015  . Anxiety, generalized 04/07/2015  . History of BCG vaccination 04/07/2015  . GERD (gastroesophageal reflux disease) 03/04/2015  . Depression 02/25/2015  . Degeneration of intervertebral disc of lumbar  region 02/23/2015  . Neuritis or radiculitis due to rupture of lumbar intervertebral disc 02/23/2015  . Lumbar canal stenosis 02/23/2015  . Hypertension 12/02/2014  . Diabetes mellitus without complication ([place]) 81/44/8185  . Benign essential tremor 03/13/2014  . Imbalance 03/13/2014  . Malignant neoplasm of prostate ([place]) 10/07/2012  . ED (erectile dysfunction) of organic origin 02/09/2012   Phillips Grout PT, DPT   Huprich,Jason 10/02/2017, 9:05 PM  [place] MAIN [facility] SERVICES [address] [place], [place], [address] Phone: [phone]   Fax:  [phone]  Name: Todd [name] MRN: [id number] Date of Birth: March 13, 1931

## 2017-10-04 ENCOUNTER — Ambulatory Visit: Payer: PPO | Admitting: Physical Therapy

## 2017-10-08 ENCOUNTER — Encounter: Payer: Self-pay | Admitting: Speech Pathology

## 2017-10-08 ENCOUNTER — Ambulatory Visit: Payer: PPO

## 2017-10-08 ENCOUNTER — Ambulatory Visit: Payer: PPO | Admitting: Urology

## 2017-10-08 ENCOUNTER — Ambulatory Visit: Payer: PPO | Attending: Family Medicine | Admitting: Speech Pathology

## 2017-10-08 ENCOUNTER — Encounter: Payer: Self-pay | Admitting: Urology

## 2017-10-08 VITALS — BP 151/69 | HR 62 | Ht 67.5 in

## 2017-10-08 VITALS — BP 158/64 | HR 59

## 2017-10-08 DIAGNOSIS — R4701 Aphasia: Secondary | ICD-10-CM | POA: Diagnosis not present

## 2017-10-08 DIAGNOSIS — R2681 Unsteadiness on feet: Secondary | ICD-10-CM

## 2017-10-08 DIAGNOSIS — I69190 Apraxia following nontraumatic intracerebral hemorrhage: Secondary | ICD-10-CM | POA: Diagnosis not present

## 2017-10-08 DIAGNOSIS — M6281 Muscle weakness (generalized): Secondary | ICD-10-CM | POA: Diagnosis not present

## 2017-10-08 DIAGNOSIS — C61 Malignant neoplasm of prostate: Secondary | ICD-10-CM

## 2017-10-08 MED ORDER — LEUPROLIDE ACETATE (6 MONTH) 45 MG IM KIT
45.0000 mg | PACK | Freq: Once | INTRAMUSCULAR | Status: AC
  Administered 2017-10-08: 45 mg via INTRAMUSCULAR

## 2017-10-08 NOTE — Progress Notes (Signed)
10/08/2017 1:08 PM   Todd [name] November 20, 1930 [id number]  Referring provider: Guadalupe Maple, MD [address] [place], [place] [address]  No chief complaint on file.   HPI:  F/u -   1 - Prostate Cancer- treated with primary radiation for unknown grade / stage disease years ago, then biochemical recurrence. Now on Lupron Q56monthy. However was lost to follow-up. Received last 6 month injection of Lupron injection inJune 2017 Did not receive another until July 2018 which is only a three-month injection. No new skeletal pain. 04/2015 PSA 0.3 --> Lupron 426m6/2017 PSA 0.03 / T 11 --> Lupron 4588mLost to follow up) 12/2016 PSA 3.5/ T115 --> Lupron 22.5 mg 03/2017 PSA 2.0/T 8 --> Lupron 45 mg 04/2017 MRI L-spine - no mets 09/2017 PSA 2.0/T 9 --> Lupron 45 mg   2 - Lower Urinary Tract Symptoms / Urge Urinary Incontinence- Slowly progressive bother for urienary urgency with inctontinence. PVR 04/2015 "75m3mTried Vesicare 5mg 32mly but no significant improvement. Cysto 04/2015 without stricture / mass / radiation cystitis. Giventrial Myrbetriq and reports modest improvement. However he has since stopped taking his medication. Not currently bothered by symptoms.    He returns for Lupron 45 mg. Overall, PSA remains stable. He's not having any trouble voiding. Urine clear. MRI l-spine noted. He has fatigue. CVA 05/2017. He is in rehab. Notes reviewed from 05/2017 admission.   PMH: Past Medical History:  Diagnosis Date  . Anxiety   . Aortic regurgitation    a. 03/2017 Echo: Mod AI; b. 05/2017 Echo: Triv AI.  . ArtMarland Kitchenritis    osteoarthritis-left knee  . CAD (coronary artery disease)    a. Reported h/o cath ~ 2008 - ? small vessel dzs. No PCI performed; b. 03/2017 MV: EF 45-54%, no ischemia/infarct.  . Depression   . Diabetes mellitus without complication (HCC) [place] Diastolic dysfunction    a. 03/2017 Echo: EF 55-65%; b. 05/2017 Echo: EF 55-60%, no rwma, Gr1 DD. Triv AI.  Mildly  dil LA.  . ED (erectile dysfunction)   . Elevated troponin    a. chronic mild elevation - 0.03->0.04.  . GERMarland Kitchen (gastroesophageal reflux disease)   . Glaucoma   . Hyperlipidemia   . Hypertension   . Iron deficiency anemia due to chronic blood loss   . Left pontine stroke w/ cerebrovascular disease(HCC)    a. 05/2017 MRI/A: acute to subacute L pontine infarct. Chronic left thalamic lacunar infarct. Severe basilar artery stenosis w/ radiographic string sign of the distal 1/3. Mild to moderate L PCA P2 segment stenosis; b. 05/2017 Carotid U/S: <50% bilat ICA stenosis.  . Lumbago    Lumbosacral Neuritis  . Prostate cancer (HCC)[facility] Surgical History: Past Surgical History:  Procedure Laterality Date  . CARPAL TUNNEL RELEASE    . REPLACEMENT TOTAL KNEE BILATERAL      Home Medications:  Allergies as of 10/08/2017      Reactions   Amlodipine    Lower extremity swelling at 10 mg.   Fluoxetine Other (See Comments)   headache   Meloxicam Other (See Comments)   Other reaction(s): Dizziness Nervousness.      Medication List        Accurate as of 10/08/17  1:08 PM. Always use your most recent med list.          ACCU-CHEK AVIVA PLUS w/Device Kit Use to check sugar levels x 2 daily   accu-chek soft touch lancets Use as instructed  aspirin EC 81 MG tablet Take 81 mg by mouth daily.   atorvastatin 10 MG tablet Commonly known as:  LIPITOR Take 1 tablet (10 mg total) by mouth daily.   busPIRone 10 MG tablet Commonly known as:  BUSPAR Take 10 mg by mouth at bedtime.   clonazePAM 0.5 MG tablet Commonly known as:  KLONOPIN Take 1 tablet (0.5 mg total) by mouth daily as needed for anxiety.   clopidogrel 75 MG tablet Commonly known as:  PLAVIX Take 1 tablet (75 mg total) by mouth daily.   DULoxetine 60 MG capsule Commonly known as:  CYMBALTA Take 1 capsule (60 mg total) by mouth daily.   gabapentin 100 MG capsule Commonly known as:  NEURONTIN Take 100 mg by mouth 2 (two)  times daily.   hydrALAZINE 25 MG tablet Commonly known as:  APRESOLINE Take 1 tablet (25 mg total) by mouth 3 (three) times daily.   hydrochlorothiazide 25 MG tablet Commonly known as:  HYDRODIURIL Take 1 tablet (25 mg total) by mouth daily.   latanoprost 0.005 % ophthalmic solution Commonly known as:  XALATAN Place 1 drop into both eyes at bedtime.   metFORMIN 500 MG tablet Commonly known as:  GLUCOPHAGE Take 1 tablet (500 mg total) by mouth daily with breakfast.   naproxen sodium 220 MG tablet Commonly known as:  ALEVE Take 220 mg by mouth 2 (two) times daily as needed.   pantoprazole 40 MG tablet Commonly known as:  PROTONIX Take 1 tablet (40 mg total) by mouth daily.   propranolol ER 80 MG 24 hr capsule Commonly known as:  INDERAL LA Take 1 capsule (80 mg total) by mouth daily.   telmisartan 80 MG tablet Commonly known as:  MICARDIS Take 1 tablet (80 mg total) by mouth daily.   TYLENOL 8 HOUR ARTHRITIS PAIN 650 MG CR tablet Generic drug:  acetaminophen Take 650 mg by mouth every 8 (eight) hours as needed for pain.       Allergies:  Allergies  Allergen Reactions  . Amlodipine     Lower extremity swelling at 10 mg.  . Fluoxetine Other (See Comments)    headache  . Meloxicam Other (See Comments)    Other reaction(s): Dizziness Nervousness.     Family History: Family History  Problem Relation Age of Onset  . Stroke Mother   . Hypertension Mother   . Hypertension Father   . Stroke Sister   . Prostate cancer Neg Hx   . Kidney cancer Neg Hx   . Bladder Cancer Neg Hx     Social History:  reports that he quit smoking about 22 years ago. He has never used smokeless tobacco. He reports that he drinks about 0.6 oz of alcohol per week. He reports that he does not use drugs.  ROS:                                        Physical Exam: There were no vitals taken for this visit.  Constitutional:  Alert and oriented, No acute  distress. In wheelchair.  HEENT: [place] AT, moist mucus membranes.  Trachea midline, no masses. Cardiovascular: No clubbing, cyanosis, or edema. Respiratory: Normal respiratory effort, no increased work of breathing. GI: Abdomen is soft, nontender, nondistended, no abdominal masses GU: No CVA tenderness Skin: No rashes, bruises or suspicious lesions. Neurologic: Grossly intact, no focal deficits, moving all 4 extremities. Psychiatric: Normal  mood and affect.  Laboratory Data: Lab Results  Component Value Date   WBC 5.3 07/16/2017   HGB 11.7 (L) 07/16/2017   HCT 36.2 (L) 07/16/2017   MCV 95.9 07/16/2017   PLT 212 07/16/2017    Lab Results  Component Value Date   CREATININE 1.16 09/26/2017    No results found for: PSA  Lab Results  Component Value Date   TESTOSTERONE 9 (L) 09/12/2017    Lab Results  Component Value Date   HGBA1C 6.3 (H) 05/18/2017    Urinalysis    Component Value Date/Time   COLORURINE YELLOW (A) 05/18/2017 0156   APPEARANCEUR Clear 08/23/2017 1503   LABSPEC 1.015 05/18/2017 0156   PHURINE 5.0 05/18/2017 0156   GLUCOSEU Trace (A) 08/23/2017 1503   HGBUR NEGATIVE 05/18/2017 0156   BILIRUBINUR Negative 08/23/2017 1503   [place] 05/18/2017 0156   PROTEINUR Negative 08/23/2017 1503   PROTEINUR NEGATIVE 05/18/2017 0156   NITRITE Negative 08/23/2017 1503   NITRITE NEGATIVE 05/18/2017 0156   LEUKOCYTESUR Negative 08/23/2017 1503    Lab Results  Component Value Date   LABMICR See below: 01/09/2017   WBCUA 0-5 01/09/2017   RBCUA 0-2 01/09/2017   LABEPIT 0-10 01/09/2017   MUCUS Present (A) 01/09/2017   BACTERIA NONE SEEN 05/18/2017     No results found for this or any previous visit. No results found for this or any previous visit. No results found for this or any previous visit. No results found for this or any previous visit. No results found for this or any previous visit. No results found for this or any previous visit. No  results found for this or any previous visit. No results found for this or any previous visit.  Assessment & Plan:    PCa - he has fatigue - we discussed (pt and cregivers) the nature r/b of IADT vs CADT. Will continue ADT and consider IADT in the future.   No follow-ups on file.  Festus Aloe, MD  [facility] [address] [place], [place] [address] [phone]

## 2017-10-08 NOTE — Therapy (Signed)
[place] MAIN [facility] SERVICES [address] [place], [place], [address] Phone: [phone]   Fax:  [phone]  Physical Therapy Treatment  Patient Details  Name: Todd [name] MRN: [id number] Date of Birth: 1931/05/09 Referring Provider: Park Liter   Encounter Date: 10/08/2017  PT End of Session - 10/08/17 1607    Visit Number  2    Number of Visits  17    Date for PT Re-Evaluation  11/26/17    Authorization Type  progress note 2/10    PT Start Time  1601    PT Stop Time  1645    PT Time Calculation (min)  44 min    Equipment Utilized During Treatment  Gait belt    Activity Tolerance  Patient tolerated treatment well    Behavior During Therapy  [facility] for tasks assessed/performed       Past Medical History:  Diagnosis Date  . Anxiety   . Aortic regurgitation    a. 03/2017 Echo: Mod AI; b. 05/2017 Echo: Triv AI.  [name] Kitchen Arthritis    osteoarthritis-left knee  . CAD (coronary artery disease)    a. Reported h/o cath ~ 2008 - ? small vessel dzs. No PCI performed; b. 03/2017 MV: EF 45-54%, no ischemia/infarct.  . Depression   . Diabetes mellitus without complication ([place])   . Diastolic dysfunction    a. 03/2017 Echo: EF 55-65%; b. 05/2017 Echo: EF 55-60%, no rwma, Gr1 DD. Triv AI.  Mildly dil LA.  . ED (erectile dysfunction)   . Elevated troponin    a. chronic mild elevation - 0.03->0.04.  [name] Kitchen GERD (gastroesophageal reflux disease)   . Glaucoma   . Hyperlipidemia   . Hypertension   . Iron deficiency anemia due to chronic blood loss   . Left pontine stroke w/ cerebrovascular disease(HCC)    a. 05/2017 MRI/A: acute to subacute L pontine infarct. Chronic left thalamic lacunar infarct. Severe basilar artery stenosis w/ radiographic string sign of the distal 1/3. Mild to moderate L PCA P2 segment stenosis; b. 05/2017 Carotid U/S: <50% bilat ICA stenosis.  . Lumbago    Lumbosacral Neuritis  . Prostate cancer [facility])     Past Surgical History:   Procedure Laterality Date  . CARPAL TUNNEL RELEASE    . REPLACEMENT TOTAL KNEE BILATERAL      Vitals:   10/08/17 1628  BP: (!) 158/64  Pulse: (!) 59  SpO2: 99%    Subjective Assessment - 10/08/17 1606    Subjective  Pt reports that he feels a little out of breath today. He denies pain and agrees to working with therapy.     Pertinent History  84 year old man S/P left pontine stroke 05/17/2017.  The patient received rehab services, including speech therapy, at a SNF (05/21/2017 - 06/22/2017) and via home health (06/22/2017 - 08/09/2017).  Past medical history includes vascular dementia.  Hx of back pain. Pt with significant expressive aphasia.    Limitations  Walking    How long can you walk comfortably?  Very short distances. Pt becomes winded easily. Pt normally only walks approximately 14m at a time per daughter.     Currently in Pain?  No/denies         TREATMENT  Ther-ex  NuStep L2 x 6 minutes during history (5 minutes unbilled); Seated marches with 3# ankle weights 2 x 10 bilateral; Seated clams with manual resistance 2 x 10; Seated adductor squeeze 2 x 10; Seated LAQ with 3# ankle  weights 2 x 10; Seated HS curls with manual resistance 2 x 10; Seated heel raises 2 x 10; Sit to stand from elevated mat table without UE support x 10;                      PT Education - 10/08/17 1607    Education provided  Yes    Education Details  exercise form/technique    Person(s) Educated  Patient    Methods  Explanation    Comprehension  Verbalized understanding       PT Short Term Goals - 10/02/17 2059      PT SHORT TERM GOAL #1   Title  Pt will be independent with HEP in order to improve strength and balance in order to decrease fall risk and improve function at home and work.     Time  8    Period  Weeks    Status  New    Target Date  11/27/17        PT Long Term Goals - 10/02/17 2059      PT LONG TERM GOAL #1   Title  Pt will improve BERG by at  least 3 points in order to demonstrate clinically significant improvement in balance.     Baseline  10/01/17: 24/56    Time  8    Period  Weeks    Status  New    Target Date  11/26/17      PT LONG TERM GOAL #2   Title  Pt will decrease TUG to below 14 seconds in order to demonstrate decreased fall risk     Baseline  10/01/17: 40.1s    Time  8    Period  Weeks    Status  New    Target Date  11/26/17            Plan - 10/08/17 1633    Clinical Impression Statement  Pt denies any shortness of breath during session. Vitals within acceptable limits upon arrival with the exception of some hypertension. Pt is able to complete all seated exercises but has to use the restroom in the middle of the session. Son helps with toileting and bathroom and bathroom break takes an extended time which somewhat limits session today. Pt encouraged to follow-up as scheduled.     Rehab Potential  Fair    Clinical Impairments Affecting Rehab Potential  Positive: acuity, Negative: lack of motivation    PT Frequency  2x / week    PT Duration  8 weeks    PT Treatment/Interventions  ADLs/Self Care Home Management;Aquatic Therapy;Biofeedback;Canalith Repostioning;Cryotherapy;Electrical Stimulation;Traction;Ultrasound;DME Instruction;Gait Scientist, forensic;Therapeutic activities;Therapeutic exercise;Balance training;Neuromuscular re-education;Patient/family education;Manual techniques;Vestibular;Cognitive remediation;Energy conservation;Dry needling    PT Next Visit Plan  Review HEP (pt to bring), initiate balance and strength training    PT Home Exercise Plan  None issued at this time. Pt encouraged to continue current home program    Consulted and Agree with Plan of Care  Patient       Patient will benefit from skilled therapeutic intervention in order to improve the following deficits and impairments:  Decreased strength, Difficulty walking, Decreased balance  Visit Diagnosis: Muscle weakness  (generalized)  Unsteadiness on feet     Problem List Patient Active Problem List   Diagnosis Date Noted  . Mixed hyperlipidemia 09/21/2017  . Aphasia 05/18/2017  . CAD (coronary artery disease) 05/18/2017  . History of stroke with residual deficit 05/18/2017  . 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 Urinary incontinence 04/27/2015  . Anxiety, generalized 04/07/2015  . History of BCG vaccination 04/07/2015  . GERD (gastroesophageal reflux disease) 03/04/2015  . Depression 02/25/2015  . Degeneration of intervertebral disc of lumbar region 02/23/2015  . Neuritis or radiculitis due to rupture of lumbar intervertebral disc 02/23/2015  . Lumbar canal stenosis 02/23/2015  . Hypertension 12/02/2014  . Diabetes mellitus without complication ([place]) 76/28/3151  . Benign essential tremor 03/13/2014  . Imbalance 03/13/2014  . Malignant neoplasm of prostate ([place]) 10/07/2012  . ED (erectile dysfunction) of organic origin 02/09/2012   Phillips Grout PT, DPT   Cammi Consalvo 10/08/2017, 4:44 PM  [place] MAIN [facility] SERVICES [address] [place], [place], [address] Phone: [phone]   Fax:  [phone]  Name: Todd [name] MRN: [id number] Date of Birth: 08-21-1930

## 2017-10-08 NOTE — Progress Notes (Signed)
Lupron IM Injection   Due to Prostate Cancer patient is present today for a Lupron Injection.  Medication: Lupron 6 month Dose: 45 mg  Location: right upper outer buttocks Lot: [id number] Exp: 11/13/2019  Patient tolerated well, no complications were noted  Performed by: Elberta Leatherwood, CMA  Follow up: 6 months

## 2017-10-08 NOTE — Therapy (Signed)
[place] MAIN [facility] SERVICES [address] [place], [place], [address] Phone: [phone]   Fax:  [phone]  Speech Language Pathology Treatment  Patient Details  Name: Todd [name] MRN: [id number] Date of Birth: [birth year] Referring Provider: Valerie Roys    Encounter Date: 10/08/2017  End of Session - 10/08/17 1708    Visit Number  4    Number of Visits  17    Date for SLP Re-Evaluation  11/02/17    SLP Start Time  1500    SLP Stop Time   1550    SLP Time Calculation (min)  50 min    Activity Tolerance  Patient tolerated treatment well       Past Medical History:  Diagnosis Date  . Anxiety   . Aortic regurgitation    a. 03/2017 Echo: Mod AI; b. 05/2017 Echo: Triv AI.  [name] Kitchen Arthritis    osteoarthritis-left knee  . CAD (coronary artery disease)    a. Reported h/o cath ~ 2008 - ? small vessel dzs. No PCI performed; b. 03/2017 MV: EF 45-54%, no ischemia/infarct.  . Depression   . Diabetes mellitus without complication ([place])   . Diastolic dysfunction    a. 03/2017 Echo: EF 55-65%; b. 05/2017 Echo: EF 55-60%, no rwma, Gr1 DD. Triv AI.  Mildly dil LA.  . ED (erectile dysfunction)   . Elevated troponin    a. chronic mild elevation - 0.03->0.04.  [name] Kitchen GERD (gastroesophageal reflux disease)   . Glaucoma   . Hyperlipidemia   . Hypertension   . Iron deficiency anemia due to chronic blood loss   . Left pontine stroke w/ cerebrovascular disease(HCC)    a. 05/2017 MRI/A: acute to subacute L pontine infarct. Chronic left thalamic lacunar infarct. Severe basilar artery stenosis w/ radiographic string sign of the distal 1/3. Mild to moderate L PCA P2 segment stenosis; b. 05/2017 Carotid U/S: <50% bilat ICA stenosis.  . Lumbago    Lumbosacral Neuritis  . Prostate cancer [facility])     Past Surgical History:  Procedure Laterality Date  . CARPAL TUNNEL RELEASE    . REPLACEMENT TOTAL KNEE BILATERAL      There were no vitals filed for this  visit.  Subjective Assessment - 10/08/17 1707    Subjective  Easily frustrated with difficuties expressing himself            ADULT SLP TREATMENT - 10/08/17 0001      General Information   Behavior/Cognition  Alert;Cooperative;Pleasant mood;Impulsive    HPI   84 year old man S/P left pontine stroke 05/17/2017.  The patient received rehab services, including speech therapy, at a SNF (05/21/2017 - 06/22/2017) and via home health (06/22/2017 - 08/09/2017).  Past medical history includes vascular dementia.       Treatment Provided   Treatment provided  Cognitive-Linquistic      Pain Assessment   Pain Assessment  No/denies pain      Cognitive-Linquistic Treatment   Treatment focused on  Aphasia;Apraxia    Skilled Treatment  Name pictured objects with 95% accuracy and answer related questions RE: picture with 75% accuracy.  Errors include phonemic paraphasia, perseveration, and blockage.  Patient able to relate simple story with min assist to clarify, but could not relate a complete complex memory from his past.      Assessment / Recommendations / Plan   Plan  Continue with current plan of care      Progression Toward Goals   Progression toward  goals  Progressing toward goals       SLP Education - 10/08/17 1708    Education provided  Yes    Education Details  slow down and be more relaxed    Person(s) Educated  Patient    Methods  Explanation    Comprehension  Verbalized understanding         SLP Long Term Goals - 09/28/17 1600      SLP LONG TERM GOAL #1   Title  Patient will read aloud or repeat words/phrases/sentences, maintaining phonemic accuracy, with 80% accuracy/fluency.      Time  8    Period  Weeks    Status  On-going    Target Date  11/02/17      SLP LONG TERM GOAL #2   Title  Patient will complete semantic feature word finding tasks with 80% accuracy.    Time  8    Period  Weeks    Status  On-going    Target Date  11/02/17      SLP LONG TERM GOAL #3    Title  Patient will generate grammatical and cogent phrases to complete simple/concrete linguistic task with 80% accuracy.    Time  8    Period  Weeks    Status  On-going    Target Date  11/02/17      SLP LONG TERM GOAL #4   Title  Patient will complete multi-unit processing tasks with 80% accuracy without the need of repetition of task instructions or significant delays in responding.    Time  8    Period  Weeks    Status  On-going    Target Date  11/02/17       Plan - 10/08/17 1708    Clinical Impression Statement  The patient has improved naming, much easier and fluent for him.  He continues to struggle with the apraxia and perseveration in less structured contexts.  He is responsive to cues that help him calm and respond to cues given.    Speech Therapy Frequency  2x / week    Duration  Other (comment)    Treatment/Interventions  Language facilitation;Cueing hierarchy;SLP instruction and feedback;Multimodal communcation approach;Patient/family education    Potential to Achieve Goals  Fair    Potential Considerations  Ability to learn/carryover information;Pain level;Family/community support;Co-morbidities;Previous level of function;Cooperation/participation level;Severity of impairments;Medical prognosis    Consulted and Agree with Plan of Care  Patient       Patient will benefit from skilled therapeutic intervention in order to improve the following deficits and impairments:   Apraxia following nontraumatic intracerebral hemorrhage  Aphasia    Problem List Patient Active Problem List   Diagnosis Date Noted  . Mixed hyperlipidemia 09/21/2017  . Aphasia 05/18/2017  . CAD (coronary artery disease) 05/18/2017  . History of stroke with residual deficit 05/18/2017  . 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 Urinary incontinence 04/27/2015  . Anxiety, generalized 04/07/2015  . History of BCG vaccination 04/07/2015  . GERD (gastroesophageal reflux disease) 03/04/2015   . Depression 02/25/2015  . Degeneration of intervertebral disc of lumbar region 02/23/2015  . Neuritis or radiculitis due to rupture of lumbar intervertebral disc 02/23/2015  . Lumbar canal stenosis 02/23/2015  . Hypertension 12/02/2014  . Diabetes mellitus without complication ([place]) 99/37/1696  . Benign essential tremor 03/13/2014  . Imbalance 03/13/2014  . Malignant neoplasm of prostate ([place]) 10/07/2012  . ED (erectile dysfunction) of organic origin 02/09/2012   Leroy Sea, MS/CCC- SLP  Lou Miner 10/08/2017, 5:09 PM  [place] MAIN [facility] SERVICES [address] [place], [place], [address] Phone: [phone]   Fax:  [phone]   Name: Francisco Ostrovsky MRN: [id number] Date of Birth: 1931-03-10

## 2017-10-10 ENCOUNTER — Ambulatory Visit: Payer: PPO

## 2017-10-10 ENCOUNTER — Ambulatory Visit: Payer: Self-pay | Admitting: *Deleted

## 2017-10-10 ENCOUNTER — Ambulatory Visit: Payer: PPO | Admitting: Speech Pathology

## 2017-10-10 DIAGNOSIS — M6281 Muscle weakness (generalized): Secondary | ICD-10-CM

## 2017-10-10 DIAGNOSIS — R2681 Unsteadiness on feet: Secondary | ICD-10-CM

## 2017-10-10 DIAGNOSIS — R4701 Aphasia: Secondary | ICD-10-CM

## 2017-10-10 DIAGNOSIS — I69190 Apraxia following nontraumatic intracerebral hemorrhage: Secondary | ICD-10-CM | POA: Diagnosis not present

## 2017-10-10 NOTE — Therapy (Signed)
[place] MAIN [facility] SERVICES [address] [place], [place], [address] Phone: [phone]   Fax:  [phone]  Physical Therapy Treatment  Patient Details  Name: Todd [name] MRN: [id number] Date of Birth: 11-18-1930 Referring Provider: Park Liter   Encounter Date: 10/10/2017  PT End of Session - 10/10/17 1614    Visit Number  3    Number of Visits  17    Date for PT Re-Evaluation  11/26/17    Authorization Type  progress note 3/10    PT Start Time  1608    PT Stop Time  1646    PT Time Calculation (min)  38 min    Equipment Utilized During Treatment  Gait belt    Activity Tolerance  Patient tolerated treatment well    Behavior During Therapy  [facility] for tasks assessed/performed       Past Medical History:  Diagnosis Date  . Anxiety   . Aortic regurgitation    a. 03/2017 Echo: Mod AI; b. 05/2017 Echo: Triv AI.  [name] Kitchen Arthritis    osteoarthritis-left knee  . CAD (coronary artery disease)    a. Reported h/o cath ~ 2008 - ? small vessel dzs. No PCI performed; b. 03/2017 MV: EF 45-54%, no ischemia/infarct.  . Depression   . Diabetes mellitus without complication ([place])   . Diastolic dysfunction    a. 03/2017 Echo: EF 55-65%; b. 05/2017 Echo: EF 55-60%, no rwma, Gr1 DD. Triv AI.  Mildly dil LA.  . ED (erectile dysfunction)   . Elevated troponin    a. chronic mild elevation - 0.03->0.04.  [name] Kitchen GERD (gastroesophageal reflux disease)   . Glaucoma   . Hyperlipidemia   . Hypertension   . Iron deficiency anemia due to chronic blood loss   . Left pontine stroke w/ cerebrovascular disease(HCC)    a. 05/2017 MRI/A: acute to subacute L pontine infarct. Chronic left thalamic lacunar infarct. Severe basilar artery stenosis w/ radiographic string sign of the distal 1/3. Mild to moderate L PCA P2 segment stenosis; b. 05/2017 Carotid U/S: <50% bilat ICA stenosis.  . Lumbago    Lumbosacral Neuritis  . Prostate cancer [facility])     Past Surgical History:   Procedure Laterality Date  . CARPAL TUNNEL RELEASE    . REPLACEMENT TOTAL KNEE BILATERAL      There were no vitals filed for this visit.  Subjective Assessment - 10/10/17 1611    Subjective  Pt reports that he is doing well today. Denies shortness of breath upon arrival. No specific questions or concerns.     Pertinent History  84 year old man S/P left pontine stroke 05/17/2017.  The patient received rehab services, including speech therapy, at a SNF (05/21/2017 - 06/22/2017) and via home health (06/22/2017 - 08/09/2017).  Past medical history includes vascular dementia.  Hx of back pain. Pt with significant expressive aphasia.    Limitations  Walking    How long can you walk comfortably?  Very short distances. Pt becomes winded easily. Pt normally only walks approximately 10m at a time per daughter.     Currently in Pain?  No/denies           TREATMENT  Ther-ex  NuStep L3 x 5 minutes during history (unbilled); Seated marches with 3# ankle weights 2 x 15 bilateral; Seated clams with manual resistance 2 x 10; Seated adductor squeeze 2 x 10; Seated LAQ with 3# ankle weights 2 x 10; Seated HS curls with manual resistance 2  x 10; Seated heel raises 2 x 10; Sit to stand from elevated mat table without UE support 2 x 10;                       PT Education - 10/10/17 1612    Education provided  Yes    Education Details  exercise form/technique    Person(s) Educated  Patient    Methods  Explanation    Comprehension  Verbalized understanding       PT Short Term Goals - 10/02/17 2059      PT SHORT TERM GOAL #1   Title  Pt will be independent with HEP in order to improve strength and balance in order to decrease fall risk and improve function at home and work.     Time  8    Period  Weeks    Status  New    Target Date  11/27/17        PT Long Term Goals - 10/02/17 2059      PT LONG TERM GOAL #1   Title  Pt will improve BERG by at least 3 points in order  to demonstrate clinically significant improvement in balance.     Baseline  10/01/17: 24/56    Time  8    Period  Weeks    Status  New    Target Date  11/26/17      PT LONG TERM GOAL #2   Title  Pt will decrease TUG to below 14 seconds in order to demonstrate decreased fall risk     Baseline  10/01/17: 40.1s    Time  8    Period  Weeks    Status  New    Target Date  11/26/17            Plan - 10/10/17 1614    Clinical Impression Statement  Pt denies any shortness of breath during session. Vitals within acceptable limits upon arrival. Pt is able to complete all seated exercises but has to use the restroom at the end of the session which cuts session short. Pt encouraged to follow-up as scheduled    Rehab Potential  Fair    Clinical Impairments Affecting Rehab Potential  Positive: acuity, Negative: lack of motivation    PT Frequency  2x / week    PT Duration  8 weeks    PT Treatment/Interventions  ADLs/Self Care Home Management;Aquatic Therapy;Biofeedback;Canalith Repostioning;Cryotherapy;Electrical Stimulation;Traction;Ultrasound;DME Instruction;Gait Scientist, forensic;Therapeutic activities;Therapeutic exercise;Balance training;Neuromuscular re-education;Patient/family education;Manual techniques;Vestibular;Cognitive remediation;Energy conservation;Dry needling    PT Next Visit Plan  Review HEP (pt to bring), initiate balance and strength training    PT Home Exercise Plan  None issued at this time. Pt encouraged to continue current home program    Consulted and Agree with Plan of Care  Patient       Patient will benefit from skilled therapeutic intervention in order to improve the following deficits and impairments:  Decreased strength, Difficulty walking, Decreased balance  Visit Diagnosis: Muscle weakness (generalized)  Unsteadiness on feet     Problem List Patient Active Problem List   Diagnosis Date Noted  . Mixed hyperlipidemia 09/21/2017  . Aphasia 05/18/2017   . CAD (coronary artery disease) 05/18/2017  . History of stroke with residual deficit 05/18/2017  . 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 Urinary incontinence 04/27/2015  . Anxiety, generalized 04/07/2015  . History of BCG vaccination 04/07/2015  . GERD (gastroesophageal reflux disease) 03/04/2015  . Depression 02/25/2015  .  Degeneration of intervertebral disc of lumbar region 02/23/2015  . Neuritis or radiculitis due to rupture of lumbar intervertebral disc 02/23/2015  . Lumbar canal stenosis 02/23/2015  . Hypertension 12/02/2014  . Diabetes mellitus without complication ([place]) 07/16/1733  . Benign essential tremor 03/13/2014  . Imbalance 03/13/2014  . Malignant neoplasm of prostate ([place]) 10/07/2012  . ED (erectile dysfunction) of organic origin 02/09/2012   Phillips Grout PT, DPT   Neala Miggins 10/11/2017, 3:55 PM  [place] MAIN [facility] SERVICES [address] [place], [place], [address] Phone: [phone]   Fax:  [phone]  Name: Todd [name] MRN: [id number] Date of Birth: April 23, 1931

## 2017-10-11 ENCOUNTER — Encounter: Payer: Self-pay | Admitting: Speech Pathology

## 2017-10-11 NOTE — Therapy (Signed)
[place] MAIN [facility] SERVICES [address] [place], [place], [address] Phone: [phone]   Fax:  [phone]  Speech Language Pathology Treatment  Patient Details  Name: Todd [name] MRN: [id number] Date of Birth: 03-25-1931 Referring Provider: Valerie Roys    Encounter Date: 10/10/2017  End of Session - 10/11/17 0759    Visit Number  5    Number of Visits  17    Date for SLP Re-Evaluation  11/02/17    SLP Start Time  1500    SLP Stop Time   1550    SLP Time Calculation (min)  50 min    Activity Tolerance  Patient tolerated treatment well       Past Medical History:  Diagnosis Date  . Anxiety   . Aortic regurgitation    a. 03/2017 Echo: Mod AI; b. 05/2017 Echo: Triv AI.  [name] Kitchen Arthritis    osteoarthritis-left knee  . CAD (coronary artery disease)    a. Reported h/o cath ~ 2008 - ? small vessel dzs. No PCI performed; b. 03/2017 MV: EF 45-54%, no ischemia/infarct.  . Depression   . Diabetes mellitus without complication ([place])   . Diastolic dysfunction    a. 03/2017 Echo: EF 55-65%; b. 05/2017 Echo: EF 55-60%, no rwma, Gr1 DD. Triv AI.  Mildly dil LA.  . ED (erectile dysfunction)   . Elevated troponin    a. chronic mild elevation - 0.03->0.04.  [name] Kitchen GERD (gastroesophageal reflux disease)   . Glaucoma   . Hyperlipidemia   . Hypertension   . Iron deficiency anemia due to chronic blood loss   . Left pontine stroke w/ cerebrovascular disease(HCC)    a. 05/2017 MRI/A: acute to subacute L pontine infarct. Chronic left thalamic lacunar infarct. Severe basilar artery stenosis w/ radiographic string sign of the distal 1/3. Mild to moderate L PCA P2 segment stenosis; b. 05/2017 Carotid U/S: <50% bilat ICA stenosis.  . Lumbago    Lumbosacral Neuritis  . Prostate cancer [facility])     Past Surgical History:  Procedure Laterality Date  . CARPAL TUNNEL RELEASE    . REPLACEMENT TOTAL KNEE BILATERAL      There were no vitals filed for this  visit.  Subjective Assessment - 10/11/17 0758    Subjective  Easily frustrated with difficuties expressing himself            ADULT SLP TREATMENT - 10/11/17 0001      General Information   Behavior/Cognition  Alert;Cooperative;Pleasant mood;Impulsive    HPI   84 year old man S/P left pontine stroke 05/17/2017.  The patient received rehab services, including speech therapy, at a SNF (05/21/2017 - 06/22/2017) and via home health (06/22/2017 - 08/09/2017).  Past medical history includes vascular dementia.       Treatment Provided   Treatment provided  Cognitive-Linquistic      Pain Assessment   Pain Assessment  No/denies pain      Cognitive-Linquistic Treatment   Treatment focused on  Aphasia;Apraxia    Skilled Treatment  Name pictured objects with 95% accuracy and answer related questions RE: picture with 75% accuracy.  Errors include phonemic paraphasia, perseveration, and blockage.  Patient able to relate simple story with min assist to clarify, but could not relate a complete complex memory from his past.      Assessment / Recommendations / Plan   Plan  Continue with current plan of care      Progression Toward Goals   Progression toward  goals  Progressing toward goals       SLP Education - 10/11/17 0758    Education provided  Yes    Education Details  Try not to get stuck when words do not come out- keep on going    Person(s) Educated  Patient    Methods  Explanation    Comprehension  Verbalized understanding         SLP Long Term Goals - 09/28/17 1600      SLP [place] #1   Title  Patient will read aloud or repeat words/phrases/sentences, maintaining phonemic accuracy, with 80% accuracy/fluency.      Time  8    Period  Weeks    Status  On-going    Target Date  11/02/17      SLP LONG TERM GOAL #2   Title  Patient will complete semantic feature word finding tasks with 80% accuracy.    Time  8    Period  Weeks    Status  On-going    Target Date  11/02/17       SLP LONG TERM GOAL #3   Title  Patient will generate grammatical and cogent phrases to complete simple/concrete linguistic task with 80% accuracy.    Time  8    Period  Weeks    Status  On-going    Target Date  11/02/17      SLP LONG TERM GOAL #4   Title  Patient will complete multi-unit processing tasks with 80% accuracy without the need of repetition of task instructions or significant delays in responding.    Time  8    Period  Weeks    Status  On-going    Target Date  11/02/17       Plan - 10/11/17 0759    Clinical Impression Statement  The patient has improved naming, much easier and fluent for him.  He continues to struggle with the apraxia and perseveration in less structured contexts.  He is responsive to cues that help him calm and respond to cues given.    Speech Therapy Frequency  2x / week    Duration  Other (comment)    Treatment/Interventions  Language facilitation;Cueing hierarchy;SLP instruction and feedback;Multimodal communcation approach;Patient/family education    Potential to Achieve Goals  Fair    Potential Considerations  Ability to learn/carryover information;Pain level;Family/community support;Co-morbidities;Previous level of function;Cooperation/participation level;Severity of impairments;Medical prognosis    Consulted and Agree with Plan of Care  Patient       Patient will benefit from skilled therapeutic intervention in order to improve the following deficits and impairments:   Apraxia following nontraumatic intracerebral hemorrhage  Aphasia    Problem List Patient Active Problem List   Diagnosis Date Noted  . Mixed hyperlipidemia 09/21/2017  . Aphasia 05/18/2017  . CAD (coronary artery disease) 05/18/2017  . History of stroke with residual deficit 05/18/2017  . 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 Urinary incontinence 04/27/2015  . Anxiety, generalized 04/07/2015  . History of BCG vaccination 04/07/2015  . GERD  (gastroesophageal reflux disease) 03/04/2015  . Depression 02/25/2015  . Degeneration of intervertebral disc of lumbar region 02/23/2015  . Neuritis or radiculitis due to rupture of lumbar intervertebral disc 02/23/2015  . Lumbar canal stenosis 02/23/2015  . Hypertension 12/02/2014  . Diabetes mellitus without complication ([place]) 51/88/4166  . Benign essential tremor 03/13/2014  . Imbalance 03/13/2014  . Malignant neoplasm of prostate ([place]) 10/07/2012  . ED (erectile dysfunction) of organic origin  02/09/2012   Leroy Sea, MS/CCC- SLP  Lou Miner 10/11/2017, 8:00 AM  [place] MAIN [facility] SERVICES [address] [place], [place], [address] Phone: [phone]   Fax:  [phone]   Name: Todd [name] MRN: [id number] Date of Birth: May 30, 1931

## 2017-10-12 ENCOUNTER — Telehealth: Payer: Self-pay | Admitting: Urology

## 2017-10-12 NOTE — Telephone Encounter (Signed)
Spoke to patient's daughter. Informed her that he did have Lupron injection. She was notified that the Lupron is working. He may need to check depression medication.

## 2017-10-12 NOTE — Telephone Encounter (Signed)
Pt's daughter, Tito Ausmus, called and wants to know if pt had lupron shot at last visit on 5/6.  His testosterone is a 9 and pt is extremely tired, no energy and depressed.  Please give daughter a call [phone] She is on DPR.

## 2017-10-15 ENCOUNTER — Ambulatory Visit: Payer: PPO

## 2017-10-15 VITALS — BP 137/71 | HR 68

## 2017-10-15 DIAGNOSIS — I69190 Apraxia following nontraumatic intracerebral hemorrhage: Secondary | ICD-10-CM | POA: Diagnosis not present

## 2017-10-15 DIAGNOSIS — M6281 Muscle weakness (generalized): Secondary | ICD-10-CM

## 2017-10-15 DIAGNOSIS — R2681 Unsteadiness on feet: Secondary | ICD-10-CM

## 2017-10-15 NOTE — Therapy (Signed)
[place] MAIN [facility] SERVICES [address] [place], [place], [address] Phone: [phone]   Fax:  [phone]  Physical Therapy Treatment  Patient Details  Name: Todd [name] MRN: [id number] Date of Birth: 11-Feb-1931 Referring Provider: Park Liter   Encounter Date: 10/15/2017  PT End of Session - 10/15/17 1528    Visit Number  4    Number of Visits  17    Date for PT Re-Evaluation  11/26/17    Authorization Type  progress note 4/10    Authorization Time Period  goals 10/01/17    PT Start Time  1520    PT Stop Time  1600    PT Time Calculation (min)  40 min    Equipment Utilized During Treatment  Gait belt    Activity Tolerance  Patient tolerated treatment well    Behavior During Therapy  [facility] for tasks assessed/performed       Past Medical History:  Diagnosis Date  . Anxiety   . Aortic regurgitation    a. 03/2017 Echo: Mod AI; b. 05/2017 Echo: Triv AI.  [name] Kitchen Arthritis    osteoarthritis-left knee  . CAD (coronary artery disease)    a. Reported h/o cath ~ 2008 - ? small vessel dzs. No PCI performed; b. 03/2017 MV: EF 45-54%, no ischemia/infarct.  . Depression   . Diabetes mellitus without complication ([place])   . Diastolic dysfunction    a. 03/2017 Echo: EF 55-65%; b. 05/2017 Echo: EF 55-60%, no rwma, Gr1 DD. Triv AI.  Mildly dil LA.  . ED (erectile dysfunction)   . Elevated troponin    a. chronic mild elevation - 0.03->0.04.  [name] Kitchen GERD (gastroesophageal reflux disease)   . Glaucoma   . Hyperlipidemia   . Hypertension   . Iron deficiency anemia due to chronic blood loss   . Left pontine stroke w/ cerebrovascular disease(HCC)    a. 05/2017 MRI/A: acute to subacute L pontine infarct. Chronic left thalamic lacunar infarct. Severe basilar artery stenosis w/ radiographic string sign of the distal 1/3. Mild to moderate L PCA P2 segment stenosis; b. 05/2017 Carotid U/S: <50% bilat ICA stenosis.  . Lumbago    Lumbosacral Neuritis  . Prostate  cancer [facility])     Past Surgical History:  Procedure Laterality Date  . CARPAL TUNNEL RELEASE    . REPLACEMENT TOTAL KNEE BILATERAL      Vitals:   10/15/17 1524  BP: 137/71  Pulse: 68  SpO2: 100%    Subjective Assessment - 10/15/17 1524    Subjective  Pt reports that he is doing well today. Denies shortness of breath or pain upon arrival. No specific questions or concerns. Denies any recent falls or changes in his health    Pertinent History  84 year old man S/P left pontine stroke 05/17/2017.  The patient received rehab services, including speech therapy, at a SNF (05/21/2017 - 06/22/2017) and via home health (06/22/2017 - 08/09/2017).  Past medical history includes vascular dementia.  Hx of back pain. Pt with significant expressive aphasia.    How long can you walk comfortably?  Very short distances. Pt becomes winded easily. Pt normally only walks approximately 24m at a time per daughter.     Currently in Pain?  No/denies           TREATMENT  Ther-ex NuStep L2/3 x 5 minutes during history (unbilled); Quantum leg press 120# 2 x 10; Seated marches with 3# ankle weights 2 x 15 bilateral; Seated clams with  manual resistance 2 x 10; Seated adductor squeeze 2 x 10; Seated LAQ with 3# ankle weights 2 x 10; Seated HS curls with manual resistance 2 x 10; Seated heel raises 2 x 10; Sit to stand from elevated mat table without UE support x 10;                     PT Education - 10/15/17 1526    Education provided  Yes    Education Details  exercise form/technique    Person(s) Educated  Patient    Methods  Explanation    Comprehension  Verbalized understanding       PT Short Term Goals - 10/02/17 2059      PT SHORT TERM GOAL #1   Title  Pt will be independent with HEP in order to improve strength and balance in order to decrease fall risk and improve function at home and work.     Time  8    Period  Weeks    Status  New    Target Date  11/27/17         PT Long Term Goals - 10/02/17 2059      PT LONG TERM GOAL #1   Title  Pt will improve BERG by at least 3 points in order to demonstrate clinically significant improvement in balance.     Baseline  10/01/17: 24/56    Time  8    Period  Weeks    Status  New    Target Date  11/26/17      PT LONG TERM GOAL #2   Title  Pt will decrease TUG to below 14 seconds in order to demonstrate decreased fall risk     Baseline  10/01/17: 40.1s    Time  8    Period  Weeks    Status  New    Target Date  11/26/17            Plan - 10/15/17 1529    Clinical Impression Statement  Pt denies any shortness of breath during session. Vitals within acceptable limits upon arrival. Pt is able to complete all seated exercises during session and is able to use the leg press today with a fair amount of resistance. Pt encouraged to follow-up as scheduled.    Rehab Potential  Fair    Clinical Impairments Affecting Rehab Potential  Positive: acuity, Negative: lack of motivation    PT Frequency  2x / week    PT Duration  8 weeks    PT Treatment/Interventions  ADLs/Self Care Home Management;Aquatic Therapy;Biofeedback;Canalith Repostioning;Cryotherapy;Electrical Stimulation;Traction;Ultrasound;DME Instruction;Gait Scientist, forensic;Therapeutic activities;Therapeutic exercise;Balance training;Neuromuscular re-education;Patient/family education;Manual techniques;Vestibular;Cognitive remediation;Energy conservation;Dry needling    PT Next Visit Plan  Review HEP (pt to bring), initiate balance and strength training    PT Home Exercise Plan  None issued at this time. Pt encouraged to continue current home program    Consulted and Agree with Plan of Care  Patient       Patient will benefit from skilled therapeutic intervention in order to improve the following deficits and impairments:  Decreased strength, Difficulty walking, Decreased balance  Visit Diagnosis: Muscle weakness (generalized)  Unsteadiness  on feet     Problem List Patient Active Problem List   Diagnosis Date Noted  . Mixed hyperlipidemia 09/21/2017  . Aphasia 05/18/2017  . CAD (coronary artery disease) 05/18/2017  . History of stroke with residual deficit 05/18/2017  . 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 Urinary  incontinence 04/27/2015  . Anxiety, generalized 04/07/2015  . History of BCG vaccination 04/07/2015  . GERD (gastroesophageal reflux disease) 03/04/2015  . Depression 02/25/2015  . Degeneration of intervertebral disc of lumbar region 02/23/2015  . Neuritis or radiculitis due to rupture of lumbar intervertebral disc 02/23/2015  . Lumbar canal stenosis 02/23/2015  . Hypertension 12/02/2014  . Diabetes mellitus without complication ([place]) 31/51/7616  . Benign essential tremor 03/13/2014  . Imbalance 03/13/2014  . Malignant neoplasm of prostate ([place]) 10/07/2012  . ED (erectile dysfunction) of organic origin 02/09/2012   Phillips Grout PT, DPT   Huprich,Jason 10/16/2017, 12:04 PM  [place] MAIN [facility] SERVICES [address] [place], [place], [address] Phone: [phone]   Fax:  [phone]  Name: Mory Herrman MRN: [id number] Date of Birth: 1931-03-12

## 2017-10-16 ENCOUNTER — Telehealth: Payer: Self-pay | Admitting: Family Medicine

## 2017-10-16 NOTE — Telephone Encounter (Signed)
RX placed at front desk for pickup. Message left for [place].

## 2017-10-16 NOTE — Telephone Encounter (Signed)
Copied from [place] [id number]. Topic: Quick Communication - See Telephone Encounter >> Oct 16, 2017  9:55 AM Neva Seat wrote: Pt's daughter is calling to get a prescription written for pt's diabetic shoes.  Medicare will pay once the Rx has been written for diabetic shoes.  If away from desk - call cell - [employer]

## 2017-10-16 NOTE — Telephone Encounter (Signed)
RX written and placed in Dr. Exie Parody for review and signature.

## 2017-10-17 ENCOUNTER — Ambulatory Visit: Payer: PPO | Admitting: Speech Pathology

## 2017-10-17 ENCOUNTER — Ambulatory Visit: Payer: PPO | Admitting: Physical Therapy

## 2017-10-17 DIAGNOSIS — I69190 Apraxia following nontraumatic intracerebral hemorrhage: Secondary | ICD-10-CM | POA: Diagnosis not present

## 2017-10-17 DIAGNOSIS — R4701 Aphasia: Secondary | ICD-10-CM

## 2017-10-18 ENCOUNTER — Encounter: Payer: Self-pay | Admitting: Speech Pathology

## 2017-10-18 NOTE — Therapy (Signed)
[place] MAIN [facility] SERVICES [address] [place], [place], [address] Phone: [phone]   Fax:  [phone]  Speech Language Pathology Treatment  Patient Details  Name: Todd [name] MRN: [id number] Date of Birth: 1930/12/01 Referring Provider: Valerie Roys    Encounter Date: 10/17/2017  End of Session - 10/18/17 0920    Visit Number  6    Number of Visits  17    Date for SLP Re-Evaluation  11/02/17    SLP Start Time  1400    SLP Stop Time   1451    SLP Time Calculation (min)  51 min    Activity Tolerance  Patient tolerated treatment well       Past Medical History:  Diagnosis Date  . Anxiety   . Aortic regurgitation    a. 03/2017 Echo: Mod AI; b. 05/2017 Echo: Triv AI.  [name] Kitchen Arthritis    osteoarthritis-left knee  . CAD (coronary artery disease)    a. Reported h/o cath ~ 2008 - ? small vessel dzs. No PCI performed; b. 03/2017 MV: EF 45-54%, no ischemia/infarct.  . Depression   . Diabetes mellitus without complication ([place])   . Diastolic dysfunction    a. 03/2017 Echo: EF 55-65%; b. 05/2017 Echo: EF 55-60%, no rwma, Gr1 DD. Triv AI.  Mildly dil LA.  . ED (erectile dysfunction)   . Elevated troponin    a. chronic mild elevation - 0.03->0.04.  [name] Kitchen GERD (gastroesophageal reflux disease)   . Glaucoma   . Hyperlipidemia   . Hypertension   . Iron deficiency anemia due to chronic blood loss   . Left pontine stroke w/ cerebrovascular disease(HCC)    a. 05/2017 MRI/A: acute to subacute L pontine infarct. Chronic left thalamic lacunar infarct. Severe basilar artery stenosis w/ radiographic string sign of the distal 1/3. Mild to moderate L PCA P2 segment stenosis; b. 05/2017 Carotid U/S: <50% bilat ICA stenosis.  . Lumbago    Lumbosacral Neuritis  . Prostate cancer [facility])     Past Surgical History:  Procedure Laterality Date  . CARPAL TUNNEL RELEASE    . REPLACEMENT TOTAL KNEE BILATERAL      There were no vitals filed for this  visit.  Subjective Assessment - 10/18/17 0920    Subjective  Easily frustrated with difficuties expressing himself            ADULT SLP TREATMENT - 10/18/17 0001      General Information   Behavior/Cognition  Alert;Cooperative;Pleasant mood;Impulsive    HPI   84 year old man S/P left pontine stroke 05/17/2017.  The patient received rehab services, including speech therapy, at a SNF (05/21/2017 - 06/22/2017) and via home health (06/22/2017 - 08/09/2017).  Past medical history includes vascular dementia.       Treatment Provided   Treatment provided  Cognitive-Linquistic      Pain Assessment   Pain Assessment  No/denies pain      Cognitive-Linquistic Treatment   Treatment focused on  Aphasia;Apraxia    Skilled Treatment  Name pictured objects with 95% accuracy and answer related questions RE: picture with 75% accuracy.  Errors include phonemic paraphasia, perseveration, and blockage.  Generate simple phrase to remark on complex pictures with 75% accuracy.  Patient able to relate simple story with min assist to clarify, but could not relate a complete complex memory from his past.      Assessment / Recommendations / [place] with current plan of care  Progression Toward Goals   Progression toward goals  Progressing toward goals       SLP Education - 10/18/17 0920    Education provided  Yes    Education Details  Speak slowly, simplify message    Person(s) Educated  Patient    Methods  Explanation    Comprehension  Verbalized understanding;Need further instruction         SLP Long Term Goals - 09/28/17 1600      SLP LONG TERM GOAL #1   Title  Patient will read aloud or repeat words/phrases/sentences, maintaining phonemic accuracy, with 80% accuracy/fluency.      Time  8    Period  Weeks    Status  On-going    Target Date  11/02/17      SLP LONG TERM GOAL #2   Title  Patient will complete semantic feature word finding tasks with 80% accuracy.    Time   8    Period  Weeks    Status  On-going    Target Date  11/02/17      SLP LONG TERM GOAL #3   Title  Patient will generate grammatical and cogent phrases to complete simple/concrete linguistic task with 80% accuracy.    Time  8    Period  Weeks    Status  On-going    Target Date  11/02/17      SLP LONG TERM GOAL #4   Title  Patient will complete multi-unit processing tasks with 80% accuracy without the need of repetition of task instructions or significant delays in responding.    Time  8    Period  Weeks    Status  On-going    Target Date  11/02/17       Plan - 10/18/17 2376    Clinical Impression Statement  The patient has improved naming, much easier and fluent for him.  He continues to struggle with the apraxia and perseveration in less structured contexts.  He is responsive to cues that help him calm and respond to cues given.    Speech Therapy Frequency  2x / week    Duration  Other (comment)    Treatment/Interventions  Language facilitation;Cueing hierarchy;SLP instruction and feedback;Multimodal communcation approach;Patient/family education    Potential to Achieve Goals  Fair    Potential Considerations  Ability to learn/carryover information;Pain level;Family/community support;Co-morbidities;Previous level of function;Cooperation/participation level;Severity of impairments;Medical prognosis    Consulted and Agree with Plan of Care  Patient       Patient will benefit from skilled therapeutic intervention in order to improve the following deficits and impairments:   Aphasia  Apraxia following nontraumatic intracerebral hemorrhage    Problem List Patient Active Problem List   Diagnosis Date Noted  . Mixed hyperlipidemia 09/21/2017  . Aphasia 05/18/2017  . CAD (coronary artery disease) 05/18/2017  . History of stroke with residual deficit 05/18/2017  . 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 Urinary incontinence 04/27/2015  . Anxiety, generalized  04/07/2015  . History of BCG vaccination 04/07/2015  . GERD (gastroesophageal reflux disease) 03/04/2015  . Depression 02/25/2015  . Degeneration of intervertebral disc of lumbar region 02/23/2015  . Neuritis or radiculitis due to rupture of lumbar intervertebral disc 02/23/2015  . Lumbar canal stenosis 02/23/2015  . Hypertension 12/02/2014  . Diabetes mellitus without complication ([place]) 28/31/5176  . Benign essential tremor 03/13/2014  . Imbalance 03/13/2014  . Malignant neoplasm of prostate ([place]) 10/07/2012  . ED (erectile dysfunction) of organic origin 02/09/2012  Leroy Sea, MS/CCC- SLP  Lou Miner 10/18/2017, 9:21 AM  [place] MAIN [facility] SERVICES [address] [place], [place], [address] Phone: [phone]   Fax:  [phone]   Name: Todd [name] MRN: [id number] Date of Birth: 18-Jan-1931

## 2017-10-19 DIAGNOSIS — G479 Sleep disorder, unspecified: Secondary | ICD-10-CM | POA: Diagnosis not present

## 2017-10-19 DIAGNOSIS — G250 Essential tremor: Secondary | ICD-10-CM | POA: Diagnosis not present

## 2017-10-19 DIAGNOSIS — F411 Generalized anxiety disorder: Secondary | ICD-10-CM | POA: Diagnosis not present

## 2017-10-19 DIAGNOSIS — I69359 Hemiplegia and hemiparesis following cerebral infarction affecting unspecified side: Secondary | ICD-10-CM | POA: Diagnosis not present

## 2017-10-19 DIAGNOSIS — F0150 Vascular dementia without behavioral disturbance: Secondary | ICD-10-CM | POA: Diagnosis not present

## 2017-10-19 DIAGNOSIS — F329 Major depressive disorder, single episode, unspecified: Secondary | ICD-10-CM | POA: Diagnosis not present

## 2017-10-19 DIAGNOSIS — G5603 Carpal tunnel syndrome, bilateral upper limbs: Secondary | ICD-10-CM | POA: Diagnosis not present

## 2017-10-19 DIAGNOSIS — Z8673 Personal history of transient ischemic attack (TIA), and cerebral infarction without residual deficits: Secondary | ICD-10-CM | POA: Diagnosis not present

## 2017-10-19 DIAGNOSIS — I69328 Other speech and language deficits following cerebral infarction: Secondary | ICD-10-CM | POA: Diagnosis not present

## 2017-10-22 ENCOUNTER — Ambulatory Visit: Payer: PPO | Admitting: Physical Therapy

## 2017-10-22 ENCOUNTER — Ambulatory Visit: Payer: PPO | Admitting: Speech Pathology

## 2017-10-22 DIAGNOSIS — I69190 Apraxia following nontraumatic intracerebral hemorrhage: Secondary | ICD-10-CM

## 2017-10-22 DIAGNOSIS — R4701 Aphasia: Secondary | ICD-10-CM

## 2017-10-23 ENCOUNTER — Encounter: Payer: Self-pay | Admitting: Speech Pathology

## 2017-10-23 NOTE — Therapy (Signed)
[place] MAIN [facility] SERVICES [address] [place], [place], [address] Phone: [phone]   Fax:  [phone]  Speech Language Pathology Treatment  Patient Details  Name: Todd [name] MRN: [id number] Date of Birth: [birth year] Referring Provider: Valerie Roys    Encounter Date: 10/22/2017  End of Session - 10/23/17 0941    Visit Number  7    Number of Visits  17    Date for SLP Re-Evaluation  11/02/17    SLP Start Time  1500    SLP Stop Time   1555    SLP Time Calculation (min)  55 min    Activity Tolerance  Patient tolerated treatment well       Past Medical History:  Diagnosis Date  . Anxiety   . Aortic regurgitation    a. 03/2017 Echo: Mod AI; b. 05/2017 Echo: Triv AI.  [name] Kitchen Arthritis    osteoarthritis-left knee  . CAD (coronary artery disease)    a. Reported h/o cath ~ 2008 - ? small vessel dzs. No PCI performed; b. 03/2017 MV: EF 45-54%, no ischemia/infarct.  . Depression   . Diabetes mellitus without complication ([place])   . Diastolic dysfunction    a. 03/2017 Echo: EF 55-65%; b. 05/2017 Echo: EF 55-60%, no rwma, Gr1 DD. Triv AI.  Mildly dil LA.  . ED (erectile dysfunction)   . Elevated troponin    a. chronic mild elevation - 0.03->0.04.  [name] Kitchen GERD (gastroesophageal reflux disease)   . Glaucoma   . Hyperlipidemia   . Hypertension   . Iron deficiency anemia due to chronic blood loss   . Left pontine stroke w/ cerebrovascular disease(HCC)    a. 05/2017 MRI/A: acute to subacute L pontine infarct. Chronic left thalamic lacunar infarct. Severe basilar artery stenosis w/ radiographic string sign of the distal 1/3. Mild to moderate L PCA P2 segment stenosis; b. 05/2017 Carotid U/S: <50% bilat ICA stenosis.  . Lumbago    Lumbosacral Neuritis  . Prostate cancer [facility])     Past Surgical History:  Procedure Laterality Date  . CARPAL TUNNEL RELEASE    . REPLACEMENT TOTAL KNEE BILATERAL      There were no vitals filed for this  visit.  Subjective Assessment - 10/23/17 0941    Subjective  Easily frustrated with difficuties expressing himself    Currently in Pain?  No/denies            ADULT SLP TREATMENT - 10/23/17 0001      General Information   Behavior/Cognition  Alert;Cooperative;Pleasant mood;Impulsive    HPI   84 year old man S/P left pontine stroke 05/17/2017.  The patient received rehab services, including speech therapy, at a SNF (05/21/2017 - 06/22/2017) and via home health (06/22/2017 - 08/09/2017).  Past medical history includes vascular dementia.       Treatment Provided   Treatment provided  Cognitive-Linquistic      Pain Assessment   Pain Assessment  No/denies pain      Cognitive-Linquistic Treatment   Treatment focused on  Aphasia;Apraxia    Skilled Treatment  Named what was happening in a picture correctly with 95% accuracy. Answered follow up questions in sentences about the picture or his own opinions relating to the picture 1 or 2 word. His longest sentence was 4 words. Errors include phonemic paraphasia, perseveration, and blockage. Was able to generate simple phrase to explain what was dangerous about a picture with 80% accuracy Was able to give an example of what  he would say to the person in the picture using phrases up to 4 words.      Assessment / Recommendations / Plan   Plan  Continue with current plan of care      Progression Toward Goals   Progression toward goals  Progressing toward goals       SLP Education - 10/23/17 0941    Education Details  Therapist encouraged him to stay calm when his speech is blocked and demonstrated how to use drawing to assist his communication when talking about sports. Pt. verbalized understanding.      Person(s) Educated  Patient    Methods  Explanation;Demonstration;Verbal cues    Comprehension  Verbalized understanding         SLP Long Term Goals - 09/28/17 1600      SLP LONG TERM GOAL #1   Title  Patient will read aloud or repeat  words/phrases/sentences, maintaining phonemic accuracy, with 80% accuracy/fluency.      Time  8    Period  Weeks    Status  On-going    Target Date  11/02/17      SLP LONG TERM GOAL #2   Title  Patient will complete semantic feature word finding tasks with 80% accuracy.    Time  8    Period  Weeks    Status  On-going    Target Date  11/02/17      SLP LONG TERM GOAL #3   Title  Patient will generate grammatical and cogent phrases to complete simple/concrete linguistic task with 80% accuracy.    Time  8    Period  Weeks    Status  On-going    Target Date  11/02/17      SLP LONG TERM GOAL #4   Title  Patient will complete multi-unit processing tasks with 80% accuracy without the need of repetition of task instructions or significant delays in responding.    Time  8    Period  Weeks    Status  On-going    Target Date  11/02/17       Plan - 10/23/17 0942    Clinical Impression Statement  Pt. is engaged and making progress. He becomes frustrated when his speech is blocked and often struggles to correctly articulate his words.    Speech Therapy Frequency  2x / week    Duration  Other (comment)    Treatment/Interventions  Language facilitation;Cueing hierarchy;SLP instruction and feedback;Multimodal communcation approach;Patient/family education    Potential to Achieve Goals  Fair    Potential Considerations  Ability to learn/carryover information;Pain level;Family/community support;Co-morbidities;Previous level of function;Cooperation/participation level;Severity of impairments;Medical prognosis    SLP Home Exercise Plan  To be determined    Consulted and Agree with Plan of Care  Patient       Patient will benefit from skilled therapeutic intervention in order to improve the following deficits and impairments:   Aphasia  Apraxia following nontraumatic intracerebral hemorrhage    Problem List Patient Active Problem List   Diagnosis Date Noted  . Mixed hyperlipidemia  09/21/2017  . Aphasia 05/18/2017  . CAD (coronary artery disease) 05/18/2017  . History of stroke with residual deficit 05/18/2017  . 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 Urinary incontinence 04/27/2015  . Anxiety, generalized 04/07/2015  . History of BCG vaccination 04/07/2015  . GERD (gastroesophageal reflux disease) 03/04/2015  . Depression 02/25/2015  . Degeneration of intervertebral disc of lumbar region 02/23/2015  . Neuritis or radiculitis due to  rupture of lumbar intervertebral disc 02/23/2015  . Lumbar canal stenosis 02/23/2015  . Hypertension 12/02/2014  . Diabetes mellitus without complication ([place]) 66/44/0347  . Benign essential tremor 03/13/2014  . Imbalance 03/13/2014  . Malignant neoplasm of prostate ([place]) 10/07/2012  . ED (erectile dysfunction) of organic origin 02/09/2012    Davis Gourd 10/23/2017, 9:45 AM  [place] MAIN [facility] SERVICES [address] [place], [place], [address] Phone: [phone]   Fax:  [phone]   Name: Todd [name] MRN: [id number] Date of Birth: 08-07-1930

## 2017-10-24 ENCOUNTER — Encounter: Payer: Self-pay | Admitting: Speech Pathology

## 2017-10-24 ENCOUNTER — Ambulatory Visit: Payer: PPO | Admitting: Speech Pathology

## 2017-10-24 ENCOUNTER — Ambulatory Visit: Payer: PPO

## 2017-10-24 DIAGNOSIS — R4701 Aphasia: Secondary | ICD-10-CM

## 2017-10-24 DIAGNOSIS — I69190 Apraxia following nontraumatic intracerebral hemorrhage: Secondary | ICD-10-CM

## 2017-10-24 NOTE — Therapy (Signed)
[place] MAIN [facility] SERVICES [address] [place], [place], [address] Phone: [phone]   Fax:  [phone]  Speech Language Pathology Treatment  Patient Details  Name: Todd [name] MRN: [id number] Date of Birth: 82-23-1932 Referring Provider: Valerie Roys    Encounter Date: 10/24/2017  End of Session - 10/24/17 1729    Visit Number  8    Number of Visits  17    Date for SLP Re-Evaluation  11/02/17    SLP Start Time  1500    SLP Stop Time   8850    SLP Time Calculation (min)  55 min    Activity Tolerance  Patient tolerated treatment well       Past Medical History:  Diagnosis Date  . Anxiety   . Aortic regurgitation    a. 03/2017 Echo: Mod AI; b. 05/2017 Echo: Triv AI.  [name] Kitchen Arthritis    osteoarthritis-left knee  . CAD (coronary artery disease)    a. Reported h/o cath ~ 2008 - ? small vessel dzs. No PCI performed; b. 03/2017 MV: EF 45-54%, no ischemia/infarct.  . Depression   . Diabetes mellitus without complication ([place])   . Diastolic dysfunction    a. 03/2017 Echo: EF 55-65%; b. 05/2017 Echo: EF 55-60%, no rwma, Gr1 DD. Triv AI.  Mildly dil LA.  . ED (erectile dysfunction)   . Elevated troponin    a. chronic mild elevation - 0.03->0.04.  [name] Kitchen GERD (gastroesophageal reflux disease)   . Glaucoma   . Hyperlipidemia   . Hypertension   . Iron deficiency anemia due to chronic blood loss   . Left pontine stroke w/ cerebrovascular disease(HCC)    a. 05/2017 MRI/A: acute to subacute L pontine infarct. Chronic left thalamic lacunar infarct. Severe basilar artery stenosis w/ radiographic string sign of the distal 1/3. Mild to moderate L PCA P2 segment stenosis; b. 05/2017 Carotid U/S: <50% bilat ICA stenosis.  . Lumbago    Lumbosacral Neuritis  . Prostate cancer [facility])     Past Surgical History:  Procedure Laterality Date  . CARPAL TUNNEL RELEASE    . REPLACEMENT TOTAL KNEE BILATERAL      There were no vitals filed for this  visit.  Subjective Assessment - 10/24/17 1728    Subjective  Easily frustrated with difficuties expressing himself    Currently in Pain?  No/denies    Multiple Pain Sites  No            ADULT SLP TREATMENT - 10/24/17 0001      General Information   Behavior/Cognition  Alert;Cooperative;Pleasant mood;Impulsive    HPI   84 year old man S/P left pontine stroke 05/17/2017.  The patient received rehab services, including speech therapy, at a SNF (05/21/2017 - 06/22/2017) and via home health (06/22/2017 - 08/09/2017).  Past medical history includes vascular dementia.       Treatment Provided   Treatment provided  Cognitive-Linquistic      Pain Assessment   Pain Assessment  No/denies pain      Cognitive-Linquistic Treatment   Treatment focused on  Aphasia;Apraxia    Skilled Treatment  Named what was happening in a picture correctly with 95% accuracy. Answered follow up questions in sentences about the picture or his own opinions relating to the picture 1 or 2 word. His longest sentence was 4 words. Errors include phonemic paraphasia, perseveration, and blockage. Was able to generate simple phrase to explain what was dangerous about a picture with 80% accuracy  Was able to give an example of what he would say to the person in the picture using phrases up to 4 words.      Assessment / Recommendations / Plan   Plan  Continue with current plan of care      Progression Toward Goals   Progression toward goals  Progressing toward goals       SLP Education - 10/24/17 1729    Education provided  Yes    Education Details  Therapist encouraged him to stay calm when his speech is blocked and demonstrated how to use drawing to assist his communication when talking about sports. Pt. verbalized and demonstrated understanding.?    Person(s) Educated  Patient    Methods  Explanation;Demonstration;Verbal cues    Comprehension  Verbalized understanding         SLP Long Term Goals - 09/28/17 1600       SLP LONG TERM GOAL #1   Title  Patient will read aloud or repeat words/phrases/sentences, maintaining phonemic accuracy, with 80% accuracy/fluency.      Time  8    Period  Weeks    Status  On-going    Target Date  11/02/17      SLP LONG TERM GOAL #2   Title  Patient will complete semantic feature word finding tasks with 80% accuracy.    Time  8    Period  Weeks    Status  On-going    Target Date  11/02/17      SLP LONG TERM GOAL #3   Title  Patient will generate grammatical and cogent phrases to complete simple/concrete linguistic task with 80% accuracy.    Time  8    Period  Weeks    Status  On-going    Target Date  11/02/17      SLP LONG TERM GOAL #4   Title  Patient will complete multi-unit processing tasks with 80% accuracy without the need of repetition of task instructions or significant delays in responding.    Time  8    Period  Weeks    Status  On-going    Target Date  11/02/17       Plan - 10/24/17 1730    Clinical Impression Statement  Pt. is engaged and making progress. He becomes frustrated when his speech is blocked.    Speech Therapy Frequency  2x / week    Duration  Other (comment)    Treatment/Interventions  Language facilitation;Cueing hierarchy;SLP instruction and feedback;Multimodal communcation approach;Patient/family education    Potential to Achieve Goals  Fair    Potential Considerations  Ability to learn/carryover information;Pain level;Family/community support;Co-morbidities;Previous level of function;Cooperation/participation level;Severity of impairments;Medical prognosis    SLP Home Exercise Plan  To be determined    Consulted and Agree with Plan of Care  Patient       Patient will benefit from skilled therapeutic intervention in order to improve the following deficits and impairments:   Aphasia  Apraxia following nontraumatic intracerebral hemorrhage    Problem List Patient Active Problem List   Diagnosis Date Noted  . Mixed  hyperlipidemia 09/21/2017  . Aphasia 05/18/2017  . CAD (coronary artery disease) 05/18/2017  . History of stroke with residual deficit 05/18/2017  . 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 Urinary incontinence 04/27/2015  . Anxiety, generalized 04/07/2015  . History of BCG vaccination 04/07/2015  . GERD (gastroesophageal reflux disease) 03/04/2015  . Depression 02/25/2015  . Degeneration of intervertebral disc of lumbar region 02/23/2015  .  Neuritis or radiculitis due to rupture of lumbar intervertebral disc 02/23/2015  . Lumbar canal stenosis 02/23/2015  . Hypertension 12/02/2014  . Diabetes mellitus without complication ([place]) 33/83/2919  . Benign essential tremor 03/13/2014  . Imbalance 03/13/2014  . Malignant neoplasm of prostate ([place]) 10/07/2012  . ED (erectile dysfunction) of organic origin 02/09/2012    Davis Gourd 10/24/2017, 5:31 PM  [place] MAIN [facility] SERVICES [address] [place], [place], [address] Phone: [phone]   Fax:  [phone]   Name: Todd [name] MRN: [id number] Date of Birth: 10-17-1930

## 2017-10-26 ENCOUNTER — Encounter: Payer: Self-pay | Admitting: Unknown Physician Specialty

## 2017-10-26 ENCOUNTER — Ambulatory Visit (INDEPENDENT_AMBULATORY_CARE_PROVIDER_SITE_OTHER): Payer: PPO | Admitting: Unknown Physician Specialty

## 2017-10-26 ENCOUNTER — Ambulatory Visit (INDEPENDENT_AMBULATORY_CARE_PROVIDER_SITE_OTHER): Payer: PPO

## 2017-10-26 VITALS — BP 118/68 | HR 73 | Temp 97.8°F | Resp 17 | Ht 68.0 in | Wt 186.9 lb

## 2017-10-26 VITALS — BP 118/68 | HR 73 | Ht 68.0 in | Wt 186.0 lb

## 2017-10-26 DIAGNOSIS — Z0000 Encounter for general adult medical examination without abnormal findings: Secondary | ICD-10-CM

## 2017-10-26 DIAGNOSIS — H6123 Impacted cerumen, bilateral: Secondary | ICD-10-CM | POA: Diagnosis not present

## 2017-10-26 NOTE — Patient Instructions (Addendum)
[name] , Thank you for taking time to come for your Medicare Wellness Visit. I appreciate your ongoing commitment to your health goals. Please review the following plan we discussed and let me know if I can assist you in the future.   Screening recommendations/referrals: Colonoscopy: no longer required Recommended yearly ophthalmology/optometry visit for glaucoma screening and checkup Recommended yearly dental visit for hygiene and checkup  Vaccinations: Influenza vaccine: due 02/2018 Pneumococcal vaccine: up to date Tdap vaccine: due, check with your insurance company for coverage  Shingles vaccine: due, check with your insurance company for coverage   Advanced directives: Advance directive discussed with you today. I have provided a copy for you to complete at home and have notarized. Once this is complete please bring a copy in to our office so we can scan it into your chart.  Conditions/risks identified: recommend drinking at least 6-8 glasses of water a day   Next appointment:  Follow up 10/23/2017 at 2:30pm with Todd [name]. Follow up in one year for your annual wellness exam.   Preventive Care 65 Years and Older, Male Preventive care refers to lifestyle choices and visits with your health care provider that can promote health and wellness. What does preventive care include?  A yearly physical exam. This is also called an annual well check.  Dental exams once or twice a year.  Routine eye exams. Ask your health care provider how often you should have your eyes checked.  Personal lifestyle choices, including:  Daily care of your teeth and gums.  Regular physical activity.  Eating a healthy diet.  Avoiding tobacco and drug use.  Limiting alcohol use.  Practicing safe sex.  Taking low doses of aspirin every day.  Taking vitamin and mineral supplements as recommended by your health care provider. What happens during an annual well check? The services and  screenings done by your health care provider during your annual well check will depend on your age, overall health, lifestyle risk factors, and family history of disease. Counseling  Your health care provider may ask you questions about your:  Alcohol use.  Tobacco use.  Drug use.  Emotional well-being.  Home and relationship well-being.  Sexual activity.  Eating habits.  History of falls.  Memory and ability to understand (cognition).  Work and work Statistician. Screening  You may have the following tests or measurements:  Height, weight, and BMI.  Blood pressure.  Lipid and cholesterol levels. These may be checked every 5 years, or more frequently if you are over 64 years old.  Skin check.  Lung cancer screening. You may have this screening every year starting at age 58 if you have a 30-pack-year history of smoking and currently smoke or have quit within the past 15 years.  Fecal occult blood test (FOBT) of the stool. You may have this test every year starting at age 24.  Flexible sigmoidoscopy or colonoscopy. You may have a sigmoidoscopy every 5 years or a colonoscopy every 10 years starting at age 35.  Prostate cancer screening. Recommendations will vary depending on your family history and other risks.  Hepatitis C blood test.  Hepatitis B blood test.  Sexually transmitted disease (STD) testing.  Diabetes screening. This is done by checking your blood sugar (glucose) after you have not eaten for a while (fasting). You may have this done every 1-3 years.  Abdominal aortic aneurysm (AAA) screening. You may need this if you are a current or former smoker.  Osteoporosis. You may be  screened starting at age 89 if you are at high risk. Talk with your health care provider about your test results, treatment options, and if necessary, the need for more tests. Vaccines  Your health care provider may recommend certain vaccines, such as:  Influenza vaccine. This is  recommended every year.  Tetanus, diphtheria, and acellular pertussis (Tdap, Td) vaccine. You may need a Td booster every 10 years.  Zoster vaccine. You may need this after age 31.  Pneumococcal 13-valent conjugate (PCV13) vaccine. One dose is recommended after age 37.  Pneumococcal polysaccharide (PPSV23) vaccine. One dose is recommended after age 82. Talk to your health care provider about which screenings and vaccines you need and how often you need them. This information is not intended to replace advice given to you by your health care provider. Make sure you discuss any questions you have with your health care provider. Document Released: 06/18/2015 Document Revised: 02/09/2016 Document Reviewed: 03/23/2015 Elsevier Interactive Patient Education  2017 [place] Prevention in the Home Falls can cause injuries. They can happen to people of all ages. There are many things you can do to make your home safe and to help prevent falls. What can I do on the outside of my home?  Regularly fix the edges of walkways and driveways and fix any cracks.  Remove anything that might make you trip as you walk through a door, such as a raised step or threshold.  Trim any bushes or trees on the path to your home.  Use bright outdoor lighting.  Clear any walking paths of anything that might make someone trip, such as rocks or tools.  Regularly check to see if handrails are loose or broken. Make sure that both sides of any steps have handrails.  Any raised decks and porches should have guardrails on the edges.  Have any leaves, snow, or ice cleared regularly.  Use sand or salt on walking paths during winter.  Clean up any spills in your garage right away. This includes oil or grease spills. What can I do in the bathroom?  Use night lights.  Install grab bars by the toilet and in the tub and shower. Do not use towel bars as grab bars.  Use non-skid mats or decals in the tub or  shower.  If you need to sit down in the shower, use a plastic, non-slip stool.  Keep the floor dry. Clean up any water that spills on the floor as soon as it happens.  Remove soap buildup in the tub or shower regularly.  Attach bath mats securely with double-sided non-slip rug tape.  Do not have throw rugs and other things on the floor that can make you trip. What can I do in the bedroom?  Use night lights.  Make sure that you have a light by your bed that is easy to reach.  Do not use any sheets or blankets that are too big for your bed. They should not hang down onto the floor.  Have a firm chair that has side arms. You can use this for support while you get dressed.  Do not have throw rugs and other things on the floor that can make you trip. What can I do in the kitchen?  Clean up any spills right away.  Avoid walking on wet floors.  Keep items that you use a lot in easy-to-reach places.  If you need to reach something above you, use a strong step stool that has a  grab bar.  Keep electrical cords out of the way.  Do not use floor polish or wax that makes floors slippery. If you must use wax, use non-skid floor wax.  Do not have throw rugs and other things on the floor that can make you trip. What can I do with my stairs?  Do not leave any items on the stairs.  Make sure that there are handrails on both sides of the stairs and use them. Fix handrails that are broken or loose. Make sure that handrails are as long as the stairways.  Check any carpeting to make sure that it is firmly attached to the stairs. Fix any carpet that is loose or worn.  Avoid having throw rugs at the top or bottom of the stairs. If you do have throw rugs, attach them to the floor with carpet tape.  Make sure that you have a light switch at the top of the stairs and the bottom of the stairs. If you do not have them, ask someone to add them for you. What else can I do to help prevent  falls?  Wear shoes that:  Do not have high heels.  Have rubber bottoms.  Are comfortable and fit you well.  Are closed at the toe. Do not wear sandals.  If you use a stepladder:  Make sure that it is fully opened. Do not climb a closed stepladder.  Make sure that both sides of the stepladder are locked into place.  Ask someone to hold it for you, if possible.  Clearly mark and make sure that you can see:  Any grab bars or handrails.  First and last steps.  Where the edge of each step is.  Use tools that help you move around (mobility aids) if they are needed. These include:  Canes.  Walkers.  Scooters.  Crutches.  Turn on the lights when you go into a dark area. Replace any light bulbs as soon as they burn out.  Set up your furniture so you have a clear path. Avoid moving your furniture around.  If any of your floors are uneven, fix them.  If there are any pets around you, be aware of where they are.  Review your medicines with your doctor. Some medicines can make you feel dizzy. This can increase your chance of falling. Ask your doctor what other things that you can do to help prevent falls. This information is not intended to replace advice given to you by your health care provider. Make sure you discuss any questions you have with your health care provider. Document Released: 03/18/2009 Document Revised: 10/28/2015 Document Reviewed: 06/26/2014 Elsevier Interactive Patient Education  2017 [employer].

## 2017-10-26 NOTE — Patient Instructions (Signed)
Put 2-3 drops of mineral of baby oil in ears daily

## 2017-10-26 NOTE — Progress Notes (Signed)
Subjective:   Todd [name] is a 84 y.o. male who presents for Medicare Annual/Subsequent preventive examination.  Review of Systems:   Cardiac Risk Factors include: male gender;hypertension;advanced age (>64mn, >>12women);dyslipidemia     Objective:    Vitals: BP 118/68 (BP Location: Left Arm, Patient Position: Sitting)   Pulse 73   Temp 97.8 F (36.6 C) (Temporal)   Resp 17   Ht '5\' 8"'$  (1.727 m)   Wt 186 lb 14.4 oz (84.8 kg)   BMI 28.42 kg/m   Body mass index is 28.42 kg/m.  Advanced Directives 10/26/2017 08/30/2017 07/16/2017 07/04/2017 06/15/2017 05/18/2017 05/17/2017  Does Patient Have a Medical Advance Directive? No No No - No No No  Type of Advance Directive - - - - - - -  Copy of Healthcare Power of Attorney in Chart? - - - - - - -  Would patient like information on creating a medical advance directive? Yes (MAU/Ambulatory/Procedural Areas - Information given) - - (No Data) Yes (MAU/Ambulatory/Procedural Areas - Information given) Yes (ED - Information included in AVS) -    Tobacco Social History   Tobacco Use  Smoking Status Former Smoker  . Last attempt to quit: 12/02/1994  . Years since quitting: 22.9  Smokeless Tobacco Never Used     Counseling given: Not Answered   Clinical Intake:  Pre-visit preparation completed: Yes  Pain : No/denies pain Pain Score: 0-No pain     Nutritional Status: BMI 25 -29 Overweight Nutritional Risks: None Diabetes: Yes CBG done?: No Did pt. bring in CBG monitor from home?: No  How often do you need to have someone help you when you read instructions, pamphlets, or other written materials from your doctor or pharmacy?: 5 - Always  Interpreter Needed?: No  Comments: daughters here to help with medical history Information entered by :: Tiffany Hill,LPN  Past Medical History:  Diagnosis Date  . Anxiety   . Aortic regurgitation    a. 03/2017 Echo: Mod AI; b. 05/2017 Echo: Triv AI.  .[name] KitchenArthritis    osteoarthritis-left  knee  . CAD (coronary artery disease)    a. Reported h/o cath ~ 2008 - ? small vessel dzs. No PCI performed; b. 03/2017 MV: EF 45-54%, no ischemia/infarct.  . Depression   . Diabetes mellitus without complication ([place]   . Diastolic dysfunction    a. 03/2017 Echo: EF 55-65%; b. 05/2017 Echo: EF 55-60%, no rwma, Gr1 DD. Triv AI.  Mildly dil LA.  . ED (erectile dysfunction)   . Elevated troponin    a. chronic mild elevation - 0.03->0.04.  .[name] KitchenGERD (gastroesophageal reflux disease)   . Glaucoma   . Hyperlipidemia   . Hypertension   . Iron deficiency anemia due to chronic blood loss   . Left pontine stroke w/ cerebrovascular disease(HCC)    a. 05/2017 MRI/A: acute to subacute L pontine infarct. Chronic left thalamic lacunar infarct. Severe basilar artery stenosis w/ radiographic string sign of the distal 1/3. Mild to moderate L PCA P2 segment stenosis; b. 05/2017 Carotid U/S: <50% bilat ICA stenosis.  . Lumbago    Lumbosacral Neuritis  . Prostate cancer ([facility]    Past Surgical History:  Procedure Laterality Date  . CARPAL TUNNEL RELEASE    . REPLACEMENT TOTAL KNEE BILATERAL     Family History  Problem Relation Age of Onset  . Stroke Mother   . Hypertension Mother   . Hypertension Father   . Stroke Sister   . Prostate cancer Neg Hx   .  Kidney cancer Neg Hx   . Bladder Cancer Neg Hx    Social History   Socioeconomic History  . Marital status: Widowed    Spouse name: Not on file  . Number of children: Not on file  . Years of education: Not on file  . Highest education level: Not on file  Occupational History  . Occupation: retired  Scientific laboratory technician  . Financial resource strain: Not hard at all  . Food insecurity:    Worry: Never true    Inability: Never true  . Transportation needs:    Medical: No    Non-medical: No  Tobacco Use  . Smoking status: Former Smoker    Last attempt to quit: 12/02/1994    Years since quitting: 22.9  . Smokeless tobacco: Never Used  Substance and  Sexual Activity  . Alcohol use: Yes    Alcohol/week: 0.6 oz    Types: 1 Cans of beer per week  . Drug use: No  . Sexual activity: Never  Lifestyle  . Physical activity:    Days per week: 0 days    Minutes per session: 0 min  . Stress: Not at all  Relationships  . Social connections:    Talks on phone: Not on file    Gets together: Not on file    Attends religious service: Not on file    Active member of club or organization: Not on file    Attends meetings of clubs or organizations: Not on file    Relationship status: Widowed  Other Topics Concern  . Not on file  Social History Narrative  . Not on file    Outpatient Encounter Medications as of 10/26/2017  Medication Sig  . acetaminophen (TYLENOL 8 HOUR ARTHRITIS PAIN) 650 MG CR tablet Take 650 mg by mouth every 8 (eight) hours as needed for pain.  [name] Kitchen aspirin EC 81 MG tablet Take 81 mg by mouth daily.  [name] Kitchen atorvastatin (LIPITOR) 10 MG tablet Take 1 tablet (10 mg total) by mouth daily.  . Blood Glucose Monitoring Suppl (ACCU-CHEK AVIVA PLUS) w/Device KIT Use to check sugar levels x 2 daily  . clonazePAM (KLONOPIN) 0.5 MG tablet Take 1 tablet (0.5 mg total) by mouth daily as needed for anxiety.  . clopidogrel (PLAVIX) 75 MG tablet Take 1 tablet (75 mg total) by mouth daily.  . DULoxetine (CYMBALTA) 20 MG capsule   . gabapentin (NEURONTIN) 100 MG capsule Take 100 mg by mouth 2 (two) times daily. Once a day at night  . hydrALAZINE (APRESOLINE) 25 MG tablet Take 1 tablet (25 mg total) by mouth 3 (three) times daily.  . hydrochlorothiazide (HYDRODIURIL) 25 MG tablet Take 1 tablet (25 mg total) by mouth daily.  . Lancets (ACCU-CHEK SOFT TOUCH) lancets Use as instructed  . latanoprost (XALATAN) 0.005 % ophthalmic solution Place 1 drop into both eyes at bedtime.   . metFORMIN (GLUCOPHAGE) 500 MG tablet Take 1 tablet (500 mg total) by mouth daily with breakfast.  . pantoprazole (PROTONIX) 40 MG tablet Take 1 tablet (40 mg total) by mouth  daily.  . propranolol ER (INDERAL LA) 80 MG 24 hr capsule Take 1 capsule (80 mg total) by mouth daily.  [name] Kitchen telmisartan (MICARDIS) 80 MG tablet Take 1 tablet (80 mg total) by mouth daily.  . busPIRone (BUSPAR) 10 MG tablet Take 10 mg by mouth at bedtime.   . naproxen sodium (ALEVE) 220 MG tablet Take 220 mg by mouth 2 (two) times daily as needed.  No facility-administered encounter medications on file as of 10/26/2017.     Activities of Daily Living In your present state of health, do you have any difficulty performing the following activities: 10/26/2017 06/25/2017  Hearing? Y Y  Comment ears are clogged currently  -  Vision? N N  Difficulty concentrating or making decisions? N Y  Comment - difficult expressing due to speech  Walking or climbing stairs? Y Y  Comment - using walker  Dressing or bathing? N Y  Comment - -  Doing errands, shopping? Tempie Donning  Comment - family Diplomatic Services operational officer and eating ? Y Y  Comment - family prepares meals   Using the Toilet? N N  In the past six months, have you accidently leaked urine? Tempie Donning  Comment wears depends just in case -  Do you have problems with loss of bowel control? Y N  Comment wears depends just in case -  Managing your Medications? Tempie Donning  Comment - famly prepares medications   Managing your Finances? Y Y  Comment - family available to assist   Housekeeping or managing your Housekeeping? Y Y  Comment - Family provides housekeeping chores   Some recent data might be hidden    Patient Care Team: Guadalupe Maple, MD as PCP - General (Family Medicine) Minna Merritts, MD as PCP - Cardiology (Cardiology) Collier Flowers, MD as Referring Physician (Urology) Birder Robson, MD as Referring Physician (Ophthalmology) Reche Dixon, PA-C (Orthopedic Surgery) Vladimir Crofts, MD (Neurology)   Assessment:   This is a routine wellness examination for Todd [name].  Exercise Activities and Dietary recommendations Current Exercise Habits: The  patient does not participate in regular exercise at present, Exercise limited by: neurologic condition(s);respiratory conditions(s)  Goals    . DIET - INCREASE WATER INTAKE     Recommend drinking at least 6-8 glasses of water a day        Fall Risk Fall Risk  10/26/2017 06/25/2017 06/13/2017 05/07/2017 03/26/2017  Falls in the past year? Yes Yes No No No  Number falls in past yr: 1 1 - - -  Injury with Fall? No No - - -  Risk for fall due to : Impaired balance/gait History of fall(s);Impaired balance/gait - - -  Follow up Falls prevention discussed;Falls evaluation completed Falls prevention discussed - - -   Is the patient's home free of loose throw rugs in walkways, pet beds, electrical cords, etc?   yes      Grab bars in the bathroom? yes      Handrails on the stairs?   no      Adequate lighting?   yes  Timed Get Up and Go Performed: Completed in 11 seconds with use of assistive devices-walker, steady gait. No intervention needed at this time.   Depression Screen PHQ 2/9 Scores 10/26/2017 07/04/2017 06/13/2017 05/07/2017  PHQ - 2 Score 0 1 0 0  PHQ- 9 Score - - - -    Cognitive Function- unable to perform today      6CIT Screen 10/25/2016  What Year? 0 points  What month? 0 points  What time? 3 points  Count back from 20 0 points  Months in reverse 4 points  Repeat phrase 10 points  Total Score 17    Immunization History  Administered Date(s) Administered  . Influenza, High Dose Seasonal PF 03/15/2016, 07/02/2017  . Influenza,inj,Quad PF,6+ Mos 03/05/2015, 04/01/2015  . Influenza-Unspecified 03/17/2014  . Pneumococcal Conjugate-13 03/17/2014  .  Pneumococcal Polysaccharide-23 03/05/2001  . Td 10/22/2003  . Zoster 07/12/2009    Qualifies for Shingles Vaccine? Yes, discussed shingrix vaccine   Screening Tests Health Maintenance  Topic Date Due  . TETANUS/TDAP  10/21/2013  . OPHTHALMOLOGY EXAM  12/27/2017  . INFLUENZA VACCINE  01/03/2018  . HEMOGLOBIN A1C  02/23/2018   . FOOT EXAM  08/24/2018  . PNA vac Low Risk Adult  Completed   Cancer Screenings: Lung: Low Dose CT Chest recommended if Age 70-80 years, 30 pack-year currently smoking OR have quit w/in 15years. Patient does not qualify. Colorectal: no longer required  Additional Screenings: Hepatitis C Screening: not indicated       Plan:    I have personally reviewed and addressed the Medicare Annual Wellness questionnaire and have noted the following in the patient's chart:  A. Medical and social history B. Use of alcohol, tobacco or illicit drugs  C. Current medications and supplements D. Functional ability and status E.  Nutritional status F.  Physical activity G. Advance directives H. List of other physicians I.  Hospitalizations, surgeries, and ER visits in previous 12 months J.  [place] such as hearing and vision if needed, cognitive and depression L. Referrals and appointments   In addition, I have reviewed and discussed with patient certain preventive protocols, quality metrics, and best practice recommendations. A written personalized care plan for preventive services as well as general preventive health recommendations were provided to patient.   Signed,  Tyler Aas, LPN Nurse Health Advisor   Nurse Notes:none

## 2017-10-26 NOTE — Progress Notes (Signed)
BP 118/68   Pulse 73   Ht 5\' 8"  (1.727 m)   Wt 186 lb (84.4 kg)   BMI 28.28 kg/m    Subjective:    Patient ID: Todd [name], male    DOB: 19-Oct-1930, 84 y.o.   MRN: [id number]  HPI: Todd [name] is a 84 y.o. male  No chief complaint on file.  Pt with aphasia and using a walker.  Bilateral ear impaction that family would like them cleaned out to help with communication skills.    Relevant past medical, surgical, family and social history reviewed and updated as indicated. Interim medical history since our last visit reviewed. Allergies and medications reviewed and updated.  Review of Systems  Per HPI unless specifically indicated above     Objective:    BP 118/68   Pulse 73   Ht 5\' 8"  (1.727 m)   Wt 186 lb (84.4 kg)   BMI 28.28 kg/m   Wt Readings from Last 3 Encounters:  10/26/17 186 lb (84.4 kg)  10/26/17 186 lb 14.4 oz (84.8 kg)  09/26/17 189 lb (85.7 kg)    Physical Exam  Constitutional: He is oriented to person, place, and time. He appears well-developed and well-nourished. No distress.  HENT:  Head: Normocephalic and atraumatic.  Bilateral ear impaction.  Able to irrigate left ear though having some canal bleeding.  Used gauze to help stop the bleeding.  Unable to irrigate left ear due to hardness of was  Eyes: Conjunctivae and lids are normal. Right eye exhibits no discharge. Left eye exhibits no discharge. No scleral icterus.  Cardiovascular: Normal rate.  Pulmonary/Chest: Effort normal.  Abdominal: Normal appearance. There is no splenomegaly or hepatomegaly.  Musculoskeletal: Normal range of motion.  Neurological: He is alert and oriented to person, place, and time.  Skin: Skin is intact. No rash noted. No pallor.  Psychiatric: He has a normal mood and affect. His behavior is normal. Judgment and thought content normal.    Results for orders placed or performed in visit on 94/17/40  Basic metabolic panel  Result Value Ref Range   Glucose 169 (H) 65 - 99  mg/dL   BUN 24 8 - 27 mg/dL   Creatinine, Ser 1.16 0.76 - 1.27 mg/dL   GFR calc non Af Amer 58 (L) >59 mL/min/1.73   GFR calc Af Amer 66 >59 mL/min/1.73   BUN/Creatinine Ratio 21 10 - 24   Sodium 139 134 - 144 mmol/L   Potassium 4.6 3.5 - 5.2 mmol/L   Chloride 98 96 - 106 mmol/L   CO2 25 20 - 29 mmol/L   Calcium 9.6 8.6 - 10.2 mg/dL  LP+ALT+AST Piccolo, Waived  Result Value Ref Range   ALT (SGPT) Piccolo, Waived 19 10 - 47 U/L   AST (SGOT) Piccolo, Waived 27 11 - 38 U/L   Cholesterol Piccolo, Waived 156 <200 mg/dL   HDL Chol Piccolo, Waived 46 (L) >59 mg/dL   Triglycerides Piccolo,Waived 156 (H) <150 mg/dL   Chol/HDL Ratio Piccolo,Waive 3.4 mg/dL   LDL Chol Calc Piccolo Waived 79 <100 mg/dL   VLDL Chol Calc Piccolo,Waive 31 (H) <30 mg/dL      Assessment & Plan:   Problem List Items Addressed This Visit    None    Visit Diagnoses    Bilateral impacted cerumen    -  Primary   New problem.  Right ear irrigated.  Gauze placed due to trauma.  Pt ed on drops to help left impaction.  RTC  if no improvement.         Follow up plan: Return if symptoms worsen or fail to improve.

## 2017-10-30 ENCOUNTER — Ambulatory Visit: Payer: Self-pay | Admitting: Family Medicine

## 2017-10-30 ENCOUNTER — Other Ambulatory Visit: Payer: Self-pay | Admitting: Family Medicine

## 2017-10-30 NOTE — Telephone Encounter (Signed)
Pt's daughter reports pt with increased SOB x 2 MONTHS; only with exertion. States SOB going from chair to bathroom," Hard to get through his P.T.."  Not aware of O2 Sats.. Poor endurance and fatigues easily.  Noted worsening 2 months ago. States reported to cardiologist who stated "Everything was good."  Also saw nurse for Wellness visit who reported "Everything looks good." States pt has appt Friday with C. Wicker; just wanted PCP to be aware and assess need for O2.  Care advise given per protocol. Advised to go to ED if symptoms worsen.  Reason for Disposition . [1] MODERATE longstanding difficulty breathing (e.g., speaks in phrases, SOB even at rest, pulse 100-120) AND [2] SAME as normal  Answer Assessment - Initial Assessment Questions 1. RESPIRATORY STATUS: "Describe your breathing?" (e.g., wheezing, shortness of breath, unable to speak, severe coughing)     "Just tired and trying to get his breath" 2. ONSET: "When did this breathing problem begin?"      2 months ago 3. PATTERN "Does the difficult breathing come and go, or has it been constant since it started?"     With exertion 4. SEVERITY: "How bad is your breathing?" (e.g., mild, moderate, severe)    - MILD: No SOB at rest, mild SOB with walking, speaks normally in sentences, can lay down, no retractions, pulse < 100.    - MODERATE: SOB at rest, SOB with minimal exertion and prefers to sit, cannot lie down flat, speaks in phrases, mild retractions, audible wheezing, pulse 100-120.    - SEVERE: Very SOB at rest, speaks in single words, struggling to breathe, sitting hunched forward, retractions, pulse > 120     Only with exertion 5. RECURRENT SYMPTOM: "Have you had difficulty breathing before?" If so, ask: "When was the last time?" and "What happened that time?"      X 2 months 6. CARDIAC HISTORY: "Do you have any history of heart disease?" (e.g., heart attack, angina, bypass surgery, angioplasty)      CAD, Aortic valve regurgitation,   7. LUNG HISTORY: "Do you have any history of lung disease?"  (e.g., pulmoary embolus, asthma, emphysema)     8. CAUSE: "What do you think is causing the breathing problem?"      Unsure 9. OTHER SYMPTOMS: "Do you have any other symptoms? (e.g., dizziness, runny nose, cough, chest pain, fever)     Poor endurance, fatigues easily  Protocols used: BREATHING DIFFICULTY-A-AH

## 2017-10-30 NOTE — Telephone Encounter (Signed)
Please let patient know that [name] is out of the office until next week- will call him next week unless she feels like he needs to be seen sooner.

## 2017-10-30 NOTE — Telephone Encounter (Signed)
Attempted to reach pt no answer. Will attempt again.

## 2017-10-31 ENCOUNTER — Ambulatory Visit: Payer: PPO | Admitting: Speech Pathology

## 2017-10-31 ENCOUNTER — Ambulatory Visit: Payer: PPO

## 2017-10-31 ENCOUNTER — Encounter: Payer: Self-pay | Admitting: Speech Pathology

## 2017-10-31 VITALS — BP 119/96 | HR 70

## 2017-10-31 DIAGNOSIS — R2681 Unsteadiness on feet: Secondary | ICD-10-CM

## 2017-10-31 DIAGNOSIS — I69190 Apraxia following nontraumatic intracerebral hemorrhage: Secondary | ICD-10-CM

## 2017-10-31 DIAGNOSIS — M6281 Muscle weakness (generalized): Secondary | ICD-10-CM

## 2017-10-31 DIAGNOSIS — R4701 Aphasia: Secondary | ICD-10-CM

## 2017-10-31 NOTE — Therapy (Signed)
[place] [facility]IN [facility] SERVICES [address] [place], [place], [address] Phone: [phone]   Fax:  [phone]  Speech Language Pathology Treatment  Patient Details  Name: Todd [name] MRN: [id number] Date of Birth: 11/24/1930 Referring Provider: Valerie Roys    Encounter Date: 10/31/2017  End of Session - 10/31/17 1632    Visit Number  9    Number of Visits  17    Date for SLP Re-Evaluation  11/02/17    SLP Start Time  1500    SLP Stop Time   1555    SLP Time Calculation (min)  55 min    Activity Tolerance  Patient tolerated treatment well       Past Medical History:  Diagnosis Date  . Anxiety   . Aortic regurgitation    a. 03/2017 Echo: Mod AI; b. 05/2017 Echo: Triv AI.  [name] Kitchen Arthritis    osteoarthritis-left knee  . CAD (coronary artery disease)    a. Reported h/o cath ~ 2008 - ? small vessel dzs. No PCI performed; b. 03/2017 MV: EF 45-54%, no ischemia/infarct.  . Depression   . Diabetes mellitus without complication ([place])   . Diastolic dysfunction    a. 03/2017 Echo: EF 55-65%; b. 05/2017 Echo: EF 55-60%, no rwma, Gr1 DD. Triv AI.  Mildly dil LA.  . ED (erectile dysfunction)   . Elevated troponin    a. chronic mild elevation - 0.03->0.04.  [name] Kitchen GERD (gastroesophageal reflux disease)   . Glaucoma   . Hyperlipidemia   . Hypertension   . Iron deficiency anemia due to chronic blood loss   . Left pontine stroke w/ cerebrovascular disease(HCC)    a. 05/2017 MRI/A: acute to subacute L pontine infarct. Chronic left thalamic lacunar infarct. Severe basilar artery stenosis w/ radiographic string sign of the distal 1/3. Mild to moderate L PCA P2 segment stenosis; b. 05/2017 Carotid U/S: <50% bilat ICA stenosis.  . Lumbago    Lumbosacral Neuritis  . Prostate cancer [facility])     Past Surgical History:  Procedure Laterality Date  . CARPAL TUNNEL RELEASE    . REPLACEMENT TOTAL KNEE BILATERAL      There were no vitals filed for this  visit.  Subjective Assessment - 10/31/17 1631    Subjective  Easily frustrated with difficulties expressing himself but is able to be redirected when prompted.     Currently in Pain?  No/denies    Pain Score  0-No pain    Multiple Pain Sites  No            ADULT SLP TREATMENT - 10/31/17 0001      General Information   Behavior/Cognition  Alert;Cooperative;Pleasant mood;Impulsive    HPI   84 year old man S/P left pontine stroke 05/17/2017.  The patient received rehab services, including speech therapy, at a SNF (05/21/2017 - 06/22/2017) and via home health (06/22/2017 - 08/09/2017).  Past medical history includes vascular dementia.       Treatment Provided   Treatment provided  Cognitive-Linquistic      Pain Assessment   Pain Assessment  No/denies pain      Cognitive-Linquistic Treatment   Treatment focused on  Aphasia;Apraxia    Skilled Treatment  : LANGUAGE Named what was happening in a verb picture correctly using two word phrases with 100% accuracy (17/17). He used appropriate -ing forms of the word in 15/17 trials. Answered follow up questions using 1-3 word phrases about the picture or his own opinions  relating to the picture. He was able to participate in conversation about Will Tamala Julian movies using pictures from [employer] as a visual aid. He spontaneously made 4-6 word comments about movie prices and Elton Sin movies. Errors include perseveration and blockage." When he is blocked, he improves with the tactile cue to "put his mouth in the right position." He responded well to this cue.       Assessment / Recommendations / Plan   Plan  Continue with current plan of care      Progression Toward Goals   Progression toward goals  Progressing toward goals       SLP Education - 10/31/17 1632    Education provided  Yes    Education Details  Therapist encouraged him to stay calm when his speech is blocked and demonstrated how to use drawing to assist his communication when  talking about movies.     Person(s) Educated  Patient    Methods  Explanation;Demonstration    Comprehension  Verbalized understanding         SLP Long Term Goals - 09/28/17 1600      SLP LONG TERM GOAL #1   Title  Patient will read aloud or repeat words/phrases/sentences, maintaining phonemic accuracy, with 80% accuracy/fluency.      Time  8    Period  Weeks    Status  On-going    Target Date  11/02/17      SLP LONG TERM GOAL #2   Title  Patient will complete semantic feature word finding tasks with 80% accuracy.    Time  8    Period  Weeks    Status  On-going    Target Date  11/02/17      SLP LONG TERM GOAL #3   Title  Patient will generate grammatical and cogent phrases to complete simple/concrete linguistic task with 80% accuracy.    Time  8    Period  Weeks    Status  On-going    Target Date  11/02/17      SLP LONG TERM GOAL #4   Title  Patient will complete multi-unit processing tasks with 80% accuracy without the need of repetition of task instructions or significant delays in responding.    Time  8    Period  Weeks    Status  On-going    Target Date  11/02/17       Plan - 10/31/17 1632    Clinical Impression Statement  Pt. is engaged and making progress. He becomes frustrated when his speech is blocked, but responds well when the therapist prompts him to stay calm and relax his muscles.     Speech Therapy Frequency  2x / week    Duration  Other (comment)    Treatment/Interventions  Language facilitation;Cueing hierarchy;SLP instruction and feedback;Multimodal communcation approach;Patient/family education    Potential to Achieve Goals  Fair    Potential Considerations  Ability to learn/carryover information;Pain level;Family/community support;Co-morbidities;Previous level of function;Cooperation/participation level;Severity of impairments;Medical prognosis    SLP Home Exercise Plan  To be determined    Consulted and Agree with Plan of Care  Patient        Patient will benefit from skilled therapeutic intervention in order to improve the following deficits and impairments:   Aphasia  Apraxia following nontraumatic intracerebral hemorrhage    Problem List Patient Active Problem List   Diagnosis Date Noted  . Mixed hyperlipidemia 09/21/2017  . Aphasia 05/18/2017  . CAD (coronary artery disease)  05/18/2017  . History of stroke with residual deficit 05/18/2017  . 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 Urinary incontinence 04/27/2015  . Anxiety, generalized 04/07/2015  . History of BCG vaccination 04/07/2015  . GERD (gastroesophageal reflux disease) 03/04/2015  . Depression 02/25/2015  . Degeneration of intervertebral disc of lumbar region 02/23/2015  . Neuritis or radiculitis due to rupture of lumbar intervertebral disc 02/23/2015  . Lumbar canal stenosis 02/23/2015  . Hypertension 12/02/2014  . Diabetes mellitus without complication ([place]) 25/00/3704  . Benign essential tremor 03/13/2014  . Imbalance 03/13/2014  . Malignant neoplasm of prostate ([place]) 10/07/2012  . ED (erectile dysfunction) of organic origin 02/09/2012    Davis Gourd 10/31/2017, 4:33 PM  [place] [facility] SERVICES [address] [place], [place], [address] Phone: [phone]   Fax:  [phone]   Name: Todd [name] MRN: [id number] Date of Birth: 10/24/1930

## 2017-10-31 NOTE — Therapy (Signed)
[place] MAIN [facility] SERVICES [address] [place], [place], [address] Phone: [phone]   Fax:  [phone]  Physical Therapy Treatment  Patient Details  Name: Todd [name] MRN: [id number] Date of Birth: 01/12/1931 Referring Provider: Park Liter   Encounter Date: 10/31/2017  PT End of Session - 10/31/17 1709    Visit Number  5    Number of Visits  17    Date for PT Re-Evaluation  11/26/17    Authorization Type  progress note 5/10    Authorization Time Period  last goals 10/01/17    PT Start Time  1600    PT Stop Time  1630    PT Time Calculation (min)  30 min    Equipment Utilized During Treatment  Gait belt    Activity Tolerance  Patient tolerated treatment well    Behavior During Therapy  [facility] for tasks assessed/performed       Past Medical History:  Diagnosis Date  . Anxiety   . Aortic regurgitation    a. 03/2017 Echo: Mod AI; b. 05/2017 Echo: Triv AI.  [name] Kitchen Arthritis    osteoarthritis-left knee  . CAD (coronary artery disease)    a. Reported h/o cath ~ 2008 - ? small vessel dzs. No PCI performed; b. 03/2017 MV: EF 45-54%, no ischemia/infarct.  . Depression   . Diabetes mellitus without complication ([place])   . Diastolic dysfunction    a. 03/2017 Echo: EF 55-65%; b. 05/2017 Echo: EF 55-60%, no rwma, Gr1 DD. Triv AI.  Mildly dil LA.  . ED (erectile dysfunction)   . Elevated troponin    a. chronic mild elevation - 0.03->0.04.  [name] Kitchen GERD (gastroesophageal reflux disease)   . Glaucoma   . Hyperlipidemia   . Hypertension   . Iron deficiency anemia due to chronic blood loss   . Left pontine stroke w/ cerebrovascular disease(HCC)    a. 05/2017 MRI/A: acute to subacute L pontine infarct. Chronic left thalamic lacunar infarct. Severe basilar artery stenosis w/ radiographic string sign of the distal 1/3. Mild to moderate L PCA P2 segment stenosis; b. 05/2017 Carotid U/S: <50% bilat ICA stenosis.  . Lumbago    Lumbosacral Neuritis  .  Prostate cancer [facility])     Past Surgical History:  Procedure Laterality Date  . CARPAL TUNNEL RELEASE    . REPLACEMENT TOTAL KNEE BILATERAL      Vitals:   10/31/17 1609  BP: (!) 119/96  Pulse: 70  SpO2: 100%    Subjective Assessment - 10/31/17 1707    Subjective  Pt reports that he is doing well today. Denies shortness of breath or pain upon arrival. No external signs of pain. No specific questions or concerns provided by patient or caregiver. Pt denies any recent falls or changes in his health    Pertinent History  84 year old man S/P left pontine stroke 05/17/2017.  The patient received rehab services, including speech therapy, at a SNF (05/21/2017 - 06/22/2017) and via home health (06/22/2017 - 08/09/2017).  Past medical history includes vascular dementia.  Hx of back pain. Pt with significant expressive aphasia.    How long can you walk comfortably?  Very short distances. Pt becomes winded easily. Pt normally only walks approximately 40m at a time per daughter.     Currently in Pain?  No/denies          TREATMENT  Ther-ex Seated marches with 3# ankle weights 2 x 15bilateral; Seated clams with manual resistance 2 x  15; Seated adductor squeeze 2 x 15; Seated LAQ with 3# ankle weights 2 x 15; Seated HS curls with red tband resistance 2 x 15; Seated heel raises 2 x 15; Sit to stand from elevated mat table without UE supportx 5;                      PT Education - 10/31/17 1709    Education provided  Yes    Education Details  exercise form/technique    Person(s) Educated  Patient    Methods  Explanation    Comprehension  Verbalized understanding       PT Short Term Goals - 10/02/17 2059      PT SHORT TERM GOAL #1   Title  Pt will be independent with HEP in order to improve strength and balance in order to decrease fall risk and improve function at home and work.     Time  8    Period  Weeks    Status  New    Target Date  11/27/17        PT  Long Term Goals - 10/02/17 2059      PT LONG TERM GOAL #1   Title  Pt will improve BERG by at least 3 points in order to demonstrate clinically significant improvement in balance.     Baseline  10/01/17: 24/56    Time  8    Period  Weeks    Status  New    Target Date  11/26/17      PT LONG TERM GOAL #2   Title  Pt will decrease TUG to below 14 seconds in order to demonstrate decreased fall risk     Baseline  10/01/17: 40.1s    Time  8    Period  Weeks    Status  New    Target Date  11/26/17            Plan - 10/31/17 1710    Clinical Impression Statement  No signs of DOE during session. Pt is able to complete all seated exercises during session but does become fatigue. Session completed after 30 minutes at caregiver request due to fatigue following therapy sessions. Pt encouraged to follow-up as scheduled.    Rehab Potential  Fair    Clinical Impairments Affecting Rehab Potential  Positive: acuity, Negative: lack of motivation    PT Frequency  2x / week    PT Duration  8 weeks    PT Treatment/Interventions  ADLs/Self Care Home Management;Aquatic Therapy;Biofeedback;Canalith Repostioning;Cryotherapy;Electrical Stimulation;Traction;Ultrasound;DME Instruction;Gait Scientist, forensic;Therapeutic activities;Therapeutic exercise;Balance training;Neuromuscular re-education;Patient/family education;Manual techniques;Vestibular;Cognitive remediation;Energy conservation;Dry needling    PT Next Visit Plan  balance and strength training    PT Home Exercise Plan  None issued at this time. Pt encouraged to continue current home program has still not brought his HEP from home    Consulted and Agree with Plan of Care  Patient       Patient will benefit from skilled therapeutic intervention in order to improve the following deficits and impairments:  Decreased strength, Difficulty walking, Decreased balance  Visit Diagnosis: Muscle weakness (generalized)  Unsteadiness on  feet     Problem List Patient Active Problem List   Diagnosis Date Noted  . Mixed hyperlipidemia 09/21/2017  . Aphasia 05/18/2017  . CAD (coronary artery disease) 05/18/2017  . History of stroke with residual deficit 05/18/2017  . 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 Urinary incontinence 04/27/2015  . Anxiety, generalized  04/07/2015  . History of BCG vaccination 04/07/2015  . GERD (gastroesophageal reflux disease) 03/04/2015  . Depression 02/25/2015  . Degeneration of intervertebral disc of lumbar region 02/23/2015  . Neuritis or radiculitis due to rupture of lumbar intervertebral disc 02/23/2015  . Lumbar canal stenosis 02/23/2015  . Hypertension 12/02/2014  . Diabetes mellitus without complication ([place]) 61/44/3154  . Benign essential tremor 03/13/2014  . Imbalance 03/13/2014  . Malignant neoplasm of prostate ([place]) 10/07/2012  . ED (erectile dysfunction) of organic origin 02/09/2012   Phillips Grout PT, DPT   Caelynn Marshman 10/31/2017, 5:13 PM  [place] MAIN [facility] SERVICES [address] [place], [place], [address] Phone: [phone]   Fax:  [phone]  Name: Todd [name] MRN: [id number] Date of Birth: February 06, 1931

## 2017-11-02 ENCOUNTER — Encounter

## 2017-11-02 ENCOUNTER — Ambulatory Visit (INDEPENDENT_AMBULATORY_CARE_PROVIDER_SITE_OTHER): Payer: PPO | Admitting: Unknown Physician Specialty

## 2017-11-02 ENCOUNTER — Encounter: Payer: Self-pay | Admitting: Unknown Physician Specialty

## 2017-11-02 VITALS — BP 115/70 | HR 80 | Temp 97.5°F | Ht 68.0 in | Wt 184.4 lb

## 2017-11-02 DIAGNOSIS — H6122 Impacted cerumen, left ear: Secondary | ICD-10-CM

## 2017-11-02 DIAGNOSIS — R0902 Hypoxemia: Secondary | ICD-10-CM | POA: Diagnosis not present

## 2017-11-02 NOTE — Telephone Encounter (Signed)
Patient had an appointment today 11/02/2017 with Kathrine Haddock.

## 2017-11-02 NOTE — Assessment & Plan Note (Signed)
Pulse ox decreased to 82% on ambulation.  Check CBC.  Get chest x-ray.  Refer for home O2 therapy

## 2017-11-02 NOTE — Progress Notes (Addendum)
BP 115/70 (BP Location: Left Arm, Cuff Size: Normal)   Pulse 80   Temp (!) 97.5 F (36.4 C) (Oral)   Ht 5\' 8"  (1.727 m)   Wt 184 lb 6.4 oz (83.6 kg)   SpO2 96%   BMI 28.04 kg/m    Subjective:    Patient ID: Todd [name], male    DOB: [birth year], 84 y.o.   MRN: [id number]  HPI: Todd [name] is a 84 y.o. male  Chief Complaint  Patient presents with  . Follow-up   Pt is here with his daughters who give part of the history.    Pt is here to irrigate ears.  They put mineral oil in his ears as we had difficulty irrigating left ear due to significant impaction. Pt has significant aphasia following intercerebral hemorrhage and hearing is important for his rehab.    While here, daughter wants to discuss his being out of breath out of breath when he walks.  This seems to be worsening over the last 2 months.  Cardiologist said "everything looks good".  She is concerned with pneumonia.  He has no cough, fever, or  rash  Relevant past medical, surgical, family and social history reviewed and updated as indicated. Interim medical history since our last visit reviewed. Allergies and medications reviewed and updated.  Review of Systems  Per HPI unless specifically indicated above     Objective:    BP 115/70 (BP Location: Left Arm, Cuff Size: Normal)   Pulse 80   Temp (!) 97.5 F (36.4 C) (Oral)   Ht 5\' 8"  (1.727 m)   Wt 184 lb 6.4 oz (83.6 kg)   SpO2 96%   BMI 28.04 kg/m   Wt Readings from Last 3 Encounters:  11/02/17 184 lb 6.4 oz (83.6 kg)  10/26/17 186 lb (84.4 kg)  10/26/17 186 lb 14.4 oz (84.8 kg)    Physical Exam  Constitutional: He appears well-developed and well-nourished. No distress.  HENT:  Head: Normocephalic and atraumatic.  Eyes: Conjunctivae and lids are normal. Right eye exhibits no discharge. Left eye exhibits no discharge. No scleral icterus.  Neck: Normal range of motion. Neck supple. No JVD present. Carotid bruit is not present.  Pulmonary/Chest: Effort  normal and breath sounds normal. No respiratory distress.  Abdominal: Normal appearance. There is no splenomegaly or hepatomegaly.  Musculoskeletal: Normal range of motion.  Neurological: He is alert.  Skin: Skin is warm, dry and intact. No rash noted. No pallor.  Psychiatric: He has a normal mood and affect. His behavior is normal. Judgment and thought content normal.    Results for orders placed or performed in visit on 29/52/84  Basic metabolic panel  Result Value Ref Range   Glucose 169 (H) 65 - 99 mg/dL   BUN 24 8 - 27 mg/dL   Creatinine, Ser 1.16 0.76 - 1.27 mg/dL   GFR calc non Af Amer 58 (L) >59 mL/min/1.73   GFR calc Af Amer 66 >59 mL/min/1.73   BUN/Creatinine Ratio 21 10 - 24   Sodium 139 134 - 144 mmol/L   Potassium 4.6 3.5 - 5.2 mmol/L   Chloride 98 96 - 106 mmol/L   CO2 25 20 - 29 mmol/L   Calcium 9.6 8.6 - 10.2 mg/dL  LP+ALT+AST Piccolo, Waived  Result Value Ref Range   ALT (SGPT) Piccolo, Waived 19 10 - 47 U/L   AST (SGOT) Piccolo, Waived 27 11 - 38 U/L   Cholesterol Piccolo, Waived 156 <200 mg/dL  HDL Chol Piccolo, Waived 46 (L) >59 mg/dL   Triglycerides Piccolo,Waived 156 (H) <150 mg/dL   Chol/HDL Ratio Piccolo,Waive 3.4 mg/dL   LDL Chol Calc Piccolo Waived 79 <100 mg/dL   VLDL Chol Calc Piccolo,Waive 31 (H) <30 mg/dL      Assessment & Plan:   Problem List Items Addressed This Visit      Unprioritized   Hypoxia - Primary    Pulse ox decreased to 82% on ambulation.  Check CBC.  Get chest x-ray.  Refer for home O2 therapy      Relevant Orders   Ambulatory referral to ENT   CBC    Other Visit Diagnoses    Excessive ear wax, left       Despite multiple attempts.  Unable to irrigate ears.  Refer to ENT for further management   Relevant Orders   DG Chest 2 View       Follow up plan: Return for Following up with [name] in July.

## 2017-11-03 LAB — CBC
Hematocrit: 35.4 % — ABNORMAL LOW (ref 37.5–51.0)
Hemoglobin: 11.4 g/dL — ABNORMAL LOW (ref 13.0–17.7)
MCH: 30.6 pg (ref 26.6–33.0)
MCHC: 32.2 g/dL (ref 31.5–35.7)
MCV: 95 fL (ref 79–97)
Platelets: 262 10*3/uL (ref 150–450)
RBC: 3.72 x10E6/uL — ABNORMAL LOW (ref 4.14–5.80)
RDW: 12.6 % (ref 12.3–15.4)
WBC: 6.2 10*3/uL (ref 3.4–10.8)

## 2017-11-05 ENCOUNTER — Telehealth: Payer: Self-pay | Admitting: Family Medicine

## 2017-11-05 DIAGNOSIS — R0902 Hypoxemia: Secondary | ICD-10-CM

## 2017-11-05 NOTE — Telephone Encounter (Signed)
Copied from [place] [id number]. Topic: Quick Communication - See Telephone Encounter >> Nov 05, 2017 10:10 AM Rutherford Nail, NT wrote: CRM for notification. See Telephone encounter for: 11/05/17. Patient's daughter [name] calling and states that Kathrine Haddock was going to put in order for her father to receive oxygen. Would like an update. Please advise. CB#: [phone]

## 2017-11-05 NOTE — Telephone Encounter (Signed)
Put in an order in supplies.  Is there another order I need to put in?

## 2017-11-05 NOTE — Telephone Encounter (Signed)
rx

## 2017-11-06 ENCOUNTER — Encounter: Payer: PPO | Admitting: Speech Pathology

## 2017-11-06 ENCOUNTER — Ambulatory Visit: Payer: PPO | Admitting: Physical Therapy

## 2017-11-06 ENCOUNTER — Telehealth: Payer: Self-pay | Admitting: Family Medicine

## 2017-11-06 NOTE — Telephone Encounter (Signed)
Call [facility]

## 2017-11-06 NOTE — Telephone Encounter (Signed)
Copied from [place] [id number]. Topic: Quick Communication - See Telephone Encounter >> Nov 06, 2017 11:03 AM Boyd Kerbs wrote: CRM for notification. See Telephone encounter for: 11/06/17.   [name], daughter,  calling about lab results. Please call

## 2017-11-07 ENCOUNTER — Ambulatory Visit (INDEPENDENT_AMBULATORY_CARE_PROVIDER_SITE_OTHER): Payer: PPO

## 2017-11-07 ENCOUNTER — Encounter: Payer: Self-pay | Admitting: Emergency Medicine

## 2017-11-07 ENCOUNTER — Other Ambulatory Visit: Payer: Self-pay

## 2017-11-07 ENCOUNTER — Ambulatory Visit
Admission: EM | Admit: 2017-11-07 | Discharge: 2017-11-07 | Disposition: A | Discharge status: Home / Self Care | Payer: PPO | Attending: Family Medicine | Admitting: Family Medicine

## 2017-11-07 DIAGNOSIS — N179 Acute kidney failure, unspecified: Secondary | ICD-10-CM

## 2017-11-07 DIAGNOSIS — R5383 Other fatigue: Secondary | ICD-10-CM

## 2017-11-07 DIAGNOSIS — N289 Disorder of kidney and ureter, unspecified: Secondary | ICD-10-CM

## 2017-11-07 DIAGNOSIS — R531 Weakness: Secondary | ICD-10-CM | POA: Diagnosis not present

## 2017-11-07 LAB — CBC WITH DIFFERENTIAL/PLATELET
Basophils Absolute: 0.1 10*3/uL (ref 0–0.1)
Basophils Relative: 1 %
Eosinophils Absolute: 0.1 10*3/uL (ref 0–0.7)
Eosinophils Relative: 3 %
HCT: 35.9 % — ABNORMAL LOW (ref 40.0–52.0)
Hemoglobin: 12.1 g/dL — ABNORMAL LOW (ref 13.0–18.0)
Lymphocytes Relative: 32 %
Lymphs Abs: 1.6 10*3/uL (ref 1.0–3.6)
MCH: 31.3 pg (ref 26.0–34.0)
MCHC: 33.7 g/dL (ref 32.0–36.0)
MCV: 92.9 fL (ref 80.0–100.0)
Monocytes Absolute: 0.6 10*3/uL (ref 0.2–1.0)
Monocytes Relative: 11 %
Neutro Abs: 2.7 10*3/uL (ref 1.4–6.5)
Neutrophils Relative %: 53 %
Platelets: 247 10*3/uL (ref 150–440)
RBC: 3.86 MIL/uL — ABNORMAL LOW (ref 4.40–5.90)
RDW: 12.7 % (ref 11.5–14.5)
WBC: 5.1 10*3/uL (ref 3.8–10.6)

## 2017-11-07 LAB — URINALYSIS, COMPLETE (UACMP) WITH MICROSCOPIC
Bacteria, UA: NONE SEEN
Bilirubin Urine: NEGATIVE
Glucose, UA: 100 mg/dL — AB
Hgb urine dipstick: NEGATIVE
Ketones, ur: NEGATIVE mg/dL
Leukocytes, UA: NEGATIVE
Nitrite: NEGATIVE
Protein, ur: NEGATIVE mg/dL
RBC / HPF: NONE SEEN RBC/hpf (ref 0–5)
Specific Gravity, Urine: 1.015 (ref 1.005–1.030)
WBC, UA: NONE SEEN WBC/hpf (ref 0–5)
pH: 5.5 (ref 5.0–8.0)

## 2017-11-07 LAB — COMPREHENSIVE METABOLIC PANEL
ALT: 10 U/L — ABNORMAL LOW (ref 17–63)
AST: 21 U/L (ref 15–41)
Albumin: 4.3 g/dL (ref 3.5–5.0)
Alkaline Phosphatase: 87 U/L (ref 38–126)
Anion gap: 9 (ref 5–15)
BUN: 28 mg/dL — ABNORMAL HIGH (ref 6–20)
CO2: 28 mmol/L (ref 22–32)
Calcium: 9.5 mg/dL (ref 8.9–10.3)
Chloride: 102 mmol/L (ref 101–111)
Creatinine, Ser: 1.33 mg/dL — ABNORMAL HIGH (ref 0.61–1.24)
GFR calc Af Amer: 55 mL/min — ABNORMAL LOW (ref >60)
GFR calc non Af Amer: 47 mL/min — ABNORMAL LOW (ref >60)
Glucose, Bld: 162 mg/dL — ABNORMAL HIGH (ref 65–99)
Potassium: 5 mmol/L (ref 3.5–5.1)
Sodium: 139 mmol/L (ref 135–145)
Total Bilirubin: 0.6 mg/dL (ref 0.3–1.2)
Total Protein: 7.7 g/dL (ref 6.5–8.1)

## 2017-11-07 NOTE — Telephone Encounter (Signed)
ok 

## 2017-11-07 NOTE — ED Triage Notes (Signed)
Family reports patient feeling fatigue and weakness since Sunday.

## 2017-11-07 NOTE — Telephone Encounter (Signed)
Estill Bamberg with [employer] calling and states in order to fill the oxygen order request for this pt that the pt will need to come in for a visit to state he is no longer in a chronic state and that he will need to preform a 6 minute walk. She would like a call from the nurse for further details. CB#: [phone].

## 2017-11-07 NOTE — ED Provider Notes (Signed)
[facility]    CSN: [id number] Arrival date & time: 11/07/17  1747     History   Chief Complaint Chief Complaint  Patient presents with  . Fatigue    HPI Todd [name] is a 84 y.o. male.   84 yo male with chronic medical problems and on multiple chronic medications presents with a c/o acute worsening of chronic fatigue since last Friday (6 days). Denies any other symptoms. Denies fevers, chills, cough, chest pains, shortness of breath, urinary symptoms.   The history is provided by the patient.    Past Medical History:  Diagnosis Date  . Anxiety   . Aortic regurgitation    a. 03/2017 Echo: Mod AI; b. 05/2017 Echo: Triv AI.  [name] Kitchen Arthritis    osteoarthritis-left knee  . CAD (coronary artery disease)    a. Reported h/o cath ~ 2008 - ? small vessel dzs. No PCI performed; b. 03/2017 MV: EF 45-54%, no ischemia/infarct.  . Depression   . Diabetes mellitus without complication ([place])   . Diastolic dysfunction    a. 03/2017 Echo: EF 55-65%; b. 05/2017 Echo: EF 55-60%, no rwma, Gr1 DD. Triv AI.  Mildly dil LA.  . ED (erectile dysfunction)   . Elevated troponin    a. chronic mild elevation - 0.03->0.04.  [name] Kitchen GERD (gastroesophageal reflux disease)   . Glaucoma   . Hyperlipidemia   . Hypertension   . Iron deficiency anemia due to chronic blood loss   . Left pontine stroke w/ cerebrovascular disease(HCC)    a. 05/2017 MRI/A: acute to subacute L pontine infarct. Chronic left thalamic lacunar infarct. Severe basilar artery stenosis w/ radiographic string sign of the distal 1/3. Mild to moderate L PCA P2 segment stenosis; b. 05/2017 Carotid U/S: <50% bilat ICA stenosis.  . Lumbago    Lumbosacral Neuritis  . Prostate cancer [facility])     Patient Active Problem List   Diagnosis Date Noted  . Hypoxia 11/02/2017  . Mixed hyperlipidemia 09/21/2017  . Aphasia 05/18/2017  . CAD (coronary artery disease) 05/18/2017  . History of stroke with residual deficit 05/18/2017  .  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 Knee joint replaced by other means 07/04/2015  . Urinary incontinence 04/27/2015  . Anxiety, generalized 04/07/2015  . History of BCG vaccination 04/07/2015  . GERD (gastroesophageal reflux disease) 03/04/2015  . Depression 02/25/2015  . Degeneration of intervertebral disc of lumbar region 02/23/2015  . Neuritis or radiculitis due to rupture of lumbar intervertebral disc 02/23/2015  . Lumbar stenosis with neurogenic claudication 02/23/2015  . Hypertension 12/02/2014  . Diabetes mellitus without complication ([place]) 93/57/0177  . Benign essential tremor 03/13/2014  . Imbalance 03/13/2014  . Malignant neoplasm of prostate ([place]) 10/07/2012  . ED (erectile dysfunction) of organic origin 02/09/2012    Past Surgical History:  Procedure Laterality Date  . CARPAL TUNNEL RELEASE    . REPLACEMENT TOTAL KNEE BILATERAL         Home Medications    Prior to Admission medications   Medication Sig Start Date End Date Taking? Authorizing Provider  acetaminophen (TYLENOL 8 HOUR ARTHRITIS PAIN) 650 MG CR tablet Take 650 mg by mouth every 8 (eight) hours as needed for pain.   Yes [provider]  aspirin EC 81 MG tablet Take 81 mg by mouth daily.   Yes [provider]  atorvastatin (LIPITOR) 10 MG tablet Take 1 tablet (10 mg total) by mouth daily. 04/24/17  Yes Minna Merritts, MD  Blood Glucose  Monitoring Suppl (ACCU-CHEK AVIVA PLUS) w/Device KIT Use to check sugar levels x 2 daily 06/13/17  Yes Crissman, Jeannette How, MD  busPIRone (BUSPAR) 10 MG tablet Take 10 mg by mouth at bedtime.    Yes [provider]  clonazePAM (KLONOPIN) 0.5 MG tablet Take 1 tablet (0.5 mg total) by mouth daily as needed for anxiety. 05/21/17  Yes Sudini, Alveta Heimlich, MD  clopidogrel (PLAVIX) 75 MG tablet Take 1 tablet (75 mg total) by mouth daily. 07/10/17  Yes Guadalupe Maple, MD  DULoxetine (CYMBALTA) 20 MG capsule  10/04/17  Yes [provider]    gabapentin (NEURONTIN) 100 MG capsule Take 100 mg by mouth 2 (two) times daily. Once a day at night   Yes [provider]  hydrALAZINE (APRESOLINE) 25 MG tablet Take 1 tablet (25 mg total) by mouth 3 (three) times daily. 08/23/17  Yes Johnson, Megan P, DO  hydrochlorothiazide (HYDRODIURIL) 25 MG tablet Take 1 tablet (25 mg total) by mouth daily. 11/01/17  Yes Guadalupe Maple, MD  Lancets (ACCU-CHEK SOFT TOUCH) lancets Use as instructed 06/13/17  Yes Crissman, Jeannette How, MD  latanoprost (XALATAN) 0.005 % ophthalmic solution Place 1 drop into both eyes at bedtime.  06/08/16  Yes [provider]  metFORMIN (GLUCOPHAGE) 500 MG tablet Take 1 tablet (500 mg total) by mouth daily with breakfast. 01/09/17  Yes Johnson, Megan P, DO  naproxen sodium (ALEVE) 220 MG tablet Take 220 mg by mouth 2 (two) times daily as needed.    Yes [provider]  pantoprazole (PROTONIX) 40 MG tablet Take 1 tablet (40 mg total) by mouth daily. 04/24/17  Yes Gollan, Kathlene November, MD  propranolol ER (INDERAL LA) 80 MG 24 hr capsule Take 1 capsule (80 mg total) by mouth daily. 12/05/16  Yes Crissman, Jeannette How, MD  telmisartan (MICARDIS) 80 MG tablet Take 1 tablet (80 mg total) by mouth daily. 12/05/16  Yes Guadalupe Maple, MD    Family History Family History  Problem Relation Age of Onset  . Stroke Mother   . Hypertension Mother   . Hypertension Father   . Stroke Sister   . Prostate cancer Neg Hx   . Kidney cancer Neg Hx   . Bladder Cancer Neg Hx     Social History Social History   Tobacco Use  . Smoking status: Former Smoker    Last attempt to quit: 12/02/1994    Years since quitting: 22.9  . Smokeless tobacco: Never Used  Substance Use Topics  . Alcohol use: Yes    Alcohol/week: 0.6 oz    Types: 1 Cans of beer per week  . Drug use: No     Allergies   Amlodipine; Fluoxetine; and Meloxicam   Review of Systems Review of Systems   Physical Exam Triage Vital Signs ED Triage Vitals  Enc  Vitals Group     BP 11/07/17 1802 123/77     Pulse Rate 11/07/17 1802 84     Resp 11/07/17 1802 16     Temp 11/07/17 1802 97.7 F (36.5 C)     Temp Source 11/07/17 1802 Oral     SpO2 11/07/17 1802 98 %     Weight 11/07/17 1759 184 lb (83.5 kg)     Height 11/07/17 1759 5' 7"  (1.702 m)     Head Circumference --      Peak Flow --      Pain Score 11/07/17 1759 0     Pain Loc --  Pain Edu? --      Excl. in [place]? --    No data found.  Updated Vital Signs BP 123/77 (BP Location: Left Arm)   Pulse 84   Temp 97.7 F (36.5 C) (Oral)   Resp 16   Ht 5' 7"  (1.702 m)   Wt 184 lb (83.5 kg)   SpO2 98%   BMI 28.82 kg/m   Visual Acuity Right Eye Distance:   Left Eye Distance:   Bilateral Distance:    Right Eye Near:   Left Eye Near:    Bilateral Near:     Physical Exam  Constitutional: He appears well-developed and well-nourished. No distress.  HENT:  Head: Normocephalic and atraumatic.  Cardiovascular: Normal rate, regular rhythm, normal heart sounds and intact distal pulses.  No murmur heard. Pulmonary/Chest: Effort normal. No stridor. No respiratory distress. He has no wheezes. He has no rales.  Few diffuse rhonchi  Abdominal: Soft. Bowel sounds are normal. He exhibits no distension and no mass. There is no tenderness. There is no rebound and no guarding.  Neurological: He is alert.  Skin: No rash noted. He is not diaphoretic.  Nursing note and vitals reviewed.    UC Treatments / Results  Labs (all labs ordered are listed, but only abnormal results are displayed) Labs Reviewed  CBC WITH DIFFERENTIAL/PLATELET - Abnormal; Notable for the following components:      Result Value   RBC 3.86 (*)    Hemoglobin 12.1 (*)    HCT 35.9 (*)    All other components within normal limits  COMPREHENSIVE METABOLIC PANEL - Abnormal; Notable for the following components:   Glucose, Bld 162 (*)    BUN 28 (*)    Creatinine, Ser 1.33 (*)    ALT 10 (*)    GFR calc non Af Amer 47 (*)     GFR calc Af Amer 55 (*)    All other components within normal limits  URINALYSIS, COMPLETE (UACMP) WITH MICROSCOPIC - Abnormal; Notable for the following components:   Glucose, UA 100 (*)    All other components within normal limits    EKG None  Radiology Dg Chest 2 View  Result Date: 11/07/2017 CLINICAL DATA:  Fatigue.  Weakness. EXAM: CHEST - 2 VIEW COMPARISON:  Radiograph 07/16/2017 FINDINGS: Stable chronic elevation of right hemidiaphragm. Stable cardiomegaly and mediastinal contours. No pulmonary edema, focal airspace disease, pleural effusion or pneumothorax. Degenerative change in the spine and both shoulders. No acute osseous abnormalities. IMPRESSION: Unchanged mild cardiomegaly without acute chest finding. Electronically Signed   By: Jeb Levering M.D.   On: 11/07/2017 19:27    Procedures Procedures (including critical care time)  Medications Ordered in UC Medications - No data to display  Initial Impression / Assessment and Plan / UC Course  I have reviewed the triage vital signs and the nursing notes.  Pertinent labs & imaging results that were available during my care of the patient were reviewed by me and considered in my medical decision making (see chart for details).      Final Clinical Impressions(s) / UC Diagnoses   Final diagnoses:  Fatigue, unspecified type  Renal insufficiency, mild     Discharge Instructions     Increase water intake Follow up with Primary Care provider    ED Prescriptions    None     1. Labs/x-ray results and diagnosis reviewed with patient and family 2. Recommend supportive treatment with increased fluids 3. Follow up with PCP  4. Follow-up  prn if symptoms worsen or don't improve  Controlled Substance Prescriptions [place] Controlled Substance Registry consulted? Not Applicable   Norval Gable, MD 11/07/17 2059

## 2017-11-07 NOTE — Telephone Encounter (Signed)
Called and spoke to [place]. Patient will need another OV for O2. He must not have any issues ongoing during face to face. He must do a 6 minute walk test for insurance to approve.

## 2017-11-07 NOTE — Discharge Instructions (Signed)
Increase water intake Follow up with Primary Care provider

## 2017-11-08 ENCOUNTER — Ambulatory Visit: Payer: PPO | Admitting: Physical Therapy

## 2017-11-08 ENCOUNTER — Encounter: Payer: PPO | Admitting: Speech Pathology

## 2017-11-08 NOTE — Telephone Encounter (Signed)
Patient will have to have a face to face visit for insurance to cover O2. Must not have any other issues documented.  Please schedule.

## 2017-11-12 ENCOUNTER — Encounter: Payer: Self-pay | Admitting: Family Medicine

## 2017-11-12 ENCOUNTER — Ambulatory Visit: Payer: PPO | Admitting: Physical Therapy

## 2017-11-12 ENCOUNTER — Ambulatory Visit: Payer: PPO | Admitting: Speech Pathology

## 2017-11-12 ENCOUNTER — Ambulatory Visit (INDEPENDENT_AMBULATORY_CARE_PROVIDER_SITE_OTHER): Payer: PPO | Admitting: Family Medicine

## 2017-11-12 DIAGNOSIS — I2510 Atherosclerotic heart disease of native coronary artery without angina pectoris: Secondary | ICD-10-CM | POA: Diagnosis not present

## 2017-11-12 DIAGNOSIS — R4701 Aphasia: Secondary | ICD-10-CM

## 2017-11-12 NOTE — Telephone Encounter (Signed)
Pt seen in office and discussed w/ provider

## 2017-11-12 NOTE — Assessment & Plan Note (Signed)
Reviewed information and status with patient

## 2017-11-12 NOTE — Assessment & Plan Note (Signed)
CAD with poor O2 perfusion prescribing oxygen

## 2017-11-12 NOTE — Progress Notes (Addendum)
BP (!) 145/67   Pulse 80   SpO2 97% Comment: Before walk   Subjective:    Patient ID: Todd [name], male    DOB: [birth year], 84 y.o.   MRN: [id number]  HPI: Todd [name] is a 84 y.o. male  Chief Complaint  Patient presents with  . Face to face    Documentation needed for O2. 6 minute walk completed.   Patient with noticed marked shortness of breath with minimal exertion and ongoing fatigue.  This is seemingly getting worse and is stopped physical therapy and continues to have marked fatigue.  Oxygen levels have been measured and they have been low.  Here in the office for 6-minute walking test patient started at rest with oxygen level of 97% and with minimal walking approximately 30 m patient was unable to complete because his O2 dropped during walking to 84%. Because of the significant drop walking test was terminated.  Addendum 11/13/2017 Left out of the patient's walking test is his returned back to baseline which was measured but not recorded.  Patient returned to 97% of baseline 20 minutes later.  This was a slow recovery.  Patient otherwise stable on current medications which were reviewed. History assisted by daughter-in-law. This was a face-to-face visit with a 6-minute walk test. Patient was unable to complete the walk test due to dropping O2 fatigue patient went about 30 m.  Relevant past medical, surgical, family and social history reviewed and updated as indicated. Interim medical history since our last visit reviewed. Allergies and medications reviewed and updated.  Review of Systems  Constitutional: Negative.   Respiratory: Negative.   Cardiovascular: Negative.     Per HPI unless specifically indicated above     Objective:    BP (!) 145/67   Pulse 80   SpO2 97% Comment: Before walk  Wt Readings from Last 3 Encounters:  11/07/17 184 lb (83.5 kg)  11/02/17 184 lb 6.4 oz (83.6 kg)  10/26/17 186 lb (84.4 kg)    Physical Exam  Constitutional: He is oriented to  person, place, and time. He appears well-developed and well-nourished.  HENT:  Head: Normocephalic and atraumatic.  Eyes: Conjunctivae and EOM are normal.  Neck: Normal range of motion.  Cardiovascular: Normal rate, regular rhythm and normal heart sounds.  Pulmonary/Chest: Effort normal and breath sounds normal.  Musculoskeletal: Normal range of motion.  Neurological: He is alert and oriented to person, place, and time.  Skin: No erythema.  Psychiatric: He has a normal mood and affect. His behavior is normal. Judgment and thought content normal.    Results for orders placed or performed during the hospital encounter of 11/07/17  CBC with Differential  Result Value Ref Range   WBC 5.1 3.8 - 10.6 K/uL   RBC 3.86 (L) 4.40 - 5.90 MIL/uL   Hemoglobin 12.1 (L) 13.0 - 18.0 g/dL   HCT 35.9 (L) 40.0 - 52.0 %   MCV 92.9 80.0 - 100.0 fL   MCH 31.3 26.0 - 34.0 pg   MCHC 33.7 32.0 - 36.0 g/dL   RDW 12.7 11.5 - 14.5 %   Platelets 247 150 - 440 K/uL   Neutrophils Relative % 53 %   Neutro Abs 2.7 1.4 - 6.5 K/uL   Lymphocytes Relative 32 %   Lymphs Abs 1.6 1.0 - 3.6 K/uL   Monocytes Relative 11 %   Monocytes Absolute 0.6 0.2 - 1.0 K/uL   Eosinophils Relative 3 %   Eosinophils Absolute 0.1 0 - 0.7 K/uL  Basophils Relative 1 %   Basophils Absolute 0.1 0 - 0.1 K/uL  Comprehensive metabolic panel  Result Value Ref Range   Sodium 139 135 - 145 mmol/L   Potassium 5.0 3.5 - 5.1 mmol/L   Chloride 102 101 - 111 mmol/L   CO2 28 22 - 32 mmol/L   Glucose, Bld 162 (H) 65 - 99 mg/dL   BUN 28 (H) 6 - 20 mg/dL   Creatinine, Ser 1.33 (H) 0.61 - 1.24 mg/dL   Calcium 9.5 8.9 - 10.3 mg/dL   Total Protein 7.7 6.5 - 8.1 g/dL   Albumin 4.3 3.5 - 5.0 g/dL   AST 21 15 - 41 U/L   ALT 10 (L) 17 - 63 U/L   Alkaline Phosphatase 87 38 - 126 U/L   Total Bilirubin 0.6 0.3 - 1.2 mg/dL   GFR calc non Af Amer 47 (L) >60 mL/min   GFR calc Af Amer 55 (L) >60 mL/min   Anion gap 9 5 - 15  Urinalysis, Complete w  Microscopic  Result Value Ref Range   Color, Urine YELLOW YELLOW   APPearance CLEAR CLEAR   Specific Gravity, Urine 1.015 1.005 - 1.030   pH 5.5 5.0 - 8.0   Glucose, UA 100 (A) NEGATIVE mg/dL   Hgb urine dipstick NEGATIVE NEGATIVE   Bilirubin Urine NEGATIVE NEGATIVE   Ketones, ur NEGATIVE NEGATIVE mg/dL   Protein, ur NEGATIVE NEGATIVE mg/dL   Nitrite NEGATIVE NEGATIVE   Leukocytes, UA NEGATIVE NEGATIVE   Squamous Epithelial / LPF 0-5 0 - 5   WBC, UA NONE SEEN 0 - 5 WBC/hpf   RBC / HPF NONE SEEN 0 - 5 RBC/hpf   Bacteria, UA NONE SEEN NONE SEEN      Assessment & Plan:   Problem List Items Addressed This Visit      Cardiovascular and Mediastinum   CAD (coronary artery disease)    CAD with poor O2 perfusion prescribing oxygen        Other   Aphasia    Reviewed information and status with patient          Follow up plan: Return if symptoms worsen or fail to improve, for As scheduled.  Addendum Patient's oxygen level recovered after being placed on oxygen.  It went up greater than 1%.

## 2017-11-13 NOTE — Telephone Encounter (Signed)
Face to face from recent [place] faxed to [place] @ [phone]

## 2017-11-13 NOTE — Telephone Encounter (Signed)
Documentation needs to state how long it took for patients O2 to return to baseline for pt to qualify for o2

## 2017-11-13 NOTE — Telephone Encounter (Signed)
Documentation for baseline now included in the patient's note.  by the way patient's height was left out of his vitals

## 2017-11-13 NOTE — Telephone Encounter (Signed)
Todd [name] with [employer] calling to advise the pt still does not qualify for oxygen, because there was no recovery done with O2 in the testing.   [phone]

## 2017-11-14 ENCOUNTER — Encounter: Payer: PPO | Admitting: Speech Pathology

## 2017-11-14 ENCOUNTER — Ambulatory Visit: Payer: PPO | Admitting: Physical Therapy

## 2017-11-19 ENCOUNTER — Encounter: Payer: PPO | Admitting: Speech Pathology

## 2017-11-19 ENCOUNTER — Ambulatory Visit: Payer: PPO | Admitting: Physical Therapy

## 2017-11-19 ENCOUNTER — Telehealth: Payer: Self-pay | Admitting: Family Medicine

## 2017-11-19 DIAGNOSIS — H60331 Swimmer's ear, right ear: Secondary | ICD-10-CM | POA: Diagnosis not present

## 2017-11-19 DIAGNOSIS — H6123 Impacted cerumen, bilateral: Secondary | ICD-10-CM | POA: Diagnosis not present

## 2017-11-19 NOTE — Telephone Encounter (Signed)
Addendum faxed to [facility]

## 2017-11-19 NOTE — Telephone Encounter (Signed)
Todd [name] is going to fax a form over for reference.

## 2017-11-19 NOTE — Telephone Encounter (Signed)
Copied from [place] [phone]. Topic: Quick Communication - See Telephone Encounter >> Nov 19, 2017 10:14 AM Con[place] Chancy, NT wrote: CRM for notification. See Telephone encounter for: 11/19/17.  Estill Bamberg is calling from [place] and states the chart notes have been corrected but the recovery is still not on oxygen so she is unable to set the patient up.  Estill Bamberg Cb# [phone]

## 2017-11-19 NOTE — Telephone Encounter (Signed)
Call [place] and ask her specifically what the walk test consists of the steps acquired to complete it to their satisfaction.  If they have some form please ask her to fax the form.

## 2017-11-21 ENCOUNTER — Encounter: Payer: PPO | Admitting: Speech Pathology

## 2017-11-21 ENCOUNTER — Ambulatory Visit: Payer: PPO | Admitting: Physical Therapy

## 2017-11-26 ENCOUNTER — Ambulatory Visit: Payer: PPO | Admitting: Physical Therapy

## 2017-11-26 ENCOUNTER — Encounter: Payer: PPO | Admitting: Speech Pathology

## 2017-11-28 ENCOUNTER — Ambulatory Visit: Payer: PPO | Admitting: Physical Therapy

## 2017-11-28 ENCOUNTER — Encounter: Payer: PPO | Admitting: Speech Pathology

## 2017-12-03 ENCOUNTER — Telehealth: Payer: Self-pay | Admitting: Family Medicine

## 2017-12-03 ENCOUNTER — Encounter: Payer: PPO | Admitting: Speech Pathology

## 2017-12-03 ENCOUNTER — Ambulatory Visit: Payer: PPO | Admitting: Physical Therapy

## 2017-12-03 NOTE — Telephone Encounter (Signed)
Awaiting Dr. Rance Muir return tomorrow for completion on required documentation. Left message on machine for pt to return call to the office.

## 2017-12-03 NOTE — Telephone Encounter (Signed)
Copied from [place] [id number]. Topic: Quick Communication - See Telephone Encounter >> Dec 03, 2017 10:21 AM Rutherford Nail, NT wrote: CRM for notification. See Telephone encounter for: 12/03/17. Patient's daughter, Ardeen Fillers, calling to check on the status for getting oxygen. States that she has not heard anything. CB#: [phone]

## 2017-12-05 ENCOUNTER — Encounter: Payer: PPO | Admitting: Speech Pathology

## 2017-12-05 NOTE — Telephone Encounter (Signed)
Pt's daughter called back in to follow up. She is aware that PCP was out of the office but would like a call back with an update as soon as he's back in   CB: [phone] (home) OR [phone] (cell)

## 2017-12-05 NOTE — Telephone Encounter (Signed)
Let us discuss  this together and fill out the paperwork together

## 2017-12-05 NOTE — Telephone Encounter (Signed)
Please refer to staff message for needed documentation.

## 2017-12-10 ENCOUNTER — Ambulatory Visit: Payer: PPO | Admitting: Physical Therapy

## 2017-12-10 NOTE — Telephone Encounter (Signed)
No form to fill out. Current documentation is correct, just needs documented that patient's O2 level recovered on oxygen

## 2017-12-11 NOTE — Telephone Encounter (Signed)
Spoke with provider about documentation. Documentation was updated by provider and refaxed to [place]. Message left to notify [place].

## 2017-12-12 ENCOUNTER — Encounter: Payer: PPO | Admitting: Speech Pathology

## 2017-12-12 ENCOUNTER — Ambulatory Visit: Payer: PPO | Admitting: Physical Therapy

## 2017-12-17 ENCOUNTER — Encounter: Payer: Self-pay | Admitting: Emergency Medicine

## 2017-12-17 ENCOUNTER — Other Ambulatory Visit: Payer: Self-pay

## 2017-12-17 ENCOUNTER — Ambulatory Visit (INDEPENDENT_AMBULATORY_CARE_PROVIDER_SITE_OTHER): Payer: PPO

## 2017-12-17 ENCOUNTER — Ambulatory Visit
Admission: EM | Admit: 2017-12-17 | Discharge: 2017-12-17 | Disposition: A | Discharge status: Home / Self Care | Payer: PPO | Attending: Family Medicine | Admitting: Family Medicine

## 2017-12-17 DIAGNOSIS — D638 Anemia in other chronic diseases classified elsewhere: Secondary | ICD-10-CM | POA: Diagnosis not present

## 2017-12-17 DIAGNOSIS — R0602 Shortness of breath: Secondary | ICD-10-CM | POA: Diagnosis not present

## 2017-12-17 DIAGNOSIS — R52 Pain, unspecified: Secondary | ICD-10-CM | POA: Diagnosis not present

## 2017-12-17 DIAGNOSIS — M791 Myalgia, unspecified site: Secondary | ICD-10-CM

## 2017-12-17 DIAGNOSIS — J40 Bronchitis, not specified as acute or chronic: Secondary | ICD-10-CM | POA: Diagnosis not present

## 2017-12-17 LAB — CBC WITH DIFFERENTIAL/PLATELET
Basophils Absolute: 0.1 10*3/uL (ref 0–0.1)
Basophils Relative: 1 %
Eosinophils Absolute: 0.2 10*3/uL (ref 0–0.7)
Eosinophils Relative: 3 %
HCT: 32.2 % — ABNORMAL LOW (ref 40.0–52.0)
Hemoglobin: 10.8 g/dL — ABNORMAL LOW (ref 13.0–18.0)
Lymphocytes Relative: 22 %
Lymphs Abs: 1.4 10*3/uL (ref 1.0–3.6)
MCH: 31.9 pg (ref 26.0–34.0)
MCHC: 33.5 g/dL (ref 32.0–36.0)
MCV: 95.4 fL (ref 80.0–100.0)
Monocytes Absolute: 0.6 10*3/uL (ref 0.2–1.0)
Monocytes Relative: 10 %
Neutro Abs: 4 10*3/uL (ref 1.4–6.5)
Neutrophils Relative %: 64 %
Platelets: 238 10*3/uL (ref 150–440)
RBC: 3.37 MIL/uL — ABNORMAL LOW (ref 4.40–5.90)
RDW: 13.3 % (ref 11.5–14.5)
WBC: 6.2 10*3/uL (ref 3.8–10.6)

## 2017-12-17 LAB — URINALYSIS, COMPLETE (UACMP) WITH MICROSCOPIC
Bacteria, UA: NONE SEEN
Bilirubin Urine: NEGATIVE
Glucose, UA: 100 mg/dL — AB
Hgb urine dipstick: NEGATIVE
Ketones, ur: NEGATIVE mg/dL
Leukocytes, UA: NEGATIVE
Nitrite: NEGATIVE
Protein, ur: NEGATIVE mg/dL
Specific Gravity, Urine: 1.015 (ref 1.005–1.030)
pH: 5.5 (ref 5.0–8.0)

## 2017-12-17 LAB — BASIC METABOLIC PANEL
Anion gap: 11 (ref 5–15)
BUN: 24 mg/dL — ABNORMAL HIGH (ref 8–23)
CO2: 26 mmol/L (ref 22–32)
Calcium: 9.3 mg/dL (ref 8.9–10.3)
Chloride: 103 mmol/L (ref 98–111)
Creatinine, Ser: 1.11 mg/dL (ref 0.61–1.24)
GFR calc Af Amer: >60 mL/min (ref >60)
GFR calc non Af Amer: 59 mL/min — ABNORMAL LOW (ref >60)
Glucose, Bld: 208 mg/dL — ABNORMAL HIGH (ref 70–99)
Potassium: 4.4 mmol/L (ref 3.5–5.1)
Sodium: 140 mmol/L (ref 135–145)

## 2017-12-17 MED ORDER — DOXYCYCLINE HYCLATE 100 MG PO TABS: 100.0000 mg | ORAL_TABLET | Freq: Two times a day (BID) | ORAL | 0 refills | Status: DC

## 2017-12-17 NOTE — ED Triage Notes (Signed)
Patient c/o generalized body aches that started after he ate lunch today.

## 2017-12-17 NOTE — Discharge Instructions (Signed)
Rest, fluids, follow up with Primary Care provider

## 2017-12-17 NOTE — ED Provider Notes (Signed)
[facility]    CSN: [id number] Arrival date & time: 12/17/17  1450     History   Chief Complaint Chief Complaint  Patient presents with  . Generalized Body Aches    HPI Todd [name] is a 84 y.o. male.   84 yo male with a c/o body aches and fatigue that started today. Patient with a h/o multiple chronic medical problems including CVA accompanied by daughters. Denies chest pains or shortness of breath.   The history is provided by the patient and a relative.    Past Medical History:  Diagnosis Date  . Anxiety   . Aortic regurgitation    a. 03/2017 Echo: Mod AI; b. 05/2017 Echo: Triv AI.  [name] Kitchen Arthritis    osteoarthritis-left knee  . CAD (coronary artery disease)    a. Reported h/o cath ~ 2008 - ? small vessel dzs. No PCI performed; b. 03/2017 MV: EF 45-54%, no ischemia/infarct.  . Depression   . Diabetes mellitus without complication ([place])   . Diastolic dysfunction    a. 03/2017 Echo: EF 55-65%; b. 05/2017 Echo: EF 55-60%, no rwma, Gr1 DD. Triv AI.  Mildly dil LA.  . ED (erectile dysfunction)   . Elevated troponin    a. chronic mild elevation - 0.03->0.04.  [name] Kitchen GERD (gastroesophageal reflux disease)   . Glaucoma   . Hyperlipidemia   . Hypertension   . Iron deficiency anemia due to chronic blood loss   . Left pontine stroke w/ cerebrovascular disease(HCC)    a. 05/2017 MRI/A: acute to subacute L pontine infarct. Chronic left thalamic lacunar infarct. Severe basilar artery stenosis w/ radiographic string sign of the distal 1/3. Mild to moderate L PCA P2 segment stenosis; b. 05/2017 Carotid U/S: <50% bilat ICA stenosis.  . Lumbago    Lumbosacral Neuritis  . Prostate cancer [facility])     Patient Active Problem List   Diagnosis Date Noted  . Hypoxia 11/02/2017  . Mixed hyperlipidemia 09/21/2017  . Aphasia 05/18/2017  . CAD (coronary artery disease) 05/18/2017  . History of stroke with residual deficit 05/18/2017  . 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 Knee joint replaced by other means 07/04/2015  . Urinary incontinence 04/27/2015  . Anxiety, generalized 04/07/2015  . History of BCG vaccination 04/07/2015  . GERD (gastroesophageal reflux disease) 03/04/2015  . Depression 02/25/2015  . Degeneration of intervertebral disc of lumbar region 02/23/2015  . Neuritis or radiculitis due to rupture of lumbar intervertebral disc 02/23/2015  . Lumbar stenosis with neurogenic claudication 02/23/2015  . Hypertension 12/02/2014  . Diabetes mellitus without complication ([place]) 56/25/6389  . Benign essential tremor 03/13/2014  . Imbalance 03/13/2014  . Malignant neoplasm of prostate ([place]) 10/07/2012  . ED (erectile dysfunction) of organic origin 02/09/2012    Past Surgical History:  Procedure Laterality Date  . CARPAL TUNNEL RELEASE    . REPLACEMENT TOTAL KNEE BILATERAL         Home Medications    Prior to Admission medications   Medication Sig Start Date End Date Taking? Authorizing Provider  acetaminophen (TYLENOL 8 HOUR ARTHRITIS PAIN) 650 MG CR tablet Take 650 mg by mouth every 8 (eight) hours as needed for pain.   Yes [provider]  aspirin EC 81 MG tablet Take 81 mg by mouth daily.   Yes [provider]  atorvastatin (LIPITOR) 10 MG tablet Take 1 tablet (10 mg total) by mouth daily. 04/24/17  Yes Minna Merritts, MD  Blood Glucose Monitoring Suppl (  ACCU-CHEK AVIVA PLUS) w/Device KIT Use to check sugar levels x 2 daily 06/13/17  Yes Crissman, Jeannette How, MD  busPIRone (BUSPAR) 10 MG tablet Take 10 mg by mouth at bedtime.    Yes [provider]  clonazePAM (KLONOPIN) 0.5 MG tablet Take 1 tablet (0.5 mg total) by mouth daily as needed for anxiety. 05/21/17  Yes Sudini, Alveta Heimlich, MD  clopidogrel (PLAVIX) 75 MG tablet Take 1 tablet (75 mg total) by mouth daily. 07/10/17  Yes Guadalupe Maple, MD  DULoxetine (CYMBALTA) 20 MG capsule  10/04/17  Yes [provider]  gabapentin (NEURONTIN) 100 MG capsule Take 100  mg by mouth 2 (two) times daily. Once a day at night   Yes [provider]  hydrALAZINE (APRESOLINE) 25 MG tablet Take 1 tablet (25 mg total) by mouth 3 (three) times daily. 08/23/17  Yes Johnson, Megan P, DO  hydrochlorothiazide (HYDRODIURIL) 25 MG tablet Take 1 tablet (25 mg total) by mouth daily. 11/01/17  Yes Guadalupe Maple, MD  Lancets (ACCU-CHEK SOFT TOUCH) lancets Use as instructed 06/13/17  Yes Crissman, Jeannette How, MD  latanoprost (XALATAN) 0.005 % ophthalmic solution Place 1 drop into both eyes at bedtime.  06/08/16  Yes [provider]  metFORMIN (GLUCOPHAGE) 500 MG tablet Take 1 tablet (500 mg total) by mouth daily with breakfast. 01/09/17  Yes Johnson, Megan P, DO  naproxen sodium (ALEVE) 220 MG tablet Take 220 mg by mouth 2 (two) times daily as needed.    Yes [provider]  pantoprazole (PROTONIX) 40 MG tablet Take 1 tablet (40 mg total) by mouth daily. 04/24/17  Yes Gollan, Kathlene November, MD  propranolol ER (INDERAL LA) 80 MG 24 hr capsule Take 1 capsule (80 mg total) by mouth daily. 12/05/16  Yes Crissman, Jeannette How, MD  telmisartan (MICARDIS) 80 MG tablet Take 1 tablet (80 mg total) by mouth daily. 12/05/16  Yes Crissman, Jeannette How, MD  doxycycline (VIBRA-TABS) 100 MG tablet Take 1 tablet (100 mg total) by mouth 2 (two) times daily. 12/17/17   Norval Gable, MD    Family History Family History  Problem Relation Age of Onset  . Stroke Mother   . Hypertension Mother   . Hypertension Father   . Stroke Sister   . Prostate cancer Neg Hx   . Kidney cancer Neg Hx   . Bladder Cancer Neg Hx     Social History Social History   Tobacco Use  . Smoking status: Former Smoker    Last attempt to quit: 12/02/1994    Years since quitting: 23.0  . Smokeless tobacco: Never Used  Substance Use Topics  . Alcohol use: Yes    Alcohol/week: 0.6 oz    Types: 1 Cans of beer per week  . Drug use: No     Allergies   Amlodipine; Fluoxetine; and Meloxicam   Review of  Systems Review of Systems   Physical Exam Triage Vital Signs ED Triage Vitals  Enc Vitals Group     BP 12/17/17 1528 137/68     Pulse Rate 12/17/17 1528 71     Resp 12/17/17 1528 16     Temp 12/17/17 1528 (!) 97.5 F (36.4 C)     Temp Source 12/17/17 1528 Oral     SpO2 12/17/17 1528 99 %     Weight 12/17/17 1525 186 lb (84.4 kg)     Height 12/17/17 1525 _0  (1.702 m)     Head Circumference --  Peak Flow --      Pain Score 12/17/17 1525 10     Pain Loc --      Pain Edu? --      Excl. in [place]? --    No data found.  Updated Vital Signs BP 137/68 (BP Location: Right Arm)   Pulse 71   Temp (!) 97.5 F (36.4 C) (Oral)   Resp 16   Ht _0  (1.702 m)   Wt 186 lb (84.4 kg)   SpO2 99%   BMI 29.13 kg/m   Visual Acuity Right Eye Distance:   Left Eye Distance:   Bilateral Distance:    Right Eye Near:   Left Eye Near:    Bilateral Near:     Physical Exam  Constitutional: He appears well-developed and well-nourished. No distress.  HENT:  Head: Normocephalic and atraumatic.  Nose: Nose normal.  Mouth/Throat: Uvula is midline, oropharynx is clear and moist and mucous membranes are normal. No oropharyngeal exudate or tonsillar abscesses. No tonsillar exudate.  Eyes: Pupils are equal, round, and reactive to light. Conjunctivae and EOM are normal. Right eye exhibits no discharge. Left eye exhibits no discharge. No scleral icterus.  Neck: Normal range of motion. Neck supple. No tracheal deviation present. No thyromegaly present.  Cardiovascular: Normal rate, regular rhythm and normal heart sounds.  Pulmonary/Chest: Effort normal. No stridor. No respiratory distress. He has no wheezes. He has no rales. He exhibits no tenderness.  Few rhonchi  Lymphadenopathy:    He has no cervical adenopathy.  Neurological: He is alert.  Skin: Skin is warm and dry. No rash noted. He is not diaphoretic.  Nursing note and vitals reviewed.    UC Treatments / Results  Labs (all labs  ordered are listed, but only abnormal results are displayed) Labs Reviewed  CBC WITH DIFFERENTIAL/PLATELET - Abnormal; Notable for the following components:      Result Value   RBC 3.37 (*)    Hemoglobin 10.8 (*)    HCT 32.2 (*)    All other components within normal limits  BASIC METABOLIC PANEL - Abnormal; Notable for the following components:   Glucose, Bld 208 (*)    BUN 24 (*)    GFR calc non Af Amer 59 (*)    All other components within normal limits  URINALYSIS, COMPLETE (UACMP) WITH MICROSCOPIC - Abnormal; Notable for the following components:   Glucose, UA 100 (*)    All other components within normal limits    EKG None  Radiology Dg Chest 2 View  Result Date: 12/17/2017 CLINICAL DATA:  84 year old presenting with acute generalized weakness and shortness of breath. Former smoker. EXAM: CHEST - 2 VIEW COMPARISON:  11/07/2017, 07/16/2017 and earlier. FINDINGS: Cardiac silhouette mildly to moderately enlarged, unchanged. Thoracic aorta mildly tortuous and atherosclerotic, unchanged. Hilar and mediastinal contours otherwise unremarkable. Chronic elevation of the RIGHT hemidiaphragm with chronic scar/atelectasis involving the RIGHT MIDDLE LOBE and RIGHT LOWER LOBE. Mild central peribronchial thickening, more so than on the most recent prior examination 11/07/2017. Lungs otherwise clear. No localized airspace consolidation. No pleural effusions. No pneumothorax. Normal pulmonary vascularity. 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 IMPRESSION: 1. Mild changes of acute bronchitis and/or asthma without evidence of focal airspace pneumonia. No acute cardiopulmonary disease otherwise. 2. Stable chronic elevation of the RIGHT hemidiaphragm and chronic scar/atelectasis involving the RIGHT MIDDLE LOBE and RIGHT LOWER LOBE. 3. Stable cardiomegaly without evidence of pulmonary edema. Electronically Signed   By: Evangeline Dakin  M.D.   On: 12/17/2017 16:51     Procedures Procedures (including critical care time)  Medications Ordered in UC Medications - No data to display  Initial Impression / Assessment and Plan / UC Course  I have reviewed the triage vital signs and the nursing notes.  Pertinent labs & imaging results that were available during my care of the patient were reviewed by me and considered in my medical decision making (see chart for details).      Final Clinical Impressions(s) / UC Diagnoses   Final diagnoses:  Bronchitis  Body aches  Anemia with chronic illness     Discharge Instructions     Rest, fluids, follow up with Primary Care provider    ED Prescriptions    Medication Sig Dispense Auth. Provider   doxycycline (VIBRA-TABS) 100 MG tablet Take 1 tablet (100 mg total) by mouth 2 (two) times daily. 20 tablet Norval Gable, MD      1. Labs/x-ray results and diagnosis reviewed with patient and family 2. rx as per orders above; reviewed possible side effects, interactions, risks and benefits  3. Recommend supportive treatment with rest, fluids 4. F/u with PCP  5. Follow-up prn if symptoms worsen or don't improve Controlled Substance Prescriptions [place] Controlled Substance Registry consulted? Not Applicable   Norval Gable, MD 12/17/17 [phone]

## 2017-12-18 ENCOUNTER — Ambulatory Visit: Payer: PPO | Admitting: Physical Therapy

## 2017-12-18 NOTE — Telephone Encounter (Signed)
New Order form for O2 completed w/ patient demographics, and given to provider for completion and signature. Will fax once completed.   [place], [place] [address] [phone]

## 2017-12-18 NOTE — Telephone Encounter (Addendum)
Have discussed concerns over issues with O2 with manager. Order was resent to [place] via online portal. [facility] was called and I spoke to [place]. Tiffany stated that Estill Bamberg (CSR taking care of Mr. Kiss) is on vacation, however the original order was cancelled on the 10th after documentation was incorrect and 30 days lapsed. Tiffany stated they would call me back after someone was able to review the new order.   [place] [place], [place] [address] Phone: [phone] FAX: [phone]

## 2017-12-18 NOTE — Telephone Encounter (Signed)
Order, demographic, insurance, and OV notes printed and faxed to [facility].

## 2017-12-18 NOTE — Telephone Encounter (Signed)
Spoke with Todd [name] to advise about change in company.

## 2017-12-18 NOTE — Telephone Encounter (Signed)
Daughter, Sharion Dove, checking on order, they have not heard from [place]. Please advise. Call back [phone]

## 2017-12-18 NOTE — Telephone Encounter (Signed)
However using some by other than [employer] they seem to be very difficult to work with

## 2017-12-19 NOTE — Telephone Encounter (Signed)
O2 testing is no longer valid. Must be done w/ in 30 days of order was done on 11/12/17. Patient will have to have another test. Please advise.

## 2017-12-20 ENCOUNTER — Ambulatory Visit (INDEPENDENT_AMBULATORY_CARE_PROVIDER_SITE_OTHER): Payer: PPO | Admitting: Family Medicine

## 2017-12-20 ENCOUNTER — Encounter: Payer: Self-pay | Admitting: Family Medicine

## 2017-12-20 VITALS — BP 131/71 | HR 73 | Ht 67.0 in | Wt 186.0 lb

## 2017-12-20 DIAGNOSIS — F411 Generalized anxiety disorder: Secondary | ICD-10-CM

## 2017-12-20 DIAGNOSIS — E119 Type 2 diabetes mellitus without complications: Secondary | ICD-10-CM

## 2017-12-20 DIAGNOSIS — Z7189 Other specified counseling: Secondary | ICD-10-CM | POA: Insufficient documentation

## 2017-12-20 DIAGNOSIS — Z0001 Encounter for general adult medical examination with abnormal findings: Secondary | ICD-10-CM | POA: Diagnosis not present

## 2017-12-20 DIAGNOSIS — I10 Essential (primary) hypertension: Secondary | ICD-10-CM

## 2017-12-20 DIAGNOSIS — I6930 Unspecified sequelae of cerebral infarction: Secondary | ICD-10-CM | POA: Diagnosis not present

## 2017-12-20 DIAGNOSIS — D500 Iron deficiency anemia secondary to blood loss (chronic): Secondary | ICD-10-CM | POA: Diagnosis not present

## 2017-12-20 MED ORDER — HYDROCHLOROTHIAZIDE 25 MG PO TABS: 25.0000 mg | ORAL_TABLET | Freq: Every day | ORAL | 4 refills | Status: DC

## 2017-12-20 MED ORDER — CLOPIDOGREL BISULFATE 75 MG PO TABS: 75.0000 mg | ORAL_TABLET | Freq: Every day | ORAL | 4 refills | Status: DC

## 2017-12-20 MED ORDER — TELMISARTAN 80 MG PO TABS: 80.0000 mg | ORAL_TABLET | Freq: Every day | ORAL | 4 refills | Status: DC

## 2017-12-20 MED ORDER — METFORMIN HCL 500 MG PO TABS: 500.0000 mg | ORAL_TABLET | Freq: Every day | ORAL | 4 refills | Status: DC

## 2017-12-20 MED ORDER — PROPRANOLOL HCL ER 80 MG PO CP24: 80.0000 mg | ORAL_CAPSULE | Freq: Every day | ORAL | 4 refills | Status: DC

## 2017-12-20 NOTE — Assessment & Plan Note (Signed)
The current medical regimen is effective;  continue present plan and medications.  

## 2017-12-20 NOTE — Assessment & Plan Note (Signed)
A voluntary discussion about advanced care planning including explanation and discussion of advanced directives was extentively discussed with the patient.  Explained about the healthcare proxy and living will was reviewed and packet with forms with expiration of how to fill them out was given.  Time spent: Encounter 16+ min individuals present: Patient Patient informs me this is previously done

## 2017-12-20 NOTE — Assessment & Plan Note (Signed)
Has taken speech therapy with no change or improvement

## 2017-12-20 NOTE — Assessment & Plan Note (Signed)
Followed by other doctors has been recommended to take iron

## 2017-12-20 NOTE — Progress Notes (Addendum)
BP 131/71   Pulse 73   Ht 5\' 7"  (1.702 m)   Wt 186 lb (84.4 kg)   SpO2 97%   BMI 29.13 kg/m    Subjective:    Patient ID: Todd [name], male    DOB: February 28, 1931, 84 y.o.   MRN: [id number]  HPI: Todd [name] is a 84 y.o. male  Chief Complaint  Patient presents with  . Annual Exam  . Shortness of Breath   Patient accompanied by his daughters who assist with history patient aphasic. On review and discussion with patient communication with his daughters and patient shaking his head otherwise doing well not really happy with oxygen that we put on him. With activity patient's oxygen levels go down at rest stabilized Other medication blood pressure stable.  Oxygen notes are noted below.  Blood work is been done in the emergency room and other doctors office which are managing other conditions is stable.  No further blood work needs to be done at this office at this time. Relevant past medical, surgical, family and social history reviewed and updated as indicated. Interim medical history since our last visit reviewed. Allergies and medications reviewed and updated.  Review of Systems  Unable to perform ROS: Patient nonverbal    Per HPI unless specifically indicated above     Objective:    BP 131/71   Pulse 73   Ht 5\' 7"  (1.702 m)   Wt 186 lb (84.4 kg)   SpO2 97%   BMI 29.13 kg/m   Wt Readings from Last 3 Encounters:  12/20/17 186 lb (84.4 kg)  12/17/17 186 lb (84.4 kg)  11/07/17 184 lb (83.5 kg)    Physical Exam  Constitutional: He is oriented to person, place, and time. He appears well-developed and well-nourished.  HENT:  Head: Normocephalic.  Right Ear: External ear normal.  Left Ear: External ear normal.  Nose: Nose normal.  Eyes: Pupils are equal, round, and reactive to light. Conjunctivae and EOM are normal.  Neck: Normal range of motion. Neck supple. No thyromegaly present.  Cardiovascular: Normal rate, regular rhythm, normal heart sounds and intact distal  pulses.  Pulmonary/Chest: Effort normal and breath sounds normal.  Abdominal: Soft. Bowel sounds are normal. There is no splenomegaly or hepatomegaly.  Genitourinary: Penis normal.  Musculoskeletal: Normal range of motion.  Lymphadenopathy:    He has no cervical adenopathy.  Neurological: He is alert and oriented to person, place, and time. He has normal reflexes.  Skin: Skin is warm and dry.  Psychiatric: He has a normal mood and affect. His behavior is normal. Judgment and thought content normal.        Assessment & Plan:   Problem List Items Addressed This Visit      Cardiovascular and Mediastinum   Hypertension    The current medical regimen is effective;  continue present plan and medications.       Relevant Medications   telmisartan (MICARDIS) 80 MG tablet   propranolol ER (INDERAL LA) 80 MG 24 hr capsule   hydrochlorothiazide (HYDRODIURIL) 25 MG tablet     Endocrine   Diabetes mellitus without complication (HCC)    The current medical regimen is effective;  continue present plan and medications.'      Relevant Medications   telmisartan (MICARDIS) 80 MG tablet   metFORMIN (GLUCOPHAGE) 500 MG tablet     Other   Anxiety, generalized    The current medical regimen is effective;  continue present plan and medications.  History of stroke with residual deficit    Has taken speech therapy with no change or improvement      Iron deficiency anemia due to chronic blood loss    Followed by other doctors has been recommended to take iron      Advanced care planning/counseling discussion - Primary    A voluntary discussion about advanced care planning including explanation and discussion of advanced directives was extentively discussed with the patient.  Explained about the healthcare proxy and living will was reviewed and packet with forms with expiration of how to fill them out was given.  Time spent: Encounter 16+ min individuals present: Patient Patient informs  me this is previously done          Follow up plan: Return in about 6 months (around 06/22/2018) for BMP,  Lipids, ALT, AST.     Explained to the patient 6 minute walk,  "The object of this test is to walk as far as possible for 6 minutes. You will walk back and forth in this hallway. Six minutes is a long time to walk, so you will be exerting yourself. You will probably get out of breath or become exhausted. You are permitted to slow down, to stop, and to rest as necessary. You may lean against the wall while resting, but resume walking as soon as you're able.  Remember that object is to walk as far as possible for 6 minutes, but don't run or job. You can start now. "  Supplemental O2: no, walk done on room air  Pre-test/Baseline: BP: 131/71 HR: 73 SpO2: 97 Dyspnea (BORG): 4 Fatigue (BORG): 0   End of Test:  BP: 170/84  HR: 92 SpO2: 88 Dyspnea (BORG): 6 Fatigue (BORG):4  Stopped before 6 minutes were completed? yes, reason: Patient unable to continue walking safely.   Other symptoms at the end of the test: angina No, dizziness No, hip/knee/calf pain No  Addendum for clarity for Medicare. Patient was placed on oxygen 2 L/min and was walked to his room and prior to sitting while still walking his O2 recovered to 91%.   SpO2 did recover > 1% with supplemental oxygen 2 L/min

## 2017-12-21 ENCOUNTER — Encounter: Payer: PPO | Admitting: Speech Pathology

## 2017-12-25 ENCOUNTER — Ambulatory Visit: Payer: PPO | Admitting: Physical Therapy

## 2017-12-25 ENCOUNTER — Encounter: Payer: PPO | Admitting: Speech Pathology

## 2017-12-27 ENCOUNTER — Encounter: Payer: PPO | Admitting: Family Medicine

## 2017-12-28 ENCOUNTER — Encounter: Payer: PPO | Admitting: Speech Pathology

## 2018-01-01 ENCOUNTER — Ambulatory Visit: Payer: PPO | Admitting: Physical Therapy

## 2018-01-02 ENCOUNTER — Encounter: Payer: Self-pay | Admitting: Family Medicine

## 2018-01-02 DIAGNOSIS — H401131 Primary open-angle glaucoma, bilateral, mild stage: Secondary | ICD-10-CM | POA: Diagnosis not present

## 2018-01-02 LAB — HM DIABETES EYE EXAM

## 2018-01-04 ENCOUNTER — Ambulatory Visit
Admission: EM | Admit: 2018-01-04 | Discharge: 2018-01-04 | Disposition: A | Discharge status: Home / Self Care | Payer: PPO | Attending: Family Medicine | Admitting: Family Medicine

## 2018-01-04 ENCOUNTER — Other Ambulatory Visit: Payer: Self-pay

## 2018-01-04 ENCOUNTER — Encounter: Payer: PPO | Admitting: Speech Pathology

## 2018-01-04 ENCOUNTER — Encounter: Payer: Self-pay | Admitting: Emergency Medicine

## 2018-01-04 DIAGNOSIS — R5383 Other fatigue: Secondary | ICD-10-CM | POA: Diagnosis not present

## 2018-01-04 DIAGNOSIS — M791 Myalgia, unspecified site: Secondary | ICD-10-CM

## 2018-01-04 NOTE — Telephone Encounter (Signed)
Patient's daughter, Ardeen Fillers, calling to check on the status for getting oxygen. The patient was seen on 12-20-17 and tested for O2.Ardeen Fillers is requesting a nurse to give her a call back in regards to this. Please  Advise CB# [phone]

## 2018-01-04 NOTE — ED Provider Notes (Signed)
[facility]    CSN: [id number] Arrival date & time: 01/04/18  1420     History   Chief Complaint Chief Complaint  Patient presents with  . Generalized Body Aches    HPI Todd [name] is a 84 y.o. male.   84 yo male with a multiple chronic medical problems presents with a c/o "not feeling good" after eating today. Denies any vomiting, fevers, chills, diarrhea. Has a h/o constipation. States last BM was 2 days ago. Patient was seen here about 2 weeks ago with similar complaints and work up unremarkable.   The history is provided by the patient.    Past Medical History:  Diagnosis Date  . Anxiety   . Aortic regurgitation    a. 03/2017 Echo: Mod AI; b. 05/2017 Echo: Triv AI.  [name] Kitchen Arthritis    osteoarthritis-left knee  . CAD (coronary artery disease)    a. Reported h/o cath ~ 2008 - ? small vessel dzs. No PCI performed; b. 03/2017 MV: EF 45-54%, no ischemia/infarct.  . Depression   . Diabetes mellitus without complication ([place])   . Diastolic dysfunction    a. 03/2017 Echo: EF 55-65%; b. 05/2017 Echo: EF 55-60%, no rwma, Gr1 DD. Triv AI.  Mildly dil LA.  . ED (erectile dysfunction)   . Elevated troponin    a. chronic mild elevation - 0.03->0.04.  [name] Kitchen GERD (gastroesophageal reflux disease)   . Glaucoma   . Hyperlipidemia   . Hypertension   . Iron deficiency anemia due to chronic blood loss   . Left pontine stroke w/ cerebrovascular disease(HCC)    a. 05/2017 MRI/A: acute to subacute L pontine infarct. Chronic left thalamic lacunar infarct. Severe basilar artery stenosis w/ radiographic string sign of the distal 1/3. Mild to moderate L PCA P2 segment stenosis; b. 05/2017 Carotid U/S: <50% bilat ICA stenosis.  . Lumbago    Lumbosacral Neuritis  . Prostate cancer [facility])     Patient Active Problem List   Diagnosis Date Noted  . Iron deficiency anemia due to chronic blood loss 12/20/2017  . Advanced care planning/counseling discussion 12/20/2017  . Hypoxia 11/02/2017    . Mixed hyperlipidemia 09/21/2017  . Aphasia 05/18/2017  . CAD (coronary artery disease) 05/18/2017  . History of stroke with residual deficit 05/18/2017  . 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 Knee joint replaced by other means 07/04/2015  . Urinary incontinence 04/27/2015  . Anxiety, generalized 04/07/2015  . History of BCG vaccination 04/07/2015  . GERD (gastroesophageal reflux disease) 03/04/2015  . Depression 02/25/2015  . Degeneration of intervertebral disc of lumbar region 02/23/2015  . Neuritis or radiculitis due to rupture of lumbar intervertebral disc 02/23/2015  . Lumbar stenosis with neurogenic claudication 02/23/2015  . Hypertension 12/02/2014  . Diabetes mellitus without complication ([place]) 54/00/8676  . Benign essential tremor 03/13/2014  . Imbalance 03/13/2014  . Malignant neoplasm of prostate ([place]) 10/07/2012  . ED (erectile dysfunction) of organic origin 02/09/2012    Past Surgical History:  Procedure Laterality Date  . CARPAL TUNNEL RELEASE    . REPLACEMENT TOTAL KNEE BILATERAL         Home Medications    Prior to Admission medications   Medication Sig Start Date End Date Taking? Authorizing Provider  acetaminophen (TYLENOL 8 HOUR ARTHRITIS PAIN) 650 MG CR tablet Take 650 mg by mouth every 8 (eight) hours as needed for pain.   Yes [provider]  aspirin EC 81 MG tablet Take 81 mg by mouth daily.  Yes [provider]  atorvastatin (LIPITOR) 10 MG tablet Take 1 tablet (10 mg total) by mouth daily. 04/24/17  Yes Gollan, Kathlene November, MD  busPIRone (BUSPAR) 10 MG tablet Take 10 mg by mouth at bedtime.    Yes [provider]  clopidogrel (PLAVIX) 75 MG tablet Take 1 tablet (75 mg total) by mouth daily. 12/20/17  Yes Guadalupe Maple, MD  DULoxetine (CYMBALTA) 20 MG capsule  10/04/17  Yes [provider]  gabapentin (NEURONTIN) 100 MG capsule Take 100 mg by mouth 2 (two) times daily. Once a day at night   Yes  [provider]  hydrALAZINE (APRESOLINE) 25 MG tablet Take 1 tablet (25 mg total) by mouth 3 (three) times daily. 08/23/17  Yes Johnson, Megan P, DO  hydrochlorothiazide (HYDRODIURIL) 25 MG tablet Take 1 tablet (25 mg total) by mouth daily. 12/20/17  Yes Crissman, Jeannette How, MD  latanoprost (XALATAN) 0.005 % ophthalmic solution Place 1 drop into both eyes at bedtime.  06/08/16  Yes [provider]  metFORMIN (GLUCOPHAGE) 500 MG tablet Take 1 tablet (500 mg total) by mouth daily with breakfast. 12/20/17  Yes Crissman, Jeannette How, MD  naproxen sodium (ALEVE) 220 MG tablet Take 220 mg by mouth 2 (two) times daily as needed.    Yes [provider]  pantoprazole (PROTONIX) 40 MG tablet Take 1 tablet (40 mg total) by mouth daily. 04/24/17  Yes Gollan, Kathlene November, MD  propranolol ER (INDERAL LA) 80 MG 24 hr capsule Take 1 capsule (80 mg total) by mouth daily. 12/20/17  Yes Crissman, Jeannette How, MD  telmisartan (MICARDIS) 80 MG tablet Take 1 tablet (80 mg total) by mouth daily. 12/20/17  Yes Crissman, Jeannette How, MD  Blood Glucose Monitoring Suppl (ACCU-CHEK AVIVA PLUS) w/Device KIT Use to check sugar levels x 2 daily 06/13/17   Guadalupe Maple, MD  clonazePAM (KLONOPIN) 0.5 MG tablet Take 1 tablet (0.5 mg total) by mouth daily as needed for anxiety. 05/21/17   Hillary Bow, MD  doxycycline (VIBRAMYCIN) 100 MG capsule  12/17/17   [provider]  Lancets (ACCU-CHEK SOFT TOUCH) lancets Use as instructed 06/13/17   Guadalupe Maple, MD    Family History Family History  Problem Relation Age of Onset  . Stroke Mother   . Hypertension Mother   . Hypertension Father   . Stroke Sister   . Prostate cancer Neg Hx   . Kidney cancer Neg Hx   . Bladder Cancer Neg Hx     Social History Social History   Tobacco Use  . Smoking status: Former Smoker    Last attempt to quit: 12/02/1994    Years since quitting: 23.1  . Smokeless tobacco: Never Used  Substance Use Topics  . Alcohol use: Yes     Alcohol/week: 0.6 oz    Types: 1 Cans of beer per week  . Drug use: No     Allergies   Amlodipine; Fluoxetine; and Meloxicam   Review of Systems Review of Systems   Physical Exam Triage Vital Signs ED Triage Vitals  Enc Vitals Group     BP 01/04/18 1437 135/69     Pulse Rate 01/04/18 1437 71     Resp 01/04/18 1437 16     Temp 01/04/18 1437 97.9 F (36.6 C)     Temp Source 01/04/18 1437 Oral     SpO2 01/04/18 1437 97 %     Weight 01/04/18 1438 186 lb (84.4 kg)  Height 01/04/18 1438 5' 7"  (1.702 m)     Head Circumference --      Peak Flow --      Pain Score 01/04/18 1438 0     Pain Loc --      Pain Edu? --      Excl. in [place]? --    No data found.  Updated Vital Signs BP 135/69 (BP Location: Left Arm)   Pulse 71   Temp 97.9 F (36.6 C) (Oral)   Resp 16   Ht 5' 7"  (1.702 m)   Wt 186 lb (84.4 kg)   SpO2 97%   BMI 29.13 kg/m   Visual Acuity Right Eye Distance:   Left Eye Distance:   Bilateral Distance:    Right Eye Near:   Left Eye Near:    Bilateral Near:     Physical Exam  Constitutional: He appears well-developed and well-nourished. No distress.  Cardiovascular: Normal rate, regular rhythm and normal heart sounds.  Pulmonary/Chest: Effort normal and breath sounds normal. No stridor. No respiratory distress. He has no wheezes. He has no rales.  Abdominal: Soft. Bowel sounds are normal. He exhibits no distension and no mass. There is no tenderness. There is no rebound and no guarding. No hernia.  Skin: He is not diaphoretic.  Nursing note and vitals reviewed.    UC Treatments / Results  Labs (all labs ordered are listed, but only abnormal results are displayed) Labs Reviewed - No data to display  EKG None  Radiology No results found.  Procedures Procedures (including critical care time)  Medications Ordered in UC Medications - No data to display  Initial Impression / Assessment and Plan / UC Course  I have reviewed the triage vital  signs and the nursing notes.  Pertinent labs & imaging results that were available during my care of the patient were reviewed by me and considered in my medical decision making (see chart for details).      Final Clinical Impressions(s) / UC Diagnoses   Final diagnoses:  Fatigue, unspecified type   Discharge Instructions   None    ED Prescriptions    None     1.  diagnosis reviewed with patient 2. Continue current chronic medications 3. Increase fluids  4. Follow-up with PCP  Controlled Substance Prescriptions [place] Controlled Substance Registry consulted? Not Applicable   Norval Gable, MD 01/04/18 [phone]

## 2018-01-04 NOTE — ED Triage Notes (Signed)
Patient in today with his daughters stating that he "just don't feel good" x today when he started eating lunch. Patient was 12/17/17 for same complaints.

## 2018-01-07 ENCOUNTER — Telehealth: Payer: Self-pay | Admitting: Family Medicine

## 2018-01-07 NOTE — Telephone Encounter (Signed)
Copied from [place] [id number]. Topic: Quick Communication - See Telephone Encounter >> Jan 07, 2018  2:02 PM Nils Flack wrote: CRM for notification. See Telephone encounter for: 01/07/18. Caryl Pina from [facility] called - he is requesting callback regarding 6 min walk test and has some questions.

## 2018-01-07 NOTE — Telephone Encounter (Signed)
Patient's daughter notified that the order has been faxed to [facility], and that if she hasn't heard anything by tomorrow to give [place] a call back.

## 2018-01-07 NOTE — Telephone Encounter (Signed)
Call  

## 2018-01-08 NOTE — Telephone Encounter (Signed)
Pt's daughter [name]) requesting call back.     [phone]

## 2018-01-08 NOTE — Telephone Encounter (Signed)
Patient was transferred to provider for telephone conversation.   

## 2018-01-09 ENCOUNTER — Encounter: Payer: Self-pay | Admitting: Family Medicine

## 2018-01-09 DIAGNOSIS — R0902 Hypoxemia: Secondary | ICD-10-CM | POA: Diagnosis not present

## 2018-01-09 DIAGNOSIS — I501 Left ventricular failure: Secondary | ICD-10-CM | POA: Diagnosis not present

## 2018-01-09 DIAGNOSIS — I2510 Atherosclerotic heart disease of native coronary artery without angina pectoris: Secondary | ICD-10-CM | POA: Diagnosis not present

## 2018-01-09 DIAGNOSIS — I509 Heart failure, unspecified: Secondary | ICD-10-CM | POA: Insufficient documentation

## 2018-01-09 NOTE — Telephone Encounter (Signed)
Spoke to [place] w/ [employer] O2 has been approved should be delivered today.

## 2018-01-09 NOTE — Telephone Encounter (Signed)
Spoke with daughter. Advised of difficulties getting approval for insurance coverage of O2. Advised that [name] had spoken directly w/ [employer] twice yesterday and again today so it should be right now. Will call to verify later.

## 2018-01-18 ENCOUNTER — Ambulatory Visit: Payer: Self-pay | Admitting: Family Medicine

## 2018-01-18 NOTE — Telephone Encounter (Signed)
Out going call to patient daughter Todd [name] who is on [place].  Daughter stated that initially they decreased the dose to 3 times a week.  Then completley stopped.  Patients daughter want a decreased dose of Fe called in.  Recommended that they don't completely stop the Fe supplement.  Recommended   increase water intake along with oatmeal, collard greens kale spinach,beef liver and prune juice.  Daughter voiced understanding.  States she will start the Fe supplement again.  States that the patient has a scheduled appointment for 8/22/19States patient does strain with BM.  BM are hard.  Only Fe he gets is through multi vitamins.  Reiterated the importance incorporating greens in diet, pushing fluids, oatmeal.  Keeping appointment for the 22nd.  Call back if symptoms worsen. Daughter voiced understanding.  Reason for Disposition . [1] Uses laxative (e.g., PEG / Miralax. Milk of magnesia) or enema AND [2] > once a month  Answer Assessment - Initial Assessment Questions 1. STOOL PATTERN OR FREQUENCY: "How often do you pass bowel movements (BMs)?"  (Normal range: tid to q 3 days)  "When was the last BM passed?"      unknown 2. STRAINING: "Do you have to strain to have a BM?"      yes 3. RECTAL PAIN: "Does your rectum hurt when the stool comes out?" If so, ask: "Do you have hemorrhoids? How bad is the pain?"  (Scale 1-10; or mild, moderate, severe)    Unknown                                                                                                         4. STOOL COMPOSITION: "Are the stools hard?"      hard 5. BLOOD ON STOOLS: "Has there been any blood on the toilet tissue or on the surface of the BM?" If so, ask: "When was the last time?"      **unknown6. CHRONIC CONSTIPATION: "Is this a new problem for you?"  If no, ask: "How long have you had this problem?" (days, weeks, months)      yes 7. CHANGES IN DIET: "Have there been any recent changes in your diet?"      *No Answer* 8. MEDICATIONS:  "Have you been taking any new medications?"    Multivitamins  9. LAXATIVES: "Have you been using any laxatives or enemas?"  If yes, ask "What, how often, and when was the last time?"     *No Answer* 10. CAUSE: "What do you think is causing the constipation?"        *No Answer* 11. OTHER SYMPTOMS: "Do you have any other symptoms?" (e.g., abdominal pain, fever, vomiting)       *No Answer* 12. PREGNANCY: "Is there any chance you are pregnant?" "When was your last menstrual period?"       *No Answer*  Protocols used: CONSTIPATION-A-AH

## 2018-01-24 ENCOUNTER — Ambulatory Visit: Payer: Self-pay | Admitting: Family Medicine

## 2018-01-24 ENCOUNTER — Ambulatory Visit (INDEPENDENT_AMBULATORY_CARE_PROVIDER_SITE_OTHER): Payer: PPO | Admitting: Family Medicine

## 2018-01-24 ENCOUNTER — Encounter: Payer: Self-pay | Admitting: Family Medicine

## 2018-01-24 VITALS — BP 121/71 | HR 70 | Temp 97.6°F

## 2018-01-24 DIAGNOSIS — R0902 Hypoxemia: Secondary | ICD-10-CM | POA: Diagnosis not present

## 2018-01-24 DIAGNOSIS — R4701 Aphasia: Secondary | ICD-10-CM

## 2018-01-24 DIAGNOSIS — I6930 Unspecified sequelae of cerebral infarction: Secondary | ICD-10-CM | POA: Diagnosis not present

## 2018-01-24 DIAGNOSIS — Z862 Personal history of diseases of the blood and blood-forming organs and certain disorders involving the immune mechanism: Secondary | ICD-10-CM | POA: Diagnosis not present

## 2018-01-24 NOTE — Progress Notes (Signed)
BP 121/71 (BP Location: Left Arm, Patient Position: Sitting, Cuff Size: Normal)   Pulse 70   Temp 97.6 F (36.4 C) (Oral)   SpO2 96%    Subjective:    Patient ID: Todd [name], male    DOB: 1931/01/02, 84 y.o.   MRN: [id number]  HPI: Todd [name] is a 84 y.o. male  Chief Complaint  Patient presents with  . Labs    Care givers want Iron checked and to be on an Iron supplement  . Referral    Referral to speech therapy   Here today with his daughters, who provide all hx as he is aphasic from past CVA.   Wanting his iron checked for his anemia. Takes an iron supplement OTC.   Recently received continuous O2 for hypoxia. Daughters state he doesn't like it at all and wants to come off. Denies any SOB at rest, some DOE. No wheezing. They are working on getting an oximeter to monitor his saturations at home.   Has done speech therapy in the past without relief of his aphasia s/p CVA. Wanting to go again. He states he is determined to get better and be able to communicate well again. Very frustrated by his inability to easily communicate at this point.   Past Medical History:  Diagnosis Date  . Anxiety   . Aortic regurgitation    a. 03/2017 Echo: Mod AI; b. 05/2017 Echo: Triv AI.  [name] Kitchen Arthritis    osteoarthritis-left knee  . CAD (coronary artery disease)    a. Reported h/o cath ~ 2008 - ? small vessel dzs. No PCI performed; b. 03/2017 MV: EF 45-54%, no ischemia/infarct.  . Depression   . Diabetes mellitus without complication ([place])   . Diastolic dysfunction    a. 03/2017 Echo: EF 55-65%; b. 05/2017 Echo: EF 55-60%, no rwma, Gr1 DD. Triv AI.  Mildly dil LA.  . ED (erectile dysfunction)   . Elevated troponin    a. chronic mild elevation - 0.03->0.04.  [name] Kitchen GERD (gastroesophageal reflux disease)   . Glaucoma   . Hyperlipidemia   . Hypertension   . Iron deficiency anemia due to chronic blood loss   . Left pontine stroke w/ cerebrovascular disease(HCC)    a. 05/2017 MRI/A: acute to  subacute L pontine infarct. Chronic left thalamic lacunar infarct. Severe basilar artery stenosis w/ radiographic string sign of the distal 1/3. Mild to moderate L PCA P2 segment stenosis; b. 05/2017 Carotid U/S: <50% bilat ICA stenosis.  . Lumbago    Lumbosacral Neuritis  . Prostate cancer [facility])    Social History   Socioeconomic History  . Marital status: Widowed    Spouse name: Not on file  . Number of children: Not on file  . Years of education: Not on file  . Highest education level: Not on file  Occupational History  . Occupation: retired  Scientific laboratory technician  . Financial resource strain: Not hard at all  . Food insecurity:    Worry: Never true    Inability: Never true  . Transportation needs:    Medical: No    Non-medical: No  Tobacco Use  . Smoking status: Former Smoker    Last attempt to quit: 12/02/1994    Years since quitting: 23.1  . Smokeless tobacco: Never Used  Substance and Sexual Activity  . Alcohol use: Yes    Alcohol/week: 1.0 standard drinks    Types: 1 Cans of beer per week  . Drug use: No  . Sexual activity:  Never  Lifestyle  . Physical activity:    Days per week: 0 days    Minutes per session: 0 min  . Stress: Not at all  Relationships  . Social connections:    Talks on phone: Not on file    Gets together: Not on file    Attends religious service: Not on file    Active member of club or organization: Not on file    Attends meetings of clubs or organizations: Not on file    Relationship status: Widowed  . Intimate partner violence:    Fear of current or ex partner: No    Emotionally abused: No    Physically abused: No    Forced sexual activity: No  Other Topics Concern  . Not on file  Social History Narrative  . Not on file    Relevant past medical, surgical, family and social history reviewed and updated as indicated. Interim medical history since our last visit reviewed. Allergies and medications reviewed and updated.  Review of  Systems  Per HPI unless specifically indicated above     Objective:    BP 121/71 (BP Location: Left Arm, Patient Position: Sitting, Cuff Size: Normal)   Pulse 70   Temp 97.6 F (36.4 C) (Oral)   SpO2 96%   Wt Readings from Last 3 Encounters:  01/04/18 186 lb (84.4 kg)  12/20/17 186 lb (84.4 kg)  12/17/17 186 lb (84.4 kg)    Physical Exam  Constitutional: He appears well-developed and well-nourished. No distress.  HENT:  Head: Atraumatic.  Eyes: Conjunctivae and EOM are normal.  Neck: Normal range of motion. Neck supple.  Cardiovascular: Normal rate.  Pulmonary/Chest: Effort normal. No respiratory distress. He has no wheezes.  Musculoskeletal:  Ambulates slowly and with a walker  Neurological: He is alert.  Skin: Skin is warm and dry.  Psychiatric:  Tries to speak several times, gets visibly frustrated each time   Nursing note and vitals reviewed.   Results for orders placed or performed in visit on 01/14/18  HM DIABETES EYE EXAM  Result Value Ref Range   HM Diabetic Eye Exam No Retinopathy No Retinopathy      Assessment & Plan:   Problem List Items Addressed This Visit      Respiratory   Hypoxia    Currently on 2 L nasal cannula continuously. Tolerating fairly well, able to ambulate comfortably. On walk test, saturations are labile likely secondary to his cold hands and movement. Readings ranged from mid 80s-mid 90s. Breathing comfortably during testing as well as at rest. Continue current O2 regimen        Other   Aphasia    Referral to speech therapy placed      Relevant Orders   Ambulatory referral to Speech Therapy   History of stroke with residual deficit    Referral to speech therapy placed       Other Visit Diagnoses    History of anemia    -  Primary   Labs reviewed, Hgs fairly stable historically between 10-12, normocytic. Family requesting iron studies and further guidance on tx, await results   Relevant Orders   CBC with  Differential/Platelet   Ferritin   Iron Binding Cap (TIBC)([facility])       Follow up plan: Return for as scheduled.

## 2018-01-25 LAB — CBC WITH DIFFERENTIAL/PLATELET
Basophils Absolute: 0 10*3/uL (ref 0.0–0.2)
Basos: 1 %
EOS (ABSOLUTE): 0.2 10*3/uL (ref 0.0–0.4)
Eos: 2 %
Hematocrit: 33 % — ABNORMAL LOW (ref 37.5–51.0)
Hemoglobin: 10.3 g/dL — ABNORMAL LOW (ref 13.0–17.7)
Immature Grans (Abs): 0.1 10*3/uL (ref 0.0–0.1)
Immature Granulocytes: 1 %
Lymphocytes Absolute: 1.6 10*3/uL (ref 0.7–3.1)
Lymphs: 25 %
MCH: 30.5 pg (ref 26.6–33.0)
MCHC: 31.2 g/dL — ABNORMAL LOW (ref 31.5–35.7)
MCV: 98 fL — ABNORMAL HIGH (ref 79–97)
Monocytes Absolute: 0.7 10*3/uL (ref 0.1–0.9)
Monocytes: 11 %
Neutrophils Absolute: 4 10*3/uL (ref 1.4–7.0)
Neutrophils: 60 %
Platelets: 280 10*3/uL (ref 150–450)
RBC: 3.38 x10E6/uL — ABNORMAL LOW (ref 4.14–5.80)
RDW: 13.1 % (ref 12.3–15.4)
WBC: 6.5 10*3/uL (ref 3.4–10.8)

## 2018-01-25 LAB — FERRITIN: Ferritin: 305 ng/mL (ref 30–400)

## 2018-01-25 LAB — IRON AND TIBC
Iron Saturation: 41 % (ref 15–55)
Iron: 96 ug/dL (ref 38–169)
Total Iron Binding Capacity: 234 ug/dL — ABNORMAL LOW (ref 250–450)
UIBC: 138 ug/dL (ref 111–343)

## 2018-01-25 NOTE — Assessment & Plan Note (Signed)
Currently on 2 L nasal cannula continuously. Tolerating fairly well, able to ambulate comfortably. On walk test, saturations are labile likely secondary to his cold hands and movement. Readings ranged from mid 80s-mid 90s. Breathing comfortably during testing as well as at rest. Continue current O2 regimen

## 2018-01-25 NOTE — Assessment & Plan Note (Signed)
Referral to speech therapy placed.

## 2018-01-25 NOTE — Patient Instructions (Signed)
Follow up as scheduled.  

## 2018-02-09 DIAGNOSIS — R0902 Hypoxemia: Secondary | ICD-10-CM | POA: Diagnosis not present

## 2018-02-09 DIAGNOSIS — I501 Left ventricular failure: Secondary | ICD-10-CM | POA: Diagnosis not present

## 2018-02-09 DIAGNOSIS — I2510 Atherosclerotic heart disease of native coronary artery without angina pectoris: Secondary | ICD-10-CM | POA: Diagnosis not present

## 2018-02-11 ENCOUNTER — Ambulatory Visit: Payer: PPO | Attending: Family Medicine | Admitting: Speech Pathology

## 2018-02-11 ENCOUNTER — Encounter: Payer: Self-pay | Admitting: Speech Pathology

## 2018-02-11 DIAGNOSIS — R482 Apraxia: Secondary | ICD-10-CM | POA: Diagnosis not present

## 2018-02-11 DIAGNOSIS — R4701 Aphasia: Secondary | ICD-10-CM | POA: Diagnosis not present

## 2018-02-11 NOTE — Therapy (Signed)
[place] MAIN [facility] SERVICES [address] [place], [place], [address] Phone: [phone]   Fax:  [phone]  Speech Language Pathology Evaluation  Patient Details  Name: Todd [name] MRN: [id number] Date of Birth: 1931/03/31 Referring Provider: Merrie Roof, PA   Encounter Date: 02/11/2018  End of Session - 02/11/18 1744    Visit Number  1    Number of Visits  17    Date for SLP Re-Evaluation  04/08/18    SLP Start Time  25    SLP Stop Time   1658    SLP Time Calculation (min)  58 min    Activity Tolerance  Patient tolerated treatment well       Past Medical History:  Diagnosis Date  . Anxiety   . Aortic regurgitation    a. 03/2017 Echo: Mod AI; b. 05/2017 Echo: Triv AI.  [name] Kitchen Arthritis    osteoarthritis-left knee  . CAD (coronary artery disease)    a. Reported h/o cath ~ 2008 - ? small vessel dzs. No PCI performed; b. 03/2017 MV: EF 45-54%, no ischemia/infarct.  . Depression   . Diabetes mellitus without complication ([place])   . Diastolic dysfunction    a. 03/2017 Echo: EF 55-65%; b. 05/2017 Echo: EF 55-60%, no rwma, Gr1 DD. Triv AI.  Mildly dil LA.  . ED (erectile dysfunction)   . Elevated troponin    a. chronic mild elevation - 0.03->0.04.  [name] Kitchen GERD (gastroesophageal reflux disease)   . Glaucoma   . Hyperlipidemia   . Hypertension   . Iron deficiency anemia due to chronic blood loss   . Left pontine stroke w/ cerebrovascular disease(HCC)    a. 05/2017 MRI/A: acute to subacute L pontine infarct. Chronic left thalamic lacunar infarct. Severe basilar artery stenosis w/ radiographic string sign of the distal 1/3. Mild to moderate L PCA P2 segment stenosis; b. 05/2017 Carotid U/S: <50% bilat ICA stenosis.  . Lumbago    Lumbosacral Neuritis  . Prostate cancer [facility])     Past Surgical History:  Procedure Laterality Date  . CARPAL TUNNEL RELEASE    . REPLACEMENT TOTAL KNEE BILATERAL      There were no vitals filed for this  visit.  Subjective Assessment - 02/11/18 1709    Subjective  Patient is easily frustrated with his difficulties expressing himself. Daughter present and supportive    Patient is accompained by:  Family member    Currently in Pain?  No/denies         SLP Evaluation [facility] - 02/11/18 1709      SLP Visit Information   SLP Received On  02/11/18    Referring Provider  Merrie Roof, PA    Onset Date  05/17/2017      Subjective   Patient/Family Stated Goal  Maximize communication and speech production      General Information   HPI  84 year old man s/p left pontine CVA 05/17/2017. Patient received rehab including speech language therapy at a SNF from 12/17-1/18/2019, home health from 1/18-08/09/2017, and outpatient SLP tx here at [facility] from  3/27-5/29/19. Past medical history includes vascular dementia.    Behavioral/Cognition  --      Prior Functional Status   Cognitive/Linguistic Baseline  Baseline deficits      Auditory Comprehension   Overall Auditory Comprehension  Impaired at baseline    Yes/No Questions  Impaired    Complex Questions  75-100% accurate    Commands  Impaired    One  Step Basic Commands  75-100% accurate    Two Step Basic Commands  0-24% accurate      Expression   Primary Mode of Expression  Verbal      Verbal Expression   Initiation  Impaired    Repetition  Impaired    Level of Impairment  Word level      Written Expression   Dominant Hand  Left    Written Expression  Unable to assess (comment)    Overall Writen Expression  CVA affected dominant hand, unable to write      Motor Speech   Overall Motor Speech  Impaired at baseline    Respiration  Within functional limits    Phonation  Normal    Resonance  Within functional limits    Articulation  Impaired    Level of Impairment  Word    Intelligibility  Intelligibility reduced    Motor Planning  Impaired    Level of Impairment  Word    Motor Speech Errors  Aware;Groping for words;Inconsistent     Effective Techniques  Slow rate    Phonation  WFL      Standardized Assessments   Standardized Assessments   Western Aphasia Battery revised      Western Aphasia Battery- Revised  Spontaneous Speech      Information content  3/10       Fluency   2/10     Comprehension     Yes/No questions  51/60        Auditory Word Recognition 51/60        Sequential Commands 30/80--unable to complete 2 step    Repetition   26/100     Naming    Object Naming  42/60        Word Fluency     0/20        Sentence Completion   4/10        Responsive Speech   4/10     Aphasia Quotient  38.4/100   Reading and Writing    Reading   Not fully assessed due to time constraints, to be further assessed in treatment      Writing   CNT         SLP Education - 02/11/18 1742    Education provided  Yes    Education Details  role of SLP in speech and language treatment, compensatory strategies to faciliate verbal expression, discussion re: use of augmentative communication to aid expression and faciliate functional independence with communicating wants and needs.    Person(s) Educated  Patient;Child(ren)    Methods  Explanation;Demonstration;Verbal cues    Comprehension  Verbalized understanding;Returned demonstration;Verbal cues required;Need further instruction         SLP Long Term Goals - 02/11/18 1751      SLP LONG TERM GOAL #1   Title  Patient will read aloud or repeat words/phrases/sentences, maintaining phonemic accuracy, with 80% accuracy/fluency.      Time  8    Period  Weeks    Status  New    Target Date  04/08/18      SLP LONG TERM GOAL #2   Title  Patient will name objects, pictures, people, and/or activities verbally with 80% accuracy given min verbal, visual or tactile cues.    Time  8    Period  Weeks    Status  New    Target Date  04/08/18      SLP LONG TERM GOAL #3  Title  Patient will complete simple phrases/sentences with 80% accuracy given min verbal, visual or tactile  cues.    Time  8    Period  Weeks    Status  New    Target Date  04/08/18      SLP LONG TERM GOAL #4   Title  Patient will use AAC to supplement verbal expression to communicate with familiar listeners with 80% intelligibility given min cues.    Time  8    Period  Weeks    Status  New    Target Date  04/08/18       Plan - 02/11/18 1745    Clinical Impression Statement  At 8 1/2 months s/p onset of left pontine CVA, patient presents with moderate aphasia and severe acquired apraxia of speech c/b marked groping articulatory movments, phonemic omissions/substitutions, inconsistent errors, reduced fluency and informational content with production of any spontaneous speech attempts, reduced auditory comprehension for 2 step commands and complex verbal information, verbal  perseveration, as well as perseveration attempting to follow commands. Severity of apraxia and aphasia appear to have increased since last SLP evaluation in 08/2017. Break in previous SLP treatment due to several ER visits since end of 10/2017 for complaints of fatigue and "not feeling well."    Speech Therapy Frequency  2x / week    Duration  Other (comment)   8 weeks   Treatment/Interventions  Language facilitation;Cueing hierarchy;SLP instruction and feedback;Multimodal communcation approach;Patient/family education    Potential to Achieve Goals  Fair    Potential Considerations  Ability to learn/carryover information;Pain level;Family/community support;Co-morbidities;Previous level of function;Cooperation/participation level;Severity of impairments;Medical prognosis    SLP Home Exercise Plan  To be determined    Consulted and Agree with Plan of Care  Patient;Family member/caregiver       Patient will benefit from skilled therapeutic intervention in order to improve the following deficits and impairments:   Apraxia  Aphasia    Problem List Patient Active Problem List   Diagnosis Date Noted  . CHF (congestive heart  failure) ([place]) 01/09/2018  . Iron deficiency anemia due to chronic blood loss 12/20/2017  . Advanced care planning/counseling discussion 12/20/2017  . Hypoxia 11/02/2017  . Mixed hyperlipidemia 09/21/2017  . Aphasia 05/18/2017  . CAD (coronary artery disease) 05/18/2017  . History of stroke with residual deficit 05/18/2017  . 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 Knee joint replaced by other means 07/04/2015  . Urinary incontinence 04/27/2015  . Anxiety, generalized 04/07/2015  . History of BCG vaccination 04/07/2015  . GERD (gastroesophageal reflux disease) 03/04/2015  . Depression 02/25/2015  . Degeneration of intervertebral disc of lumbar region 02/23/2015  . Neuritis or radiculitis due to rupture of lumbar intervertebral disc 02/23/2015  . Lumbar stenosis with neurogenic claudication 02/23/2015  . Hypertension 12/02/2014  . Diabetes mellitus without complication ([place]) 16/12/3708  . Benign essential tremor 03/13/2014  . Imbalance 03/13/2014  . Malignant neoplasm of prostate ([place]) 10/07/2012  . ED (erectile dysfunction) of organic origin 02/09/2012    Thelmer Legler, MA, CCC-SLP 02/11/2018, 6:07 PM  [place] MAIN [facility] SERVICES [address] [place], [place], [address] Phone: [phone]   Fax:  [phone]  Name: Todd [name] MRN: [id number] Date of Birth: 1930/07/15

## 2018-02-14 ENCOUNTER — Ambulatory Visit: Payer: PPO | Admitting: Speech Pathology

## 2018-02-14 DIAGNOSIS — R482 Apraxia: Secondary | ICD-10-CM | POA: Diagnosis not present

## 2018-02-14 DIAGNOSIS — R4701 Aphasia: Secondary | ICD-10-CM

## 2018-02-15 ENCOUNTER — Encounter: Payer: Self-pay | Admitting: Speech Pathology

## 2018-02-15 NOTE — Therapy (Signed)
[place] MAIN [facility] SERVICES [address] [place], [place], [address] Phone: [phone]   Fax:  [phone]  Speech Language Pathology Treatment  Patient Details  Name: Todd [name] MRN: [id number] Date of Birth: 1930/08/13 Referring Provider: Merrie Roof, PA   Encounter Date: 02/14/2018  End of Session - 02/15/18 1416    Visit Number  2    Number of Visits  17    Date for SLP Re-Evaluation  04/08/18    SLP Start Time  1600    SLP Stop Time   1650    SLP Time Calculation (min)  50 min    Activity Tolerance  Patient tolerated treatment well       Past Medical History:  Diagnosis Date  . Anxiety   . Aortic regurgitation    a. 03/2017 Echo: Mod AI; b. 05/2017 Echo: Triv AI.  [name] Kitchen Arthritis    osteoarthritis-left knee  . CAD (coronary artery disease)    a. Reported h/o cath ~ 2008 - ? small vessel dzs. No PCI performed; b. 03/2017 MV: EF 45-54%, no ischemia/infarct.  . Depression   . Diabetes mellitus without complication ([place])   . Diastolic dysfunction    a. 03/2017 Echo: EF 55-65%; b. 05/2017 Echo: EF 55-60%, no rwma, Gr1 DD. Triv AI.  Mildly dil LA.  . ED (erectile dysfunction)   . Elevated troponin    a. chronic mild elevation - 0.03->0.04.  [name] Kitchen GERD (gastroesophageal reflux disease)   . Glaucoma   . Hyperlipidemia   . Hypertension   . Iron deficiency anemia due to chronic blood loss   . Left pontine stroke w/ cerebrovascular disease(HCC)    a. 05/2017 MRI/A: acute to subacute L pontine infarct. Chronic left thalamic lacunar infarct. Severe basilar artery stenosis w/ radiographic string sign of the distal 1/3. Mild to moderate L PCA P2 segment stenosis; b. 05/2017 Carotid U/S: <50% bilat ICA stenosis.  . Lumbago    Lumbosacral Neuritis  . Prostate cancer [facility])     Past Surgical History:  Procedure Laterality Date  . CARPAL TUNNEL RELEASE    . REPLACEMENT TOTAL KNEE BILATERAL      There were no vitals filed for this  visit.  Subjective Assessment - 02/15/18 1413    Subjective  Patient appeared excited to share his picture books that he brought with him, smiling often and sharing his books.    Currently in Pain?  No/denies            ADULT SLP TREATMENT - 02/15/18 0001      General Information   Behavior/Cognition  Alert;Cooperative;Pleasant mood;Impulsive    Patient Positioning  Upright in chair    HPI   84 year old man S/P left pontine stroke 05/17/2017.  The patient received rehab services, including speech therapy, at a SNF (05/21/2017 - 06/22/2017) and via home health (06/22/2017 - 08/09/2017).  Past medical history includes vascular dementia.       Treatment Provided   Treatment provided  Cognitive-Linquistic      Pain Assessment   Pain Assessment  No/denies pain      Cognitive-Linquistic Treatment   Treatment focused on  Aphasia;Apraxia    Skilled Treatment  Receptive/Expressive Language Treatment: Patient repeated names of pictured objects/people/actions with 70% accuracy given regular verbal and visual cues. Patient required frequent direction to "look at SLP" to benefit from visual cues. Patient often looks away and towards ceiling when he's concentrating, and becomes frustrated when he's unsuccessful. With consistent  verbal cues to look at SLP, visual cues are largely successful in helping patient produce accurate or approximate productions of pictured target items.  Patient identified pictured objects/people/actions in a FO:16 with 100% accuracy independently.     Assessment / Recommendations / Plan   Plan  Continue with current plan of care      Progression Toward Goals   Progression toward goals  Progressing toward goals       SLP Education - 02/15/18 1415    Education provided  Yes    Education Details  educated patient re: strategies to facilitate verbal expression    Person(s) Educated  Patient    Methods  Explanation;Demonstration;Tactile cues;Verbal cues     Comprehension  Returned demonstration;Verbal cues required;Need further instruction         SLP Long Term Goals - 02/11/18 1751      SLP LONG TERM GOAL #1   Title  Patient will read aloud or repeat words/phrases/sentences, maintaining phonemic accuracy, with 80% accuracy/fluency.      Time  8    Period  Weeks    Status  New    Target Date  04/08/18      SLP LONG TERM GOAL #2   Title  Patient will name objects, pictures, people, and/or activities verbally with 80% accuracy given min verbal, visual or tactile cues.    Time  8    Period  Weeks    Status  New    Target Date  04/08/18      SLP LONG TERM GOAL #3   Title  Patient will complete simple phrases/sentences with 80% accuracy given min verbal, visual or tactile cues.    Time  8    Period  Weeks    Status  New    Target Date  04/08/18      SLP LONG TERM GOAL #4   Title  Patient will use AAC to supplement verbal expression to communicate with familiar listeners with 80% intelligibility given min cues.    Time  8    Period  Weeks    Status  New    Target Date  04/08/18       Plan - 02/15/18 1416    Clinical Impression Statement  Patient demonstrated improved repetition of single words given consistent visual cues for mouth placement and pacing. Patient demonstrated consistent accuracy identifying pictured objects in FO:16 independently. Further investigation and development of  re: possible aug comm method to be completed in following treatment sessions.    Speech Therapy Frequency  2x / week    Duration  Other (comment)   8 weeks   Treatment/Interventions  Language facilitation;Cueing hierarchy;SLP instruction and feedback;Multimodal communcation approach;Patient/family education    Potential to Achieve Goals  Fair    Potential Considerations  Ability to learn/carryover information;Pain level;Family/community support;Co-morbidities;Previous level of function;Cooperation/participation level;Severity of impairments;Medical  prognosis    SLP Home Exercise Plan  To be determined    Consulted and Agree with Plan of Care  Patient       Patient will benefit from skilled therapeutic intervention in order to improve the following deficits and impairments:   Apraxia  Aphasia    Problem List Patient Active Problem List   Diagnosis Date Noted  . CHF (congestive heart failure) ([place]) 01/09/2018  . Iron deficiency anemia due to chronic blood loss 12/20/2017  . Advanced care planning/counseling discussion 12/20/2017  . Hypoxia 11/02/2017  . Mixed hyperlipidemia 09/21/2017  . Aphasia 05/18/2017  . CAD (coronary  artery disease) 05/18/2017  . History of stroke with residual deficit 05/18/2017  . 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 Knee joint replaced by other means 07/04/2015  . Urinary incontinence 04/27/2015  . Anxiety, generalized 04/07/2015  . History of BCG vaccination 04/07/2015  . GERD (gastroesophageal reflux disease) 03/04/2015  . Depression 02/25/2015  . Degeneration of intervertebral disc of lumbar region 02/23/2015  . Neuritis or radiculitis due to rupture of lumbar intervertebral disc 02/23/2015  . Lumbar stenosis with neurogenic claudication 02/23/2015  . Hypertension 12/02/2014  . Diabetes mellitus without complication ([place]) 90/24/0973  . Benign essential tremor 03/13/2014  . Imbalance 03/13/2014  . Malignant neoplasm of prostate ([place]) 10/07/2012  . ED (erectile dysfunction) of organic origin 02/09/2012    Tamryn Popko, MA, CCC-SLP 02/15/2018, 2:20 PM  [place] MAIN [facility] SERVICES [address] [place], [place], [address] Phone: [phone]   Fax:  [phone]   Name: Keean Wilmeth MRN: [id number] Date of Birth: [birth year]

## 2018-02-18 ENCOUNTER — Encounter: Payer: Self-pay | Admitting: Speech Pathology

## 2018-02-18 ENCOUNTER — Ambulatory Visit: Payer: PPO | Admitting: Speech Pathology

## 2018-02-18 DIAGNOSIS — R482 Apraxia: Secondary | ICD-10-CM | POA: Diagnosis not present

## 2018-02-18 DIAGNOSIS — R4701 Aphasia: Secondary | ICD-10-CM

## 2018-02-19 ENCOUNTER — Ambulatory Visit: Payer: PPO | Admitting: Speech Pathology

## 2018-02-21 ENCOUNTER — Encounter: Payer: PPO | Admitting: Speech Pathology

## 2018-02-21 ENCOUNTER — Encounter: Payer: Self-pay | Admitting: Speech Pathology

## 2018-02-21 ENCOUNTER — Ambulatory Visit: Payer: PPO | Admitting: Speech Pathology

## 2018-02-21 DIAGNOSIS — R482 Apraxia: Secondary | ICD-10-CM

## 2018-02-21 DIAGNOSIS — R4701 Aphasia: Secondary | ICD-10-CM

## 2018-02-21 NOTE — Therapy (Deleted)
[place] MAIN [facility] SERVICES [place], [place], [address] Phone: [phone]   Fax:  [phone]  Speech Language Pathology Treatment  Patient Details  Name: Todd [name] MRN: [id number] Date of Birth: 05/27/1931 Referring Provider: Merrie Roof, PA   Encounter Date: 02/18/2018    Past Medical History:  Diagnosis Date  . Anxiety   . Aortic regurgitation    a. 03/2017 Echo: Mod AI; b. 05/2017 Echo: Triv AI.  [name] Kitchen Arthritis    osteoarthritis-left knee  . CAD (coronary artery disease)    a. Reported h/o cath ~ 2008 - ? small vessel dzs. No PCI performed; b. 03/2017 MV: EF 45-54%, no ischemia/infarct.  . Depression   . Diabetes mellitus without complication ([place])   . Diastolic dysfunction    a. 03/2017 Echo: EF 55-65%; b. 05/2017 Echo: EF 55-60%, no rwma, Gr1 DD. Triv AI.  Mildly dil LA.  . ED (erectile dysfunction)   . Elevated troponin    a. chronic mild elevation - 0.03->0.04.  [name] Kitchen GERD (gastroesophageal reflux disease)   . Glaucoma   . Hyperlipidemia   . Hypertension   . Iron deficiency anemia due to chronic blood loss   . Left pontine stroke w/ cerebrovascular disease(HCC)    a. 05/2017 MRI/A: acute to subacute L pontine infarct. Chronic left thalamic lacunar infarct. Severe basilar artery stenosis w/ radiographic string sign of the distal 1/3. Mild to moderate L PCA P2 segment stenosis; b. 05/2017 Carotid U/S: <50% bilat ICA stenosis.  . Lumbago    Lumbosacral Neuritis  . Prostate cancer [facility])     Past Surgical History:  Procedure Laterality Date  . CARPAL TUNNEL RELEASE    . REPLACEMENT TOTAL KNEE BILATERAL      There were no vitals filed for this visit.               SLP Long Term Goals - 02/11/18 1751      SLP LONG TERM GOAL #1   Title  Patient will read aloud or repeat words/phrases/sentences, maintaining phonemic accuracy, with 80% accuracy/fluency.      Time  8    Period  Weeks    Status  New     Target Date  04/08/18      SLP LONG TERM GOAL #2   Title  Patient will name objects, pictures, people, and/or activities verbally with 80% accuracy given min verbal, visual or tactile cues.    Time  8    Period  Weeks    Status  New    Target Date  04/08/18      SLP LONG TERM GOAL #3   Title  Patient will complete simple phrases/sentences with 80% accuracy given min verbal, visual or tactile cues.    Time  8    Period  Weeks    Status  New    Target Date  04/08/18      SLP LONG TERM GOAL #4   Title  Patient will use AAC to supplement verbal expression to communicate with familiar listeners with 80% intelligibility given min cues.    Time  8    Period  Weeks    Status  New    Target Date  04/08/18         Patient will benefit from skilled therapeutic intervention in order to improve the following deficits and impairments:   Apraxia  Aphasia    Problem List Patient Active Problem List   Diagnosis Date Noted  .  CHF (congestive heart failure) ([place]) 01/09/2018  . Iron deficiency anemia due to chronic blood loss 12/20/2017  . Advanced care planning/counseling discussion 12/20/2017  . Hypoxia 11/02/2017  . Mixed hyperlipidemia 09/21/2017  . Aphasia 05/18/2017  . CAD (coronary artery disease) 05/18/2017  . History of stroke with residual deficit 05/18/2017  . 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 Knee joint replaced by other means 07/04/2015  . Urinary incontinence 04/27/2015  . Anxiety, generalized 04/07/2015  . History of BCG vaccination 04/07/2015  . GERD (gastroesophageal reflux disease) 03/04/2015  . Depression 02/25/2015  . Degeneration of intervertebral disc of lumbar region 02/23/2015  . Neuritis or radiculitis due to rupture of lumbar intervertebral disc 02/23/2015  . Lumbar stenosis with neurogenic claudication 02/23/2015  . Hypertension 12/02/2014  . Diabetes mellitus without complication ([place]) 67/70/3403  . Benign essential tremor 03/13/2014   . Imbalance 03/13/2014  . Malignant neoplasm of prostate ([place]) 10/07/2012  . ED (erectile dysfunction) of organic origin 02/09/2012    Amond Speranza 02/21/2018, 8:12 AM  [place] MAIN [facility] SERVICES [place], [place], [address] Phone: [phone]   Fax:  [phone]   Name: Jorge Retz MRN: [id number] Date of Birth: [birth year]

## 2018-02-21 NOTE — Therapy (Signed)
[place] MAIN [facility] SERVICES [address] [place], [place], [address] Phone: [phone]   Fax:  [phone]  Speech Language Pathology Treatment  Patient Details  Name: Todd [name] MRN: [id number] Date of Birth: June 09, 1930 Referring Provider: Merrie Roof, PA   Encounter Date: 02/21/2018  End of Session - 02/21/18 1701    Visit Number  4    Number of Visits  17    Date for SLP Re-Evaluation  04/08/18    SLP Start Time  17    SLP Stop Time   1648    SLP Time Calculation (min)  48 min    Activity Tolerance  Patient tolerated treatment well       Past Medical History:  Diagnosis Date  . Anxiety   . Aortic regurgitation    a. 03/2017 Echo: Mod AI; b. 05/2017 Echo: Triv AI.  [name] Kitchen Arthritis    osteoarthritis-left knee  . CAD (coronary artery disease)    a. Reported h/o cath ~ 2008 - ? small vessel dzs. No PCI performed; b. 03/2017 MV: EF 45-54%, no ischemia/infarct.  . Depression   . Diabetes mellitus without complication ([place])   . Diastolic dysfunction    a. 03/2017 Echo: EF 55-65%; b. 05/2017 Echo: EF 55-60%, no rwma, Gr1 DD. Triv AI.  Mildly dil LA.  . ED (erectile dysfunction)   . Elevated troponin    a. chronic mild elevation - 0.03->0.04.  [name] Kitchen GERD (gastroesophageal reflux disease)   . Glaucoma   . Hyperlipidemia   . Hypertension   . Iron deficiency anemia due to chronic blood loss   . Left pontine stroke w/ cerebrovascular disease(HCC)    a. 05/2017 MRI/A: acute to subacute L pontine infarct. Chronic left thalamic lacunar infarct. Severe basilar artery stenosis w/ radiographic string sign of the distal 1/3. Mild to moderate L PCA P2 segment stenosis; b. 05/2017 Carotid U/S: <50% bilat ICA stenosis.  . Lumbago    Lumbosacral Neuritis  . Prostate cancer [facility])     Past Surgical History:  Procedure Laterality Date  . CARPAL TUNNEL RELEASE    . REPLACEMENT TOTAL KNEE BILATERAL      There were no vitals filed for this  visit.  Subjective Assessment - 02/21/18 1658    Subjective  Patient appeared to enjoy treatment activities, smiling often.    Currently in Pain?  No/denies            ADULT SLP TREATMENT - 02/21/18 1700      General Information   Behavior/Cognition  Alert;Cooperative;Pleasant mood    Patient Positioning  Upright in chair    HPI   84 year old man S/P left pontine stroke 05/17/2017.  The patient received rehab services, including speech therapy, at a SNF (05/21/2017 - 06/22/2017) and via home health (06/22/2017 - 08/09/2017).  Past medical history includes vascular dementia.       Treatment Provided   Treatment provided  Cognitive-Linquistic      Pain Assessment   Pain Assessment  No/denies pain      Cognitive-Linquistic Treatment   Treatment focused on  Aphasia;Apraxia    Skilled Treatment  Patient named pictured objects and actions with 100% intelligibility with 80% accuracy given mod verbal and visual cues and with 52% accuracy independently.  Patient used gestures and pantomime with objects to communicate pictured actions to SLP with 83% accuracy given min to mod verbal and visual cues.  Patient repeated names of pictured objects in his home practice books  with 70% accuracy given regular visual cues.     Assessment / Recommendations / Plan   Plan  Continue with current plan of care      Progression Toward Goals   Progression toward goals  Progressing toward goals       SLP Education - 02/21/18 1701    Education provided  Yes    Education Details  educated patient re: strategies to facilitate and supplement (AAC) verbal expression    Person(s) Educated  Patient    Methods  Explanation;Demonstration;Verbal cues    Comprehension  Need further instruction;Returned demonstration;Verbal cues required         SLP Long Term Goals - 02/11/18 1751      SLP LONG TERM GOAL #1   Title  Patient will read aloud or repeat words/phrases/sentences, maintaining phonemic accuracy,  with 80% accuracy/fluency.      Time  8    Period  Weeks    Status  New    Target Date  04/08/18      SLP LONG TERM GOAL #2   Title  Patient will name objects, pictures, people, and/or activities verbally with 80% accuracy given min verbal, visual or tactile cues.    Time  8    Period  Weeks    Status  New    Target Date  04/08/18      SLP LONG TERM GOAL #3   Title  Patient will complete simple phrases/sentences with 80% accuracy given min verbal, visual or tactile cues.    Time  8    Period  Weeks    Status  New    Target Date  04/08/18      SLP LONG TERM GOAL #4   Title  Patient will use AAC to supplement verbal expression to communicate with familiar listeners with 80% intelligibility given min cues.    Time  8    Period  Weeks    Status  New    Target Date  04/08/18       Plan - 02/21/18 1701    Clinical Impression Statement  Noted marked increase in intelligible spontaneous speech in response to pictured action cards, producing up to 4 word utterances (ie" He's crossing his legs, You asked me for"). Patient appeared to enjoy barrier game, smiling and laughing often when gesturing and pantomiming actions to communicate pictured actions to SLP. Patient demonstrates marked improved success in response to visual cues; however patient appears to prefer doing everything on his own and requires consistent cues to look at SLP for visual cues.    Speech Therapy Frequency  2x / week    Duration  Other (comment)   8 weeks   Treatment/Interventions  Language facilitation;Cueing hierarchy;SLP instruction and feedback;Multimodal communcation approach;Patient/family education    Potential to Achieve Goals  Fair    Potential Considerations  Ability to learn/carryover information;Pain level;Family/community support;Co-morbidities;Previous level of function;Cooperation/participation level;Severity of impairments;Medical prognosis    SLP Home Exercise Plan  To be determined    Consulted and  Agree with Plan of Care  Patient       Patient will benefit from skilled therapeutic intervention in order to improve the following deficits and impairments:   Apraxia  Aphasia    Problem List Patient Active Problem List   Diagnosis Date Noted  . CHF (congestive heart failure) ([place]) 01/09/2018  . Iron deficiency anemia due to chronic blood loss 12/20/2017  . Advanced care planning/counseling discussion 12/20/2017  . Hypoxia 11/02/2017  . Mixed  hyperlipidemia 09/21/2017  . Aphasia 05/18/2017  . CAD (coronary artery disease) 05/18/2017  . History of stroke with residual deficit 05/18/2017  . 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 Knee joint replaced by other means 07/04/2015  . Urinary incontinence 04/27/2015  . Anxiety, generalized 04/07/2015  . History of BCG vaccination 04/07/2015  . GERD (gastroesophageal reflux disease) 03/04/2015  . Depression 02/25/2015  . Degeneration of intervertebral disc of lumbar region 02/23/2015  . Neuritis or radiculitis due to rupture of lumbar intervertebral disc 02/23/2015  . Lumbar stenosis with neurogenic claudication 02/23/2015  . Hypertension 12/02/2014  . Diabetes mellitus without complication ([place]) 56/81/2751  . Benign essential tremor 03/13/2014  . Imbalance 03/13/2014  . Malignant neoplasm of prostate ([place]) 10/07/2012  . ED (erectile dysfunction) of organic origin 02/09/2012    Jarelle Ates, MA, CCC-SLP 02/21/2018, 5:03 PM  [place] MAIN [facility] SERVICES [address] [place], [place], [address] Phone: [phone]   Fax:  [phone]   Name: Todd [name] MRN: [id number] Date of Birth: 1930-08-07

## 2018-02-21 NOTE — Therapy (Deleted)
[place] MAIN [facility] SERVICES [place], [place], [address] Phone: [phone]   Fax:  [phone]  Speech Language Pathology Treatment  Patient Details  Name: Todd [name] MRN: [id number] Date of Birth: August 23, 1930 Referring Provider: Merrie Roof, PA   Encounter Date: 02/18/2018    Past Medical History:  Diagnosis Date  . Anxiety   . Aortic regurgitation    a. 03/2017 Echo: Mod AI; b. 05/2017 Echo: Triv AI.  [name] Kitchen Arthritis    osteoarthritis-left knee  . CAD (coronary artery disease)    a. Reported h/o cath ~ 2008 - ? small vessel dzs. No PCI performed; b. 03/2017 MV: EF 45-54%, no ischemia/infarct.  . Depression   . Diabetes mellitus without complication ([place])   . Diastolic dysfunction    a. 03/2017 Echo: EF 55-65%; b. 05/2017 Echo: EF 55-60%, no rwma, Gr1 DD. Triv AI.  Mildly dil LA.  . ED (erectile dysfunction)   . Elevated troponin    a. chronic mild elevation - 0.03->0.04.  [name] Kitchen GERD (gastroesophageal reflux disease)   . Glaucoma   . Hyperlipidemia   . Hypertension   . Iron deficiency anemia due to chronic blood loss   . Left pontine stroke w/ cerebrovascular disease(HCC)    a. 05/2017 MRI/A: acute to subacute L pontine infarct. Chronic left thalamic lacunar infarct. Severe basilar artery stenosis w/ radiographic string sign of the distal 1/3. Mild to moderate L PCA P2 segment stenosis; b. 05/2017 Carotid U/S: <50% bilat ICA stenosis.  . Lumbago    Lumbosacral Neuritis  . Prostate cancer [facility])     Past Surgical History:  Procedure Laterality Date  . CARPAL TUNNEL RELEASE    . REPLACEMENT TOTAL KNEE BILATERAL      There were no vitals filed for this visit.               SLP Long Term Goals - 02/11/18 1751      SLP LONG TERM GOAL #1   Title  Patient will read aloud or repeat words/phrases/sentences, maintaining phonemic accuracy, with 80% accuracy/fluency.      Time  8    Period  Weeks    Status  New     Target Date  04/08/18      SLP LONG TERM GOAL #2   Title  Patient will name objects, pictures, people, and/or activities verbally with 80% accuracy given min verbal, visual or tactile cues.    Time  8    Period  Weeks    Status  New    Target Date  04/08/18      SLP LONG TERM GOAL #3   Title  Patient will complete simple phrases/sentences with 80% accuracy given min verbal, visual or tactile cues.    Time  8    Period  Weeks    Status  New    Target Date  04/08/18      SLP LONG TERM GOAL #4   Title  Patient will use AAC to supplement verbal expression to communicate with familiar listeners with 80% intelligibility given min cues.    Time  8    Period  Weeks    Status  New    Target Date  04/08/18         Patient will benefit from skilled therapeutic intervention in order to improve the following deficits and impairments:   Apraxia  Aphasia    Problem List Patient Active Problem List   Diagnosis Date Noted  .  CHF (congestive heart failure) ([place]) 01/09/2018  . Iron deficiency anemia due to chronic blood loss 12/20/2017  . Advanced care planning/counseling discussion 12/20/2017  . Hypoxia 11/02/2017  . Mixed hyperlipidemia 09/21/2017  . Aphasia 05/18/2017  . CAD (coronary artery disease) 05/18/2017  . History of stroke with residual deficit 05/18/2017  . 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 Knee joint replaced by other means 07/04/2015  . Urinary incontinence 04/27/2015  . Anxiety, generalized 04/07/2015  . History of BCG vaccination 04/07/2015  . GERD (gastroesophageal reflux disease) 03/04/2015  . Depression 02/25/2015  . Degeneration of intervertebral disc of lumbar region 02/23/2015  . Neuritis or radiculitis due to rupture of lumbar intervertebral disc 02/23/2015  . Lumbar stenosis with neurogenic claudication 02/23/2015  . Hypertension 12/02/2014  . Diabetes mellitus without complication ([place]) 38/03/1750  . Benign essential tremor 03/13/2014   . Imbalance 03/13/2014  . Malignant neoplasm of prostate ([place]) 10/07/2012  . ED (erectile dysfunction) of organic origin 02/09/2012    Todd [name] 02/21/2018, 8:06 AM  [place] MAIN [facility] SERVICES [place], [place], [address] Phone: [phone]   Fax:  [phone]   Name: Todd [name] MRN: [id number] Date of Birth: 1931-03-23

## 2018-02-21 NOTE — Therapy (Signed)
[place] MAIN [facility] SERVICES [address] [place], [place], [address] Phone: [phone]   Fax:  [phone]  Speech Language Pathology Treatment  Patient Details  Name: Todd [name] MRN: [id number] Date of Birth: [birth year] Referring Provider: Merrie Roof, PA   Encounter Date: 02/18/2018  End of Session - 02/21/18 0817    Visit Number  3    Number of Visits  17    Date for SLP Re-Evaluation  04/08/18    SLP Start Time  1555    SLP Stop Time   8299    SLP Time Calculation (min)  50 min    Activity Tolerance  Patient tolerated treatment well       Past Medical History:  Diagnosis Date  . Anxiety   . Aortic regurgitation    a. 03/2017 Echo: Mod AI; b. 05/2017 Echo: Triv AI.  [name] Kitchen Arthritis    osteoarthritis-left knee  . CAD (coronary artery disease)    a. Reported h/o cath ~ 2008 - ? small vessel dzs. No PCI performed; b. 03/2017 MV: EF 45-54%, no ischemia/infarct.  . Depression   . Diabetes mellitus without complication ([place])   . Diastolic dysfunction    a. 03/2017 Echo: EF 55-65%; b. 05/2017 Echo: EF 55-60%, no rwma, Gr1 DD. Triv AI.  Mildly dil LA.  . ED (erectile dysfunction)   . Elevated troponin    a. chronic mild elevation - 0.03->0.04.  [name] Kitchen GERD (gastroesophageal reflux disease)   . Glaucoma   . Hyperlipidemia   . Hypertension   . Iron deficiency anemia due to chronic blood loss   . Left pontine stroke w/ cerebrovascular disease(HCC)    a. 05/2017 MRI/A: acute to subacute L pontine infarct. Chronic left thalamic lacunar infarct. Severe basilar artery stenosis w/ radiographic string sign of the distal 1/3. Mild to moderate L PCA P2 segment stenosis; b. 05/2017 Carotid U/S: <50% bilat ICA stenosis.  . Lumbago    Lumbosacral Neuritis  . Prostate cancer [facility])     Past Surgical History:  Procedure Laterality Date  . CARPAL TUNNEL RELEASE    . REPLACEMENT TOTAL KNEE BILATERAL      There were no vitals filed for this  visit.  Subjective Assessment - 02/21/18 0813    Subjective  Patient smiled when verbally successful, and cried a few times when he became overly frustrated.    Currently in Pain?  No/denies    Pain Score  0-No pain            ADULT SLP TREATMENT - 02/21/18 0001      General Information   Behavior/Cognition  Alert;Cooperative;Pleasant mood;Impulsive    Patient Positioning  Upright in chair    HPI   84 year old man S/P left pontine stroke 05/17/2017.  The patient received rehab services, including speech therapy, at a SNF (05/21/2017 - 06/22/2017) and via home health (06/22/2017 - 08/09/2017).  Past medical history includes vascular dementia.       Treatment Provided   Treatment provided  Cognitive-Linquistic      Pain Assessment   Pain Assessment  No/denies pain      Cognitive-Linquistic Treatment   Treatment focused on  Aphasia;Apraxia    Skilled Treatment  Patient repeated initial /p/, /w/ 1 syllable words with 80% accuracy given regular verbal and visual cues to look at SLP for visual cues for mouth placement. Patient identified answers to Wh-?s by pointing to pictures in FO:25 with 90% accuracy independently.  Assessment / Recommendations / Plan   Plan  Continue with current plan of care      Progression Toward Goals   Progression toward goals  Progressing toward goals       SLP Education - 02/21/18 0816    Education provided  Yes    Education Details  educated patient re: strategies to facilitate and supplement (AAC) verbal expression    Person(s) Educated  Patient    Methods  Explanation;Demonstration;Verbal cues;Tactile cues    Comprehension  Need further instruction;Returned demonstration;Verbal cues required         SLP Long Term Goals - 02/11/18 1751      SLP LONG TERM GOAL #1   Title  Patient will read aloud or repeat words/phrases/sentences, maintaining phonemic accuracy, with 80% accuracy/fluency.      Time  8    Period  Weeks    Status  New     Target Date  04/08/18      SLP LONG TERM GOAL #2   Title  Patient will name objects, pictures, people, and/or activities verbally with 80% accuracy given min verbal, visual or tactile cues.    Time  8    Period  Weeks    Status  New    Target Date  04/08/18      SLP LONG TERM GOAL #3   Title  Patient will complete simple phrases/sentences with 80% accuracy given min verbal, visual or tactile cues.    Time  8    Period  Weeks    Status  New    Target Date  04/08/18      SLP LONG TERM GOAL #4   Title  Patient will use AAC to supplement verbal expression to communicate with familiar listeners with 80% intelligibility given min cues.    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 SLP provided encouragement and explained treatment plan to support verbal communication and to determine most effective AAC to bridge communication when verbal attempts are unsuccessful. Patient appears somewhat resistant to the idea of AAC, shaking his head when explained and appears determined to talk on his own. Pre-morbid cognitive deficits and poor acceptance of severity of his condition increase challenge of progress. Regular cues to look at SLP for cues are required for facilitation of  successful verbal productions.   Speech Therapy Frequency  2x / week    Duration  Other (comment)   8 weeks   Treatment/Interventions  Language facilitation;Cueing hierarchy;SLP instruction and feedback;Multimodal communcation approach;Patient/family education    Potential Considerations  Ability to learn/carryover information;Pain level;Family/community support;Co-morbidities;Previous level of function;Cooperation/participation level;Severity of impairments;Medical prognosis     Consulted and Agree with Plan of Care  Patient       Patient will benefit from skilled therapeutic intervention in order to improve the following deficits and impairments:   Apraxia  Aphasia    Problem List Patient Active Problem List   Diagnosis Date Noted  . CHF (congestive heart failure) ([place]) 01/09/2018  . Iron deficiency anemia due to chronic blood loss 12/20/2017  . Advanced care planning/counseling discussion 12/20/2017  . Hypoxia 11/02/2017  . Mixed hyperlipidemia 09/21/2017  . Aphasia 05/18/2017  . CAD (coronary  artery disease) 05/18/2017  . History of stroke with residual deficit 05/18/2017  . 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 Knee joint replaced by other means 07/04/2015  . Urinary incontinence 04/27/2015  . Anxiety, generalized 04/07/2015  . History of BCG vaccination 04/07/2015  . GERD (gastroesophageal reflux disease) 03/04/2015  . Depression 02/25/2015  . Degeneration of intervertebral disc of lumbar region 02/23/2015  . Neuritis or radiculitis due to rupture of lumbar intervertebral disc 02/23/2015  . Lumbar stenosis with neurogenic claudication 02/23/2015  . Hypertension 12/02/2014  . Diabetes mellitus without complication ([place]) 71/24/5809  . Benign essential tremor 03/13/2014  . Imbalance 03/13/2014  . Malignant neoplasm of prostate ([place]) 10/07/2012  . ED (erectile dysfunction) of organic origin 02/09/2012    Valiant Dills, MA, CCC-SLP 02/21/2018, 8:19 AM  [place] MAIN [facility] SERVICES [address] [place], [place], [address] Phone: [phone]   Fax:  [phone]   Name: Todd [name] MRN: [id number] Date of Birth: 1930-11-19

## 2018-02-22 ENCOUNTER — Other Ambulatory Visit: Payer: Self-pay | Admitting: Family Medicine

## 2018-02-26 ENCOUNTER — Telehealth: Payer: Self-pay | Admitting: Family Medicine

## 2018-02-26 MED ORDER — LORAZEPAM 0.5 MG PO TABS: 0.5000 mg | ORAL_TABLET | Freq: Two times a day (BID) | ORAL | 0 refills | Status: DC | PRN

## 2018-02-26 NOTE — Telephone Encounter (Signed)
Called and spoke with [facility]. They will be in to see Crissman next week.

## 2018-02-26 NOTE — Telephone Encounter (Signed)
Rx sent to his pharmacy. He does not have appointment for follow up scheduled- supposed to see [name] in January, but he's a diabetic and has not had an A1c since last year- so can we get him in sooner than that? Thanks!

## 2018-02-26 NOTE — Telephone Encounter (Signed)
Copied from [place] [phone]. Topic: General - Other >> Feb 26, 2018 12:07 PM Keene Breath wrote: Reason for CRM: Patient's daughter called to speak with the nurse regarding her dad because he has just lost a very good friend and she is concerned about her father.  Please advise.  CB# [phone]. >> Feb 26, 2018  1:05 PM Amada Kingfisher, CMA wrote: Left message on machine for pt to return call to the office.  >> Feb 26, 2018  3:14 PM Amada Kingfisher, CMA wrote: [name] returned call. Mr. Madelin Rear girlfriend has passed. [name] stated pt is not handling it well. He is more agitated, sleeping less peaceful. Would like to know if something can be called in to [place] to help him during this time. [name] stated at one time patient had been on clonopin but it did not help his anxiety and was too strong for him. Hoping there is something else they could give him. Please advise.

## 2018-02-28 ENCOUNTER — Ambulatory Visit: Payer: PPO | Admitting: Speech Pathology

## 2018-03-04 ENCOUNTER — Encounter: Payer: Self-pay | Admitting: Speech Pathology

## 2018-03-04 ENCOUNTER — Ambulatory Visit: Payer: PPO | Admitting: Speech Pathology

## 2018-03-04 DIAGNOSIS — R482 Apraxia: Secondary | ICD-10-CM | POA: Diagnosis not present

## 2018-03-04 DIAGNOSIS — R4701 Aphasia: Secondary | ICD-10-CM

## 2018-03-04 NOTE — Therapy (Signed)
[place] MAIN [facility] SERVICES [address] [place], [place], [address] Phone: [phone]   Fax:  [phone]  Speech Language Pathology Treatment  Patient Details  Name: Todd [name] MRN: [id number] Date of Birth: [birth year] Referring Provider (SLP): Merrie Roof, [place]   Encounter Date: 03/04/2018  End of Session - 03/04/18 1624    Visit Number  5    Number of Visits  17    Date for SLP Re-Evaluation  04/08/18    Authorization Type  4/10 medicare progress note    SLP Start Time  1300    SLP Stop Time   6967    SLP Time Calculation (min)  55 min    Activity Tolerance  Patient tolerated treatment well       Past Medical History:  Diagnosis Date  . Anxiety   . Aortic regurgitation    a. 03/2017 Echo: Mod AI; b. 05/2017 Echo: Triv AI.  [name] Kitchen Arthritis    osteoarthritis-left knee  . CAD (coronary artery disease)    a. Reported h/o cath ~ 2008 - ? small vessel dzs. No PCI performed; b. 03/2017 MV: EF 45-54%, no ischemia/infarct.  . Depression   . Diabetes mellitus without complication ([place])   . Diastolic dysfunction    a. 03/2017 Echo: EF 55-65%; b. 05/2017 Echo: EF 55-60%, no rwma, Gr1 DD. Triv AI.  Mildly dil LA.  . ED (erectile dysfunction)   . Elevated troponin    a. chronic mild elevation - 0.03->0.04.  [name] Kitchen GERD (gastroesophageal reflux disease)   . Glaucoma   . Hyperlipidemia   . Hypertension   . Iron deficiency anemia due to chronic blood loss   . Left pontine stroke w/ cerebrovascular disease(HCC)    a. 05/2017 MRI/A: acute to subacute L pontine infarct. Chronic left thalamic lacunar infarct. Severe basilar artery stenosis w/ radiographic string sign of the distal 1/3. Mild to moderate L PCA P2 segment stenosis; b. 05/2017 Carotid U/S: <50% bilat ICA stenosis.  . Lumbago    Lumbosacral Neuritis  . Prostate cancer [facility])     Past Surgical History:  Procedure Laterality Date  . CARPAL TUNNEL RELEASE    . REPLACEMENT TOTAL KNEE  BILATERAL      There were no vitals filed for this visit.  Subjective Assessment - 03/04/18 1621    Subjective  Patient smiled throughout treatment session.    Currently in Pain?  No/denies            ADULT SLP TREATMENT - 03/04/18 0001      General Information   Behavior/Cognition  Alert;Cooperative;Pleasant mood    Patient Positioning  Upright in chair    HPI   84 year old man S/P left pontine stroke 05/17/2017.  The patient received rehab services, including speech therapy, at a SNF (05/21/2017 - 06/22/2017) and via home health (06/22/2017 - 08/09/2017).  Past medical history includes vascular dementia.       Treatment Provided   Treatment provided  Cognitive-Linquistic      Pain Assessment   Pain Assessment  No/denies pain      Cognitive-Linquistic Treatment   Treatment focused on  Aphasia;Apraxia    Skilled Treatment Patient repeated monosyllabic high frequency initial /d, f, k, m, r, n, p, w, h/ words given regular verbal and visual cues with 80% accuracy. Noted improved patient attendance to visual cues in comparison to past treatment sessions.  Patient completed simple phrases using target words used for repetition with 90% accuracy given  regular visual cues.  Noted increased number of intelligible spontaneous verbalizations throughout treatment today.     Assessment / Recommendations / Plan   Plan  Continue with current plan of care      Progression Toward Goals   Progression toward goals  Progressing toward goals       SLP Education - 03/04/18 1623    Education provided  Yes    Education Details  Educated patient re: strategies to facilitate and supplement verbal expression    Person(s) Educated  Patient    Methods  Explanation;Demonstration;Verbal cues    Comprehension  Need further instruction;Returned demonstration;Verbal cues required         SLP Long Term Goals - 02/11/18 1751      SLP LONG TERM GOAL #1   Title  Patient will read aloud or repeat  words/phrases/sentences, maintaining phonemic accuracy, with 80% accuracy/fluency.      Time  8    Period  Weeks    Status  New    Target Date  04/08/18      SLP LONG TERM GOAL #2   Title  Patient will name objects, pictures, people, and/or activities verbally with 80% accuracy given min verbal, visual or tactile cues.    Time  8    Period  Weeks    Status  New    Target Date  04/08/18      SLP LONG TERM GOAL #3   Title  Patient will complete simple phrases/sentences with 80% accuracy given min verbal, visual or tactile cues.    Time  8    Period  Weeks    Status  New    Target Date  04/08/18      SLP LONG TERM GOAL #4   Title  Patient will use AAC to supplement verbal expression to communicate with familiar listeners with 80% intelligibility given min cues.    Time  8    Period  Weeks    Status  New    Target Date  04/08/18       Plan - 03/04/18 1624    Clinical Impression Statement  Patient demonstrated improved repetition and naming of common objects. Noted most successful clear productions included initial /b/, /f/, /w/, /n/, /m/, and /p/ monosyllabic words. Patient successful repeating initial /h/, /d/and /r/ monosyllabic words given visual cues for mouth placement. 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 today given constant redirection towards successful productions.[name] Kitchen    Speech Therapy Frequency  2x / week    Duration  Other (comment)   8 weeks   Treatment/Interventions  Language facilitation;Cueing hierarchy;SLP instruction and feedback;Multimodal communcation approach;Patient/family education    Potential Considerations  Ability to learn/carryover information;Pain level;Family/community support;Co-morbidities;Previous level of function;Cooperation/participation level;Severity of impairments;Medical prognosis    Consulted and Agree with Plan of Care  Patient       Patient will benefit from skilled therapeutic intervention in order to improve the following deficits  and impairments:   Apraxia  Aphasia    Problem List Patient Active Problem List   Diagnosis Date Noted  . CHF (congestive heart failure) ([place]) 01/09/2018  . Iron deficiency anemia due to chronic blood loss 12/20/2017  . Advanced care planning/counseling discussion 12/20/2017  . Hypoxia 11/02/2017  . Mixed hyperlipidemia 09/21/2017  . Aphasia 05/18/2017  . CAD (coronary artery disease) 05/18/2017  . History of stroke with residual deficit 05/18/2017  . 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 Knee joint replaced by other means 07/04/2015  .  Urinary incontinence 04/27/2015  . Anxiety, generalized 04/07/2015  . History of BCG vaccination 04/07/2015  . GERD (gastroesophageal reflux disease) 03/04/2015  . Depression 02/25/2015  . Degeneration of intervertebral disc of lumbar region 02/23/2015  . Neuritis or radiculitis due to rupture of lumbar intervertebral disc 02/23/2015  . Lumbar stenosis with neurogenic claudication 02/23/2015  . Hypertension 12/02/2014  . Diabetes mellitus without complication ([place]) 12/87/8676  . Benign essential tremor 03/13/2014  . Imbalance 03/13/2014  . Malignant neoplasm of prostate ([place]) 10/07/2012  . ED (erectile dysfunction) of organic origin 02/09/2012    Rulon Eisenmenger, MA, CCC-SLP 03/04/2018, 4:32 PM  [place] MAIN [facility] SERVICES [address] [place], [place], [address] Phone: [phone]   Fax:  [phone]   Name: Todd [name] MRN: [id number] Date of Birth: 1931/03/31

## 2018-03-06 ENCOUNTER — Ambulatory Visit (INDEPENDENT_AMBULATORY_CARE_PROVIDER_SITE_OTHER): Payer: PPO | Admitting: Family Medicine

## 2018-03-06 ENCOUNTER — Encounter: Payer: Self-pay | Admitting: Family Medicine

## 2018-03-06 VITALS — BP 135/67 | HR 74 | Temp 97.8°F | Ht 67.0 in | Wt 177.2 lb

## 2018-03-06 DIAGNOSIS — Z23 Encounter for immunization: Secondary | ICD-10-CM

## 2018-03-06 DIAGNOSIS — I509 Heart failure, unspecified: Secondary | ICD-10-CM | POA: Diagnosis not present

## 2018-03-06 DIAGNOSIS — R0902 Hypoxemia: Secondary | ICD-10-CM | POA: Diagnosis not present

## 2018-03-06 DIAGNOSIS — E1140 Type 2 diabetes mellitus with diabetic neuropathy, unspecified: Secondary | ICD-10-CM | POA: Diagnosis not present

## 2018-03-06 DIAGNOSIS — I10 Essential (primary) hypertension: Secondary | ICD-10-CM

## 2018-03-06 DIAGNOSIS — E119 Type 2 diabetes mellitus without complications: Secondary | ICD-10-CM

## 2018-03-06 DIAGNOSIS — B351 Tinea unguium: Secondary | ICD-10-CM | POA: Diagnosis not present

## 2018-03-06 LAB — BAYER DCA HB A1C WAIVED: HB A1C (BAYER DCA - WAIVED): 6.1 % (ref <7.0)

## 2018-03-06 NOTE — Assessment & Plan Note (Signed)
Stable with oxygen therapy

## 2018-03-06 NOTE — Assessment & Plan Note (Signed)
The current medical regimen is effective;  continue present plan and medications.  

## 2018-03-06 NOTE — Patient Instructions (Signed)

## 2018-03-06 NOTE — Assessment & Plan Note (Signed)
Treated with oxygen and stable

## 2018-03-06 NOTE — Progress Notes (Signed)
BP 135/67 (BP Location: Left Arm, Patient Position: Sitting, Cuff Size: Normal)   Pulse 74   Temp 97.8 F (36.6 C) (Oral)   Ht 5\' 7"  (1.702 m)   Wt 177 lb 3.2 oz (80.4 kg)   SpO2 (!) 75%   BMI 27.75 kg/m    Subjective:    Patient ID: Todd [name], male    DOB: 08-16-1930, 84 y.o.   MRN: [id number]  HPI: Todd [name] is a 84 y.o. male  Chief Complaint  Patient presents with  . Diabetes    A1C F/U  . Fall    Patient fell two weeks ago, has bruise on left side  . Shaking    Patient complaining of his hands shaking  Patient accompanied by his 2 daughters who assist primarily with history.  Patient for routine follow-up diabetes. Doing well with medications no low blood sugar spells. Blood pressure stable. 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 Patient has oxygen at home and in the car just did not bring into the office today is where his oxygen drops when not using his oxygen.   Patient also with foot problems from diabetes.    Has some sores on his top of his toes of his left foot secondary to hammertoes.  Patient will need special shoes this was information obtained during a face-to-face visit with the patient.  Relevant past medical, surgical, family and social history reviewed and updated as indicated. Interim medical history since our last visit reviewed. Allergies and medications reviewed and updated.  Review of Systems  Constitutional: Negative.   Respiratory: Negative.   Cardiovascular: Negative.     Per HPI unless specifically indicated above     Objective:    BP 135/67 (BP Location: Left Arm, Patient Position: Sitting, Cuff Size: Normal)   Pulse 74   Temp 97.8 F (36.6 C) (Oral)   Ht 5\' 7"  (1.702 m)   Wt 177 lb 3.2 oz (80.4 kg)   SpO2 (!) 75%   BMI 27.75 kg/m   Wt Readings from Last 3 Encounters:  03/06/18 177 lb 3.2 oz (80.4 kg)  01/04/18 186 lb (84.4 kg)  12/20/17 186 lb (84.4 kg)    Physical Exam  Constitutional:  He is oriented to person, place, and time. He appears well-developed and well-nourished.  Using walker  HENT:  Head: Normocephalic and atraumatic.  Eyes: Conjunctivae and EOM are normal.  Neck: Normal range of motion.  Cardiovascular: Normal rate, regular rhythm and normal heart sounds.  Pulmonary/Chest: Effort normal and breath sounds normal.  Musculoskeletal: Normal range of motion.  Neurological: He is alert and oriented to person, place, and time. He exhibits abnormal muscle tone. Coordination abnormal.  Skin: No erythema.  Psychiatric: He has a normal mood and affect. His behavior is normal. Judgment and thought content normal.    Results for orders placed or performed in visit on 01/24/18  CBC with Differential/Platelet  Result Value Ref Range   WBC 6.5 3.4 - 10.8 x10E3/uL   RBC 3.38 (L) 4.14 - 5.80 x10E6/uL   Hemoglobin 10.3 (L) 13.0 - 17.7 g/dL   Hematocrit 33.0 (L) 37.5 - 51.0 %   MCV 98 (H) 79 - 97 fL   MCH 30.5 26.6 - 33.0 pg   MCHC 31.2 (L) 31.5 - 35.7 g/dL   RDW 13.1 12.3 - 15.4 %   Platelets 280 150 - 450 x10E3/uL   Neutrophils 60 Not Estab. %   Lymphs 25 Not Estab. %  Monocytes 11 Not Estab. %   Eos 2 Not Estab. %   Basos 1 Not Estab. %   Neutrophils Absolute 4.0 1.4 - 7.0 x10E3/uL   Lymphocytes Absolute 1.6 0.7 - 3.1 x10E3/uL   Monocytes Absolute 0.7 0.1 - 0.9 x10E3/uL   EOS (ABSOLUTE) 0.2 0.0 - 0.4 x10E3/uL   Basophils Absolute 0.0 0.0 - 0.2 x10E3/uL   Immature Granulocytes 1 Not Estab. %   Immature Grans (Abs) 0.1 0.0 - 0.1 x10E3/uL  Ferritin  Result Value Ref Range   Ferritin 305 30 - 400 ng/mL  Iron Binding Cap (TIBC)([facility])  Result Value Ref Range   Total Iron Binding Capacity 234 (L) 250 - 450 ug/dL   UIBC 138 111 - 343 ug/dL   Iron 96 38 - 169 ug/dL   Iron Saturation 41 15 - 55 %      Assessment & Plan:   Problem List Items Addressed This Visit      Cardiovascular and Mediastinum   Hypertension    The current medical regimen is  effective;  continue present plan and medications.       CHF (congestive heart failure) (HCC)    Stable with oxygen therapy        Respiratory   Hypoxia    Treated with oxygen and stable        Endocrine   Diabetes mellitus without complication ([place]) - Primary    The current medical regimen is effective;  continue present plan and medications.       Relevant Orders   Bayer DCA Hb A1c Waived   Flu vaccine HIGH DOSE PF (Fluzone High dose) (Completed)       Follow up plan: Return in about 6 months (around 09/05/2018) for Physical Exam, Hemoglobin A1c.

## 2018-03-07 ENCOUNTER — Ambulatory Visit: Payer: PPO | Attending: Family Medicine | Admitting: Speech Pathology

## 2018-03-07 DIAGNOSIS — R482 Apraxia: Secondary | ICD-10-CM | POA: Insufficient documentation

## 2018-03-07 DIAGNOSIS — R4701 Aphasia: Secondary | ICD-10-CM | POA: Insufficient documentation

## 2018-03-11 ENCOUNTER — Encounter: Payer: Self-pay | Admitting: Speech Pathology

## 2018-03-11 ENCOUNTER — Ambulatory Visit: Payer: PPO | Admitting: Speech Pathology

## 2018-03-11 DIAGNOSIS — I501 Left ventricular failure: Secondary | ICD-10-CM | POA: Diagnosis not present

## 2018-03-11 DIAGNOSIS — I2510 Atherosclerotic heart disease of native coronary artery without angina pectoris: Secondary | ICD-10-CM | POA: Diagnosis not present

## 2018-03-11 DIAGNOSIS — R482 Apraxia: Secondary | ICD-10-CM | POA: Diagnosis not present

## 2018-03-11 DIAGNOSIS — R0902 Hypoxemia: Secondary | ICD-10-CM | POA: Diagnosis not present

## 2018-03-11 DIAGNOSIS — R4701 Aphasia: Secondary | ICD-10-CM | POA: Diagnosis not present

## 2018-03-11 NOTE — Therapy (Signed)
[place] MAIN [facility] SERVICES [address] [place], [place], [address] Phone: [phone]   Fax:  [phone]  Speech Language Pathology Treatment  Patient Details  Name: Todd [name] MRN: [id number] Date of Birth: 11-22-1930 Referring Provider (SLP): Merrie Roof, [place]   Encounter Date: 03/11/2018  End of Session - 03/11/18 1619    Visit Number  6    Number of Visits  17    Date for SLP Re-Evaluation  04/08/18    Authorization Type  5/10 medicare progress note    SLP Start Time  1500    SLP Stop Time   1610    SLP Time Calculation (min)  70 min    Activity Tolerance  Patient tolerated treatment well       Past Medical History:  Diagnosis Date  . Anxiety   . Aortic regurgitation    a. 03/2017 Echo: Mod AI; b. 05/2017 Echo: Triv AI.  [name] Kitchen Arthritis    osteoarthritis-left knee  . CAD (coronary artery disease)    a. Reported h/o cath ~ 2008 - ? small vessel dzs. No PCI performed; b. 03/2017 MV: EF 45-54%, no ischemia/infarct.  . Depression   . Diabetes mellitus without complication ([place])   . Diastolic dysfunction    a. 03/2017 Echo: EF 55-65%; b. 05/2017 Echo: EF 55-60%, no rwma, Gr1 DD. Triv AI.  Mildly dil LA.  . ED (erectile dysfunction)   . Elevated troponin    a. chronic mild elevation - 0.03->0.04.  [name] Kitchen GERD (gastroesophageal reflux disease)   . Glaucoma   . Hyperlipidemia   . Hypertension   . Iron deficiency anemia due to chronic blood loss   . Left pontine stroke w/ cerebrovascular disease(HCC)    a. 05/2017 MRI/A: acute to subacute L pontine infarct. Chronic left thalamic lacunar infarct. Severe basilar artery stenosis w/ radiographic string sign of the distal 1/3. Mild to moderate L PCA P2 segment stenosis; b. 05/2017 Carotid U/S: <50% bilat ICA stenosis.  . Lumbago    Lumbosacral Neuritis  . Prostate cancer [facility])     Past Surgical History:  Procedure Laterality Date  . CARPAL TUNNEL RELEASE    . REPLACEMENT TOTAL KNEE  BILATERAL      There were no vitals filed for this visit.  Subjective Assessment - 03/11/18 1614    Subjective  Patient expressed a lot of frustration over having trouble getting his words out repeating "can't talk".    Currently in Pain?  Yes    Pain Score  10-Worst pain ever    Pain Location  Back    Pain Orientation  Lower    Pain Onset  Other (comment)   3 days ago, Discussed with daughter who confirmed knowledge of current pain   Pain Frequency  Constant    Pain Relieving Factors  unable to report            ADULT SLP TREATMENT - 03/11/18 0001      General Information   Behavior/Cognition  Alert;Cooperative;Pleasant mood    Patient Positioning  Upright in chair    HPI   84 year old man S/P left pontine stroke 05/17/2017.  The patient received rehab services, including speech therapy, at a SNF (05/21/2017 - 06/22/2017) and via home health (06/22/2017 - 08/09/2017).  Past medical history includes vascular dementia.       Treatment Provided   Treatment provided  Cognitive-Linquistic      Pain Assessment   Pain Assessment  0-10  Pain Score  10-Worst pain ever    Pain Location  low back    Pain Intervention(s)  Monitored during session;Other (comment)      Cognitive-Linquistic Treatment   Treatment focused on  Aphasia;Apraxia    Skilled Treatment Patient repeated monosyllabic high frequency initial /d, f, k, m, r, n, p, w, h/ words given regular verbal and visual cues with 80% accuracy. Errors typically deletion or distortion of final consonant. Regular cues required to increase attendance to visual cues. Patient identified pictures on a picture communication board FO:30 on iPad to answer wh- questions with 90% accuracy independently. In addition, patient independently pointed to 2 different but related pictures on board to communicate a meal he enjoys. Patient demonstrated success using board in view. Patient required max verbal and visual cues to toggle between pages to  expand choices. Static board will be most consistent successful communication choice for AAC.     Assessment / Recommendations / Plan   Plan      Progression Toward Goals   Progression toward goals       SLP Education - 03/11/18 1618    Education provided  Yes    Education Details  Educated patient re: strategies to facilitate and supplement verbal expression    Person(s) Educated  Patient    Methods  Explanation;Demonstration;Verbal cues    Comprehension  Need further instruction;Returned demonstration;Verbal cues required         SLP Long Term Goals - 02/11/18 1751      SLP LONG TERM GOAL #1   Title  Patient will read aloud or repeat words/phrases/sentences, maintaining phonemic accuracy, with 80% accuracy/fluency.      Time  8    Period  Weeks    Status  New    Target Date  04/08/18      SLP LONG TERM GOAL #2   Title  Patient will name objects, pictures, people, and/or activities verbally with 80% accuracy given min verbal, visual or tactile cues.    Time  8    Period  Weeks    Status  New    Target Date  04/08/18      SLP LONG TERM GOAL #3   Title  Patient will complete simple phrases/sentences with 80% accuracy given min verbal, visual or tactile cues.    Time  8    Period  Weeks    Status  New    Target Date  04/08/18      SLP LONG TERM GOAL #4   Title  Patient will use AAC to supplement verbal expression to communicate with familiar listeners with 80% intelligibility given min cues.    Time  8    Period  Weeks    Status  New    Target Date  04/08/18       Plan - 03/11/18 1620    Clinical Impression Statement  Patient demonstrated consistent success repeating high frequency monosyllabic words given regular cues. Patient demonstrated successful independent use of FO:30 picture communication board to answer wh- questions.  Discussed use of board to decrease communicative pressure and facilitate successful communication with daughter. Daughter stated she  would collaborate with other siblings and caregivers to take pictures of common meal choices, needs/wants to assist with development of communication boards.       Continue with current plan of care        Progressing toward goals      Speech Therapy Frequency  2x / week  Duration  Other (comment)   8 weeks   Treatment/Interventions  Language facilitation;Cueing hierarchy;SLP instruction and feedback;Multimodal communcation approach;Patient/family education    Potential Considerations  Ability to learn/carryover information;Pain level;Family/community support;Co-morbidities;Previous level of function;Cooperation/participation level;Severity of impairments;Medical prognosis    SLP Home Exercise Plan  To be determined    Consulted and Agree with Plan of Care  Patient;Family member/caregiver       Patient will benefit from skilled therapeutic intervention in order to improve the following deficits and impairments:   Apraxia  Aphasia    Problem List Patient Active Problem List   Diagnosis Date Noted  . CHF (congestive heart failure) ([place]) 01/09/2018  . Iron deficiency anemia due to chronic blood loss 12/20/2017  . Advanced care planning/counseling discussion 12/20/2017  . Hypoxia 11/02/2017  . Mixed hyperlipidemia 09/21/2017  . Aphasia 05/18/2017  . CAD (coronary artery disease) 05/18/2017  . History of stroke with residual deficit 05/18/2017  . 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 Knee joint replaced by other means 07/04/2015  . Urinary incontinence 04/27/2015  . Anxiety, generalized 04/07/2015  . History of BCG vaccination 04/07/2015  . GERD (gastroesophageal reflux disease) 03/04/2015  . Depression 02/25/2015  . Degeneration of intervertebral disc of lumbar region 02/23/2015  . Neuritis or radiculitis due to rupture of lumbar intervertebral disc 02/23/2015  . Lumbar stenosis with neurogenic claudication 02/23/2015  . Hypertension 12/02/2014  . Diabetes  mellitus without complication ([place]) 18/34/3735  . Benign essential tremor 03/13/2014  . Imbalance 03/13/2014  . Malignant neoplasm of prostate ([place]) 10/07/2012  . ED (erectile dysfunction) of organic origin 02/09/2012    Rulon Eisenmenger, MA, CCC-SLP 03/11/2018, 4:32 PM  [place] MAIN [facility] SERVICES [address] [place], [place], [address] Phone: [phone]   Fax:  [phone]   Name: Todd [name] MRN: [id number] Date of Birth: 1931/01/28

## 2018-03-14 ENCOUNTER — Ambulatory Visit: Payer: PPO | Admitting: Speech Pathology

## 2018-03-14 ENCOUNTER — Encounter: Payer: Self-pay | Admitting: Speech Pathology

## 2018-03-14 DIAGNOSIS — F0150 Vascular dementia without behavioral disturbance: Secondary | ICD-10-CM | POA: Diagnosis not present

## 2018-03-14 DIAGNOSIS — I69359 Hemiplegia and hemiparesis following cerebral infarction affecting unspecified side: Secondary | ICD-10-CM | POA: Diagnosis not present

## 2018-03-14 DIAGNOSIS — I6350 Cerebral infarction due to unspecified occlusion or stenosis of unspecified cerebral artery: Secondary | ICD-10-CM | POA: Diagnosis not present

## 2018-03-14 DIAGNOSIS — F411 Generalized anxiety disorder: Secondary | ICD-10-CM | POA: Diagnosis not present

## 2018-03-14 DIAGNOSIS — R482 Apraxia: Secondary | ICD-10-CM | POA: Diagnosis not present

## 2018-03-14 DIAGNOSIS — G629 Polyneuropathy, unspecified: Secondary | ICD-10-CM | POA: Diagnosis not present

## 2018-03-14 DIAGNOSIS — I69328 Other speech and language deficits following cerebral infarction: Secondary | ICD-10-CM | POA: Diagnosis not present

## 2018-03-14 DIAGNOSIS — G5603 Carpal tunnel syndrome, bilateral upper limbs: Secondary | ICD-10-CM | POA: Diagnosis not present

## 2018-03-14 DIAGNOSIS — G250 Essential tremor: Secondary | ICD-10-CM | POA: Diagnosis not present

## 2018-03-14 DIAGNOSIS — G479 Sleep disorder, unspecified: Secondary | ICD-10-CM | POA: Diagnosis not present

## 2018-03-14 DIAGNOSIS — R4701 Aphasia: Secondary | ICD-10-CM

## 2018-03-14 DIAGNOSIS — F329 Major depressive disorder, single episode, unspecified: Secondary | ICD-10-CM | POA: Diagnosis not present

## 2018-03-14 NOTE — Therapy (Signed)
[place] MAIN [facility] SERVICES [address] [place], [place], [address] Phone: [phone]   Fax:  [phone]  Speech Language Pathology Treatment  Patient Details  Name: Todd [name] MRN: [id number] Date of Birth: 1931-05-15 Referring Provider (SLP): Merrie Roof, [place]   Encounter Date: 03/14/2018  End of Session - 03/14/18 1607    Visit Number  7    Number of Visits  17    Date for SLP Re-Evaluation  04/08/18    Authorization Type  6/10 medicare progress note    SLP Start Time  1500    SLP Stop Time   1550    SLP Time Calculation (min)  50 min    Activity Tolerance  Patient tolerated treatment well       Past Medical History:  Diagnosis Date  . Anxiety   . Aortic regurgitation    a. 03/2017 Echo: Mod AI; b. 05/2017 Echo: Triv AI.  [name] Kitchen Arthritis    osteoarthritis-left knee  . CAD (coronary artery disease)    a. Reported h/o cath ~ 2008 - ? small vessel dzs. No PCI performed; b. 03/2017 MV: EF 45-54%, no ischemia/infarct.  . Depression   . Diabetes mellitus without complication ([place])   . Diastolic dysfunction    a. 03/2017 Echo: EF 55-65%; b. 05/2017 Echo: EF 55-60%, no rwma, Gr1 DD. Triv AI.  Mildly dil LA.  . ED (erectile dysfunction)   . Elevated troponin    a. chronic mild elevation - 0.03->0.04.  [name] Kitchen GERD (gastroesophageal reflux disease)   . Glaucoma   . Hyperlipidemia   . Hypertension   . Iron deficiency anemia due to chronic blood loss   . Left pontine stroke w/ cerebrovascular disease(HCC)    a. 05/2017 MRI/A: acute to subacute L pontine infarct. Chronic left thalamic lacunar infarct. Severe basilar artery stenosis w/ radiographic string sign of the distal 1/3. Mild to moderate L PCA P2 segment stenosis; b. 05/2017 Carotid U/S: <50% bilat ICA stenosis.  . Lumbago    Lumbosacral Neuritis  . Prostate cancer [facility])     Past Surgical History:  Procedure Laterality Date  . CARPAL TUNNEL RELEASE    . REPLACEMENT TOTAL KNEE  BILATERAL      There were no vitals filed for this visit.  Subjective Assessment - 03/14/18 1604    Subjective  Patient smiled frequently throughout treatment and triumphantly stated "I counted to ten!" after successfully counting to 10 aloud.    Patient is accompained by:  Family member    Currently in Pain?  No/denies            ADULT SLP TREATMENT - 03/14/18 0001      General Information   Behavior/Cognition  Alert;Cooperative;Pleasant mood    Patient Positioning  Upright in chair    HPI   84 year old man S/P left pontine stroke 05/17/2017.  The patient received rehab services, including speech therapy, at a SNF (05/21/2017 - 06/22/2017) and via home health (06/22/2017 - 08/09/2017).  Past medical history includes vascular dementia.       Treatment Provided   Treatment provided  Cognitive-Linquistic      Cognitive-Linquistic Treatment   Treatment focused on  Aphasia;Apraxia    Skilled Treatment Patient repeated monosyllabic and bisyllabic words given visual cues (2-3 stimulus items each position): Sound position Initial Medial Final  Bilabials 100% 100% 50%  Alveolars 50% 100% 50%  Velars 100% 100% 50%  Sibilants 100% 100% 100%  Affricates 100% 100% 100%  Glides 100% 100% 100%  /s/ blends 100% 100% 33.3%  /l/ blends 100% 33.3% 33.3%  Patient repeated successful stimulus items x5 each given visual model and min to mod verbal cues to attend to visual cues with 90% accuracy.  Patient counted 1-10 x3  and repeated days of the week x3 given visual model and slow pace with 90% accuracy  Patient identified pictures on a picture communication board FO:30 on iPad to answer wh- questions with 100% accuracy independently following initial orientation to each new board.       Assessment / Recommendations / Plan   Plan  Continue with current plan of care      Progression Toward Goals   Progression toward goals  Progressing toward goals       SLP Education - 03/14/18 1606     Education provided  Yes    Education Details  Educated patient re: strategies to facilitate and supplement verbal expression, use of AAC, use of visual cues to improve accuracy of verbal responses    Person(s) Educated  Patient;Child(ren)    Methods  Explanation;Demonstration;Verbal cues    Comprehension  Need further instruction;Returned demonstration;Verbal cues required         SLP Long Term Goals - 02/11/18 1751      SLP LONG TERM GOAL #1   Title  Patient will read aloud or repeat words/phrases/sentences, maintaining phonemic accuracy, with 80% accuracy/fluency.      Time  8    Period  Weeks    Status  New    Target Date  04/08/18      SLP LONG TERM GOAL #2   Title  Patient will name objects, pictures, people, and/or activities verbally with 80% accuracy given min verbal, visual or tactile cues.    Time  8    Period  Weeks    Status  New    Target Date  04/08/18      SLP LONG TERM GOAL #3   Title  Patient will complete simple phrases/sentences with 80% accuracy given min verbal, visual or tactile cues.    Time  8    Period  Weeks    Status  New    Target Date  04/08/18      SLP LONG TERM GOAL #4   Title  Patient will use AAC to supplement verbal expression to communicate with familiar listeners with 80% intelligibility given min cues.    Time  8    Period  Weeks    Status  New    Target Date  04/08/18       Plan - 03/14/18 1608    Clinical Impression Statement  Patient demonstrated improved independence attending to visual cues, requiring fewer verbal cues to look at SLP  for mouth placement to produce words. In addition, patient demonstrated improved independence and accuracy using picture communication board FO:30 to answer wh questions. Daughter reported she and other family members will be compiling a list and possibly taking pictures of foods, clothing items, etc to be used in [employer].   Speech Therapy Frequency  2x / week    Duration   Other (comment)   8 weeks   Treatment/Interventions  Language facilitation;Cueing hierarchy;SLP instruction and feedback;Multimodal communcation approach;Patient/family education    Potential to Achieve Goals  Fair    Potential Considerations  Ability to learn/carryover information;Pain level;Family/community support;Co-morbidities;Previous level of function;Cooperation/participation level;Severity of impairments;Medical prognosis    SLP Home Exercise Plan  To be determined  Consulted and Agree with Plan of Care  Patient;Family member/caregiver       Patient will benefit from skilled therapeutic intervention in order to improve the following deficits and impairments:   Apraxia  Aphasia    Problem List Patient Active Problem List   Diagnosis Date Noted  . CHF (congestive heart failure) ([place]) 01/09/2018  . Iron deficiency anemia due to chronic blood loss 12/20/2017  . Advanced care planning/counseling discussion 12/20/2017  . Hypoxia 11/02/2017  . Mixed hyperlipidemia 09/21/2017  . Aphasia 05/18/2017  . CAD (coronary artery disease) 05/18/2017  . History of stroke with residual deficit 05/18/2017  . 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 Knee joint replaced by other means 07/04/2015  . Urinary incontinence 04/27/2015  . Anxiety, generalized 04/07/2015  . History of BCG vaccination 04/07/2015  . GERD (gastroesophageal reflux disease) 03/04/2015  . Depression 02/25/2015  . Degeneration of intervertebral disc of lumbar region 02/23/2015  . Neuritis or radiculitis due to rupture of lumbar intervertebral disc 02/23/2015  . Lumbar stenosis with neurogenic claudication 02/23/2015  . Hypertension 12/02/2014  . Diabetes mellitus without complication ([place]) 81/18/8677  . Benign essential tremor 03/13/2014  . Imbalance 03/13/2014  . Malignant neoplasm of prostate ([place]) 10/07/2012  . ED (erectile dysfunction) of organic origin 02/09/2012    Rulon Eisenmenger, MA,  CCC-SLP 03/14/2018, 4:11 PM  [place] MAIN [facility] SERVICES [address] [place], [place], [address] Phone: [phone]   Fax:  [phone]   Name: Todd [name] MRN: [id number] Date of Birth: 12-17-1930

## 2018-03-18 ENCOUNTER — Ambulatory Visit: Payer: PPO | Admitting: Speech Pathology

## 2018-03-18 ENCOUNTER — Encounter: Payer: Self-pay | Admitting: Speech Pathology

## 2018-03-18 DIAGNOSIS — R482 Apraxia: Secondary | ICD-10-CM | POA: Diagnosis not present

## 2018-03-18 DIAGNOSIS — R4701 Aphasia: Secondary | ICD-10-CM

## 2018-03-18 NOTE — Therapy (Signed)
[place] MAIN [facility] SERVICES [address] [place], [place], [address] Phone: [phone]   Fax:  [phone]  Speech Language Pathology Treatment  Patient Details  Name: Todd [name] MRN: [id number] Date of Birth: [birth year] Referring Provider (SLP): Merrie Roof, [place]   Encounter Date: 03/18/2018  End of Session - 03/18/18 1637    Visit Number  8    Number of Visits  17    Date for SLP Re-Evaluation  04/08/18    Authorization Type  7/10 medicare progress note    SLP Start Time  1500    SLP Stop Time   1548    SLP Time Calculation (min)  48 min    Activity Tolerance  Patient tolerated treatment well       Past Medical History:  Diagnosis Date  . Anxiety   . Aortic regurgitation    a. 03/2017 Echo: Mod AI; b. 05/2017 Echo: Triv AI.  [name] Kitchen Arthritis    osteoarthritis-left knee  . CAD (coronary artery disease)    a. Reported h/o cath ~ 2008 - ? small vessel dzs. No PCI performed; b. 03/2017 MV: EF 45-54%, no ischemia/infarct.  . Depression   . Diabetes mellitus without complication ([place])   . Diastolic dysfunction    a. 03/2017 Echo: EF 55-65%; b. 05/2017 Echo: EF 55-60%, no rwma, Gr1 DD. Triv AI.  Mildly dil LA.  . ED (erectile dysfunction)   . Elevated troponin    a. chronic mild elevation - 0.03->0.04.  [name] Kitchen GERD (gastroesophageal reflux disease)   . Glaucoma   . Hyperlipidemia   . Hypertension   . Iron deficiency anemia due to chronic blood loss   . Left pontine stroke w/ cerebrovascular disease(HCC)    a. 05/2017 MRI/A: acute to subacute L pontine infarct. Chronic left thalamic lacunar infarct. Severe basilar artery stenosis w/ radiographic string sign of the distal 1/3. Mild to moderate L PCA P2 segment stenosis; b. 05/2017 Carotid U/S: <50% bilat ICA stenosis.  . Lumbago    Lumbosacral Neuritis  . Prostate cancer [facility])     Past Surgical History:  Procedure Laterality Date  . CARPAL TUNNEL RELEASE    . REPLACEMENT TOTAL KNEE  BILATERAL      There were no vitals filed for this visit.  Subjective Assessment - 03/18/18 1634    Subjective  Patient smiled frequently throughout treatment following successful vocalizations. Daughters present and supportive.    Currently in Pain?  No/denies            ADULT SLP TREATMENT - 03/18/18 0001      General Information   Behavior/Cognition  Alert;Cooperative;Pleasant mood    Patient Positioning  Upright in chair    HPI   84 year old man S/P left pontine stroke 05/17/2017.  The patient received rehab services, including speech therapy, at a SNF (05/21/2017 - 06/22/2017) and via home health (06/22/2017 - 08/09/2017).  Past medical history includes vascular dementia.       Treatment Provided   Treatment provided  Cognitive-Linquistic      Cognitive-Linquistic Treatment   Treatment focused on  Aphasia;Apraxia    Skilled Treatment Patient produced initial alveolar monosyllabic words given visual cues with 87% accuracy (greatest difficulty with initial /d/ words), produced final velar monosyllabic words with 90% accuracy and produced final bisyllabic /l/ blends in words with 40% accuracy given max visual cues.  Patient identified pictures on a picture communication board FO:30 on iPad to answer wh- questions with 100% accuracy  independently. Patient repeated names of pictured items located given visual cues with 90% accuracy.  Discussed rationale for use and development of manual communication board to decrease communicative pressure and facilitate successful communication with daughters     Assessment / Recommendations / Plan   Plan  Continue with current plan of care      Progression Toward Goals   Progression toward goals  Progressing toward goals       SLP Education - 03/18/18 1635    Education provided  Yes    Education Details  Re" strategies to facilitate and supplement verbal expression via use of AAC and use of visual cues to improve accuracy of verbal  responses.    Person(s) Educated  Patient;Child(ren)    Methods  Explanation;Demonstration;Verbal cues    Comprehension  Verbalized understanding;Need further instruction;Returned demonstration;Verbal cues required   Daughters verbalized understanding        SLP Long Term Goals - 02/11/18 1751      SLP LONG TERM GOAL #1   Title  Patient will read aloud or repeat words/phrases/sentences, maintaining phonemic accuracy, with 80% accuracy/fluency.      Time  8    Period  Weeks    Status  New    Target Date  04/08/18      SLP LONG TERM GOAL #2   Title  Patient will name objects, pictures, people, and/or activities verbally with 80% accuracy given min verbal, visual or tactile cues.    Time  8    Period  Weeks    Status  New    Target Date  04/08/18      SLP LONG TERM GOAL #3   Title  Patient will complete simple phrases/sentences with 80% accuracy given min verbal, visual or tactile cues.    Time  8    Period  Weeks    Status  New    Target Date  04/08/18      SLP LONG TERM GOAL #4   Title  Patient will use AAC to supplement verbal expression to communicate with familiar listeners with 80% intelligibility given min cues.    Time  8    Period  Weeks    Status  New    Target Date  04/08/18       Plan - 03/18/18 1638    Clinical Impression Statement  Patient continues to demonstrate improved independence attending to visual cues, and improved repetition of initial monosyllabic alveolar and final velar words. Discussed rationale for use and development of manual communication board to decrease communicative pressure and facilitate successful communication with daughters. Daughters stated they would collaborate to take pictures of common meal choices, needs/wants to assist with development of communication boards. Possible future introduction of picture communication board on tablet to increase communicative choices if manual board proves successful.    Speech Therapy Frequency  2x  / week    Duration  Other (comment)   8 weeks   Treatment/Interventions  Language facilitation;Cueing hierarchy;SLP instruction and feedback;Multimodal communcation approach;Patient/family education    Potential Considerations  Ability to learn/carryover information;Pain level;Family/community support;Co-morbidities;Previous level of function;Cooperation/participation level;Severity of impairments;Medical prognosis    SLP Home Exercise Plan  To be determined    Consulted and Agree with Plan of Care  Patient;Family member/caregiver       Patient will benefit from skilled therapeutic intervention in order to improve the following deficits and impairments:   Apraxia  Aphasia    Problem List Patient Active Problem List  Diagnosis Date Noted  . CHF (congestive heart failure) ([place]) 01/09/2018  . Iron deficiency anemia due to chronic blood loss 12/20/2017  . Advanced care planning/counseling discussion 12/20/2017  . Hypoxia 11/02/2017  . Mixed hyperlipidemia 09/21/2017  . Aphasia 05/18/2017  . CAD (coronary artery disease) 05/18/2017  . History of stroke with residual deficit 05/18/2017  . 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 Knee joint replaced by other means 07/04/2015  . Urinary incontinence 04/27/2015  . Anxiety, generalized 04/07/2015  . History of BCG vaccination 04/07/2015  . GERD (gastroesophageal reflux disease) 03/04/2015  . Depression 02/25/2015  . Degeneration of intervertebral disc of lumbar region 02/23/2015  . Neuritis or radiculitis due to rupture of lumbar intervertebral disc 02/23/2015  . Lumbar stenosis with neurogenic claudication 02/23/2015  . Hypertension 12/02/2014  . Diabetes mellitus without complication ([place]) 40/98/1191  . Benign essential tremor 03/13/2014  . Imbalance 03/13/2014  . Malignant neoplasm of prostate ([place]) 10/07/2012  . ED (erectile dysfunction) of organic origin 02/09/2012    Alistair Senft, MA, CCC-SLP 03/18/2018, 4:41  PM  [place] MAIN [facility] SERVICES [address] [place], [place], [address] Phone: [phone]   Fax:  [phone]   Name: Todd [name] MRN: [id number] Date of Birth: [birth year]

## 2018-03-21 ENCOUNTER — Ambulatory Visit: Payer: PPO | Admitting: Speech Pathology

## 2018-03-21 ENCOUNTER — Encounter: Payer: Self-pay | Admitting: Speech Pathology

## 2018-03-21 DIAGNOSIS — R482 Apraxia: Secondary | ICD-10-CM

## 2018-03-21 DIAGNOSIS — R4701 Aphasia: Secondary | ICD-10-CM

## 2018-03-21 NOTE — Therapy (Signed)
[place] MAIN [facility] SERVICES [address] [place], [place], [address] Phone: [phone]   Fax:  [phone]  Speech Language Pathology Treatment  Patient Details  Name: Todd [name] MRN: [id number] Date of Birth: 1930/07/23 Referring Provider (SLP): Merrie Roof, [place]   Encounter Date: 03/21/2018  End of Session - 03/21/18 1620    Visit Number  9    Number of Visits  17    Date for SLP Re-Evaluation  04/08/18    Authorization Type  8/10 medicare progress note    SLP Start Time  1502    SLP Stop Time   1600    SLP Time Calculation (min)  58 min    Activity Tolerance  Patient tolerated treatment well       Past Medical History:  Diagnosis Date  . Anxiety   . Aortic regurgitation    a. 03/2017 Echo: Mod AI; b. 05/2017 Echo: Triv AI.  [name] Kitchen Arthritis    osteoarthritis-left knee  . CAD (coronary artery disease)    a. Reported h/o cath ~ 2008 - ? small vessel dzs. No PCI performed; b. 03/2017 MV: EF 45-54%, no ischemia/infarct.  . Depression   . Diabetes mellitus without complication ([place])   . Diastolic dysfunction    a. 03/2017 Echo: EF 55-65%; b. 05/2017 Echo: EF 55-60%, no rwma, Gr1 DD. Triv AI.  Mildly dil LA.  . ED (erectile dysfunction)   . Elevated troponin    a. chronic mild elevation - 0.03->0.04.  [name] Kitchen GERD (gastroesophageal reflux disease)   . Glaucoma   . Hyperlipidemia   . Hypertension   . Iron deficiency anemia due to chronic blood loss   . Left pontine stroke w/ cerebrovascular disease(HCC)    a. 05/2017 MRI/A: acute to subacute L pontine infarct. Chronic left thalamic lacunar infarct. Severe basilar artery stenosis w/ radiographic string sign of the distal 1/3. Mild to moderate L PCA P2 segment stenosis; b. 05/2017 Carotid U/S: <50% bilat ICA stenosis.  . Lumbago    Lumbosacral Neuritis  . Prostate cancer [facility])     Past Surgical History:  Procedure Laterality Date  . CARPAL TUNNEL RELEASE    . REPLACEMENT TOTAL KNEE  BILATERAL      There were no vitals filed for this visit.  Subjective Assessment - 03/21/18 1618    Subjective  Patient smiled frequently after experiencing success repeating target words.    Currently in Pain?  No/denies            ADULT SLP TREATMENT - 03/21/18 0001      General Information   Behavior/Cognition  Alert;Cooperative;Pleasant mood    Patient Positioning  Upright in chair    HPI   84 year old man S/P left pontine stroke 05/17/2017.  The patient received rehab services, including speech therapy, at a SNF (05/21/2017 - 06/22/2017) and via home health (06/22/2017 - 08/09/2017).  Past medical history includes vascular dementia.       Treatment Provided   Treatment provided  Cognitive-Linquistic      Cognitive-Linquistic Treatment   Treatment focused on  Aphasia;Apraxia    Skilled Treatment Patient produced final alveolar monosyllabic words given visual cues only with 50% accuracy and with 100% accuracy given visual cues and contrastive minimal pairs of errored productions.   Patient produced final alveolar monosyllabic words with 20% accuracy given visual cues only and with 80% accuracy given visual cues and contrastive minimal pairs of errored productions.   Patient identified pictures on a  picture communication board FO:30 on iPad to answer wh- questions with 100% accuracy independently. Patient repeated names of pictured items located given visual cues with 90% accuracy, and with 70% independently.     Assessment / Recommendations / Plan   Plan  Continue with current plan of care      Progression Toward Goals   Progression toward goals  Progressing toward goals       SLP Education - 03/21/18 1620    Education provided  Yes    Education Details  Strategies to facilitate and supplement verbal expression via use of AAC and use of visual cues to improve accuracy of verbal responses    Person(s) Educated  Patient    Methods  Explanation;Demonstration;Verbal cues     Comprehension  Need further instruction;Returned demonstration;Verbal cues required         SLP Long Term Goals - 02/11/18 1751      SLP LONG TERM GOAL #1   Title  Patient will read aloud or repeat words/phrases/sentences, maintaining phonemic accuracy, with 80% accuracy/fluency.      Time  8    Period  Weeks    Status  New    Target Date  04/08/18      SLP LONG TERM GOAL #2   Title  Patient will name objects, pictures, people, and/or activities verbally with 80% accuracy given min verbal, visual or tactile cues.    Time  8    Period  Weeks    Status  New    Target Date  04/08/18      SLP LONG TERM GOAL #3   Title  Patient will complete simple phrases/sentences with 80% accuracy given min verbal, visual or tactile cues.    Time  8    Period  Weeks    Status  New    Target Date  04/08/18      SLP LONG TERM GOAL #4   Title  Patient will use AAC to supplement verbal expression to communicate with familiar listeners with 80% intelligibility given min cues.    Time  8    Period  Weeks    Status  New    Target Date  04/08/18       Plan - 03/21/18 1622    Clinical Impression Statement  Patient continues to demonstrate improved independence attending to visual cues, and improved repetition of final velar and alveolar monosyllabic words when intial errored productions are presented with 5 successful productions of a contrastive minimal pair target word.Daughter Cecille Rubin) reported patient is now having difficulty swallowing at home, and recommended SLP contact patient's DIL [name] who prepares his meals to investigate swallowing difficulties. Called [place] at home who reported he is having difficulties getting "choked" on soft solid foods, and wonders if his ill fitting partials are contributing to difficulty adequately chewing and preparing solid boluses for swallow. [name] reported he is tolerating thin liquids well and denies observing any s/s aspiration with thin liquids.  SLP  recommended try giving patient small bites and sips, slow rate of eating, give meds whole in puree, and provision of extra sauces and gravies to assist bolus cohesion. Will screen swallow function during next session to further investigate and determine if MBSS is warranted.   Speech Therapy Frequency  2x / week    Duration  Other (comment)   8 weeks   Treatment/Interventions  Language facilitation;Cueing hierarchy;SLP instruction and feedback;Multimodal communcation approach;Patient/family education    Potential to [place]  Potential Considerations  Ability to learn/carryover information;Pain level;Family/community support;Co-morbidities;Previous level of function;Cooperation/participation level;Severity of impairments;Medical prognosis    SLP Home Exercise Plan  To be determined       Patient will benefit from skilled therapeutic intervention in order to improve the following deficits and impairments:   Apraxia  Aphasia    Problem List Patient Active Problem List   Diagnosis Date Noted  . CHF (congestive heart failure) ([place]) 01/09/2018  . Iron deficiency anemia due to chronic blood loss 12/20/2017  . Advanced care planning/counseling discussion 12/20/2017  . Hypoxia 11/02/2017  . Mixed hyperlipidemia 09/21/2017  . Aphasia 05/18/2017  . CAD (coronary artery disease) 05/18/2017  . History of stroke with residual deficit 05/18/2017  . 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 Knee joint replaced by other means 07/04/2015  . Urinary incontinence 04/27/2015  . Anxiety, generalized 04/07/2015  . History of BCG vaccination 04/07/2015  . GERD (gastroesophageal reflux disease) 03/04/2015  . Depression 02/25/2015  . Degeneration of intervertebral disc of lumbar region 02/23/2015  . Neuritis or radiculitis due to rupture of lumbar intervertebral disc 02/23/2015  . Lumbar stenosis with neurogenic claudication 02/23/2015  . Hypertension 12/02/2014  . Diabetes  mellitus without complication ([place]) 04/88/8916  . Benign essential tremor 03/13/2014  . Imbalance 03/13/2014  . Malignant neoplasm of prostate ([place]) 10/07/2012  . ED (erectile dysfunction) of organic origin 02/09/2012    Torri Langston, MA, CCC-SLP 03/21/2018, 4:32 PM  [place] MAIN [facility] SERVICES [address] [place], [place], [address] Phone: [phone]   Fax:  [phone]   Name: Todd [name] MRN: [id number] Date of Birth: December 02, 1930

## 2018-03-25 ENCOUNTER — Ambulatory Visit: Payer: PPO | Admitting: Speech Pathology

## 2018-03-25 ENCOUNTER — Encounter: Payer: Self-pay | Admitting: Speech Pathology

## 2018-03-25 DIAGNOSIS — R482 Apraxia: Secondary | ICD-10-CM

## 2018-03-25 DIAGNOSIS — R4701 Aphasia: Secondary | ICD-10-CM

## 2018-03-25 NOTE — Therapy (Signed)
[place] MAIN [facility] SERVICES [address] [place], [place], [address] Phone: [phone]   Fax:  [phone]  Speech Language Pathology Treatment  Patient Details  Name: Todd [name] MRN: [id number] Date of Birth: 03/23/1931 Referring Provider (SLP): Merrie Roof, [place]   Encounter Date: 03/25/2018  End of Session - 03/25/18 1610    Visit Number  10    Number of Visits  17    Date for SLP Re-Evaluation  04/08/18    Authorization Type  9/10 medicare progress note    SLP Start Time  1502    SLP Stop Time   1558    SLP Time Calculation (min)  56 min    Activity Tolerance  Patient tolerated treatment well       Past Medical History:  Diagnosis Date  . Anxiety   . Aortic regurgitation    a. 03/2017 Echo: Mod AI; b. 05/2017 Echo: Triv AI.  [name] Kitchen Arthritis    osteoarthritis-left knee  . CAD (coronary artery disease)    a. Reported h/o cath ~ 2008 - ? small vessel dzs. No PCI performed; b. 03/2017 MV: EF 45-54%, no ischemia/infarct.  . Depression   . Diabetes mellitus without complication ([place])   . Diastolic dysfunction    a. 03/2017 Echo: EF 55-65%; b. 05/2017 Echo: EF 55-60%, no rwma, Gr1 DD. Triv AI.  Mildly dil LA.  . ED (erectile dysfunction)   . Elevated troponin    a. chronic mild elevation - 0.03->0.04.  [name] Kitchen GERD (gastroesophageal reflux disease)   . Glaucoma   . Hyperlipidemia   . Hypertension   . Iron deficiency anemia due to chronic blood loss   . Left pontine stroke w/ cerebrovascular disease(HCC)    a. 05/2017 MRI/A: acute to subacute L pontine infarct. Chronic left thalamic lacunar infarct. Severe basilar artery stenosis w/ radiographic string sign of the distal 1/3. Mild to moderate L PCA P2 segment stenosis; b. 05/2017 Carotid U/S: <50% bilat ICA stenosis.  . Lumbago    Lumbosacral Neuritis  . Prostate cancer [facility])     Past Surgical History:  Procedure Laterality Date  . CARPAL TUNNEL RELEASE    . REPLACEMENT TOTAL KNEE  BILATERAL      There were no vitals filed for this visit.  Subjective Assessment - 03/25/18 1608    Subjective  Patient smiled throughout treatment and produced more intelligible spontaneous utterances than in prior sessions.    Currently in Pain?  No/denies            ADULT SLP TREATMENT - 03/25/18 0001      General Information   Behavior/Cognition  Alert;Cooperative;Pleasant mood    Patient Positioning  Upright in chair    HPI   84 year old man S/P left pontine stroke 05/17/2017.  The patient received rehab services, including speech therapy, at a SNF (05/21/2017 - 06/22/2017) and via home health (06/22/2017 - 08/09/2017).  Past medical history includes vascular dementia.       Treatment Provided   Treatment provided  Cognitive-Linquistic      Cognitive-Linquistic Treatment   Treatment focused on  Aphasia;Apraxia    Skilled Treatment Patient identified pictures on a picture communication board FO:30 on iPad to answer mod complex wh- questions with 80% accuracy independently.   Patient produced final alveolar monosyllabic words given visual cues only with 84% accuracy and with 100% accuracy given visual cues and contrastive minimal pairs of errored productions. Patient produced final velar monosyllabic words with 94%  accuracy given visual cues only. Patient repeated target words 5x each.  Patient tolerated single sips thin liquid and tsp puree with no overt s/s aspiration and adequate oral prep. Patient tolerated small bites soft solid with no overt s/s aspiration with mild oral transit delay likely due to ill fitting dentures. Swallow function appears WFL.     Assessment / Recommendations / Plan   Plan  Continue with current plan of care      Progression Toward Goals   Progression toward goals  Progressing toward goals       SLP Education - 03/25/18 1609    Education provided  Yes    Education Details  Strategies to facilitate and supplement verbal expression via use of AAC  and use of visual cues to improve accuracy of verbal responses    Person(s) Educated  Patient    Methods  Explanation;Demonstration;Verbal cues    Comprehension  Need further instruction;Returned demonstration;Verbal cues required         SLP Long Term Goals - 02/11/18 1751      SLP LONG TERM GOAL #1   Title  Patient will read aloud or repeat words/phrases/sentences, maintaining phonemic accuracy, with 80% accuracy/fluency.      Time  8    Period  Weeks    Status  New    Target Date  04/08/18      SLP LONG TERM GOAL #2   Title  Patient will name objects, pictures, people, and/or activities verbally with 80% accuracy given min verbal, visual or tactile cues.    Time  8    Period  Weeks    Status  New    Target Date  04/08/18      SLP LONG TERM GOAL #3   Title  Patient will complete simple phrases/sentences with 80% accuracy given min verbal, visual or tactile cues.    Time  8    Period  Weeks    Status  New    Target Date  04/08/18      SLP LONG TERM GOAL #4   Title  Patient will use AAC to supplement verbal expression to communicate with familiar listeners with 80% intelligibility given min cues.    Time  8    Period  Weeks    Status  New    Target Date  04/08/18       Plan - 03/25/18 1611    Clinical Impression Statement  Patient demonstrated marked improvement repeating monosyllabic final alveolar words.  Use of contrastive minimal pairs is consistently successful assisting patient differentiate and produce target sounds. Swallow screen performed today during treatment due to caregiver concerns with increased trouble swallowing. Swallow function appeared [facility] throughout screen . No s/s aspiration observed with thin liquids, puree, or soft solids. Oral phase largely [facility] but c/b mild oral transit delay likely due to ill fitting partials. Patient demonstrated adequate oral prep, coordination and cleared oral cavity. Vocal quality remained clear throughout session. Recommend  continue to follow safe swallow recommendations discussed in phone conversation, I.e. Eat slowly, small bites and sips, use extra gravies and sauces to facilitate bolus cohesion, give meds whole in puree, sit fully upright for all PO intake. SLP to discuss with caregiver [name] via phone. Daughter Cecille Rubin stated they are working on getting a list together of common wants/needs, foods together to give to SLP for development of a manual communication board.   Speech Therapy Frequency  2x / week    Duration  Other (comment)  8 weeks   Treatment/Interventions  Language facilitation;Cueing hierarchy;SLP instruction and feedback;Multimodal communcation approach;Patient/family education    Potential to Achieve Goals  [place]    Potential Considerations  Ability to learn/carryover information;Pain level;Family/community support;Co-morbidities;Previous level of function;Cooperation/participation level;Severity of impairments;Medical prognosis    SLP Home Exercise Plan  To be determined    Consulted and Agree with Plan of Care  Patient;Family member/caregiver       Patient will benefit from skilled therapeutic intervention in order to improve the following deficits and impairments:   Apraxia  Aphasia    Problem List Patient Active Problem List   Diagnosis Date Noted  . CHF (congestive heart failure) ([place]) 01/09/2018  . Iron deficiency anemia due to chronic blood loss 12/20/2017  . Advanced care planning/counseling discussion 12/20/2017  . Hypoxia 11/02/2017  . Mixed hyperlipidemia 09/21/2017  . Aphasia 05/18/2017  . CAD (coronary artery disease) 05/18/2017  . History of stroke with residual deficit 05/18/2017  . 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 Knee joint replaced by other means 07/04/2015  . Urinary incontinence 04/27/2015  . Anxiety, generalized 04/07/2015  . History of BCG vaccination 04/07/2015  . GERD (gastroesophageal reflux disease) 03/04/2015  . Depression 02/25/2015   . Degeneration of intervertebral disc of lumbar region 02/23/2015  . Neuritis or radiculitis due to rupture of lumbar intervertebral disc 02/23/2015  . Lumbar stenosis with neurogenic claudication 02/23/2015  . Hypertension 12/02/2014  . Diabetes mellitus without complication ([place]) 75/44/9201  . Benign essential tremor 03/13/2014  . Imbalance 03/13/2014  . Malignant neoplasm of prostate ([place]) 10/07/2012  . ED (erectile dysfunction) of organic origin 02/09/2012    Jagar Lua, MA, CCC-SLP 03/25/2018, 4:19 PM  [place] MAIN [facility] SERVICES [address] [place], [place], [address] Phone: [phone]   Fax:  [phone]   Name: Todd [name] MRN: [id number] Date of Birth: [birth year]

## 2018-03-27 ENCOUNTER — Telehealth: Payer: Self-pay | Admitting: Family Medicine

## 2018-03-27 NOTE — Telephone Encounter (Signed)
Copied from [place] [id number]. Topic: General - Other >> Mar 27, 2018  8:28 AM Ivar Drape wrote: Reason for CRM: Patient's daughter, [name] [phone], stated that her father gets out of breath doing normal day to day things and she would like to know if she can turn his oxygen tank up.  It is presently on #2 and the tank is from [place].

## 2018-03-28 ENCOUNTER — Ambulatory Visit: Payer: PPO | Admitting: Speech Pathology

## 2018-03-28 ENCOUNTER — Telehealth: Payer: Self-pay

## 2018-03-28 ENCOUNTER — Encounter: Payer: Self-pay | Admitting: Speech Pathology

## 2018-03-28 DIAGNOSIS — R482 Apraxia: Secondary | ICD-10-CM | POA: Diagnosis not present

## 2018-03-28 DIAGNOSIS — R4701 Aphasia: Secondary | ICD-10-CM

## 2018-03-28 NOTE — Telephone Encounter (Signed)
Spoke with patient's daughter, patient is giving out of breath with everyday tasks, he is currently on 2 L of oxygen now, they would like to know if this can be increased? Message was sent to [place] on 03/27/18, but did not get a response.    Copied from [place] [phone]. Topic: General - Other >> Mar 28, 2018  3:20 PM Ivar Drape wrote: Reason for CRM:   Patient received a prescription for diabetic shoes but the patient's family member lost the prescription.  They want to know if they can get another prescription for the patient's diabetic shoes.  They can come pick up the prescription when it's done. >> Mar 28, 2018  5:03 PM Guadalupe Maple, MD wrote: Prescriptions on my desk

## 2018-03-28 NOTE — Therapy (Signed)
[place] MAIN [facility] SERVICES [address] [place], [place], [address] Phone: [phone]   Fax:  [phone]  Speech Language Pathology Progress Note/Treatment  Patient Details  Name: Todd [name] MRN: [id number] Date of Birth: [birth year] Referring Provider (SLP): Merrie Roof, [place]   Encounter Date: 03/28/2018  End of Session - 03/28/18 1607    Visit Number  11    Number of Visits  17    Date for SLP Re-Evaluation  04/08/18    Authorization Type  10/10 medicare progress note    SLP Start Time  1455    SLP Stop Time   1555    SLP Time Calculation (min)  60 min    Activity Tolerance  Patient tolerated treatment well       Past Medical History:  Diagnosis Date  . Anxiety   . Aortic regurgitation    a. 03/2017 Echo: Mod AI; b. 05/2017 Echo: Triv AI.  [name] Kitchen Arthritis    osteoarthritis-left knee  . CAD (coronary artery disease)    a. Reported h/o cath ~ 2008 - ? small vessel dzs. No PCI performed; b. 03/2017 MV: EF 45-54%, no ischemia/infarct.  . Depression   . Diabetes mellitus without complication ([place])   . Diastolic dysfunction    a. 03/2017 Echo: EF 55-65%; b. 05/2017 Echo: EF 55-60%, no rwma, Gr1 DD. Triv AI.  Mildly dil LA.  . ED (erectile dysfunction)   . Elevated troponin    a. chronic mild elevation - 0.03->0.04.  [name] Kitchen GERD (gastroesophageal reflux disease)   . Glaucoma   . Hyperlipidemia   . Hypertension   . Iron deficiency anemia due to chronic blood loss   . Left pontine stroke w/ cerebrovascular disease(HCC)    a. 05/2017 MRI/A: acute to subacute L pontine infarct. Chronic left thalamic lacunar infarct. Severe basilar artery stenosis w/ radiographic string sign of the distal 1/3. Mild to moderate L PCA P2 segment stenosis; b. 05/2017 Carotid U/S: <50% bilat ICA stenosis.  . Lumbago    Lumbosacral Neuritis  . Prostate cancer [facility])     Past Surgical History:  Procedure Laterality Date  . CARPAL TUNNEL RELEASE    .  REPLACEMENT TOTAL KNEE BILATERAL      There were no vitals filed for this visit.  Subjective Assessment - 03/28/18 1604    Subjective  Patient appeared to enjoy treatment activities smiling and producing several 2-3 intelligible spontaneous utterances throughout today's session.    Currently in Pain?  No/denies            ADULT SLP TREATMENT - 03/28/18 0001      General Information   Behavior/Cognition  Alert;Cooperative;Pleasant mood    Patient Positioning  Upright in chair    HPI   84 year old man S/P left pontine stroke 05/17/2017.  The patient received rehab services, including speech therapy, at a SNF (05/21/2017 - 06/22/2017) and via home health (06/22/2017 - 08/09/2017).  Past medical history includes vascular dementia.       Treatment Provided   Treatment provided  Cognitive-Linquistic      Cognitive-Linquistic Treatment   Treatment focused on  Aphasia;Apraxia    Skilled Treatment Patient repeated initial /h/ monosyllabic words given visual cues only with 53% accuracy and with 100% accuracy given visual cues and contrastive minimal pairs of errored productions.   Patient repeated 2 word mono & bisyllabic functional phrases with 83% accuracy given visual cues for articulatory placement and pacing. Patient repeated target words/phrases 5x  each. Noted increased difficulty with productions containing dipthongs. Patient largely unable to imitate dipthongs in isolation at this time.  Patient named pictured objects aloud with 88% accuracy, errored productions due to apraxia not aphasia. It was clear patient knew pictured item, given visual cues for articulatory placement patient repeated target words with 100% accuracy.     Assessment / Recommendations / Plan   Plan  Continue with current plan of care      Progression Toward Goals   Progression toward goals  Progressing toward goals       SLP Education - 03/28/18 1605    Education provided  Yes    Education Details   Strategies to faciliate and supplement succesful verbal and nonverbal expression    Person(s) Educated  Patient    Methods  Explanation;Demonstration;Verbal cues    Comprehension  Need further instruction;Returned demonstration;Verbal cues required         SLP Long Term Goals - 03/28/18 1611      SLP LONG TERM GOAL #1   Title  Patient will repeat words/phrases/sentences, maintaining phonemic accuracy, with 80% accuracy/fluency.      Baseline  repeating monosyllabic words with     Time  16    Period  Weeks    Status  Revised    Target Date  06/03/18      SLP LONG TERM GOAL #2   Title  Patient will name objects, pictures, people, and/or activities verbally with 80% accuracy given min verbal, visual or tactile cues.    Status  Partially Met    Target Date  04/08/18      SLP LONG TERM GOAL #3   Title  Patient will complete simple phrases/sentences with 80% accuracy given min verbal, visual or tactile cues.    Time  16    Period  Weeks    Status  On-going    Target Date  06/03/18      SLP LONG TERM GOAL #4   Title  Patient will use AAC to supplement verbal expression to communicate with familiar listeners with 80% intelligibility given min cues.    Time  16    Period  Weeks    Status  On-going    Target Date  06/03/18       Plan - 03/28/18 1607    Clinical Impression Statement  Patient demonstrated improved accuracy repeating 2 word functional phrases given visual cues, pacing, and use of minimal pairs for errored words as needed. Noted increased number of spontaneous intellgible verbalizations throughout the past 2 treatment sessions. Awaiting list of common meal items, needs and wants from daughters to create manual communication board. Patient has partially met goal 2, and is making steady progress on remaining goals. Patient will continue to require skilled ST services  to increase functional effective communication.   Speech Therapy Frequency  2x / week    Duration  Other  (comment)   8 weeks   Treatment/Interventions  Language facilitation;Cueing hierarchy;SLP instruction and feedback;Multimodal communcation approach;Patient/family education    Potential to Achieve Goals  Fair    Potential Considerations  Ability to learn/carryover information;Pain level;Family/community support;Co-morbidities;Previous level of function;Cooperation/participation level;Severity of impairments;Medical prognosis    SLP Home Exercise Plan  To be determined    Consulted and Agree with Plan of Care  Patient       Patient will benefit from skilled therapeutic intervention in order to improve the following deficits and impairments:   Apraxia  Aphasia    Problem List  Patient Active Problem List   Diagnosis Date Noted  . CHF (congestive heart failure) ([place]) 01/09/2018  . Iron deficiency anemia due to chronic blood loss 12/20/2017  . Advanced care planning/counseling discussion 12/20/2017  . Hypoxia 11/02/2017  . Mixed hyperlipidemia 09/21/2017  . Aphasia 05/18/2017  . CAD (coronary artery disease) 05/18/2017  . History of stroke with residual deficit 05/18/2017  . 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 Knee joint replaced by other means 07/04/2015  . Urinary incontinence 04/27/2015  . Anxiety, generalized 04/07/2015  . History of BCG vaccination 04/07/2015  . GERD (gastroesophageal reflux disease) 03/04/2015  . Depression 02/25/2015  . Degeneration of intervertebral disc of lumbar region 02/23/2015  . Neuritis or radiculitis due to rupture of lumbar intervertebral disc 02/23/2015  . Lumbar stenosis with neurogenic claudication 02/23/2015  . Hypertension 12/02/2014  . Diabetes mellitus without complication ([place]) 15/52/0802  . Benign essential tremor 03/13/2014  . Imbalance 03/13/2014  . Malignant neoplasm of prostate ([place]) 10/07/2012  . ED (erectile dysfunction) of organic origin 02/09/2012    Shelbey Spindler, MA, CCC-SLP 03/28/2018, 4:24 PM  [place] MAIN [facility] SERVICES [address] [place], [place], [address] Phone: [phone]   Fax:  [phone]   Name: Todd [name] MRN: [id number] Date of Birth: 12-23-1930

## 2018-03-29 NOTE — Telephone Encounter (Signed)
Call daughter

## 2018-03-29 NOTE — Telephone Encounter (Signed)
Please advise 

## 2018-03-29 NOTE — Telephone Encounter (Signed)
Patient's daughter notified.

## 2018-03-29 NOTE — Telephone Encounter (Signed)
Have him go up to 3L this weekend and [name] will call them on Monday.

## 2018-04-01 ENCOUNTER — Encounter: Payer: Self-pay | Admitting: Speech Pathology

## 2018-04-01 ENCOUNTER — Ambulatory Visit: Payer: PPO | Admitting: Speech Pathology

## 2018-04-01 DIAGNOSIS — R4701 Aphasia: Secondary | ICD-10-CM

## 2018-04-01 DIAGNOSIS — R482 Apraxia: Secondary | ICD-10-CM

## 2018-04-01 NOTE — Therapy (Signed)
[place] MAIN [facility] SERVICES [address] [place], [place], [address] Phone: [phone]   Fax:  [phone]  Speech Language Pathology Treatment  Patient Details  Name: Todd [name] MRN: [id number] Date of Birth: Aug 29, 1930 Referring Provider (SLP): Merrie Roof, [place]   Encounter Date: 04/01/2018  End of Session - 04/01/18 1606    Visit Number  12    Number of Visits  17    Date for SLP Re-Evaluation  04/08/18    Authorization Type  1/10 medicare progress note    SLP Start Time  1458    SLP Stop Time   1546    SLP Time Calculation (min)  48 min    Activity Tolerance  Patient tolerated treatment well       Past Medical History:  Diagnosis Date  . Anxiety   . Aortic regurgitation    a. 03/2017 Echo: Mod AI; b. 05/2017 Echo: Triv AI.  [name] Kitchen Arthritis    osteoarthritis-left knee  . CAD (coronary artery disease)    a. Reported h/o cath ~ 2008 - ? small vessel dzs. No PCI performed; b. 03/2017 MV: EF 45-54%, no ischemia/infarct.  . Depression   . Diabetes mellitus without complication ([place])   . Diastolic dysfunction    a. 03/2017 Echo: EF 55-65%; b. 05/2017 Echo: EF 55-60%, no rwma, Gr1 DD. Triv AI.  Mildly dil LA.  . ED (erectile dysfunction)   . Elevated troponin    a. chronic mild elevation - 0.03->0.04.  [name] Kitchen GERD (gastroesophageal reflux disease)   . Glaucoma   . Hyperlipidemia   . Hypertension   . Iron deficiency anemia due to chronic blood loss   . Left pontine stroke w/ cerebrovascular disease(HCC)    a. 05/2017 MRI/A: acute to subacute L pontine infarct. Chronic left thalamic lacunar infarct. Severe basilar artery stenosis w/ radiographic string sign of the distal 1/3. Mild to moderate L PCA P2 segment stenosis; b. 05/2017 Carotid U/S: <50% bilat ICA stenosis.  . Lumbago    Lumbosacral Neuritis  . Prostate cancer [facility])     Past Surgical History:  Procedure Laterality Date  . CARPAL TUNNEL RELEASE    . REPLACEMENT TOTAL KNEE  BILATERAL      There were no vitals filed for this visit.  Subjective Assessment - 04/01/18 1604    Subjective  Patient appeared to enjoy treatment activities smiling with successful verbalizations.    Currently in Pain?  No/denies            ADULT SLP TREATMENT - 04/01/18 0001      General Information   Behavior/Cognition  Alert;Cooperative;Pleasant mood    Patient Positioning  Upright in chair    HPI   84 year old man S/P left pontine stroke 05/17/2017.  The patient received rehab services, including speech therapy, at a SNF (05/21/2017 - 06/22/2017) and via home health (06/22/2017 - 08/09/2017).  Past medical history includes vascular dementia.       Treatment Provided   Treatment provided  Cognitive-Linquistic      Cognitive-Linquistic Treatment   Treatment focused on  Aphasia;Apraxia    Skilled Treatment Patient identified pictures on a picture communication board FO:30 on iPad to answer mod complex wh- questions with 100% accuracy given min verbal and visual cues to scroll thru additional pictures.   Patient repeated initial /h/ monosyllabic words given visual cues only with 80% accuracy and with 100% accuracy given visual cues and contrastive minimal pairs of errored productions.  Patient repeated 2 word mono & bisyllabic functional phrases with 70% accuracy given visual cues for articulatory placement and pacing. Patient repeated target words/phrases 5x each. Noted increased perseveration today often producing previously targeted words. Visual cues for articulatory placement and minimal pairs were successful in eliciting successful productions.   Patient named pictured objects aloud with 90% accuracy, errored productions due to apraxia not aphasia. It was clear patient knew pictured item, given visual cues for articulatory placement patient repeated target words with 100% accuracy.     Assessment / Recommendations / Plan   Plan  Continue with current plan of care       Progression Toward Goals   Progression toward goals  Progressing toward goals       SLP Education - 04/01/18 1605    Education provided  Yes    Education Details  Strategies to facilitate and supplement successful verbal and nonverbal expression    Person(s) Educated  Patient    Methods  Explanation;Demonstration;Verbal cues    Comprehension  Returned demonstration;Verbal cues required;Need further instruction         SLP Long Term Goals - 03/28/18 1611      SLP LONG TERM GOAL #1   Title  Patient will repeat words/phrases/sentences, maintaining phonemic accuracy, with 80% accuracy/fluency.      Baseline  repeating monosyllabic words with     Time  16    Period  Weeks    Status  Revised    Target Date  06/03/18      SLP LONG TERM GOAL #2   Title  Patient will name objects, pictures, people, and/or activities verbally with 80% accuracy given min verbal, visual or tactile cues.    Status  Partially Met    Target Date  04/08/18      SLP LONG TERM GOAL #3   Title  Patient will complete simple phrases/sentences with 80% accuracy given min verbal, visual or tactile cues.    Time  16    Period  Weeks    Status  On-going    Target Date  06/03/18      SLP LONG TERM GOAL #4   Title  Patient will use AAC to supplement verbal expression to communicate with familiar listeners with 80% intelligibility given min cues.    Time  16    Period  Weeks    Status  On-going    Target Date  06/03/18       Plan - 04/01/18 1606    Clinical Impression Statement  Patient required consistent visual cues to ensure articulatory accuracy repeating 1 and 2 word utterances. Minimal pairs continue to facilitate successful productions and diminish perseverative responses. Awaiting list of food items/needs wants from family to create manual communication board. Family to collaborate and send list to SLP.    Speech Therapy Frequency  2x / week    Duration  Other (comment)   8 weeks    Treatment/Interventions  Language facilitation;Cueing hierarchy;SLP instruction and feedback;Multimodal communcation approach;Patient/family education    Potential to Achieve Goals  Fair    Potential Considerations  Ability to learn/carryover information;Pain level;Family/community support;Co-morbidities;Previous level of function;Cooperation/participation level;Severity of impairments;Medical prognosis    SLP Home Exercise Plan  To be determined    Consulted and Agree with Plan of Care  Patient       Patient will benefit from skilled therapeutic intervention in order to improve the following deficits and impairments:   Apraxia  Aphasia    Problem List Patient  Active Problem List   Diagnosis Date Noted  . CHF (congestive heart failure) ([place]) 01/09/2018  . Iron deficiency anemia due to chronic blood loss 12/20/2017  . Advanced care planning/counseling discussion 12/20/2017  . Hypoxia 11/02/2017  . Mixed hyperlipidemia 09/21/2017  . Aphasia 05/18/2017  . CAD (coronary artery disease) 05/18/2017  . History of stroke with residual deficit 05/18/2017  . 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 Knee joint replaced by other means 07/04/2015  . Urinary incontinence 04/27/2015  . Anxiety, generalized 04/07/2015  . History of BCG vaccination 04/07/2015  . GERD (gastroesophageal reflux disease) 03/04/2015  . Depression 02/25/2015  . Degeneration of intervertebral disc of lumbar region 02/23/2015  . Neuritis or radiculitis due to rupture of lumbar intervertebral disc 02/23/2015  . Lumbar stenosis with neurogenic claudication 02/23/2015  . Hypertension 12/02/2014  . Diabetes mellitus without complication ([place]) 46/09/7996  . Benign essential tremor 03/13/2014  . Imbalance 03/13/2014  . Malignant neoplasm of prostate ([place]) 10/07/2012  . ED (erectile dysfunction) of organic origin 02/09/2012    Arleth Mccullar, MA, CCC-SLP 04/01/2018, 4:10 PM  [place] MAIN [facility] SERVICES [address] [place], [place], [address] Phone: [phone]   Fax:  [phone]   Name: Rossi Burdo MRN: [id number] Date of Birth: 11-Oct-1930

## 2018-04-02 NOTE — Telephone Encounter (Signed)
Phone call Discussed with patient's daughter will get oxygen meter from drugstore and determine appropriate oxygen dosing.

## 2018-04-02 NOTE — Telephone Encounter (Signed)
Phone call

## 2018-04-04 ENCOUNTER — Ambulatory Visit: Payer: PPO | Admitting: Speech Pathology

## 2018-04-04 ENCOUNTER — Encounter: Payer: Self-pay | Admitting: Speech Pathology

## 2018-04-04 DIAGNOSIS — R4701 Aphasia: Secondary | ICD-10-CM

## 2018-04-04 DIAGNOSIS — R482 Apraxia: Secondary | ICD-10-CM

## 2018-04-04 NOTE — Therapy (Signed)
[place] MAIN [facility] SERVICES [address] [place], [place], [address] Phone: [phone]   Fax:  [phone]  Speech Language Pathology Treatment  Patient Details  Name: Todd [name] MRN: [id number] Date of Birth: November 07, 1930 Referring Provider (SLP): Merrie Roof, [place]   Encounter Date: 04/04/2018  End of Session - 04/04/18 1611    Visit Number  13    Number of Visits  17    Date for SLP Re-Evaluation  04/08/18    Authorization Type  2/10 medicare progress note    SLP Start Time  1500    SLP Stop Time   1553    SLP Time Calculation (min)  53 min    Activity Tolerance  Patient tolerated treatment well       Past Medical History:  Diagnosis Date  . Anxiety   . Aortic regurgitation    a. 03/2017 Echo: Mod AI; b. 05/2017 Echo: Triv AI.  [name] Kitchen Arthritis    osteoarthritis-left knee  . CAD (coronary artery disease)    a. Reported h/o cath ~ 2008 - ? small vessel dzs. No PCI performed; b. 03/2017 MV: EF 45-54%, no ischemia/infarct.  . Depression   . Diabetes mellitus without complication ([place])   . Diastolic dysfunction    a. 03/2017 Echo: EF 55-65%; b. 05/2017 Echo: EF 55-60%, no rwma, Gr1 DD. Triv AI.  Mildly dil LA.  . ED (erectile dysfunction)   . Elevated troponin    a. chronic mild elevation - 0.03->0.04.  [name] Kitchen GERD (gastroesophageal reflux disease)   . Glaucoma   . Hyperlipidemia   . Hypertension   . Iron deficiency anemia due to chronic blood loss   . Left pontine stroke w/ cerebrovascular disease(HCC)    a. 05/2017 MRI/A: acute to subacute L pontine infarct. Chronic left thalamic lacunar infarct. Severe basilar artery stenosis w/ radiographic string sign of the distal 1/3. Mild to moderate L PCA P2 segment stenosis; b. 05/2017 Carotid U/S: <50% bilat ICA stenosis.  . Lumbago    Lumbosacral Neuritis  . Prostate cancer [facility])     Past Surgical History:  Procedure Laterality Date  . CARPAL TUNNEL RELEASE    . REPLACEMENT TOTAL KNEE  BILATERAL      There were no vitals filed for this visit.  Subjective Assessment - 04/04/18 1610    Subjective  Patient expressed sadness and frustration over not being able to talk like he wants to at the beginning of session.            ADULT SLP TREATMENT - 04/04/18 0001      General Information   Behavior/Cognition  Alert;Cooperative;Pleasant mood    Patient Positioning  Upright in chair    HPI   84 year old man S/P left pontine stroke 05/17/2017.  The patient received rehab services, including speech therapy, at a SNF (05/21/2017 - 06/22/2017) and via home health (06/22/2017 - 08/09/2017).  Past medical history includes vascular dementia.       Treatment Provided   Treatment provided  Cognitive-Linquistic      Cognitive-Linquistic Treatment   Treatment focused on  Aphasia;Apraxia    Skilled Treatment Patient identified pictures on a picture communication board FO:30 on iPad to answer mod complex wh- questions with 100% accuracy given min verbal and visual cues to scroll thru additional pictures.   Patient repeated initial /h/ monosyllabic words given visual cues only with 69% accuracy and with 100% accuracy given visual cues and contrastive minimal pairs of errored productions.  Patient repeated 2 word mono & bisyllabic functional phrases with 84% accuracy given visual cues for articulatory placement and pacing. Patient repeated target words/phrases 5x each. Noted decreased perseveration today producing previously targeted words.      Assessment / Recommendations / Plan   Plan  Continue with current plan of care      Progression Toward Goals   Progression toward goals  Progressing toward goals       SLP Education - 04/04/18 1611    Education provided  Yes    Education Details  Strategies to facilitate and supplement successful verbal and nonverbal expression.    Person(s) Educated  Patient    Methods  Explanation;Demonstration;Verbal cues    Comprehension  Need further  instruction;Returned demonstration;Verbal cues required         SLP Long Term Goals - 04/04/18 1612      SLP LONG TERM GOAL #1   Title  Patient will repeat words maintaining phonemic accuracy with 80% accuracy.    Time  16    Period  Weeks    Status  Revised    Target Date  06/03/18      SLP LONG TERM GOAL #2   Title  Patient will name objects, pictures, people, and/or activities verbally with 80% accuracy given min verbal, visual or tactile cues.    Time  16    Period  Weeks    Status  Partially Met    Target Date  06/03/18      SLP LONG TERM GOAL #3   Title  Patient will complete simple phrases/sentences with 80% accuracy given min verbal, visual or tactile cues.    Time  16    Status  On-going    Target Date  06/03/18      SLP LONG TERM GOAL #4   Title  Patient will use AAC to supplement verbal expression to communicate with familiar listeners with 80% intelligibility given min cues.    Time  16    Period  Weeks    Status  On-going    Target Date  06/03/18       Plan - 04/04/18 1612    Clinical Impression Statement Visual cues for articulatory placement and minimal pairs continue to be most successful in eliciting successful productions. Able to fade visual cues when repeating functional phrases this session.  Patient continues to make steady progress on treatment goals, Will submit re-cert for an additional 8 weeks of treatment to continue to facilitate patient independence producing functional words and phrases to successfully communicate needs/wants to familiar and unfamiliar listeners.   Speech Therapy Frequency  2x / week    Duration  Other (comment)   8 weeks   Treatment/Interventions  Language facilitation;Cueing hierarchy;SLP instruction and feedback;Multimodal communcation approach;Patient/family education    Potential Considerations  Ability to learn/carryover information;Pain level;Family/community support;Co-morbidities;Previous level of  function;Cooperation/participation level;Severity of impairments;Medical prognosis       Patient will benefit from skilled therapeutic intervention in order to improve the following deficits and impairments:   Apraxia  Aphasia    Problem List Patient Active Problem List   Diagnosis Date Noted  . CHF (congestive heart failure) ([place]) 01/09/2018  . Iron deficiency anemia due to chronic blood loss 12/20/2017  . Advanced care planning/counseling discussion 12/20/2017  . Hypoxia 11/02/2017  . Mixed hyperlipidemia 09/21/2017  . Aphasia 05/18/2017  . CAD (coronary artery disease) 05/18/2017  . History of stroke with residual deficit 05/18/2017  . 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 Knee joint replaced by other means 07/04/2015  . Urinary incontinence 04/27/2015  . Anxiety, generalized 04/07/2015  . History of BCG vaccination 04/07/2015  . GERD (gastroesophageal reflux disease) 03/04/2015  . Depression 02/25/2015  . Degeneration of intervertebral disc of lumbar region 02/23/2015  . Neuritis or radiculitis due to rupture of lumbar intervertebral disc 02/23/2015  . Lumbar stenosis with neurogenic claudication 02/23/2015  . Hypertension 12/02/2014  . Diabetes mellitus without complication ([place]) 95/28/4132  . Benign essential tremor 03/13/2014  . Imbalance 03/13/2014  . Malignant neoplasm of prostate ([place]) 10/07/2012  . ED (erectile dysfunction) of organic origin 02/09/2012    Mare Loan, CCC-SLP 04/04/2018, 4:16 PM  [place] MAIN [facility] SERVICES [address] [place], [place], [address] Phone: [phone]   Fax:  [phone]   Name: Todd [name] MRN: [id number] Date of Birth: [birth year]

## 2018-04-11 ENCOUNTER — Encounter: Payer: Self-pay | Admitting: Speech Pathology

## 2018-04-11 ENCOUNTER — Ambulatory Visit: Payer: PPO | Attending: Family Medicine | Admitting: Speech Pathology

## 2018-04-11 DIAGNOSIS — R0902 Hypoxemia: Secondary | ICD-10-CM | POA: Diagnosis not present

## 2018-04-11 DIAGNOSIS — R4701 Aphasia: Secondary | ICD-10-CM

## 2018-04-11 DIAGNOSIS — R482 Apraxia: Secondary | ICD-10-CM | POA: Insufficient documentation

## 2018-04-11 DIAGNOSIS — I2510 Atherosclerotic heart disease of native coronary artery without angina pectoris: Secondary | ICD-10-CM | POA: Diagnosis not present

## 2018-04-11 DIAGNOSIS — I501 Left ventricular failure: Secondary | ICD-10-CM | POA: Diagnosis not present

## 2018-04-11 NOTE — Therapy (Signed)
[place] MAIN [facility] SERVICES [address] [place], [place], [address] Phone: [phone]   Fax:  [phone]  Speech Language Pathology Treatment  Patient Details  Name: Todd [name] MRN: [id number] Date of Birth: 01-Aug-1930 Referring Provider (SLP): Merrie Roof, [place]   Encounter Date: 04/11/2018  End of Session - 04/11/18 1607    Visit Number  14    Number of Visits  17    Date for SLP Re-Evaluation  04/08/18    Authorization Type  3/10 medicare progress note    SLP Start Time  1458    SLP Stop Time   1554    SLP Time Calculation (min)  56 min    Activity Tolerance  Patient tolerated treatment well       Past Medical History:  Diagnosis Date  . Anxiety   . Aortic regurgitation    a. 03/2017 Echo: Mod AI; b. 05/2017 Echo: Triv AI.  [name] Kitchen Arthritis    osteoarthritis-left knee  . CAD (coronary artery disease)    a. Reported h/o cath ~ 2008 - ? small vessel dzs. No PCI performed; b. 03/2017 MV: EF 45-54%, no ischemia/infarct.  . Depression   . Diabetes mellitus without complication ([place])   . Diastolic dysfunction    a. 03/2017 Echo: EF 55-65%; b. 05/2017 Echo: EF 55-60%, no rwma, Gr1 DD. Triv AI.  Mildly dil LA.  . ED (erectile dysfunction)   . Elevated troponin    a. chronic mild elevation - 0.03->0.04.  [name] Kitchen GERD (gastroesophageal reflux disease)   . Glaucoma   . Hyperlipidemia   . Hypertension   . Iron deficiency anemia due to chronic blood loss   . Left pontine stroke w/ cerebrovascular disease(HCC)    a. 05/2017 MRI/A: acute to subacute L pontine infarct. Chronic left thalamic lacunar infarct. Severe basilar artery stenosis w/ radiographic string sign of the distal 1/3. Mild to moderate L PCA P2 segment stenosis; b. 05/2017 Carotid U/S: <50% bilat ICA stenosis.  . Lumbago    Lumbosacral Neuritis  . Prostate cancer [facility])     Past Surgical History:  Procedure Laterality Date  . CARPAL TUNNEL RELEASE    . REPLACEMENT TOTAL KNEE  BILATERAL      There were no vitals filed for this visit.  Subjective Assessment - 04/11/18 1605    Subjective  Patient reported he is doing well today.    Currently in Pain?  No/denies            ADULT SLP TREATMENT - 04/11/18 0001      General Information   Behavior/Cognition  Alert;Cooperative;Pleasant mood    Patient Positioning  Upright in chair    HPI   84 year old man S/P left pontine stroke 05/17/2017.  The patient received rehab services, including speech therapy, at a SNF (05/21/2017 - 06/22/2017) and via home health (06/22/2017 - 08/09/2017).  Past medical history includes vascular dementia.       Treatment Provided   Treatment provided  Cognitive-Linquistic      Cognitive-Linquistic Treatment   Treatment focused on  Aphasia;Apraxia    Skilled Treatment  Patient named pictured objects and animals with 61% accuracy independently and repeated errored productions with 96% accuracy given consistent visual cues for articulatory placement. Patient repeated corrections of target words 5x each.  Patient repeated initial /h/ monosyllabic words given visual cues only with 80% accuracy and with 100% accuracy given visual cues and contrastive minimal pairs of errored productions. Patient repeated 2 word  mono & bisyllabic functional phrases with 90% accuracy given visual cues for articulatory placement and pacing. Patient repeated target words/phrases 5x each.      Assessment / Recommendations / Plan   Plan  Continue with current plan of care      Progression Toward Goals   Progression toward goals  Progressing toward goals       SLP Education - 04/11/18 1606    Education provided  Yes    Education Details  Strategies to facilitate and supplement successful verbal and nonverbal expression.    Person(s) Educated  Patient    Methods  Explanation;Demonstration;Verbal cues    Comprehension  Returned demonstration;Need further instruction;Verbal cues required;Other (comment)    visual cues        SLP Long Term Goals - 04/04/18 1612      SLP LONG TERM GOAL #1   Title  Patient will repeat words maintaining phonemic accuracy with 80% accuracy.    Time  16    Period  Weeks    Status  Revised    Target Date  06/03/18      SLP LONG TERM GOAL #2   Title  Patient will name objects, pictures, people, and/or activities verbally with 80% accuracy given min verbal, visual or tactile cues.    Time  16    Period  Weeks    Status  Partially Met    Target Date  06/03/18      SLP LONG TERM GOAL #3   Title  Patient will complete simple phrases/sentences with 80% accuracy given min verbal, visual or tactile cues.    Time  16    Status  On-going    Target Date  06/03/18      SLP LONG TERM GOAL #4   Title  Patient will use AAC to supplement verbal expression to communicate with familiar listeners with 80% intelligibility given min cues.    Time  16    Period  Weeks    Status  On-going    Target Date  06/03/18       Plan - 04/11/18 1608    Clinical Impression Statement  Patient continues to require consistent visual cues to ensure articulatory accuracy repeating 2 word utterances. Noted improved independence repeating initial /h/ monosyllabic words with decreased perseveration noted today. Patient's daughter in law reported she will email SLP with a list of needs and wants for development of a manual communication board to supplement & clarify verbal expression.   Speech Therapy Frequency  2x / week    Duration  Other (comment)   8 weeks   Treatment/Interventions  Language facilitation;Cueing hierarchy;SLP instruction and feedback;Multimodal communcation approach;Patient/family education    Potential to Achieve Goals  Fair    Potential Considerations  Ability to learn/carryover information;Pain level;Family/community support;Co-morbidities;Previous level of function;Cooperation/participation level;Severity of impairments;Medical prognosis    SLP Home Exercise Plan   family to email a list of common wants and needs to assist SLP in constructing picture communication board to supplement and clarify verbal communication    Consulted and Agree with Plan of Care  Patient;Family member/caregiver    Family Member Consulted  [name] and husband       Patient will benefit from skilled therapeutic intervention in order to improve the following deficits and impairments:   Apraxia  Aphasia    Problem List Patient Active Problem List   Diagnosis Date Noted  . CHF (congestive heart failure) ([place]) 01/09/2018  . Iron deficiency anemia due to chronic  blood loss 12/20/2017  . Advanced care planning/counseling discussion 12/20/2017  . Hypoxia 11/02/2017  . Mixed hyperlipidemia 09/21/2017  . Aphasia 05/18/2017  . CAD (coronary artery disease) 05/18/2017  . History of stroke with residual deficit 05/18/2017  . 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 Knee joint replaced by other means 07/04/2015  . Urinary incontinence 04/27/2015  . Anxiety, generalized 04/07/2015  . History of BCG vaccination 04/07/2015  . GERD (gastroesophageal reflux disease) 03/04/2015  . Depression 02/25/2015  . Degeneration of intervertebral disc of lumbar region 02/23/2015  . Neuritis or radiculitis due to rupture of lumbar intervertebral disc 02/23/2015  . Lumbar stenosis with neurogenic claudication 02/23/2015  . Hypertension 12/02/2014  . Diabetes mellitus without complication ([place]) 65/78/4696  . Benign essential tremor 03/13/2014  . Imbalance 03/13/2014  . Malignant neoplasm of prostate ([place]) 10/07/2012  . ED (erectile dysfunction) of organic origin 02/09/2012    Lovell Roe, MA, CCC-SLP 04/11/2018, 4:18 PM  [place] MAIN [facility] SERVICES [address] [place], [place], [address] Phone: [phone]   Fax:  [phone]   Name: Todd [name] MRN: [id number] Date of Birth: November 04, 1930

## 2018-04-12 ENCOUNTER — Ambulatory Visit (INDEPENDENT_AMBULATORY_CARE_PROVIDER_SITE_OTHER): Payer: PPO | Admitting: Urology

## 2018-04-12 ENCOUNTER — Encounter: Payer: Self-pay | Admitting: Urology

## 2018-04-12 ENCOUNTER — Ambulatory Visit: Payer: Self-pay

## 2018-04-12 ENCOUNTER — Other Ambulatory Visit: Payer: Self-pay

## 2018-04-12 DIAGNOSIS — C61 Malignant neoplasm of prostate: Secondary | ICD-10-CM | POA: Diagnosis not present

## 2018-04-12 NOTE — Progress Notes (Signed)
04/12/2018 7:50 PM   Todd [name] 02/12/1931 [id number]  Referring provider: Guadalupe Maple, MD [address] [place], [place] [address]  Chief Complaint  Patient presents with  . Prostate Cancer    HPI: 84 year old male with history of prostate cancer (unknown grade/stage) treated with radiation therapy and subsequent biochemical recurrence.  He last saw Dr. Junious Silk in May 2019.  He is on ADT and received a Lupron injection on 10/08/2017.  He presents today with his daughter.  She states there has been no significant changes since his last visit 6 months ago.  He has urinary urgency with occasional episodes of urge incontinence which is stable.   PMH: Past Medical History:  Diagnosis Date  . Anxiety   . Aortic regurgitation    a. 03/2017 Echo: Mod AI; b. 05/2017 Echo: Triv AI.  [name] Kitchen Arthritis    osteoarthritis-left knee  . CAD (coronary artery disease)    a. Reported h/o cath ~ 2008 - ? small vessel dzs. No PCI performed; b. 03/2017 MV: EF 45-54%, no ischemia/infarct.  . Depression   . Diabetes mellitus without complication ([place])   . Diastolic dysfunction    a. 03/2017 Echo: EF 55-65%; b. 05/2017 Echo: EF 55-60%, no rwma, Gr1 DD. Triv AI.  Mildly dil LA.  . ED (erectile dysfunction)   . Elevated troponin    a. chronic mild elevation - 0.03->0.04.  [name] Kitchen GERD (gastroesophageal reflux disease)   . Glaucoma   . Hyperlipidemia   . Hypertension   . Iron deficiency anemia due to chronic blood loss   . Left pontine stroke w/ cerebrovascular disease(HCC)    a. 05/2017 MRI/A: acute to subacute L pontine infarct. Chronic left thalamic lacunar infarct. Severe basilar artery stenosis w/ radiographic string sign of the distal 1/3. Mild to moderate L PCA P2 segment stenosis; b. 05/2017 Carotid U/S: <50% bilat ICA stenosis.  . Lumbago    Lumbosacral Neuritis  . Prostate cancer [facility])     Surgical History: Past Surgical History:  Procedure Laterality Date  . CARPAL TUNNEL RELEASE    .  REPLACEMENT TOTAL KNEE BILATERAL      Home Medications:  Allergies as of 04/12/2018      Reactions   Amlodipine    Lower extremity swelling at 10 mg.   Fluoxetine Other (See Comments)   headache   Meloxicam Other (See Comments)   Other reaction(s): Dizziness Nervousness.      Medication List        Accurate as of 04/12/18  7:50 PM. Always use your most recent med list.          ACCU-CHEK AVIVA PLUS w/Device Kit Use to check sugar levels x 2 daily   accu-chek soft touch lancets Use as instructed   aspirin EC 81 MG tablet Take 81 mg by mouth daily.   atorvastatin 10 MG tablet Commonly known as:  LIPITOR Take 1 tablet (10 mg total) by mouth daily.   clopidogrel 75 MG tablet Commonly known as:  PLAVIX Take 1 tablet (75 mg total) by mouth daily.   DULoxetine 20 MG capsule Commonly known as:  CYMBALTA   gabapentin 100 MG capsule Commonly known as:  NEURONTIN Take 100 mg by mouth 2 (two) times daily. Once a day at night   hydrALAZINE 25 MG tablet Commonly known as:  APRESOLINE Take 1 tablet (25 mg total) by mouth 3 (three) times daily.   hydrochlorothiazide 25 MG tablet Commonly known as:  HYDRODIURIL Take 1 tablet (25  mg total) by mouth daily.   latanoprost 0.005 % ophthalmic solution Commonly known as:  XALATAN Place 1 drop into both eyes at bedtime.   LORazepam 0.5 MG tablet Commonly known as:  ATIVAN Take 1 tablet (0.5 mg total) by mouth 2 (two) times daily as needed for anxiety.   metFORMIN 500 MG tablet Commonly known as:  GLUCOPHAGE Take 1 tablet (500 mg total) by mouth daily with breakfast.   naproxen sodium 220 MG tablet Commonly known as:  ALEVE Take 220 mg by mouth 2 (two) times daily as needed.   pantoprazole 40 MG tablet Commonly known as:  PROTONIX Take 1 tablet (40 mg total) by mouth daily.   propranolol ER 80 MG 24 hr capsule Commonly known as:  INDERAL LA Take 1 capsule (80 mg total) by mouth daily.   telmisartan 80 MG  tablet Commonly known as:  MICARDIS Take 1 tablet (80 mg total) by mouth daily.   TYLENOL 8 HOUR ARTHRITIS PAIN 650 MG CR tablet Generic drug:  acetaminophen Take 650 mg by mouth every 8 (eight) hours as needed for pain.       Allergies:  Allergies  Allergen Reactions  . Amlodipine     Lower extremity swelling at 10 mg.  . Fluoxetine Other (See Comments)    headache  . Meloxicam Other (See Comments)    Other reaction(s): Dizziness Nervousness.     Family History: Family History  Problem Relation Age of Onset  . Stroke Mother   . Hypertension Mother   . Hypertension Father   . Stroke Sister   . Prostate cancer Neg Hx   . Kidney cancer Neg Hx   . Bladder Cancer Neg Hx     Social History:  reports that he quit smoking about 23 years ago. He has never used smokeless tobacco. He reports that he drinks about 1.0 standard drinks of alcohol per week. He reports that he does not use drugs.  ROS: UROLOGY Frequent Urination?: No Hard to postpone urination?: No Burning/pain with urination?: No Get up at night to urinate?: No Leakage of urine?: No Urine stream starts and stops?: No Trouble starting stream?: No Do you have to strain to urinate?: No Blood in urine?: No Urinary tract infection?: No Sexually transmitted disease?: No Injury to kidneys or bladder?: No Painful intercourse?: No Weak stream?: No Erection problems?: No Penile pain?: No  Gastrointestinal Nausea?: No Vomiting?: No Indigestion/heartburn?: No Diarrhea?: No Constipation?: No  Constitutional Fever: No Night sweats?: No Weight loss?: No Fatigue?: No  Skin Skin rash/lesions?: No Itching?: No  Eyes Blurred vision?: No Double vision?: No  Ears/Nose/Throat Sore throat?: No Sinus problems?: No  Hematologic/Lymphatic Swollen glands?: No Easy bruising?: No  Cardiovascular Leg swelling?: No Chest pain?: No  Respiratory Cough?: No Shortness of breath?: Yes  Endocrine Excessive  thirst?: No  Musculoskeletal Back pain?: Yes Joint pain?: Yes  Neurological Headaches?: No Dizziness?: No  Psychologic Depression?: No Anxiety?: No  Physical Exam: BP 128/66 (BP Location: Left Arm, Patient Position: Sitting, Cuff Size: Normal)   Pulse 73   Ht 5' 7"  (1.702 m)   Wt 182 lb (82.6 kg)   BMI 28.51 kg/m   Constitutional:  Alert, No acute distress. HEENT: [place] AT, moist mucus membranes.  Trachea midline, no masses. Cardiovascular: No clubbing, cyanosis, or edema. Respiratory: Normal respiratory effort, no increased work of breathing.  Assessment & Plan:    1. Prostate cancer [facility]) Prior radiation treatment with biochemical recurrence.  PSA and testosterone were  drawn today.  He received a 68-monthLupron.   Return in about 6 months (around 10/11/2018) for Recheck, PSA, T level, Lupron.  SAbbie Sons [place] [place] [phone]

## 2018-04-12 NOTE — Progress Notes (Signed)
Lupron IM Injection   Due to Prostate Cancer patient is present today for a Lupron Injection.  Medication: Lupron 6 month Dose: 45 mg  Location: right upper outer buttocks Lot: [id number] Exp: 06/23/2020  Patient tolerated well, no complications were noted  Performed by: Gordy Clement, CMA ([place])  Follow up: As scheduled

## 2018-04-13 LAB — TESTOSTERONE: Testosterone: 7 ng/dL — ABNORMAL LOW (ref 264–916)

## 2018-04-13 LAB — PSA: Prostate Specific Ag, Serum: 2.1 ng/mL (ref 0.0–4.0)

## 2018-04-15 ENCOUNTER — Ambulatory Visit: Payer: PPO | Admitting: Speech Pathology

## 2018-04-15 ENCOUNTER — Encounter: Payer: Self-pay | Admitting: Speech Pathology

## 2018-04-15 ENCOUNTER — Encounter: Payer: Self-pay | Admitting: Urology

## 2018-04-15 DIAGNOSIS — R4701 Aphasia: Secondary | ICD-10-CM

## 2018-04-15 DIAGNOSIS — R482 Apraxia: Secondary | ICD-10-CM

## 2018-04-15 NOTE — Therapy (Signed)
[place] MAIN [facility] SERVICES [address] [place], [place], [address] Phone: [phone]   Fax:  [phone]  Speech Language Pathology Treatment  Patient Details  Name: Todd [name] MRN: [id number] Date of Birth: [birth year] Referring Provider (SLP): Merrie Roof, [place]   Encounter Date: 04/15/2018  End of Session - 04/15/18 1621    Visit Number  15    Number of Visits  33    Date for SLP Re-Evaluation  06/03/18    Authorization Type  4/10 medicare progress note    SLP Start Time  1503    SLP Stop Time   1610    SLP Time Calculation (min)  67 min    Activity Tolerance  Patient tolerated treatment well       Past Medical History:  Diagnosis Date  . Anxiety   . Aortic regurgitation    a. 03/2017 Echo: Mod AI; b. 05/2017 Echo: Triv AI.  [name] Kitchen Arthritis    osteoarthritis-left knee  . CAD (coronary artery disease)    a. Reported h/o cath ~ 2008 - ? small vessel dzs. No PCI performed; b. 03/2017 MV: EF 45-54%, no ischemia/infarct.  . Depression   . Diabetes mellitus without complication ([place])   . Diastolic dysfunction    a. 03/2017 Echo: EF 55-65%; b. 05/2017 Echo: EF 55-60%, no rwma, Gr1 DD. Triv AI.  Mildly dil LA.  . ED (erectile dysfunction)   . Elevated troponin    a. chronic mild elevation - 0.03->0.04.  [name] Kitchen GERD (gastroesophageal reflux disease)   . Glaucoma   . Hyperlipidemia   . Hypertension   . Iron deficiency anemia due to chronic blood loss   . Left pontine stroke w/ cerebrovascular disease(HCC)    a. 05/2017 MRI/A: acute to subacute L pontine infarct. Chronic left thalamic lacunar infarct. Severe basilar artery stenosis w/ radiographic string sign of the distal 1/3. Mild to moderate L PCA P2 segment stenosis; b. 05/2017 Carotid U/S: <50% bilat ICA stenosis.  . Lumbago    Lumbosacral Neuritis  . Prostate cancer [facility])     Past Surgical History:  Procedure Laterality Date  . CARPAL TUNNEL RELEASE    . REPLACEMENT TOTAL KNEE  BILATERAL      Vitals:   04/15/18 1619  SpO2: 95%    Subjective Assessment - 04/15/18 1617    Subjective  Patient was increasingly frustrated his words are not coming out like he wants them to, shaking his head saying "i can't talk"    Currently in Pain?  No/denies            ADULT SLP TREATMENT - 04/15/18 0001      General Information   Behavior/Cognition  Alert;Cooperative;Pleasant mood    Patient Positioning  Upright in chair    HPI   84 year old man S/P left pontine stroke 05/17/2017.  The patient received rehab services, including speech therapy, at a SNF (05/21/2017 - 06/22/2017) and via home health (06/22/2017 - 08/09/2017).  Past medical history includes vascular dementia.       Treatment Provided   Treatment provided  Cognitive-Linquistic      Cognitive-Linquistic Treatment   Treatment focused on  Aphasia;Apraxia    Skilled Treatment  Patient named pictured objects, animals, and actions with 90% accuracy independently and repeated errored productions with 100% accuracy given consistent visual cues for articulatory placement.  Patient repeated initial /h/ monosyllabic words given visual cues only with 90% accuracy and with 100% accuracy given visual cues and  contrastive minimal pairs of errored productions. Patient repeated 2 word mono & bisyllabic functional phrases with 60% accuracy given visual cues for articulatory placement and pacing. Patient repeated target words/phrases 5x each. Increased perseveration and difficulty repeated 2 words in succession. Often accurate with single word repetition, unable to repeat corrections of errored productions given max cues.     Assessment / Recommendations / Plan   Plan  Continue with current plan of care      Progression Toward Goals   Progression toward goals  Progressing toward goals       SLP Education - 04/15/18 1620    Education provided  Yes    Education Details  Strategies to facilitate and supplement successful  verbal and nonverbal expression    Person(s) Educated  Patient    Methods  Explanation;Demonstration;Verbal cues    Comprehension  Need further instruction;Returned demonstration;Verbal cues required         SLP Long Term Goals - 04/04/18 1612      SLP LONG TERM GOAL #1   Title  Patient will repeat words maintaining phonemic accuracy with 80% accuracy.    Time  16    Period  Weeks    Status  Revised    Target Date  06/03/18      SLP LONG TERM GOAL #2   Title  Patient will name objects, pictures, people, and/or activities verbally with 80% accuracy given min verbal, visual or tactile cues.    Time  16    Period  Weeks    Status  Partially Met    Target Date  06/03/18      SLP LONG TERM GOAL #3   Title  Patient will complete simple phrases/sentences with 80% accuracy given min verbal, visual or tactile cues.    Time  16    Status  On-going    Target Date  06/03/18      SLP LONG TERM GOAL #4   Title  Patient will use AAC to supplement verbal expression to communicate with familiar listeners with 80% intelligibility given min cues.    Time  16    Period  Weeks    Status  On-going    Target Date  06/03/18       Plan - 04/15/18 1622    Clinical Impression Statement Patient demonstrated improved independence naming pictured objects and actions with articulatory accuracy. Patient required semi-consistent visual cues to ensure articulatory accuracy repeating 1 word utterances. Decreased accuracy repeating 2 word functional phrases given max cues. Took O2 sat's at beginning of treatment due to patient reported he felt he wasn't breathing well, family reported they forgot the nasal cannula for his portable O2 tank. O2 saturation level was measured and found to be WFL 94-96%. Patient encouraged to perform pursed lip breathing to aid O2 consumption. Patient reported he felt much better. Family continue to report they are compiling a list of items to be used in Architect of a Advertising copywriter.   Speech Therapy Frequency  2x / week    Duration  Other (comment)   8 weeks   Treatment/Interventions  Language facilitation;Cueing hierarchy;SLP instruction and feedback;Multimodal communcation approach;Patient/family education    Potential to Achieve Goals  Fair    Potential Considerations  Ability to learn/carryover information;Pain level;Family/community support;Co-morbidities;Previous level of function;Cooperation/participation level;Severity of impairments;Medical prognosis    SLP Home Exercise Plan  family to email a list of common wants and needs to assist SLP in constructing picture communication board  to supplement and clarify verbal communication    Consulted and Agree with Plan of Care  Patient;Family member/caregiver       Patient will benefit from skilled therapeutic intervention in order to improve the following deficits and impairments:   Apraxia  Aphasia    Problem List Patient Active Problem List   Diagnosis Date Noted  . CHF (congestive heart failure) ([place]) 01/09/2018  . Iron deficiency anemia due to chronic blood loss 12/20/2017  . Advanced care planning/counseling discussion 12/20/2017  . Hypoxia 11/02/2017  . Mixed hyperlipidemia 09/21/2017  . Aphasia 05/18/2017  . CAD (coronary artery disease) 05/18/2017  . History of stroke with residual deficit 05/18/2017  . 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 Knee joint replaced by other means 07/04/2015  . Urinary incontinence 04/27/2015  . Anxiety, generalized 04/07/2015  . History of BCG vaccination 04/07/2015  . GERD (gastroesophageal reflux disease) 03/04/2015  . Depression 02/25/2015  . Degeneration of intervertebral disc of lumbar region 02/23/2015  . Neuritis or radiculitis due to rupture of lumbar intervertebral disc 02/23/2015  . Lumbar stenosis with neurogenic claudication 02/23/2015  . Hypertension 12/02/2014  . Diabetes mellitus without complication ([place])  04/06/1116  . Benign essential tremor 03/13/2014  . Imbalance 03/13/2014  . Malignant neoplasm of prostate ([place]) 10/07/2012  . ED (erectile dysfunction) of organic origin 02/09/2012    Orville Widmann, MA, CCC-SLP 04/15/2018, 4:27 PM  [place] MAIN [facility] SERVICES [address] [place], [place], [address] Phone: [phone]   Fax:  [phone]   Name: Mohmed Farver MRN: [id number] Date of Birth: 26-Feb-1931

## 2018-04-16 ENCOUNTER — Telehealth: Payer: Self-pay

## 2018-04-16 NOTE — Telephone Encounter (Signed)
Called pt's daughter per DPR, no answer. LM for daughter informing her of the information below. Advised daughter to call back for questions or concerns.

## 2018-04-16 NOTE — Telephone Encounter (Signed)
-----   Message from Abbie Sons, MD sent at 04/15/2018  8:42 AM EST ----- PSA stable at 2.1

## 2018-04-20 ENCOUNTER — Encounter: Payer: Self-pay | Admitting: Emergency Medicine

## 2018-04-20 ENCOUNTER — Emergency Department: Payer: PPO

## 2018-04-20 ENCOUNTER — Other Ambulatory Visit: Payer: Self-pay

## 2018-04-20 ENCOUNTER — Other Ambulatory Visit: Payer: Self-pay | Admitting: Cardiovascular Disease

## 2018-04-20 ENCOUNTER — Inpatient Hospital Stay
Admission: EM | Admit: 2018-04-20 | Discharge: 2018-04-23 | DRG: 056 | Disposition: A | Discharge status: Home Health | Payer: PPO | Attending: Internal Medicine | Admitting: Internal Medicine

## 2018-04-20 DIAGNOSIS — R531 Weakness: Secondary | ICD-10-CM

## 2018-04-20 DIAGNOSIS — R471 Dysarthria and anarthria: Secondary | ICD-10-CM | POA: Diagnosis not present

## 2018-04-20 DIAGNOSIS — F329 Major depressive disorder, single episode, unspecified: Secondary | ICD-10-CM | POA: Diagnosis present

## 2018-04-20 DIAGNOSIS — I2510 Atherosclerotic heart disease of native coronary artery without angina pectoris: Secondary | ICD-10-CM | POA: Diagnosis not present

## 2018-04-20 DIAGNOSIS — Z8249 Family history of ischemic heart disease and other diseases of the circulatory system: Secondary | ICD-10-CM

## 2018-04-20 DIAGNOSIS — Z886 Allergy status to analgesic agent status: Secondary | ICD-10-CM

## 2018-04-20 DIAGNOSIS — Z96653 Presence of artificial knee joint, bilateral: Secondary | ICD-10-CM | POA: Diagnosis not present

## 2018-04-20 DIAGNOSIS — D500 Iron deficiency anemia secondary to blood loss (chronic): Secondary | ICD-10-CM | POA: Diagnosis present

## 2018-04-20 DIAGNOSIS — I351 Nonrheumatic aortic (valve) insufficiency: Secondary | ICD-10-CM | POA: Diagnosis not present

## 2018-04-20 DIAGNOSIS — E782 Mixed hyperlipidemia: Secondary | ICD-10-CM | POA: Diagnosis not present

## 2018-04-20 DIAGNOSIS — M6281 Muscle weakness (generalized): Secondary | ICD-10-CM | POA: Diagnosis not present

## 2018-04-20 DIAGNOSIS — F411 Generalized anxiety disorder: Secondary | ICD-10-CM | POA: Diagnosis present

## 2018-04-20 DIAGNOSIS — R4702 Dysphasia: Secondary | ICD-10-CM | POA: Diagnosis not present

## 2018-04-20 DIAGNOSIS — Z79899 Other long term (current) drug therapy: Secondary | ICD-10-CM

## 2018-04-20 DIAGNOSIS — J9601 Acute respiratory failure with hypoxia: Secondary | ICD-10-CM | POA: Diagnosis not present

## 2018-04-20 DIAGNOSIS — I248 Other forms of acute ischemic heart disease: Secondary | ICD-10-CM | POA: Diagnosis present

## 2018-04-20 DIAGNOSIS — H409 Unspecified glaucoma: Secondary | ICD-10-CM | POA: Diagnosis present

## 2018-04-20 DIAGNOSIS — E1165 Type 2 diabetes mellitus with hyperglycemia: Secondary | ICD-10-CM | POA: Diagnosis present

## 2018-04-20 DIAGNOSIS — M545 Low back pain: Secondary | ICD-10-CM | POA: Diagnosis present

## 2018-04-20 DIAGNOSIS — J9600 Acute respiratory failure, unspecified whether with hypoxia or hypercapnia: Secondary | ICD-10-CM | POA: Diagnosis present

## 2018-04-20 DIAGNOSIS — I639 Cerebral infarction, unspecified: Secondary | ICD-10-CM | POA: Diagnosis not present

## 2018-04-20 DIAGNOSIS — Z823 Family history of stroke: Secondary | ICD-10-CM

## 2018-04-20 DIAGNOSIS — Z7902 Long term (current) use of antithrombotics/antiplatelets: Secondary | ICD-10-CM

## 2018-04-20 DIAGNOSIS — G8929 Other chronic pain: Secondary | ICD-10-CM | POA: Diagnosis not present

## 2018-04-20 DIAGNOSIS — Z7982 Long term (current) use of aspirin: Secondary | ICD-10-CM

## 2018-04-20 DIAGNOSIS — Z9981 Dependence on supplemental oxygen: Secondary | ICD-10-CM

## 2018-04-20 DIAGNOSIS — I5033 Acute on chronic diastolic (congestive) heart failure: Secondary | ICD-10-CM | POA: Diagnosis present

## 2018-04-20 DIAGNOSIS — Z888 Allergy status to other drugs, medicaments and biological substances status: Secondary | ICD-10-CM

## 2018-04-20 DIAGNOSIS — K219 Gastro-esophageal reflux disease without esophagitis: Secondary | ICD-10-CM | POA: Diagnosis present

## 2018-04-20 DIAGNOSIS — Z8546 Personal history of malignant neoplasm of prostate: Secondary | ICD-10-CM | POA: Diagnosis not present

## 2018-04-20 DIAGNOSIS — I69320 Aphasia following cerebral infarction: Secondary | ICD-10-CM | POA: Diagnosis not present

## 2018-04-20 DIAGNOSIS — Z87891 Personal history of nicotine dependence: Secondary | ICD-10-CM

## 2018-04-20 DIAGNOSIS — Z791 Long term (current) use of non-steroidal anti-inflammatories (NSAID): Secondary | ICD-10-CM

## 2018-04-20 DIAGNOSIS — D649 Anemia, unspecified: Secondary | ICD-10-CM | POA: Diagnosis not present

## 2018-04-20 DIAGNOSIS — R778 Other specified abnormalities of plasma proteins: Secondary | ICD-10-CM

## 2018-04-20 DIAGNOSIS — R7989 Other specified abnormal findings of blood chemistry: Secondary | ICD-10-CM

## 2018-04-20 DIAGNOSIS — J9621 Acute and chronic respiratory failure with hypoxia: Secondary | ICD-10-CM | POA: Diagnosis not present

## 2018-04-20 DIAGNOSIS — I5031 Acute diastolic (congestive) heart failure: Secondary | ICD-10-CM | POA: Diagnosis not present

## 2018-04-20 DIAGNOSIS — G250 Essential tremor: Secondary | ICD-10-CM | POA: Diagnosis present

## 2018-04-20 DIAGNOSIS — R0602 Shortness of breath: Secondary | ICD-10-CM | POA: Diagnosis not present

## 2018-04-20 DIAGNOSIS — I69354 Hemiplegia and hemiparesis following cerebral infarction affecting left non-dominant side: Secondary | ICD-10-CM | POA: Diagnosis not present

## 2018-04-20 DIAGNOSIS — I110 Hypertensive heart disease with heart failure: Secondary | ICD-10-CM | POA: Diagnosis present

## 2018-04-20 DIAGNOSIS — Z7984 Long term (current) use of oral hypoglycemic drugs: Secondary | ICD-10-CM

## 2018-04-20 DIAGNOSIS — J9811 Atelectasis: Secondary | ICD-10-CM | POA: Diagnosis not present

## 2018-04-20 DIAGNOSIS — R4020 Unspecified coma: Secondary | ICD-10-CM | POA: Diagnosis not present

## 2018-04-20 LAB — URINALYSIS, COMPLETE (UACMP) WITH MICROSCOPIC
Bacteria, UA: NONE SEEN
Bilirubin Urine: NEGATIVE
Glucose, UA: 150 mg/dL — AB
Hgb urine dipstick: NEGATIVE
Ketones, ur: NEGATIVE mg/dL
Leukocytes, UA: NEGATIVE
Nitrite: NEGATIVE
Protein, ur: NEGATIVE mg/dL
Specific Gravity, Urine: 1.016 (ref 1.005–1.030)
pH: 6 (ref 5.0–8.0)

## 2018-04-20 LAB — CBC WITH DIFFERENTIAL/PLATELET
Abs Immature Granulocytes: 0.09 10*3/uL — ABNORMAL HIGH (ref 0.00–0.07)
Basophils Absolute: 0.1 10*3/uL (ref 0.0–0.1)
Basophils Relative: 1 %
Eosinophils Absolute: 0.1 10*3/uL (ref 0.0–0.5)
Eosinophils Relative: 1 %
HCT: 33.9 % — ABNORMAL LOW (ref 39.0–52.0)
Hemoglobin: 10.4 g/dL — ABNORMAL LOW (ref 13.0–17.0)
Immature Granulocytes: 1 %
Lymphocytes Relative: 14 %
Lymphs Abs: 0.9 10*3/uL (ref 0.7–4.0)
MCH: 31 pg (ref 26.0–34.0)
MCHC: 30.7 g/dL (ref 30.0–36.0)
MCV: 100.9 fL — ABNORMAL HIGH (ref 80.0–100.0)
Monocytes Absolute: 0.6 10*3/uL (ref 0.1–1.0)
Monocytes Relative: 8 %
Neutro Abs: 5.1 10*3/uL (ref 1.7–7.7)
Neutrophils Relative %: 75 %
Platelets: 260 10*3/uL (ref 150–400)
RBC: 3.36 MIL/uL — ABNORMAL LOW (ref 4.22–5.81)
RDW: 12 % (ref 11.5–15.5)
WBC: 6.8 10*3/uL (ref 4.0–10.5)
nRBC: 0 % (ref 0.0–0.2)

## 2018-04-20 LAB — COMPREHENSIVE METABOLIC PANEL
ALT: 12 U/L (ref 0–44)
AST: 22 U/L (ref 15–41)
Albumin: 4 g/dL (ref 3.5–5.0)
Alkaline Phosphatase: 64 U/L (ref 38–126)
Anion gap: 10 (ref 5–15)
BUN: 20 mg/dL (ref 8–23)
CO2: 27 mmol/L (ref 22–32)
Calcium: 9.1 mg/dL (ref 8.9–10.3)
Chloride: 103 mmol/L (ref 98–111)
Creatinine, Ser: 0.93 mg/dL (ref 0.61–1.24)
GFR calc Af Amer: >60 mL/min (ref >60)
GFR calc non Af Amer: >60 mL/min (ref >60)
Glucose, Bld: 268 mg/dL — ABNORMAL HIGH (ref 70–99)
Potassium: 4.2 mmol/L (ref 3.5–5.1)
Sodium: 140 mmol/L (ref 135–145)
Total Bilirubin: 0.8 mg/dL (ref 0.3–1.2)
Total Protein: 7 g/dL (ref 6.5–8.1)

## 2018-04-20 LAB — TROPONIN I
Troponin I: 0.04 ng/mL (ref <0.03)
Troponin I: 0.05 ng/mL (ref <0.03)
Troponin I: 0.06 ng/mL (ref <0.03)

## 2018-04-20 LAB — BRAIN NATRIURETIC PEPTIDE: B Natriuretic Peptide: 332 pg/mL — ABNORMAL HIGH (ref 0.0–100.0)

## 2018-04-20 LAB — TSH
TSH: 0.517 u[IU]/mL (ref 0.350–4.500)
TSH: 0.535 u[IU]/mL (ref 0.350–4.500)

## 2018-04-20 MED ORDER — LORAZEPAM 0.5 MG PO TABS: 0.5000 mg | ORAL_TABLET | Freq: Two times a day (BID) | ORAL | Status: DC | PRN

## 2018-04-20 MED ORDER — FUROSEMIDE 40 MG PO TABS
20.0000 mg | ORAL_TABLET | Freq: Once | ORAL | Status: AC
  Administered 2018-04-20: 20 mg via ORAL
  Filled 2018-04-20: qty 1

## 2018-04-20 MED ORDER — ACETAMINOPHEN 650 MG RE SUPP: 650.0000 mg | Freq: Four times a day (QID) | RECTAL | Status: DC | PRN

## 2018-04-20 MED ORDER — FUROSEMIDE 10 MG/ML IJ SOLN
20.0000 mg | Freq: Every day | INTRAMUSCULAR | Status: DC
  Administered 2018-04-21 – 2018-04-22 (×2): 20 mg via INTRAVENOUS
  Filled 2018-04-20 (×2): qty 2

## 2018-04-20 MED ORDER — ACETAMINOPHEN 325 MG PO TABS: 650.0000 mg | ORAL_TABLET | Freq: Four times a day (QID) | ORAL | Status: DC | PRN

## 2018-04-20 MED ORDER — ATORVASTATIN CALCIUM 10 MG PO TABS
10.0000 mg | ORAL_TABLET | Freq: Every day | ORAL | Status: DC
  Administered 2018-04-21 – 2018-04-23 (×3): 10 mg via ORAL
  Filled 2018-04-20 (×3): qty 1

## 2018-04-20 MED ORDER — ASPIRIN EC 81 MG PO TBEC
81.0000 mg | DELAYED_RELEASE_TABLET | Freq: Every day | ORAL | Status: DC
  Administered 2018-04-21 – 2018-04-23 (×3): 81 mg via ORAL
  Filled 2018-04-20 (×3): qty 1

## 2018-04-20 MED ORDER — ONDANSETRON HCL 4 MG PO TABS: 4.0000 mg | ORAL_TABLET | Freq: Four times a day (QID) | ORAL | Status: DC | PRN

## 2018-04-20 MED ORDER — DULOXETINE HCL 20 MG PO CPEP
20.0000 mg | ORAL_CAPSULE | Freq: Every day | ORAL | Status: DC
  Administered 2018-04-21 – 2018-04-23 (×3): 20 mg via ORAL
  Filled 2018-04-20 (×3): qty 1

## 2018-04-20 MED ORDER — CLOPIDOGREL BISULFATE 75 MG PO TABS
75.0000 mg | ORAL_TABLET | Freq: Every day | ORAL | Status: DC
  Administered 2018-04-21 – 2018-04-23 (×3): 75 mg via ORAL
  Filled 2018-04-20 (×3): qty 1

## 2018-04-20 MED ORDER — HYDRALAZINE HCL 25 MG PO TABS
25.0000 mg | ORAL_TABLET | Freq: Three times a day (TID) | ORAL | Status: DC
  Administered 2018-04-21 – 2018-04-23 (×6): 25 mg via ORAL
  Filled 2018-04-20 (×10): qty 1

## 2018-04-20 MED ORDER — GABAPENTIN 100 MG PO CAPS
100.0000 mg | ORAL_CAPSULE | Freq: Two times a day (BID) | ORAL | Status: DC
  Administered 2018-04-20 – 2018-04-23 (×6): 100 mg via ORAL
  Filled 2018-04-20 (×6): qty 1

## 2018-04-20 MED ORDER — HYDROCHLOROTHIAZIDE 25 MG PO TABS
25.0000 mg | ORAL_TABLET | Freq: Every day | ORAL | Status: DC
  Administered 2018-04-21 – 2018-04-23 (×3): 25 mg via ORAL
  Filled 2018-04-20 (×3): qty 1

## 2018-04-20 MED ORDER — ENOXAPARIN SODIUM 40 MG/0.4ML ~~LOC~~ SOLN
40.0000 mg | SUBCUTANEOUS | Status: DC
  Administered 2018-04-20 – 2018-04-22 (×3): 40 mg via SUBCUTANEOUS
  Filled 2018-04-20 (×3): qty 0.4

## 2018-04-20 MED ORDER — PANTOPRAZOLE SODIUM 40 MG PO TBEC
40.0000 mg | DELAYED_RELEASE_TABLET | Freq: Every day | ORAL | Status: DC
  Administered 2018-04-21 – 2018-04-23 (×3): 40 mg via ORAL
  Filled 2018-04-20 (×3): qty 1

## 2018-04-20 MED ORDER — ONDANSETRON HCL 4 MG/2ML IJ SOLN: 4.0000 mg | Freq: Four times a day (QID) | INTRAMUSCULAR | Status: DC | PRN

## 2018-04-20 MED ORDER — LATANOPROST 0.005 % OP SOLN
1.0000 [drp] | Freq: Every day | OPHTHALMIC | Status: DC
  Administered 2018-04-20 – 2018-04-22 (×3): 1 [drp] via OPHTHALMIC
  Filled 2018-04-20: qty 2.5

## 2018-04-20 MED ORDER — METFORMIN HCL 500 MG PO TABS
500.0000 mg | ORAL_TABLET | Freq: Every day | ORAL | Status: DC
  Administered 2018-04-21 – 2018-04-23 (×3): 500 mg via ORAL
  Filled 2018-04-20 (×3): qty 1

## 2018-04-20 MED ORDER — IRBESARTAN 150 MG PO TABS
150.0000 mg | ORAL_TABLET | Freq: Every day | ORAL | Status: DC
  Administered 2018-04-21 – 2018-04-23 (×3): 150 mg via ORAL
  Filled 2018-04-20 (×3): qty 1

## 2018-04-20 MED ORDER — PROPRANOLOL HCL ER 80 MG PO CP24
80.0000 mg | ORAL_CAPSULE | Freq: Every day | ORAL | Status: DC
  Administered 2018-04-21 – 2018-04-23 (×3): 80 mg via ORAL
  Filled 2018-04-20 (×3): qty 1

## 2018-04-20 NOTE — ED Notes (Signed)
Date and time results received: 04/20/18 16:46 (use smartphrase ".now" to insert current time)  Test: Troponin Critical Value: 0.04  Name of Provider Notified: Dr. Jimmye Norman  Orders Received? Or Actions Taken?: Provider aware

## 2018-04-20 NOTE — ED Triage Notes (Signed)
Pt presents to ED via [facility] with c/o weakness from home. Per EMS pt on chronic 2L O2. Pt with 20g to L hand at this time, receiving NS bolus initiated by EMS PTA. Per EMS weakness started earlier today, pt is normally ambulatory with a walker and is able to perform most ADL's unassisted, today needed assistance with getting out of the bathroom. Per EMS Stroke Screen Negative, NSR.   CBG 287 HR 72 BP 120/62 O2: 99% on 2L

## 2018-04-20 NOTE — ED Provider Notes (Signed)
[facility] Emergency Department Provider Note       Time seen: ----------------------------------------- 3:50 PM on 04/20/2018 -----------------------------------------   I have reviewed the triage vital signs and the nursing notes.  HISTORY   Chief Complaint No chief complaint on file.    HPI Todd [name] is a 84 y.o. male with a history of anxiety, aortic regurgitation, arthritis, coronary artery disease, depression, diabetes, elevated troponin, hyperlipidemia and hypertension who presents to the ED for weakness.  Patient required more help than normal at home to ambulate and to get dressed.  Typically he uses a walker but was unable to walk at some point during the day.  He denies any complaints at this time.  Past Medical History:  Diagnosis Date  . Anxiety   . Aortic regurgitation    a. 03/2017 Echo: Mod AI; b. 05/2017 Echo: Triv AI.  [name] Kitchen Arthritis    osteoarthritis-left knee  . CAD (coronary artery disease)    a. Reported h/o cath ~ 2008 - ? small vessel dzs. No PCI performed; b. 03/2017 MV: EF 45-54%, no ischemia/infarct.  . Depression   . Diabetes mellitus without complication ([place])   . Diastolic dysfunction    a. 03/2017 Echo: EF 55-65%; b. 05/2017 Echo: EF 55-60%, no rwma, Gr1 DD. Triv AI.  Mildly dil LA.  . ED (erectile dysfunction)   . Elevated troponin    a. chronic mild elevation - 0.03->0.04.  [name] Kitchen GERD (gastroesophageal reflux disease)   . Glaucoma   . Hyperlipidemia   . Hypertension   . Iron deficiency anemia due to chronic blood loss   . Left pontine stroke w/ cerebrovascular disease(HCC)    a. 05/2017 MRI/A: acute to subacute L pontine infarct. Chronic left thalamic lacunar infarct. Severe basilar artery stenosis w/ radiographic string sign of the distal 1/3. Mild to moderate L PCA P2 segment stenosis; b. 05/2017 Carotid U/S: <50% bilat ICA stenosis.  . Lumbago    Lumbosacral Neuritis  . Prostate cancer [facility])     Patient Active  Problem List   Diagnosis Date Noted  . CHF (congestive heart failure) ([place]) 01/09/2018  . Iron deficiency anemia due to chronic blood loss 12/20/2017  . Advanced care planning/counseling discussion 12/20/2017  . Hypoxia 11/02/2017  . Mixed hyperlipidemia 09/21/2017  . Aphasia 05/18/2017  . CAD (coronary artery disease) 05/18/2017  . History of stroke with residual deficit 05/18/2017  . 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 Knee joint replaced by other means 07/04/2015  . Urinary incontinence 04/27/2015  . Anxiety, generalized 04/07/2015  . History of BCG vaccination 04/07/2015  . GERD (gastroesophageal reflux disease) 03/04/2015  . Depression 02/25/2015  . Degeneration of intervertebral disc of lumbar region 02/23/2015  . Neuritis or radiculitis due to rupture of lumbar intervertebral disc 02/23/2015  . Lumbar stenosis with neurogenic claudication 02/23/2015  . Hypertension 12/02/2014  . Diabetes mellitus without complication ([place]) 53/29/9242  . Benign essential tremor 03/13/2014  . Imbalance 03/13/2014  . Prostate cancer ([place]) 10/07/2012  . ED (erectile dysfunction) of organic origin 02/09/2012    Past Surgical History:  Procedure Laterality Date  . CARPAL TUNNEL RELEASE    . REPLACEMENT TOTAL KNEE BILATERAL      Allergies Amlodipine; Fluoxetine; and Meloxicam  Social History Social History   Tobacco Use  . Smoking status: Former Smoker    Last attempt to quit: 12/02/1994    Years since quitting: 23.3  . Smokeless tobacco: Never Used  Substance Use Topics  . Alcohol  use: Yes    Alcohol/week: 1.0 standard drinks    Types: 1 Cans of beer per week  . Drug use: No   Review of Systems Constitutional: Negative for fever. Cardiovascular: Negative for chest pain. Respiratory: Negative for shortness of breath. Gastrointestinal: Negative for abdominal pain, vomiting and diarrhea. Musculoskeletal: Negative for back pain. Skin: Negative for  rash. Neurological: Positive for generalized weakness  All systems negative/normal/unremarkable except as stated in the HPI  ____________________________________________   PHYSICAL EXAM:  VITAL SIGNS: ED Triage Vitals  Enc Vitals Group     BP      Pulse      Resp      Temp      Temp src      SpO2      Weight      Height      Head Circumference      Peak Flow      Pain Score      Pain Loc      Pain Edu?      Excl. in [place]?    Constitutional: Alert and oriented. Well appearing and in no distress. Eyes: Conjunctivae are pale.  Normal extraocular movements. ENT   Head: Normocephalic and atraumatic.   Nose: No congestion/rhinnorhea.   Mouth/Throat: Mucous membranes are moist.   Neck: No stridor. Cardiovascular: Normal rate, regular rhythm. No murmurs, rubs, or gallops. Respiratory: Normal respiratory effort without tachypnea nor retractions. Breath sounds are clear and equal bilaterally. No wheezes/rales/rhonchi. Gastrointestinal: Soft and nontender. Normal bowel sounds Musculoskeletal: Nontender with normal range of motion in extremities. No lower extremity tenderness nor edema. Neurologic:  Normal speech and language. No gross focal neurologic deficits are appreciated.  Generalized weakness, nothing focal Skin:  Skin is warm, dry and intact. No rash noted. Psychiatric: Mood and affect are normal. ____________________________________________  EKG: Interpreted by me.  Sinus rhythm rate 77 bpm, PVCs, left axis deviation, normal QT.  ____________________________________________  ED COURSE:  As part of my medical decision making, I reviewed the following data within the [place] History obtained from family if available, nursing notes, old chart and ekg, as well as notes from prior ED visits. Patient presented for weakness, we will assess with labs and imaging as indicated at this time.    Procedures ____________________________________________   LABS (pertinent positives/negatives)  Labs Reviewed  CBC WITH DIFFERENTIAL/PLATELET - Abnormal; Notable for the following components:      Result Value   RBC 3.36 (*)    Hemoglobin 10.4 (*)    HCT 33.9 (*)    MCV 100.9 (*)    Abs Immature Granulocytes 0.09 (*)    All other components within normal limits  COMPREHENSIVE METABOLIC PANEL - Abnormal; Notable for the following components:   Glucose, Bld 268 (*)    All other components within normal limits  TROPONIN I - Abnormal; Notable for the following components:   Troponin I 0.04 (*)    All other components within normal limits  URINALYSIS, COMPLETE (UACMP) WITH MICROSCOPIC    RADIOLOGY Images were viewed by me  CT head, chest x-ray IMPRESSION: No evidence of acute intracranial abnormality.  Small vessel ischemic changes. IMPRESSION: Cardiomegaly with vascular congestion.  Bibasilar atelectasis. ____________________________________________  DIFFERENTIAL DIAGNOSIS   Dehydration, electrolyte abnormality, occult infection, CVA, anemia  FINAL ASSESSMENT AND PLAN  Weakness, elevated troponin   Plan: The patient had presented for generalized weakness. Patient's labs are unremarkable with exception of an increasing troponin from 0.04-0.05.  Typically his  last troponin was 0.03, he has a chronic anemia and other than a slightly elevated glucose his labs are at his baseline. Patient's CT head was unremarkable but his chest x-ray did reveal some vascular congestion.  I did give him 20 mg of oral Lasix.  Overall he is profoundly weak and cannot walk without extensive assistance.  I will discuss with the hospitalist for admission.   Laurence Aly, MD   Note: This note was generated in part or whole with voice recognition software. Voice recognition is usually quite accurate but there are transcription errors that can and very often do occur. I apologize for  any typographical errors that were not detected and corrected.     Earleen Newport, MD 04/20/18 1910

## 2018-04-20 NOTE — H&P (Signed)
[facility] NAME: Todd [name]    MR#:  [id number]  DATE OF BIRTH:  20-Feb-1931  DATE OF ADMISSION:  04/20/2018  PRIMARY CARE PHYSICIAN: Guadalupe Maple, MD   REQUESTING/REFERRING PHYSICIAN: Dr. Lenise Arena  CHIEF COMPLAINT:   Chief Complaint  Patient presents with  . Weakness    HISTORY OF PRESENT ILLNESS:  Todd [name]  is a 84 y.o. male with a known history of stroke, with minimal left-sided weakness and expressive aphasia, CAD, arthritis, diabetes, GERD, hypertension, iron deficiency anemia presents from home secondary to significant weakness this morning. Due to his expressive aphasia, patient is unable to provide information.  Most of the history is obtained from his daughters at bedside.  According to them, patient has been complaining of weakness on and off and was noted to have iron deficiency anemia and is started on supplements.  He was doing well, ambulating with a walker at baseline but today unable to get out of bed.  Denies any chest pain, complains of some dyspnea.  Chronically on 3 L oxygen.  Has chronic low back pain, no abdominal pain today, no nausea or vomiting.  Denies any fevers or chills. Work-up here did not reveal any infection, urine analysis is normal.  CT head without any acute findings.  Chest x-ray does have some vascular congestion.  He is being admitted for his weakness.  PAST MEDICAL HISTORY:   Past Medical History:  Diagnosis Date  . Anxiety   . Aortic regurgitation    a. 03/2017 Echo: Mod AI; b. 05/2017 Echo: Triv AI.  [name] Kitchen Arthritis    osteoarthritis-left knee  . CAD (coronary artery disease)    a. Reported h/o cath ~ 2008 - ? small vessel dzs. No PCI performed; b. 03/2017 MV: EF 45-54%, no ischemia/infarct.  . Depression   . Diabetes mellitus without complication ([place])   . Diastolic dysfunction    a. 03/2017 Echo: EF 55-65%; b. 05/2017 Echo: EF 55-60%, no rwma, Gr1 DD. Triv AI.  Mildly  dil LA.  . ED (erectile dysfunction)   . Elevated troponin    a. chronic mild elevation - 0.03->0.04.  [name] Kitchen GERD (gastroesophageal reflux disease)   . Glaucoma   . Hyperlipidemia   . Hypertension   . Iron deficiency anemia due to chronic blood loss   . Left pontine stroke w/ cerebrovascular disease(HCC)    a. 05/2017 MRI/A: acute to subacute L pontine infarct. Chronic left thalamic lacunar infarct. Severe basilar artery stenosis w/ radiographic string sign of the distal 1/3. Mild to moderate L PCA P2 segment stenosis; b. 05/2017 Carotid U/S: <50% bilat ICA stenosis.  . Lumbago    Lumbosacral Neuritis  . Prostate cancer ([place])     PAST SURGICAL HISTORY:   Past Surgical History:  Procedure Laterality Date  . CARPAL TUNNEL RELEASE    . REPLACEMENT TOTAL KNEE BILATERAL      SOCIAL HISTORY:   Social History   Tobacco Use  . Smoking status: Former Smoker    Last attempt to quit: 12/02/1994    Years since quitting: 23.3  . Smokeless tobacco: Never Used  Substance Use Topics  . Alcohol use: Yes    Alcohol/week: 1.0 standard drinks    Types: 1 Cans of beer per week    Comment: occasional    FAMILY HISTORY:   Family History  Problem Relation Age of Onset  . Stroke Mother   . Hypertension Mother   .  Hypertension Father   . Stroke Sister   . Prostate cancer Neg Hx   . Kidney cancer Neg Hx   . Bladder Cancer Neg Hx     DRUG ALLERGIES:   Allergies  Allergen Reactions  . Amlodipine     Lower extremity swelling at 10 mg.  . Fluoxetine Other (See Comments)    headache  . Meloxicam Other (See Comments)    Other reaction(s): Dizziness Nervousness.     REVIEW OF SYSTEMS:   Review of Systems  Constitutional: Positive for malaise/fatigue. Negative for chills, fever and weight loss.  HENT: Negative for ear discharge, ear pain, hearing loss, nosebleeds and tinnitus.   Eyes: Negative for blurred vision, double vision and photophobia.  Respiratory: Positive for shortness of  breath. Negative for cough, hemoptysis and wheezing.   Cardiovascular: Negative for chest pain, palpitations, orthopnea and leg swelling.  Gastrointestinal: Negative for abdominal pain, constipation, diarrhea, heartburn, melena, nausea and vomiting.  Genitourinary: Negative for dysuria, frequency, hematuria and urgency.  Musculoskeletal: Positive for back pain. Negative for myalgias and neck pain.  Skin: Negative for rash.  Neurological: Positive for speech change, focal weakness and weakness. Negative for dizziness, tingling, tremors, sensory change and headaches.  Endo/Heme/Allergies: Does not bruise/bleed easily.  Psychiatric/Behavioral: Negative for depression.    MEDICATIONS AT HOME:   Prior to Admission medications   Medication Sig Start Date End Date Taking? Authorizing Provider  acetaminophen (TYLENOL 8 HOUR ARTHRITIS PAIN) 650 MG CR tablet Take 650 mg by mouth every 8 (eight) hours as needed for pain.    [provider]  aspirin EC 81 MG tablet Take 81 mg by mouth daily.    [provider]  atorvastatin (LIPITOR) 10 MG tablet Take 1 tablet (10 mg total) by mouth daily. 04/24/17   Minna Merritts, MD  Blood Glucose Monitoring Suppl (ACCU-CHEK AVIVA PLUS) w/Device KIT Use to check sugar levels x 2 daily 06/13/17   Guadalupe Maple, MD  clopidogrel (PLAVIX) 75 MG tablet Take 1 tablet (75 mg total) by mouth daily. 12/20/17   Guadalupe Maple, MD  DULoxetine (CYMBALTA) 20 MG capsule  10/04/17   [provider]  gabapentin (NEURONTIN) 100 MG capsule Take 100 mg by mouth 2 (two) times daily. Once a day at night    [provider]  hydrALAZINE (APRESOLINE) 25 MG tablet Take 1 tablet (25 mg total) by mouth 3 (three) times daily. 02/22/18   Johnson, Megan P, DO  hydrochlorothiazide (HYDRODIURIL) 25 MG tablet Take 1 tablet (25 mg total) by mouth daily. 12/20/17   Guadalupe Maple, MD  Lancets (ACCU-CHEK SOFT TOUCH) lancets Use as instructed 06/13/17   Guadalupe Maple, MD  latanoprost (XALATAN) 0.005 % ophthalmic solution Place 1 drop into both eyes at bedtime.  06/08/16   [provider]  LORazepam (ATIVAN) 0.5 MG tablet Take 1 tablet (0.5 mg total) by mouth 2 (two) times daily as needed for anxiety. 02/26/18   Park Liter P, DO  metFORMIN (GLUCOPHAGE) 500 MG tablet Take 1 tablet (500 mg total) by mouth daily with breakfast. 12/20/17   Guadalupe Maple, MD  naproxen sodium (ALEVE) 220 MG tablet Take 220 mg by mouth 2 (two) times daily as needed.     [provider]  pantoprazole (PROTONIX) 40 MG tablet Take 1 tablet (40 mg total) by mouth daily. 04/24/17   Minna Merritts, MD  propranolol ER (INDERAL LA) 80 MG 24 hr capsule Take 1 capsule (80 mg total)  by mouth daily. 12/20/17   Guadalupe Maple, MD  telmisartan (MICARDIS) 80 MG tablet Take 1 tablet (80 mg total) by mouth daily. 12/20/17   Guadalupe Maple, MD      VITAL SIGNS:  Blood pressure (!) 133/92, pulse 84, temperature 98 F (36.7 C), temperature source Oral, resp. rate 20, height 5' 7"  (1.702 m), weight 81.6 kg, SpO2 97 %.  PHYSICAL EXAMINATION:   Physical Exam  GENERAL:  84 y.o.-year-old patient lying in the bed with no acute distress.  EYES: Pupils equal, round, reactive to light and accommodation. No scleral icterus. Extraocular muscles intact.  HEENT: Head atraumatic, normocephalic. Oropharynx and nasopharynx clear.  NECK:  Supple, no jugular venous distention. No thyroid enlargement, no tenderness.  LUNGS: Normal breath sounds bilaterally, no wheezing, rales,rhonchi or crepitation. No use of accessory muscles of respiration. Decreased bibasilar breath sounds noted. CARDIOVASCULAR: S1, S2 normal. No  rubs, or gallops. 3/6 systolic murmur present ABDOMEN: Soft, nontender, nondistended. Bowel sounds present. No organomegaly or mass.  EXTREMITIES: No pedal edema, cyanosis, or clubbing.  Right hand resting tremors noted. NEUROLOGIC: Cranial nerves II through XII are intact.  Significant expressive aphasia noted.  Muscle strength 5/5 on right side and 4+/5 on left side. Sensation intact. Gait not checked.  PSYCHIATRIC: The patient is alert and oriented x 3. expressive apahsia  SKIN: No obvious rash, lesion, or ulcer.   LABORATORY PANEL:   CBC Recent Labs  Lab 04/20/18 1556  WBC 6.8  HGB 10.4*  HCT 33.9*  PLT 260   ------------------------------------------------------------------------------------------------------------------  Chemistries  Recent Labs  Lab 04/20/18 1556  NA 140  K 4.2  CL 103  CO2 27  GLUCOSE 268*  BUN 20  CREATININE 0.93  CALCIUM 9.1  AST 22  ALT 12  ALKPHOS 64  BILITOT 0.8   ------------------------------------------------------------------------------------------------------------------  Cardiac Enzymes Recent Labs  Lab 04/20/18 1824  TROPONINI 0.05*   ------------------------------------------------------------------------------------------------------------------  RADIOLOGY:  Dg Chest 2 View  Result Date: 04/20/2018 CLINICAL DATA:  Weakness EXAM: CHEST - 2 VIEW COMPARISON:  12/17/2017 FINDINGS: Cardiomegaly with vascular congestion. Mild elevation of the right hemidiaphragm, stable. No edema. Bibasilar atelectasis. No visible effusions. No acute bony abnormality. IMPRESSION: Cardiomegaly with vascular congestion.  Bibasilar atelectasis. Electronically Signed   By: Rolm Baptise M.D.   On: 04/20/2018 16:28   Ct Head Wo Contrast  Result Date: 04/20/2018 CLINICAL DATA:  Altered level of consciousness EXAM: CT HEAD WITHOUT CONTRAST TECHNIQUE: Contiguous axial images were obtained from the base of the skull through the vertex without intravenous contrast. COMPARISON:  MRI brain dated 05/18/2017 FINDINGS: Brain: No evidence of acute infarction, hemorrhage, hydrocephalus, extra-axial collection or mass lesion/mass effect. Subcortical white matter and periventricular small vessel ischemic changes. Vascular: Intracranial  atherosclerosis. Skull: Normal. Negative for fracture or focal lesion. Sinuses/Orbits: The visualized paranasal sinuses are essentially clear. The mastoid air cells are unopacified. Other: None. IMPRESSION: No evidence of acute intracranial abnormality. Small vessel ischemic changes. Electronically Signed   By: Julian Hy M.D.   On: 04/20/2018 16:32    EKG:   Orders placed or performed in visit on 04/20/18  . EKG 12-Lead    IMPRESSION AND PLAN:   Shane Badeaux  is a 84 y.o. male with a known history of stroke, with minimal left-sided weakness and expressive aphasia, CAD, arthritis, diabetes, GERD, hypertension, iron deficiency anemia presents from home secondary to significant weakness this morning.  1. Weakness- check TSH, Vit D level and B12 - hb stable at baseline -  PT consult  2.  Acute diastolic CHF-with minimal exacerbation. -Chronically on 3 L oxygen.  Chest x-ray with some congestion noted. -Last echocardiogram with normal EF from December 2018. -Start on low-dose Lasix for now.  3.  Hypertension-patient on ARB, hydrochlorothiazide, hydralazine.  4.  Prior history of stroke-with minimal left-sided weakness, expressive aphasia.  No changes. -Continue aspirin, Plavix and statin  5.  GERD-PPI  6.  Slightly elevated troponin-secondary to CHF.  Check BNP  7.  Essential tremors-more noticed on the left hand. -Continue propranolol  8.  DVT prophylaxis-Lovenox  PT consulted.   All the records are reviewed and case discussed with ED provider. Management plans discussed with the patient, family and they are in agreement.  CODE STATUS: Full Code  TOTAL TIME TAKING CARE OF THIS PATIENT: 50 minutes.    Gladstone Lighter M.D on 04/20/2018 at 8:34 PM  Between 7am to 6pm - Pager - [phone]  After 6pm go to [email or web address] - password [email or web address] [facility]  Office  [phone]  CC: Primary care physician; Guadalupe Maple, MD

## 2018-04-21 DIAGNOSIS — I5031 Acute diastolic (congestive) heart failure: Secondary | ICD-10-CM | POA: Diagnosis not present

## 2018-04-21 DIAGNOSIS — I639 Cerebral infarction, unspecified: Secondary | ICD-10-CM | POA: Diagnosis not present

## 2018-04-21 DIAGNOSIS — D649 Anemia, unspecified: Secondary | ICD-10-CM | POA: Diagnosis not present

## 2018-04-21 DIAGNOSIS — R531 Weakness: Secondary | ICD-10-CM | POA: Diagnosis not present

## 2018-04-21 LAB — CBC
HCT: 29 % — ABNORMAL LOW (ref 39.0–52.0)
Hemoglobin: 9.3 g/dL — ABNORMAL LOW (ref 13.0–17.0)
MCH: 31.2 pg (ref 26.0–34.0)
MCHC: 32.1 g/dL (ref 30.0–36.0)
MCV: 97.3 fL (ref 80.0–100.0)
Platelets: 235 10*3/uL (ref 150–400)
RBC: 2.98 MIL/uL — ABNORMAL LOW (ref 4.22–5.81)
RDW: 12.1 % (ref 11.5–15.5)
WBC: 7.2 10*3/uL (ref 4.0–10.5)
nRBC: 0 % (ref 0.0–0.2)

## 2018-04-21 LAB — BASIC METABOLIC PANEL
Anion gap: 7 (ref 5–15)
BUN: 21 mg/dL (ref 8–23)
CO2: 32 mmol/L (ref 22–32)
Calcium: 9.1 mg/dL (ref 8.9–10.3)
Chloride: 101 mmol/L (ref 98–111)
Creatinine, Ser: 1 mg/dL (ref 0.61–1.24)
GFR calc Af Amer: >60 mL/min (ref >60)
GFR calc non Af Amer: >60 mL/min (ref >60)
Glucose, Bld: 148 mg/dL — ABNORMAL HIGH (ref 70–99)
Potassium: 3.3 mmol/L — ABNORMAL LOW (ref 3.5–5.1)
Sodium: 140 mmol/L (ref 135–145)

## 2018-04-21 LAB — TROPONIN I
Troponin I: 0.07 ng/mL (ref <0.03)
Troponin I: 0.08 ng/mL (ref <0.03)

## 2018-04-21 LAB — VITAMIN B12: Vitamin B-12: 521 pg/mL (ref 180–914)

## 2018-04-21 MED ORDER — TESTOSTERONE 4 MG/24HR TD PT24
1.0000 | MEDICATED_PATCH | Freq: Every day | TRANSDERMAL | Status: DC
  Filled 2018-04-21: qty 1

## 2018-04-21 MED ORDER — TESTOSTERONE 4 MG/24HR TD PT24: 1.0000 | MEDICATED_PATCH | Freq: Every day | TRANSDERMAL | 0 refills | Status: DC

## 2018-04-21 MED ORDER — CYANOCOBALAMIN 1000 MCG/ML IJ SOLN
1000.0000 ug | Freq: Once | INTRAMUSCULAR | Status: AC
  Administered 2018-04-21: 18:00:00 1000 ug via INTRAMUSCULAR
  Filled 2018-04-21: qty 1

## 2018-04-21 MED ORDER — ORAL CARE MOUTH RINSE
15.0000 mL | Freq: Two times a day (BID) | OROMUCOSAL | Status: DC
  Administered 2018-04-21 – 2018-04-23 (×5): 15 mL via OROMUCOSAL

## 2018-04-21 NOTE — Plan of Care (Signed)

## 2018-04-21 NOTE — Discharge Summary (Signed)
[facility] [place] NAME: Todd [name]    MR#:  [id number]  DATE OF BIRTH:  12/26/1930  DATE OF ADMISSION:  04/20/2018 ADMITTING PHYSICIAN: Gladstone Lighter, MD  DATE OF DISCHARGE: No discharge date for patient encounter.  PRIMARY CARE PHYSICIAN: Guadalupe Maple, MD    ADMISSION DIAGNOSIS:  Weakness [R53.1] Elevated troponin [R79.89]  DISCHARGE DIAGNOSIS:  Active Problems:   Weakness   SECONDARY DIAGNOSIS:   Past Medical History:  Diagnosis Date  . Anxiety   . Aortic regurgitation    a. 03/2017 Echo: Mod AI; b. 05/2017 Echo: Triv AI.  [name] Kitchen Arthritis    osteoarthritis-left knee  . CAD (coronary artery disease)    a. Reported h/o cath ~ 2008 - ? small vessel dzs. No PCI performed; b. 03/2017 MV: EF 45-54%, no ischemia/infarct.  . Depression   . Diabetes mellitus without complication ([place])   . Diastolic dysfunction    a. 03/2017 Echo: EF 55-65%; b. 05/2017 Echo: EF 55-60%, no rwma, Gr1 DD. Triv AI.  Mildly dil LA.  . ED (erectile dysfunction)   . Elevated troponin    a. chronic mild elevation - 0.03->0.04.  [name] Kitchen GERD (gastroesophageal reflux disease)   . Glaucoma   . Hyperlipidemia   . Hypertension   . Iron deficiency anemia due to chronic blood loss   . Left pontine stroke w/ cerebrovascular disease(HCC)    a. 05/2017 MRI/A: acute to subacute L pontine infarct. Chronic left thalamic lacunar infarct. Severe basilar artery stenosis w/ radiographic string sign of the distal 1/3. Mild to moderate L PCA P2 segment stenosis; b. 05/2017 Carotid U/S: <50% bilat ICA stenosis.  . Lumbago    Lumbosacral Neuritis  . Prostate cancer [facility])     HOSPITAL COURSE:  Todd [name]  is a 84 y.o. male with a known history of stroke, with minimal left-sided weakness and expressive aphasia, CAD, arthritis, diabetes, GERD, hypertension, iron deficiency anemia presents from home secondary to significant weakness this morning.  *Weakness Most  likely secondary to chronic deconditioning, history of stroke aphasia/hemiparesis Patient and the patient's family do not want rehab-are interested in home health physical therapy going forward, given B12 injection x1, TSH was normal  *History of cerebrovascular accident with aphasia/hemiparesis Stable We will sent home with home health PT as well as speech therapy for dysarthria as well as dysphasia  * Acute diastolic CHF-with minimal exacerbation Resolved Treated on our congestive heart failure protocol Most recent echocardiogram with normal EF from December 2018.  *Hypertension Controlled on current regiment   DISCHARGE CONDITIONS:   stable  CONSULTS OBTAINED:    DRUG ALLERGIES:   Allergies  Allergen Reactions  . Amlodipine     Lower extremity swelling at 10 mg.  . Fluoxetine Other (See Comments)    headache  . Meloxicam Other (See Comments)    Other reaction(s): Dizziness Nervousness.     DISCHARGE MEDICATIONS:   Allergies as of 04/21/2018      Reactions   Amlodipine    Lower extremity swelling at 10 mg.   Fluoxetine Other (See Comments)   headache   Meloxicam Other (See Comments)   Other reaction(s): Dizziness Nervousness.      Medication List    TAKE these medications   ACCU-CHEK AVIVA PLUS w/Device Kit Use to check sugar levels x 2 daily   accu-chek soft touch lancets Use as instructed   aspirin EC 81 MG tablet Take 81 mg by mouth daily.  atorvastatin 10 MG tablet Commonly known as:  LIPITOR Take 1 tablet (10 mg total) by mouth daily.   clopidogrel 75 MG tablet Commonly known as:  PLAVIX Take 1 tablet (75 mg total) by mouth daily.   DULoxetine 20 MG capsule Commonly known as:  CYMBALTA   gabapentin 100 MG capsule Commonly known as:  NEURONTIN Take 100 mg by mouth 2 (two) times daily. Once a day at night   hydrALAZINE 25 MG tablet Commonly known as:  APRESOLINE Take 1 tablet (25 mg total) by mouth 3 (three) times daily.    hydrochlorothiazide 25 MG tablet Commonly known as:  HYDRODIURIL Take 1 tablet (25 mg total) by mouth daily.   latanoprost 0.005 % ophthalmic solution Commonly known as:  XALATAN Place 1 drop into both eyes at bedtime.   LORazepam 0.5 MG tablet Commonly known as:  ATIVAN Take 1 tablet (0.5 mg total) by mouth 2 (two) times daily as needed for anxiety.   metFORMIN 500 MG tablet Commonly known as:  GLUCOPHAGE Take 1 tablet (500 mg total) by mouth daily with breakfast.   naproxen sodium 220 MG tablet Commonly known as:  ALEVE Take 220 mg by mouth 2 (two) times daily as needed.   pantoprazole 40 MG tablet Commonly known as:  PROTONIX Take 1 tablet (40 mg total) by mouth daily.   propranolol ER 80 MG 24 hr capsule Commonly known as:  INDERAL LA Take 1 capsule (80 mg total) by mouth daily.   telmisartan 80 MG tablet Commonly known as:  MICARDIS Take 1 tablet (80 mg total) by mouth daily.   testosterone 4 MG/24HR Pt24 patch Commonly known as:  ANDRODERM Place 1 patch onto the skin daily.   TYLENOL 8 HOUR ARTHRITIS PAIN 650 MG CR tablet Generic drug:  acetaminophen Take 650 mg by mouth every 8 (eight) hours as needed for pain.        DISCHARGE INSTRUCTIONS:      If you experience worsening of your admission symptoms, develop shortness of breath, life threatening emergency, suicidal or homicidal thoughts you must seek medical attention immediately by calling 911 or calling your MD immediately  if symptoms less severe.  You Must read complete instructions/literature along with all the possible adverse reactions/side effects for all the Medicines you take and that have been prescribed to you. Take any new Medicines after you have completely understood and accept all the possible adverse reactions/side effects.   Please note  You were cared for by a hospitalist during your hospital stay. If you have any questions about your discharge medications or the care you received  while you were in the hospital after you are discharged, you can call the unit and asked to speak with the hospitalist on call if the hospitalist that took care of you is not available. Once you are discharged, your primary care physician will handle any further medical issues. Please note that NO REFILLS for any discharge medications will be authorized once you are discharged, as it is imperative that you return to your primary care physician (or establish a relationship with a primary care physician if you do not have one) for your aftercare needs so that they can reassess your need for medications and monitor your lab values.    Today   CHIEF COMPLAINT:   Chief Complaint  Patient presents with  . Weakness    HISTORY OF PRESENT ILLNESS:   84 y.o. male with a known history of stroke, with minimal left-sided weakness and  expressive aphasia, CAD, arthritis, diabetes, GERD, hypertension, iron deficiency anemia presents from home secondary to significant weakness this morning. Due to his expressive aphasia, patient is unable to provide information.  Most of the history is obtained from his daughters at bedside.  According to them, patient has been complaining of weakness on and off and was noted to have iron deficiency anemia and is started on supplements.  He was doing well, ambulating with a walker at baseline but today unable to get out of bed.  Denies any chest pain, complains of some dyspnea.  Chronically on 3 L oxygen.  Has chronic low back pain, no abdominal pain today, no nausea or vomiting.  Denies any fevers or chills. Work-up here did not reveal any infection, urine analysis is normal.  CT head without any acute findings.  Chest x-ray does have some vascular congestion.  He is being admitted for his weakness.   VITAL SIGNS:  Blood pressure 125/81, pulse 93, temperature 98.1 F (36.7 C), temperature source Oral, resp. rate 16, height 5' 8"  (1.727 m), weight 85.3 kg, SpO2 100 %.  I/O:     Intake/Output Summary (Last 24 hours) at 04/21/2018 1109 Last data filed at 04/21/2018 0100 Gross per 24 hour  Intake -  Output 300 ml  Net -300 ml    PHYSICAL EXAMINATION:  GENERAL:  84 y.o.-year-old patient lying in the bed with no acute distress.  EYES: Pupils equal, round, reactive to light and accommodation. No scleral icterus. Extraocular muscles intact.  HEENT: Head atraumatic, normocephalic. Oropharynx and nasopharynx clear.  NECK:  Supple, no jugular venous distention. No thyroid enlargement, no tenderness.  LUNGS: Normal breath sounds bilaterally, no wheezing, rales,rhonchi or crepitation. No use of accessory muscles of respiration.  CARDIOVASCULAR: S1, S2 normal. No murmurs, rubs, or gallops.  ABDOMEN: Soft, non-tender, non-distended. Bowel sounds present. No organomegaly or mass.  EXTREMITIES: No pedal edema, cyanosis, or clubbing.  NEUROLOGIC: Cranial nerves II through XII are intact. Muscle strength 5/5 in all extremities. Sensation intact. Gait not checked.  PSYCHIATRIC: The patient is alert and oriented x 3.  SKIN: No obvious rash, lesion, or ulcer.   DATA REVIEW:   CBC Recent Labs  Lab 04/21/18 0253  WBC 7.2  HGB 9.3*  HCT 29.0*  PLT 235    Chemistries  Recent Labs  Lab 04/20/18 1556 04/21/18 0253  NA 140 140  K 4.2 3.3*  CL 103 101  CO2 27 32  GLUCOSE 268* 148*  BUN 20 21  CREATININE 0.93 1.00  CALCIUM 9.1 9.1  AST 22  --   ALT 12  --   ALKPHOS 64  --   BILITOT 0.8  --     Cardiac Enzymes Recent Labs  Lab 04/21/18 0921  TROPONINI 0.08*    Microbiology Results  Results for orders placed or performed in visit on 01/09/17  Microscopic Examination     Status: Abnormal   Collection Time: 01/09/17  1:59 PM  Result Value Ref Range Status   WBC, UA 0-5 0 - 5 /hpf Final   RBC, UA 0-2 0 - 2 /hpf Final   Epithelial Cells (non renal) 0-10 0 - 10 /hpf Final   Renal Epithel, UA 0-10 (A) None seen /hpf Final   Mucus, UA Present (A) Not  Estab. Final   Bacteria, UA None seen None seen/Few Final    RADIOLOGY:  Dg Chest 2 View  Result Date: 04/20/2018 CLINICAL DATA:  Weakness EXAM: CHEST - 2 VIEW COMPARISON:  12/17/2017  FINDINGS: Cardiomegaly with vascular congestion. Mild elevation of the right hemidiaphragm, stable. No edema. Bibasilar atelectasis. No visible effusions. No acute bony abnormality. IMPRESSION: Cardiomegaly with vascular congestion.  Bibasilar atelectasis. Electronically Signed   By: Rolm Baptise M.D.   On: 04/20/2018 16:28   Ct Head Wo Contrast  Result Date: 04/20/2018 CLINICAL DATA:  Altered level of consciousness EXAM: CT HEAD WITHOUT CONTRAST TECHNIQUE: Contiguous axial images were obtained from the base of the skull through the vertex without intravenous contrast. COMPARISON:  MRI brain dated 05/18/2017 FINDINGS: Brain: No evidence of acute infarction, hemorrhage, hydrocephalus, extra-axial collection or mass lesion/mass effect. Subcortical white matter and periventricular small vessel ischemic changes. Vascular: Intracranial atherosclerosis. Skull: Normal. Negative for fracture or focal lesion. Sinuses/Orbits: The visualized paranasal sinuses are essentially clear. The mastoid air cells are unopacified. Other: None. IMPRESSION: No evidence of acute intracranial abnormality. Small vessel ischemic changes. Electronically Signed   By: Julian Hy M.D.   On: 04/20/2018 16:32    EKG:   Orders placed or performed in visit on 04/20/18  . EKG 12-Lead      Management plans discussed with the patient, family and they are in agreement.  CODE STATUS:     Code Status Orders  (From admission, onward)         Start     Ordered   04/20/18 2123  Full code  Continuous     04/20/18 2122        Code Status History    Date Active Date Inactive Code Status Order ID Comments User Context   05/18/2017 0303 05/21/2017 1928 Full Code [id number]  Lance Coon, MD Inpatient   03/17/2017 0419 03/17/2017 2109  Full Code [id number]  Saundra Shelling, MD ED   03/05/2015 0019 03/05/2015 1507 Full Code [id number]  Lance Coon, MD Inpatient    Advance Directive Documentation     Most Recent Value  Type of Advance Directive  Healthcare Power of Attorney  Pre-existing out of facility DNR order (yellow form or pink MOST form)  -  "MOST" Form in Place?  -      TOTAL TIME TAKING CARE OF THIS PATIENT: 40 minutes.    Avel Peace Salary M.D on 04/21/2018 at 11:09 AM  Between 7am to 6pm - Pager - [phone]  After 6pm go to [email or web address] - password [email or web address] [facility]  Office  [phone]  CC: Primary care physician; Guadalupe Maple, MD   Note: This dictation was prepared with Dragon dictation along with smaller phrase technology. Any transcriptional errors that result from this process are unintentional.

## 2018-04-21 NOTE — Plan of Care (Signed)
  Problem: Clinical Measurements: Goal: Respiratory complications will improve Outcome: Not Progressing  Pt's oxygen saturation decreasing with exertion/ambulation

## 2018-04-21 NOTE — Progress Notes (Signed)
Physical Therapy Evaluation Patient Details Name: Todd [name] MRN: [id number] DOB: Aug 02, 1930 Today's Date: 04/21/2018   History of Present Illness  Todd [name]  is a 84 y.o. male with a known history of stroke, with minimal left-sided weakness and expressive aphasia, CAD, arthritis, diabetes, GERD, hypertension, iron deficiency anemia presents from home secondary to significant weakness this morning.  Clinical Impression  Patient performs bed mobility with MI. He performs sit to stand with RW and min assist with O2 sat level dropping from 94 % to 77 % on 3 L O2. Patient ambulated 4 feet with O2 sat levels dropping to 77% x 2 attempts. He needs min assist with ambulation and is unsteady with gait. He would benefit from skilled nursing facility to monitor his O2 sat levels with ambulation as well as increasing his strength and balance in static and dynamic standing. His family was educated about the O2 sat level dropping with activity and the nurse was contacted about the O2 saturation level dropping to 77%. PT recommends continued PT services to monitor safe ambulation and mobility with O2 .    Follow Up Recommendations SNF    Equipment Recommendations  Rolling walker with 5" wheels    Recommendations for Other Services       Precautions / Restrictions Precautions Precautions: Fall Restrictions Weight Bearing Restrictions: No      Mobility  Bed Mobility Overal bed mobility: Modified Independent                Transfers Overall transfer level: Needs assistance Equipment used: Rolling walker (2 wheeled)             General transfer comment: O2 sat level decreased to 77 %  Ambulation/Gait Ambulation/Gait assistance: Min guard Gait Distance (Feet): 4 Feet Assistive device: Rolling walker (2 wheeled) Gait Pattern/deviations: Step-to pattern;Trunk flexed     General Gait Details: slow , decreased step height  Stairs            Wheelchair Mobility     Modified Rankin (Stroke Patients Only)       Balance Overall balance assessment: Needs assistance Sitting-balance support: No upper extremity supported Sitting balance-Leahy Scale: Good     Standing balance support: Bilateral upper extremity supported Standing balance-Leahy Scale: Fair                               Pertinent Vitals/Pain Pain Assessment: No/denies pain    Home Living Family/patient expects to be discharged to:: Private residence Living Arrangements: Children;Other relatives Available Help at Discharge: Family   Home Access: Ramped entrance     Home Layout: One level        Prior Function Level of Independence: Needs assistance   Gait / Transfers Assistance Needed: (ambulated with RW MI)           Hand Dominance        Extremity/Trunk Assessment   Upper Extremity Assessment Upper Extremity Assessment: Overall WFL for tasks assessed    Lower Extremity Assessment Lower Extremity Assessment: Overall WFL for tasks assessed       Communication      Cognition Arousal/Alertness: Awake/alert Behavior During Therapy: WFL for tasks assessed/performed Overall Cognitive Status: Within Functional Limits for tasks assessed  General Comments      Exercises     Assessment/Plan    PT Assessment (O2 sat decreased to 77 % with standing and amb 4 feet , 3 L )  PT Problem List         PT Treatment Interventions      PT Goals (Current goals can be found in the Care Plan section)  Acute Rehab PT Goals Patient Stated Goal: to walk PT Goal Formulation: With patient Time For Goal Achievement: 05/05/18 Potential to Achieve Goals: Fair    Frequency     Barriers to discharge        Co-evaluation               AM-PAC PT "6 Clicks" Daily Activity  Outcome Measure Difficulty turning over in bed (including adjusting bedclothes, sheets and blankets)?: A  Little Difficulty moving from lying on back to sitting on the side of the bed? : A Little Difficulty sitting down on and standing up from a chair with arms (e.g., wheelchair, bedside commode, etc,.)?: A Little Help needed moving to and from a bed to chair (including a wheelchair)?: A Little Help needed walking in hospital room?: A Little Help needed climbing 3-5 steps with a railing? : A Lot 6 Click Score: 17    End of Session Equipment Utilized During Treatment: Gait belt Activity Tolerance: Treatment limited secondary to medical complications (Comment) Patient left: (O2 sat decreased to 77 %) Nurse Communication: Other (comment) PT Visit Diagnosis: Unsteadiness on feet (R26.81);Difficulty in walking, not elsewhere classified (R26.2)    Time: 6195-0932 PT Time Calculation (min) (ACUTE ONLY): 15 min   Charges:   PT Evaluation $PT Eval Low Complexity: 1 Low PT Treatments $Therapeutic Activity: 8-22 mins          Alanson Puls, PT DPT 04/21/2018, 3:21 PM

## 2018-04-21 NOTE — Care Management Note (Addendum)
Case Management Note  Patient Details  Name: Todd [name] MRN: [id number] Date of Birth: 06-Oct-1930  Subjective/Objective:    Patient to be discharged per MD order. Orders in place for home health services. Patient struggles with expressive aphasia, spoke primarily with daughter. Patient lives with his daughter and uses O2 and a walker in the home. PT recommends SNF but per MD family prefers home health. Referral placed with [facility]. Patient oxygen sats dropped during therapy, unknown if MD will progress with d/c but will inform [facility]. Family to transport.                  Action/Plan:   Expected Discharge Date:  04/21/18               Expected Discharge Plan:  [place]  In-House Referral:     Discharge planning Services  CM Consult  Post Acute Care Choice:  Home Health Choice offered to:  Patient, Adult Children  DME Arranged:    DME Agency:     HH Arranged:  PT, OT, Nurse's Aide, Social Work, Theme park manager Therapy HH Agency:  [facility]  Status of Service:  Completed, signed off  If discussed at [employer], dates discussed:    Additional Comments:  Latanya Maudlin, RN 04/21/2018, 4:14 PM

## 2018-04-21 NOTE — Care Management Obs Status (Signed)
[facility] NOTIFICATION   Patient Details  Name: Todd [name] MRN: [id number] Date of Birth: 07/08/1930   Medicare Observation Status Notification Given:  Yes    Trystin Terhune A Alondra Sahni, RN 04/21/2018, 9:22 AM

## 2018-04-22 ENCOUNTER — Inpatient Hospital Stay: Payer: PPO

## 2018-04-22 ENCOUNTER — Encounter: Payer: PPO | Admitting: Speech Pathology

## 2018-04-22 ENCOUNTER — Observation Stay: Payer: PPO

## 2018-04-22 DIAGNOSIS — I248 Other forms of acute ischemic heart disease: Secondary | ICD-10-CM | POA: Diagnosis present

## 2018-04-22 DIAGNOSIS — Z8546 Personal history of malignant neoplasm of prostate: Secondary | ICD-10-CM | POA: Diagnosis not present

## 2018-04-22 DIAGNOSIS — R531 Weakness: Secondary | ICD-10-CM | POA: Diagnosis present

## 2018-04-22 DIAGNOSIS — I5033 Acute on chronic diastolic (congestive) heart failure: Secondary | ICD-10-CM | POA: Diagnosis present

## 2018-04-22 DIAGNOSIS — Z96653 Presence of artificial knee joint, bilateral: Secondary | ICD-10-CM | POA: Diagnosis present

## 2018-04-22 DIAGNOSIS — K219 Gastro-esophageal reflux disease without esophagitis: Secondary | ICD-10-CM | POA: Diagnosis present

## 2018-04-22 DIAGNOSIS — E782 Mixed hyperlipidemia: Secondary | ICD-10-CM | POA: Diagnosis present

## 2018-04-22 DIAGNOSIS — G250 Essential tremor: Secondary | ICD-10-CM | POA: Diagnosis present

## 2018-04-22 DIAGNOSIS — Z87891 Personal history of nicotine dependence: Secondary | ICD-10-CM | POA: Diagnosis not present

## 2018-04-22 DIAGNOSIS — E1165 Type 2 diabetes mellitus with hyperglycemia: Secondary | ICD-10-CM | POA: Diagnosis present

## 2018-04-22 DIAGNOSIS — G8929 Other chronic pain: Secondary | ICD-10-CM | POA: Diagnosis present

## 2018-04-22 DIAGNOSIS — F329 Major depressive disorder, single episode, unspecified: Secondary | ICD-10-CM | POA: Diagnosis present

## 2018-04-22 DIAGNOSIS — R471 Dysarthria and anarthria: Secondary | ICD-10-CM | POA: Diagnosis not present

## 2018-04-22 DIAGNOSIS — M545 Low back pain: Secondary | ICD-10-CM | POA: Diagnosis present

## 2018-04-22 DIAGNOSIS — I69320 Aphasia following cerebral infarction: Secondary | ICD-10-CM | POA: Diagnosis not present

## 2018-04-22 DIAGNOSIS — I69354 Hemiplegia and hemiparesis following cerebral infarction affecting left non-dominant side: Secondary | ICD-10-CM | POA: Diagnosis not present

## 2018-04-22 DIAGNOSIS — R4702 Dysphasia: Secondary | ICD-10-CM | POA: Diagnosis not present

## 2018-04-22 DIAGNOSIS — F411 Generalized anxiety disorder: Secondary | ICD-10-CM | POA: Diagnosis present

## 2018-04-22 DIAGNOSIS — I2510 Atherosclerotic heart disease of native coronary artery without angina pectoris: Secondary | ICD-10-CM | POA: Diagnosis present

## 2018-04-22 DIAGNOSIS — D500 Iron deficiency anemia secondary to blood loss (chronic): Secondary | ICD-10-CM | POA: Diagnosis present

## 2018-04-22 DIAGNOSIS — J9621 Acute and chronic respiratory failure with hypoxia: Secondary | ICD-10-CM | POA: Diagnosis present

## 2018-04-22 DIAGNOSIS — H409 Unspecified glaucoma: Secondary | ICD-10-CM | POA: Diagnosis present

## 2018-04-22 DIAGNOSIS — J9600 Acute respiratory failure, unspecified whether with hypoxia or hypercapnia: Secondary | ICD-10-CM | POA: Diagnosis present

## 2018-04-22 DIAGNOSIS — I110 Hypertensive heart disease with heart failure: Secondary | ICD-10-CM | POA: Diagnosis present

## 2018-04-22 DIAGNOSIS — I351 Nonrheumatic aortic (valve) insufficiency: Secondary | ICD-10-CM | POA: Diagnosis present

## 2018-04-22 DIAGNOSIS — Z9981 Dependence on supplemental oxygen: Secondary | ICD-10-CM | POA: Diagnosis not present

## 2018-04-22 MED ORDER — FUROSEMIDE 10 MG/ML IJ SOLN
40.0000 mg | Freq: Two times a day (BID) | INTRAMUSCULAR | Status: DC
  Administered 2018-04-23: 40 mg via INTRAVENOUS
  Filled 2018-04-22: qty 4

## 2018-04-22 MED ORDER — IOHEXOL 350 MG/ML SOLN
75.0000 mL | Freq: Once | INTRAVENOUS | Status: AC | PRN
  Administered 2018-04-22: 17:00:00 75 mL via INTRAVENOUS

## 2018-04-22 MED ORDER — POTASSIUM CHLORIDE CRYS ER 20 MEQ PO TBCR
20.0000 meq | EXTENDED_RELEASE_TABLET | Freq: Once | ORAL | Status: AC
  Administered 2018-04-22: 14:00:00 20 meq via ORAL
  Filled 2018-04-22: qty 1

## 2018-04-22 NOTE — Evaluation (Signed)
Objective Swallowing Evaluation: Type of Study: MBS-Modified Barium Swallow Study   Patient Details  Name: Todd [name] MRN: [id number] Date of Birth: June 24, 1930  Today's Date: 04/22/2018 Time: SLP Start Time (ACUTE ONLY): 1105 -SLP Stop Time (ACUTE ONLY): 1205  SLP Time Calculation (min) (ACUTE ONLY): 60 min   Past Medical History:  Past Medical History:  Diagnosis Date  . Anxiety   . Aortic regurgitation    a. 03/2017 Echo: Mod AI; b. 05/2017 Echo: Triv AI.  [name] Kitchen Arthritis    osteoarthritis-left knee  . CAD (coronary artery disease)    a. Reported h/o cath ~ 2008 - ? small vessel dzs. No PCI performed; b. 03/2017 MV: EF 45-54%, no ischemia/infarct.  . Depression   . Diabetes mellitus without complication ([place])   . Diastolic dysfunction    a. 03/2017 Echo: EF 55-65%; b. 05/2017 Echo: EF 55-60%, no rwma, Gr1 DD. Triv AI.  Mildly dil LA.  . ED (erectile dysfunction)   . Elevated troponin    a. chronic mild elevation - 0.03->0.04.  [name] Kitchen GERD (gastroesophageal reflux disease)   . Glaucoma   . Hyperlipidemia   . Hypertension   . Iron deficiency anemia due to chronic blood loss   . Left pontine stroke w/ cerebrovascular disease(HCC)    a. 05/2017 MRI/A: acute to subacute L pontine infarct. Chronic left thalamic lacunar infarct. Severe basilar artery stenosis w/ radiographic string sign of the distal 1/3. Mild to moderate L PCA P2 segment stenosis; b. 05/2017 Carotid U/S: <50% bilat ICA stenosis.  . Lumbago    Lumbosacral Neuritis  . Prostate cancer [facility])    Past Surgical History:  Past Surgical History:  Procedure Laterality Date  . CARPAL TUNNEL RELEASE    . REPLACEMENT TOTAL KNEE BILATERAL     HPI: Pt is a 84 y.o. 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 Due to the Expressive  Aphasia, report comes from family who stated patient has been complaining of weakness on and off and was noted to have iron deficiency anemia and is started on supplements.  He was doing well, ambulating with a walker at baseline but today unable to get out of bed.  Denies any chest pain, complains of some dyspnea.  Chronically on 3 L oxygen.    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    Assessment / Plan / Recommendation  CHL IP CLINICAL IMPRESSIONS 04/22/2018  Clinical Impression Pt appears to present w/ Mild-Moderate oropharyngeal phase dysphagia w/ increased risk for aspiration secondary to the delayed pharyngeal swallow initiation and the reduced oral coordination/control in the oral phase. Aspiration (SILENT) did occur during trials of Thin liquids via cup. During the oral phase, pt exhibited increased oral phase time for bolus management and A-P transfer; lingual movements were min uncoordinated and bolus control was reduced allowing liquids to spill prematurily into the pharynx b/f initiation of swallowing. This reduced oral control w/ spillage into the pharynx b/f the swallow can significantly increase risk for aspiration b/f the swallow. Given time for completion of swallowing, oral clearing was noted w/ no remaining oral residue. During the pharyngeal phase, pt exhibited a delayed pharyngeal swallow initiation w/ liquids spilling to, and most often filling, the Pyriform Sinuses b/f the full initiation of the swallow w/ airway closure w/ both  thin and Nectar consistency liquids. This resulted in "flash" laryngeal Penetration x2 AND SILENT Aspiration of thin liquids via Cup x1. Pt was instructed to cough after ~30+ seconds. Timing for the Nectar liquids was slightly more appropriate w/ no aspiration occuring w/ (consecutive) trials via Cup. Timing of the pharyngeal swallow was appropriate at the level of the Valleculae for  trials of puree/soft solids. No significant pharyngeal residue remained post trials of Nectar liquids and foods indicating adequate pharyngeal pressure and laryngeal excursion; of note, min residue was noted in the vallecuale and pyriform sinuses w/ trials of thin liquids w/ no immediate awareness to use a f/u, dry swallow to clear.  No overt Esopahgeal phase deficits noted in the viewable cervical Esophagus.   SLP Visit Diagnosis Dysphagia, oropharyngeal phase (R13.12)  Attention and concentration deficit following --  Frontal lobe and executive function deficit following --  Impact on safety and function Mild aspiration risk;Moderate aspiration risk;Risk for inadequate nutrition/hydration      CHL IP TREATMENT RECOMMENDATION 04/22/2018  Treatment Recommendations Defer treatment plan to f/u with SLP     Prognosis 04/22/2018  Prognosis for Safe Diet Advancement Fair  Barriers to Reach Goals Severity of deficits;Time post onset  Barriers/Prognosis Comment --    CHL IP DIET RECOMMENDATION 04/22/2018  SLP Diet Recommendations Dysphagia 3 (Mech soft) solids;Nectar thick liquid  Liquid Administration via Cup  Medication Administration Whole meds with puree  Compensations Minimize environmental distractions;Slow rate;Small sips/bites;Lingual sweep for clearance of pocketing;Multiple dry swallows after each bite/sip;Follow solids with liquid  Postural Changes Remain semi-upright after after feeds/meals (Comment);Seated upright at 90 degrees      CHL IP OTHER RECOMMENDATIONS 04/22/2018  Recommended Consults (No Data)  Oral Care Recommendations Oral care BID;Patient independent with oral care;Staff/trained caregiver to provide oral care  Other Recommendations Order thickener from pharmacy;Prohibited food (jello, ice cream, thin soups);Remove water pitcher;Have oral suction available      CHL IP FOLLOW UP RECOMMENDATIONS 04/22/2018  Follow up Recommendations Home health SLP      CHL IP  FREQUENCY AND DURATION 04/22/2018  Speech Therapy Frequency (ACUTE ONLY) (No Data)  Treatment Duration (No Data)           CHL IP ORAL PHASE 04/22/2018  Oral Phase Impaired  Oral - Pudding Teaspoon NT  Oral - Pudding Cup --  Oral - Honey Teaspoon NT  Oral - Honey Cup --  Oral - Nectar Teaspoon --  Oral - Nectar Cup 5 trials - multiple sips  Oral - Nectar Straw --  Oral - Thin Teaspoon --  Oral - Thin Cup 5 trials - single, multiple sips  Oral - Thin Straw --  Oral - Puree 2 tsps  Oral - Mech Soft 2 trials  Oral - Regular NT  Oral - Multi-Consistency --  Oral - Pill NT  Oral Phase - Comment pt exhibited increased oral phase time for bolus management and A-P transfer; lingual movements were min uncoordinated and bolus control was reduced allowing liquids to spill prematurily into the pharynx b/f initiation of swallowing. This reduced oral control w/ spillage into the pharynx b/f the swallow can significantly increase risk for aspiration b/f the swallow. Given time for completion of swallowing, oral clearing was noted w/ no remaining oral residue.     CHL IP PHARYNGEAL PHASE 04/22/2018  Pharyngeal Phase Impaired  Pharyngeal- Pudding Teaspoon NT  Pharyngeal --  Pharyngeal- Pudding Cup --  Pharyngeal --  Pharyngeal- Honey Teaspoon NT  Pharyngeal --  Pharyngeal- Honey Cup --  Pharyngeal --  Pharyngeal- Nectar Teaspoon --  Pharyngeal --  Pharyngeal- Nectar Cup 5 trials - multiple sips  Pharyngeal --  Pharyngeal- Nectar Straw --  Pharyngeal --  Pharyngeal- Thin Teaspoon --  Pharyngeal --  Pharyngeal- Thin Cup 5 trials - single, multiple sips  Pharyngeal --  Pharyngeal- Thin Straw --  Pharyngeal --  Pharyngeal- Puree 2 trials  Pharyngeal --  Pharyngeal- Mechanical Soft 2 trials  Pharyngeal --  Pharyngeal- Regular NT  Pharyngeal --  Pharyngeal- Multi-consistency --  Pharyngeal --  Pharyngeal- Pill NT  Pharyngeal --  Pharyngeal Comment pt exhibited a delayed pharyngeal  swallow initiation w/ liquids spilling to, and most often filling, the Pyriform Sinuses b/f the full initiation of the swallow w/ airway closure w/ both thin and Nectar consistency liquids. This resulted in "flash" laryngeal Penetration x2 AND SILENT Aspiration of thin liquids via Cup x1. Pt was instructed to cough after ~30+ seconds. Timing for the Nectar liquids was slightly more appropriate w/ no aspiration occuring w/ (consecutive) trials via Cup. Timing of the pharyngeal swallow was appropriate at the level of the Valleculae for trials of puree/soft solids. No significant pharyngeal residue remained post trials of Nectar liquids and foods indicating adequate pharyngeal pressure and laryngeal excursion; of note, min residue was noted in the vallecuale and pyriform sinuses w/ trials of thin liquids w/ no immediate awareness to use a f/u, dry swallow to clear.      CHL IP CERVICAL ESOPHAGEAL PHASE 04/22/2018  Cervical Esophageal Phase WFL  Pudding Teaspoon --  Pudding Cup --  Honey Teaspoon --  Honey Cup --  Nectar Teaspoon --  Nectar Cup --  Nectar Straw --  Thin Teaspoon --  Thin Cup --  Thin Straw --  Puree --  Mechanical Soft --  Regular --  Multi-consistency --  Pill --  Cervical Esophageal Comment --        Orinda Kenner, MS, CCC-SLP , 04/22/2018, 3:06 PM

## 2018-04-22 NOTE — Plan of Care (Signed)

## 2018-04-22 NOTE — Progress Notes (Signed)
Care of patient taken over from [place], [place].  Patient resting comfortably in bed.  Denies pain.  Family at bedside.  Clarise Cruz, RN

## 2018-04-22 NOTE — Progress Notes (Signed)
Patient was ambulated oxygen sats dropped into the 70s with activity on 3 L of oxygen.

## 2018-04-22 NOTE — Progress Notes (Signed)
[place] at [facility]                                                                                                                                                                                  Patient Demographics   Todd [name], is a 84 y.o. male, DOB - 02-Oct-1930, [id number]  Admit date - 04/20/2018   Admitting Physician Gladstone Lighter, MD  Outpatient Primary MD for the patient is Guadalupe Maple, MD   LOS - 0  Subjective: Has some shortness of breath fatigue denies speech changes  Review of Systems:   CONSTITUTIONAL: No documented fever.  Positive fatigue, positive weakness. No weight gain, no weight loss.  EYES: No blurry or double vision.  ENT: No tinnitus. No postnasal drip. No redness of the oropharynx.  RESPIRATORY: No cough, no wheeze, no hemoptysis. No dyspnea.  CARDIOVASCULAR: No chest pain. No orthopnea. No palpitations. No syncope.  GASTROINTESTINAL: No nausea, no vomiting or diarrhea. No abdominal pain. No melena or hematochezia.  GENITOURINARY: No dysuria or hematuria.  ENDOCRINE: No polyuria or nocturia. No heat or cold intolerance.  HEMATOLOGY: No anemia. No bruising. No bleeding.  INTEGUMENTARY: No rashes. No lesions.  MUSCULOSKELETAL: No arthritis. No swelling. No gout.  NEUROLOGIC: No numbness, tingling, or ataxia. No seizure-type activity.  PSYCHIATRIC: No anxiety. No insomnia. No ADD.    Vitals:   Vitals:   04/21/18 1955 04/22/18 0517 04/22/18 0731 04/22/18 1332  BP: (!) 148/62 119/76  (!) 142/54  Pulse: 73 (!) 28 83 70  Resp: 18 15  18   Temp: (!) 97.4 F (36.3 C) 98 F (36.7 C)  98.6 F (37 C)  TempSrc: Oral Oral  Oral  SpO2: 100% 100%  99%  Weight:      Height:        Wt Readings from Last 3 Encounters:  04/20/18 85.3 kg  04/12/18 82.6 kg  03/06/18 80.4 kg     Intake/Output Summary (Last 24 hours) at 04/22/2018 1539 Last data filed at 04/22/2018 0949 Gross per 24 hour  Intake 120 ml  Output  350 ml  Net -230 ml    Physical Exam:   GENERAL: Pleasant-appearing in no apparent distress.  HEAD, EYES, EARS, NOSE AND THROAT: Atraumatic, normocephalic. Extraocular muscles are intact. Pupils equal and reactive to light. Sclerae anicteric. No conjunctival injection. No oro-pharyngeal erythema.  NECK: Supple. There is no jugular venous distention. No bruits, no lymphadenopathy, no thyromegaly.  HEART: Regular rate and rhythm,. No murmurs, no rubs, no clicks.  LUNGS: Clear to auscultation bilaterally. No rales or rhonchi. No wheezes.  ABDOMEN: Soft, flat, nontender, nondistended. Has good bowel sounds. No hepatosplenomegaly appreciated.  EXTREMITIES: No evidence of any cyanosis, clubbing, or peripheral edema.  +2 pedal and radial pulses bilaterally.  NEUROLOGIC: The patient is alert, awake, and oriented x3 with no focal motor or sensory deficits appreciated bilaterally.  SKIN: Moist and warm with no rashes appreciated.  Psych: Not anxious, depressed LN: No inguinal LN enlargement    Antibiotics   Anti-infectives (From admission, onward)   None      Medications   Scheduled Meds: . aspirin EC  81 mg Oral Daily  . atorvastatin  10 mg Oral Daily  . clopidogrel  75 mg Oral Daily  . DULoxetine  20 mg Oral Daily  . enoxaparin (LOVENOX) injection  40 mg Subcutaneous Q24H  . furosemide  20 mg Intravenous Daily  . gabapentin  100 mg Oral BID  . hydrALAZINE  25 mg Oral TID  . hydrochlorothiazide  25 mg Oral Daily  . irbesartan  150 mg Oral Daily  . latanoprost  1 drop Both Eyes QHS  . mouth rinse  15 mL Mouth Rinse BID  . metFORMIN  500 mg Oral Q breakfast  . pantoprazole  40 mg Oral Daily  . propranolol ER  80 mg Oral Daily  . [START ON 04/23/2018] testosterone  1 patch Transdermal Daily   Continuous Infusions: PRN Meds:.acetaminophen **OR** acetaminophen, LORazepam, ondansetron **OR** ondansetron (ZOFRAN) IV   Data Review:   Micro Results No results found for this or any  previous visit (from the past 240 hour(s)).  Radiology Reports Dg Chest 2 View  Result Date: 04/20/2018 CLINICAL DATA:  Weakness EXAM: CHEST - 2 VIEW COMPARISON:  12/17/2017 FINDINGS: Cardiomegaly with vascular congestion. Mild elevation of the right hemidiaphragm, stable. No edema. Bibasilar atelectasis. No visible effusions. No acute bony abnormality. IMPRESSION: Cardiomegaly with vascular congestion.  Bibasilar atelectasis. Electronically Signed   By: Rolm Baptise M.D.   On: 04/20/2018 16:28   Ct Head Wo Contrast  Result Date: 04/20/2018 CLINICAL DATA:  Altered level of consciousness EXAM: CT HEAD WITHOUT CONTRAST TECHNIQUE: Contiguous axial images were obtained from the base of the skull through the vertex without intravenous contrast. COMPARISON:  MRI brain dated 05/18/2017 FINDINGS: Brain: No evidence of acute infarction, hemorrhage, hydrocephalus, extra-axial collection or mass lesion/mass effect. Subcortical white matter and periventricular small vessel ischemic changes. Vascular: Intracranial atherosclerosis. Skull: Normal. Negative for fracture or focal lesion. Sinuses/Orbits: The visualized paranasal sinuses are essentially clear. The mastoid air cells are unopacified. Other: None. IMPRESSION: No evidence of acute intracranial abnormality. Small vessel ischemic changes. Electronically Signed   By: Julian Hy M.D.   On: 04/20/2018 16:32     CBC Recent Labs  Lab 04/20/18 1556 04/21/18 0253  WBC 6.8 7.2  HGB 10.4* 9.3*  HCT 33.9* 29.0*  PLT 260 235  MCV 100.9* 97.3  MCH 31.0 31.2  MCHC 30.7 32.1  RDW 12.0 12.1  LYMPHSABS 0.9  --   MONOABS 0.6  --   EOSABS 0.1  --   BASOSABS 0.1  --     Chemistries  Recent Labs  Lab 04/20/18 1556 04/21/18 0253  NA 140 140  K 4.2 3.3*  CL 103 101  CO2 27 32  GLUCOSE 268* 148*  BUN 20 21  CREATININE 0.93 1.00  CALCIUM 9.1 9.1  AST 22  --   ALT 12  --   ALKPHOS 64  --   BILITOT 0.8  --     ------------------------------------------------------------------------------------------------------------------ estimated creatinine clearance is 57.4 mL/min (by C-G formula based on SCr of 1 mg/dL). ------------------------------------------------------------------------------------------------------------------  No results for input(s): HGBA1C in the last 72 hours. ------------------------------------------------------------------------------------------------------------------ No results for input(s): CHOL, HDL, LDLCALC, TRIG, CHOLHDL, LDLDIRECT in the last 72 hours. ------------------------------------------------------------------------------------------------------------------ Recent Labs    04/20/18 2127  TSH 0.535   ------------------------------------------------------------------------------------------------------------------ Recent Labs    04/20/18 2044  VITAMINB12 521    Coagulation profile No results for input(s): INR, PROTIME in the last 168 hours.  No results for input(s): DDIMER in the last 72 hours.  Cardiac Enzymes Recent Labs  Lab 04/20/18 2127 04/21/18 0253 04/21/18 0921  TROPONINI 0.06* 0.07* 0.08*   ------------------------------------------------------------------------------------------------------------------ Invalid input(s): POCBNP    Assessment & Plan   Todd [name]  is a 84 y.o. male with a known history of stroke, with minimal left-sided weakness and expressive aphasia, CAD, arthritis, diabetes, GERD, hypertension, iron deficiency anemia presents from home secondary to significant weakness this morning.  1. Weakness-  Seen by PT recommends skilled nursing facility However family wanted to take him home yesterday   2.  Acute diastolic CHF-with minimal exacerbation. -Chronically on 3 L oxygen.   -Patient's oxygen saturations dropped with ambulation I will give him higher dose Lasix  3.  Hypertension-patient on ARB,  hydrochlorothiazide, hydralazine.  4.  Prior history of stroke-with minimal left-sided weakness, expressive aphasia.  No changes. -Continue aspirin, Plavix and statin  5.  GERD-PPI  6.  Slightly elevated troponin-secondary to CHF.  Check BNP  7.  Essential tremors-more noticed on the left hand. -Continue propranolol  8.  Silent aspiration per swallow study nectar thick diet recommended     Code Status Orders  (From admission, onward)         Start     Ordered   04/20/18 2123  Full code  Continuous     04/20/18 2122        Code Status History    Date Active Date Inactive Code Status Order ID Comments User Context   05/18/2017 0303 05/21/2017 1928 Full Code [id number]  Lance Coon, MD Inpatient   03/17/2017 0419 03/17/2017 2109 Full Code [id number]  Saundra Shelling, MD ED   03/05/2015 0019 03/05/2015 1507 Full Code [id number]  Lance Coon, MD Inpatient    Advance Directive Documentation     Most Recent Value  Type of Advance Directive  Healthcare Power of Attorney  Pre-existing out of facility DNR order (yellow form or pink MOST form)  -  "MOST" Form in Place?  -     I attempted to call daughter and left a message no reply patient's disposition is not good      Consults none  DVT Prophylaxis  Lovenox  Lab Results  Component Value Date   PLT 235 04/21/2018     Time Spent in minutes 35 minutes  Greater than 50% of time spent in care coordination and counseling patient regarding the condition and plan of care.   Dustin Flock M.D on 04/22/2018 at 3:39 PM  Between 7am to 6pm - Pager - [phone]  After 6pm go to [email or web address] - Proofreader  [employer]   Office  [phone]

## 2018-04-23 LAB — VITAMIN D 25 HYDROXY (VIT D DEFICIENCY, FRACTURES): Vit D, 25-Hydroxy: 33.1 ng/mL (ref 30.0–100.0)

## 2018-04-23 LAB — ZINC: Zinc: 50 ug/dL — ABNORMAL LOW (ref 56–134)

## 2018-04-23 NOTE — Progress Notes (Signed)
Advanced care plan.  Purpose of the Encounter: CODE STATUS  Parties in Attendance: Discussed with patient's daughter Sharion Dove [phone]  Patient's Decision Capacity: Not intact  Subjective/Patient's story: Patient is 84 year old with previous history of stroke admitted with generalized weakness also has chronic diastolic CHF chronic respiratory failure presented with weakness   Objective/Medical story  I discussed with the patient's healthcare power of attorney regarding overall poor prognosis advanced age now with dysphasia recommended DNR status  Goals of care determination:  Daughter states that she will discuss with other family members and decide on the CODE STATUS they will let his primary care provider now   CODE STATUS: Full code for now   Time spent discussing advanced care planning: 16 minutes

## 2018-04-23 NOTE — Progress Notes (Signed)
Pt out via w/c at this time with  Children. 02 at 3 l [place].chronic instructions with dtr  And son diet / meds activity and f/u discussed. Verbalizes understanding. Sl x 2 d/cd/

## 2018-04-23 NOTE — Progress Notes (Signed)
Speech Language Pathology Treatment: Dysphagia  Patient Details Name: Todd [name] MRN: [id number] DOB: 01/29/1931 Today's Date: 04/23/2018 Time: 1210-1300 SLP Time Calculation (min) (ACUTE ONLY): 50 min  Assessment / Plan / Recommendation Clinical Impression  Pt seen for ongoing toleration of dysphagia diet; education w/ this diet and the results of the MBSS from yesterday. Son present; Daughter arrived later post session. NSG reported good toleration of the diet this morning. Pt appeared eager for Lunch meal. 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 He has received Outpatient ST services for Language skills.  Family was concerned re: pt's coughing at times during meals, congestion or "drainage", and expressed this during this admission. Noted pt's CXR at admission. MBSS was completed w/ oropharyngeal phase dysphagia w/ Silent aspiration dx'd. See full results and explanation in report. Pt was recommended to be on a dysphagia diet w/ Nectar consistency liquids; aspiration precautions.  Pt appeared to tolerate bites and ~3 ozs of the Nectar consistency liquids via cup/straw during this session. No overt s/s of aspiration were noted immediate or for ~5+ mins post bites/sips. Oral phase was min slower but given time, pt cleared appropriately. Suspect oral phase coordination is hampered by the Apraxia. Pt fed self w/ setup assist.  Much time was spent w/ family on Education d/t the MBSS results and the downgrade of diet consistency to a Dysphagia diet. Thorough education given on Dysphagia and risk for Pulmonary issues d/t aspiration; recommended diet consistency including NECTAR consistency liquids; thickening of liquids and use of powder thickener vs pre-thickened drinks; food and drink options and preparation; aspiration precautions. Recommended Pills be swallowed whole in Puree or w/ Nectar liquids. Both Son and Daughter gave verbal agreement. Pt is  discharging home w/ family today. ST services will be available for further education if needed while admitted. NSG updated.      HPI HPI: Pt is a 84 y.o. 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 Due to the Expressive Aphasia, report comes from family who stated patient has been complaining of weakness on and off and was noted to have iron deficiency anemia and is started on supplements.  He was doing well, ambulating with a walker at baseline but today unable to get out of bed.  Denies any chest pain, complains of some dyspnea.  Chronically on 3 L oxygen.  Pt's MBSS was completed yesterday w/ oropharyngeal phase dysphagia and aspiration of thin liquids (SILENT) dx'd. Pt is currently recommended to be on a Dysphagia level 3(mech soft) diet w/ NECTAR consistency liquids.       SLP Plan  Continue with current plan of care(while admitted)       Recommendations  Diet recommendations: Dysphagia 3 (mechanical soft);Nectar-thick liquid Liquids provided via: Cup;Straw Medication Administration: Whole meds with puree(or Whole w/ Nectar liquids) Supervision: Patient able to self feed;Intermittent supervision to cue for compensatory strategies Compensations: Minimize environmental distractions;Slow rate;Small sips/bites;Lingual sweep for clearance of pocketing;Multiple dry swallows after each bite/sip;Follow solids with liquid(Strong Cough at end of meals to aid clearing) Postural Changes and/or Swallow Maneuvers: Seated upright 90 degrees;Upright 30-60 min after meal                General recommendations: (f/u by Palliative Care svcs for [place]; Dietician f/u) Oral Care Recommendations: Oral care BID;Patient independent with  oral care;Staff/trained caregiver to provide oral care Follow up  Recommendations: Home health SLP(if discharging home) SLP Visit Diagnosis: Dysphagia, oropharyngeal phase (R13.12)(suspect chronic from Pontine CVA in 05/2017) Plan: Continue with current plan of care(while admitted)       [place], [place], CCC-SLP Olegario Emberson 04/23/2018, 2:58 PM

## 2018-04-23 NOTE — Plan of Care (Signed)

## 2018-04-23 NOTE — Discharge Summary (Signed)
[facility] [facility] [place] NAME: Todd [name]    MR#:  [id number]  DATE OF BIRTH:  1931-05-19  DATE OF ADMISSION:  04/20/2018 ADMITTING PHYSICIAN: Todd Lighter, MD  DATE OF DISCHARGE: No discharge date for patient encounter.  PRIMARY CARE PHYSICIAN: Todd Maple, MD    ADMISSION DIAGNOSIS:  Weakness [R53.1] Elevated troponin [R79.89]  DISCHARGE DIAGNOSIS:  Generalized weakness Acute hypoxic respiratory failure on chronic respiratory failure Acute diastolic CHF on chronic diastolic CHF Essential hypertension Previous stroke Dysphasia GERD Elevated troponin due to demand ischemia Essential tremors     SECONDARY DIAGNOSIS:   Past Medical History:  Diagnosis Date  . Anxiety   . Aortic regurgitation    a. 03/2017 Echo: Mod AI; b. 05/2017 Echo: Triv AI.  [name] Kitchen Arthritis    osteoarthritis-left knee  . CAD (coronary artery disease)    a. Reported h/o cath ~ 2008 - ? small vessel dzs. No PCI performed; b. 03/2017 MV: EF 45-54%, no ischemia/infarct.  . Depression   . Diabetes mellitus without complication ([place])   . Diastolic dysfunction    a. 03/2017 Echo: EF 55-65%; b. 05/2017 Echo: EF 55-60%, no rwma, Gr1 DD. Triv AI.  Mildly dil LA.  . ED (erectile dysfunction)   . Elevated troponin    a. chronic mild elevation - 0.03->0.04.  [name] Kitchen GERD (gastroesophageal reflux disease)   . Glaucoma   . Hyperlipidemia   . Hypertension   . Iron deficiency anemia due to chronic blood loss   . Left pontine stroke w/ cerebrovascular disease(HCC)    a. 05/2017 MRI/A: acute to subacute L pontine infarct. Chronic left thalamic lacunar infarct. Severe basilar artery stenosis w/ radiographic string sign of the distal 1/3. Mild to moderate L PCA P2 segment stenosis; b. 05/2017 Carotid U/S: <50% bilat ICA stenosis.  . Lumbago    Lumbosacral Neuritis  . Prostate cancer [facility])     HOSPITAL COURSE:  Todd [name]  is a 84 y.o. male with a known  history of stroke, with minimal left-sided weakness and expressive aphasia, CAD, arthritis, diabetes, GERD, hypertension, iron deficiency anemia presents from home secondary to significant weakness this morning.  *Weakness Most likely secondary to chronic deconditioning, history of stroke aphasia/hemiparesis Patient and the patient's family do not want rehab-are interested in home health physical therapy going forward, given B12 injection x1, TSH was normal  *History of cerebrovascular accident with aphasia/hemiparesis Patient was seen by speech and recommended nectar thick liquids I have instructed the family regarding this   * Acute diastolic CHF-with minimal exacerbation  treated with IV Lasix  *Acute hypoxic respiratory failure on chronic 3 L of oxygen and had a CT of the chest showed no PE this likely related to poor functional status and atelectasis I recommended family to increase oxygen to 4 L with activity  *Hypertension Controlled on current regiment   DISCHARGE CONDITIONS:   stable  CONSULTS OBTAINED:    DRUG ALLERGIES:   Allergies  Allergen Reactions  . Amlodipine     Lower extremity swelling at 10 mg.  . Fluoxetine Other (See Comments)    headache  . Meloxicam Other (See Comments)    Other reaction(s): Dizziness Nervousness.     DISCHARGE MEDICATIONS:   Allergies as of 04/23/2018      Reactions   Amlodipine    Lower extremity swelling at 10 mg.   Fluoxetine Other (See Comments)   headache   Meloxicam Other (See Comments)   Other  reaction(s): Dizziness Nervousness.      Medication List    TAKE these medications   ACCU-CHEK AVIVA PLUS w/Device Kit Use to check sugar levels x 2 daily   accu-chek soft touch lancets Use as instructed   aspirin EC 81 MG tablet Take 81 mg by mouth daily.   atorvastatin 10 MG tablet Commonly known as:  LIPITOR Take 1 tablet (10 mg total) by mouth daily.   clopidogrel 75 MG tablet Commonly known as:   PLAVIX Take 1 tablet (75 mg total) by mouth daily.   DULoxetine 20 MG capsule Commonly known as:  CYMBALTA   gabapentin 100 MG capsule Commonly known as:  NEURONTIN Take 100 mg by mouth 2 (two) times daily. Once a day at night   hydrALAZINE 25 MG tablet Commonly known as:  APRESOLINE Take 1 tablet (25 mg total) by mouth 3 (three) times daily.   hydrochlorothiazide 25 MG tablet Commonly known as:  HYDRODIURIL Take 1 tablet (25 mg total) by mouth daily.   latanoprost 0.005 % ophthalmic solution Commonly known as:  XALATAN Place 1 drop into both eyes at bedtime.   LORazepam 0.5 MG tablet Commonly known as:  ATIVAN Take 1 tablet (0.5 mg total) by mouth 2 (two) times daily as needed for anxiety.   metFORMIN 500 MG tablet Commonly known as:  GLUCOPHAGE Take 1 tablet (500 mg total) by mouth daily with breakfast.   naproxen sodium 220 MG tablet Commonly known as:  ALEVE Take 220 mg by mouth 2 (two) times daily as needed.   pantoprazole 40 MG tablet Commonly known as:  PROTONIX Take 1 tablet (40 mg total) by mouth daily.   propranolol ER 80 MG 24 hr capsule Commonly known as:  INDERAL LA Take 1 capsule (80 mg total) by mouth daily.   telmisartan 80 MG tablet Commonly known as:  MICARDIS Take 1 tablet (80 mg total) by mouth daily.   testosterone 4 MG/24HR Pt24 patch Commonly known as:  ANDRODERM Place 1 patch onto the skin daily.   TYLENOL 8 HOUR ARTHRITIS PAIN 650 MG CR tablet Generic drug:  acetaminophen Take 650 mg by mouth every 8 (eight) hours as needed for pain.        DISCHARGE INSTRUCTIONS:      If you experience worsening of your admission symptoms, develop shortness of breath, life threatening emergency, suicidal or homicidal thoughts you must seek medical attention immediately by calling 911 or calling your MD immediately  if symptoms less severe.  You Must read complete instructions/literature along with all the possible adverse reactions/side  effects for all the Medicines you take and that have been prescribed to you. Take any new Medicines after you have completely understood and accept all the possible adverse reactions/side effects.   Please note  You were cared for by a hospitalist during your hospital stay. If you have any questions about your discharge medications or the care you received while you were in the hospital after you are discharged, you can call the unit and asked to speak with the hospitalist on call if the hospitalist that took care of you is not available. Once you are discharged, your primary care physician will handle any further medical issues. Please note that NO REFILLS for any discharge medications will be authorized once you are discharged, as it is imperative that you return to your primary care physician (or establish a relationship with a primary care physician if you do not have one) for your aftercare needs so  that they can reassess your need for medications and monitor your lab values.    Today   CHIEF COMPLAINT:   Chief Complaint  Patient presents with  . Weakness    HISTORY OF PRESENT ILLNESS:   84 y.o. male with a known history of stroke, with minimal left-sided weakness and expressive aphasia, CAD, arthritis, diabetes, GERD, hypertension, iron deficiency anemia presents from home secondary to significant weakness this morning. Due to his expressive aphasia, patient is unable to provide information.  Most of the history is obtained from his daughters at bedside.  According to them, patient has been complaining of weakness on and off and was noted to have iron deficiency anemia and is started on supplements.  He was doing well, ambulating with a walker at baseline but today unable to get out of bed.  Denies any chest pain, complains of some dyspnea.  Chronically on 3 L oxygen.  Has chronic low back pain, no abdominal pain today, no nausea or vomiting.  Denies any fevers or chills. Work-up here did  not reveal any infection, urine analysis is normal.  CT head without any acute findings.  Chest x-ray does have some vascular congestion.  He is being admitted for his weakness.   VITAL SIGNS:  Blood pressure (!) 142/54, pulse 60, temperature 98 F (36.7 C), temperature source Oral, resp. rate 19, height _0  (1.727 m), weight 85.3 kg, SpO2 99 %.  I/O:    Intake/Output Summary (Last 24 hours) at 04/23/2018 1356 Last data filed at 04/23/2018 0956 Gross per 24 hour  Intake 120 ml  Output 825 ml  Net -705 ml    PHYSICAL EXAMINATION:  GENERAL:  84 y.o.-year-old patient lying in the bed with no acute distress.  EYES: Pupils equal, round, reactive to light and accommodation. No scleral icterus. Extraocular muscles intact.  HEENT: Head atraumatic, normocephalic. Oropharynx and nasopharynx clear.  NECK:  Supple, no jugular venous distention. No thyroid enlargement, no tenderness.  LUNGS: Normal breath sounds bilaterally, no wheezing, rales,rhonchi or crepitation. No use of accessory muscles of respiration.  CARDIOVASCULAR: S1, S2 normal. No murmurs, rubs, or gallops.  ABDOMEN: Soft, non-tender, non-distended. Bowel sounds present. No organomegaly or mass.  EXTREMITIES: No pedal edema, cyanosis, or clubbing.  NEUROLOGIC: Cranial nerves II through XII are intact. Muscle strength 5/5 in all extremities. Sensation intact. Gait not checked.  PSYCHIATRIC: The patient is alert and oriented x 3.  SKIN: No obvious rash, lesion, or ulcer.   DATA REVIEW:   CBC Recent Labs  Lab 04/21/18 0253  WBC 7.2  HGB 9.3*  HCT 29.0*  PLT 235    Chemistries  Recent Labs  Lab 04/20/18 1556 04/21/18 0253  NA 140 140  K 4.2 3.3*  CL 103 101  CO2 27 32  GLUCOSE 268* 148*  BUN 20 21  CREATININE 0.93 1.00  CALCIUM 9.1 9.1  AST 22  --   ALT 12  --   ALKPHOS 64  --   BILITOT 0.8  --     Cardiac Enzymes Recent Labs  Lab 04/21/18 0921  TROPONINI 0.08*    Microbiology Results  Results for  orders placed or performed in visit on 01/09/17  Microscopic Examination     Status: Abnormal   Collection Time: 01/09/17  1:59 PM  Result Value Ref Range Status   WBC, UA 0-5 0 - 5 /hpf Final   RBC, UA 0-2 0 - 2 /hpf Final   Epithelial Cells (non renal) 0-10 0 -  10 /hpf Final   Renal Epithel, UA 0-10 (A) None seen /hpf Final   Mucus, UA Present (A) Not Estab. Final   Bacteria, UA None seen None seen/Few Final    RADIOLOGY:  Ct Angio Chest Pe W Or Wo Contrast  Result Date: 04/22/2018 CLINICAL DATA:  84 year old male with shortness of breath, hypoxia with exertion. EXAM: CT ANGIOGRAPHY CHEST WITH CONTRAST TECHNIQUE: Multidetector CT imaging of the chest was performed using the standard protocol during bolus administration of intravenous contrast. Multiplanar CT image reconstructions and MIPs were obtained to evaluate the vascular anatomy. CONTRAST:  20m OMNIPAQUE IOHEXOL 350 MG/ML SOLN COMPARISON:  Chest radiographs 04/20/2018 and earlier. Lumbar MRI 04/11/2017. FINDINGS: Cardiovascular: Excellent contrast bolus timing in the pulmonary arterial tree. Mild respiratory motion. No focal filling defect identified in the pulmonary arteries to suggest acute pulmonary embolism. Mild cardiomegaly. Widespread calcified coronary artery atherosclerosis and/or stents. No pericardial effusion. Lesser Calcified aortic atherosclerosis. Mediastinum/Nodes: No mediastinal lymphadenopathy. Lungs/Pleura: Major airways are patent. There is confluent opacity throughout the medial aspect of the right lower lobe from the superior segment to the medial basal segment. A portion of this enhances such as due to atelectasis, but some of it appears nonenhancing. There are some air bronchograms. There is no associated pleural effusion. There is some nearby patchy opacity in the posterior right costophrenic angle. There is curvilinear atelectasis or scarring in the medial segment of the right middle lobe. There is minor left lower  lobe atelectasis. Upper Abdomen: The partially visible gallbladder appears distended (10 centimeters in length), but otherwise within normal limits. Negative visible liver, spleen, adrenal glands. There is some oral contrast in the stomach. Musculoskeletal: Widespread thoracic spine ankylosis related to flowing endplate osteophytes or syndesmophytes. The lumbar spine did not appear similarly ankylosed in 2018. Bulky midthoracic osteophytosis with vacuum phenomena. No acute osseous abnormality identified. Advanced degenerative changes at the glenohumeral joints. Review of the MIP images confirms the above findings. IMPRESSION: 1. Negative for acute pulmonary embolus. 2. Confluent opacity in the medial aspect of the right lower lobe is nonspecific and might represent a combination of atelectasis and pneumonia. No pleural effusion. Has the patient had any prior radiation treatment to the right chest? 3. No other acute pulmonary process. 4. Distended gallbladder. If there is right upper quadrant pain then follow-up ultrasound would be recommended. 5. Calcified coronary artery atherosclerosis and mild cardiomegaly. 6. Widespread thoracic spine ankylosis, favor Diffuse idiopathic skeletal hyperostosis (DISH). Electronically Signed   By: HGenevie AnnM.D.   On: 04/22/2018 17:27    EKG:   Orders placed or performed in visit on 04/20/18  . EKG 12-Lead  . EKG 12-Lead  . EKG 12-Lead      Management plans discussed with the patient, family and they are in agreement.  CODE STATUS:     Code Status Orders  (From admission, onward)         Start     Ordered   04/20/18 2123  Full code  Continuous     04/20/18 2122        Code Status History    Date Active Date Inactive Code Status Order ID Comments User Context   05/18/2017 0303 05/21/2017 1928 Full Code [id number] WLance Coon MD Inpatient   03/17/2017 0419 03/17/2017 2109 Full Code [id number] PSaundra Shelling MD ED   03/05/2015 0019 03/05/2015 1507 Full  Code [id number] WLance Coon MD Inpatient    Advance Directive Documentation     Most Recent Value  Type of Advance Directive  Healthcare Power of Attorney  Pre-existing out of facility DNR order (yellow form or pink MOST form)  -  "MOST" Form in Place?  -      TOTAL TIME TAKING CARE OF THIS PATIENT: 40 minutes.    Dustin Flock M.D on 04/23/2018 at 1:56 PM  Between 7am to 6pm - Pager - [phone]  After 6pm go to [email or web address] - password [email or web address] [facility]  Office  [phone]  CC: Primary care physician; Todd Maple, MD   Note: This dictation was prepared with Dragon dictation along with smaller phrase technology. Any transcriptional errors that result from this process are unintentional.

## 2018-04-23 NOTE — Care Management (Signed)
The family of Todd [name] desires to take home and not go to a skilled nursing facility.  Telephone call to daughter, [name] [phone]). Discussed home health agencies. [facility]. [name], [facility] representative updated. Family would like to transport per private car.  Family will bring oxygen tank to the home to aid in transportation. [name] RN MSN CCM Care Management [phone]

## 2018-04-24 ENCOUNTER — Telehealth: Payer: Self-pay

## 2018-04-24 DIAGNOSIS — E785 Hyperlipidemia, unspecified: Secondary | ICD-10-CM | POA: Diagnosis not present

## 2018-04-24 DIAGNOSIS — G8929 Other chronic pain: Secondary | ICD-10-CM | POA: Diagnosis not present

## 2018-04-24 DIAGNOSIS — H409 Unspecified glaucoma: Secondary | ICD-10-CM | POA: Diagnosis not present

## 2018-04-24 DIAGNOSIS — D500 Iron deficiency anemia secondary to blood loss (chronic): Secondary | ICD-10-CM | POA: Diagnosis not present

## 2018-04-24 DIAGNOSIS — Z96653 Presence of artificial knee joint, bilateral: Secondary | ICD-10-CM | POA: Diagnosis not present

## 2018-04-24 DIAGNOSIS — Z8546 Personal history of malignant neoplasm of prostate: Secondary | ICD-10-CM | POA: Diagnosis not present

## 2018-04-24 DIAGNOSIS — Z7982 Long term (current) use of aspirin: Secondary | ICD-10-CM | POA: Diagnosis not present

## 2018-04-24 DIAGNOSIS — M545 Low back pain: Secondary | ICD-10-CM | POA: Diagnosis not present

## 2018-04-24 DIAGNOSIS — I110 Hypertensive heart disease with heart failure: Secondary | ICD-10-CM | POA: Diagnosis not present

## 2018-04-24 DIAGNOSIS — I2510 Atherosclerotic heart disease of native coronary artery without angina pectoris: Secondary | ICD-10-CM | POA: Diagnosis not present

## 2018-04-24 DIAGNOSIS — I351 Nonrheumatic aortic (valve) insufficiency: Secondary | ICD-10-CM | POA: Diagnosis not present

## 2018-04-24 DIAGNOSIS — K219 Gastro-esophageal reflux disease without esophagitis: Secondary | ICD-10-CM | POA: Diagnosis not present

## 2018-04-24 DIAGNOSIS — I5031 Acute diastolic (congestive) heart failure: Secondary | ICD-10-CM | POA: Diagnosis not present

## 2018-04-24 DIAGNOSIS — F419 Anxiety disorder, unspecified: Secondary | ICD-10-CM | POA: Diagnosis not present

## 2018-04-24 DIAGNOSIS — G250 Essential tremor: Secondary | ICD-10-CM | POA: Diagnosis not present

## 2018-04-24 DIAGNOSIS — F329 Major depressive disorder, single episode, unspecified: Secondary | ICD-10-CM | POA: Diagnosis not present

## 2018-04-24 DIAGNOSIS — E119 Type 2 diabetes mellitus without complications: Secondary | ICD-10-CM | POA: Diagnosis not present

## 2018-04-24 DIAGNOSIS — I69354 Hemiplegia and hemiparesis following cerebral infarction affecting left non-dominant side: Secondary | ICD-10-CM | POA: Diagnosis not present

## 2018-04-24 DIAGNOSIS — I69320 Aphasia following cerebral infarction: Secondary | ICD-10-CM | POA: Diagnosis not present

## 2018-04-24 DIAGNOSIS — Z7984 Long term (current) use of oral hypoglycemic drugs: Secondary | ICD-10-CM | POA: Diagnosis not present

## 2018-04-24 DIAGNOSIS — Z7902 Long term (current) use of antithrombotics/antiplatelets: Secondary | ICD-10-CM | POA: Diagnosis not present

## 2018-04-24 DIAGNOSIS — Z9981 Dependence on supplemental oxygen: Secondary | ICD-10-CM | POA: Diagnosis not present

## 2018-04-24 DIAGNOSIS — Z87891 Personal history of nicotine dependence: Secondary | ICD-10-CM | POA: Diagnosis not present

## 2018-04-24 NOTE — Telephone Encounter (Signed)
Transition Care Management Follow-up Telephone Call  Date of discharge and from where: [facility] on 04/23/2018  How have you been since you were released from the hospital? Per daughter she is still weak but isn't having any other symptoms that are concerning  Any questions or concerns? No   Items Reviewed:  Did the pt receive and understand the discharge instructions provided? Yes   Medications obtained and verified? no changes  Any new allergies since your discharge? No   Dietary orders reviewed? Yes  Do you have support at home? Yes , brother and wife live with patient  Other (ie: DME, Home Health, etc) N/A  Functional Questionnaire: (I = Independent and D = Dependent) ADL's: I w/ assist  Bathing/Dressing- I w/ assist   Meal Prep- I w/ assist  Eating- I  Maintaining continence- I  Transferring/Ambulation- Walker  Managing Meds- I w/ assist   Follow up appointments reviewed:    PCP Hospital f/u appt confirmed? Informed patient to called [facility] to schedule f/u with Dr[facility] f/u appt confirmed? N/A  Are transportation arrangements needed? No   If their condition worsens, is the pt aware to call  their PCP or go to the ED? Yes  Was the patient provided with contact information for the PCP's office or ED? Yes  Was the pt encouraged to call back with questions or concerns? Yes

## 2018-04-25 ENCOUNTER — Other Ambulatory Visit: Payer: Self-pay

## 2018-04-25 ENCOUNTER — Encounter: Payer: PPO | Admitting: Speech Pathology

## 2018-04-25 ENCOUNTER — Encounter: Payer: Self-pay | Admitting: Family Medicine

## 2018-04-25 ENCOUNTER — Ambulatory Visit (INDEPENDENT_AMBULATORY_CARE_PROVIDER_SITE_OTHER): Payer: PPO | Admitting: Family Medicine

## 2018-04-25 VITALS — BP 112/67 | HR 76 | Temp 97.5°F | Ht 67.0 in | Wt 185.0 lb

## 2018-04-25 DIAGNOSIS — J9601 Acute respiratory failure with hypoxia: Secondary | ICD-10-CM | POA: Diagnosis not present

## 2018-04-25 DIAGNOSIS — I69391 Dysphagia following cerebral infarction: Secondary | ICD-10-CM | POA: Diagnosis not present

## 2018-04-25 DIAGNOSIS — I69359 Hemiplegia and hemiparesis following cerebral infarction affecting unspecified side: Secondary | ICD-10-CM

## 2018-04-25 DIAGNOSIS — E538 Deficiency of other specified B group vitamins: Secondary | ICD-10-CM | POA: Insufficient documentation

## 2018-04-25 DIAGNOSIS — I10 Essential (primary) hypertension: Secondary | ICD-10-CM

## 2018-04-25 DIAGNOSIS — R531 Weakness: Secondary | ICD-10-CM

## 2018-04-25 DIAGNOSIS — I5031 Acute diastolic (congestive) heart failure: Secondary | ICD-10-CM

## 2018-04-25 DIAGNOSIS — F322 Major depressive disorder, single episode, severe without psychotic features: Secondary | ICD-10-CM | POA: Diagnosis not present

## 2018-04-25 DIAGNOSIS — G8190 Hemiplegia, unspecified affecting unspecified side: Secondary | ICD-10-CM | POA: Insufficient documentation

## 2018-04-25 DIAGNOSIS — R4701 Aphasia: Secondary | ICD-10-CM

## 2018-04-25 DIAGNOSIS — I6930 Unspecified sequelae of cerebral infarction: Secondary | ICD-10-CM | POA: Diagnosis not present

## 2018-04-25 MED ORDER — FOOD THICKENER (THICKENUP CLEAR): ORAL | 12 refills | Status: DC

## 2018-04-25 MED ORDER — DULOXETINE HCL 60 MG PO CPEP: 60.0000 mg | ORAL_CAPSULE | Freq: Every day | ORAL | 1 refills | Status: DC

## 2018-04-25 NOTE — Assessment & Plan Note (Signed)
Stable right now. Continue to monitor. Continue current regimen.

## 2018-04-25 NOTE — Patient Outreach (Signed)
[name]) Care Management  [place]  04/25/2018  [name] 04-Jan-1931 [id number]  84 year old male outreached by [facility] services for a 30 day post discharge medication review.  PMHx includes, but not limited to, hypertension, coronary artery disease, heart failure, GERD, diabetes, h/o prostate cancer, depression and hyperlipidemia.  Unsuccessful telephone call attempt # 1 to [name].  HIPAA compliant voicemail left requesting a return call.  Plan:  Outreach attempt in 3-4 business days.   Joetta Manners, PharmD Clinical Pharmacist [place] [phone]

## 2018-04-25 NOTE — Assessment & Plan Note (Signed)
Significant deficits with aphasia, dysphagia and hemiplegia. Will continue to work with PT and speech. Call with any concerns.

## 2018-04-25 NOTE — Patient Instructions (Signed)
Bupropion extended-release tablets (Depression/Mood Disorders) What is this medicine? BUPROPION (byoo PROE pee on) is used to treat depression. This medicine may be used for other purposes; ask your health care provider or pharmacist if you have questions. COMMON BRAND NAME(S): Aplenzin, Budeprion XL, Forfivo XL, Wellbutrin XL What should I tell my health care provider before I take this medicine? They need to know if you have any of these conditions: -an eating disorder, such as anorexia or bulimia -bipolar disorder or psychosis -diabetes or high blood sugar, treated with medication -glaucoma -head injury or brain tumor -heart disease, previous heart attack, or irregular heart beat -high blood pressure -kidney or liver disease -seizures (convulsions) -suicidal thoughts or a previous suicide attempt -Tourette's syndrome -weight loss -an unusual or allergic reaction to bupropion, other medicines, foods, dyes, or preservatives -breast-feeding -pregnant or trying to become pregnant How should I use this medicine? Take this medicine by mouth with a glass of water. Follow the directions on the prescription label. You can take it with or without food. If it upsets your stomach, take it with food. Do not crush, chew, or cut these tablets. This medicine is taken once daily at the same time each day. Do not take your medicine more often than directed. Do not stop taking this medicine suddenly except upon the advice of your doctor. Stopping this medicine too quickly may cause serious side effects or your condition may worsen. A special MedGuide will be given to you by the pharmacist with each prescription and refill. Be sure to read this information carefully each time. Talk to your pediatrician regarding the use of this medicine in children. Special care may be needed. Overdosage: If you think you have taken too much of this medicine contact a poison control center or emergency room at once. NOTE:  This medicine is only for you. Do not share this medicine with others. What if I miss a dose? If you miss a dose, skip the missed dose and take your next tablet at the regular time. Do not take double or extra doses. What may interact with this medicine? Do not take this medicine with any of the following medications: -linezolid -MAOIs like Azilect, Carbex, Eldepryl, Marplan, Nardil, and Parnate -methylene blue (injected into a vein) -other medicines that contain bupropion like Zyban This medicine may also interact with the following medications: -alcohol -certain medicines for anxiety or sleep -certain medicines for blood pressure like metoprolol, propranolol -certain medicines for depression or psychotic disturbances -certain medicines for HIV or AIDS like efavirenz, lopinavir, nelfinavir, ritonavir -certain medicines for irregular heart beat like propafenone, flecainide -certain medicines for Parkinson's disease like amantadine, levodopa -certain medicines for seizures like carbamazepine, phenytoin, phenobarbital -cimetidine -clopidogrel -cyclophosphamide -digoxin -furazolidone -isoniazid -nicotine -orphenadrine -procarbazine -steroid medicines like prednisone or cortisone -stimulant medicines for attention disorders, weight loss, or to stay awake -tamoxifen -theophylline -thiotepa -ticlopidine -tramadol -warfarin This list may not describe all possible interactions. Give your health care provider a list of all the medicines, herbs, non-prescription drugs, or dietary supplements you use. Also tell them if you smoke, drink alcohol, or use illegal drugs. Some items may interact with your medicine. What should I watch for while using this medicine? Tell your doctor if your symptoms do not get better or if they get worse. Visit your doctor or health care professional for regular checks on your progress. Because it may take several weeks to see the full effects of this medicine, it  is important to continue your treatment as   prescribed by your doctor. Patients and their families should watch out for new or worsening thoughts of suicide or depression. Also watch out for sudden changes in feelings such as feeling anxious, agitated, panicky, irritable, hostile, aggressive, impulsive, severely restless, overly excited and hyperactive, or not being able to sleep. If this happens, especially at the beginning of treatment or after a change in dose, call your health care professional. Avoid alcoholic drinks while taking this medicine. Drinking large amounts of alcoholic beverages, using sleeping or anxiety medicines, or quickly stopping the use of these agents while taking this medicine may increase your risk for a seizure. Do not drive or use heavy machinery until you know how this medicine affects you. This medicine can impair your ability to perform these tasks. Do not take this medicine close to bedtime. It may prevent you from sleeping. Your mouth may get dry. Chewing sugarless gum or sucking hard candy, and drinking plenty of water may help. Contact your doctor if the problem does not go away or is severe. The tablet shell for some brands of this medicine does not dissolve. This is normal. The tablet shell may appear whole in the stool. This is not a cause for concern. What side effects may I notice from receiving this medicine? Side effects that you should report to your doctor or health care professional as soon as possible: -allergic reactions like skin rash, itching or hives, swelling of the face, lips, or tongue -breathing problems -changes in vision -confusion -elevated mood, decreased need for sleep, racing thoughts, impulsive behavior -fast or irregular heartbeat -hallucinations, loss of contact with reality -increased blood pressure -redness, blistering, peeling or loosening of the skin, including inside the mouth -seizures -suicidal thoughts or other mood  changes -unusually weak or tired -vomiting Side effects that usually do not require medical attention (report to your doctor or health care professional if they continue or are bothersome): -constipation -headache -loss of appetite -nausea -tremors -weight loss This list may not describe all possible side effects. Call your doctor for medical advice about side effects. You may report side effects to FDA at 1-800-FDA-1088. Where should I keep my medicine? Keep out of the reach of children. Store at room temperature between 15 and 30 degrees C (59 and 86 degrees F). Throw away any unused medicine after the expiration date. NOTE: This sheet is a summary. It may not cover all possible information. If you have questions about this medicine, talk to your doctor, pharmacist, or health care provider.  2018 [employer]/Gold Standard (2015-11-12 13:55:13) Mirtazapine tablets What is this medicine? MIRTAZAPINE (mir TAZ a peen) is used to treat depression. This medicine may be used for other purposes; ask your health care provider or pharmacist if you have questions. COMMON BRAND NAME(S): Remeron What should I tell my health care provider before I take this medicine? They need to know if you have any of these conditions: -bipolar disorder -glaucoma -kidney disease -liver disease -suicidal thoughts -an unusual or allergic reaction to mirtazapine, other medicines, foods, dyes, or preservatives -pregnant or trying to get pregnant -breast-feeding How should I use this medicine? Take this medicine by mouth with a glass of water. Follow the directions on the prescription label. Take your medicine at regular intervals. Do not take your medicine more often than directed. Do not stop taking this medicine suddenly except upon the advice of your doctor. Stopping this medicine too quickly may cause serious side effects or your condition may worsen. A special MedGuide will be  given to you by the pharmacist  with each prescription and refill. Be sure to read this information carefully each time. Talk to your pediatrician regarding the use of this medicine in children. Special care may be needed. Overdosage: If you think you have taken too much of this medicine contact a poison control center or emergency room at once. NOTE: This medicine is only for you. Do not share this medicine with others. What if I miss a dose? If you miss a dose, take it as soon as you can. If it is almost time for your next dose, take only that dose. Do not take double or extra doses. What may interact with this medicine? Do not take this medicine with any of the following medications: -linezolid -MAOIs like Carbex, Eldepryl, Marplan, Nardil, and Parnate -methylene blue (injected into a vein) This medicine may also interact with the following medications: -alcohol -antiviral medicines for HIV or AIDS -certain medicines that treat or prevent blood clots like warfarin -certain medicines for depression, anxiety, or psychotic disturbances -certain medicines for fungal infections like ketoconazole and itraconazole -certain medicines for migraine headache like almotriptan, eletriptan, frovatriptan, naratriptan, rizatriptan, sumatriptan, zolmitriptan -certain medicines for seizures like carbamazepine or phenytoin -certain medicines for sleep -cimetidine -erythromycin -fentanyl -lithium -medicines for blood pressure -nefazodone -rasagiline -rifampin -supplements like St. John's wort, kava kava, valerian -tramadol -tryptophan This list may not describe all possible interactions. Give your health care provider a list of all the medicines, herbs, non-prescription drugs, or dietary supplements you use. Also tell them if you smoke, drink alcohol, or use illegal drugs. Some items may interact with your medicine. What should I watch for while using this medicine? Tell your doctor if your symptoms do not get better or if they get  worse. Visit your doctor or health care professional for regular checks on your progress. Because it may take several weeks to see the full effects of this medicine, it is important to continue your treatment as prescribed by your doctor. Patients and their families should watch out for new or worsening thoughts of suicide or depression. Also watch out for sudden changes in feelings such as feeling anxious, agitated, panicky, irritable, hostile, aggressive, impulsive, severely restless, overly excited and hyperactive, or not being able to sleep. If this happens, especially at the beginning of treatment or after a change in dose, call your health care professional. Dennis Bast may get drowsy or dizzy. Do not drive, use machinery, or do anything that needs mental alertness until you know how this medicine affects you. Do not stand or sit up quickly, especially if you are an older patient. This reduces the risk of dizzy or fainting spells. Alcohol may interfere with the effect of this medicine. Avoid alcoholic drinks. This medicine may cause dry eyes and blurred vision. If you wear contact lenses you may feel some discomfort. Lubricating drops may help. See your eye doctor if the problem does not go away or is severe. Your mouth may get dry. Chewing sugarless gum or sucking hard candy, and drinking plenty of water may help. Contact your doctor if the problem does not go away or is severe. What side effects may I notice from receiving this medicine? Side effects that you should report to your doctor or health care professional as soon as possible: -allergic reactions like skin rash, itching or hives, swelling of the face, lips, or tongue -anxious -changes in vision -chest pain -confusion -elevated mood, decreased need for sleep, racing thoughts, impulsive behavior -eye pain -  fast, irregular heartbeat -feeling faint or lightheaded, falls -feeling agitated, angry, or irritable -fever or chills, sore  throat -hallucination, loss of contact with reality -loss of balance or coordination -mouth sores -redness, blistering, peeling or loosening of the skin, including inside the mouth -restlessness, pacing, inability to keep still -seizures -stiff muscles -suicidal thoughts or other mood changes -trouble passing urine or change in the amount of urine -trouble sleeping -unusual bleeding or bruising -unusually weak or tired -vomiting Side effects that usually do not require medical attention (report to your doctor or health care professional if they continue or are bothersome): -change in appetite -constipation -dizziness -dry mouth -muscle aches or pains -nausea -tired -weight gain This list may not describe all possible side effects. Call your doctor for medical advice about side effects. You may report side effects to FDA at 1-800-FDA-1088. Where should I keep my medicine? Keep out of the reach of children. Store at room temperature between 15 and 30 degrees C (59 and 86 degrees F) Protect from light and moisture. Throw away any unused medicine after the expiration date. NOTE: This sheet is a summary. It may not cover all possible information. If you have questions about this medicine, talk to your doctor, pharmacist, or health care provider.  2018 [employer]/Gold Standard (2015-10-21 17:30:45)

## 2018-04-25 NOTE — Assessment & Plan Note (Signed)
Resolved. Lungs clear. Continue oxygen. Use 4L with any activity.

## 2018-04-25 NOTE — Assessment & Plan Note (Signed)
Not under good control on 60mg  cymbalta. Very frustrated about his limitations after his stroke. Discussed adding remeron/wellbutrin, daughter is hesitant. Will discuss with family and call if they want to start anything. Continue cymbalta for now.

## 2018-04-25 NOTE — Assessment & Plan Note (Signed)
Will get B12 shots started weekly, then monthly to help with his energy.

## 2018-04-25 NOTE — Assessment & Plan Note (Signed)
PT started yesterday. Will continue to work with PT. Continue to monitor. Call with any concerns.

## 2018-04-25 NOTE — Assessment & Plan Note (Signed)
Speech coming back out to work with him. Continue to monitor.

## 2018-04-25 NOTE — Assessment & Plan Note (Signed)
Has been recommended to do nectar-thick liquids. Rx for thickener sent to pharmacy. Continue to follow with speech. Call with any concerns.

## 2018-04-25 NOTE — Progress Notes (Signed)
BP 112/67   Pulse 76   Temp (!) 97.5 F (36.4 C) (Oral)   Ht _0  (1.702 m)   Wt 185 lb (83.9 kg)   BMI 28.98 kg/m    Subjective:    Patient ID: Todd [name], male    DOB: 1931-05-11, 84 y.o.   MRN: [id number]  HPI: Todd [name] is a 84 y.o. male  Chief Complaint  Patient presents with  . Hospitalization Follow-up   Transition of Care Hospital Follow up.   Hospital/Facility: [facility] D/C Physician: Dr. Posey Pronto D/C Date: 04/23/18  Records Requested: 04/25/18 Records Received: 04/25/18 Records Reviewed: 04/25/18  Diagnoses on Discharge: Generalized weakness Acute hypoxic respiratory failure on chronic respiratory failure Acute diastolic CHF on chronic diastolic CHF Essential hypertension Previous stroke Dysphasia GERD Elevated troponin due to demand ischemia Essential tremors  Date of interactive Contact within 48 hours of discharge: 04/24/18 Contact was through: phone  Date of 7 day or 14 day face-to-face visit: 04/25/18   within 7 days  Outpatient Encounter Medications as of 04/25/2018  Medication Sig Note  . acetaminophen (TYLENOL 8 HOUR ARTHRITIS PAIN) 650 MG CR tablet Take 650 mg by mouth every 8 (eight) hours as needed for pain. 08/07/2017: Patient reports taking 2 tablets twice daily   . aspirin EC 81 MG tablet Take 81 mg by mouth daily.   [name] Kitchen atorvastatin (LIPITOR) 10 MG tablet Take 1 tablet (10 mg total) by mouth daily.   . Blood Glucose Monitoring Suppl (ACCU-CHEK AVIVA PLUS) w/Device KIT Use to check sugar levels x 2 daily   . clopidogrel (PLAVIX) 75 MG tablet Take 1 tablet (75 mg total) by mouth daily.   . DULoxetine (CYMBALTA) 60 MG capsule Take 1 capsule (60 mg total) by mouth daily.   [name] Kitchen gabapentin (NEURONTIN) 100 MG capsule Take 100 mg by mouth 2 (two) times daily. Once a day at night   . hydrALAZINE (APRESOLINE) 25 MG tablet Take 1 tablet (25 mg total) by mouth 3 (three) times daily.   . hydrochlorothiazide (HYDRODIURIL) 25 MG tablet Take 1 tablet (25 mg  total) by mouth daily.   . Lancets (ACCU-CHEK SOFT TOUCH) lancets Use as instructed   . latanoprost (XALATAN) 0.005 % ophthalmic solution Place 1 drop into both eyes at bedtime.    . metFORMIN (GLUCOPHAGE) 500 MG tablet Take 1 tablet (500 mg total) by mouth daily with breakfast.   . naproxen sodium (ALEVE) 220 MG tablet Take 220 mg by mouth 2 (two) times daily as needed.  08/07/2017: Patient reports taking at 2 to 3 tablets a day.   . pantoprazole (PROTONIX) 40 MG tablet Take 1 tablet (40 mg total) by mouth daily.   . propranolol ER (INDERAL LA) 80 MG 24 hr capsule Take 1 capsule (80 mg total) by mouth daily.   [name] Kitchen telmisartan (MICARDIS) 80 MG tablet Take 1 tablet (80 mg total) by mouth daily.   [name] Kitchen testosterone (ANDRODERM) 4 MG/24HR PT24 patch Place 1 patch onto the skin daily.   . [DISCONTINUED] DULoxetine (CYMBALTA) 20 MG capsule    . [DISCONTINUED] LORazepam (ATIVAN) 0.5 MG tablet Take 1 tablet (0.5 mg total) by mouth 2 (two) times daily as needed for anxiety.   . food thickener (RESOURCE THICKENUP CLEAR) POWD Follow instructions on package for nectar consistency in all liquids    No facility-administered encounter medications on file as of 04/25/2018.    Per hospitalist: " HOSPITAL COURSE:  DavidWoodsis a23 y.o.malewith a known history of stroke, with minimal left-sided weakness  and expressive aphasia, CAD, arthritis, diabetes, GERD, hypertension, iron deficiency anemia presents from home secondary to significant weakness this morning.  *Weakness Most likely secondary to chronic deconditioning, history of stroke aphasia/hemiparesis Patient and the patient's family do not want rehab-are interested in home health physical therapy going forward, given B12 injection x1, TSH was normal  *History of cerebrovascular accident with aphasia/hemiparesis Patient was seen by speech and recommended nectar thick liquids I have instructed the family regarding this   *Acute diastolic CHF-with  minimal exacerbation  treated with IV Lasix  *Acute hypoxic respiratory failure on chronic 3 L of oxygen and had a CT of the chest showed no PE this likely related to poor functional status and atelectasis I recommended family to increase oxygen to 4 L with activity  *Hypertension Controlled on current regiment"  Diagnostic Tests Reviewed: CLINICAL DATA:  Weakness  EXAM: CHEST - 2 VIEW  COMPARISON:  12/17/2017  FINDINGS: Cardiomegaly with vascular congestion. Mild elevation of the right hemidiaphragm, stable. No edema. Bibasilar atelectasis. No visible effusions. No acute bony abnormality.  IMPRESSION: Cardiomegaly with vascular congestion.  Bibasilar atelectasis.  CLINICAL DATA:  Altered level of consciousness  EXAM: CT HEAD WITHOUT CONTRAST  TECHNIQUE: Contiguous axial images were obtained from the base of the skull through the vertex without intravenous contrast.  COMPARISON:  MRI brain dated 05/18/2017  FINDINGS: Brain: No evidence of acute infarction, hemorrhage, hydrocephalus, extra-axial collection or mass lesion/mass effect.  Subcortical white matter and periventricular small vessel ischemic changes.  Vascular: Intracranial atherosclerosis.  Skull: Normal. Negative for fracture or focal lesion.  Sinuses/Orbits: The visualized paranasal sinuses are essentially clear. The mastoid air cells are unopacified.  Other: None.  IMPRESSION: No evidence of acute intracranial abnormality.  Small vessel ischemic changes.  CLINICAL DATA:  84 year old male with shortness of breath, hypoxia with exertion.  EXAM: CT ANGIOGRAPHY CHEST WITH CONTRAST  TECHNIQUE: Multidetector CT imaging of the chest was performed using the standard protocol during bolus administration of intravenous contrast. Multiplanar CT image reconstructions and MIPs were obtained to evaluate the vascular anatomy.  CONTRAST:  64m OMNIPAQUE IOHEXOL 350 MG/ML  SOLN  COMPARISON:  Chest radiographs 04/20/2018 and earlier. Lumbar MRI 04/11/2017.  FINDINGS: Cardiovascular: Excellent contrast bolus timing in the pulmonary arterial tree. Mild respiratory motion.  No focal filling defect identified in the pulmonary arteries to suggest acute pulmonary embolism.  Mild cardiomegaly. Widespread calcified coronary artery atherosclerosis and/or stents. No pericardial effusion. Lesser Calcified aortic atherosclerosis.  Mediastinum/Nodes: No mediastinal lymphadenopathy.  Lungs/Pleura: Major airways are patent. There is confluent opacity throughout the medial aspect of the right lower lobe from the superior segment to the medial basal segment. A portion of this enhances such as due to atelectasis, but some of it appears nonenhancing. There are some air bronchograms. There is no associated pleural effusion. There is some nearby patchy opacity in the posterior right costophrenic angle.  There is curvilinear atelectasis or scarring in the medial segment of the right middle lobe. There is minor left lower lobe atelectasis.  Upper Abdomen: The partially visible gallbladder appears distended (10 centimeters in length), but otherwise within normal limits. Negative visible liver, spleen, adrenal glands. There is some oral contrast in the stomach.  Musculoskeletal: Widespread thoracic spine ankylosis related to flowing endplate osteophytes or syndesmophytes. The lumbar spine did not appear similarly ankylosed in 2018. Bulky midthoracic osteophytosis with vacuum phenomena. No acute osseous abnormality identified. Advanced degenerative changes at the glenohumeral joints.  Review of the MIP images confirms the above findings.  IMPRESSION: 1. Negative for acute pulmonary embolus. 2. Confluent opacity in the medial aspect of the right lower lobe is nonspecific and might represent a combination of atelectasis and pneumonia. No pleural effusion.  Has the patient had any prior radiation treatment to the right chest? 3. No other acute pulmonary process. 4. Distended gallbladder. If there is right upper quadrant pain then follow-up ultrasound would be recommended. 5. Calcified coronary artery atherosclerosis and mild cardiomegaly. 6. Widespread thoracic spine ankylosis, favor Diffuse idiopathic skeletal hyperostosis (DISH).  Disposition: Home  Consults: None  Discharge Instructions:  Follow up here  Disease/illness Education:  Given in writing  Home Health/Community Services Discussions/Referrals: Home health with PT  Establishment or re-establishment of referral orders for community resources: Home health with PT  Discussion with other health care providers: None  Assessment and Support of treatment regimen adherence: Good  Appointments Coordinated with:  Patient and daughter  Education for self-management, independent living, and ADLs:  Fair  Mr. Fuelling and his daughter note that he has been feeling about the same since getting out of the hospital. PT came out yesterday to work with them at home. They are concerned about his weakness. He has not gotten any better. He is tired all the time. He feels like he can't do anything. He is very frustrated about his limitations after the stroke and his inability to speak. He has been very depressed. This is minimized by his family who thinks that that is just the way things are. They are hesitant to start him on any other medicine.   DEPRESSION Mood status: uncontrolled Satisfied with current treatment?: no Symptom severity: severe  Duration of current treatment : months Side effects: no Medication compliance: good compliance Psychotherapy/counseling: no  Previous psychiatric medications: cymbalta Depressed mood: yes Anxious mood: yes Anhedonia: yes Significant weight loss or gain: no Insomnia: no  Fatigue: yes Feelings of worthlessness or guilt: yes Impaired  concentration/indecisiveness: yes Suicidal ideations: yes- no plans, just passive Hopelessness: yes Crying spells: yes Depression screen [facility] 2/9 04/25/2018 10/26/2017 07/04/2017 06/13/2017 05/07/2017  Decreased Interest 3 0 0 0 0  Down, Depressed, Hopeless 3 0 1 0 0  PHQ - 2 Score 6 0 1 0 0  Altered sleeping 2 - - - -  Tired, decreased energy 2 - - - -  Change in appetite 1 - - - -  Feeling bad or failure about yourself  3 - - - -  Trouble concentrating 0 - - - -  Moving slowly or fidgety/restless 3 - - - -  Suicidal thoughts 2 - - - -  PHQ-9 Score 19 - - - -  Difficult doing work/chores Very difficult - - - -   GAD 7 : Generalized Anxiety Score 04/25/2018 09/27/2016  Nervous, Anxious, on Edge 2 3  Control/stop worrying 3 3  Worry too much - different things 3 3  Trouble relaxing 2 1  Restless 0 1  Easily annoyed or irritable 2 2  Afraid - awful might happen 1 0  Total GAD 7 Score 13 13  Anxiety Difficulty Somewhat difficult -    Relevant past medical, surgical, family and social history reviewed and updated as indicated. Interim medical history since our last visit reviewed. Allergies and medications reviewed and updated.  Review of Systems  Per HPI unless specifically indicated above     Objective:    BP 112/67   Pulse 76   Temp (!) 97.5 F (36.4 C) (Oral)   Ht '5\' 7"'$  (1.702  m)   Wt 185 lb (83.9 kg)   BMI 28.98 kg/m   Wt Readings from Last 3 Encounters:  04/25/18 185 lb (83.9 kg)  04/20/18 188 lb (85.3 kg)  04/12/18 182 lb (82.6 kg)    Physical Exam  Constitutional: He is oriented to person, place, and time. He appears well-developed and well-nourished. No distress.  HENT:  Head: Normocephalic and atraumatic.  Right Ear: Hearing normal.  Left Ear: Hearing normal.  Nose: Nose normal.  Eyes: Conjunctivae and lids are normal. Right eye exhibits no discharge. Left eye exhibits no discharge. No scleral icterus.  Cardiovascular: Normal rate, regular rhythm, normal  heart sounds and intact distal pulses. Exam reveals no gallop and no friction rub.  No murmur heard. Pulmonary/Chest: Effort normal and breath sounds normal. No stridor. No respiratory distress. He has no wheezes. He has no rales. He exhibits no tenderness.  Musculoskeletal: Normal range of motion. He exhibits no edema, tenderness or deformity.  Neurological: He is alert and oriented to person, place, and time.  L sided hemiplegia  Skin: Skin is warm, dry and intact. Capillary refill takes less than 2 seconds. No rash noted. He is not diaphoretic. No erythema. No pallor.  Psychiatric: He has a normal mood and affect. His speech is normal and behavior is normal. Judgment and thought content normal. Cognition and memory are normal.  Nursing note and vitals reviewed.   Results for orders placed or performed during the hospital encounter of 04/20/18  CBC with Differential  Result Value Ref Range   WBC 6.8 4.0 - 10.5 K/uL   RBC 3.36 (L) 4.22 - 5.81 MIL/uL   Hemoglobin 10.4 (L) 13.0 - 17.0 g/dL   HCT 33.9 (L) 39.0 - 52.0 %   MCV 100.9 (H) 80.0 - 100.0 fL   MCH 31.0 26.0 - 34.0 pg   MCHC 30.7 30.0 - 36.0 g/dL   RDW 12.0 11.5 - 15.5 %   Platelets 260 150 - 400 K/uL   nRBC 0.0 0.0 - 0.2 %   Neutrophils Relative % 75 %   Neutro Abs 5.1 1.7 - 7.7 K/uL   Lymphocytes Relative 14 %   Lymphs Abs 0.9 0.7 - 4.0 K/uL   Monocytes Relative 8 %   Monocytes Absolute 0.6 0.1 - 1.0 K/uL   Eosinophils Relative 1 %   Eosinophils Absolute 0.1 0.0 - 0.5 K/uL   Basophils Relative 1 %   Basophils Absolute 0.1 0.0 - 0.1 K/uL   Immature Granulocytes 1 %   Abs Immature Granulocytes 0.09 (H) 0.00 - 0.07 K/uL  Comprehensive metabolic panel  Result Value Ref Range   Sodium 140 135 - 145 mmol/L   Potassium 4.2 3.5 - 5.1 mmol/L   Chloride 103 98 - 111 mmol/L   CO2 27 22 - 32 mmol/L   Glucose, Bld 268 (H) 70 - 99 mg/dL   BUN 20 8 - 23 mg/dL   Creatinine, Ser 0.93 0.61 - 1.24 mg/dL   Calcium 9.1 8.9 - 10.3  mg/dL   Total Protein 7.0 6.5 - 8.1 g/dL   Albumin 4.0 3.5 - 5.0 g/dL   AST 22 15 - 41 U/L   ALT 12 0 - 44 U/L   Alkaline Phosphatase 64 38 - 126 U/L   Total Bilirubin 0.8 0.3 - 1.2 mg/dL   GFR calc non Af Amer >60 >60 mL/min   GFR calc Af Amer >60 >60 mL/min   Anion gap 10 5 - 15  Troponin I - Once  Result Value Ref Range   Troponin I 0.04 (HH) <0.03 ng/mL  Urinalysis, Complete w Microscopic  Result Value Ref Range   Color, Urine YELLOW (A) YELLOW   APPearance CLEAR (A) CLEAR   Specific Gravity, Urine 1.016 1.005 - 1.030   pH 6.0 5.0 - 8.0   Glucose, UA 150 (A) NEGATIVE mg/dL   Hgb urine dipstick NEGATIVE NEGATIVE   Bilirubin Urine NEGATIVE NEGATIVE   Ketones, ur NEGATIVE NEGATIVE mg/dL   Protein, ur NEGATIVE NEGATIVE mg/dL   Nitrite NEGATIVE NEGATIVE   Leukocytes, UA NEGATIVE NEGATIVE   WBC, UA 0-5 0 - 5 WBC/hpf   Bacteria, UA NONE SEEN NONE SEEN   Squamous Epithelial / LPF 0-5 0 - 5   Mucus PRESENT    Hyaline Casts, UA PRESENT   Troponin I - ONCE - STAT  Result Value Ref Range   Troponin I 0.05 (HH) <0.03 ng/mL  Vitamin B12  Result Value Ref Range   Vitamin B-12 521 180 - 914 pg/mL  VITAMIN D 25 Hydroxy (Vit-D Deficiency, Fractures)  Result Value Ref Range   Vit D, 25-Hydroxy 33.1 30.0 - 100.0 ng/mL  Brain natriuretic peptide  Result Value Ref Range   B Natriuretic Peptide 332.0 (H) 0.0 - 100.0 pg/mL  TSH  Result Value Ref Range   TSH 0.517 0.350 - 4.500 uIU/mL  Zinc  Result Value Ref Range   Zinc 50 (L) 56 - 134 ug/dL  TSH  Result Value Ref Range   TSH 0.535 0.350 - 4.500 uIU/mL  Troponin I - Now Then Q6H  Result Value Ref Range   Troponin I 0.06 (HH) <0.03 ng/mL  Troponin I - Now Then Q6H  Result Value Ref Range   Troponin I 0.07 (HH) <0.03 ng/mL  Troponin I - Now Then Q6H  Result Value Ref Range   Troponin I 0.08 (HH) <0.03 ng/mL  Basic metabolic panel  Result Value Ref Range   Sodium 140 135 - 145 mmol/L   Potassium 3.3 (L) 3.5 - 5.1 mmol/L    Chloride 101 98 - 111 mmol/L   CO2 32 22 - 32 mmol/L   Glucose, Bld 148 (H) 70 - 99 mg/dL   BUN 21 8 - 23 mg/dL   Creatinine, Ser 1.00 0.61 - 1.24 mg/dL   Calcium 9.1 8.9 - 10.3 mg/dL   GFR calc non Af Amer >60 >60 mL/min   GFR calc Af Amer >60 >60 mL/min   Anion gap 7 5 - 15  CBC  Result Value Ref Range   WBC 7.2 4.0 - 10.5 K/uL   RBC 2.98 (L) 4.22 - 5.81 MIL/uL   Hemoglobin 9.3 (L) 13.0 - 17.0 g/dL   HCT 29.0 (L) 39.0 - 52.0 %   MCV 97.3 80.0 - 100.0 fL   MCH 31.2 26.0 - 34.0 pg   MCHC 32.1 30.0 - 36.0 g/dL   RDW 12.1 11.5 - 15.5 %   Platelets 235 150 - 400 K/uL   nRBC 0.0 0.0 - 0.2 %      Assessment & Plan:   Problem List Items Addressed This Visit      Cardiovascular and Mediastinum   Hypertension    Stable right now. Continue to monitor. Continue current regimen.         Respiratory   Acute respiratory failure (HCC)    Resolved. Lungs clear. Continue oxygen. Use 4L with any activity.        Digestive   Dysphagia as late effect of cerebrovascular  accident (CVA)    Has been recommended to do nectar-thick liquids. Rx for thickener sent to pharmacy. Continue to follow with speech. Call with any concerns.         Nervous and Auditory   Hemiplegia ([place])    PT started yesterday. Will continue to work with PT. Continue to monitor. Call with any concerns.         Other   Aphasia    Speech coming back out to work with him. Continue to monitor.       History of stroke with residual deficit    Significant deficits with aphasia, dysphagia and hemiplegia. Will continue to work with PT and speech. Call with any concerns.       Weakness - Primary    Likely multifactorial and at least in part due to his depression and CVA. Will continue PT, start B12 injections. Encouraged augmeting depression treatment for better control. Hospital gave him testosterone, but not covered by insurance and very expensive. Will avoid for now.      B12 deficiency    Will get B12 shots  started weekly, then monthly to help with his energy.      Depression, major, single episode, severe ([place])    Not under good control on 23m cymbalta. Very frustrated about his limitations after his stroke. Discussed adding remeron/wellbutrin, daughter is hesitant. Will discuss with family and call if they want to start anything. Continue cymbalta for now.      Relevant Medications   DULoxetine (CYMBALTA) 60 MG capsule    Other Visit Diagnoses    Acute diastolic CHF (congestive heart failure) (HCC)       Resolved. Euvolemic today. Continue to monitor.        Follow up plan: Return 2-3 weeks, for Follow up depression.

## 2018-04-25 NOTE — Assessment & Plan Note (Signed)
Likely multifactorial and at least in part due to his depression and CVA. Will continue PT, start B12 injections. Encouraged augmeting depression treatment for better control. Hospital gave him testosterone, but not covered by insurance and very expensive. Will avoid for now.

## 2018-04-26 ENCOUNTER — Telehealth: Payer: Self-pay | Admitting: Family Medicine

## 2018-04-26 NOTE — Telephone Encounter (Signed)
OK to give verbal orders. Thanks

## 2018-04-26 NOTE — Telephone Encounter (Signed)
Called and left Amy a VM letting her know that Dr. Wynetta Emery gave the [place] for all verbal orders.

## 2018-04-26 NOTE — Telephone Encounter (Signed)
Copied from [place] [id number]. Topic: Quick Communication - Home Health Verbal Orders >> Apr 26, 2018 12:37 PM Margot Ables wrote: Caller/Agency: [place] with [facility] Callback Number: [phone], secure VM if not available Requesting OT/PT/Skilled Nursing/Social Work: VO request for ST Frequency: 1 week 1, 2 week 2

## 2018-04-29 ENCOUNTER — Other Ambulatory Visit: Payer: Self-pay

## 2018-04-29 ENCOUNTER — Ambulatory Visit: Payer: Self-pay

## 2018-04-29 ENCOUNTER — Ambulatory Visit: Payer: Self-pay | Admitting: *Deleted

## 2018-04-29 ENCOUNTER — Telehealth: Payer: Self-pay | Admitting: Family Medicine

## 2018-04-29 NOTE — Telephone Encounter (Signed)
Spoke with patient's daughter, Todd [name], regarding pt's symptoms; she said that his oxygen levels are low; she said that when he was in the hospital they had a hard time getting his oxygen levels; Todd [name] says that the pt coughs a lot, mostly after he eats something; he also has a runny nose and congestion (pt has allergies but it seems worse since he has been on oxygen); Todd [name] is not sure how long the pt has been coughing a lot after meals; she says that it sounds like he has congestion in his throat; Amy McKey, speech therapy for [name] is on site and she says that his oxygen sats is 77% opn 3L [name] and temp 96.2 (pt just had something cold to drink); when asked by the speech therapist how he feelsethe pt states that he "does not feel good all over"; recommendations made per nurse triage protocol; Albina Billet understanding; will route to office for notification of this encounter; office closed for lunch 1200-1300.       Reason for Disposition . Patient sounds very sick or weak to the triager  Answer Assessment - Initial Assessment Questions 1. ONSET: "When did the cough begin?"      Not sure 2. SEVERITY: "How bad is the cough today?"      moderate 3. RESPIRATORY DISTRESS: "Describe your breathing."      Worse after eating 4. FEVER: "Do you have a fever?" If so, ask: "What is your temperature, how was it measured, and when did it start?"     No temp 97 per home health nurse 5. HEMOPTYSIS: "Are you coughing up any blood?" If so ask: "How much?" (flecks, streaks, tablespoons, etc.)     no 6. TREATMENT: "What have you done so far to treat the cough?" (e.g., meds, fluids, humidifier)     no 7. CARDIAC HISTORY: "Do you have any history of heart disease?" (e.g., heart attack, congestive heart failure)      High blood pressure  8. LUNG HISTORY: "Do you have any history of lung disease?"  (e.g., pulmonary embolus, asthma, emphysema)     Oxygen dependent 3 L [name] 9. PE RISK FACTORS: "Do you  have a history of blood clots?" (or: recent major surgery, recent prolonged travel, bedridden)     no 10. OTHER SYMPTOMS: "Do you have any other symptoms? (e.g., runny nose, wheezing, chest pain)       Runny nose 11. PREGNANCY: "Is there any chance you are pregnant?" "When was your last menstrual period?"       n/a 12. TRAVEL: "Have you traveled out of the country in the last month?" (e.g., travel history, exposures)       no  Protocols used: COUGH - ACUTE NON-PRODUCTIVE-A-AH

## 2018-04-29 NOTE — Telephone Encounter (Signed)
Attempted to contact pt for information about symptoms; left message on voicemail [phone]

## 2018-04-29 NOTE — Patient Outreach (Addendum)
[facility]) Care Management  [place]  04/29/2018  Todd [name] 08-20-1930 [id number]  84 year old male outreached by [place] services for a 30 day post discharge medication review.  PMHx includes, but not limited to, hypertension, coronary artery disease, heart failure, GERD, diabetes, h/o prostate cancer, depression and hyperlipidemia.  Unsuccessful telephone call attempt # 2 to Todd [name].  HIPAA compliant voicemail left requesting a return call.  Plan: Outreach attempt in 3-4 business days.   Joetta Manners, PharmD Clinical Pharmacist [place] [phone]  Addendum: Incoming call received from [name], Todd [name].  HIPAA identifiers verified.   Todd [name]' sister declined [place] to complete a discharge medication review.    Joetta Manners, PharmD Clinical Pharmacist [place] [phone]

## 2018-04-29 NOTE — Telephone Encounter (Signed)
[name], Therapist, sports, for [place], called stating the Todd [name]'s oxygen saturation is 80-83 on 3 liters nasal cannula; according to the family he does have a cough which is normal for him; Rojelio Brenner said that in the hospital they were able to get his sats into the 90s; she said that the Todd [name]'s lung sounds were diminished in the bases; he is on a nectar thick diet and is being seen by a speech therapist; Rojelio Brenner said that the Todd [name] had a nosebleed on 04/29/18 (just a little bit on a napkin per the son"; she is no longer with the Todd [name]; will contact the Todd [name] for triage.

## 2018-04-29 NOTE — Telephone Encounter (Signed)
Copied from [place] [phone]. Topic: Quick Communication - Home Health Verbal Orders >> Apr 29, 2018 12:06 PM Berneta Levins wrote: Caller/Agency: Misty with [place] Number: [phone], OK to leave a message Requesting OT/PT/Skilled Nursing/Social Work: Nursing Frequency: 2x a week x1 and then 1x a week x3.  Misty states that Dr. Wynetta Emery put in an order for [facility] to give B12 injections - they can do the injections, but the medication was never called in to the pharmacy.  This needs to be called in to [facility].  Today on assessment oxygen saturations are in the low to mid 80s, and pt is on 3 liters of Oxygen.  Misty knows this is an ongoing issue for pt - but didn't know if Dr. Wynetta Emery wanted to update orders for oxygen or get a portable chest x-ray to assess and make sure nothing else is going on - call given to NT in regards to oxygen issue.

## 2018-05-06 ENCOUNTER — Encounter: Payer: PPO | Admitting: Speech Pathology

## 2018-05-06 ENCOUNTER — Inpatient Hospital Stay: Payer: Self-pay | Admitting: Family Medicine

## 2018-05-06 ENCOUNTER — Ambulatory Visit: Payer: Self-pay

## 2018-05-07 ENCOUNTER — Inpatient Hospital Stay: Payer: Self-pay | Admitting: Family Medicine

## 2018-05-07 MED ORDER — CYANOCOBALAMIN 1000 MCG/ML IJ SOLN: INTRAMUSCULAR | 12 refills | Status: DC

## 2018-05-07 NOTE — Telephone Encounter (Signed)
Misty is calling back to follow up on this. She states if the B12 injections also need to be done that it would need to be called to the pharmacy as they do not supply that. Please contact her.   [phone]

## 2018-05-07 NOTE — Telephone Encounter (Signed)
B12 order sent to his pharmacy. OK for them to start.

## 2018-05-07 NOTE — Telephone Encounter (Signed)
Forwarding to PCP.

## 2018-05-08 NOTE — Progress Notes (Signed)
Cardiology Office Note Date:  05/09/2018  Patient ID:  Todd [name], DOB [birth year], MRN [id number] PCP:  Todd Maple, MD  Cardiologist:  Dr. Rockey Situ, MD    Chief Complaint: Hospital follow up  History of Present Illness: Todd [name] is a 84 y.o. 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   Prior echo in 02/2015 showed an EF of 55-60%, no RWMA, Gr1DD, trivial AI, calcified mitral annulus, mildly dilated left atrium, PASP 35 mmHg. He was admitted in 03/2017 with chest pain and ruled out. Echo showed and EF of 55-65%, mild to moderate AI. Nuclear stress test at that time showed an EF of 45-54% without evidence of significant ischemia. Low risk study. He was admitted to the hospital in 05/2017 with CVA with MRI brain showing an acute to subacute left pontine infarct as well as a chronic left thalamic lacunar infarct. Repeat echo showed an EF of 55-60%, no RWMA, Gr1DD, trivial AI, calcified mitral annulus, mildly dilated left atrium. Carotid artery ultrasound showed 1-39% bilateral ICA stenosis. He has been managed with ASA and Plavix per neurology.   He was last seen in the office in 09/2017 with the family's main complaint being chronic fatigue. Since that time, he has been seen in the ED x 2 for fatigue and most recently he was admitted in mid 04/2018 for continued weakness and fatigue. We were not consulted during this admission. He was noted to have an elevated troponin that peaked at 0.08, HGB 10.4 that trended down to 9.3, K+ 4.2 that trended down to 3.3, TSH normal x 2, BNP 332, LFT normal. CXR with cardiomegaly and vascular congestion as well as bibasilar atelectasis. CT of the head showed no acute intracranial  pathology. CTA chest was negative for PE. There was a nonspecific confluent opacity in the medial aspect of the right lower lobe concerning for possible PNA. He was also noted to have a distended gallbladder with follow up ultrasound being recommended by radiology. He was diuresed by internal medicine. Discharge weight documented as 85.3 kg.  He was seen by his PCP on 11/21 with the family continuing to note persistent weakness, stating the patient was about the same as when he was admitted. Weight was 83.9 kg.   Patient comes in accompanied by 2 daughters today.  Most of the history is obtained from them as the patient is mostly a phasic secondary to his stroke.  They continue to indicate the patient has had profound fatigue over the past 12 months which stems from his stroke as outlined above.  This has been a stable issue over the past 12 months and is not acutely worse.  They indicate the patient has not indicated to them that he has had any chest pain, worsening shortness of breath, palpitations, or lower extremity swelling.  His appetite has been declining and this setting.  He was prescribed testosterone from the [facility] hospitalist which they have not started as they have concerns with this given his underlying history of prostate cancer.  They plan to discuss this further with his urologist.  He appears to be compliant with aspirin, Lipitor, Plavix, hydralazine, HCTZ, telmisartan, and propanolol from a cardiac perspective.  His blood pressures are not being checked  at home.  He is not weighed on a routine basis.  Past Medical History:  Diagnosis Date  . Anxiety   . Aortic regurgitation    a. 03/2017 Echo: Mod AI; b. 05/2017 Echo: Triv AI.  [name] Kitchen Arthritis    osteoarthritis-left knee  . CAD (coronary artery disease)    a. Reported h/o cath ~ 2008 - ? small vessel dzs. No PCI performed; b. 03/2017 MV: EF 45-54%, no ischemia/infarct.  . Depression   . Diabetes mellitus without complication ([place])   .  Diastolic dysfunction    a. 03/2017 Echo: EF 55-65%; b. 05/2017 Echo: EF 55-60%, no rwma, Gr1 DD. Triv AI.  Mildly dil LA.  . ED (erectile dysfunction)   . Elevated troponin    a. chronic mild elevation - 0.03->0.04.  [name] Kitchen GERD (gastroesophageal reflux disease)   . Glaucoma   . Hyperlipidemia   . Hypertension   . Iron deficiency anemia due to chronic blood loss   . Left pontine stroke w/ cerebrovascular disease(H[facility])    a. 05/2017 MRI/A: acute to subacute L pontine infarct. Chronic left thalamic lacunar infarct. Severe basilar artery stenosis w/ radiographic string sign of the distal 1/3. Mild to moderate L PCA P2 segment stenosis; b. 05/2017 Carotid U/S: <50% bilat ICA stenosis.  . Lumbago    Lumbosacral Neuritis  . Prostate cancer [facility])     Past Surgical History:  Procedure Laterality Date  . CARPAL TUNNEL RELEASE    . REPLACEMENT TOTAL KNEE BILATERAL      Current Meds  Medication Sig  . acetaminophen (TYLENOL 8 HOUR ARTHRITIS PAIN) 650 MG CR tablet Take 650 mg by mouth every 8 (eight) hours as needed for pain.  [name] Kitchen aspirin EC 81 MG tablet Take 81 mg by mouth daily.  [name] Kitchen atorvastatin (LIPITOR) 10 MG tablet Take 1 tablet (10 mg total) by mouth daily.  . Blood Glucose Monitoring Suppl (ACCU-CHEK AVIVA PLUS) w/Device KIT Use to check sugar levels x 2 daily  . clopidogrel (PLAVIX) 75 MG tablet Take 1 tablet (75 mg total) by mouth daily.  . cyanocobalamin (,VITAMIN B-12,) 1000 MCG/ML injection 76m weekly x4 weeks, then 172mmonthly after that  . DULoxetine (CYMBALTA) 60 MG capsule Take 1 capsule (60 mg total) by mouth daily.  . food thickener (RESOURCE THICKENUP CLEAR) POWD Follow instructions on package for nectar consistency in all liquids  . gabapentin (NEURONTIN) 100 MG capsule Take 100 mg by mouth 2 (two) times daily. Once a day at night  . hydrALAZINE (APRESOLINE) 25 MG tablet Take 1 tablet (25 mg total) by mouth 3 (three) times daily.  . hydrochlorothiazide (HYDRODIURIL) 25 MG tablet  Take 1 tablet (25 mg total) by mouth daily.  . Lancets (ACCU-CHEK SOFT TOUCH) lancets Use as instructed  . latanoprost (XALATAN) 0.005 % ophthalmic solution Place 1 drop into both eyes at bedtime.   . metFORMIN (GLUCOPHAGE) 500 MG tablet Take 1 tablet (500 mg total) by mouth daily with breakfast.  . naproxen sodium (ALEVE) 220 MG tablet Take 220 mg by mouth 2 (two) times daily as needed.   . pantoprazole (PROTONIX) 40 MG tablet Take 1 tablet (40 mg total) by mouth daily.  . propranolol ER (INDERAL LA) 80 MG 24 hr capsule Take 1 capsule (80 mg total) by mouth daily.  . [name] Kitchenelmisartan (MICARDIS) 80 MG tablet Take 1 tablet (80 mg total) by mouth daily.  . [name] Kitchenestosterone (ANDRODERM) 4 MG/24HR PT24 patch Place 1 patch onto the skin daily.    Allergies:  Amlodipine; Fluoxetine; and Meloxicam   Social History:  The patient  reports that he quit smoking about 23 years ago. He has never used smokeless tobacco. He reports that he drinks about 1.0 standard drinks of alcohol per week. He reports that he does not use drugs.   Family History:  The patient's family history includes Hypertension in his father and mother; Stroke in his mother and sister.  ROS:   Review of Systems  Constitutional: Positive for malaise/fatigue. Negative for chills, diaphoresis, fever and weight loss.  HENT: Negative for congestion.   Eyes: Negative for discharge and redness.  Respiratory: Positive for shortness of breath. Negative for cough, hemoptysis, sputum production and wheezing.   Cardiovascular: Positive for leg swelling. Negative for chest pain, palpitations, orthopnea, claudication and PND.  Gastrointestinal: Negative for abdominal pain, blood in stool, heartburn, melena, nausea and vomiting.  Genitourinary: Negative for hematuria.  Musculoskeletal: Negative for falls and myalgias.  Skin: Negative for rash.  Neurological: Positive for dizziness and weakness. Negative for tingling, tremors, sensory change, speech  change, focal weakness and loss of consciousness.  Endo/Heme/Allergies: Does not bruise/bleed easily.  Psychiatric/Behavioral: Negative for substance abuse. The patient is not nervous/anxious.   All other systems reviewed and are negative.   Review of systems is obtained from both the patient with yes/no answers and patient's daughters.  PHYSICAL EXAM:  VS:  BP 140/66 (BP Location: Left Arm, Patient Position: Sitting, Cuff Size: Normal)   Pulse 71   Ht _0  (1.702 m)   Wt 196 lb 4 oz (89 kg)   BMI 30.74 kg/m  BMI: Body mass index is 30.74 kg/m.  Physical Exam  Constitutional: He is oriented to person, place, and time. He appears well-developed and well-nourished.  HENT:  Head: Normocephalic and atraumatic.  Eyes: Right eye exhibits no discharge. Left eye exhibits no discharge.  Neck: Normal range of motion. No JVD present.  Cardiovascular: Normal rate, S1 normal, S2 normal and normal heart sounds. An irregular rhythm present. Exam reveals no distant heart sounds, no friction rub, no midsystolic click and no opening snap.  No murmur heard. Frequent ventricular ectopy  Pulmonary/Chest: Effort normal and breath sounds normal. No respiratory distress. He has no decreased breath sounds. He has no wheezes. He has no rales. He exhibits no tenderness.  Diminished breath sounds bilaterally  Abdominal: Soft. He exhibits no distension. There is no tenderness.  Musculoskeletal: He exhibits edema.  Trace bilateral pretibial edema  Neurological: He is alert and oriented to person, place, and time.  Skin: Skin is warm and dry. No cyanosis. Nails show no clubbing.  Psychiatric: He has a normal mood and affect. His speech is normal and behavior is normal. Judgment and thought content normal.     EKG:  Was ordered and interpreted by me today. Shows NSR, 71 bpm, left axis deviation, left anterior fascicular block, frequent PVCs, poor R wave progression, nonspecific st/t changes, baseline artifact  in the setting of his underlying tremor  Recent Labs: 04/20/2018: ALT 12; B Natriuretic Peptide 332.0; TSH 0.535 04/21/2018: BUN 21; Creatinine, Ser 1.00; Hemoglobin 9.3; Platelets 235; Potassium 3.3; Sodium 140  05/18/2017: Total CHOL/HDL Ratio 3.4 08/23/2017: HDL 40; LDL Calculated 75 09/26/2017: Cholesterol Piccolo, Waived 156; Triglycerides Piccolo,Waived 156; VLDL Chol Calc Piccolo,Waive 31   Estimated Creatinine Clearance: 57.5 mL/min (by C-G formula based on SCr of 1 mg/dL).   Wt Readings from Last 3 Encounters:  05/09/18 196 lb 4 oz (89 kg)  04/25/18 185 lb (83.9 kg)  04/20/18 188 lb (85.3 kg)     Other studies reviewed: Additional studies/records reviewed today include: summarized above  ASSESSMENT AND PLAN:  1. Frequent PVCs: Place outpatient cardiac monitor to quantify PVC burden.  Check echocardiogram, CMP, magnesium, and TSH.  Remains on propanolol.  Pending PVC burden and echocardiogram results may need to revisit potential ischemic evaluation though this would need to be discussed in detail with the patient and his family given his underlying comorbid conditions.  2. Fatigue/weakness: This has been a long-standing issue and dates back to his stroke in 05/2017.  It is likely multifactorial including his stroke with residual deficits, anemia, hypogonadism, prostate cancer, significant physical deconditioning, and possible ventricular ectopy.  He would benefit from home health PT if PCP could arrange this.  3. Coronary artery disease involving the native coronary arteries without angina: No symptoms concerning for chest pain at this time.  Remains on aspirin and Plavix as outlined below given his stroke.  Continue propanolol and Lipitor.  Aggressive risk factor modification.  4. Lower extremity swelling: Echocardiogram 1 year prior demonstrated preserved LV systolic function with grade 1 diastolic dysfunction.  I suspect some of his lower extremity swelling is dependent edema  secondary to venous insufficiency as well as possible third spacing from his anemia and potential hypoalbuminemia.  We are checking a CBC and liver function to assess for low protein levels which certainly could be contributing to his lower extremity swelling.  He spends most of his time sitting in a chair/wheelchair.  Recommend he elevate his legs and could consider compression stockings.  Check echocardiogram to evaluate LV systolic function and diastolic function.  5. History of stroke with residual deficits: Followed by neurology.  Remains on dual antiplatelet therapy per neurology.  6. Anemia: Likely contributing to his fatigue/weakness.  Per PCP.  7. Prostate cancer: Managed by urology.  8. Hypogonadism: This too is likely contributing to his fatigue/weakness however is an overall difficult situation given his underlying prostate cancer.  Managed by urology.  Disposition: F/u with Dr. Rockey Situ or an APP in 2 months.  Current medicines are reviewed at length with the patient today.  The patient did not have any concerns regarding medicines.  Signed, Christell Faith, PA-C 05/09/2018 2:49 PM     [address], [place] [address] [phone]

## 2018-05-09 ENCOUNTER — Encounter: Payer: Self-pay | Admitting: Physician Assistant

## 2018-05-09 ENCOUNTER — Ambulatory Visit (INDEPENDENT_AMBULATORY_CARE_PROVIDER_SITE_OTHER): Payer: PPO | Admitting: Physician Assistant

## 2018-05-09 ENCOUNTER — Telehealth: Payer: Self-pay | Admitting: Urology

## 2018-05-09 ENCOUNTER — Encounter: Payer: PPO | Admitting: Speech Pathology

## 2018-05-09 ENCOUNTER — Ambulatory Visit (INDEPENDENT_AMBULATORY_CARE_PROVIDER_SITE_OTHER): Payer: PPO

## 2018-05-09 VITALS — BP 140/66 | HR 71 | Ht 67.0 in | Wt 196.2 lb

## 2018-05-09 DIAGNOSIS — E291 Testicular hypofunction: Secondary | ICD-10-CM

## 2018-05-09 DIAGNOSIS — R5383 Other fatigue: Secondary | ICD-10-CM

## 2018-05-09 DIAGNOSIS — R002 Palpitations: Secondary | ICD-10-CM

## 2018-05-09 DIAGNOSIS — C61 Malignant neoplasm of prostate: Secondary | ICD-10-CM | POA: Diagnosis not present

## 2018-05-09 DIAGNOSIS — I493 Ventricular premature depolarization: Secondary | ICD-10-CM

## 2018-05-09 DIAGNOSIS — R531 Weakness: Secondary | ICD-10-CM

## 2018-05-09 DIAGNOSIS — M7989 Other specified soft tissue disorders: Secondary | ICD-10-CM

## 2018-05-09 DIAGNOSIS — D649 Anemia, unspecified: Secondary | ICD-10-CM | POA: Diagnosis not present

## 2018-05-09 DIAGNOSIS — I2510 Atherosclerotic heart disease of native coronary artery without angina pectoris: Secondary | ICD-10-CM

## 2018-05-09 DIAGNOSIS — I6930 Unspecified sequelae of cerebral infarction: Secondary | ICD-10-CM | POA: Diagnosis not present

## 2018-05-09 DIAGNOSIS — R0602 Shortness of breath: Secondary | ICD-10-CM | POA: Diagnosis not present

## 2018-05-09 NOTE — Telephone Encounter (Signed)
This patient has prostate cancer, please read the message below and advise.

## 2018-05-09 NOTE — Telephone Encounter (Signed)
Pt was discharged from hospital and they prescribed Androderm patch.  Pt's daughter wants to know if there another alternative to try since he stays so tired.  They can't afford the androderm and also don't want his PSA to go up.  Please give Todd [name] a call [phone]

## 2018-05-09 NOTE — Patient Instructions (Signed)
Medication Instructions:  Continue your current medications.  If you need a refill on your cardiac medications before your next appointment, please call your pharmacy.   Lab work: Health visitor, CBC, Mag, and TSH done today.  If you have labs (blood work) drawn today and your tests are completely normal, you will receive your results only by: [name] Kitchen MyChart Message (if you have MyChart) OR . A paper copy in the mail If you have any lab test that is abnormal or we need to change your treatment, we will call you to review the results.  Testing/Procedures: Your physician has requested that you have an echocardiogram. Echocardiography is a painless test that uses sound waves to create images of your heart. It provides your doctor with information about the size and shape of your heart and how well your heart's chambers and valves are working. This procedure takes approximately one hour. There are no restrictions for this procedure.  Your physician has recommended that you wear an event monitor. Event monitors are medical devices that record the heart's electrical activity. Doctors most often [place] these monitors to diagnose arrhythmias. Arrhythmias are problems with the speed or rhythm of the heartbeat. The monitor is a small, portable device. You can wear one while you do your normal daily activities. This is usually used to diagnose what is causing palpitations/syncope (passing out).    Follow-Up: At [facility] you and your health needs are our priority.  As part of our continuing mission to provide you with exceptional heart care, we have created designated Provider Care Teams.  These Care Teams include your primary Cardiologist (physician) and Advanced Practice Providers (APPs -  Physician Assistants and Nurse Practitioners) who all work together to provide you with the care you need, when you need it. You will need a follow up appointment in 2 months.  Please call our office 2 months in advance to schedule  this appointment.  You may see Ida Rogue, MD or one of the following Advanced Practice Providers on your designated Care Team:   Murray Hodgkins, NP Christell Faith, PA-C . Marrianne Mood, PA-C  Any Other Special Instructions Will Be Listed Below (If Applicable).

## 2018-05-10 ENCOUNTER — Telehealth: Payer: Self-pay | Admitting: *Deleted

## 2018-05-10 ENCOUNTER — Ambulatory Visit (INDEPENDENT_AMBULATORY_CARE_PROVIDER_SITE_OTHER): Payer: PPO | Admitting: Family Medicine

## 2018-05-10 ENCOUNTER — Telehealth: Payer: Self-pay | Admitting: Family Medicine

## 2018-05-10 ENCOUNTER — Encounter: Payer: Self-pay | Admitting: Family Medicine

## 2018-05-10 VITALS — BP 159/79 | HR 65 | Temp 98.1°F | Wt 196.8 lb

## 2018-05-10 DIAGNOSIS — D649 Anemia, unspecified: Secondary | ICD-10-CM

## 2018-05-10 DIAGNOSIS — I69320 Aphasia following cerebral infarction: Secondary | ICD-10-CM | POA: Diagnosis not present

## 2018-05-10 DIAGNOSIS — I2510 Atherosclerotic heart disease of native coronary artery without angina pectoris: Secondary | ICD-10-CM | POA: Diagnosis not present

## 2018-05-10 DIAGNOSIS — I69354 Hemiplegia and hemiparesis following cerebral infarction affecting left non-dominant side: Secondary | ICD-10-CM | POA: Diagnosis not present

## 2018-05-10 DIAGNOSIS — G250 Essential tremor: Secondary | ICD-10-CM | POA: Diagnosis not present

## 2018-05-10 DIAGNOSIS — E538 Deficiency of other specified B group vitamins: Secondary | ICD-10-CM

## 2018-05-10 DIAGNOSIS — M545 Low back pain: Secondary | ICD-10-CM | POA: Diagnosis not present

## 2018-05-10 DIAGNOSIS — K219 Gastro-esophageal reflux disease without esophagitis: Secondary | ICD-10-CM | POA: Diagnosis not present

## 2018-05-10 DIAGNOSIS — E785 Hyperlipidemia, unspecified: Secondary | ICD-10-CM | POA: Diagnosis not present

## 2018-05-10 LAB — CBC WITH DIFFERENTIAL/PLATELET
Basophils Absolute: 0 10*3/uL (ref 0.0–0.2)
Basos: 1 %
EOS (ABSOLUTE): 0.2 10*3/uL (ref 0.0–0.4)
Eos: 4 %
Hematocrit: 27.1 % — ABNORMAL LOW (ref 37.5–51.0)
Hematocrit: 29.2 % — ABNORMAL LOW (ref 37.5–51.0)
Hemoglobin: 8.9 g/dL — ABNORMAL LOW (ref 13.0–17.7)
Hemoglobin: 9.5 g/dL — ABNORMAL LOW (ref 13.0–17.7)
Immature Grans (Abs): 0 10*3/uL (ref 0.0–0.1)
Immature Granulocytes: 1 %
Lymphocytes Absolute: 1 10*3/uL (ref 0.7–3.1)
Lymphocytes Absolute: 1.1 10*3/uL (ref 0.7–3.1)
Lymphs: 21 %
Lymphs: 22 %
MCH: 30.4 pg (ref 26.6–33.0)
MCH: 31.9 pg (ref 26.6–33.0)
MCHC: 32.8 g/dL (ref 31.5–35.7)
MCHC: 32.5 g/dL (ref 31.5–35.7)
MCV: 93 fL (ref 79–97)
MCV: 98 fL — ABNORMAL HIGH (ref 79–97)
MID (Absolute): 0.9 10*3/uL (ref 0.1–1.6)
MID: 17 %
Monocytes Absolute: 0.6 10*3/uL (ref 0.1–0.9)
Monocytes: 12 %
Neutrophils Absolute: 3.1 10*3/uL (ref 1.4–7.0)
Neutrophils Absolute: 3.3 10*3/uL (ref 1.4–7.0)
Neutrophils: 61 %
Neutrophils: 62 %
Platelets: 219 10*3/uL (ref 150–450)
Platelets: 189 10*3/uL (ref 150–450)
RBC: 2.93 x10E6/uL — ABNORMAL LOW (ref 4.14–5.80)
RBC: 2.98 x10E6/uL — ABNORMAL LOW (ref 4.14–5.80)
RDW: 11.7 % — ABNORMAL LOW (ref 12.3–15.4)
RDW: 12.6 % (ref 12.3–15.4)
WBC: 5 10*3/uL (ref 3.4–10.8)
WBC: 5.3 10*3/uL (ref 3.4–10.8)

## 2018-05-10 LAB — COMPREHENSIVE METABOLIC PANEL
ALT: 16 IU/L (ref 0–44)
AST: 24 IU/L (ref 0–40)
Albumin/Globulin Ratio: 1.7 (ref 1.2–2.2)
Albumin: 4.1 g/dL (ref 3.5–4.7)
Alkaline Phosphatase: 78 IU/L (ref 39–117)
BUN/Creatinine Ratio: 14 (ref 10–24)
BUN: 15 mg/dL (ref 8–27)
Bilirubin Total: 0.4 mg/dL (ref 0.0–1.2)
CO2: 28 mmol/L (ref 20–29)
Calcium: 9.3 mg/dL (ref 8.6–10.2)
Chloride: 101 mmol/L (ref 96–106)
Creatinine, Ser: 1.06 mg/dL (ref 0.76–1.27)
GFR calc Af Amer: 74 mL/min/{1.73_m2} (ref >59)
GFR calc non Af Amer: 64 mL/min/{1.73_m2} (ref >59)
Globulin, Total: 2.4 g/dL (ref 1.5–4.5)
Glucose: 175 mg/dL — ABNORMAL HIGH (ref 65–99)
Potassium: 4.3 mmol/L (ref 3.5–5.2)
Sodium: 145 mmol/L — ABNORMAL HIGH (ref 134–144)
Total Protein: 6.5 g/dL (ref 6.0–8.5)

## 2018-05-10 LAB — MAGNESIUM: Magnesium: 1.3 mg/dL — ABNORMAL LOW (ref 1.6–2.3)

## 2018-05-10 LAB — TSH: TSH: 0.674 u[IU]/mL (ref 0.450–4.500)

## 2018-05-10 MED ORDER — MAGNESIUM OXIDE 400 MG PO TABS: 400.0000 mg | ORAL_TABLET | Freq: Two times a day (BID) | ORAL | 6 refills | Status: DC

## 2018-05-10 MED ORDER — CYANOCOBALAMIN 1000 MCG/ML IJ SOLN
1000.0000 ug | Freq: Once | INTRAMUSCULAR | Status: AC
  Administered 2018-05-10: 1000 ug via INTRAMUSCULAR

## 2018-05-10 NOTE — Telephone Encounter (Signed)
-----   Message from Rise Mu, PA-C sent at 05/10/2018  9:41 AM EST ----- Random glucose is elevated. He should follow up with his PCP for this.  Renal function is normal.  Potassium is improved and normal.  Albumin is normal.  Liver function is normal.  HGB is down trending, suggestive of possible bleed. He needs to see his PCP regarding this ASAP. He cannot get in to be seen within 1-2 days, he needs to go to the ED.  Magnesium is low. Please start magnesium oxide 400 mg bid. Recheck magnesium in 4 weeks. This may be playing a role in his PVCs.  Thyroid function is normal.

## 2018-05-10 NOTE — Telephone Encounter (Signed)
Verbal orders given  

## 2018-05-10 NOTE — Telephone Encounter (Signed)
Copied from [place] [id number]. Topic: Quick Communication - Home Health Verbal Orders >> May 10, 2018  1:16 PM Vernona Rieger wrote: Caller/Agency: Amy, Speech Therapist with [place] Number: [phone] Requesting OT/PT/Skilled Nursing/Social Work: Speech Therapy Frequency: 1 week 1 to make up for missed visit for today 12/6

## 2018-05-10 NOTE — Telephone Encounter (Signed)
Spoke to patient's wife and informed her that he is not to be taking any Testosterone replacements. She voiced understanding.

## 2018-05-10 NOTE — Telephone Encounter (Signed)
Results called to patient's daughter, [name], ok per DPR. She verbalized understanding of the plan of care and results. She will call PCP's office when we get off the phone to see if he can be seen today; otherwise, she will take him to the ED this afternoon. She verbalized understanding for patient to take magnesium oxide 400 mg two times a day and recheck level at [facility] on 06/07/2018.

## 2018-05-10 NOTE — Progress Notes (Signed)
BP (!) 159/79   Pulse 65   Temp 98.1 F (36.7 C) (Oral)   Wt 196 lb 12.8 oz (89.3 kg)   SpO2 98%   BMI 30.82 kg/m    Subjective:    Patient ID: Todd [name], male    DOB: 1931-02-13, 84 y.o.   MRN: [id number]  HPI: Todd [name] is a 84 y.o. male  Chief Complaint  Patient presents with  . Anemia    here for low hemoglobin at cardiology yesterday   Here today following up on his abnormal CBC done yesterday through his Cardiologist. His hgb has been trending downward the past few months and was found to have gone from 9.3 - 8.9 over the course of the past 2 weeks. Pt denies any obvious sources of bleeding, diet/supplement changes, or worsening CP, SOB, weakness, or fatigue over the last few weeks. Things have been stable per patient and his daughter with those sxs which are assumed to be multifactorial.   Is awaiting starting B12 injections, just got the script sent over to the pharmacy and wanting first injection whlie he's here today for his B12 deficiency. Also taking 2 [place] vitamins. Was asked to start taking a magnesium supplement per Cardiology yesterday as well as his magnesium is low.   Relevant past medical, surgical, family and social history reviewed and updated as indicated. Interim medical history since our last visit reviewed. Allergies and medications reviewed and updated.  Review of Systems  Per HPI unless specifically indicated above     Objective:    BP (!) 159/79   Pulse 65   Temp 98.1 F (36.7 C) (Oral)   Wt 196 lb 12.8 oz (89.3 kg)   SpO2 98%   BMI 30.82 kg/m   Wt Readings from Last 3 Encounters:  05/10/18 196 lb 12.8 oz (89.3 kg)  05/09/18 196 lb 4 oz (89 kg)  04/25/18 185 lb (83.9 kg)    Physical Exam  Constitutional: He appears well-developed and well-nourished.  HENT:  Head: Atraumatic.  Eyes: Conjunctivae and EOM are normal.  Neck: Normal range of motion. Neck supple.  Cardiovascular: Normal rate, regular rhythm and normal heart  sounds.  Pulmonary/Chest: Effort normal and breath sounds normal.  Abdominal: Soft. Bowel sounds are normal. He exhibits no distension. There is no tenderness.  Musculoskeletal:  In wheelchair, ROM is at baseline for patient  Neurological: He is alert.  Skin: Skin is warm and dry.  Psychiatric: He has a normal mood and affect. His behavior is normal. Thought content normal.  Nursing note and vitals reviewed.   Results for orders placed or performed in visit on 05/10/18  CBC With Differential/Platelet  Result Value Ref Range   WBC 5.3 3.4 - 10.8 x10E3/uL   RBC 2.98 (L) 4.14 - 5.80 x10E6/uL   Hemoglobin 9.5 (L) 13.0 - 17.7 g/dL   Hematocrit 29.2 (L) 37.5 - 51.0 %   MCV 98 (H) 79 - 97 fL   MCH 31.9 26.6 - 33.0 pg   MCHC 32.5 31.5 - 35.7 g/dL   RDW 12.6 12.3 - 15.4 %   Platelets 189 150 - 450 x10E3/uL   Neutrophils 62 Not Estab. %   Lymphs 22 Not Estab. %   MID 17 Not Estab. %   Neutrophils Absolute 3.3 1.4 - 7.0 x10E3/uL   Lymphocytes Absolute 1.1 0.7 - 3.1 x10E3/uL   MID (Absolute) 0.9 0.1 - 1.6 X10E3/uL  Anemia panel  Result Value Ref Range   Total Iron Binding Capacity  215 (L) 250 - 450 ug/dL   UIBC 157 111 - 343 ug/dL   Iron 58 38 - 169 ug/dL   Iron Saturation 27 15 - 55 %   Vitamin B-12 1,023 232 - 1,245 pg/mL   Folate, Hemolysate WILL FOLLOW    Hematocrit 27.9 (L) 37.5 - 51.0 %   Folate, RBC WILL FOLLOW    Ferritin 219 30 - 400 ng/mL   Retic Ct Pct 2.9 (H) 0.6 - 2.6 %      Assessment & Plan:   Problem List Items Addressed This Visit      Other   B12 deficiency    First injection given today. Will start home injections monthly from here      Relevant Medications   cyanocobalamin ((VITAMIN B-12)) injection 1,000 mcg (Completed)    Other Visit Diagnoses    Anemia, unspecified type    -  Primary   CBC today showing some modest improvement with hgb back at 9.5, but still chronically low. Referral to Hematology sent for further management. Cont supplements    Relevant Medications   cyanocobalamin ((VITAMIN B-12)) injection 1,000 mcg (Completed)   Other Relevant Orders   CBC With Differential/Platelet (Completed)   Ambulatory referral to Hematology       Follow up plan: Return for as scheduled.

## 2018-05-10 NOTE — Telephone Encounter (Signed)
He is on Lupron for prostate cancer which is why his testosterone level is low.  Testosterone replacement is contraindicated.

## 2018-05-10 NOTE — Telephone Encounter (Signed)
OK 

## 2018-05-11 DIAGNOSIS — R0902 Hypoxemia: Secondary | ICD-10-CM | POA: Diagnosis not present

## 2018-05-11 DIAGNOSIS — I2510 Atherosclerotic heart disease of native coronary artery without angina pectoris: Secondary | ICD-10-CM | POA: Diagnosis not present

## 2018-05-11 DIAGNOSIS — I501 Left ventricular failure: Secondary | ICD-10-CM | POA: Diagnosis not present

## 2018-05-11 NOTE — Assessment & Plan Note (Signed)
First injection given today. Will start home injections monthly from here

## 2018-05-13 LAB — ANEMIA PANEL
Ferritin: 219 ng/mL (ref 30–400)
Folate, Hemolysate: 484 ng/mL
Folate, RBC: 1735 ng/mL (ref >498)
Hematocrit: 27.9 % — ABNORMAL LOW (ref 37.5–51.0)
Iron Saturation: 27 % (ref 15–55)
Iron: 58 ug/dL (ref 38–169)
Retic Ct Pct: 2.9 % — ABNORMAL HIGH (ref 0.6–2.6)
Total Iron Binding Capacity: 215 ug/dL — ABNORMAL LOW (ref 250–450)
UIBC: 157 ug/dL (ref 111–343)
Vitamin B-12: 1023 pg/mL (ref 232–1245)

## 2018-05-15 ENCOUNTER — Ambulatory Visit: Payer: PPO | Admitting: Family Medicine

## 2018-05-15 DIAGNOSIS — I351 Nonrheumatic aortic (valve) insufficiency: Secondary | ICD-10-CM | POA: Diagnosis not present

## 2018-05-15 DIAGNOSIS — Z9981 Dependence on supplemental oxygen: Secondary | ICD-10-CM | POA: Diagnosis not present

## 2018-05-15 DIAGNOSIS — I2510 Atherosclerotic heart disease of native coronary artery without angina pectoris: Secondary | ICD-10-CM | POA: Diagnosis not present

## 2018-05-15 DIAGNOSIS — E785 Hyperlipidemia, unspecified: Secondary | ICD-10-CM | POA: Diagnosis not present

## 2018-05-15 DIAGNOSIS — M545 Low back pain: Secondary | ICD-10-CM | POA: Diagnosis not present

## 2018-05-15 DIAGNOSIS — Z87891 Personal history of nicotine dependence: Secondary | ICD-10-CM | POA: Diagnosis not present

## 2018-05-15 DIAGNOSIS — E119 Type 2 diabetes mellitus without complications: Secondary | ICD-10-CM | POA: Diagnosis not present

## 2018-05-15 DIAGNOSIS — Z7984 Long term (current) use of oral hypoglycemic drugs: Secondary | ICD-10-CM | POA: Diagnosis not present

## 2018-05-15 DIAGNOSIS — Z7902 Long term (current) use of antithrombotics/antiplatelets: Secondary | ICD-10-CM | POA: Diagnosis not present

## 2018-05-15 DIAGNOSIS — I69354 Hemiplegia and hemiparesis following cerebral infarction affecting left non-dominant side: Secondary | ICD-10-CM | POA: Diagnosis not present

## 2018-05-15 DIAGNOSIS — I5031 Acute diastolic (congestive) heart failure: Secondary | ICD-10-CM | POA: Diagnosis not present

## 2018-05-15 DIAGNOSIS — I110 Hypertensive heart disease with heart failure: Secondary | ICD-10-CM | POA: Diagnosis not present

## 2018-05-15 DIAGNOSIS — Z96653 Presence of artificial knee joint, bilateral: Secondary | ICD-10-CM | POA: Diagnosis not present

## 2018-05-15 DIAGNOSIS — D500 Iron deficiency anemia secondary to blood loss (chronic): Secondary | ICD-10-CM | POA: Diagnosis not present

## 2018-05-15 DIAGNOSIS — Z7982 Long term (current) use of aspirin: Secondary | ICD-10-CM | POA: Diagnosis not present

## 2018-05-15 DIAGNOSIS — G8929 Other chronic pain: Secondary | ICD-10-CM | POA: Diagnosis not present

## 2018-05-15 DIAGNOSIS — I69320 Aphasia following cerebral infarction: Secondary | ICD-10-CM | POA: Diagnosis not present

## 2018-05-15 DIAGNOSIS — Z8546 Personal history of malignant neoplasm of prostate: Secondary | ICD-10-CM | POA: Diagnosis not present

## 2018-05-15 DIAGNOSIS — F329 Major depressive disorder, single episode, unspecified: Secondary | ICD-10-CM | POA: Diagnosis not present

## 2018-05-15 DIAGNOSIS — F419 Anxiety disorder, unspecified: Secondary | ICD-10-CM | POA: Diagnosis not present

## 2018-05-15 DIAGNOSIS — H409 Unspecified glaucoma: Secondary | ICD-10-CM | POA: Diagnosis not present

## 2018-05-15 DIAGNOSIS — K219 Gastro-esophageal reflux disease without esophagitis: Secondary | ICD-10-CM | POA: Diagnosis not present

## 2018-05-15 DIAGNOSIS — G250 Essential tremor: Secondary | ICD-10-CM | POA: Diagnosis not present

## 2018-05-16 ENCOUNTER — Other Ambulatory Visit: Payer: Self-pay | Admitting: Family Medicine

## 2018-05-17 ENCOUNTER — Ambulatory Visit: Payer: PPO

## 2018-05-17 ENCOUNTER — Other Ambulatory Visit: Payer: Self-pay | Admitting: Physician Assistant

## 2018-05-17 ENCOUNTER — Inpatient Hospital Stay: Payer: PPO | Attending: Internal Medicine | Admitting: Internal Medicine

## 2018-05-17 DIAGNOSIS — Z8673 Personal history of transient ischemic attack (TIA), and cerebral infarction without residual deficits: Secondary | ICD-10-CM | POA: Insufficient documentation

## 2018-05-17 DIAGNOSIS — Z79899 Other long term (current) drug therapy: Secondary | ICD-10-CM

## 2018-05-17 DIAGNOSIS — R5383 Other fatigue: Secondary | ICD-10-CM | POA: Diagnosis not present

## 2018-05-17 DIAGNOSIS — I493 Ventricular premature depolarization: Secondary | ICD-10-CM

## 2018-05-17 DIAGNOSIS — Z8249 Family history of ischemic heart disease and other diseases of the circulatory system: Secondary | ICD-10-CM | POA: Diagnosis not present

## 2018-05-17 DIAGNOSIS — D649 Anemia, unspecified: Secondary | ICD-10-CM | POA: Diagnosis not present

## 2018-05-17 DIAGNOSIS — R531 Weakness: Secondary | ICD-10-CM | POA: Diagnosis not present

## 2018-05-17 DIAGNOSIS — E119 Type 2 diabetes mellitus without complications: Secondary | ICD-10-CM

## 2018-05-17 DIAGNOSIS — Z7984 Long term (current) use of oral hypoglycemic drugs: Secondary | ICD-10-CM | POA: Diagnosis not present

## 2018-05-17 DIAGNOSIS — Z8549 Personal history of malignant neoplasm of other male genital organs: Secondary | ICD-10-CM

## 2018-05-17 DIAGNOSIS — Z8546 Personal history of malignant neoplasm of prostate: Secondary | ICD-10-CM | POA: Diagnosis not present

## 2018-05-17 DIAGNOSIS — R4701 Aphasia: Secondary | ICD-10-CM | POA: Diagnosis not present

## 2018-05-17 DIAGNOSIS — Z87891 Personal history of nicotine dependence: Secondary | ICD-10-CM | POA: Diagnosis not present

## 2018-05-17 DIAGNOSIS — Z7982 Long term (current) use of aspirin: Secondary | ICD-10-CM

## 2018-05-17 LAB — COMPREHENSIVE METABOLIC PANEL
ALT: 15 U/L (ref 0–44)
AST: 23 U/L (ref 15–41)
Albumin: 4.2 g/dL (ref 3.5–5.0)
Alkaline Phosphatase: 78 U/L (ref 38–126)
Anion gap: 8 (ref 5–15)
BUN: 23 mg/dL (ref 8–23)
CO2: 30 mmol/L (ref 22–32)
Calcium: 9.5 mg/dL (ref 8.9–10.3)
Chloride: 104 mmol/L (ref 98–111)
Creatinine, Ser: 1.17 mg/dL (ref 0.61–1.24)
GFR calc Af Amer: >60 mL/min (ref >60)
GFR calc non Af Amer: 57 mL/min — ABNORMAL LOW (ref >60)
Glucose, Bld: 159 mg/dL — ABNORMAL HIGH (ref 70–99)
Potassium: 4.3 mmol/L (ref 3.5–5.1)
Sodium: 142 mmol/L (ref 135–145)
Total Bilirubin: 0.6 mg/dL (ref 0.3–1.2)
Total Protein: 7.2 g/dL (ref 6.5–8.1)

## 2018-05-17 LAB — CBC WITH DIFFERENTIAL/PLATELET
Abs Immature Granulocytes: 0.04 10*3/uL (ref 0.00–0.07)
Basophils Absolute: 0 10*3/uL (ref 0.0–0.1)
Basophils Relative: 1 %
Eosinophils Absolute: 0.2 10*3/uL (ref 0.0–0.5)
Eosinophils Relative: 3 %
HCT: 29.6 % — ABNORMAL LOW (ref 39.0–52.0)
Hemoglobin: 9.2 g/dL — ABNORMAL LOW (ref 13.0–17.0)
Immature Granulocytes: 1 %
Lymphocytes Relative: 24 %
Lymphs Abs: 1.5 10*3/uL (ref 0.7–4.0)
MCH: 30.9 pg (ref 26.0–34.0)
MCHC: 31.1 g/dL (ref 30.0–36.0)
MCV: 99.3 fL (ref 80.0–100.0)
Monocytes Absolute: 0.8 10*3/uL (ref 0.1–1.0)
Monocytes Relative: 13 %
Neutro Abs: 3.6 10*3/uL (ref 1.7–7.7)
Neutrophils Relative %: 58 %
Platelets: 229 10*3/uL (ref 150–400)
RBC: 2.98 MIL/uL — ABNORMAL LOW (ref 4.22–5.81)
RDW: 12.8 % (ref 11.5–15.5)
WBC: 6.1 10*3/uL (ref 4.0–10.5)
nRBC: 0 % (ref 0.0–0.2)

## 2018-05-17 LAB — LACTATE DEHYDROGENASE: LDH: 170 U/L (ref 98–192)

## 2018-05-17 NOTE — Assessment & Plan Note (Addendum)
#  Normocytic anemia hemoglobin around 9-unclear etiology iron studies not suggestive of iron deficiency.  Repeat labs; myeloma panel review of peripheral smear; hemolysis labs.   #Discussed that bone marrow biopsy would be recommended if above work-up does not show any obvious etiology.  Discussed possibility of low-grade MDS discussed the options of treating with EPO stimulating agent.  # Stroke- right sided/aphasia-chronic.  Stable.   # Prostate cancer [> 20 years]; recent PSA normal limits.  #Right lower lobe infiltrative lesion- [November 2019]-atelectasis versus pneumonia less likely malignant nodule.  Will monitor as per recommendations.  Thank you Dr.Crissman for allowing me to participate in the care of your pleasant patient. Please do not hesitate to contact me with questions or concerns in the interim.  # # DISPOSITION: # labs today # follow up in 1 week-Dr.B

## 2018-05-17 NOTE — Progress Notes (Signed)
[facility] NOTE  Patient Care Team: Guadalupe Maple, MD as PCP - General (Family Medicine) Minna Merritts, MD as PCP - Cardiology (Cardiology) Collier Flowers, MD as Referring Physician (Urology) Birder Robson, MD as Referring Physician (Ophthalmology) Reche Dixon, PA-C (Orthopedic Surgery) Vladimir Crofts, MD (Neurology)  CHIEF COMPLAINTS/PURPOSE OF CONSULTATION: Anemia  #  Oncology History   # CHRONIC ANEMIA-since 2016; June 2019 worsened- 9-10; Iron sat- 27%;   # PROSTATE CANCER [>20 years]  # DEC 2018- stroke/Aphasia     Prostate cancer ([place])     HISTORY OF PRESENTING ILLNESS: Daughters primary-historian as patient has aphasia. Todd [name] 84 y.o.  male with a history of stroke/aphasia approximately a year ago has been referred to [place] for further evaluation recommendations for anemia.  As per the family patient has been fatigued for the last 1 year.  They deny any blood in stools or black or stools.  Denies any nausea vomiting abdominal pain.  Denies any significant weight loss.  Patient had colonoscopy- " many years ago".  No blood in urine.   Review of Systems  Constitutional: Positive for malaise/fatigue. Negative for chills, diaphoresis, fever and weight loss.  HENT: Negative for nosebleeds and sore throat.   Eyes: Negative for double vision.  Respiratory: Negative for cough, hemoptysis, sputum production, shortness of breath and wheezing.   Cardiovascular: Negative for chest pain, palpitations, orthopnea and leg swelling.  Gastrointestinal: Negative for abdominal pain, blood in stool, constipation, diarrhea, heartburn, melena, nausea and vomiting.  Genitourinary: Negative for dysuria, frequency and urgency.  Musculoskeletal: Negative for back pain and joint pain.  Skin: Negative.  Negative for itching and rash.  Neurological: Negative for dizziness, tingling, focal weakness (Chronic weakness of the right upper and lower extremity;  chronic tremors of the left upper extremity.), weakness and headaches.  Endo/Heme/Allergies: Does not bruise/bleed easily.  Psychiatric/Behavioral: Negative for depression. The patient is not nervous/anxious and does not have insomnia.      MEDICAL HISTORY:  Past Medical History:  Diagnosis Date  . Anxiety   . Aortic regurgitation    a. 03/2017 Echo: Mod AI; b. 05/2017 Echo: Triv AI.  [name] Kitchen Arthritis    osteoarthritis-left knee  . CAD (coronary artery disease)    a. Reported h/o cath ~ 2008 - ? small vessel dzs. No PCI performed; b. 03/2017 MV: EF 45-54%, no ischemia/infarct.  . Depression   . Diabetes mellitus without complication ([place])   . Diastolic dysfunction    a. 03/2017 Echo: EF 55-65%; b. 05/2017 Echo: EF 55-60%, no rwma, Gr1 DD. Triv AI.  Mildly dil LA.  . ED (erectile dysfunction)   . Elevated troponin    a. chronic mild elevation - 0.03->0.04.  [name] Kitchen GERD (gastroesophageal reflux disease)   . Glaucoma   . Hyperlipidemia   . Hypertension   . Iron deficiency anemia due to chronic blood loss   . Left pontine stroke w/ cerebrovascular disease([facility])    a. 05/2017 MRI/A: acute to subacute L pontine infarct. Chronic left thalamic lacunar infarct. Severe basilar artery stenosis w/ radiographic string sign of the distal 1/3. Mild to moderate L PCA P2 segment stenosis; b. 05/2017 Carotid U/S: <50% bilat ICA stenosis.  . Lumbago    Lumbosacral Neuritis  . Prostate cancer [facility])     SURGICAL HISTORY: Past Surgical History:  Procedure Laterality Date  . CARPAL TUNNEL RELEASE    . REPLACEMENT TOTAL KNEE BILATERAL      SOCIAL HISTORY: Social History  Socioeconomic History  . Marital status: Widowed    Spouse name: Not on file  . Number of children: Not on file  . Years of education: Not on file  . Highest education level: Not on file  Occupational History  . Occupation: retired  Scientific laboratory technician  . Financial resource strain: Not hard at all  . Food insecurity:    Worry: Never  true    Inability: Never true  . Transportation needs:    Medical: No    Non-medical: No  Tobacco Use  . Smoking status: Former Smoker    Last attempt to quit: 12/02/1994    Years since quitting: 23.4  . Smokeless tobacco: Never Used  Substance and Sexual Activity  . Alcohol use: Yes    Alcohol/week: 1.0 standard drinks    Types: 1 Cans of beer per week    Comment: occasional  . Drug use: No  . Sexual activity: Never  Lifestyle  . Physical activity:    Days per week: 0 days    Minutes per session: 0 min  . Stress: Not at all  Relationships  . Social connections:    Talks on phone: Not on file    Gets together: Not on file    Attends religious service: Not on file    Active member of club or organization: Not on file    Attends meetings of clubs or organizations: Not on file    Relationship status: Widowed  . Intimate partner violence:    Fear of current or ex partner: No    Emotionally abused: No    Physically abused: No    Forced sexual activity: No  Other Topics Concern  . Not on file  Social History Narrative   Lives at home with family.  Able to ambulate with a walker.    FAMILY HISTORY: Family History  Problem Relation Age of Onset  . Stroke Mother   . Hypertension Mother   . Hypertension Father   . Stroke Sister   . Prostate cancer Neg Hx   . Kidney cancer Neg Hx   . Bladder Cancer Neg Hx     ALLERGIES:  is allergic to amlodipine; fluoxetine; and meloxicam.  MEDICATIONS:  Current Outpatient Medications  Medication Sig Dispense Refill  . acetaminophen (TYLENOL 8 HOUR ARTHRITIS PAIN) 650 MG CR tablet Take 650 mg by mouth every 8 (eight) hours as needed for pain.    [name] Kitchen aspirin EC 81 MG tablet Take 81 mg by mouth daily.    [name] Kitchen atorvastatin (LIPITOR) 10 MG tablet Take 1 tablet (10 mg total) by mouth daily. 90 tablet 1  . Blood Glucose Monitoring Suppl (ACCU-CHEK AVIVA PLUS) w/Device KIT Use to check sugar levels x 2 daily 1 kit 0  . clopidogrel (PLAVIX) 75  MG tablet Take 1 tablet (75 mg total) by mouth daily. 90 tablet 4  . cyanocobalamin (,VITAMIN B-12,) 1000 MCG/ML injection 52m weekly x4 weeks, then 166mmonthly after that 10 mL 12  . DULoxetine (CYMBALTA) 60 MG capsule Take 1 capsule (60 mg total) by mouth daily. 90 capsule 1  . food thickener (RESOURCE THICKENUP CLEAR) POWD Follow instructions on package for nectar consistency in all liquids 1000 g 12  . gabapentin (NEURONTIN) 100 MG capsule Take 100 mg by mouth 2 (two) times daily. Once a day at night    . hydrochlorothiazide (HYDRODIURIL) 25 MG tablet Take 1 tablet (25 mg total) by mouth daily. 90 tablet 4  . Lancets (ACCU-CHEK SOFT TOUCH)  lancets Use as instructed 100 each 12  . latanoprost (XALATAN) 0.005 % ophthalmic solution Place 1 drop into both eyes at bedtime.     . magnesium oxide (MAG-OX) 400 MG tablet Take 1 tablet (400 mg total) by mouth 2 (two) times daily. 60 tablet 6  . metFORMIN (GLUCOPHAGE) 500 MG tablet Take 1 tablet (500 mg total) by mouth daily with breakfast. 180 tablet 4  . naproxen sodium (ALEVE) 220 MG tablet Take 220 mg by mouth 2 (two) times daily as needed.     . pantoprazole (PROTONIX) 40 MG tablet Take 1 tablet (40 mg total) by mouth daily. 90 tablet 3  . propranolol ER (INDERAL LA) 80 MG 24 hr capsule Take 1 capsule (80 mg total) by mouth daily. 90 capsule 4  . telmisartan (MICARDIS) 80 MG tablet Take 1 tablet (80 mg total) by mouth daily. 90 tablet 4  . hydrALAZINE (APRESOLINE) 25 MG tablet Take 1 tablet (25 mg total) by mouth 3 (three) times daily. 90 tablet 0   No current facility-administered medications for this visit.        [name] Kitchen  PHYSICAL EXAMINATION: ECOG PERFORMANCE STATUS: 1 - Symptomatic but completely ambulatory  Vitals:   05/17/18 1543  BP: (!) 145/83  Pulse: 65  Temp: (!) 97.3 F (36.3 C)   Filed Weights   05/17/18 1543  Weight: 199 lb (90.3 kg)    Physical Exam  Constitutional: He is oriented to person, place, and time and  well-developed, well-nourished, and in no distress.  Elderly male patient.  Accompanied by 2 daughters.  He is in a wheelchair.  HENT:  Head: Normocephalic and atraumatic.  Mouth/Throat: Oropharynx is clear and moist. No oropharyngeal exudate.  Eyes: Pupils are equal, round, and reactive to light.  Neck: Normal range of motion. Neck supple.  Cardiovascular: Normal rate and regular rhythm.  Pulmonary/Chest: No respiratory distress. He has no wheezes.  Abdominal: Soft. Bowel sounds are normal. He exhibits no distension and no mass. There is no abdominal tenderness. There is no rebound and no guarding.  Musculoskeletal: Normal range of motion.        General: No tenderness or edema.  Neurological: He is alert and oriented to person, place, and time.  Chronic weakness of the right upper and lower extremities; chronic tremor of the left upper extremity.  Skin: Skin is warm.  Psychiatric: Affect normal.     LABORATORY DATA:  I have reviewed the data as listed Lab Results  Component Value Date   WBC 6.1 05/17/2018   HGB 9.2 (L) 05/17/2018   HCT 29.6 (L) 05/17/2018   MCV 99.3 05/17/2018   PLT 229 05/17/2018   Recent Labs    04/20/18 1556 04/21/18 0253 05/09/18 1511 05/17/18 1625  NA 140 140 145* 142  K 4.2 3.3* 4.3 4.3  CL 103 101 101 104  CO2 27 32 28 30  GLUCOSE 268* 148* 175* 159*  BUN 20 21 15 23   CREATININE 0.93 1.00 1.06 1.17  CALCIUM 9.1 9.1 9.3 9.5  GFRNONAA >60 >60 64 57*  GFRAA >60 >60 74 >60  PROT 7.0  --  6.5 7.2  ALBUMIN 4.0  --  4.1 4.2  AST 22  --  24 23  ALT 12  --  16 15  ALKPHOS 64  --  78 78  BILITOT 0.8  --  0.4 0.6    RADIOGRAPHIC STUDIES: I have personally reviewed the radiological images as listed and agreed with the findings in the  report. Dg Chest 2 View  Result Date: 04/20/2018 CLINICAL DATA:  Weakness EXAM: CHEST - 2 VIEW COMPARISON:  12/17/2017 FINDINGS: Cardiomegaly with vascular congestion. Mild elevation of the right hemidiaphragm,  stable. No edema. Bibasilar atelectasis. No visible effusions. No acute bony abnormality. IMPRESSION: Cardiomegaly with vascular congestion.  Bibasilar atelectasis. Electronically Signed   By: Rolm Baptise M.D.   On: 04/20/2018 16:28   Ct Head Wo Contrast  Result Date: 04/20/2018 CLINICAL DATA:  Altered level of consciousness EXAM: CT HEAD WITHOUT CONTRAST TECHNIQUE: Contiguous axial images were obtained from the base of the skull through the vertex without intravenous contrast. COMPARISON:  MRI brain dated 05/18/2017 FINDINGS: Brain: No evidence of acute infarction, hemorrhage, hydrocephalus, extra-axial collection or mass lesion/mass effect. Subcortical white matter and periventricular small vessel ischemic changes. Vascular: Intracranial atherosclerosis. Skull: Normal. Negative for fracture or focal lesion. Sinuses/Orbits: The visualized paranasal sinuses are essentially clear. The mastoid air cells are unopacified. Other: None. IMPRESSION: No evidence of acute intracranial abnormality. Small vessel ischemic changes. Electronically Signed   By: Julian Hy M.D.   On: 04/20/2018 16:32   Ct Angio Chest Pe W Or Wo Contrast  Result Date: 04/22/2018 CLINICAL DATA:  84 year old male with shortness of breath, hypoxia with exertion. EXAM: CT ANGIOGRAPHY CHEST WITH CONTRAST TECHNIQUE: Multidetector CT imaging of the chest was performed using the standard protocol during bolus administration of intravenous contrast. Multiplanar CT image reconstructions and MIPs were obtained to evaluate the vascular anatomy. CONTRAST:  48m OMNIPAQUE IOHEXOL 350 MG/ML SOLN COMPARISON:  Chest radiographs 04/20/2018 and earlier. Lumbar MRI 04/11/2017. FINDINGS: Cardiovascular: Excellent contrast bolus timing in the pulmonary arterial tree. Mild respiratory motion. No focal filling defect identified in the pulmonary arteries to suggest acute pulmonary embolism. Mild cardiomegaly. Widespread calcified coronary artery  atherosclerosis and/or stents. No pericardial effusion. Lesser Calcified aortic atherosclerosis. Mediastinum/Nodes: No mediastinal lymphadenopathy. Lungs/Pleura: Major airways are patent. There is confluent opacity throughout the medial aspect of the right lower lobe from the superior segment to the medial basal segment. A portion of this enhances such as due to atelectasis, but some of it appears nonenhancing. There are some air bronchograms. There is no associated pleural effusion. There is some nearby patchy opacity in the posterior right costophrenic angle. There is curvilinear atelectasis or scarring in the medial segment of the right middle lobe. There is minor left lower lobe atelectasis. Upper Abdomen: The partially visible gallbladder appears distended (10 centimeters in length), but otherwise within normal limits. Negative visible liver, spleen, adrenal glands. There is some oral contrast in the stomach. Musculoskeletal: Widespread thoracic spine ankylosis related to flowing endplate osteophytes or syndesmophytes. The lumbar spine did not appear similarly ankylosed in 2018. Bulky midthoracic osteophytosis with vacuum phenomena. No acute osseous abnormality identified. Advanced degenerative changes at the glenohumeral joints. Review of the MIP images confirms the above findings. IMPRESSION: 1. Negative for acute pulmonary embolus. 2. Confluent opacity in the medial aspect of the right lower lobe is nonspecific and might represent a combination of atelectasis and pneumonia. No pleural effusion. Has the patient had any prior radiation treatment to the right chest? 3. No other acute pulmonary process. 4. Distended gallbladder. If there is right upper quadrant pain then follow-up ultrasound would be recommended. 5. Calcified coronary artery atherosclerosis and mild cardiomegaly. 6. Widespread thoracic spine ankylosis, favor Diffuse idiopathic skeletal hyperostosis (DISH). Electronically Signed   By: HGenevie Ann M.D.   On: 04/22/2018 17:27    ASSESSMENT & PLAN:   Normocytic anemia #Normocytic  anemia hemoglobin around 9-unclear etiology iron studies not suggestive of iron deficiency.  Repeat labs; myeloma panel review of peripheral smear; hemolysis labs.   #Discussed that bone marrow biopsy would be recommended if above work-up does not show any obvious etiology.  Discussed possibility of low-grade MDS discussed the options of treating with EPO stimulating agent.  # Stroke- right sided/aphasia-chronic.  Stable.   # Prostate cancer [> 20 years]; recent PSA normal limits.  #Right lower lobe infiltrative lesion- [November 2019]-atelectasis versus pneumonia less likely malignant nodule.  Will monitor as per recommendations.  Thank you Dr.Crissman for allowing me to participate in the care of your pleasant patient. Please do not hesitate to contact me with questions or concerns in the interim.  # # DISPOSITION: # labs today # follow up in 1 week-Dr.B   All questions were answered. The patient knows to call the clinic with any problems, questions or concerns.    Cammie Sickle, MD 05/19/2018 8:05 PM

## 2018-05-18 LAB — ERYTHROPOIETIN: Erythropoietin: 19.4 m[IU]/mL — ABNORMAL HIGH (ref 2.6–18.5)

## 2018-05-20 ENCOUNTER — Other Ambulatory Visit: Payer: Self-pay | Admitting: *Deleted

## 2018-05-20 ENCOUNTER — Telehealth: Payer: Self-pay | Admitting: *Deleted

## 2018-05-20 LAB — KAPPA/LAMBDA LIGHT CHAINS
Kappa free light chain: 28.4 mg/L — ABNORMAL HIGH (ref 3.3–19.4)
Kappa, lambda light chain ratio: 2.18 — ABNORMAL HIGH (ref 0.26–1.65)
Lambda free light chains: 13 mg/L (ref 5.7–26.3)

## 2018-05-20 MED ORDER — PROPRANOLOL HCL ER 120 MG PO CP24: 120.0000 mg | ORAL_CAPSULE | Freq: Every day | ORAL | 3 refills | Status: DC

## 2018-05-20 NOTE — Progress Notes (Unsigned)
hap

## 2018-05-20 NOTE — Telephone Encounter (Signed)
-----   Message from Rise Mu, PA-C sent at 05/19/2018  3:17 PM EST ----- Heart monitor showed an overall sinus rhythm with an average heart rate of 68 bpm. There were 1639 runs of atrial tachycardia/SVT with the fastest run lasting 5 beats with a max rate of 179 bpm. The longest run lasted 13 beats with an average rate of 104 bpm. There were occasional isolated PACs, atrial couplets, and rate atrial triplets. There were also frequent PVCs (8.2% burden), rare ventricular couplets, no ventricular triplets. Ventricular bigeminy and trigeminy were noted.   He is scheduled for echo later this month, I recommend he keep that.  Given his atrial and ventricular ectopy burden, I recommend we increase his propranolol to 120 mg daily.  We will need to discuss potential ischemic evaluation with the patient and his family at follow up pending his echo results.  We will recheck a magnesium at his follow up visit as well.

## 2018-05-20 NOTE — Telephone Encounter (Signed)
Spoke with patient's daughter, [name], ok per DPR. She verbalized understanding of the results and plan of care. Verbalized understanding to increase propranolol to 120 mg once a day and to keep follow up appointments as scheduled.  Rx sent to pharmacy.

## 2018-05-21 LAB — HAPTOGLOBIN: Haptoglobin: 165 mg/dL (ref 38–329)

## 2018-05-22 LAB — MULTIPLE MYELOMA PANEL, SERUM
Albumin SerPl Elph-Mcnc: 3.8 g/dL (ref 2.9–4.4)
Albumin/Glob SerPl: 1.4 (ref 0.7–1.7)
Alpha 1: 0.2 g/dL (ref 0.0–0.4)
Alpha2 Glob SerPl Elph-Mcnc: 0.8 g/dL (ref 0.4–1.0)
B-Globulin SerPl Elph-Mcnc: 0.9 g/dL (ref 0.7–1.3)
Gamma Glob SerPl Elph-Mcnc: 0.9 g/dL (ref 0.4–1.8)
Globulin, Total: 2.9 g/dL (ref 2.2–3.9)
IgA: 157 mg/dL (ref 61–437)
IgG (Immunoglobin G), Serum: 1022 mg/dL (ref 700–1600)
IgM (Immunoglobulin M), Srm: 39 mg/dL (ref 15–143)
Total Protein ELP: 6.7 g/dL (ref 6.0–8.5)

## 2018-05-27 ENCOUNTER — Other Ambulatory Visit: Payer: Self-pay

## 2018-05-27 ENCOUNTER — Inpatient Hospital Stay (HOSPITAL_BASED_OUTPATIENT_CLINIC_OR_DEPARTMENT_OTHER): Payer: PPO | Admitting: Internal Medicine

## 2018-05-27 VITALS — BP 127/62 | HR 67 | Temp 97.6°F | Resp 20 | Ht 67.0 in | Wt 199.0 lb

## 2018-05-27 DIAGNOSIS — Z7982 Long term (current) use of aspirin: Secondary | ICD-10-CM | POA: Diagnosis not present

## 2018-05-27 DIAGNOSIS — R4701 Aphasia: Secondary | ICD-10-CM | POA: Diagnosis not present

## 2018-05-27 DIAGNOSIS — Z8546 Personal history of malignant neoplasm of prostate: Secondary | ICD-10-CM | POA: Diagnosis not present

## 2018-05-27 DIAGNOSIS — Z8673 Personal history of transient ischemic attack (TIA), and cerebral infarction without residual deficits: Secondary | ICD-10-CM

## 2018-05-27 DIAGNOSIS — D649 Anemia, unspecified: Secondary | ICD-10-CM

## 2018-05-27 DIAGNOSIS — Z87891 Personal history of nicotine dependence: Secondary | ICD-10-CM

## 2018-05-27 DIAGNOSIS — Z79899 Other long term (current) drug therapy: Secondary | ICD-10-CM | POA: Diagnosis not present

## 2018-05-27 DIAGNOSIS — R531 Weakness: Secondary | ICD-10-CM | POA: Diagnosis not present

## 2018-05-27 DIAGNOSIS — Z7984 Long term (current) use of oral hypoglycemic drugs: Secondary | ICD-10-CM

## 2018-05-27 DIAGNOSIS — R5383 Other fatigue: Secondary | ICD-10-CM

## 2018-05-27 DIAGNOSIS — E119 Type 2 diabetes mellitus without complications: Secondary | ICD-10-CM | POA: Diagnosis not present

## 2018-05-27 DIAGNOSIS — Z8249 Family history of ischemic heart disease and other diseases of the circulatory system: Secondary | ICD-10-CM

## 2018-05-27 NOTE — Progress Notes (Signed)
[facility] NOTE  Patient Care Team: Guadalupe Maple, MD as PCP - General (Family Medicine) Minna Merritts, MD as PCP - Cardiology (Cardiology) Collier Flowers, MD as Referring Physician (Urology) Birder Robson, MD as Referring Physician (Ophthalmology) Reche Dixon, PA-C (Orthopedic Surgery) Vladimir Crofts, MD (Neurology)  CHIEF COMPLAINTS/PURPOSE OF CONSULTATION: Anemia  #  Oncology History   # CHRONIC ANEMIA-since 2016; June 2019 worsened- 9-10; Iron sat- 27%; NOT IDA/ no hemolysis; monoclonal work-up negative; B12 normal slightly low seen.  Question MDS-reluctant bone marrow biopsy  # Nov 2019- RLL- ? Infiltrate; repeat in 3 months.   # PROSTATE CANCER [>20 years]  # DEC 2018- stroke/Aphasia     Prostate cancer ([place])     HISTORY OF PRESENTING ILLNESS: Daughters primary-historian as patient has aphasia. Todd [name] 84 y.o.  male with a history of stroke/aphasia; and also history of chronic anemia for the last 1 year or so is here today with results of his work-up/ next plan Of care.  Patient continues to be fatigued.  Denies any blood in stools or black or stools.   Review of Systems  Constitutional: Positive for malaise/fatigue. Negative for chills, diaphoresis, fever and weight loss.  HENT: Negative for nosebleeds and sore throat.   Eyes: Negative for double vision.  Respiratory: Negative for cough, hemoptysis, sputum production, shortness of breath and wheezing.   Cardiovascular: Negative for chest pain, palpitations, orthopnea and leg swelling.  Gastrointestinal: Negative for abdominal pain, blood in stool, constipation, diarrhea, heartburn, melena, nausea and vomiting.  Genitourinary: Negative for dysuria, frequency and urgency.  Musculoskeletal: Negative for back pain and joint pain.  Skin: Negative.  Negative for itching and rash.  Neurological: Negative for dizziness, tingling, focal weakness (Chronic weakness of the right upper and  lower extremity; chronic tremors of the left upper extremity.), weakness and headaches.  Endo/Heme/Allergies: Does not bruise/bleed easily.  Psychiatric/Behavioral: Negative for depression. The patient is not nervous/anxious and does not have insomnia.      MEDICAL HISTORY:  Past Medical History:  Diagnosis Date  . Anxiety   . Aortic regurgitation    a. 03/2017 Echo: Mod AI; b. 05/2017 Echo: Triv AI.  [name] Kitchen Arthritis    osteoarthritis-left knee  . CAD (coronary artery disease)    a. Reported h/o cath ~ 2008 - ? small vessel dzs. No PCI performed; b. 03/2017 MV: EF 45-54%, no ischemia/infarct.  . Depression   . Diabetes mellitus without complication ([place])   . Diastolic dysfunction    a. 03/2017 Echo: EF 55-65%; b. 05/2017 Echo: EF 55-60%, no rwma, Gr1 DD. Triv AI.  Mildly dil LA.  . ED (erectile dysfunction)   . Elevated troponin    a. chronic mild elevation - 0.03->0.04.  [name] Kitchen GERD (gastroesophageal reflux disease)   . Glaucoma   . Hyperlipidemia   . Hypertension   . Iron deficiency anemia due to chronic blood loss   . Left pontine stroke w/ cerebrovascular disease(H[facility])    a. 05/2017 MRI/A: acute to subacute L pontine infarct. Chronic left thalamic lacunar infarct. Severe basilar artery stenosis w/ radiographic string sign of the distal 1/3. Mild to moderate L PCA P2 segment stenosis; b. 05/2017 Carotid U/S: <50% bilat ICA stenosis.  . Lumbago    Lumbosacral Neuritis  . Prostate cancer [facility])     SURGICAL HISTORY: Past Surgical History:  Procedure Laterality Date  . CARPAL TUNNEL RELEASE    . REPLACEMENT TOTAL KNEE BILATERAL      SOCIAL  HISTORY: Social History   Socioeconomic History  . Marital status: Widowed    Spouse name: Not on file  . Number of children: Not on file  . Years of education: Not on file  . Highest education level: Not on file  Occupational History  . Occupation: retired  Scientific laboratory technician  . Financial resource strain: Not hard at all  . Food insecurity:     Worry: Never true    Inability: Never true  . Transportation needs:    Medical: No    Non-medical: No  Tobacco Use  . Smoking status: Former Smoker    Last attempt to quit: 12/02/1994    Years since quitting: 23.4  . Smokeless tobacco: Never Used  Substance and Sexual Activity  . Alcohol use: Yes    Alcohol/week: 1.0 standard drinks    Types: 1 Cans of beer per week    Comment: occasional  . Drug use: No  . Sexual activity: Never  Lifestyle  . Physical activity:    Days per week: 0 days    Minutes per session: 0 min  . Stress: Not at all  Relationships  . Social connections:    Talks on phone: Not on file    Gets together: Not on file    Attends religious service: Not on file    Active member of club or organization: Not on file    Attends meetings of clubs or organizations: Not on file    Relationship status: Widowed  . Intimate partner violence:    Fear of current or ex partner: No    Emotionally abused: No    Physically abused: No    Forced sexual activity: No  Other Topics Concern  . Not on file  Social History Narrative   Lives at home with family.  Able to ambulate with a walker.    FAMILY HISTORY: Family History  Problem Relation Age of Onset  . Stroke Mother   . Hypertension Mother   . Hypertension Father   . Stroke Sister   . Prostate cancer Neg Hx   . Kidney cancer Neg Hx   . Bladder Cancer Neg Hx     ALLERGIES:  is allergic to amlodipine; fluoxetine; and meloxicam.  MEDICATIONS:  Current Outpatient Medications  Medication Sig Dispense Refill  . acetaminophen (TYLENOL 8 HOUR ARTHRITIS PAIN) 650 MG CR tablet Take 650 mg by mouth every 8 (eight) hours as needed for pain.    [name] Kitchen aspirin EC 81 MG tablet Take 81 mg by mouth daily.    [name] Kitchen atorvastatin (LIPITOR) 10 MG tablet Take 1 tablet (10 mg total) by mouth daily. 90 tablet 1  . Blood Glucose Monitoring Suppl (ACCU-CHEK AVIVA PLUS) w/Device KIT Use to check sugar levels x 2 daily 1 kit 0  . clopidogrel  (PLAVIX) 75 MG tablet Take 1 tablet (75 mg total) by mouth daily. 90 tablet 4  . cyanocobalamin (,VITAMIN B-12,) 1000 MCG/ML injection 4m weekly x4 weeks, then 144mmonthly after that 10 mL 12  . DULoxetine (CYMBALTA) 60 MG capsule Take 1 capsule (60 mg total) by mouth daily. 90 capsule 1  . food thickener (RESOURCE THICKENUP CLEAR) POWD Follow instructions on package for nectar consistency in all liquids 1000 g 12  . gabapentin (NEURONTIN) 100 MG capsule Take 100 mg by mouth 2 (two) times daily. Once a day at night    . hydrALAZINE (APRESOLINE) 25 MG tablet Take 1 tablet (25 mg total) by mouth 3 (three) times daily. 90[place]  tablet 0  . hydrochlorothiazide (HYDRODIURIL) 25 MG tablet Take 1 tablet (25 mg total) by mouth daily. 90 tablet 4  . Lancets (ACCU-CHEK SOFT TOUCH) lancets Use as instructed 100 each 12  . latanoprost (XALATAN) 0.005 % ophthalmic solution Place 1 drop into both eyes at bedtime.     . magnesium oxide (MAG-OX) 400 MG tablet Take 1 tablet (400 mg total) by mouth 2 (two) times daily. 60 tablet 6  . metFORMIN (GLUCOPHAGE) 500 MG tablet Take 1 tablet (500 mg total) by mouth daily with breakfast. 180 tablet 4  . naproxen sodium (ALEVE) 220 MG tablet Take 220 mg by mouth 2 (two) times daily as needed.     . pantoprazole (PROTONIX) 40 MG tablet Take 1 tablet (40 mg total) by mouth daily. 90 tablet 3  . propranolol ER (INDERAL LA) 120 MG 24 hr capsule Take 1 capsule (120 mg total) by mouth daily. 90 capsule 3  . telmisartan (MICARDIS) 80 MG tablet Take 1 tablet (80 mg total) by mouth daily. 90 tablet 4   No current facility-administered medications for this visit.        [name] Kitchen  PHYSICAL EXAMINATION: ECOG PERFORMANCE STATUS: 1 - Symptomatic but completely ambulatory  Vitals:   05/27/18 1429  BP: 127/62  Pulse: 67  Resp: 20  Temp: 97.6 F (36.4 C)   Filed Weights   05/27/18 1426  Weight: 199 lb (90.3 kg)    Physical Exam  Constitutional: He is oriented to person, place, and  time and well-developed, well-nourished, and in no distress.  Elderly male patient.  Accompanied by 1 daughter.   He is in a wheelchair.  HENT:  Head: Normocephalic and atraumatic.  Mouth/Throat: Oropharynx is clear and moist. No oropharyngeal exudate.  Eyes: Pupils are equal, round, and reactive to light.  Neck: Normal range of motion. Neck supple.  Cardiovascular: Normal rate and regular rhythm.  Pulmonary/Chest: No respiratory distress. He has no wheezes.  Abdominal: Soft. Bowel sounds are normal. He exhibits no distension and no mass. There is no abdominal tenderness. There is no rebound and no guarding.  Musculoskeletal: Normal range of motion.        General: No tenderness or edema.  Neurological: He is alert and oriented to person, place, and time.  Chronic weakness of the right upper and lower extremities; chronic tremor of the left upper extremity.  Skin: Skin is warm.  Psychiatric: Affect normal.     LABORATORY DATA:  I have reviewed the data as listed Lab Results  Component Value Date   WBC 6.1 05/17/2018   HGB 9.2 (L) 05/17/2018   HCT 29.6 (L) 05/17/2018   MCV 99.3 05/17/2018   PLT 229 05/17/2018   Recent Labs    04/20/18 1556 04/21/18 0253 05/09/18 1511 05/17/18 1625  NA 140 140 145* 142  K 4.2 3.3* 4.3 4.3  CL 103 101 101 104  CO2 27 32 28 30  GLUCOSE 268* 148* 175* 159*  BUN 20 21 15 23   CREATININE 0.93 1.00 1.06 1.17  CALCIUM 9.1 9.1 9.3 9.5  GFRNONAA >60 >60 64 57*  GFRAA >60 >60 74 >60  PROT 7.0  --  6.5 7.2  ALBUMIN 4.0  --  4.1 4.2  AST 22  --  24 23  ALT 12  --  16 15  ALKPHOS 64  --  78 78  BILITOT 0.8  --  0.4 0.6    RADIOGRAPHIC STUDIES: I have personally reviewed the radiological images as listed  and agreed with the findings in the report. No results found.  ASSESSMENT & PLAN:   Normocytic anemia #Normocytic anemia hemoglobin around 9-unclear etiology.  Discussed regarding possible MDS given his age.   #Discussed regarding getting  a bone marrow biopsy for further evaluation/possible low-grade MDS.  Discussed that if patient had low-grade MDS-he would be a candidate for erythropoietin stimulating agents.  Given his age/comorbidities I think he is not a candidate for any hypo-methylating agents.  # Stroke- right sided/aphasia-chronic.  Stable.   #Right lower lobe infiltrative lesion- [November 2019]-atelectasis versus pneumonia less likely malignant nodule.  We will repeat imaging in the next 2 months.  Will order at next visit.  #Fatigue-question secondary to ongoing anemia.  #Patient daughter states that she wants to discuss regarding bone marrow biopsy with rest of her family; and inform us if interested.  # DISPOSITION: # follow up in 6 weeks-cbc/bmp/hold tube- Dr.B   All questions were answered. The patient knows to call the clinic with any problems, questions or concerns.    Cammie Sickle, MD 05/27/2018 4:18 PM

## 2018-05-27 NOTE — Assessment & Plan Note (Addendum)
#  Normocytic anemia hemoglobin around 9-unclear etiology.  Discussed regarding possible MDS given his age.   #Discussed regarding getting a bone marrow biopsy for further evaluation/possible low-grade MDS.  Discussed that if patient had low-grade MDS-he would be a candidate for erythropoietin stimulating agents.  Given his age/comorbidities I think he is not a candidate for any hypo-methylating agents.  # Stroke- right sided/aphasia-chronic.  Stable.   #Right lower lobe infiltrative lesion- [November 2019]-atelectasis versus pneumonia less likely malignant nodule.  We will repeat imaging in the next 2 months.  Will order at next visit.  #Fatigue-question secondary to ongoing anemia.  #Patient daughter states that she wants to discuss regarding bone marrow biopsy with rest of her family; and inform us if interested.  # DISPOSITION: # follow up in 6 weeks-cbc/bmp/hold tube- Dr.B

## 2018-06-03 ENCOUNTER — Other Ambulatory Visit: Payer: Self-pay

## 2018-06-03 ENCOUNTER — Ambulatory Visit (INDEPENDENT_AMBULATORY_CARE_PROVIDER_SITE_OTHER): Payer: PPO

## 2018-06-03 DIAGNOSIS — R531 Weakness: Secondary | ICD-10-CM

## 2018-06-03 DIAGNOSIS — I493 Ventricular premature depolarization: Secondary | ICD-10-CM | POA: Diagnosis not present

## 2018-06-06 ENCOUNTER — Telehealth: Payer: Self-pay

## 2018-06-06 NOTE — Telephone Encounter (Signed)
Call attempted. No answer.   

## 2018-06-06 NOTE — Telephone Encounter (Signed)
-----   Message from Rise Mu, PA-C sent at 06/04/2018  7:10 AM EST ----- Echo showed normal pump function with normal wall motion.  Slightly stiffened heart.  Trivially leaky aortic valve.  Mildly dilated aortic root with a mildly dilated left atrium.  Right side of the heart pump function was normal.  Pressure in the vessels going to the lungs was normal.  Extra beats from the bottom portion of the heart were noted.  Overall relatively reassuring study.  Keep planned follow-up.

## 2018-06-10 NOTE — Telephone Encounter (Signed)
I spoke with the patient regarding his echo results. I did remind him of his follow up appointment on 07/11/18 with Dr. Rockey Situ.

## 2018-06-11 DIAGNOSIS — I2510 Atherosclerotic heart disease of native coronary artery without angina pectoris: Secondary | ICD-10-CM | POA: Diagnosis not present

## 2018-06-11 DIAGNOSIS — R0902 Hypoxemia: Secondary | ICD-10-CM | POA: Diagnosis not present

## 2018-06-11 DIAGNOSIS — I501 Left ventricular failure: Secondary | ICD-10-CM | POA: Diagnosis not present

## 2018-06-21 ENCOUNTER — Other Ambulatory Visit: Payer: Self-pay | Admitting: Family Medicine

## 2018-06-21 ENCOUNTER — Telehealth: Payer: Self-pay

## 2018-06-21 DIAGNOSIS — E538 Deficiency of other specified B group vitamins: Secondary | ICD-10-CM

## 2018-06-21 MED ORDER — CYANOCOBALAMIN 1000 MCG/ML IJ SOLN
1000.0000 ug | INTRAMUSCULAR | Status: DC
  Administered 2018-06-24 – 2018-07-24 (×2): 1000 ug via INTRAMUSCULAR

## 2018-06-21 NOTE — Telephone Encounter (Signed)
Called and left a message with the family family letting them know that the  patient can come into the office monthly to have his B-12 injections.   Dr.Crissman can you please sign off on the b 12 injections  Copied from [place] [id number]. Topic: General - Other >> Jun 21, 2018  1:58 PM Todd [name] wrote: Reason for CRM: pt had first b12 injection on 05-10-2018. Per office note on 05-10-18 pt will get b12 injections monthy  at home. Pt does not have anyone to administer the injections. Pt would like to come to office to get b12 injection. Please call norma or gretta pt daughters back >> Jun 21, 2018  2:18 PM Jill Side wrote: Please advise

## 2018-06-24 ENCOUNTER — Ambulatory Visit (INDEPENDENT_AMBULATORY_CARE_PROVIDER_SITE_OTHER): Payer: PPO

## 2018-06-24 DIAGNOSIS — E538 Deficiency of other specified B group vitamins: Secondary | ICD-10-CM

## 2018-06-26 ENCOUNTER — Encounter: Payer: Self-pay | Admitting: Family Medicine

## 2018-06-26 ENCOUNTER — Ambulatory Visit (INDEPENDENT_AMBULATORY_CARE_PROVIDER_SITE_OTHER): Payer: PPO | Admitting: Family Medicine

## 2018-06-26 VITALS — BP 146/71 | HR 69 | Temp 97.9°F | Ht 67.0 in | Wt 199.0 lb

## 2018-06-26 DIAGNOSIS — R0602 Shortness of breath: Secondary | ICD-10-CM | POA: Diagnosis not present

## 2018-06-26 DIAGNOSIS — H938X3 Other specified disorders of ear, bilateral: Secondary | ICD-10-CM | POA: Diagnosis not present

## 2018-06-26 NOTE — Progress Notes (Signed)
BP (!) 146/71   Pulse 69   Temp 97.9 F (36.6 C) (Oral)   Ht 5' 7"  (1.702 m)   Wt 199 lb (90.3 kg)   SpO2 99%   BMI 31.17 kg/m    Subjective:    Patient ID: Todd [name], male    DOB: [birth year], 85 y.o.   MRN: [id number]  HPI: Todd [name] is a 85 y.o. male  Chief Complaint  Patient presents with  . Ear Fullness    Bilateral   Here today because daughter noticed some wax build up in his ears recently. Has had to get them flushed out before. Not c/o any pain or hearing issues, just some mild fullness.   Requesting a portable O2 tank. Having to wear continuous O2 at 3L currently and daughter struggling to tote around full tank during appts and errands.   Relevant past medical, surgical, family and social history reviewed and updated as indicated. Interim medical history since our last visit reviewed. Allergies and medications reviewed and updated.  Review of Systems  Per HPI unless specifically indicated above     Objective:    BP (!) 146/71   Pulse 69   Temp 97.9 F (36.6 C) (Oral)   Ht 5' 7"  (1.702 m)   Wt 199 lb (90.3 kg)   SpO2 99%   BMI 31.17 kg/m   Wt Readings from Last 3 Encounters:  06/26/18 199 lb (90.3 kg)  05/27/18 199 lb (90.3 kg)  05/17/18 199 lb (90.3 kg)    Physical Exam Vitals signs and nursing note reviewed.  Constitutional:      Appearance: Normal appearance.  HENT:     Head: Atraumatic.     Comments: Minimal cerumen present in b/l EACs    Right Ear: Tympanic membrane normal.     Left Ear: Tympanic membrane normal.  Eyes:     Extraocular Movements: Extraocular movements intact.     Conjunctiva/sclera: Conjunctivae normal.  Neck:     Musculoskeletal: Normal range of motion and neck supple.  Cardiovascular:     Rate and Rhythm: Normal rate and regular rhythm.  Pulmonary:     Effort: Pulmonary effort is normal.     Breath sounds: Normal breath sounds.     Comments: On continuous O2 via nasal cannula Musculoskeletal:     Comments:  ROM at baseline  Skin:    General: Skin is warm and dry.  Neurological:     Mental Status: Mental status is at baseline.  Psychiatric:        Mood and Affect: Mood normal.        Thought Content: Thought content normal.        Judgment: Judgment normal.    Procedure: B/l ear lavage Procedure explained, questions answered adequately.  Warm water and lavage bottle used to flush out small amount of wax present in b/l EACs. No complications noted, tolerated well. Both TMs fully visible and benign.   Results for orders placed or performed in visit on 05/17/18  Erythropoietin  Result Value Ref Range   Erythropoietin 19.4 (H) 2.6 - 18.5 mIU/mL  Kappa/lambda light chains  Result Value Ref Range   Kappa free light chain 28.4 (H) 3.3 - 19.4 mg/L   Lamda free light chains 13.0 5.7 - 26.3 mg/L   Kappa, lamda light chain ratio 2.18 (H) 0.26 - 1.65  Multiple Myeloma Panel (SPEP&IFE w/QIG)  Result Value Ref Range   IgG (Immunoglobin G), Serum 1,022 700 - 1,600 mg/dL  IgA 157 61 - 437 mg/dL   IgM (Immunoglobulin M), Srm 39 15 - 143 mg/dL   Total Protein ELP 6.7 6.0 - 8.5 g/dL   Albumin SerPl Elph-Mcnc 3.8 2.9 - 4.4 g/dL   Alpha 1 0.2 0.0 - 0.4 g/dL   Alpha2 Glob SerPl Elph-Mcnc 0.8 0.4 - 1.0 g/dL   B-Globulin SerPl Elph-Mcnc 0.9 0.7 - 1.3 g/dL   Gamma Glob SerPl Elph-Mcnc 0.9 0.4 - 1.8 g/dL   M Protein SerPl Elph-Mcnc Not Observed Not Observed g/dL   Globulin, Total 2.9 2.2 - 3.9 g/dL   Albumin/Glob SerPl 1.4 0.7 - 1.7   IFE 1 Comment    Please Note Comment   Lactate dehydrogenase  Result Value Ref Range   LDH 170 98 - 192 U/L  CBC with Differential/Platelet  Result Value Ref Range   WBC 6.1 4.0 - 10.5 K/uL   RBC 2.98 (L) 4.22 - 5.81 MIL/uL   Hemoglobin 9.2 (L) 13.0 - 17.0 g/dL   HCT 29.6 (L) 39.0 - 52.0 %   MCV 99.3 80.0 - 100.0 fL   MCH 30.9 26.0 - 34.0 pg   MCHC 31.1 30.0 - 36.0 g/dL   RDW 12.8 11.5 - 15.5 %   Platelets 229 150 - 400 K/uL   nRBC 0.0 0.0 - 0.2 %    Neutrophils Relative % 58 %   Neutro Abs 3.6 1.7 - 7.7 K/uL   Lymphocytes Relative 24 %   Lymphs Abs 1.5 0.7 - 4.0 K/uL   Monocytes Relative 13 %   Monocytes Absolute 0.8 0.1 - 1.0 K/uL   Eosinophils Relative 3 %   Eosinophils Absolute 0.2 0.0 - 0.5 K/uL   Basophils Relative 1 %   Basophils Absolute 0.0 0.0 - 0.1 K/uL   Immature Granulocytes 1 %   Abs Immature Granulocytes 0.04 0.00 - 0.07 K/uL  Comprehensive metabolic panel  Result Value Ref Range   Sodium 142 135 - 145 mmol/L   Potassium 4.3 3.5 - 5.1 mmol/L   Chloride 104 98 - 111 mmol/L   CO2 30 22 - 32 mmol/L   Glucose, Bld 159 (H) 70 - 99 mg/dL   BUN 23 8 - 23 mg/dL   Creatinine, Ser 1.17 0.61 - 1.24 mg/dL   Calcium 9.5 8.9 - 10.3 mg/dL   Total Protein 7.2 6.5 - 8.1 g/dL   Albumin 4.2 3.5 - 5.0 g/dL   AST 23 15 - 41 U/L   ALT 15 0 - 44 U/L   Alkaline Phosphatase 78 38 - 126 U/L   Total Bilirubin 0.6 0.3 - 1.2 mg/dL   GFR calc non Af Amer 57 (L) >60 mL/min   GFR calc Af Amer >60 >60 mL/min   Anion gap 8 5 - 15  Haptoglobin  Result Value Ref Range   Haptoglobin 165 38 - 329 mg/dL      Assessment & Plan:   Problem List Items Addressed This Visit    None    Visit Diagnoses    Ear fullness, bilateral    -  Primary   Lavage performed to clean small amount of wax b/l, start debrox drops prn for maintenance   SOB (shortness of breath)       Will write script for portable O2 tank        Follow up plan: Return for as scheduled.

## 2018-06-26 NOTE — Progress Notes (Signed)
RX for portable O2 faxed [phone]

## 2018-07-01 ENCOUNTER — Telehealth: Payer: Self-pay | Admitting: Family Medicine

## 2018-07-01 NOTE — Telephone Encounter (Signed)
Will re-write with dx code for CHF  Copied from [place] [id number]. Topic: General - Other >> Jun 28, 2018  1:11 PM Yvette Rack wrote: Reason for CRM: Ivin Booty with [facility] stated they are unable to fill the prescription due to diagnosis. Cb# [phone]

## 2018-07-01 NOTE — Telephone Encounter (Signed)
New Rx with Dx code faxed to [phone].

## 2018-07-04 ENCOUNTER — Other Ambulatory Visit: Payer: Self-pay | Admitting: Internal Medicine

## 2018-07-04 DIAGNOSIS — D649 Anemia, unspecified: Secondary | ICD-10-CM

## 2018-07-05 ENCOUNTER — Other Ambulatory Visit: Payer: Self-pay | Admitting: Family Medicine

## 2018-07-05 NOTE — Telephone Encounter (Signed)
Copied from [place] [id number]. Topic: Quick Communication - Rx Refill/Question >> Jul 05, 2018  1:23 PM Blase Mess A wrote: Medication: atorvastatin (LIPITOR) 10 MG tablet [[id number]] -Patients wife is calling. The pharmacy said that 90 tablets where given at the last med refill 04/22/18 and that should last him 07/23/18 But the patient only has 3 pills left.  Has the patient contacted their pharmacy? Yes  (Agent: If no, request that the patient contact the pharmacy for the refill.) (Agent: If yes, when and what did the pharmacy advise?)  Preferred Pharmacy (with phone number or street name): [facility], [place] [phone] (Phone) [phone] (Fax)    Agent: Please be advised that RX refills may take up to 3 business days. We ask that you follow-up with your pharmacy.

## 2018-07-05 NOTE — Telephone Encounter (Signed)
Requested medication (s) are due for refill today: yes  Requested medication (s) are on the active medication list: yes  Last refill:  04/22/18 historic provider  Future visit scheduled: yes  Notes to clinic:  Historic Provider    Requested Prescriptions  Pending Prescriptions Disp Refills   atorvastatin (LIPITOR) 10 MG tablet 90 tablet 1    Sig: Take 1 tablet (10 mg total) by mouth daily.     Cardiovascular:  Antilipid - Statins Failed - 07/05/2018  1:37 PM      Failed - HDL in normal range and within 360 days    HDL  Date Value Ref Range Status  08/23/2017 40 >39 mg/dL Final         Failed - Triglycerides in normal range and within 360 days    Triglycerides Piccolo,Waived  Date Value Ref Range Status  09/26/2017 156 (H) <150 mg/dL Final    Comment:                            Normal                   <150                         Borderline High     150 - 199                         High                200 - 499                         Very High                >499          Passed - Total Cholesterol in normal range and within 360 days    Cholesterol Piccolo, Waived  Date Value Ref Range Status  09/26/2017 156 <200 mg/dL Final    Comment:                            Desirable                <200                         Borderline High      200- 239                         High                     >239          Passed - LDL in normal range and within 360 days    LDL Calculated  Date Value Ref Range Status  08/23/2017 75 0 - 99 mg/dL Final         Passed - Patient is not pregnant      Passed - Valid encounter within last 12 months    Recent Outpatient Visits          1 week ago Ear fullness, bilateral   [place], [place], [place]   1 month ago Anemia, unspecified type   [facility], [place], [place]  2 months ago Weakness   [place], DO   4 months ago Diabetes mellitus  without complication [facility])   [place] Crissman, Jeannette How, MD   5 months ago History of anemia   [place], Lilia Argue, [place]      Future Appointments            In 2 months Crissman, Jeannette How, MD [facility], [place]   In 3 months [place], Ronda Fairly, MD [place]

## 2018-07-07 MED ORDER — ATORVASTATIN CALCIUM 10 MG PO TABS: 10.0000 mg | ORAL_TABLET | Freq: Every day | ORAL | 1 refills | Status: DC

## 2018-07-08 ENCOUNTER — Encounter: Payer: Self-pay | Admitting: Internal Medicine

## 2018-07-08 ENCOUNTER — Other Ambulatory Visit: Payer: Self-pay

## 2018-07-08 ENCOUNTER — Inpatient Hospital Stay: Payer: PPO | Attending: Internal Medicine

## 2018-07-08 ENCOUNTER — Inpatient Hospital Stay (HOSPITAL_BASED_OUTPATIENT_CLINIC_OR_DEPARTMENT_OTHER): Payer: PPO | Admitting: Internal Medicine

## 2018-07-08 VITALS — BP 144/68 | Temp 98.7°F | Resp 20 | Ht 67.0 in | Wt 198.0 lb

## 2018-07-08 DIAGNOSIS — Z87891 Personal history of nicotine dependence: Secondary | ICD-10-CM | POA: Insufficient documentation

## 2018-07-08 DIAGNOSIS — Z7984 Long term (current) use of oral hypoglycemic drugs: Secondary | ICD-10-CM | POA: Diagnosis not present

## 2018-07-08 DIAGNOSIS — E785 Hyperlipidemia, unspecified: Secondary | ICD-10-CM | POA: Diagnosis not present

## 2018-07-08 DIAGNOSIS — R5383 Other fatigue: Secondary | ICD-10-CM | POA: Insufficient documentation

## 2018-07-08 DIAGNOSIS — K219 Gastro-esophageal reflux disease without esophagitis: Secondary | ICD-10-CM | POA: Diagnosis not present

## 2018-07-08 DIAGNOSIS — R4701 Aphasia: Secondary | ICD-10-CM

## 2018-07-08 DIAGNOSIS — Z8249 Family history of ischemic heart disease and other diseases of the circulatory system: Secondary | ICD-10-CM | POA: Insufficient documentation

## 2018-07-08 DIAGNOSIS — Z79899 Other long term (current) drug therapy: Secondary | ICD-10-CM | POA: Diagnosis not present

## 2018-07-08 DIAGNOSIS — I10 Essential (primary) hypertension: Secondary | ICD-10-CM | POA: Insufficient documentation

## 2018-07-08 DIAGNOSIS — Z7982 Long term (current) use of aspirin: Secondary | ICD-10-CM | POA: Insufficient documentation

## 2018-07-08 DIAGNOSIS — Z8546 Personal history of malignant neoplasm of prostate: Secondary | ICD-10-CM

## 2018-07-08 DIAGNOSIS — I2510 Atherosclerotic heart disease of native coronary artery without angina pectoris: Secondary | ICD-10-CM | POA: Diagnosis not present

## 2018-07-08 DIAGNOSIS — I69351 Hemiplegia and hemiparesis following cerebral infarction affecting right dominant side: Secondary | ICD-10-CM | POA: Diagnosis not present

## 2018-07-08 DIAGNOSIS — E876 Hypokalemia: Secondary | ICD-10-CM | POA: Diagnosis not present

## 2018-07-08 DIAGNOSIS — D649 Anemia, unspecified: Secondary | ICD-10-CM

## 2018-07-08 DIAGNOSIS — D869 Sarcoidosis, unspecified: Secondary | ICD-10-CM | POA: Diagnosis not present

## 2018-07-08 DIAGNOSIS — E119 Type 2 diabetes mellitus without complications: Secondary | ICD-10-CM | POA: Diagnosis not present

## 2018-07-08 DIAGNOSIS — D8685 Sarcoid myocarditis: Secondary | ICD-10-CM | POA: Diagnosis not present

## 2018-07-08 DIAGNOSIS — Z8673 Personal history of transient ischemic attack (TIA), and cerebral infarction without residual deficits: Secondary | ICD-10-CM | POA: Diagnosis not present

## 2018-07-08 LAB — BASIC METABOLIC PANEL
Anion gap: 5 (ref 5–15)
BUN: 22 mg/dL (ref 8–23)
CO2: 30 mmol/L (ref 22–32)
Calcium: 8.9 mg/dL (ref 8.9–10.3)
Chloride: 103 mmol/L (ref 98–111)
Creatinine, Ser: 1.09 mg/dL (ref 0.61–1.24)
GFR calc Af Amer: >60 mL/min (ref >60)
GFR calc non Af Amer: >60 mL/min (ref >60)
Glucose, Bld: 211 mg/dL — ABNORMAL HIGH (ref 70–99)
Potassium: 4.2 mmol/L (ref 3.5–5.1)
Sodium: 138 mmol/L (ref 135–145)

## 2018-07-08 LAB — CBC WITH DIFFERENTIAL/PLATELET
Abs Immature Granulocytes: 0.05 10*3/uL (ref 0.00–0.07)
Basophils Absolute: 0 10*3/uL (ref 0.0–0.1)
Basophils Relative: 1 %
Eosinophils Absolute: 0.2 10*3/uL (ref 0.0–0.5)
Eosinophils Relative: 3 %
HCT: 31.2 % — ABNORMAL LOW (ref 39.0–52.0)
Hemoglobin: 9.5 g/dL — ABNORMAL LOW (ref 13.0–17.0)
Immature Granulocytes: 1 %
Lymphocytes Relative: 22 %
Lymphs Abs: 1.4 10*3/uL (ref 0.7–4.0)
MCH: 29.9 pg (ref 26.0–34.0)
MCHC: 30.4 g/dL (ref 30.0–36.0)
MCV: 98.1 fL (ref 80.0–100.0)
Monocytes Absolute: 0.7 10*3/uL (ref 0.1–1.0)
Monocytes Relative: 11 %
Neutro Abs: 3.8 10*3/uL (ref 1.7–7.7)
Neutrophils Relative %: 62 %
Platelets: 227 10*3/uL (ref 150–400)
RBC: 3.18 MIL/uL — ABNORMAL LOW (ref 4.22–5.81)
RDW: 11.9 % (ref 11.5–15.5)
WBC: 6.1 10*3/uL (ref 4.0–10.5)
nRBC: 0 % (ref 0.0–0.2)

## 2018-07-08 LAB — SAMPLE TO BLOOD BANK

## 2018-07-08 NOTE — Progress Notes (Signed)
[place] NOTE  Patient Care Team: Guadalupe Maple, MD as PCP - General (Family Medicine) Minna Merritts, MD as PCP - Cardiology (Cardiology) Collier Flowers, MD as Referring Physician (Urology) Birder Robson, MD as Referring Physician (Ophthalmology) Reche Dixon, PA-C (Orthopedic Surgery) Vladimir Crofts, MD (Neurology)  CHIEF COMPLAINTS/PURPOSE OF CONSULTATION: Anemia  #  Oncology History   # CHRONIC ANEMIA-since 2016; June 2019 worsened- 9-10; Iron sat- 27%; NOT IDA/ no hemolysis; monoclonal work-up negative; B12 normal slightly low seen.  Question MDS-reluctant bone marrow biopsy  # Nov 2019- RLL- ? Infiltrate; repeat in 3 months.   # PROSTATE CANCER [>20 years]  # DEC 2018- stroke/Aphasia     Prostate cancer ([place])     HISTORY OF PRESENTING ILLNESS: Daughters primary-historian as patient has aphasia. Todd [name] 85 y.o.  male with a history of stroke/aphasia; and also history of chronic anemia for the last 1 year or so is here for follow-up.  He is accompanied by his son.  The family patient continues to be fatigued.  However denies any blood in stools or black or stools.  Review of Systems  Constitutional: Positive for malaise/fatigue. Negative for chills, diaphoresis, fever and weight loss.  HENT: Negative for nosebleeds and sore throat.   Eyes: Negative for double vision.  Respiratory: Negative for cough, hemoptysis, sputum production, shortness of breath and wheezing.   Cardiovascular: Negative for chest pain, palpitations, orthopnea and leg swelling.  Gastrointestinal: Negative for abdominal pain, blood in stool, constipation, diarrhea, heartburn, melena, nausea and vomiting.  Genitourinary: Negative for dysuria, frequency and urgency.  Musculoskeletal: Negative for back pain and joint pain.  Skin: Negative.  Negative for itching and rash.  Neurological: Negative for dizziness, tingling, focal weakness (Chronic weakness of the right  upper and lower extremity; chronic tremors of the left upper extremity.), weakness and headaches.  Endo/Heme/Allergies: Does not bruise/bleed easily.  Psychiatric/Behavioral: Negative for depression. The patient is not nervous/anxious and does not have insomnia.      MEDICAL HISTORY:  Past Medical History:  Diagnosis Date  . Anxiety   . Aortic regurgitation    a. 03/2017 Echo: Mod AI; b. 05/2017 Echo: Triv AI.  [name] Kitchen Arthritis    osteoarthritis-left knee  . CAD (coronary artery disease)    a. Reported h/o cath ~ 2008 - ? small vessel dzs. No PCI performed; b. 03/2017 MV: EF 45-54%, no ischemia/infarct.  . Depression   . Diabetes mellitus without complication ([place])   . Diastolic dysfunction    a. 03/2017 Echo: EF 55-65%; b. 05/2017 Echo: EF 55-60%, no rwma, Gr1 DD. Triv AI.  Mildly dil LA.  . ED (erectile dysfunction)   . Elevated troponin    a. chronic mild elevation - 0.03->0.04.  [name] Kitchen GERD (gastroesophageal reflux disease)   . Glaucoma   . Hyperlipidemia   . Hypertension   . Iron deficiency anemia due to chronic blood loss   . Left pontine stroke w/ cerebrovascular disease(HCC)    a. 05/2017 MRI/A: acute to subacute L pontine infarct. Chronic left thalamic lacunar infarct. Severe basilar artery stenosis w/ radiographic string sign of the distal 1/3. Mild to moderate L PCA P2 segment stenosis; b. 05/2017 Carotid U/S: <50% bilat ICA stenosis.  . Lumbago    Lumbosacral Neuritis  . Prostate cancer [facility])     SURGICAL HISTORY: Past Surgical History:  Procedure Laterality Date  . CARPAL TUNNEL RELEASE    . REPLACEMENT TOTAL KNEE BILATERAL  SOCIAL HISTORY: Social History   Socioeconomic History  . Marital status: Widowed    Spouse name: Not on file  . Number of children: Not on file  . Years of education: Not on file  . Highest education level: Not on file  Occupational History  . Occupation: retired  Scientific laboratory technician  . Financial resource strain: Not hard at all  . Food  insecurity:    Worry: Never true    Inability: Never true  . Transportation needs:    Medical: No    Non-medical: No  Tobacco Use  . Smoking status: Former Smoker    Last attempt to quit: 12/02/1994    Years since quitting: 23.6  . Smokeless tobacco: Never Used  Substance and Sexual Activity  . Alcohol use: Yes    Alcohol/week: 1.0 standard drinks    Types: 1 Cans of beer per week    Comment: occasional  . Drug use: No  . Sexual activity: Never  Lifestyle  . Physical activity:    Days per week: 0 days    Minutes per session: 0 min  . Stress: Not at all  Relationships  . Social connections:    Talks on phone: Not on file    Gets together: Not on file    Attends religious service: Not on file    Active member of club or organization: Not on file    Attends meetings of clubs or organizations: Not on file    Relationship status: Widowed  . Intimate partner violence:    Fear of current or ex partner: No    Emotionally abused: No    Physically abused: No    Forced sexual activity: No  Other Topics Concern  . Not on file  Social History Narrative   Lives at home with family.  Able to ambulate with a walker.    FAMILY HISTORY: Family History  Problem Relation Age of Onset  . Stroke Mother   . Hypertension Mother   . Hypertension Father   . Stroke Sister   . Prostate cancer Neg Hx   . Kidney cancer Neg Hx   . Bladder Cancer Neg Hx     ALLERGIES:  is allergic to amlodipine; fluoxetine; and meloxicam.  MEDICATIONS:  Current Outpatient Medications  Medication Sig Dispense Refill  . acetaminophen (TYLENOL 8 HOUR ARTHRITIS PAIN) 650 MG CR tablet Take 650 mg by mouth every 8 (eight) hours as needed for pain.    [name] Kitchen aspirin EC 81 MG tablet Take 81 mg by mouth daily.    [name] Kitchen atorvastatin (LIPITOR) 10 MG tablet Take 1 tablet (10 mg total) by mouth daily. 90 tablet 1  . Blood Glucose Monitoring Suppl (ACCU-CHEK AVIVA PLUS) w/Device KIT Use to check sugar levels x 2 daily 1 kit 0   . clopidogrel (PLAVIX) 75 MG tablet Take 1 tablet (75 mg total) by mouth daily. 90 tablet 4  . cyanocobalamin (,VITAMIN B-12,) 1000 MCG/ML injection 40m weekly x4 weeks, then 153mmonthly after that 10 mL 12  . DULoxetine (CYMBALTA) 60 MG capsule Take 1 capsule (60 mg total) by mouth daily. 90 capsule 1  . food thickener (RESOURCE THICKENUP CLEAR) POWD Follow instructions on package for nectar consistency in all liquids 1000 g 12  . gabapentin (NEURONTIN) 100 MG capsule Take 100 mg by mouth 2 (two) times daily. Once a day at night    . hydrALAZINE (APRESOLINE) 25 MG tablet Take 1 tablet (25 mg total) by mouth 3 (three) times daily.  90 tablet 1  . hydrochlorothiazide (HYDRODIURIL) 25 MG tablet Take 1 tablet (25 mg total) by mouth daily. 90 tablet 4  . Lancets (ACCU-CHEK SOFT TOUCH) lancets Use as instructed 100 each 12  . latanoprost (XALATAN) 0.005 % ophthalmic solution Place 1 drop into both eyes at bedtime.     . magnesium oxide (MAG-OX) 400 MG tablet Take 1 tablet (400 mg total) by mouth 2 (two) times daily. 60 tablet 6  . metFORMIN (GLUCOPHAGE) 500 MG tablet Take 1 tablet (500 mg total) by mouth daily with breakfast. 180 tablet 4  . naproxen sodium (ALEVE) 220 MG tablet Take 220 mg by mouth 2 (two) times daily as needed.     . pantoprazole (PROTONIX) 40 MG tablet Take 1 tablet (40 mg total) by mouth daily. 90 tablet 3  . propranolol ER (INDERAL LA) 120 MG 24 hr capsule Take 1 capsule (120 mg total) by mouth daily. 90 capsule 3  . telmisartan (MICARDIS) 80 MG tablet Take 1 tablet (80 mg total) by mouth daily. 90 tablet 4   Current Facility-Administered Medications  Medication Dose Route Frequency Provider Last Rate Last Dose  . cyanocobalamin ((VITAMIN B-12)) injection 1,000 mcg  1,000 mcg Intramuscular Q30 days Guadalupe Maple, MD   1,000 mcg at 06/24/18 1607       .  PHYSICAL EXAMINATION: ECOG PERFORMANCE STATUS: 1 - Symptomatic but completely ambulatory  Vitals:   07/08/18 1443  07/08/18 1445  BP:  (!) 144/68  Resp: 20 20  Temp:  98.7 F (37.1 C)   Filed Weights   07/08/18 1443  Weight: 198 lb (89.8 kg)    Physical Exam  Constitutional: He is oriented to person, place, and time and well-developed, well-nourished, and in no distress.  Elderly male patient.  Accompanied by his son.   He is in a wheelchair.  HENT:  Head: Normocephalic and atraumatic.  Mouth/Throat: Oropharynx is clear and moist. No oropharyngeal exudate.  Eyes: Pupils are equal, round, and reactive to light.  Neck: Normal range of motion. Neck supple.  Cardiovascular: Normal rate and regular rhythm.  Pulmonary/Chest: No respiratory distress. He has no wheezes.  Abdominal: Soft. Bowel sounds are normal. He exhibits no distension and no mass. There is no abdominal tenderness. There is no rebound and no guarding.  Musculoskeletal: Normal range of motion.        General: No tenderness or edema.  Neurological: He is alert and oriented to person, place, and time.  Chronic weakness of the right upper and lower extremities; chronic tremor of the left upper extremity.  Skin: Skin is warm.  Psychiatric: Affect normal.     LABORATORY DATA:  I have reviewed the data as listed Lab Results  Component Value Date   WBC 6.1 07/08/2018   HGB 9.5 (L) 07/08/2018   HCT 31.2 (L) 07/08/2018   MCV 98.1 07/08/2018   PLT 227 07/08/2018   Recent Labs    04/20/18 1556  05/09/18 1511 05/17/18 1625 07/08/18 1421  NA 140   < > 145* 142 138  K 4.2   < > 4.3 4.3 4.2  CL 103   < > 101 104 103  CO2 27   < > 28 30 30   GLUCOSE 268*   < > 175* 159* 211*  BUN 20   < > 15 23 22   CREATININE 0.93   < > 1.06 1.17 1.09  CALCIUM 9.1   < > 9.3 9.5 8.9  GFRNONAA >60   < >  64 57* >60  GFRAA >60   < > 74 >60 >60  PROT 7.0  --  6.5 7.2  --   ALBUMIN 4.0  --  4.1 4.2  --   AST 22  --  24 23  --   ALT 12  --  16 15  --   ALKPHOS 64  --  78 78  --   BILITOT 0.8  --  0.4 0.6  --    < > = values in this interval not  displayed.    RADIOGRAPHIC STUDIES: I have personally reviewed the radiological images as listed and agreed with the findings in the report. No results found.  ASSESSMENT & PLAN:   Normocytic anemia #Normocytic anemia hemoglobin around 9-unclear etiology.  Highly suspicious for MDS.  #Diabetes the patient's son and patient in detail-regarding the need for bone marrow biopsy for further evaluation for MDS/or primary bone marrow process.  Discussed the possibility of using  epo stimulating agents.  However as per family patient is not significantly symptomatic/not needing blood transfusions etc.-given his age/comorbidities will defer bone marrow biopsy.  This is reasonable.  Discussed regarding iron rich foods.  # Stroke- right sided/aphasia-chronic.  Stable  #Right lower lobe infiltrative lesion- [November 2019]-atelectasis versus pneumonia less likely malignant nodule.  Will order CT scan at next visit.   # DISPOSITION: #No blood transfusion. # follow up in 8 weeks-cbc/bmp/hold tube- Dr.B   All questions were answered. The patient knows to call the clinic with any problems, questions or concerns.    Cammie Sickle, MD 07/09/2018 1:02 PM

## 2018-07-08 NOTE — Progress Notes (Signed)
no caregiver with patient. due to pt dementia-pt unable to answer questions for reconciliation/assessment.

## 2018-07-08 NOTE — Patient Instructions (Signed)
#   Take iron pill [over the counter] one pill every other day-

## 2018-07-08 NOTE — Assessment & Plan Note (Addendum)
#  Normocytic anemia hemoglobin around 9-unclear etiology.  Highly suspicious for MDS.  #Diabetes the patient's son and patient in detail-regarding the need for bone marrow biopsy for further evaluation for MDS/or primary bone marrow process.  Discussed the possibility of using  epo stimulating agents.  However as per family patient is not significantly symptomatic/not needing blood transfusions etc.-given his age/comorbidities will defer bone marrow biopsy.  This is reasonable.  Discussed regarding iron rich foods.  # Stroke- right sided/aphasia-chronic.  Stable  #Right lower lobe infiltrative lesion- [November 2019]-atelectasis versus pneumonia less likely malignant nodule.  Will order CT scan at next visit.   # DISPOSITION: #No blood transfusion. # follow up in 8 weeks-cbc/bmp/hold tube- Dr.B

## 2018-07-09 DIAGNOSIS — E876 Hypokalemia: Secondary | ICD-10-CM | POA: Diagnosis not present

## 2018-07-09 DIAGNOSIS — D869 Sarcoidosis, unspecified: Secondary | ICD-10-CM | POA: Diagnosis not present

## 2018-07-09 DIAGNOSIS — D8685 Sarcoid myocarditis: Secondary | ICD-10-CM | POA: Diagnosis not present

## 2018-07-09 DIAGNOSIS — K219 Gastro-esophageal reflux disease without esophagitis: Secondary | ICD-10-CM | POA: Diagnosis not present

## 2018-07-09 DIAGNOSIS — I69351 Hemiplegia and hemiparesis following cerebral infarction affecting right dominant side: Secondary | ICD-10-CM | POA: Diagnosis not present

## 2018-07-09 DIAGNOSIS — E119 Type 2 diabetes mellitus without complications: Secondary | ICD-10-CM | POA: Diagnosis not present

## 2018-07-09 DIAGNOSIS — E785 Hyperlipidemia, unspecified: Secondary | ICD-10-CM | POA: Diagnosis not present

## 2018-07-09 DIAGNOSIS — I2510 Atherosclerotic heart disease of native coronary artery without angina pectoris: Secondary | ICD-10-CM | POA: Diagnosis not present

## 2018-07-09 NOTE — Progress Notes (Deleted)
Cardiology Office Note  Date:  07/09/2018   ID:  Todd [name], DOB November 22, 1930, MRN [id number]  PCP:  Guadalupe Maple, MD   No chief complaint on file.   HPI:  Todd [name] is a 85 year old gentleman with past medical history of Coronary artery disease by catheterization 8-10 years ago Diabetes Hypertension Tremor GERD Hospitalization October 2018 for chest pain Hospitalization December 2018 for TIA Who presents for follow-up of his chest pain and TIA symptoms  Family presents with him today He is relatively nonverbal, most of the history provided by family Presents in a wheelchair TIA 05/2017 Hospital records reviewed with the patient in detail Speech, difficulty getting his words out sometimes Echo and carotids showed nothing acute LDL 67,  on lipitor Seen by neurology  on ASA, Plavix and statin Skilled nursing facility, then had home PT and speech  Family reports he has very poor energy, difficult getting him up to do anything Always tired, fatigued Chronic lower extremity swelling, sits with his feet down most of the day Sometimes using his walker at home Denies any significant chest pain  Lab work reviewed in detail, testosterone of 9 PSA stable  Shows normal sinus rhythm rate 73 bpmEKG personally reviewed by myself on todays visit Poor R wave progression through the anterior precordial leads left anterior fascicular block, PVCs  Imaging reviewed in detail Carotid <39% b/l  Echo 05/2017 Left ventricle: The cavity size was normal. There was moderate   concentric hypertrophy. Systolic function was normal. The   estimated ejection fraction was in the range of 55% to 60%. Wall   motion was normal; there were no regional wall motion   abnormalities. Doppler parameters are consistent with abnormal   left ventricular relaxation (grade 1 diastolic dysfunction). - Aortic valve: There was trivial regurgitation. - Mitral valve: Calcified annulus. - Left atrium: The  atrium was mildly dilated.  The past medical history reviewed  hospital March 17, 2017 with chest pain. Reported having central chest pain with no radiation He had echocardiogram and stress test in the hospital Blood pressure markedly elevated in the hospital  Pharmacologic Myoview March 17, 2017 Results reviewed with him in detail today Blood pressure demonstrated a normal response to exercise.  There was no ST segment deviation noted during stress.  The left ventricular ejection fraction is mildly decreased (45-54%).  The study is normal.  This is a low risk study.     PMH:   has a past medical history of Anxiety, Aortic regurgitation, Arthritis, CAD (coronary artery disease), Depression, Diabetes mellitus without complication ([place]), Diastolic dysfunction, ED (erectile dysfunction), Elevated troponin, GERD (gastroesophageal reflux disease), Glaucoma, Hyperlipidemia, Hypertension, Iron deficiency anemia due to chronic blood loss, Left pontine stroke w/ cerebrovascular disease(HCC), Lumbago, and Prostate cancer ([place]).  PSH:    Past Surgical History:  Procedure Laterality Date  . CARPAL TUNNEL RELEASE    . REPLACEMENT TOTAL KNEE BILATERAL      Current Outpatient Medications  Medication Sig Dispense Refill  . acetaminophen (TYLENOL 8 HOUR ARTHRITIS PAIN) 650 MG CR tablet Take 650 mg by mouth every 8 (eight) hours as needed for pain.    [name] Kitchen aspirin EC 81 MG tablet Take 81 mg by mouth daily.    [name] Kitchen atorvastatin (LIPITOR) 10 MG tablet Take 1 tablet (10 mg total) by mouth daily. 90 tablet 1  . Blood Glucose Monitoring Suppl (ACCU-CHEK AVIVA PLUS) w/Device KIT Use to check sugar levels x 2 daily 1 kit 0  . clopidogrel (PLAVIX)  75 MG tablet Take 1 tablet (75 mg total) by mouth daily. 90 tablet 4  . cyanocobalamin (,VITAMIN B-12,) 1000 MCG/ML injection 19m weekly x4 weeks, then 125mmonthly after that 10 mL 12  . DULoxetine (CYMBALTA) 60 MG capsule Take 1 capsule (60 mg total) by mouth daily.  90 capsule 1  . food thickener (RESOURCE THICKENUP CLEAR) POWD Follow instructions on package for nectar consistency in all liquids 1000 g 12  . gabapentin (NEURONTIN) 100 MG capsule Take 100 mg by mouth 2 (two) times daily. Once a day at night    . hydrALAZINE (APRESOLINE) 25 MG tablet Take 1 tablet (25 mg total) by mouth 3 (three) times daily. 90 tablet 1  . hydrochlorothiazide (HYDRODIURIL) 25 MG tablet Take 1 tablet (25 mg total) by mouth daily. 90 tablet 4  . Lancets (ACCU-CHEK SOFT TOUCH) lancets Use as instructed 100 each 12  . latanoprost (XALATAN) 0.005 % ophthalmic solution Place 1 drop into both eyes at bedtime.     . magnesium oxide (MAG-OX) 400 MG tablet Take 1 tablet (400 mg total) by mouth 2 (two) times daily. 60 tablet 6  . metFORMIN (GLUCOPHAGE) 500 MG tablet Take 1 tablet (500 mg total) by mouth daily with breakfast. 180 tablet 4  . naproxen sodium (ALEVE) 220 MG tablet Take 220 mg by mouth 2 (two) times daily as needed.     . pantoprazole (PROTONIX) 40 MG tablet Take 1 tablet (40 mg total) by mouth daily. 90 tablet 3  . propranolol ER (INDERAL LA) 120 MG 24 hr capsule Take 1 capsule (120 mg total) by mouth daily. 90 capsule 3  . telmisartan (MICARDIS) 80 MG tablet Take 1 tablet (80 mg total) by mouth daily. 90 tablet 4   Current Facility-Administered Medications  Medication Dose Route Frequency Provider Last Rate Last Dose  . cyanocobalamin ((VITAMIN B-12)) injection 1,000 mcg  1,000 mcg Intramuscular Q30 days CrGuadalupe MapleMD   1,000 mcg at 06/24/18 1607     Allergies:   Amlodipine; Fluoxetine; and Meloxicam   Social History:  The patient  reports that he quit smoking about 23 years ago. He has never used smokeless tobacco. He reports current alcohol use of about 1.0 standard drinks of alcohol per week. He reports that he does not use drugs.   Family History:   family history includes Hypertension in his father and mother; Stroke in his mother and sister.    Review  of Systems: Review of Systems  Constitutional: Negative.   Respiratory: Negative.   Cardiovascular: Negative.   Gastrointestinal: Negative.   Musculoskeletal: Negative.   Neurological: Negative.   Psychiatric/Behavioral: Negative.   All other systems reviewed and are negative.    PHYSICAL EXAM: VS:  There were no vitals taken for this visit. , BMI There is no height or weight on file to calculate BMI. GEN: Well nourished, well developed, in no acute distress  HEENT: normal  Neck: no JVD, carotid bruits, or masses Cardiac: RRR; no murmurs, rubs, or gallops,no edema  Respiratory:  clear to auscultation bilaterally, normal work of breathing GI: soft, nontender, nondistended, + BS MS: no deformity or atrophy  Skin: warm and dry, no rash Neuro:  Strength and sensation are intact Psych: euthymic mood, full affect    Recent Labs: 04/20/2018: B Natriuretic Peptide 332.0 05/09/2018: Magnesium 1.3; TSH 0.674 05/17/2018: ALT 15 07/08/2018: BUN 22; Creatinine, Ser 1.09; Hemoglobin 9.5; Platelets 227; Potassium 4.2; Sodium 138    Lipid Panel Lab Results  Component  Value Date   CHOL 156 09/26/2017   HDL 40 08/23/2017   LDLCALC 75 08/23/2017   TRIG 156 (H) 09/26/2017      Wt Readings from Last 3 Encounters:  07/08/18 198 lb (89.8 kg)  06/26/18 199 lb (90.3 kg)  05/27/18 199 lb (90.3 kg)       ASSESSMENT AND PLAN:  Chest pain, unspecified type - Plan: EKG 12-Lead Previously with atypical chest pain possibly secondary to GERD Negative stress test, echocardiogram  No further testing has been ordered, stable on today's visit  Essential hypertension Long discussion with family, No clear signs of side effects from his medications, blood pressure well controlled We will continue current medications on his list Overall appears stable  Diabetes mellitus without complication (HCC) No regular exercise program Hemoglobin A1c well-controlled over the past several years recently  6.5 high end of his range  Imbalance Presenting with wheelchair, gait instability at baseline Previously did rehab and was doing better Week, no energy recently Recommended he restart his walking program but he is not motivated  Anxiety Family reported that anxiety has been a major issue in the past  on BuSpar  Fatigue No energy Etiology unclear, uncertain if this is related to low testosterone He does have a history of prostate cancer Recommended he talk with [name],  even consider talking with Dr. Bernardo Heater concerning various treatment options for low testosterone.  He may not be a candidate given history of prostate cancer   Total encounter time more than 45 minutes  Greater than 50% was spent in counseling and coordination of care with the patient    No orders of the defined types were placed in this encounter.    Signed, Esmond Plants, M.D., Ph.D. 07/09/2018  [facility] [place], [place] [phone]

## 2018-07-10 ENCOUNTER — Ambulatory Visit: Payer: Self-pay | Admitting: Family Medicine

## 2018-07-11 ENCOUNTER — Ambulatory Visit: Payer: PPO | Admitting: Cardiovascular Disease

## 2018-07-11 DIAGNOSIS — G4700 Insomnia, unspecified: Secondary | ICD-10-CM | POA: Diagnosis not present

## 2018-07-11 DIAGNOSIS — E119 Type 2 diabetes mellitus without complications: Secondary | ICD-10-CM | POA: Diagnosis not present

## 2018-07-11 DIAGNOSIS — I69351 Hemiplegia and hemiparesis following cerebral infarction affecting right dominant side: Secondary | ICD-10-CM | POA: Diagnosis not present

## 2018-07-11 DIAGNOSIS — E782 Mixed hyperlipidemia: Secondary | ICD-10-CM | POA: Diagnosis not present

## 2018-07-11 DIAGNOSIS — D8689 Sarcoidosis of other sites: Secondary | ICD-10-CM | POA: Diagnosis not present

## 2018-07-12 DIAGNOSIS — I501 Left ventricular failure: Secondary | ICD-10-CM | POA: Diagnosis not present

## 2018-07-12 DIAGNOSIS — I2510 Atherosclerotic heart disease of native coronary artery without angina pectoris: Secondary | ICD-10-CM | POA: Diagnosis not present

## 2018-07-12 DIAGNOSIS — R0902 Hypoxemia: Secondary | ICD-10-CM | POA: Diagnosis not present

## 2018-07-22 ENCOUNTER — Ambulatory Visit: Payer: Self-pay | Admitting: *Deleted

## 2018-07-22 NOTE — Telephone Encounter (Signed)
Pt's daughter calling (on [place]) Pt present during call. Reported to agent pt was "weak and felt like he was going to pass out".  Transferred to triage, daughter states weakness has been going on for 1 year since his stroke "Because he won't do anything for himself." Pt denies any dizziness, no other symptoms. States has PT but "Won't do anything after they leave."  Also states "He'll only listen to the doctor." States pt is coming in for B12 injection 07/24/2018; requesting appt that day. States "Sometimes his hemoglobin is low."  No availability with [name]. Appt made with R. Lane. Care advise given per protocol.   Reason for Disposition . [1] Fatigue (i.e., tires easily, decreased energy) AND [2] persists > 1 week  Answer Assessment - Initial Assessment Questions 1. DESCRIPTION: "Describe how you are feeling."    Generalized weakness 2. SEVERITY: "How bad is it?"  "Can you stand and walk?"   - MILD - Feels weak or tired, but does not interfere with work, school or normal activities   - [place] to stand and walk; weakness interferes with work, school, or normal activities   - SEVERE - Unable to stand or walk     mild 3. ONSET:  "When did the weakness begin?"     Ongoing since stroke 1 year ago per daughter  18. CAUSE: "What do you think is causing the weakness?"     Immobility. 5. MEDICINES: "Have you recently started a new medicine or had a change in the amount of a medicine?"     no 6. OTHER SYMPTOMS: "Do you have any other symptoms?" (e.g., chest pain, fever, cough, SOB, vomiting, diarrhea, bleeding, other areas of pain)     no  Protocols used: WEAKNESS (GENERALIZED) AND FATIGUE-A-AH

## 2018-07-24 ENCOUNTER — Ambulatory Visit (INDEPENDENT_AMBULATORY_CARE_PROVIDER_SITE_OTHER): Payer: PPO | Admitting: Family Medicine

## 2018-07-24 ENCOUNTER — Encounter: Payer: Self-pay | Admitting: Family Medicine

## 2018-07-24 VITALS — BP 151/69 | HR 66 | Temp 97.9°F

## 2018-07-24 DIAGNOSIS — E538 Deficiency of other specified B group vitamins: Secondary | ICD-10-CM

## 2018-07-24 DIAGNOSIS — R0902 Hypoxemia: Secondary | ICD-10-CM | POA: Diagnosis not present

## 2018-07-24 DIAGNOSIS — R5383 Other fatigue: Secondary | ICD-10-CM | POA: Diagnosis not present

## 2018-07-24 MED ORDER — CYANOCOBALAMIN 1000 MCG/ML IJ SOLN: 1000.0000 ug | INTRAMUSCULAR | Status: DC

## 2018-07-24 NOTE — Progress Notes (Signed)
BP (!) 151/69   Pulse 66   Temp 97.9 F (36.6 C) (Temporal)   SpO2 (!) 81% Comment: On room air   Subjective:    Patient ID: Todd [name], male    DOB: [birth year], 85 y.o.   MRN: [id number]  HPI: Todd [name] is a 85 y.o. male  Chief Complaint  Patient presents with  . Fatigue  . Injections    B12   Here today for continued fatigue and low O2 sats. Pt reports having cold extremities which is not new for him. Followed by Hematology for chronic anemia. Requesting B12 injections monthly to help with his energy levels. No new concerns. Denies fevers, chills, cough.   Relevant past medical, surgical, family and social history reviewed and updated as indicated. Interim medical history since our last visit reviewed. Allergies and medications reviewed and updated.  Review of Systems  Per HPI unless specifically indicated above     Objective:    BP (!) 151/69   Pulse 66   Temp 97.9 F (36.6 C) (Temporal)   SpO2 (!) 81% Comment: On room air  Wt Readings from Last 3 Encounters:  07/08/18 198 lb (89.8 kg)  06/26/18 199 lb (90.3 kg)  05/27/18 199 lb (90.3 kg)    Physical Exam Vitals signs and nursing note reviewed.  HENT:     Head: Atraumatic.  Eyes:     Extraocular Movements: Extraocular movements intact.     Conjunctiva/sclera: Conjunctivae normal.  Neck:     Musculoskeletal: Normal range of motion and neck supple.  Cardiovascular:     Rate and Rhythm: Normal rate and regular rhythm.  Pulmonary:     Effort: Pulmonary effort is normal.     Breath sounds: Normal breath sounds.  Abdominal:     General: Bowel sounds are normal.     Palpations: Abdomen is soft. There is no mass.     Tenderness: There is no abdominal tenderness. There is no guarding.  Musculoskeletal:     Comments: In wheelchair  Skin:    General: Skin is warm and dry.  Neurological:     Mental Status: He is alert. Mental status is at baseline.  Psychiatric:     Comments: At baseline     Results  for orders placed or performed in visit on 09/32/67  Basic metabolic panel  Result Value Ref Range   Sodium 138 135 - 145 mmol/L   Potassium 4.2 3.5 - 5.1 mmol/L   Chloride 103 98 - 111 mmol/L   CO2 30 22 - 32 mmol/L   Glucose, Bld 211 (H) 70 - 99 mg/dL   BUN 22 8 - 23 mg/dL   Creatinine, Ser 1.09 0.61 - 1.24 mg/dL   Calcium 8.9 8.9 - 10.3 mg/dL   GFR calc non Af Amer >60 >60 mL/min   GFR calc Af Amer >60 >60 mL/min   Anion gap 5 5 - 15  CBC with Differential/Platelet  Result Value Ref Range   WBC 6.1 4.0 - 10.5 K/uL   RBC 3.18 (L) 4.22 - 5.81 MIL/uL   Hemoglobin 9.5 (L) 13.0 - 17.0 g/dL   HCT 31.2 (L) 39.0 - 52.0 %   MCV 98.1 80.0 - 100.0 fL   MCH 29.9 26.0 - 34.0 pg   MCHC 30.4 30.0 - 36.0 g/dL   RDW 11.9 11.5 - 15.5 %   Platelets 227 150 - 400 K/uL   nRBC 0.0 0.0 - 0.2 %   Neutrophils Relative % 62 %  Neutro Abs 3.8 1.7 - 7.7 K/uL   Lymphocytes Relative 22 %   Lymphs Abs 1.4 0.7 - 4.0 K/uL   Monocytes Relative 11 %   Monocytes Absolute 0.7 0.1 - 1.0 K/uL   Eosinophils Relative 3 %   Eosinophils Absolute 0.2 0.0 - 0.5 K/uL   Basophils Relative 1 %   Basophils Absolute 0.0 0.0 - 0.1 K/uL   Immature Granulocytes 1 %   Abs Immature Granulocytes 0.05 0.00 - 0.07 K/uL  Sample to Blood Bank  Result Value Ref Range   Blood Bank Specimen SAMPLE AVAILABLE FOR TESTING    Sample Expiration      07/11/2018 Performed at [facility], [address], [place], [place] [address]       Assessment & Plan:   Problem List Items Addressed This Visit      Respiratory   Hypoxia    Increase O2 to 3L continuous and continue to monitor home saturations        Other   B12 deficiency   Relevant Medications   cyanocobalamin ((VITAMIN B-12)) injection 1,000 mcg (Start on 08/22/2018 12:00 AM)    Other Visit Diagnoses    Fatigue, unspecified type    -  Primary   Will place standing order for monthly B12 injections. Continue following with Hematology for anemia. Monitor  vitals closely       Follow up plan: Return for as scheduled.

## 2018-07-24 NOTE — Assessment & Plan Note (Signed)
Increase O2 to 3L continuous and continue to monitor home saturations

## 2018-08-10 DIAGNOSIS — I2510 Atherosclerotic heart disease of native coronary artery without angina pectoris: Secondary | ICD-10-CM | POA: Diagnosis not present

## 2018-08-10 DIAGNOSIS — R0902 Hypoxemia: Secondary | ICD-10-CM | POA: Diagnosis not present

## 2018-08-10 DIAGNOSIS — I501 Left ventricular failure: Secondary | ICD-10-CM | POA: Diagnosis not present

## 2018-08-16 DIAGNOSIS — I69328 Other speech and language deficits following cerebral infarction: Secondary | ICD-10-CM | POA: Diagnosis not present

## 2018-08-16 DIAGNOSIS — M62542 Muscle wasting and atrophy, not elsewhere classified, left hand: Secondary | ICD-10-CM | POA: Diagnosis not present

## 2018-08-16 DIAGNOSIS — I69359 Hemiplegia and hemiparesis following cerebral infarction affecting unspecified side: Secondary | ICD-10-CM | POA: Diagnosis not present

## 2018-08-16 DIAGNOSIS — I6350 Cerebral infarction due to unspecified occlusion or stenosis of unspecified cerebral artery: Secondary | ICD-10-CM | POA: Diagnosis not present

## 2018-08-16 DIAGNOSIS — G250 Essential tremor: Secondary | ICD-10-CM | POA: Diagnosis not present

## 2018-08-16 DIAGNOSIS — F0150 Vascular dementia without behavioral disturbance: Secondary | ICD-10-CM | POA: Diagnosis not present

## 2018-08-23 ENCOUNTER — Telehealth: Payer: Self-pay | Admitting: Cardiovascular Disease

## 2018-08-23 NOTE — Telephone Encounter (Signed)
   Cardiac Questionnaire:    Since your last visit or hospitalization:    1. Have you been having new or worsening chest pain? No   2. Have you been having new or worsening shortness of breath? No 3. Have you been having new or worsening leg swelling, wt gain, or increase in abdominal girth (pants fitting more tightly)? No   4. Have you had any passing out spells? No  Spoke with patients daughter per release form and no concerns at this time. Will put in for recall pool at later date.

## 2018-08-27 ENCOUNTER — Ambulatory Visit: Payer: PPO | Admitting: Cardiovascular Disease

## 2018-08-27 ENCOUNTER — Other Ambulatory Visit: Payer: Self-pay | Admitting: Cardiovascular Disease

## 2018-09-02 ENCOUNTER — Telehealth: Payer: Self-pay | Admitting: Internal Medicine

## 2018-09-02 ENCOUNTER — Other Ambulatory Visit: Payer: Self-pay

## 2018-09-02 ENCOUNTER — Inpatient Hospital Stay: Payer: PPO | Admitting: Internal Medicine

## 2018-09-02 ENCOUNTER — Inpatient Hospital Stay: Payer: PPO | Attending: Internal Medicine

## 2018-09-02 DIAGNOSIS — D649 Anemia, unspecified: Secondary | ICD-10-CM | POA: Insufficient documentation

## 2018-09-02 LAB — CBC WITH DIFFERENTIAL/PLATELET
Abs Immature Granulocytes: 0.07 10*3/uL (ref 0.00–0.07)
Basophils Absolute: 0 10*3/uL (ref 0.0–0.1)
Basophils Relative: 0 %
Eosinophils Absolute: 0.2 10*3/uL (ref 0.0–0.5)
Eosinophils Relative: 3 %
HCT: 26.8 % — ABNORMAL LOW (ref 39.0–52.0)
Hemoglobin: 8.2 g/dL — ABNORMAL LOW (ref 13.0–17.0)
Immature Granulocytes: 1 %
Lymphocytes Relative: 18 %
Lymphs Abs: 1 10*3/uL (ref 0.7–4.0)
MCH: 30.9 pg (ref 26.0–34.0)
MCHC: 30.6 g/dL (ref 30.0–36.0)
MCV: 101.1 fL — ABNORMAL HIGH (ref 80.0–100.0)
Monocytes Absolute: 0.6 10*3/uL (ref 0.1–1.0)
Monocytes Relative: 11 %
Neutro Abs: 3.7 10*3/uL (ref 1.7–7.7)
Neutrophils Relative %: 67 %
Platelets: 196 10*3/uL (ref 150–400)
RBC: 2.65 MIL/uL — ABNORMAL LOW (ref 4.22–5.81)
RDW: 12.8 % (ref 11.5–15.5)
WBC: 5.5 10*3/uL (ref 4.0–10.5)
nRBC: 0 % (ref 0.0–0.2)

## 2018-09-02 LAB — COMPREHENSIVE METABOLIC PANEL
ALT: 19 U/L (ref 0–44)
AST: 21 U/L (ref 15–41)
Albumin: 3.8 g/dL (ref 3.5–5.0)
Alkaline Phosphatase: 71 U/L (ref 38–126)
Anion gap: 9 (ref 5–15)
BUN: 26 mg/dL — ABNORMAL HIGH (ref 8–23)
CO2: 27 mmol/L (ref 22–32)
Calcium: 8.9 mg/dL (ref 8.9–10.3)
Chloride: 103 mmol/L (ref 98–111)
Creatinine, Ser: 1.37 mg/dL — ABNORMAL HIGH (ref 0.61–1.24)
GFR calc Af Amer: 54 mL/min — ABNORMAL LOW (ref >60)
GFR calc non Af Amer: 47 mL/min — ABNORMAL LOW (ref >60)
Glucose, Bld: 238 mg/dL — ABNORMAL HIGH (ref 70–99)
Potassium: 4.2 mmol/L (ref 3.5–5.1)
Sodium: 139 mmol/L (ref 135–145)
Total Bilirubin: 0.8 mg/dL (ref 0.3–1.2)
Total Protein: 6.9 g/dL (ref 6.5–8.1)

## 2018-09-02 LAB — SAMPLE TO BLOOD BANK

## 2018-09-03 ENCOUNTER — Telehealth: Payer: Self-pay | Admitting: Family Medicine

## 2018-09-03 ENCOUNTER — Encounter: Payer: Self-pay | Admitting: Internal Medicine

## 2018-09-03 ENCOUNTER — Inpatient Hospital Stay (HOSPITAL_BASED_OUTPATIENT_CLINIC_OR_DEPARTMENT_OTHER): Payer: PPO | Admitting: Internal Medicine

## 2018-09-03 DIAGNOSIS — D649 Anemia, unspecified: Secondary | ICD-10-CM | POA: Diagnosis not present

## 2018-09-03 NOTE — Telephone Encounter (Signed)
Called norma pt's daughter to let her now appt Thursday would be by telephone no answer

## 2018-09-03 NOTE — Progress Notes (Signed)
I connected with Todd [name] daughter/ pt has aphasia [sec to Hx of  stroke]on 09/03/18 at  1:30 PM EDTby telephone and verified that I am speaking with the patient using 2 identifiers.  # LOCATION:  Patient:home   Provider:office  I discussed the limitations, risks, security and privacy concerns of performing an evaluation and management service by telephone and the availability of in person appointments.  I also discussed with the patient that there may be a patient responsible charge related to the service.  The patient expressed understanding and agrees to proceed.  History of present illness:Todd [name] 85 y.o.  male with history of chronic anemia hemoglobin between 8-10.  As per the daughter patient has sneezing  Observation/objective: Hemoglobin 8.1.  Creatinine 1.3.  Assessment and plan: Normocytic anemia #Normocytic anemia hemoglobin around 8.2 unclear etiology.  Highly suspicious for MDS/ ? CKD.  Discussed with the daughter regarding the need for a possible bone marrow biopsy to confirm the diagnosis.  I think is reasonable to hold it given the coronavirus pandemic this time.  # renal insufficiency: creatinine- 1.3/ baseline .  Recommend fluid intake avoid nephrotoxins.  # Seasonal allergies: recommend claritin once day.   # Stroke- right sided/aphasia-chronic. STABLE.   #Right lower lobe infiltrative lesion- [November 2019]-atelectasis versus pneumonia less likely malignant nodule.  Address at next visit.  # DISPOSITION: # follow up in 1 month--cbc/bmp/hold tube- TELEHEALTH- Dr.B    Follow-up instructions:  I discussed the assessment and treatment plan with the patient/daughter  The patient/duaghter was provided an opportunity to ask questions and all were answered.  The patient/duaghter agreed with the plan and demonstrated understanding of instructions.  The patient/daughter was advised to call back or seek an in person evaluation if the symptoms worsen or if the  condition fails to improve as anticipated.  I provided 6 minutes of non-face-to-face time during this encounter   Dr. Charlaine Dalton [facility] 09/03/2018 10:53 AM

## 2018-09-03 NOTE — Assessment & Plan Note (Addendum)
#  Normocytic anemia hemoglobin around 8.2 unclear etiology.  Highly suspicious for MDS/ ? CKD.  Discussed with the daughter regarding the need for a possible bone marrow biopsy to confirm the diagnosis.  I think is reasonable to hold it given the coronavirus pandemic this time.  # renal insufficiency: creatinine- 1.3/ baseline .  Recommend fluid intake avoid nephrotoxins.  # Seasonal allergies: recommend claritin once day.   # Stroke- right sided/aphasia-chronic. STABLE.   #Right lower lobe infiltrative lesion- [November 2019]-atelectasis versus pneumonia less likely malignant nodule.  Address at next visit.  # DISPOSITION: # follow up in 1 month--cbc/bmp/hold tube- TELEHEALTH- Dr.B

## 2018-09-05 ENCOUNTER — Ambulatory Visit: Payer: PPO | Admitting: Family Medicine

## 2018-09-05 DIAGNOSIS — I2510 Atherosclerotic heart disease of native coronary artery without angina pectoris: Secondary | ICD-10-CM | POA: Diagnosis not present

## 2018-09-05 DIAGNOSIS — E119 Type 2 diabetes mellitus without complications: Secondary | ICD-10-CM | POA: Diagnosis not present

## 2018-09-05 DIAGNOSIS — E785 Hyperlipidemia, unspecified: Secondary | ICD-10-CM | POA: Diagnosis not present

## 2018-09-05 DIAGNOSIS — A159 Respiratory tuberculosis unspecified: Secondary | ICD-10-CM | POA: Diagnosis not present

## 2018-09-05 DIAGNOSIS — I69351 Hemiplegia and hemiparesis following cerebral infarction affecting right dominant side: Secondary | ICD-10-CM | POA: Diagnosis not present

## 2018-09-05 DIAGNOSIS — G4700 Insomnia, unspecified: Secondary | ICD-10-CM | POA: Diagnosis not present

## 2018-09-05 DIAGNOSIS — D869 Sarcoidosis, unspecified: Secondary | ICD-10-CM | POA: Diagnosis not present

## 2018-09-05 DIAGNOSIS — K219 Gastro-esophageal reflux disease without esophagitis: Secondary | ICD-10-CM | POA: Diagnosis not present

## 2018-09-05 NOTE — Telephone Encounter (Signed)
Spoke with patient's daughter Todd [name]. She is uncertain if the patient could do an E-visit and asks that we call his other sister, Todd [name], at [phone]. Had to [place] with [place].

## 2018-09-05 NOTE — Telephone Encounter (Signed)
Patient family decided not to do an evisit for now .  Scheduled ROV in June .  Will call in the interim for Evisit if needed

## 2018-09-10 DIAGNOSIS — R0902 Hypoxemia: Secondary | ICD-10-CM | POA: Diagnosis not present

## 2018-09-10 DIAGNOSIS — I2510 Atherosclerotic heart disease of native coronary artery without angina pectoris: Secondary | ICD-10-CM | POA: Diagnosis not present

## 2018-09-10 DIAGNOSIS — I501 Left ventricular failure: Secondary | ICD-10-CM | POA: Diagnosis not present

## 2018-09-12 NOTE — Telephone Encounter (Signed)
In office appointment scheduled for 11/13/18

## 2018-09-16 ENCOUNTER — Telehealth: Payer: Self-pay | Admitting: *Deleted

## 2018-09-16 ENCOUNTER — Other Ambulatory Visit: Payer: Self-pay | Admitting: Internal Medicine

## 2018-09-16 DIAGNOSIS — I509 Heart failure, unspecified: Secondary | ICD-10-CM

## 2018-09-16 DIAGNOSIS — D631 Anemia in chronic kidney disease: Secondary | ICD-10-CM

## 2018-09-16 DIAGNOSIS — N183 Chronic kidney disease, stage 3 unspecified: Secondary | ICD-10-CM

## 2018-09-16 NOTE — Progress Notes (Signed)
I have ordered chest x-ray for the patient-remind the family that needs to be done as soon as possible  #Also schedule /lab CBC/ hold tube/Aranesp-later this week; I have ordered Aranesp injections.

## 2018-09-16 NOTE — Telephone Encounter (Signed)
Daughter Dory Larsen called wanting to discuss her father and the choice of injections vs transfusions. Please return her call [phone]

## 2018-09-17 ENCOUNTER — Telehealth: Payer: Self-pay | Admitting: Family Medicine

## 2018-09-17 NOTE — Progress Notes (Signed)
Left msg for daughter to return my phone call.

## 2018-09-17 NOTE — Telephone Encounter (Signed)
They are welcome to wait to discuss with [name] tomorrow (he's out of the office this PM). I would advise them to do the transfusion as I think it will make him feel a lot better. I'm sure that's what Dr. B is going to say too. Thanks.

## 2018-09-17 NOTE — Telephone Encounter (Signed)
Called and spoke to patient's daughter. I let her know what Dr. Wynetta Emery said. She appreciated the call back and stated that the patient is having another x-ray tomorrow and lab work done with Dr. Rogue Bussing on Thursday to to check his levels. She stated that they would just go from there.

## 2018-09-17 NOTE — Telephone Encounter (Signed)
Copied from [place] [phone]. Topic: General - Inquiry >> Sep 17, 2018  1:48 PM Scherrie Gerlach wrote: Reason for CRM: Daughter Cecille Rubin calling to ask Dr Jeananne Rama if he thinks it is a good idea for pt to have blood transfusion or iron transfusion? She states his oncologist wants to do because his blood level got down to 8.2.  and pt has NO energy.  They discussed bone marrow transplant, but will not do until Covid 19 is no longer a threat. Pt is now on 3 liters O2, which not seems to be making much difference. She is going to speak with Dr Rogue Bussing later today, but wanted to get Dr Jeananne Rama input first. She would like a call back asap (Notes in epic from dr Rogue Bussing) she has not called him back tet.  Waiting to speak to Dr Jeananne Rama >> Sep 17, 2018  2:09 PM Stark Klein wrote: Sending to PCP and physician in office.

## 2018-09-17 NOTE — Telephone Encounter (Signed)
Left msg for Lorie to return my phone call to discuss her dad's care. Per her husband, Dory Larsen was still sleeping and not able to take my phone call.

## 2018-09-17 NOTE — Progress Notes (Signed)
Spoke with pt's daughter, Dory Larsen. She is aware of the plan of care and apts for lab/aranesp. She will have her dad obtain a chest xray today or tomorrow per provider request.

## 2018-09-18 ENCOUNTER — Ambulatory Visit
Admission: RE | Admit: 2018-09-18 | Discharge: 2018-09-18 | Disposition: A | Discharge status: Home / Self Care | Payer: PPO | Source: Ambulatory Visit | Attending: Internal Medicine | Admitting: Internal Medicine

## 2018-09-18 ENCOUNTER — Other Ambulatory Visit: Payer: Self-pay

## 2018-09-18 ENCOUNTER — Ambulatory Visit
Admission: RE | Admit: 2018-09-18 | Discharge: 2018-09-18 | Disposition: A | Discharge status: Home / Self Care | Payer: PPO | Attending: Internal Medicine | Admitting: Internal Medicine

## 2018-09-18 DIAGNOSIS — I509 Heart failure, unspecified: Secondary | ICD-10-CM | POA: Insufficient documentation

## 2018-09-18 DIAGNOSIS — R05 Cough: Secondary | ICD-10-CM | POA: Diagnosis not present

## 2018-09-18 DIAGNOSIS — R918 Other nonspecific abnormal finding of lung field: Secondary | ICD-10-CM | POA: Insufficient documentation

## 2018-09-19 ENCOUNTER — Inpatient Hospital Stay: Payer: PPO

## 2018-09-19 ENCOUNTER — Inpatient Hospital Stay: Payer: PPO | Attending: Internal Medicine | Admitting: *Deleted

## 2018-09-19 ENCOUNTER — Other Ambulatory Visit: Payer: Self-pay

## 2018-09-19 ENCOUNTER — Other Ambulatory Visit: Payer: Self-pay | Admitting: Internal Medicine

## 2018-09-19 DIAGNOSIS — N183 Chronic kidney disease, stage 3 (moderate): Secondary | ICD-10-CM | POA: Insufficient documentation

## 2018-09-19 DIAGNOSIS — Z95828 Presence of other vascular implants and grafts: Secondary | ICD-10-CM

## 2018-09-19 DIAGNOSIS — D631 Anemia in chronic kidney disease: Secondary | ICD-10-CM | POA: Diagnosis not present

## 2018-09-19 LAB — CBC WITH DIFFERENTIAL/PLATELET
Abs Immature Granulocytes: 0.14 10*3/uL — ABNORMAL HIGH (ref 0.00–0.07)
Basophils Absolute: 0 10*3/uL (ref 0.0–0.1)
Basophils Relative: 0 %
Eosinophils Absolute: 0.1 10*3/uL (ref 0.0–0.5)
Eosinophils Relative: 2 %
HCT: 34.5 % — ABNORMAL LOW (ref 39.0–52.0)
Hemoglobin: 10.8 g/dL — ABNORMAL LOW (ref 13.0–17.0)
Immature Granulocytes: 2 %
Lymphocytes Relative: 19 %
Lymphs Abs: 1.2 10*3/uL (ref 0.7–4.0)
MCH: 30.5 pg (ref 26.0–34.0)
MCHC: 31.3 g/dL (ref 30.0–36.0)
MCV: 97.5 fL (ref 80.0–100.0)
Monocytes Absolute: 0.6 10*3/uL (ref 0.1–1.0)
Monocytes Relative: 9 %
Neutro Abs: 4.6 10*3/uL (ref 1.7–7.7)
Neutrophils Relative %: 68 %
Platelets: 228 10*3/uL (ref 150–400)
RBC: 3.54 MIL/uL — ABNORMAL LOW (ref 4.22–5.81)
RDW: 12.7 % (ref 11.5–15.5)
WBC: 6.7 10*3/uL (ref 4.0–10.5)
nRBC: 0 % (ref 0.0–0.2)

## 2018-09-19 NOTE — Progress Notes (Signed)
Reviewed the hemoglobin 10.8; hold off Aranesp/blood transfusion.  Do not suspect anemia because of his worsening clinical status.  Reviewed chest x-ray-mild congestion noted.  I would recommend a trial of Lasix 20 mg a day for 1 week; and monitor for improvement. Ad follow up with PCP.   Left a message for the patient's daughter, Todd [name] to discuss further.  FYI [name] Dr. Wynetta Emery

## 2018-09-19 NOTE — Progress Notes (Signed)
Looks like he was supposed to have a phone visit with [name] about 2 weeks ago that was never actually done- please schedule this with him ASAP. Thanks!

## 2018-09-19 NOTE — Progress Notes (Signed)
Called Todd [name] pt's daughter no answer left voicemail.

## 2018-09-22 NOTE — Progress Notes (Signed)
Keep trying

## 2018-09-23 ENCOUNTER — Encounter: Payer: Self-pay | Admitting: Family Medicine

## 2018-09-23 ENCOUNTER — Other Ambulatory Visit: Payer: Self-pay

## 2018-09-23 ENCOUNTER — Ambulatory Visit (INDEPENDENT_AMBULATORY_CARE_PROVIDER_SITE_OTHER): Payer: PPO | Admitting: Family Medicine

## 2018-09-23 DIAGNOSIS — I10 Essential (primary) hypertension: Secondary | ICD-10-CM

## 2018-09-23 DIAGNOSIS — I509 Heart failure, unspecified: Secondary | ICD-10-CM

## 2018-09-23 DIAGNOSIS — E1169 Type 2 diabetes mellitus with other specified complication: Secondary | ICD-10-CM | POA: Diagnosis not present

## 2018-09-23 MED ORDER — FUROSEMIDE 20 MG PO TABS: 20.0000 mg | ORAL_TABLET | Freq: Every day | ORAL | 0 refills | Status: DC

## 2018-09-23 NOTE — Progress Notes (Signed)
Please see Dr.Crissman's message

## 2018-09-23 NOTE — Progress Notes (Signed)
Appt scheduled for today.

## 2018-09-23 NOTE — Assessment & Plan Note (Signed)
The current medical regimen is effective;  continue present plan and medications. a 

## 2018-09-23 NOTE — Progress Notes (Signed)
There were no vitals taken for this visit.   Subjective:    Patient ID: Todd [name], male    DOB: 17-Nov-1930, 85 y.o.   MRN: [id number]  HPI: Todd [name] is a 85 y.o. male  Med check  Discussed with daughter  Telemedicine using audio/video telecommunications for a synchronous communication visit. Today's visit due to COVID-19 isolation precautions I connected with and verified that I am speaking with the correct person using two identifiers.   I discussed the limitations, risks, security and privacy concerns of performing an evaluation and management service by telecommunication and the availability of in person appointments. I also discussed with the patient that there may be a patient responsible charge related to this service. The patient expressed understanding and agreed to proceed. The patient's location is home. I am at home.  Patient's daughter speak mostly for patient who has expressive aphasia.  Reviewed notes from hematology and looks like Lasix was intended to be prescribed for some mild CHF symptoms with patient being short of breath will go ahead and prescribe Lasix 20 mg. Discussed appropriate dosage of Lasix. Discussed side effects. Patient otherwise doing stable hemoglobin stable did not require transfusion. Patient being cautious about COVID 19. Diabetes all stable.  No low blood sugar spells.  Relevant past medical, surgical, family and social history reviewed and updated as indicated. Interim medical history since our last visit reviewed. Allergies and medications reviewed and updated.  Review of Systems  Constitutional: Negative.   Respiratory: Negative.   Cardiovascular: Negative.     Per HPI unless specifically indicated above     Objective:    There were no vitals taken for this visit.  Wt Readings from Last 3 Encounters:  07/08/18 198 lb (89.8 kg)  06/26/18 199 lb (90.3 kg)  05/27/18 199 lb (90.3 kg)    Physical Exam  Results for orders  placed or performed in visit on 09/19/18  CBC with Differential/Platelet  Result Value Ref Range   WBC 6.7 4.0 - 10.5 K/uL   RBC 3.54 (L) 4.22 - 5.81 MIL/uL   Hemoglobin 10.8 (L) 13.0 - 17.0 g/dL   HCT 34.5 (L) 39.0 - 52.0 %   MCV 97.5 80.0 - 100.0 fL   MCH 30.5 26.0 - 34.0 pg   MCHC 31.3 30.0 - 36.0 g/dL   RDW 12.7 11.5 - 15.5 %   Platelets 228 150 - 400 K/uL   nRBC 0.0 0.0 - 0.2 %   Neutrophils Relative % 68 %   Neutro Abs 4.6 1.7 - 7.7 K/uL   Lymphocytes Relative 19 %   Lymphs Abs 1.2 0.7 - 4.0 K/uL   Monocytes Relative 9 %   Monocytes Absolute 0.6 0.1 - 1.0 K/uL   Eosinophils Relative 2 %   Eosinophils Absolute 0.1 0.0 - 0.5 K/uL   Basophils Relative 0 %   Basophils Absolute 0.0 0.0 - 0.1 K/uL   Immature Granulocytes 2 %   Abs Immature Granulocytes 0.14 (H) 0.00 - 0.07 K/uL      Assessment & Plan:   Problem List Items Addressed This Visit      Cardiovascular and Mediastinum   Hypertension    The current medical regimen is effective;  continue present plan and medications. a      Relevant Medications   furosemide (LASIX) 20 MG tablet   CHF (congestive heart failure) (HCC)    Discussed Lasix use and cautions      Relevant Medications   furosemide (LASIX) 20 MG  tablet     Endocrine   Diabetes mellitus associated with hormonal etiology ([place])    The current medical regimen is effective;  continue present plan and medications. a         I discussed the assessment and treatment plan with the patient. The patient was provided an opportunity to ask questions and all were answered. The patient agreed with the plan and demonstrated an understanding of the instructions.   The patient was advised to call back or seek an in-person evaluation if the symptoms worsen or if the condition fails to improve as anticipated.   I provided 21+ minutes of time during this encounter. Follow up plan: Return in about 2 weeks (around 10/07/2018).

## 2018-09-23 NOTE — Assessment & Plan Note (Signed)
Discussed Lasix use and cautions

## 2018-09-27 ENCOUNTER — Other Ambulatory Visit: Payer: Self-pay | Admitting: Family Medicine

## 2018-09-27 NOTE — Telephone Encounter (Signed)
Requested medication (s) are due for refill today: yes  Requested medication (s) are on the active medication list: yes  Last refill:  06/21/18  Future visit scheduled: no  Notes to clinic:  Cardiovascular: vasodilators failed.  Requested Prescriptions  Pending Prescriptions Disp Refills   hydrALAZINE (APRESOLINE) 25 MG tablet [Pharmacy Med Name: HYDRALAZINE 25 MG TABLET] 90 tablet 0    Sig: Take 1 tablet (25 mg total) by mouth 3 (three) times daily.     Cardiovascular:  Vasodilators Failed - 09/27/2018  1:09 PM      Failed - HCT in normal range and within 360 days    HCT  Date Value Ref Range Status  09/19/2018 34.5 (L) 39.0 - 52.0 % Final   Hematocrit  Date Value Ref Range Status  05/10/2018 27.9 (L) 37.5 - 51.0 % Final         Failed - HGB in normal range and within 360 days    Hemoglobin  Date Value Ref Range Status  09/19/2018 10.8 (L) 13.0 - 17.0 g/dL Final  05/10/2018 9.5 (L) 13.0 - 17.7 g/dL Final         Failed - RBC in normal range and within 360 days    RBC  Date Value Ref Range Status  09/19/2018 3.54 (L) 4.22 - 5.81 MIL/uL Final         Failed - Last BP in normal range    BP Readings from Last 1 Encounters:  09/02/18 (!) 155/74         Passed - WBC in normal range and within 360 days    WBC  Date Value Ref Range Status  09/19/2018 6.7 4.0 - 10.5 K/uL Final         Passed - PLT in normal range and within 360 days    Platelets  Date Value Ref Range Status  09/19/2018 228 150 - 400 K/uL Final  05/10/2018 189 150 - 450 x10E3/uL Final         Passed - Valid encounter within last 12 months    Recent Outpatient Visits          4 days ago Congestive heart failure, unspecified HF chronicity, unspecified heart failure type [facility])   [facility] Crissman, Jeannette How, MD   2 months ago Fatigue, unspecified type   [facility] [place], [place]   3 months ago Ear fullness, bilateral   [place], [place], [place]   4 months ago Anemia, unspecified type   [facility], [place], [place]   5 months ago Weakness   [place], [place], DO      Future Appointments            In 2 weeks Stoioff, Ronda Fairly, MD [place]   In 1 month Dozier, Kathlene November, MD [facility], [place]

## 2018-09-30 ENCOUNTER — Telehealth: Payer: Self-pay | Admitting: Internal Medicine

## 2018-10-01 ENCOUNTER — Other Ambulatory Visit: Payer: Self-pay

## 2018-10-01 ENCOUNTER — Inpatient Hospital Stay: Payer: PPO

## 2018-10-01 ENCOUNTER — Inpatient Hospital Stay: Payer: PPO | Admitting: Internal Medicine

## 2018-10-02 ENCOUNTER — Inpatient Hospital Stay: Payer: PPO

## 2018-10-02 ENCOUNTER — Other Ambulatory Visit: Payer: Self-pay

## 2018-10-02 ENCOUNTER — Other Ambulatory Visit: Payer: Self-pay | Admitting: *Deleted

## 2018-10-02 DIAGNOSIS — N183 Chronic kidney disease, stage 3 (moderate): Principal | ICD-10-CM

## 2018-10-02 DIAGNOSIS — D631 Anemia in chronic kidney disease: Secondary | ICD-10-CM

## 2018-10-02 LAB — CBC WITH DIFFERENTIAL/PLATELET
Abs Immature Granulocytes: 0.07 10*3/uL (ref 0.00–0.07)
Basophils Absolute: 0 10*3/uL (ref 0.0–0.1)
Basophils Relative: 1 %
Eosinophils Absolute: 0.1 10*3/uL (ref 0.0–0.5)
Eosinophils Relative: 2 %
HCT: 31.1 % — ABNORMAL LOW (ref 39.0–52.0)
Hemoglobin: 9.6 g/dL — ABNORMAL LOW (ref 13.0–17.0)
Immature Granulocytes: 1 %
Lymphocytes Relative: 21 %
Lymphs Abs: 1.3 10*3/uL (ref 0.7–4.0)
MCH: 30.2 pg (ref 26.0–34.0)
MCHC: 30.9 g/dL (ref 30.0–36.0)
MCV: 97.8 fL (ref 80.0–100.0)
Monocytes Absolute: 0.7 10*3/uL (ref 0.1–1.0)
Monocytes Relative: 11 %
Neutro Abs: 4.2 10*3/uL (ref 1.7–7.7)
Neutrophils Relative %: 64 %
Platelets: 241 10*3/uL (ref 150–400)
RBC: 3.18 MIL/uL — ABNORMAL LOW (ref 4.22–5.81)
RDW: 12.6 % (ref 11.5–15.5)
WBC: 6.5 10*3/uL (ref 4.0–10.5)
nRBC: 0 % (ref 0.0–0.2)

## 2018-10-02 LAB — BASIC METABOLIC PANEL
Anion gap: 9 (ref 5–15)
BUN: 29 mg/dL — ABNORMAL HIGH (ref 8–23)
CO2: 31 mmol/L (ref 22–32)
Calcium: 9.4 mg/dL (ref 8.9–10.3)
Chloride: 98 mmol/L (ref 98–111)
Creatinine, Ser: 1.41 mg/dL — ABNORMAL HIGH (ref 0.61–1.24)
GFR calc Af Amer: 52 mL/min — ABNORMAL LOW (ref >60)
GFR calc non Af Amer: 45 mL/min — ABNORMAL LOW (ref >60)
Glucose, Bld: 256 mg/dL — ABNORMAL HIGH (ref 70–99)
Potassium: 4.2 mmol/L (ref 3.5–5.1)
Sodium: 138 mmol/L (ref 135–145)

## 2018-10-04 ENCOUNTER — Inpatient Hospital Stay: Payer: PPO | Admitting: Internal Medicine

## 2018-10-04 ENCOUNTER — Telehealth: Payer: Self-pay | Admitting: *Deleted

## 2018-10-04 NOTE — Telephone Encounter (Signed)
Colette, please r/s this apt since I am unable to reach patient/patient's family.

## 2018-10-04 NOTE — Telephone Encounter (Signed)
Left vm for patient's daughter in attempt to reach pt for telehealth visit today. No response.

## 2018-10-07 DIAGNOSIS — R451 Restlessness and agitation: Secondary | ICD-10-CM | POA: Diagnosis not present

## 2018-10-07 DIAGNOSIS — R05 Cough: Secondary | ICD-10-CM | POA: Diagnosis not present

## 2018-10-08 ENCOUNTER — Telehealth: Payer: Self-pay | Admitting: *Deleted

## 2018-10-08 ENCOUNTER — Encounter: Payer: Self-pay | Admitting: Internal Medicine

## 2018-10-08 ENCOUNTER — Inpatient Hospital Stay: Payer: PPO | Attending: Internal Medicine | Admitting: Internal Medicine

## 2018-10-08 ENCOUNTER — Other Ambulatory Visit: Payer: Self-pay

## 2018-10-08 DIAGNOSIS — N189 Chronic kidney disease, unspecified: Secondary | ICD-10-CM | POA: Diagnosis not present

## 2018-10-08 DIAGNOSIS — D649 Anemia, unspecified: Secondary | ICD-10-CM

## 2018-10-08 DIAGNOSIS — R451 Restlessness and agitation: Secondary | ICD-10-CM | POA: Diagnosis not present

## 2018-10-08 DIAGNOSIS — C61 Malignant neoplasm of prostate: Secondary | ICD-10-CM

## 2018-10-08 DIAGNOSIS — R05 Cough: Secondary | ICD-10-CM | POA: Diagnosis not present

## 2018-10-08 NOTE — Telephone Encounter (Signed)
Family member Lori left vm requesting that office reach out to [employer] [phone] instead of [name] for telephone visits.  I did reach out to [place]. She would like to attempt a video chat with Dr. B this afternoon with she and her dad. I explained doximetry process and that she would receive a text msg. I completed the nursing portion of the visit to avoid a duplicate phone call this afternoon. She states that she will be working until 2pm. She would be waiting on a text msg from Dr. B at 245 pm today.  She is not "text savy" but is willing to try the video conference. She does have a web cam on her phone but has never used it before now.

## 2018-10-08 NOTE — Progress Notes (Signed)
I connected with Todd [name] on 10/08/18   2:45 PM EDT by video enabled telemedicine visit and verified that I am speaking with the correct person using two identifiers.  I discussed the limitations, risks, security and privacy concerns of performing an evaluation and management service by telemedicine and the availability of in-person appointments. I also discussed with the patient that there may be a patient responsible charge related to this service. The patient expressed understanding and agreed to proceed.    Other persons participating in the visit and their role in the encounter: daughters/ family.   Patient's location: Home Provider's location: Home  Oncology History   # CHRONIC ANEMIA-since 2016; June 2019 worsened- 9-10; Iron sat- 27%; NOT IDA/ no hemolysis; monoclonal work-up negative; B12 normal slightly low seen.  Question MDS-reluctant bone marrow biopsy  # Nov 2019- RLL- ? Infiltrate; repeat in 3 months.   # PROSTATE CANCER [>20 years]  # DEC 2018- stroke/Aphasia; Chronic CHF on 2-3 lits/O2.      Prostate cancer [facility])     Chief Complaint: Anemia/patient has aphasia hence family/daughter primary historians.  History of present illness:Todd [name] 85 y.o.  male with history of anemia of unclear etiology question chronic kidney disease/including congestive heart failure with medical problems including aphasia from stroke.  As per family patient is having shortness of breath for which she is on 3 L of oxygen.  Mild cough.  In the end of April 2020 he had a chest x-ray that showed mild pulmonary edema.  Since then he was evaluated by his PCP, is currently on Lasix 20 mg a day.  Of note his renal function creatinine is 1.4; and hemoglobin is 9.8.  Otherwise complains of ongoing fatigue.  No blood in stools or black or stools.  Observation/objective:  Assessment and plan: Normocytic anemia #Normocytic anemia hemoglobin around 8-10. Unclear etiology.  Highly suspicious for  MDS/ ? CKD.  No bone marrow biopsy because of pandemic.  # Chronic respiratory failure/CHF on 3 L of oxygen.  Recommend daily weight checking.  And close follow-up with PCP regarding management of diuretics.  # renal insufficiency: creatinine-1.4 chronic.  Stable  # Stroke- right sided/aphasia-chronic.  Stable  #Right lower lobe infiltrative lesion- [November 2019]-chest x-ray April 2020-mild fluid congestion no worsening infiltrate.  # DISPOSITION: #Follow-up in the first week of June-MD clinic-CBC BMP/BNP-Aranesp-Dr.B    Follow-up instructions:  I discussed the assessment and treatment plan with the patient.  The patient was provided an opportunity to ask questions and all were answered.  The patient agreed with the plan and demonstrated understanding of instructions.  The patient was advised to call back or seek an in person evaluation if the symptoms worsen or if the condition fails to improve as anticipated.    Dr. Charlaine Dalton [place] at [facility] 10/08/2018 3:17 PM

## 2018-10-08 NOTE — Assessment & Plan Note (Addendum)
#  Normocytic anemia hemoglobin around 8-10. Unclear etiology.  Highly suspicious for MDS/ ? CKD.  No bone marrow biopsy because of pandemic.  # Chronic respiratory failure/CHF on 3 L of oxygen.  Recommend daily weight checking.  And close follow-up with PCP regarding management of diuretics.  # renal insufficiency: creatinine-1.4 chronic.  Stable  # Stroke- right sided/aphasia-chronic.  Stable  #Right lower lobe infiltrative lesion- [November 2019]-chest x-ray April 2020-mild fluid congestion no worsening infiltrate.  # DISPOSITION: #Follow-up in the first week of June-MD clinic-CBC BMP/BNP-Aranesp-Dr.B

## 2018-10-10 DIAGNOSIS — I501 Left ventricular failure: Secondary | ICD-10-CM | POA: Diagnosis not present

## 2018-10-10 DIAGNOSIS — R0902 Hypoxemia: Secondary | ICD-10-CM | POA: Diagnosis not present

## 2018-10-10 DIAGNOSIS — I2510 Atherosclerotic heart disease of native coronary artery without angina pectoris: Secondary | ICD-10-CM | POA: Diagnosis not present

## 2018-10-14 ENCOUNTER — Ambulatory Visit (INDEPENDENT_AMBULATORY_CARE_PROVIDER_SITE_OTHER): Payer: PPO

## 2018-10-14 ENCOUNTER — Other Ambulatory Visit: Payer: Self-pay

## 2018-10-14 DIAGNOSIS — C61 Malignant neoplasm of prostate: Secondary | ICD-10-CM | POA: Diagnosis not present

## 2018-10-14 MED ORDER — LEUPROLIDE ACETATE (6 MONTH) 45 MG IM KIT
45.0000 mg | PACK | Freq: Once | INTRAMUSCULAR | Status: AC
  Administered 2018-10-14: 45 mg via INTRAMUSCULAR

## 2018-10-14 NOTE — Patient Instructions (Signed)

## 2018-10-14 NOTE — Progress Notes (Signed)
Lupron IM Injection   Due to Prostate Cancer patient is present today for a Lupron Injection.  Medication: Lupron 6 month Dose: 45 mg  Location: left deltoid (patient non weight bearing and unable to ambulate for gluteal injection) Lot: [id number] Exp: 11/04/2020  Patient tolerated well, no complications were noted  Performed by: Gordy Clement, CMA ([place])  Follow up: 20mos as scheduled

## 2018-10-15 LAB — PSA: Prostate Specific Ag, Serum: 3.5 ng/mL (ref 0.0–4.0)

## 2018-10-15 LAB — TESTOSTERONE: Testosterone: 5 ng/dL — ABNORMAL LOW (ref 264–916)

## 2018-10-16 ENCOUNTER — Other Ambulatory Visit: Payer: Self-pay

## 2018-10-16 ENCOUNTER — Telehealth (INDEPENDENT_AMBULATORY_CARE_PROVIDER_SITE_OTHER): Payer: PPO | Admitting: Urology

## 2018-10-16 DIAGNOSIS — C61 Malignant neoplasm of prostate: Secondary | ICD-10-CM | POA: Diagnosis not present

## 2018-10-16 NOTE — Progress Notes (Signed)
Virtual Visit via Telephone Note  I connected with Todd [name] on 10/16/18 at  2:30 PM EDT by telephone and verified that I am speaking with the correct person using two identifiers.  Location: Patient: Home Provider: Home   I discussed the limitations, risks, security and privacy concerns of performing an evaluation and management service by telephone and the availability of in person appointments. I also discussed with the patient that there may be a patient responsible charge related to this service. The patient expressed understanding and agreed to proceed.   History of Present Illness: 85 year old male with prostate cancer scheduled for telephone visit due to COVID-19 pandemic.  I was asked to call his daughter, Todd [name] who is on his DPR.  I last saw Todd [name] in November 2019.  His daughter states his voiding pattern is stable.  He has occasional urgency with urge incontinence.  He received a Lupron injection in the office on 10/14/2018.  PSA at that time was slightly higher at 3.5 (2.05 April 2018).   Observations/Objective:   Assessment and Plan: 85 year old male with history of prostate cancer initially treated with radiation therapy years ago.  He had biochemical recurrence and is currently on androgen deprivation.  Most recent PSA has increased slightly to 3.5 however was also 3.5 in July 2018 and then went back to baseline.  Will recheck in 6 months and if rising would recommend starting an androgen receptor inhibitor.  Follow Up Instructions: 6 months PSA/Lupron   I discussed the assessment and treatment plan with the patient. The patient was provided an opportunity to ask questions and all were answered. The patient agreed with the plan and demonstrated an understanding of the instructions.   The patient was advised to call back or seek an in-person evaluation if the symptoms worsen or if the condition fails to improve as anticipated.  I provided 5 minutes of  non-face-to-face time during this encounter.   Todd Sons, MD

## 2018-10-18 ENCOUNTER — Telehealth: Payer: Self-pay | Admitting: Family Medicine

## 2018-10-18 NOTE — Telephone Encounter (Signed)
Called and spoke with patient's daughter, she states that he is better than he was last night, and that he is getting the mucus up, but not completely out. Notified daughter that if he becomes short of breath over the weekend to take him to the ER.

## 2018-10-18 NOTE — Telephone Encounter (Signed)
If he cannot be seen in office today- I would advise her to take him to the ER

## 2018-10-18 NOTE — Telephone Encounter (Signed)
Copied from [place] [id number]. Topic: General - Other >> Oct 18, 2018  2:43 PM Leward Quan A wrote: Reason for CRM: Patient daughter called to say that even on the oxygen he is having a hard time breathing due to mucus which he is having a hard time coughing up. Asking for a  call back please  [name] Ph# [phone]

## 2018-10-24 ENCOUNTER — Other Ambulatory Visit: Payer: Self-pay | Admitting: Family Medicine

## 2018-10-24 NOTE — Telephone Encounter (Signed)
Requested medication (s) are due for refill today: Yes  Requested medication (s) are on the active medication list: Yes  Last refill:  09/23/18  Future visit scheduled: No  Notes to clinic:  See request    Requested Prescriptions  Pending Prescriptions Disp Refills   furosemide (LASIX) 20 MG tablet [Pharmacy Med Name: FUROSEMIDE 20 MG TABLET] 30 tablet 0    Sig: Take 1 tablet (20 mg total) by mouth daily.     Cardiovascular:  Diuretics - Loop Failed - 10/24/2018 10:53 AM      Failed - Cr in normal range and within 360 days    Creatinine, Ser  Date Value Ref Range Status  10/02/2018 1.41 (H) 0.61 - 1.24 mg/dL Final         Failed - Last BP in normal range    BP Readings from Last 1 Encounters:  10/02/18 (!) 157/76         Passed - K in normal range and within 360 days    Potassium  Date Value Ref Range Status  10/02/2018 4.2 3.5 - 5.1 mmol/L Final         Passed - Ca in normal range and within 360 days    Calcium  Date Value Ref Range Status  10/02/2018 9.4 8.9 - 10.3 mg/dL Final         Passed - Na in normal range and within 360 days    Sodium  Date Value Ref Range Status  10/02/2018 138 135 - 145 mmol/L Final  05/09/2018 145 (H) 134 - 144 mmol/L Final         Passed - Valid encounter within last 6 months    Recent Outpatient Visits          1 month ago Congestive heart failure, unspecified HF chronicity, unspecified heart failure type [facility])   [facility] [place], Jeannette How, MD   3 months ago Fatigue, unspecified type   [facility], [place], [place]   4 months ago Ear fullness, bilateral   [place], [place], [place]   5 months ago Anemia, unspecified type   [facility], [place], [place]   6 months ago Weakness   [place], [place], DO      Future Appointments            In 2 weeks Gollan, Kathlene November, MD [facility], [place]   In 6 months Carpenter, Ronda Fairly, [place]

## 2018-10-29 DIAGNOSIS — M25512 Pain in left shoulder: Secondary | ICD-10-CM | POA: Diagnosis not present

## 2018-11-04 ENCOUNTER — Other Ambulatory Visit: Payer: Self-pay

## 2018-11-04 ENCOUNTER — Other Ambulatory Visit: Payer: Self-pay | Admitting: *Deleted

## 2018-11-04 DIAGNOSIS — N183 Chronic kidney disease, stage 3 unspecified: Secondary | ICD-10-CM

## 2018-11-04 DIAGNOSIS — C61 Malignant neoplasm of prostate: Secondary | ICD-10-CM

## 2018-11-04 DIAGNOSIS — D631 Anemia in chronic kidney disease: Secondary | ICD-10-CM

## 2018-11-05 ENCOUNTER — Inpatient Hospital Stay: Payer: PPO

## 2018-11-05 ENCOUNTER — Inpatient Hospital Stay: Payer: PPO | Admitting: Internal Medicine

## 2018-11-05 ENCOUNTER — Telehealth: Payer: Self-pay | Admitting: *Deleted

## 2018-11-05 NOTE — Telephone Encounter (Signed)
Daughter called reporting that patient will not make his appointment today as he is not feeling good and has diarrhea. On further questioning, patient was given a laxative and per her it will "work him for a couple days". She states he is just feeling weak from the diarrhea. Denies fever or other symptoms

## 2018-11-06 ENCOUNTER — Telehealth: Payer: Self-pay

## 2018-11-06 NOTE — Telephone Encounter (Signed)
Called patient.  No answer. LMOV.  Need to change to telehealth visit.

## 2018-11-07 DIAGNOSIS — M25512 Pain in left shoulder: Secondary | ICD-10-CM | POA: Diagnosis not present

## 2018-11-07 NOTE — Telephone Encounter (Signed)
Called patient.  No answer. LMOV.  Need to change appointment to an Evisit.

## 2018-11-08 ENCOUNTER — Telehealth: Payer: Self-pay | Admitting: *Deleted

## 2018-11-08 ENCOUNTER — Telehealth: Payer: Self-pay | Admitting: Internal Medicine

## 2018-11-08 ENCOUNTER — Telehealth: Payer: Self-pay | Admitting: Cardiovascular Disease

## 2018-11-08 NOTE — Telephone Encounter (Signed)
Called Patient x3 No answer. LMOV.  This is the third attempt of reaching the patient to change him to a virtual visit.  Will cancel appointment and reschedule for a virtual appointment when he returns the call.

## 2018-11-08 NOTE — Telephone Encounter (Signed)
Virtual Visit Pre-Appointment Phone Call  "(Name), I am calling you today to discuss your upcoming appointment. We are currently trying to limit exposure to the virus that causes COVID-19 by seeing patients at home rather than in the office."  1. "What is the BEST phone number to call the day of the visit?" - include this in appointment notes  2. Do you have or have access to (through a family member/friend) a smartphone with video capability that we can use for your visit?" a. If yes - list this number in appt notes as cell (if different from BEST phone #) and list the appointment type as a VIDEO visit in appointment notes b. If no - list the appointment type as a PHONE visit in appointment notes  3. Confirm consent - "In the setting of the current Covid19 crisis, you are scheduled for a (phone or video) visit with your provider on (date) at (time).  Just as we do with many in-office visits, in order for you to participate in this visit, we must obtain consent.  If you'd like, I can send this to your mychart (if signed up) or email for you to review.  Otherwise, I can obtain your verbal consent now.  All virtual visits are billed to your insurance company just like a normal visit would be.  By agreeing to a virtual visit, we'd like you to understand that the technology does not allow for your provider to perform an examination, and thus may limit your provider's ability to fully assess your condition. If your provider identifies any concerns that need to be evaluated in person, we will make arrangements to do so.  Finally, though the technology is pretty good, we cannot assure that it will always work on either your or our end, and in the setting of a video visit, we may have to convert it to a phone-only visit.  In either situation, we cannot ensure that we have a secure connection.  Are you willing to proceed?" STAFF: Did the patient verbally acknowledge consent to telehealth visit? Document  YES/NO here: yes  4. Advise patient to be prepared - "Two hours prior to your appointment, go ahead and check your blood pressure, pulse, oxygen saturation, and your weight (if you have the equipment to check those) and write them all down. When your visit starts, your provider will ask you for this information. If you have an Apple Watch or Kardia device, please plan to have heart rate information ready on the day of your appointment. Please have a pen and paper handy nearby the day of the visit as well."  5. Give patient instructions for MyChart download to smartphone OR Doximity/Doxy.me as below if video visit (depending on what platform provider is using)  6. Inform patient they will receive a phone call 15 minutes prior to their appointment time (may be from unknown caller ID) so they should be prepared to answer    TELEPHONE CALL NOTE  [place] has been deemed a candidate for a follow-up tele-health visit to limit community exposure during the Covid-19 pandemic. I spoke with the patient via phone to ensure availability of phone/video source, confirm preferred email & phone number, and discuss instructions and expectations.  I reminded Todd [name] to be prepared with any vital sign and/or heart rhythm information that could potentially be obtained via home monitoring, at the time of his visit. I reminded Dawn Kiper to expect a phone call prior to his visit.  Clarisse Gouge 11/08/2018 4:32 PM   INSTRUCTIONS FOR DOWNLOADING THE MYCHART APP TO SMARTPHONE  - The patient must first make sure to have activated MyChart and know their login information - If Apple, go to [employer] and type in MyChart in the search bar and download the app. If Android, ask patient to go to [employer] and type in [place] in the search bar and download the app. The app is free but as with any other app downloads, their phone may require them to verify saved payment information or Apple/Android password.    - The patient will need to then log into the app with their MyChart username and password, and select [facility] as their healthcare provider to link the account. When it is time for your visit, go to the MyChart app, find appointments, and click Begin Video Visit. Be sure to Select Allow for your device to access the Microphone and Camera for your visit. You will then be connected, and your provider will be with you shortly.  **If they have any issues connecting, or need assistance please contact MyChart service desk [phone])**  **If using a computer, in order to ensure the best quality for their visit they will need to use either of the following Internet Browsers: [employer], or Google Chrome**  IF USING DOXIMITY or DOXY.ME - The patient will receive a link just prior to their visit by text.     FULL LENGTH CONSENT FOR TELE-HEALTH VISIT   I hereby voluntarily request, consent and authorize [facility] and its employed or contracted physicians, physician assistants, nurse practitioners or other licensed health care professionals (the Practitioner), to provide me with telemedicine health care services (the Services") as deemed necessary by the treating Practitioner. I acknowledge and consent to receive the Services by the Practitioner via telemedicine. I understand that the telemedicine visit will involve communicating with the Practitioner through live audiovisual communication technology and the disclosure of certain medical information by electronic transmission. I acknowledge that I have been given the opportunity to request an in-person assessment or other available alternative prior to the telemedicine visit and am voluntarily participating in the telemedicine visit.  I understand that I have the right to withhold or withdraw my consent to the use of telemedicine in the course of my care at any time, without affecting my right to future care or treatment, and that  the Practitioner or I may terminate the telemedicine visit at any time. I understand that I have the right to inspect all information obtained and/or recorded in the course of the telemedicine visit and may receive copies of available information for a reasonable fee.  I understand that some of the potential risks of receiving the Services via telemedicine include:   Delay or interruption in medical evaluation due to technological equipment failure or disruption;  Information transmitted may not be sufficient (e.g. poor resolution of images) to allow for appropriate medical decision making by the Practitioner; and/or   In rare instances, security protocols could fail, causing a breach of personal health information.  Furthermore, I acknowledge that it is my responsibility to provide information about my medical history, conditions and care that is complete and accurate to the best of my ability. I acknowledge that Practitioner's advice, recommendations, and/or decision may be based on factors not within their control, such as incomplete or inaccurate data provided by me or distortions of diagnostic images or specimens that may result from electronic transmissions. I understand that  the practice of medicine is not an exact science and that Practitioner makes no warranties or guarantees regarding treatment outcomes. I acknowledge that I will receive a copy of this consent concurrently upon execution via email to the email address I last provided but may also request a printed copy by calling the office of [facility].    I understand that my insurance will be billed for this visit.   I have read or had this consent read to me.  I understand the contents of this consent, which adequately explains the benefits and risks of the Services being provided via telemedicine.   I have been provided ample opportunity to ask questions regarding this consent and the Services and have had my questions answered to  my satisfaction.  I give my informed consent for the services to be provided through the use of telemedicine in my medical care  By participating in this telemedicine visit I agree to the above.

## 2018-11-08 NOTE — Telephone Encounter (Signed)
Daughter Cecille Rubin called. Left vm requesting a call back to discuss the purpose on Monday's apt. Unable to reach daughter at the number listed.

## 2018-11-10 DIAGNOSIS — R0902 Hypoxemia: Secondary | ICD-10-CM | POA: Diagnosis not present

## 2018-11-10 DIAGNOSIS — I2510 Atherosclerotic heart disease of native coronary artery without angina pectoris: Secondary | ICD-10-CM | POA: Diagnosis not present

## 2018-11-10 DIAGNOSIS — I501 Left ventricular failure: Secondary | ICD-10-CM | POA: Diagnosis not present

## 2018-11-11 ENCOUNTER — Inpatient Hospital Stay: Payer: PPO | Attending: Internal Medicine

## 2018-11-11 ENCOUNTER — Emergency Department: Payer: PPO

## 2018-11-11 ENCOUNTER — Other Ambulatory Visit: Payer: Self-pay

## 2018-11-11 ENCOUNTER — Inpatient Hospital Stay: Payer: PPO

## 2018-11-11 ENCOUNTER — Telehealth: Payer: Self-pay | Admitting: Internal Medicine

## 2018-11-11 ENCOUNTER — Inpatient Hospital Stay (HOSPITAL_BASED_OUTPATIENT_CLINIC_OR_DEPARTMENT_OTHER): Payer: PPO | Admitting: Internal Medicine

## 2018-11-11 ENCOUNTER — Encounter: Payer: Self-pay | Admitting: Emergency Medicine

## 2018-11-11 ENCOUNTER — Inpatient Hospital Stay
Admission: EM | Admit: 2018-11-11 | Discharge: 2018-11-17 | DRG: 308 | Disposition: A | Discharge status: Home / Self Care | Payer: PPO | Attending: Internal Medicine | Admitting: Internal Medicine

## 2018-11-11 VITALS — BP 88/62 | HR 125 | Temp 95.7°F | Resp 22 | Ht 67.0 in | Wt 199.0 lb

## 2018-11-11 DIAGNOSIS — Z8673 Personal history of transient ischemic attack (TIA), and cerebral infarction without residual deficits: Secondary | ICD-10-CM | POA: Diagnosis not present

## 2018-11-11 DIAGNOSIS — I10 Essential (primary) hypertension: Secondary | ICD-10-CM | POA: Diagnosis not present

## 2018-11-11 DIAGNOSIS — Z881 Allergy status to other antibiotic agents status: Secondary | ICD-10-CM

## 2018-11-11 DIAGNOSIS — F329 Major depressive disorder, single episode, unspecified: Secondary | ICD-10-CM | POA: Diagnosis present

## 2018-11-11 DIAGNOSIS — N183 Chronic kidney disease, stage 3 unspecified: Secondary | ICD-10-CM

## 2018-11-11 DIAGNOSIS — I69391 Dysphagia following cerebral infarction: Secondary | ICD-10-CM | POA: Diagnosis not present

## 2018-11-11 DIAGNOSIS — I959 Hypotension, unspecified: Secondary | ICD-10-CM | POA: Diagnosis not present

## 2018-11-11 DIAGNOSIS — D649 Anemia, unspecified: Secondary | ICD-10-CM

## 2018-11-11 DIAGNOSIS — I2510 Atherosclerotic heart disease of native coronary artery without angina pectoris: Secondary | ICD-10-CM | POA: Diagnosis not present

## 2018-11-11 DIAGNOSIS — I130 Hypertensive heart and chronic kidney disease with heart failure and stage 1 through stage 4 chronic kidney disease, or unspecified chronic kidney disease: Secondary | ICD-10-CM | POA: Diagnosis present

## 2018-11-11 DIAGNOSIS — R9431 Abnormal electrocardiogram [ECG] [EKG]: Secondary | ICD-10-CM | POA: Diagnosis not present

## 2018-11-11 DIAGNOSIS — E1165 Type 2 diabetes mellitus with hyperglycemia: Secondary | ICD-10-CM | POA: Diagnosis not present

## 2018-11-11 DIAGNOSIS — E119 Type 2 diabetes mellitus without complications: Secondary | ICD-10-CM | POA: Diagnosis not present

## 2018-11-11 DIAGNOSIS — D631 Anemia in chronic kidney disease: Secondary | ICD-10-CM

## 2018-11-11 DIAGNOSIS — R000 Tachycardia, unspecified: Secondary | ICD-10-CM | POA: Diagnosis not present

## 2018-11-11 DIAGNOSIS — Z87891 Personal history of nicotine dependence: Secondary | ICD-10-CM

## 2018-11-11 DIAGNOSIS — E291 Testicular hypofunction: Secondary | ICD-10-CM | POA: Diagnosis present

## 2018-11-11 DIAGNOSIS — F419 Anxiety disorder, unspecified: Secondary | ICD-10-CM | POA: Diagnosis not present

## 2018-11-11 DIAGNOSIS — N170 Acute kidney failure with tubular necrosis: Secondary | ICD-10-CM | POA: Diagnosis not present

## 2018-11-11 DIAGNOSIS — I484 Atypical atrial flutter: Secondary | ICD-10-CM | POA: Diagnosis present

## 2018-11-11 DIAGNOSIS — I4892 Unspecified atrial flutter: Secondary | ICD-10-CM | POA: Diagnosis not present

## 2018-11-11 DIAGNOSIS — E782 Mixed hyperlipidemia: Secondary | ICD-10-CM | POA: Diagnosis not present

## 2018-11-11 DIAGNOSIS — I361 Nonrheumatic tricuspid (valve) insufficiency: Secondary | ICD-10-CM | POA: Diagnosis not present

## 2018-11-11 DIAGNOSIS — K219 Gastro-esophageal reflux disease without esophagitis: Secondary | ICD-10-CM | POA: Diagnosis present

## 2018-11-11 DIAGNOSIS — Z7984 Long term (current) use of oral hypoglycemic drugs: Secondary | ICD-10-CM

## 2018-11-11 DIAGNOSIS — K8020 Calculus of gallbladder without cholecystitis without obstruction: Secondary | ICD-10-CM | POA: Diagnosis not present

## 2018-11-11 DIAGNOSIS — Z7189 Other specified counseling: Secondary | ICD-10-CM | POA: Diagnosis not present

## 2018-11-11 DIAGNOSIS — I639 Cerebral infarction, unspecified: Secondary | ICD-10-CM | POA: Diagnosis not present

## 2018-11-11 DIAGNOSIS — I4891 Unspecified atrial fibrillation: Secondary | ICD-10-CM | POA: Diagnosis not present

## 2018-11-11 DIAGNOSIS — F0390 Unspecified dementia without behavioral disturbance: Secondary | ICD-10-CM | POA: Diagnosis not present

## 2018-11-11 DIAGNOSIS — Z79899 Other long term (current) drug therapy: Secondary | ICD-10-CM

## 2018-11-11 DIAGNOSIS — Z7902 Long term (current) use of antithrombotics/antiplatelets: Secondary | ICD-10-CM

## 2018-11-11 DIAGNOSIS — I493 Ventricular premature depolarization: Secondary | ICD-10-CM | POA: Insufficient documentation

## 2018-11-11 DIAGNOSIS — I69359 Hemiplegia and hemiparesis following cerebral infarction affecting unspecified side: Secondary | ICD-10-CM | POA: Diagnosis not present

## 2018-11-11 DIAGNOSIS — R1310 Dysphagia, unspecified: Secondary | ICD-10-CM | POA: Diagnosis present

## 2018-11-11 DIAGNOSIS — J9601 Acute respiratory failure with hypoxia: Secondary | ICD-10-CM | POA: Diagnosis present

## 2018-11-11 DIAGNOSIS — C61 Malignant neoplasm of prostate: Secondary | ICD-10-CM

## 2018-11-11 DIAGNOSIS — F0150 Vascular dementia without behavioral disturbance: Secondary | ICD-10-CM | POA: Diagnosis present

## 2018-11-11 DIAGNOSIS — I351 Nonrheumatic aortic (valve) insufficiency: Secondary | ICD-10-CM | POA: Diagnosis not present

## 2018-11-11 DIAGNOSIS — R531 Weakness: Secondary | ICD-10-CM | POA: Diagnosis not present

## 2018-11-11 DIAGNOSIS — I5032 Chronic diastolic (congestive) heart failure: Secondary | ICD-10-CM | POA: Diagnosis present

## 2018-11-11 DIAGNOSIS — R7989 Other specified abnormal findings of blood chemistry: Secondary | ICD-10-CM | POA: Diagnosis present

## 2018-11-11 DIAGNOSIS — I471 Supraventricular tachycardia: Secondary | ICD-10-CM | POA: Diagnosis not present

## 2018-11-11 DIAGNOSIS — H409 Unspecified glaucoma: Secondary | ICD-10-CM | POA: Diagnosis not present

## 2018-11-11 DIAGNOSIS — Z7982 Long term (current) use of aspirin: Secondary | ICD-10-CM

## 2018-11-11 DIAGNOSIS — Z888 Allergy status to other drugs, medicaments and biological substances status: Secondary | ICD-10-CM

## 2018-11-11 DIAGNOSIS — Z823 Family history of stroke: Secondary | ICD-10-CM

## 2018-11-11 DIAGNOSIS — Z9981 Dependence on supplemental oxygen: Secondary | ICD-10-CM

## 2018-11-11 DIAGNOSIS — I69320 Aphasia following cerebral infarction: Secondary | ICD-10-CM | POA: Diagnosis not present

## 2018-11-11 DIAGNOSIS — I4430 Unspecified atrioventricular block: Secondary | ICD-10-CM | POA: Diagnosis not present

## 2018-11-11 DIAGNOSIS — E538 Deficiency of other specified B group vitamins: Secondary | ICD-10-CM

## 2018-11-11 DIAGNOSIS — Z8249 Family history of ischemic heart disease and other diseases of the circulatory system: Secondary | ICD-10-CM

## 2018-11-11 DIAGNOSIS — Z96653 Presence of artificial knee joint, bilateral: Secondary | ICD-10-CM | POA: Diagnosis present

## 2018-11-11 DIAGNOSIS — Z20828 Contact with and (suspected) exposure to other viral communicable diseases: Secondary | ICD-10-CM | POA: Diagnosis present

## 2018-11-11 DIAGNOSIS — I420 Dilated cardiomyopathy: Secondary | ICD-10-CM | POA: Diagnosis not present

## 2018-11-11 DIAGNOSIS — N200 Calculus of kidney: Secondary | ICD-10-CM | POA: Diagnosis not present

## 2018-11-11 DIAGNOSIS — Z8546 Personal history of malignant neoplasm of prostate: Secondary | ICD-10-CM

## 2018-11-11 DIAGNOSIS — I340 Nonrheumatic mitral (valve) insufficiency: Secondary | ICD-10-CM | POA: Diagnosis not present

## 2018-11-11 DIAGNOSIS — I272 Pulmonary hypertension, unspecified: Secondary | ICD-10-CM | POA: Diagnosis not present

## 2018-11-11 LAB — CBC WITH DIFFERENTIAL/PLATELET
Abs Immature Granulocytes: 0.11 K/uL — ABNORMAL HIGH (ref 0.00–0.07)
Basophils Absolute: 0.1 K/uL (ref 0.0–0.1)
Basophils Relative: 1 %
Eosinophils Absolute: 0.3 K/uL (ref 0.0–0.5)
Eosinophils Relative: 3 %
HCT: 32.6 % — ABNORMAL LOW (ref 39.0–52.0)
Hemoglobin: 10 g/dL — ABNORMAL LOW (ref 13.0–17.0)
Immature Granulocytes: 1 %
Lymphocytes Relative: 16 %
Lymphs Abs: 1.2 K/uL (ref 0.7–4.0)
MCH: 29.9 pg (ref 26.0–34.0)
MCHC: 30.7 g/dL (ref 30.0–36.0)
MCV: 97.6 fL (ref 80.0–100.0)
Monocytes Absolute: 0.7 K/uL (ref 0.1–1.0)
Monocytes Relative: 9 %
Neutro Abs: 5.4 K/uL (ref 1.7–7.7)
Neutrophils Relative %: 70 %
Platelets: 220 K/uL (ref 150–400)
RBC: 3.34 MIL/uL — ABNORMAL LOW (ref 4.22–5.81)
RDW: 13 % (ref 11.5–15.5)
WBC: 7.7 K/uL (ref 4.0–10.5)
nRBC: 0 % (ref 0.0–0.2)

## 2018-11-11 LAB — BASIC METABOLIC PANEL
Anion gap: 12 (ref 5–15)
BUN: 33 mg/dL — ABNORMAL HIGH (ref 8–23)
CO2: 28 mmol/L (ref 22–32)
Calcium: 9.4 mg/dL (ref 8.9–10.3)
Chloride: 99 mmol/L (ref 98–111)
Creatinine, Ser: 1.65 mg/dL — ABNORMAL HIGH (ref 0.61–1.24)
GFR calc Af Amer: 43 mL/min — ABNORMAL LOW (ref >60)
GFR calc non Af Amer: 37 mL/min — ABNORMAL LOW (ref >60)
Glucose, Bld: 347 mg/dL — ABNORMAL HIGH (ref 70–99)
Potassium: 4 mmol/L (ref 3.5–5.1)
Sodium: 139 mmol/L (ref 135–145)

## 2018-11-11 LAB — HEPATIC FUNCTION PANEL
ALT: 12 U/L (ref 0–44)
AST: 21 U/L (ref 15–41)
Albumin: 4.1 g/dL (ref 3.5–5.0)
Alkaline Phosphatase: 67 U/L (ref 38–126)
Bilirubin, Direct: 0.2 mg/dL (ref 0.0–0.2)
Indirect Bilirubin: 0.9 mg/dL (ref 0.3–0.9)
Total Bilirubin: 1.1 mg/dL (ref 0.3–1.2)
Total Protein: 7.3 g/dL (ref 6.5–8.1)

## 2018-11-11 LAB — PROTIME-INR
INR: 1 (ref 0.8–1.2)
Prothrombin Time: 13.4 seconds (ref 11.4–15.2)

## 2018-11-11 LAB — SAMPLE TO BLOOD BANK

## 2018-11-11 LAB — TROPONIN I
Troponin I: 0.05 ng/mL (ref <0.03)
Troponin I: 0.05 ng/mL (ref <0.03)
Troponin I: 0.04 ng/mL (ref <0.03)

## 2018-11-11 LAB — SARS CORONAVIRUS 2 BY RT PCR (HOSPITAL ORDER, PERFORMED IN ~~LOC~~ HOSPITAL LAB): SARS Coronavirus 2: NEGATIVE

## 2018-11-11 LAB — BRAIN NATRIURETIC PEPTIDE: B Natriuretic Peptide: 472 pg/mL — ABNORMAL HIGH (ref 0.0–100.0)

## 2018-11-11 LAB — LACTIC ACID, PLASMA: Lactic Acid, Venous: 1.9 mmol/L (ref 0.5–1.9)

## 2018-11-11 LAB — TSH: TSH: 0.936 u[IU]/mL (ref 0.350–4.500)

## 2018-11-11 LAB — GLUCOSE, CAPILLARY: Glucose-Capillary: 234 mg/dL — ABNORMAL HIGH (ref 70–99)

## 2018-11-11 LAB — HEMOGLOBIN A1C
Hgb A1c MFr Bld: 9.2 % — ABNORMAL HIGH (ref 4.8–5.6)
Mean Plasma Glucose: 217.34 mg/dL

## 2018-11-11 MED ORDER — ACETAMINOPHEN 325 MG PO TABS: 650.0000 mg | ORAL_TABLET | Freq: Four times a day (QID) | ORAL | Status: DC | PRN

## 2018-11-11 MED ORDER — SODIUM CHLORIDE 0.9% FLUSH
3.0000 mL | Freq: Two times a day (BID) | INTRAVENOUS | Status: DC
  Administered 2018-11-11 – 2018-11-17 (×12): 3 mL via INTRAVENOUS

## 2018-11-11 MED ORDER — CLOPIDOGREL BISULFATE 75 MG PO TABS
75.0000 mg | ORAL_TABLET | Freq: Every day | ORAL | Status: DC
  Administered 2018-11-12 – 2018-11-13 (×2): 75 mg via ORAL
  Filled 2018-11-11 (×2): qty 1

## 2018-11-11 MED ORDER — ENOXAPARIN SODIUM 40 MG/0.4ML ~~LOC~~ SOLN
40.0000 mg | SUBCUTANEOUS | Status: DC
  Administered 2018-11-11 – 2018-11-12 (×2): 40 mg via SUBCUTANEOUS
  Filled 2018-11-11 (×2): qty 0.4

## 2018-11-11 MED ORDER — PANTOPRAZOLE SODIUM 40 MG PO TBEC: 40.0000 mg | DELAYED_RELEASE_TABLET | Freq: Every day | ORAL | Status: DC

## 2018-11-11 MED ORDER — SODIUM CHLORIDE 0.9 % IV BOLUS
1000.0000 mL | Freq: Once | INTRAVENOUS | Status: AC
  Administered 2018-11-11: 1000 mL via INTRAVENOUS

## 2018-11-11 MED ORDER — FUROSEMIDE 20 MG PO TABS
20.0000 mg | ORAL_TABLET | Freq: Every day | ORAL | Status: DC
  Administered 2018-11-12 – 2018-11-15 (×4): 20 mg via ORAL
  Filled 2018-11-11 (×4): qty 1

## 2018-11-11 MED ORDER — LATANOPROST 0.005 % OP SOLN
1.0000 [drp] | Freq: Every day | OPHTHALMIC | Status: DC
  Administered 2018-11-11 – 2018-11-16 (×6): 1 [drp] via OPHTHALMIC
  Filled 2018-11-11: qty 2.5

## 2018-11-11 MED ORDER — SODIUM CHLORIDE 0.9% FLUSH
3.0000 mL | INTRAVENOUS | Status: DC | PRN
  Administered 2018-11-15: 09:00:00 3 mL via INTRAVENOUS
  Filled 2018-11-11: qty 3

## 2018-11-11 MED ORDER — MAGNESIUM OXIDE 400 (241.3 MG) MG PO TABS
400.0000 mg | ORAL_TABLET | Freq: Two times a day (BID) | ORAL | Status: DC
  Administered 2018-11-11 – 2018-11-17 (×12): 400 mg via ORAL
  Filled 2018-11-11 (×12): qty 1

## 2018-11-11 MED ORDER — SODIUM CHLORIDE 0.9 % IV SOLN
250.0000 mL | INTRAVENOUS | Status: DC | PRN
  Administered 2018-11-11: 250 mL via INTRAVENOUS

## 2018-11-11 MED ORDER — POLYETHYLENE GLYCOL 3350 17 G PO PACK: 17.0000 g | PACK | Freq: Every day | ORAL | Status: DC | PRN

## 2018-11-11 MED ORDER — GABAPENTIN 100 MG PO CAPS
100.0000 mg | ORAL_CAPSULE | Freq: Two times a day (BID) | ORAL | Status: DC
  Administered 2018-11-11 – 2018-11-17 (×12): 100 mg via ORAL
  Filled 2018-11-11 (×12): qty 1

## 2018-11-11 MED ORDER — RESOURCE THICKENUP CLEAR PO POWD
1.0000 | ORAL | Status: DC | PRN
  Filled 2018-11-11: qty 125

## 2018-11-11 MED ORDER — ASPIRIN EC 81 MG PO TBEC
81.0000 mg | DELAYED_RELEASE_TABLET | Freq: Every day | ORAL | Status: DC
  Administered 2018-11-12 – 2018-11-13 (×2): 81 mg via ORAL
  Filled 2018-11-11 (×2): qty 1

## 2018-11-11 MED ORDER — INSULIN ASPART 100 UNIT/ML ~~LOC~~ SOLN
0.0000 [IU] | Freq: Every day | SUBCUTANEOUS | Status: DC
  Administered 2018-11-11: 2 [IU] via SUBCUTANEOUS
  Filled 2018-11-11: qty 1

## 2018-11-11 MED ORDER — PROPRANOLOL HCL ER 120 MG PO CP24
120.0000 mg | ORAL_CAPSULE | Freq: Every day | ORAL | Status: DC
  Filled 2018-11-11 (×2): qty 1

## 2018-11-11 MED ORDER — HYDROCHLOROTHIAZIDE 25 MG PO TABS: 25.0000 mg | ORAL_TABLET | Freq: Every day | ORAL | Status: DC

## 2018-11-11 MED ORDER — ALBUTEROL SULFATE (2.5 MG/3ML) 0.083% IN NEBU
2.5000 mg | INHALATION_SOLUTION | Freq: Four times a day (QID) | RESPIRATORY_TRACT | Status: DC
  Administered 2018-11-11 – 2018-11-12 (×2): 2.5 mg via RESPIRATORY_TRACT
  Filled 2018-11-11 (×3): qty 3

## 2018-11-11 MED ORDER — ONDANSETRON HCL 4 MG PO TABS: 4.0000 mg | ORAL_TABLET | Freq: Four times a day (QID) | ORAL | Status: DC | PRN

## 2018-11-11 MED ORDER — PANTOPRAZOLE SODIUM 40 MG IV SOLR
40.0000 mg | INTRAVENOUS | Status: DC
  Administered 2018-11-12 – 2018-11-15 (×4): 40 mg via INTRAVENOUS
  Filled 2018-11-11 (×4): qty 40

## 2018-11-11 MED ORDER — DILTIAZEM LOAD VIA INFUSION
10.0000 mg | Freq: Once | INTRAVENOUS | Status: AC
  Administered 2018-11-11: 10 mg via INTRAVENOUS

## 2018-11-11 MED ORDER — INSULIN ASPART 100 UNIT/ML ~~LOC~~ SOLN
0.0000 [IU] | Freq: Three times a day (TID) | SUBCUTANEOUS | Status: DC
  Administered 2018-11-12 (×2): 7 [IU] via SUBCUTANEOUS
  Administered 2018-11-12 – 2018-11-13 (×2): 3 [IU] via SUBCUTANEOUS
  Administered 2018-11-13: 7 [IU] via SUBCUTANEOUS
  Administered 2018-11-13: 4 [IU] via SUBCUTANEOUS
  Administered 2018-11-14: 7 [IU] via SUBCUTANEOUS
  Administered 2018-11-14: 4 [IU] via SUBCUTANEOUS
  Administered 2018-11-14: 7 [IU] via SUBCUTANEOUS
  Administered 2018-11-15 (×2): 3 [IU] via SUBCUTANEOUS
  Administered 2018-11-15 – 2018-11-16 (×2): 4 [IU] via SUBCUTANEOUS
  Administered 2018-11-16 – 2018-11-17 (×2): 7 [IU] via SUBCUTANEOUS
  Filled 2018-11-11 (×15): qty 1

## 2018-11-11 MED ORDER — ONDANSETRON HCL 4 MG/2ML IJ SOLN
4.0000 mg | Freq: Four times a day (QID) | INTRAMUSCULAR | Status: DC | PRN
  Administered 2018-11-14: 4 mg via INTRAVENOUS
  Filled 2018-11-11: qty 2

## 2018-11-11 MED ORDER — IRBESARTAN 150 MG PO TABS
150.0000 mg | ORAL_TABLET | Freq: Every day | ORAL | Status: DC
  Administered 2018-11-12: 150 mg via ORAL
  Filled 2018-11-11: qty 1

## 2018-11-11 MED ORDER — HYDRALAZINE HCL 25 MG PO TABS
25.0000 mg | ORAL_TABLET | Freq: Three times a day (TID) | ORAL | Status: DC
  Administered 2018-11-11 – 2018-11-12 (×2): 25 mg via ORAL
  Filled 2018-11-11 (×2): qty 1

## 2018-11-11 MED ORDER — DULOXETINE HCL 30 MG PO CPEP
60.0000 mg | ORAL_CAPSULE | Freq: Every day | ORAL | Status: DC
  Administered 2018-11-12 – 2018-11-17 (×6): 60 mg via ORAL
  Filled 2018-11-11 (×6): qty 2

## 2018-11-11 MED ORDER — DILTIAZEM HCL 100 MG IV SOLR
5.0000 mg/h | INTRAVENOUS | Status: DC
  Administered 2018-11-11: 5 mg/h via INTRAVENOUS
  Administered 2018-11-11: 7.5 mg/h via INTRAVENOUS
  Filled 2018-11-11 (×2): qty 100

## 2018-11-11 MED ORDER — ATORVASTATIN CALCIUM 10 MG PO TABS
10.0000 mg | ORAL_TABLET | Freq: Every day | ORAL | Status: DC
  Administered 2018-11-12 – 2018-11-17 (×6): 10 mg via ORAL
  Filled 2018-11-11 (×6): qty 1

## 2018-11-11 NOTE — Assessment & Plan Note (Addendum)
#  Normocytic anemia hemoglobin around ~10 today.  MDS/CKD.  No bone marrow biopsy.  Hold Aranesp as hemoglobin is 10.  #Tachycardia irregular rhythm-EKG independently reviewed-A. fib with RVR versus junctional tachycardia; blood pressure systolic [id number] urgent evaluation in the emergency room.  # Chronic respiratory failure/CHF on 3 L of oxygen.  Stable  # renal insufficiency: Today creatinine 1.6 slightly worse than baseline.  Question secondary diuretics.  # Stroke- right sided/aphasia-chronic.  Stable  #Right lower lobe infiltrative lesion- [November 2019]-chest x-ray April 2020-mild fluid congestion no worsening infiltrate.  #I called the daughter Ivory Broad updated her of her father's clinical status/evaluation of the emergency room.  Unable to reach Asa Saunas brought the patient to the cancer center]; unable to reach other daughter [name].  # DISPOSITION: #Recommend urgent evaluation in the emergency room.

## 2018-11-11 NOTE — ED Triage Notes (Signed)
PT from [facility] with c/o tachycardia. PT nonverbal, hx of stroke. PT family at bedside, states c/o weakness. PT on o2 chronic. MD at bedside

## 2018-11-11 NOTE — H&P (Signed)
[facility] NAME: Todd [name]    MR#:  [id number]  DATE OF BIRTH:  03/23/1931  DATE OF ADMISSION:  11/11/2018  PRIMARY CARE PHYSICIAN: Guadalupe Maple, MD   REQUESTING/REFERRING PHYSICIAN:   CHIEF COMPLAINT:   Chief Complaint  Patient presents with  . Tachycardia    HISTORY OF PRESENT ILLNESS: Todd [name]  is a 85 y.o. male with a known history per below, history of atrial fibrillation per daughter-not on oral anticoagulation, presenting from oncology clinic/Dr. 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 Fletcher Anon, hospitalist asked to admit, patient evaluated the bedside, no apparent distress, resting comfortably in bed, daughter at the bedside, patient now been admitted for acute recurrent A. fib with RVR.  PAST MEDICAL HISTORY:   Past Medical History:  Diagnosis Date  . Anxiety   . Aortic regurgitation    a. 03/2017 Echo: Mod AI; b. 05/2017 Echo: Triv AI.  [name] Kitchen Arthritis    osteoarthritis-left knee  . CAD (coronary artery disease)    a. Reported h/o cath ~ 2008 - ? small vessel dzs. No PCI performed; b. 03/2017 MV: EF 45-54%, no ischemia/infarct.  . Depression   . Diabetes mellitus without complication ([place])   . Diastolic dysfunction    a. 03/2017 Echo: EF 55-65%; b. 05/2017 Echo: EF 55-60%, no rwma, Gr1 DD. Triv AI.  Mildly dil LA.  . ED (erectile dysfunction)   . Elevated troponin    a. chronic mild elevation - 0.03->0.04.  [name] Kitchen GERD (gastroesophageal reflux disease)   . Glaucoma   . Hyperlipidemia   . Hypertension   . Iron deficiency  anemia due to chronic blood loss   . Left pontine stroke w/ cerebrovascular disease(H[facility])    a. 05/2017 MRI/A: acute to subacute L pontine infarct. Chronic left thalamic lacunar infarct. Severe basilar artery stenosis w/ radiographic string sign of the distal 1/3. Mild to moderate L PCA P2 segment stenosis; b. 05/2017 Carotid U/S: <50% bilat ICA stenosis.  . Lumbago    Lumbosacral Neuritis  . Prostate cancer ([place])     PAST SURGICAL HISTORY:  Past Surgical History:  Procedure Laterality Date  . CARPAL TUNNEL RELEASE    . REPLACEMENT TOTAL KNEE BILATERAL      SOCIAL HISTORY:  Social History   Tobacco Use  . Smoking status: Former Smoker    Last attempt to quit: 12/02/1994    Years since quitting: 23.9  . Smokeless tobacco: Never Used  Substance Use Topics  . Alcohol use: Yes    Alcohol/week: 1.0 standard drinks    Types: 1 Cans of beer per week    Comment: occasional    FAMILY HISTORY:  Family History  Problem Relation Age of Onset  . Stroke Mother   . Hypertension Mother   . Hypertension Father   . Stroke Sister   . Prostate cancer Neg Hx   . Kidney cancer Neg Hx   . Bladder Cancer Neg Hx     DRUG ALLERGIES:  Allergies  Allergen Reactions  . Amlodipine     Lower extremity swelling at 10 mg.  [name] Kitchen  Fluoxetine Other (See Comments)    headache  . Meloxicam Other (See Comments)    Other reaction(s): Dizziness Nervousness.     REVIEW OF SYSTEMS: Unable to be obtained given dementia from strokes  CONSTITUTIONAL: No fever, fatigue or weakness.  EYES: No blurred or double vision.  EARS, NOSE, AND THROAT: No tinnitus or ear pain.  RESPIRATORY: No cough, shortness of breath, wheezing or hemoptysis.  CARDIOVASCULAR: No chest pain, orthopnea, edema.  GASTROINTESTINAL: No nausea, vomiting, diarrhea or abdominal pain.  GENITOURINARY: No dysuria, hematuria.  ENDOCRINE: No polyuria, nocturia,  HEMATOLOGY: No anemia, easy bruising or bleeding SKIN: No rash or  lesion. MUSCULOSKELETAL: No joint pain or arthritis.   NEUROLOGIC: No tingling, numbness, weakness.  PSYCHIATRY: No anxiety or depression.   MEDICATIONS AT HOME:  Prior to Admission medications   Medication Sig Start Date End Date Taking? Authorizing Provider  acetaminophen (TYLENOL 8 HOUR ARTHRITIS PAIN) 650 MG CR tablet Take 650 mg by mouth every 8 (eight) hours as needed for pain.   Yes [provider]  aspirin EC 81 MG tablet Take 81 mg by mouth daily.   Yes [provider]  atorvastatin (LIPITOR) 10 MG tablet Take 1 tablet (10 mg total) by mouth daily. 07/07/18  Yes Crissman, Jeannette How, MD  clopidogrel (PLAVIX) 75 MG tablet Take 1 tablet (75 mg total) by mouth daily. 12/20/17  Yes Guadalupe Maple, MD  cyanocobalamin (,VITAMIN B-12,) 1000 MCG/ML injection 19m weekly x4 weeks, then 123mmonthly after that 05/07/18  Yes Crissman, MaJeannette HowMD  DULoxetine (CYMBALTA) 60 MG capsule Take 1 capsule (60 mg total) by mouth daily. 04/25/18  Yes Johnson, Megan P, DO  furosemide (LASIX) 20 MG tablet Take 1 tablet (20 mg total) by mouth daily. 10/24/18  Yes Crissman, MaJeannette HowMD  gabapentin (NEURONTIN) 100 MG capsule Take 100 mg by mouth 2 (two) times daily. Once a day at night   Yes [provider]  hydrALAZINE (APRESOLINE) 25 MG tablet Take 1 tablet (25 mg total) by mouth 3 (three) times daily. 09/29/18  Yes Crissman, MaJeannette HowMD  hydrochlorothiazide (HYDRODIURIL) 25 MG tablet Take 1 tablet (25 mg total) by mouth daily. 12/20/17  Yes Crissman, MaJeannette HowMD  latanoprost (XALATAN) 0.005 % ophthalmic solution Place 1 drop into both eyes at bedtime.  06/08/16  Yes [provider]  magnesium oxide (MAG-OX) 400 MG tablet Take 1 tablet (400 mg total) by mouth 2 (two) times daily. 05/10/18  Yes Dunn, RyAreta HaberPA-C  metFORMIN (GLUCOPHAGE) 500 MG tablet Take 1 tablet (500 mg total) by mouth daily with breakfast. 12/20/17  Yes Crissman, MaJeannette HowMD  naproxen sodium (ALEVE) 220 MG tablet Take 220 mg  by mouth 2 (two) times daily as needed.    Yes [provider]  pantoprazole (PROTONIX) 40 MG tablet Take 1 tablet (40 mg total) by mouth daily. 08/27/18  Yes Gollan, TiKathlene NovemberMD  propranolol ER (INDERAL LA) 120 MG 24 hr capsule Take 1 capsule (120 mg total) by mouth daily. 05/20/18  Yes Dunn, RyAreta HaberPA-C  telmisartan (MICARDIS) 80 MG tablet Take 1 tablet (80 mg total) by mouth daily. 12/20/17  Yes Crissman, MaJeannette HowMD  Blood Glucose Monitoring Suppl (ACCU-CHEK AVIVA PLUS) w/Device KIT Use to check sugar levels x 2 daily 06/13/17   CrGuadalupe MapleMD  food thickener (RESOURCE THICKENUP CLEAR) POWD Follow instructions on package for nectar consistency in all liquids 04/25/18   JoPark Liter, DO  Lancets (ACCU-CHEK SOFT TOUCH) lancets Use as instructed 06/13/17   Guadalupe Maple, MD      PHYSICAL EXAMINATION:   VITAL SIGNS: Blood pressure 124/73, pulse (!) 127, temperature 98.7 F (37.1 C), temperature source Rectal, resp. rate 15, SpO2 100 %.  GENERAL:  85 y.o.-year-old patient lying in the bed with no acute distress.  Frail-appearing EYES: Pupils equal, round, reactive to light and accommodation. No scleral icterus. Extraocular muscles intact.  HEENT: Head atraumatic, normocephalic. Oropharynx and nasopharynx clear.  NECK:  Supple, no jugular venous distention. No thyroid enlargement, no tenderness.  LUNGS: Normal breath sounds bilaterally, no wheezing, rales,rhonchi or crepitation. No use of accessory muscles of respiration.  CARDIOVASCULAR: Irregular rate and rhythm. No murmurs, rubs, or gallops.  ABDOMEN: Soft, nontender, nondistended. Bowel sounds present. No organomegaly or mass.  EXTREMITIES: No pedal edema, cyanosis, or clubbing.  NEUROLOGIC: Cranial nerves II through XII are intact.  Aphasic.   MAES. Gait not checked.  PSYCHIATRIC: The patient is awake, alert, a phasic secondary to old strokes SKIN: No obvious rash, lesion, or ulcer.   LABORATORY PANEL:   CBC Recent  Labs  Lab 11/11/18 1130  WBC 7.7  HGB 10.0*  HCT 32.6*  PLT 220  MCV 97.6  MCH 29.9  MCHC 30.7  RDW 13.0  LYMPHSABS 1.2  MONOABS 0.7  EOSABS 0.3  BASOSABS 0.1   ------------------------------------------------------------------------------------------------------------------  Chemistries  Recent Labs  Lab 11/11/18 1130  NA 139  K 4.0  CL 99  CO2 28  GLUCOSE 347*  BUN 33*  CREATININE 1.65*  CALCIUM 9.4  AST 21  ALT 12  ALKPHOS 67  BILITOT 1.1   ------------------------------------------------------------------------------------------------------------------ estimated creatinine clearance is 34.5 mL/min (A) (by C-G formula based on SCr of 1.65 mg/dL (H)). ------------------------------------------------------------------------------------------------------------------ Recent Labs    11/11/18 1256  TSH 0.936     Coagulation profile Recent Labs  Lab 11/11/18 1256  INR 1.0   ------------------------------------------------------------------------------------------------------------------- No results for input(s): DDIMER in the last 72 hours. -------------------------------------------------------------------------------------------------------------------  Cardiac Enzymes Recent Labs  Lab 11/11/18 1256  TROPONINI 0.05*   ------------------------------------------------------------------------------------------------------------------ Invalid input(s): POCBNP  ---------------------------------------------------------------------------------------------------------------  Urinalysis    Component Value Date/Time   COLORURINE YELLOW (A) 04/20/2018 1824   APPEARANCEUR CLEAR (A) 04/20/2018 1824   APPEARANCEUR Clear 08/23/2017 1503   LABSPEC 1.016 04/20/2018 1824   PHURINE 6.0 04/20/2018 1824   GLUCOSEU 150 (A) 04/20/2018 1824   HGBUR NEGATIVE 04/20/2018 1824   BILIRUBINUR NEGATIVE 04/20/2018 1824   BILIRUBINUR Negative 08/23/2017 1503   KETONESUR  NEGATIVE 04/20/2018 1824   PROTEINUR NEGATIVE 04/20/2018 1824   NITRITE NEGATIVE 04/20/2018 1824   LEUKOCYTESUR NEGATIVE 04/20/2018 1824   LEUKOCYTESUR Negative 08/23/2017 1503     RADIOLOGY: Ct Abdomen Pelvis Wo Contrast  Result Date: 11/11/2018 CLINICAL DATA:  Tachycardia and weakness EXAM: CT ABDOMEN AND PELVIS WITHOUT CONTRAST TECHNIQUE: Multidetector CT imaging of the abdomen and pelvis was performed following the standard protocol without IV contrast. COMPARISON:  None. FINDINGS: Lower chest: Lung bases demonstrate no sizable infiltrate or effusion. Some scarring in the medial right lower lobe is noted. Hepatobiliary: Gallbladder is well distended. Some faint calcifications are noted along the inferior margin of the gallbladder which may represent adherent stones. The liver demonstrates a few calcified granulomas. Pancreas: Unremarkable. No pancreatic ductal dilatation or surrounding inflammatory changes. Spleen: Normal in size without focal abnormality. Adrenals/Urinary Tract: Adrenal glands are within normal limits. Scattered hypodensities are noted throughout both kidneys consistent with cysts but incompletely evaluated on today's exam. Nonobstructing renal  stones are seen bilaterally measuring less than 5 mm. The bladder is well distended. Stomach/Bowel: Appendix is not well visualized although no inflammatory changes to suggest appendicitis are noted. No obstructive or inflammatory changes of the bowel are noted. Vascular/Lymphatic: Aortic atherosclerosis. No enlarged abdominal or pelvic lymph nodes. Reproductive: Multiple brachytherapy seeds are noted within the prostate Other: No abdominal wall hernia or abnormality. No abdominopelvic ascites. Musculoskeletal: Degenerative changes of the lumbar spine are noted. IMPRESSION: Nonobstructing renal stones bilaterally. Small faintly calcified gallstones. No acute abnormality is noted. Electronically Signed   By: Inez Catalina M.D.   On: 11/11/2018  13:59   Dg Chest Portable 1 View  Result Date: 11/11/2018 CLINICAL DATA:  Generalized weakness, tachycardia EXAM: PORTABLE CHEST 1 VIEW COMPARISON:  12/17/2017, 09/18/2018 FINDINGS: There is chronic elevation of the right diaphragm. There is no focal parenchymal opacity. There is no pleural effusion or pneumothorax. There is stable cardiomegaly. There is severe bilateral glenohumeral osteoarthritis. IMPRESSION: No active disease. Electronically Signed   By: Kathreen Devoid   On: 11/11/2018 13:31    EKG: Orders placed or performed during the hospital encounter of 11/11/18  . ED EKG  . ED EKG    IMPRESSION AND PLAN: *Acute recurrent A. fib with RVR Not on oral anticoagulation Admit to telemetry bed, rule out acute coronary syndrome with cardiac enzymes x3 sets, continue Cardizem drip, continue aspirin/Plavix, cardiology/Dr. Fletcher Anon consulted for expert opinion-defer anticoagulation to cardiology, check echocardiogram, continue beta-blocker therapy  *Acute hyperglycemia with chronic diabetes mellitus type 2 Hold metformin while in house, slight scale insulin with Accu-Cheks per routine, check hemoglobin A1c determine level control  *Chronic diastolic congestive heart failure without exacerbation Continue aspirin, Plavix, statin therapy, Lasix, strict I&O monitoring, daily weights  *Chronic vascular dementia Increase nursing care PRN, aspiration/fall precautions while in house, dysphagia 2 diet with thickened liquids, speech therapy to see  *Chronic GERD PPI daily    All the records are reviewed and case discussed with ED provider. Management plans discussed with the patient, family and they are in agreement.  CODE STATUS:full Code Status History    Date Active Date Inactive Code Status Order ID Comments User Context   04/20/2018 2122 04/23/2018 1849 Full Code [id number]  Gladstone Lighter, MD Inpatient   05/18/2017 0303 05/21/2017 1928 Full Code [id number]  Lance Coon, MD Inpatient    03/17/2017 0419 03/17/2017 2109 Full Code [id number]  Saundra Shelling, MD ED   03/05/2015 0019 03/05/2015 1507 Full Code [id number]  Lance Coon, MD Inpatient       TOTAL TIME TAKING CARE OF THIS PATIENT: 40 minutes.    Avel Peace Floreine Kingdon M.D on 11/11/2018   Between 7am to 6pm - Pager - [phone]  After 6pm go to [email or web address] - password [email or web address] [facility]  Office  [phone]  CC: Primary care physician; Guadalupe Maple, MD   Note: This dictation was prepared with Dragon dictation along with smaller phrase technology. Any transcriptional errors that result from this process are unintentional.

## 2018-11-11 NOTE — Progress Notes (Signed)
Family Meeting Note  Advance Directive:yes  Today a meeting took place with the Patient, daughter.  Patient is able to participate  The following clinical team members were present during this meeting:MD  The following were discussed:Patient's diagnosis:85 y.o. male with a known history per below, history of atrial fibrillation per daughter-not on oral anticoagulation, presenting from oncology clinic/Dr. 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 Fletcher Anon, hospitalist asked to admit, patient evaluated the bedside, no apparent distress, resting comfortably in bed, daughter at the bedside, patient now been admitted for acute recurrent A. fib with RVR, Patient's progosis: Unable to determine and Goals for treatment: Full Code  Additional follow-up to be provided: prn  Time spent during discussion:20 minutes  Todd Harms, MD

## 2018-11-11 NOTE — ED Notes (Addendum)
Have not increased gtt at this time as HR ranging from 60s-110.  Dr Ellender Hose aware.

## 2018-11-11 NOTE — ED Notes (Signed)
Gave report to Stephanie, RN.

## 2018-11-11 NOTE — Consult Note (Signed)
Cardiology Consultation:   Patient ID: Todd [name] MRN: [id number]; DOB: 1931/01/17  Admit date: 11/11/2018 Date of Consult: 11/11/2018  Primary Care Provider: Guadalupe Maple, MD Primary Cardiologist: Todd Rogue, MD  Primary Electrophysiologist:  None    Patient Profile:   Todd [name] is a 85 y.o. 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 Ellender [name].  History of Present Illness:   Todd [name] is an 85 yo male with PMH as above.   2016 Echo showed EF 55-60%, no RWMA, G1DD, trivial AI, calcified mitral annulus, mildly dilate left atrium, PASP 6mHg. 2018 Echo unchanged from 2016 & nuclear stress test without signs of significant ischemia and ruled low risk study.  He was admitted to the hospital 05/2017 with CVA and MRI brain showing acute to subacute left pontine infarct as well as chronic left thalamic lacunar infarct.  Repeat echo showed EF of 55 to 60%, no regional wall motion abnormality, G1 DD, trivial AI, calcified mitral annulus, mildly dilated left atrium.  Carotid artery ultrasound showed 1-39 bilateral ICA stenosis.  He has been managed with ASA and Plavix per neurology.  [facility] family's complaint of chronic fatigue and has been seen in the ED[facility] by [facility]CP several times since then for fatigue.   [facility] an admission for fatigue with family still indicating their concern regarding the fatigue. This fatigue dated back to his stroke and thought likely multifactorial in the setting of his stroke with residual deficits, anemia, hypogonadism, prostate cancer, significant physical deconditioning, and  likely ventricular ectopy.  05/17/2018 heart monitor showed overall sinus rhythm with an average heart rate of 68 bpm.  There/e were 1639 runs of atrial tachycardia/SVT with the fastest run lasting 5 beats with a maximum rate of 179 bpm.  The longest run lasted 13 beats with an average rate of 104 bpm.  There are isolated PACs, atrial couplets, and rare atrial triplets.  There were also frequent PVCs (8.2% burden), rare ventricular couplets, and no ventricular triplets.  Ventricular bigeminy and trigeminy were noted.  The recommendation at that time was to proceed with echocardiogram.  Given his atrial and ventricular ectopy burden, it was also recommended he increase his propranolol to 120 mg daily.  It was also noted the potential need to discuss possible ischemic evaluation with the patient and his family at follow-up and pending his echo results.  06/03/2018 echo showed EF 60-65%, no regional wall motion abnormalities, G1 DD, trivial AI, mildly dilated aortic root 3.6 cm, and mild LAE.   On 11/11/2018, he was seen by outpatient oncology with the patient noting that he felt poorly and weak; however, due to his aphasia, he could not elaborate any further.  During this visit, he was noted to be tachycardic with hypotension and concern for atrial fibrillation with RVR; therefore, urgent evaluation in the emergency room was recommended. In the ED, he denied CP but did report SOB and fatigue (patient was non-verbal but occasionally responded by shaking his head in response to questions). Family was in the room at the time but not able to contribute much more to the history other than to  report that the patient had recently been grieving a loss of someone close to him. In the ED, vitals were significant for tachycardic rate into the 120s.  EKG was unchanged from previous EKGs and sinus tachycardia, LAFB, frequent ectopy, poor R wave progression, underlying tremor, and no acute ST/T changes. Additional EKG performed  showed a run of SVT, consistent with his history as above. Telemetry showed frequent ectopy including PVCs / ventricular bigeminy and trigeminy and short runs of SVT. No atrial fibrillation was seen on EKG or telemetry. CXR showed chronic elevation of the right diaphragm and no active disease. Labs showed troponin 0.05, BNP 472.0, K 4.0., Cr 1.65, BUN 33, AST 21, ALT 12, lactic acid 1.9, WBC 7.7, Hgb 10.0, RBC 3.34. COVID-19 negative.   Past Medical History:  Diagnosis Date   Anxiety    Aortic regurgitation    a. 03/2017 Echo: Mod AI; b. 05/2017 Echo: Triv AI.   Arthritis    osteoarthritis-left knee   CAD (coronary artery disease)    a. Reported h/o cath ~ 2008 - ? small vessel dzs. No PCI performed; b. 03/2017 MV: EF 45-54%, no ischemia/infarct.   Depression    Diabetes mellitus without complication ([place])    Diastolic dysfunction    a. 03/2017 Echo: EF 55-65%; b. 05/2017 Echo: EF 55-60%, no rwma, Gr1 DD. Triv AI.  Mildly dil LA.   ED (erectile dysfunction)    Elevated troponin    a. chronic mild elevation - 0.03->0.04.   GERD (gastroesophageal reflux disease)    Glaucoma    Hyperlipidemia    Hypertension    Iron deficiency anemia due to chronic blood loss    Left pontine stroke w/ cerebrovascular disease(HCC)    a. 05/2017 MRI/A: acute to subacute L pontine infarct. Chronic left thalamic lacunar infarct. Severe basilar artery stenosis w/ radiographic string sign of the distal 1/3. Mild to moderate L PCA P2 segment stenosis; b. 05/2017 Carotid U/S: <50% bilat ICA stenosis.   Lumbago    Lumbosacral Neuritis   Prostate cancer [facility])     Past Surgical History:  Procedure Laterality Date   CARPAL TUNNEL RELEASE     REPLACEMENT TOTAL KNEE BILATERAL       Home Medications:  Prior to Admission medications   Medication Sig Start Date End Date Taking? Authorizing Provider  acetaminophen (TYLENOL 8 HOUR ARTHRITIS PAIN) 650 MG CR tablet Take 650 mg by mouth every 8  (eight) hours as needed for pain.   Yes [provider]  aspirin EC 81 MG tablet Take 81 mg by mouth daily.   Yes [provider]  atorvastatin (LIPITOR) 10 MG tablet Take 1 tablet (10 mg total) by mouth daily. 07/07/18  Yes Crissman, Jeannette How, MD  clopidogrel (PLAVIX) 75 MG tablet Take 1 tablet (75 mg total) by mouth daily. 12/20/17  Yes Todd Maple, MD  cyanocobalamin (,VITAMIN B-12,) 1000 MCG/ML injection 109m weekly x4 weeks, then 113mmonthly after that 05/07/18  Yes Crissman, MaJeannette HowMD  DULoxetine (CYMBALTA) 60 MG capsule Take 1 capsule (60 mg total) by mouth daily. 04/25/18  Yes Johnson, Megan P, DO  furosemide (LASIX) 20 MG tablet Take 1 tablet (20 mg total) by mouth daily. 10/24/18  Yes Crissman, MaJeannette HowMD  gabapentin (NEURONTIN) 100 MG capsule Take 100 mg by mouth 2 (two) times daily. Once a day at night   Yes [provider]  hydrALAZINE (APRESOLINE) 25 MG tablet Take 1 tablet (25 mg total) by mouth 3 (  three) times daily. 09/29/18  Yes Crissman, Jeannette How, MD  hydrochlorothiazide (HYDRODIURIL) 25 MG tablet Take 1 tablet (25 mg total) by mouth daily. 12/20/17  Yes Crissman, Jeannette How, MD  latanoprost (XALATAN) 0.005 % ophthalmic solution Place 1 drop into both eyes at bedtime.  06/08/16  Yes [provider]  magnesium oxide (MAG-OX) 400 MG tablet Take 1 tablet (400 mg total) by mouth 2 (two) times daily. 05/10/18  Yes Dunn, Areta Haber, PA-C  metFORMIN (GLUCOPHAGE) 500 MG tablet Take 1 tablet (500 mg total) by mouth daily with breakfast. 12/20/17  Yes Crissman, Jeannette How, MD  naproxen sodium (ALEVE) 220 MG tablet Take 220 mg by mouth 2 (two) times daily as needed.    Yes [provider]  pantoprazole (PROTONIX) 40 MG tablet Take 1 tablet (40 mg total) by mouth daily. 08/27/18  Yes Gollan, Kathlene November, MD  propranolol ER (INDERAL LA) 120 MG 24 hr capsule Take 1 capsule (120 mg total) by mouth daily. 05/20/18  Yes Dunn, Areta Haber, PA-C  telmisartan (MICARDIS) 80 MG tablet  Take 1 tablet (80 mg total) by mouth daily. 12/20/17  Yes Crissman, Jeannette How, MD  Blood Glucose Monitoring Suppl (ACCU-CHEK AVIVA PLUS) w/Device KIT Use to check sugar levels x 2 daily 06/13/17   Todd Maple, MD  food thickener (RESOURCE THICKENUP CLEAR) POWD Follow instructions on package for nectar consistency in all liquids 04/25/18   Park Liter P, DO  Lancets (ACCU-CHEK SOFT TOUCH) lancets Use as instructed 06/13/17   Todd Maple, MD    Inpatient Medications: Scheduled Meds:  cyanocobalamin  1,000 mcg Intramuscular Q30 days   cyanocobalamin  1,000 mcg Intramuscular Q30 days   Continuous Infusions:  diltiazem (CARDIZEM) infusion 5 mg/hr (11/11/18 1412)   PRN Meds:   Allergies:    Allergies  Allergen Reactions   Amlodipine     Lower extremity swelling at 10 mg.   Fluoxetine Other (See Comments)    headache   Meloxicam Other (See Comments)    Other reaction(s): Dizziness Nervousness.     Social History:   Social History   Socioeconomic History   Marital status: Widowed    Spouse name: Not on file   Number of children: Not on file   Years of education: Not on file   Highest education level: Not on file  Occupational History   Occupation: retired  Scientist, product/process development strain: Not hard at [employer] insecurity:    Worry: Never true    Inability: Never true   Transportation needs:    Medical: No    Non-medical: No  Tobacco Use   Smoking status: Former Smoker    Last attempt to quit: 12/02/1994    Years since quitting: 23.9   Smokeless tobacco: Never Used  Substance and Sexual Activity   Alcohol use: Yes    Alcohol/week: 1.0 standard drinks    Types: 1 Cans of beer per week    Comment: occasional   Drug use: No   Sexual activity: Never  Lifestyle   Physical activity:    Days per week: 0 days    Minutes per session: 0 min   Stress: Not at all  Relationships   Social connections:    Talks on phone: Not on file     Gets together: Not on file    Attends religious service: Not on file    Active member of club or organization: Not on file    Attends  meetings of clubs or organizations: Not on file    Relationship status: Widowed   Intimate partner violence:    Fear of current or ex partner: No    Emotionally abused: No    Physically abused: No    Forced sexual activity: No  Other Topics Concern   Not on file  Social History Narrative   Lives at home with family.  Able to ambulate with a walker.    Family History:    Family History  Problem Relation Age of Onset   Stroke Mother    Hypertension Mother    Hypertension Father    Stroke Sister    Prostate cancer Neg Hx    Kidney cancer Neg Hx    Bladder Cancer Neg Hx      ROS:  Please see the history of present illness.  Review of Systems  Unable to perform ROS: Patient nonverbal  Constitutional: Positive for malaise/fatigue.  Respiratory: Positive for shortness of breath.   All other systems reviewed and are negative.   All other ROS reviewed and negative.     Physical Exam/Data:   Vitals:   11/11/18 1415 11/11/18 1430 11/11/18 1445 11/11/18 1500  BP:  106/77  99/62  Pulse:   (!) 49   Resp: _0 Temp:      TempSrc:      SpO2:   100%    No intake or output data in the 24 hours ending 11/11/18 1540 There were no vitals filed for this visit. There is no height or weight on file to calculate BMI.  General:  Well nourished, well developed, in no acute distress HEENT: normal. On home oxygen. Neck: no JVD Endocrine:  No thryomegaly Vascular: No carotid bruits; FA pulses 2+ bilaterally without bruits  Cardiac:  normal S1, S2; tachycardic with frequent extrasystole, no murmur  Lungs:  diminished breath sounds bilaterally, no wheezing, rhonchi or rales  Abd: Firm, nontender, no hepatomegaly  Ext: minimal bilateral lower extremity edema Musculoskeletal:  No deformities, BUE and BLE strength normal and equal Skin: warm  and dry  Neuro:  Nonverbal / aphasia. Will occasionally nod and shake head with questions. Occasional responses of yes and no. Psych:  Normal affect   EKG:  The EKG was personally reviewed and demonstrates:  Sinus tachycardia with frequent ectopy, LAFB, poor R wave progression Telemetry:  Telemetry was personally reviewed and demonstrates: ST, SVT, PVC, PACs  Relevant CV Studies: TTE 06/03/2018 - Left ventricle: The cavity size was normal. There was moderate   concentric hypertrophy. Systolic function was normal. The   estimated ejection fraction was in the range of 60% to 65%. Wall   motion was normal; there were no regional wall motion   abnormalities. Doppler parameters are consistent with abnormal   left ventricular relaxation (grade 1 diastolic dysfunction). - Aortic valve: There was trivial regurgitation. - Aortic root: The aortic root was mildly dilated, 3.6 cm - Left atrium: The atrium was mildly dilated. - Right ventricle: Systolic function was normal. - Pulmonary arteries: Systolic pressure was within the normal   range.  Laboratory Data:  Chemistry Recent Labs  Lab 11/11/18 1130  NA 139  K 4.0  CL 99  CO2 28  GLUCOSE 347*  BUN 33*  CREATININE 1.65*  CALCIUM 9.4  GFRNONAA 37*  GFRAA 43*  ANIONGAP 12    Recent Labs  Lab 11/11/18 1130  PROT 7.3  ALBUMIN 4.1  AST 21  ALT 12  ALKPHOS 67  BILITOT 1.1   Hematology Recent Labs  Lab 11/11/18 1130  WBC 7.7  RBC 3.34*  HGB 10.0*  HCT 32.6*  MCV 97.6  MCH 29.9  MCHC 30.7  RDW 13.0  PLT 220   Cardiac Enzymes Recent Labs  Lab 11/11/18 1256  TROPONINI 0.05*   No results for input(s): TROPIPOC in the last 168 hours.  BNP Recent Labs  Lab 11/11/18 1130  BNP 472.0*    DDimer No results for input(s): DDIMER in the last 168 hours.  Radiology/Studies:  Ct Abdomen Pelvis Wo Contrast  Result Date: 11/11/2018 CLINICAL DATA:  Tachycardia and weakness EXAM: CT ABDOMEN AND PELVIS WITHOUT CONTRAST  TECHNIQUE: Multidetector CT imaging of the abdomen and pelvis was performed following the standard protocol without IV contrast. COMPARISON:  None. FINDINGS: Lower chest: Lung bases demonstrate no sizable infiltrate or effusion. Some scarring in the medial right lower lobe is noted. Hepatobiliary: Gallbladder is well distended. Some faint calcifications are noted along the inferior margin of the gallbladder which may represent adherent stones. The liver demonstrates a few calcified granulomas. Pancreas: Unremarkable. No pancreatic ductal dilatation or surrounding inflammatory changes. Spleen: Normal in size without focal abnormality. Adrenals/Urinary Tract: Adrenal glands are within normal limits. Scattered hypodensities are noted throughout both kidneys consistent with cysts but incompletely evaluated on today's exam. Nonobstructing renal stones are seen bilaterally measuring less than 5 mm. The bladder is well distended. Stomach/Bowel: Appendix is not well visualized although no inflammatory changes to suggest appendicitis are noted. No obstructive or inflammatory changes of the bowel are noted. Vascular/Lymphatic: Aortic atherosclerosis. No enlarged abdominal or pelvic lymph nodes. Reproductive: Multiple brachytherapy seeds are noted within the prostate Other: No abdominal wall hernia or abnormality. No abdominopelvic ascites. Musculoskeletal: Degenerative changes of the lumbar spine are noted. IMPRESSION: Nonobstructing renal stones bilaterally. Small faintly calcified gallstones. No acute abnormality is noted. Electronically Signed   By: Inez Catalina M.D.   On: 11/11/2018 13:59   Dg Chest Portable 1 View  Result Date: 11/11/2018 CLINICAL DATA:  Generalized weakness, tachycardia EXAM: PORTABLE CHEST 1 VIEW COMPARISON:  12/17/2017, 09/18/2018 FINDINGS: There is chronic elevation of the right diaphragm. There is no focal parenchymal opacity. There is no pleural effusion or pneumothorax. There is stable  cardiomegaly. There is severe bilateral glenohumeral osteoarthritis. IMPRESSION: No active disease. Electronically Signed   By: Kathreen Devoid   On: 11/11/2018 13:31    Assessment and Plan:   Frequent PVCs - Unable to full communicate symptoms but does note SOB. - Outpatient monitor as above showing SR, frequent ectopy, short runs of SVT. - No evidence of atrial fibrillation.  - Minimal troponin elevation likely in the setting of elevated HR. - Most recent echo as above with EF 60-65%, G1DD, trivial Ai, dilated aortic root. - PTA propanolol held and started on diltiazem drip.  - Recommend wean Cardizem drip as tolerated and restart PTA BB.  - Continue close monitoring of BP given his hypotension. - Not an ideal candidate for ischemic evaluation as below and given his underlying comorbid conditions with patient non-verbal on exam.  CAD - No chest pain indicated by patient - Continue aggressive risk factor modification - Continue PTA medical management  History of stroke - Followed by neurology and remains on DAPT per neurology.  Anemia - Per PCP  Prostate CA, Hypogonadism - Per PCP, IM    For questions or updates, please contact [facility] Please consult [email or web address] for contact info under     Signed, Arvil Chaco,  PA-C  11/11/2018 3:40 PM

## 2018-11-11 NOTE — Progress Notes (Signed)
Unable to reconcile medications. rn can not reach family.  ekg performed patient tachycardic- irregular heart beat 125-158. Pt has h/o aphasia. Unable to communicate to RN verbally. Will nod in conversations.

## 2018-11-11 NOTE — ED Notes (Signed)
Pt given mouth swabs, family given drink.

## 2018-11-11 NOTE — ED Notes (Signed)
Pt requesting water. Daughter reports drinks liquids at home but is supposed to be on thickened liquids but they give him thin because he doesn't like the thick. Unsure if honey or nectar thick.  Dr Ellender Hose notified and pt to remain NPO at this time.

## 2018-11-11 NOTE — Telephone Encounter (Signed)
EKG reviewed-junctional tachycardia versus A. Fib-1 20-1 30 heart rate.   Unable to reach normal.  Try to reach Britt Boozer the patient to the cancer center ]eft message at [phone]-call us back  Spoke to gretta-given her father's elevated heart rate recommend evaluation in the emergency room.

## 2018-11-11 NOTE — ED Notes (Addendum)
Increased fatigue and weakness over last few days per family. Pt lives with son and daughter-in-law.  No pain reported.  Was at [facility] center for appointment and found to be tachy with decreased BP.

## 2018-11-11 NOTE — Progress Notes (Signed)
[facility] NOTE  Patient Care Team: Guadalupe Maple, MD as PCP - General (Family Medicine) Minna Merritts, MD as PCP - Cardiology (Cardiology) Collier Flowers, MD as Referring Physician (Urology) Birder Robson, MD as Referring Physician (Ophthalmology) Reche Dixon, PA-C (Orthopedic Surgery) Vladimir Crofts, MD (Neurology)  CHIEF COMPLAINTS/PURPOSE OF CONSULTATION: Anemia  #  Oncology History   # CHRONIC ANEMIA-since 2016; June 2019 worsened- 9-10; Iron sat- 27%; NOT IDA/ no hemolysis; monoclonal work-up negative; B12 normal slightly low seen.  Question MDS-reluctant bone marrow biopsy  # Nov 2019- RLL- ? Infiltrate; repeat in 3 months.   # PROSTATE CANCER [>20 years]  # DEC 2018- stroke/Aphasia; Chronic CHF on 2-3 lits/O2.      Prostate cancer ([place])     HISTORY OF PRESENTING ILLNESS: Patient is alone/unable to get history because of his aphasia.  No family available. Todd [name] 85 y.o.  male with a history of stroke/aphasia; and also history of chronic anemia for the last 1 year or so is here for follow-up.   Patient nods stating that he feels poorly.  He cannot elaborate any further.  Review of Systems  Unable to perform ROS: Patient nonverbal     MEDICAL HISTORY:  Past Medical History:  Diagnosis Date  . Anxiety   . Aortic regurgitation    a. 03/2017 Echo: Mod AI; b. 05/2017 Echo: Triv AI.  [name] Kitchen Arthritis    osteoarthritis-left knee  . CAD (coronary artery disease)    a. Reported h/o cath ~ 2008 - ? small vessel dzs. No PCI performed; b. 03/2017 MV: EF 45-54%, no ischemia/infarct.  . Depression   . Diabetes mellitus without complication ([place])   . Diastolic dysfunction    a. 03/2017 Echo: EF 55-65%; b. 05/2017 Echo: EF 55-60%, no rwma, Gr1 DD. Triv AI.  Mildly dil LA.  . ED (erectile dysfunction)   . Elevated troponin    a. chronic mild elevation - 0.03->0.04.  [name] Kitchen GERD (gastroesophageal reflux disease)   . Glaucoma   . Hyperlipidemia    . Hypertension   . Iron deficiency anemia due to chronic blood loss   . Left pontine stroke w/ cerebrovascular disease([facility])    a. 05/2017 MRI/A: acute to subacute L pontine infarct. Chronic left thalamic lacunar infarct. Severe basilar artery stenosis w/ radiographic string sign of the distal 1/3. Mild to moderate L PCA P2 segment stenosis; b. 05/2017 Carotid U/S: <50% bilat ICA stenosis.  . Lumbago    Lumbosacral Neuritis  . Prostate cancer [facility])     SURGICAL HISTORY: Past Surgical History:  Procedure Laterality Date  . CARPAL TUNNEL RELEASE    . REPLACEMENT TOTAL KNEE BILATERAL      SOCIAL HISTORY: Social History   Socioeconomic History  . Marital status: Widowed    Spouse name: Not on file  . Number of children: Not on file  . Years of education: Not on file  . Highest education level: Not on file  Occupational History  . Occupation: retired  Scientific laboratory technician  . Financial resource strain: Not hard at all  . Food insecurity:    Worry: Never true    Inability: Never true  . Transportation needs:    Medical: No    Non-medical: No  Tobacco Use  . Smoking status: Former Smoker    Last attempt to quit: 12/02/1994    Years since quitting: 23.9  . Smokeless tobacco: Never Used  Substance and Sexual Activity  . Alcohol use: Yes  Alcohol/week: 1.0 standard drinks    Types: 1 Cans of beer per week    Comment: occasional  . Drug use: No  . Sexual activity: Never  Lifestyle  . Physical activity:    Days per week: 0 days    Minutes per session: 0 min  . Stress: Not at all  Relationships  . Social connections:    Talks on phone: Not on file    Gets together: Not on file    Attends religious service: Not on file    Active member of club or organization: Not on file    Attends meetings of clubs or organizations: Not on file    Relationship status: Widowed  . Intimate partner violence:    Fear of current or ex partner: No    Emotionally abused: No    Physically abused:  No    Forced sexual activity: No  Other Topics Concern  . Not on file  Social History Narrative   Lives at home with family.  Able to ambulate with a walker.    FAMILY HISTORY: Family History  Problem Relation Age of Onset  . Stroke Mother   . Hypertension Mother   . Hypertension Father   . Stroke Sister   . Prostate cancer Neg Hx   . Kidney cancer Neg Hx   . Bladder Cancer Neg Hx     ALLERGIES:  is allergic to amlodipine; fluoxetine; and meloxicam.  MEDICATIONS:  Current Outpatient Medications  Medication Sig Dispense Refill  . acetaminophen (TYLENOL 8 HOUR ARTHRITIS PAIN) 650 MG CR tablet Take 650 mg by mouth every 8 (eight) hours as needed for pain.    [name] Kitchen aspirin EC 81 MG tablet Take 81 mg by mouth daily.    [name] Kitchen atorvastatin (LIPITOR) 10 MG tablet Take 1 tablet (10 mg total) by mouth daily. 90 tablet 1  . Blood Glucose Monitoring Suppl (ACCU-CHEK AVIVA PLUS) w/Device KIT Use to check sugar levels x 2 daily 1 kit 0  . clopidogrel (PLAVIX) 75 MG tablet Take 1 tablet (75 mg total) by mouth daily. 90 tablet 4  . cyanocobalamin (,VITAMIN B-12,) 1000 MCG/ML injection 4m weekly x4 weeks, then 165mmonthly after that 10 mL 12  . DULoxetine (CYMBALTA) 60 MG capsule Take 1 capsule (60 mg total) by mouth daily. 90 capsule 1  . food thickener (RESOURCE THICKENUP CLEAR) POWD Follow instructions on package for nectar consistency in all liquids 1000 g 12  . furosemide (LASIX) 20 MG tablet Take 1 tablet (20 mg total) by mouth daily. 30 tablet 4  . gabapentin (NEURONTIN) 100 MG capsule Take 100 mg by mouth 2 (two) times daily. Once a day at night    . hydrALAZINE (APRESOLINE) 25 MG tablet Take 1 tablet (25 mg total) by mouth 3 (three) times daily. 90 tablet 0  . hydrochlorothiazide (HYDRODIURIL) 25 MG tablet Take 1 tablet (25 mg total) by mouth daily. 90 tablet 4  . Lancets (ACCU-CHEK SOFT TOUCH) lancets Use as instructed 100 each 12  . latanoprost (XALATAN) 0.005 % ophthalmic solution Place 1  drop into both eyes at bedtime.     . magnesium oxide (MAG-OX) 400 MG tablet Take 1 tablet (400 mg total) by mouth 2 (two) times daily. 60 tablet 6  . metFORMIN (GLUCOPHAGE) 500 MG tablet Take 1 tablet (500 mg total) by mouth daily with breakfast. 180 tablet 4  . naproxen sodium (ALEVE) 220 MG tablet Take 220 mg by mouth 2 (two) times daily as needed.     .[name] Kitchen  pantoprazole (PROTONIX) 40 MG tablet Take 1 tablet (40 mg total) by mouth daily. 90 tablet 0  . propranolol ER (INDERAL LA) 120 MG 24 hr capsule Take 1 capsule (120 mg total) by mouth daily. 90 capsule 3  . telmisartan (MICARDIS) 80 MG tablet Take 1 tablet (80 mg total) by mouth daily. 90 tablet 4   Current Facility-Administered Medications  Medication Dose Route Frequency Provider Last Rate Last Dose  . cyanocobalamin ((VITAMIN B-12)) injection 1,000 mcg  1,000 mcg Intramuscular Q30 days Guadalupe Maple, MD   1,000 mcg at 07/24/18 1435  . cyanocobalamin ((VITAMIN B-12)) injection 1,000 mcg  1,000 mcg Intramuscular Q30 days Volney American, PA-C           .  PHYSICAL EXAMINATION: ECOG PERFORMANCE STATUS: 1 - Symptomatic but completely ambulatory  Vitals:   11/11/18 1155  BP: (!) 88/62  Pulse: (!) 125  Resp: (!) 22  Temp: (!) 95.7 F (35.4 C)   Filed Weights   11/11/18 1155  Weight: 199 lb (90.3 kg)    Physical Exam  Constitutional: He is well-developed, well-nourished, and in no distress.  Elderly male patient.  He is alone.  He is in a wheelchair.  HENT:  Head: Normocephalic and atraumatic.  Mouth/Throat: Oropharynx is clear and moist. No oropharyngeal exudate.  Eyes: Pupils are equal, round, and reactive to light.  Neck: Normal range of motion. Neck supple.  Cardiovascular:  Tachycardic irregularly irregular rhythm.  Pulmonary/Chest: No respiratory distress. He has no wheezes.  Decreased air entry bilaterally.  Abdominal: Soft. Bowel sounds are normal. He exhibits no distension and no mass. There is no  abdominal tenderness. There is no rebound and no guarding.  Musculoskeletal: Normal range of motion.        General: No tenderness or edema.  Neurological: He is alert.  Chronic weakness of the right upper and lower extremities; chronic tremor of the left upper extremity.   Aphasic; unable to assess orientation  Skin: Skin is warm.     LABORATORY DATA:  I have reviewed the data as listed Lab Results  Component Value Date   WBC 7.7 11/11/2018   HGB 10.0 (L) 11/11/2018   HCT 32.6 (L) 11/11/2018   MCV 97.6 11/11/2018   PLT 220 11/11/2018   Recent Labs    05/09/18 1511 05/17/18 1625  09/02/18 1349 10/02/18 1343 11/11/18 1130  NA 145* 142   < > 139 138 139  K 4.3 4.3   < > 4.2 4.2 4.0  CL 101 104   < > 103 98 99  CO2 28 30   < > _0 GLUCOSE 175* 159*   < > 238* 256* 347*  BUN 15 23   < > 26* 29* 33*  CREATININE 1.06 1.17   < > 1.37* 1.41* 1.65*  CALCIUM 9.3 9.5   < > 8.9 9.4 9.4  GFRNONAA 64 57*   < > 47* 45* 37*  GFRAA 74 >60   < > 54* 52* 43*  PROT 6.5 7.2  --  6.9  --   --   ALBUMIN 4.1 4.2  --  3.8  --   --   AST 24 23  --  21  --   --   ALT 16 15  --  19  --   --   ALKPHOS 78 78  --  71  --   --   BILITOT 0.4 0.6  --  0.8  --   --    < > =  values in this interval not displayed.    RADIOGRAPHIC STUDIES: I have personally reviewed the radiological images as listed and agreed with the findings in the report. No results found.  ASSESSMENT & PLAN:   Normocytic anemia #Normocytic anemia hemoglobin around ~10 today.  MDS/CKD.  No bone marrow biopsy.  Hold Aranesp as hemoglobin is 10.  #Tachycardia irregular rhythm-EKG independently reviewed-A. fib with RVR versus junctional tachycardia; blood pressure systolic [id number] urgent evaluation in the emergency room.  # Chronic respiratory failure/CHF on 3 L of oxygen.  Stable  # renal insufficiency: Today creatinine 1.6 slightly worse than baseline.  Question secondary diuretics.  # Stroke- right  sided/aphasia-chronic.  Stable  #Right lower lobe infiltrative lesion- [November 2019]-chest x-ray April 2020-mild fluid congestion no worsening infiltrate.  #I called the daughter Ivory Broad updated her of her father's clinical status/evaluation of the emergency room.  Unable to reach Asa Saunas brought the patient to the cancer center]; unable to reach other daughter [name].  # DISPOSITION: #Recommend urgent evaluation in the emergency room.   All questions were answered. The patient knows to call the clinic with any problems, questions or concerns.    Cammie Sickle, MD 11/11/2018 12:31 PM

## 2018-11-11 NOTE — ED Notes (Addendum)
ED TO INPATIENT HANDOFF REPORT  ED Nurse Name and Phone #: Janett Billow [phone]   S Name/Age/Gender Todd [name] 85 y.o. male Room/Bed: ED19A/ED19A  Code Status   Code Status: Prior  Home/SNF/Other Home Patient oriented to: self, place and time Is this baseline? Yes   Triage Complete: Triage complete  Chief Complaint A fib/brought from [facility]  Triage Note PT from [facility] with c/o tachycardia. PT nonverbal, hx of stroke. PT family at bedside, states c/o weakness. PT on o2 chronic. MD at bedside    Allergies Allergies  Allergen Reactions  . Amlodipine     Lower extremity swelling at 10 mg.  . Fluoxetine Other (See Comments)    headache  . Meloxicam Other (See Comments)    Other reaction(s): Dizziness Nervousness.     Level of Care/Admitting Diagnosis ED Disposition    ED Disposition Condition [facility] Area: [place] [[id number]]  Level of Care: Telemetry [5]  Covid Evaluation: Confirmed COVID Negative  Diagnosis: A-fib [facility]) [[id number]]  Admitting Physician: Gorden Harms [[id number]]  Attending Physician: Gorden Harms [[id number]]  Estimated length of stay: past midnight tomorrow  Certification:: I certify this patient will need inpatient services for at least 2 midnights  PT Class (Do Not Modify): Inpatient [101]  PT Acc Code (Do Not Modify): Private [1]       B Medical/Surgery History Past Medical History:  Diagnosis Date  . Anxiety   . Aortic regurgitation    a. 03/2017 Echo: Mod AI; b. 05/2017 Echo: Triv AI.  [name] Kitchen Arthritis    osteoarthritis-left knee  . CAD (coronary artery disease)    a. Reported h/o cath ~ 2008 - ? small vessel dzs. No PCI performed; b. 03/2017 MV: EF 45-54%, no ischemia/infarct.  . Depression   . Diabetes mellitus without complication ([place])   . Diastolic dysfunction    a. 03/2017 Echo: EF 55-65%; b. 05/2017 Echo: EF 55-60%, no rwma, Gr1 DD. Triv AI.  Mildly dil LA.  . ED (erectile dysfunction)    . Elevated troponin    a. chronic mild elevation - 0.03->0.04.  [name] Kitchen GERD (gastroesophageal reflux disease)   . Glaucoma   . Hyperlipidemia   . Hypertension   . Iron deficiency anemia due to chronic blood loss   . Left pontine stroke w/ cerebrovascular disease([facility])    a. 05/2017 MRI/A: acute to subacute L pontine infarct. Chronic left thalamic lacunar infarct. Severe basilar artery stenosis w/ radiographic string sign of the distal 1/3. Mild to moderate L PCA P2 segment stenosis; b. 05/2017 Carotid U/S: <50% bilat ICA stenosis.  . Lumbago    Lumbosacral Neuritis  . Prostate cancer [facility])    Past Surgical History:  Procedure Laterality Date  . CARPAL TUNNEL RELEASE    . REPLACEMENT TOTAL KNEE BILATERAL       A IV Location/Drains/Wounds Patient Lines/Drains/Airways Status   Active Line/Drains/Airways    Name:   Placement date:   Placement time:   Site:   Days:   Peripheral IV 11/11/18 Left Antecubital   11/11/18    1254    Antecubital   less than 1   Peripheral IV 11/11/18 Right Hand   11/11/18    1301    Hand   less than 1          Intake/Output Last 24 hours  Intake/Output Summary (Last 24 hours) at 11/11/2018 1710 Last data filed at 11/11/2018 1624 Gross per 24 hour  Intake 21.45 ml  Output -  Net 21.45 ml    Labs/Imaging Results for orders placed or performed during the hospital encounter of 11/11/18 (from the past 48 hour(s))  Hepatic function panel     Status: None   Collection Time: 11/11/18 11:30 AM  Result Value Ref Range   Total Protein 7.3 6.5 - 8.1 g/dL   Albumin 4.1 3.5 - 5.0 g/dL   AST 21 15 - 41 U/L   ALT 12 0 - 44 U/L   Alkaline Phosphatase 67 38 - 126 U/L   Total Bilirubin 1.1 0.3 - 1.2 mg/dL   Bilirubin, Direct 0.2 0.0 - 0.2 mg/dL   Indirect Bilirubin 0.9 0.3 - 0.9 mg/dL    Comment: Performed at [facility], [address] [place], [place] [address]  Troponin I - ONCE - STAT     Status: Abnormal   Collection Time: 11/11/18 12:56 PM   Result Value Ref Range   Troponin I 0.05 (HH) <0.03 ng/mL    Comment: CRITICAL RESULT CALLED TO, READ BACK BY AND VERIFIED WITH  Feather Berrie AT 8338 11/11/2018 SDR Performed at [facility], [address] [place], [place] [address]   Protime-INR     Status: None   Collection Time: 11/11/18 12:56 PM  Result Value Ref Range   Prothrombin Time 13.4 11.4 - 15.2 seconds   INR 1.0 0.8 - 1.2    Comment: (NOTE) INR goal varies based on device and disease states. Performed at [facility], [address] [place], [place] [address]   Lactic acid, plasma     Status: None   Collection Time: 11/11/18 12:56 PM  Result Value Ref Range   Lactic Acid, Venous 1.9 0.5 - 1.9 mmol/L    Comment: Performed at [facility], [address], [place], [place] [address]  SARS Coronavirus 2 (CEPHEID - Performed in [place] hospital lab), Hosp Order     Status: None   Collection Time: 11/11/18 12:56 PM  Result Value Ref Range   SARS Coronavirus 2 NEGATIVE NEGATIVE    Comment: (NOTE) If result is NEGATIVE SARS-CoV-2 target nucleic acids are NOT DETECTED. The SARS-CoV-2 RNA is generally detectable in upper and lower  respiratory specimens during the acute phase of infection. The lowest  concentration of SARS-CoV-2 viral copies this assay can detect is 250  copies / mL. A negative result does not preclude SARS-CoV-2 infection  and should not be used as the sole basis for treatment or other  patient management decisions.  A negative result may occur with  improper specimen collection / handling, submission of specimen other  than nasopharyngeal swab, presence of viral mutation(s) within the  areas targeted by this assay, and inadequate number of viral copies  (<250 copies / mL). A negative result must be combined with clinical  observations, patient history, and epidemiological information. If result is POSITIVE SARS-CoV-2 target nucleic acids are DETECTED. The  SARS-CoV-2 RNA is generally detectable in upper and lower  respiratory specimens dur ing the acute phase of infection.  Positive  results are indicative of active infection with SARS-CoV-2.  Clinical  correlation with patient history and other diagnostic information is  necessary to determine patient infection status.  Positive results do  not rule out bacterial infection or co-infection with other viruses. If result is PRESUMPTIVE POSTIVE SARS-CoV-2 nucleic acids MAY BE PRESENT.   A presumptive positive result was obtained on the submitted specimen  and confirmed on repeat testing.  While 2019 novel coronavirus  (  SARS-CoV-2) nucleic acids may be present in the submitted sample  additional confirmatory testing may be necessary for epidemiological  and / or clinical management purposes  to differentiate between  SARS-CoV-2 and other Sarbecovirus currently known to infect humans.  If clinically indicated additional testing with an alternate test  methodology [phone]) is advised. The SARS-CoV-2 RNA is generally  detectable in upper and lower respiratory sp ecimens during the acute  phase of infection. The expected result is Negative. Fact Sheet for Patients:  StrictlyIdeas.no Fact Sheet for Healthcare Providers: BankingDealers.co.za This test is not yet approved or cleared by the [place] FDA and has been authorized for detection and/or diagnosis of SARS-CoV-2 by FDA under an Emergency Use Authorization (EUA).  This EUA will remain in effect (meaning this test can be used) for the duration of the COVID-19 declaration under Section 564(b)(1) of the Act, 21 U.S.C. section 360bbb-3(b)(1), unless the authorization is terminated or revoked sooner. Performed at [facility], [address] [place], [place] [address]   TSH     Status: None   Collection Time: 11/11/18 12:56 PM  Result Value Ref Range   TSH 0.936 0.350 - 4.500  uIU/mL    Comment: Performed by a 3rd Generation assay with a functional sensitivity of <=0.01 uIU/mL. Performed at [facility], [place], [place] [address]    Ct Abdomen Pelvis Wo Contrast  Result Date: 11/11/2018 CLINICAL DATA:  Tachycardia and weakness EXAM: CT ABDOMEN AND PELVIS WITHOUT CONTRAST TECHNIQUE: Multidetector CT imaging of the abdomen and pelvis was performed following the standard protocol without IV contrast. COMPARISON:  None. FINDINGS: Lower chest: Lung bases demonstrate no sizable infiltrate or effusion. Some scarring in the medial right lower lobe is noted. Hepatobiliary: Gallbladder is well distended. Some faint calcifications are noted along the inferior margin of the gallbladder which may represent adherent stones. The liver demonstrates a few calcified granulomas. Pancreas: Unremarkable. No pancreatic ductal dilatation or surrounding inflammatory changes. Spleen: Normal in size without focal abnormality. Adrenals/Urinary Tract: Adrenal glands are within normal limits. Scattered hypodensities are noted throughout both kidneys consistent with cysts but incompletely evaluated on today's exam. Nonobstructing renal stones are seen bilaterally measuring less than 5 mm. The bladder is well distended. Stomach/Bowel: Appendix is not well visualized although no inflammatory changes to suggest appendicitis are noted. No obstructive or inflammatory changes of the bowel are noted. Vascular/Lymphatic: Aortic atherosclerosis. No enlarged abdominal or pelvic lymph nodes. Reproductive: Multiple brachytherapy seeds are noted within the prostate Other: No abdominal wall hernia or abnormality. No abdominopelvic ascites. Musculoskeletal: Degenerative changes of the lumbar spine are noted. IMPRESSION: Nonobstructing renal stones bilaterally. Small faintly calcified gallstones. No acute abnormality is noted. Electronically Signed   By: Inez Catalina M.D.   On: 11/11/2018 13:59   Dg  Chest Portable 1 View  Result Date: 11/11/2018 CLINICAL DATA:  Generalized weakness, tachycardia EXAM: PORTABLE CHEST 1 VIEW COMPARISON:  12/17/2017, 09/18/2018 FINDINGS: There is chronic elevation of the right diaphragm. There is no focal parenchymal opacity. There is no pleural effusion or pneumothorax. There is stable cardiomegaly. There is severe bilateral glenohumeral osteoarthritis. IMPRESSION: No active disease. Electronically Signed   By: Kathreen Devoid   On: 11/11/2018 13:31    Pending Labs Unresulted Labs (From admission, onward)    Start     Ordered   11/12/18 0500  CBC  Tomorrow morning,   STAT     11/11/18 1658   11/11/18 1650  Troponin I - Now Then  Q6H  Now then every 6 hours,   STAT     11/11/18 1649   11/11/18 1256  Blood culture (routine x 2)  BLOOD CULTURE X 2,   STAT     11/11/18 1255   Signed and Held  CBC  (enoxaparin (LOVENOX)    CrCl >/= 30 ml/min)  Once,   R    Comments:  Baseline for enoxaparin therapy IF NOT ALREADY DRAWN.  Notify MD if PLT < 100 K.    Signed and Held   Signed and Held  Creatinine, serum  (enoxaparin (LOVENOX)    CrCl >/= 30 ml/min)  Once,   R    Comments:  Baseline for enoxaparin therapy IF NOT ALREADY DRAWN.    Signed and Held   Signed and Held  Creatinine, serum  (enoxaparin (LOVENOX)    CrCl >/= 30 ml/min)  Weekly,   R    Comments:  while on enoxaparin therapy    Signed and Held   Signed and Held  Basic metabolic panel  Tomorrow morning,   R     Signed and Held   Signed and Held  Hemoglobin A1c  Once,   R    Comments:  To assess prior glycemic control    Signed and Held          Vitals/Pain Today's Vitals   11/11/18 1615 11/11/18 1630 11/11/18 1645 11/11/18 1700  BP:  (!) 135/114  97/75  Pulse: 81   (!) 52  Resp: 16 16 14 17   Temp:      TempSrc:      SpO2: 97%   100%    Isolation Precautions No active isolations  Medications Medications  diltiazem (CARDIZEM) 100 mg in dextrose 5 % 100 mL (1 mg/mL) infusion (10 mg/hr  Intravenous Rate/Dose Verify 11/11/18 1624)  pantoprazole (PROTONIX) injection 40 mg (has no administration in time range)  sodium chloride 0.9 % bolus 1,000 mL (0 mLs Intravenous Stopped 11/11/18 1410)  diltiazem (CARDIZEM) 1 mg/mL load via infusion 10 mg (10 mg Intravenous Bolus from Bag 11/11/18 1412)    Mobility walks with device High fall risk   Focused Assessments 1   R Recommendations: See Admitting Provider Note  Report given to:   Additional Notes:  Pt supposed to be on thickened liquids at home, family has not been thickening them because pt doesn't like it

## 2018-11-11 NOTE — ED Provider Notes (Signed)
[facility] Emergency Department Provider Note  ____________________________________________   First MD Initiated Contact with Patient 11/11/18 1243     (approximate)  I have reviewed the triage vital signs and the nursing notes.   HISTORY  Chief Complaint Tachycardia    HPI Todd [name] is a 85 y.o. male here with generalized weakness.  The patient presents from oncology clinic for evaluation of tachycardia and generalized weakness.  History somewhat limited due to history of stroke with subsequent aphasia.  However, the patient states that over the last several days, he has felt generally weak.  He is had mild nausea" loud sounds" from his abdomen.  Denies any vomiting or change in bowel habits.  He admits to very poor appetite.  Has had some associated generalized weakness and has had difficulty getting around the house according to his daughter.  He lives with his son, his daughters brother.  Denies any known fevers.  He denies any known chest pain or palpitations.  He was seen in oncology clinic today, and sent here for further evaluation.  Has history of coronary disease and aortic regurgitation, but no known history of atrial fibrillation per patient and family history.  He is on Plavix but no other anticoagulants.  He has been taking his medications as prescribed.  Level 5 caveat invoked as remainder of history, ROS, and physical exam limited due to patient's expressive aphasia.         Past Medical History:  Diagnosis Date   Anxiety    Aortic regurgitation    a. 03/2017 Echo: Mod AI; b. 05/2017 Echo: Triv AI.   Arthritis    osteoarthritis-left knee   CAD (coronary artery disease)    a. Reported h/o cath ~ 2008 - ? small vessel dzs. No PCI performed; b. 03/2017 MV: EF 45-54%, no ischemia/infarct.   Depression    Diabetes mellitus without complication ([place])    Diastolic dysfunction    a. 03/2017 Echo: EF 55-65%; b. 05/2017 Echo: EF 55-60%,  no rwma, Gr1 DD. Triv AI.  Mildly dil LA.   ED (erectile dysfunction)    Elevated troponin    a. chronic mild elevation - 0.03->0.04.   GERD (gastroesophageal reflux disease)    Glaucoma    Hyperlipidemia    Hypertension    Iron deficiency anemia due to chronic blood loss    Left pontine stroke w/ cerebrovascular disease(HCC)    a. 05/2017 MRI/A: acute to subacute L pontine infarct. Chronic left thalamic lacunar infarct. Severe basilar artery stenosis w/ radiographic string sign of the distal 1/3. Mild to moderate L PCA P2 segment stenosis; b. 05/2017 Carotid U/S: <50% bilat ICA stenosis.   Lumbago    Lumbosacral Neuritis   Prostate cancer [facility])     Patient Active Problem List   Diagnosis Date Noted   Anemia due to stage 3 chronic kidney disease ([place]) 09/16/2018   Normocytic anemia 05/17/2018   Hemiplegia ([place]) 04/25/2018   B12 deficiency 04/25/2018   Dysphagia as late effect of cerebrovascular accident (CVA) 04/25/2018   Depression, major, single episode, severe ([place]) 04/25/2018   Weakness 04/20/2018   CHF (congestive heart failure) ([place]) 01/09/2018   Advanced care planning/counseling discussion 12/20/2017   Hypoxia 11/02/2017   Mixed hyperlipidemia 09/21/2017   Aphasia 05/18/2017   CAD (coronary artery disease) 05/18/2017   History of stroke with residual deficit 05/18/2017   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 11/09/2015   Knee joint replaced by other means 07/04/2015   Urinary incontinence  04/27/2015   Anxiety, generalized 04/07/2015   History of BCG vaccination 04/07/2015   GERD (gastroesophageal reflux disease) 03/04/2015   Degeneration of intervertebral disc of lumbar region 02/23/2015   Neuritis or radiculitis due to rupture of lumbar intervertebral disc 02/23/2015   Lumbar stenosis with neurogenic claudication 02/23/2015   Hypertension 12/02/2014   Diabetes mellitus associated with hormonal etiology ([place]) 12/02/2014   Benign  essential tremor 03/13/2014   Imbalance 03/13/2014   Prostate cancer ([place]) 10/07/2012   ED (erectile dysfunction) of organic origin 02/09/2012    Past Surgical History:  Procedure Laterality Date   CARPAL TUNNEL RELEASE     REPLACEMENT TOTAL KNEE BILATERAL      Prior to Admission medications   Medication Sig Start Date End Date Taking? Authorizing Provider  acetaminophen (TYLENOL 8 HOUR ARTHRITIS PAIN) 650 MG CR tablet Take 650 mg by mouth every 8 (eight) hours as needed for pain.    [provider]  aspirin EC 81 MG tablet Take 81 mg by mouth daily.    [provider]  atorvastatin (LIPITOR) 10 MG tablet Take 1 tablet (10 mg total) by mouth daily. 07/07/18   [place] Maple, MD  Blood Glucose Monitoring Suppl (ACCU-CHEK AVIVA PLUS) w/Device KIT Use to check sugar levels x 2 daily 06/13/17   Guadalupe Maple, MD  clopidogrel (PLAVIX) 75 MG tablet Take 1 tablet (75 mg total) by mouth daily. 12/20/17   Guadalupe Maple, MD  cyanocobalamin (,VITAMIN B-12,) 1000 MCG/ML injection 51m weekly x4 weeks, then 120mmonthly after that 05/07/18   CrGuadalupe MapleMD  DULoxetine (CYMBALTA) 60 MG capsule Take 1 capsule (60 mg total) by mouth daily. 04/25/18   JoValerie RoysDO  food thickener (RESOURCE THICKENUP CLEAR) POWD Follow instructions on package for nectar consistency in all liquids 04/25/18   Johnson, Megan P, DO  furosemide (LASIX) 20 MG tablet Take 1 tablet (20 mg total) by mouth daily. 10/24/18   CrGuadalupe MapleMD  gabapentin (NEURONTIN) 100 MG capsule Take 100 mg by mouth 2 (two) times daily. Once a day at night    [provider]  hydrALAZINE (APRESOLINE) 25 MG tablet Take 1 tablet (25 mg total) by mouth 3 (three) times daily. 09/29/18   CrGuadalupe MapleMD  hydrochlorothiazide (HYDRODIURIL) 25 MG tablet Take 1 tablet (25 mg total) by mouth daily. 12/20/17   CrGuadalupe MapleMD  Lancets (ACCU-CHEK SOFT TOUCH) lancets Use as instructed 06/13/17   CrGuadalupe MapleMD  latanoprost (XALATAN) 0.005 % ophthalmic solution Place 1 drop into both eyes at bedtime.  06/08/16   [provider]  magnesium oxide (MAG-OX) 400 MG tablet Take 1 tablet (400 mg total) by mouth 2 (two) times daily. 05/10/18   Dunn, RyAreta HaberPA-C  metFORMIN (GLUCOPHAGE) 500 MG tablet Take 1 tablet (500 mg total) by mouth daily with breakfast. 12/20/17   CrGuadalupe MapleMD  naproxen sodium (ALEVE) 220 MG tablet Take 220 mg by mouth 2 (two) times daily as needed.     [provider]  pantoprazole (PROTONIX) 40 MG tablet Take 1 tablet (40 mg total) by mouth daily. 08/27/18   GoMinna MerrittsMD  propranolol ER (INDERAL LA) 120 MG 24 hr capsule Take 1 capsule (120 mg total) by mouth daily. 05/20/18   Dunn, RyAreta HaberPA-C  telmisartan (MICARDIS) 80 MG tablet Take 1 tablet (80 mg total) by mouth daily. 12/20/17   CrGuadalupe MapleMD  Allergies Amlodipine; Fluoxetine; and Meloxicam  Family History  Problem Relation Age of Onset   Stroke Mother    Hypertension Mother    Hypertension Father    Stroke Sister    Prostate cancer Neg Hx    Kidney cancer Neg Hx    Bladder Cancer Neg Hx     Social History Social History   Tobacco Use   Smoking status: Former Smoker    Last attempt to quit: 12/02/1994    Years since quitting: 23.9   Smokeless tobacco: Never Used  Substance Use Topics   Alcohol use: Yes    Alcohol/week: 1.0 standard drinks    Types: 1 Cans of beer per week    Comment: occasional   Drug use: No    Review of Systems Review of Systems  Unable to perform ROS: Mental status change  Constitutional: Positive for fatigue.  Gastrointestinal: Positive for nausea.  Neurological: Positive for weakness.     ____________________________________________  PHYSICAL EXAM:  VITAL SIGNS: ED Triage Vitals [11/11/18 1249]  Enc Vitals Group     BP      Pulse Rate (!) 120     Resp 18     Temp (!) 96.8 F (36 C)     Temp Source Axillary     SpO2  97 %     Weight      Height      Head Circumference      Peak Flow      Pain Score      Pain Loc      Pain Edu?      Excl. in [place]?     Physical Exam Vitals signs and nursing note reviewed.  Constitutional:      General: He is not in acute distress.    Appearance: He is well-developed. He is ill-appearing.  HENT:     Head: Normocephalic and atraumatic.     Comments: Dry MM Eyes:     Conjunctiva/sclera: Conjunctivae normal.  Neck:     Musculoskeletal: Neck supple.  Cardiovascular:     Rate and Rhythm: Tachycardia present. Rhythm irregularly irregular.     Heart sounds: Normal heart sounds. No murmur. No friction rub.  Pulmonary:     Effort: No respiratory distress.     Breath sounds: Normal breath sounds. No wheezing or rales.     Comments: Mild tachypnea Abdominal:     General: Abdomen is flat. There is no distension.     Palpations: Abdomen is soft.     Tenderness: There is no abdominal tenderness. There is no guarding or rebound.  Musculoskeletal:     Right lower leg: Edema (trace) present.     Left lower leg: Edema (trace) present.  Skin:    General: Skin is warm.     Capillary Refill: Capillary refill takes less than 2 seconds.  Neurological:     Mental Status: He is alert and oriented to person, place, and time.     Motor: No abnormal muscle tone.       ____________________________________________   LABS (all labs ordered are listed, but only abnormal results are displayed)  Labs Reviewed  TROPONIN I - Abnormal; Notable for the following components:      Result Value   Troponin I 0.05 (*)    All other components within normal limits  CULTURE, BLOOD (ROUTINE X 2)  CULTURE, BLOOD (ROUTINE X 2)  SARS CORONAVIRUS 2 (HOSPITAL ORDER, [facility])  PROTIME-INR  LACTIC  ACID, PLASMA  TSH  LACTIC ACID, PLASMA  HEPATIC FUNCTION PANEL    ____________________________________________  EKG: Atrial fibrillation, ventricular rate 122.   Intermittent bigeminy noted.  Atrial beats showed no acute ischemia or infarct.  Compared to prior, A. fib and bigeminy is new. ________________________________________  RADIOLOGY All imaging, including plain films, CT scans, and ultrasounds, independently reviewed by me, and interpretations confirmed via formal radiology reads.  ED MD interpretation:  CT - no acute abnormality; old gallstones and kidney stones, no secondary changes; CXR - clear  Official radiology report(s): Ct Abdomen Pelvis Wo Contrast  Result Date: 11/11/2018 CLINICAL DATA:  Tachycardia and weakness EXAM: CT ABDOMEN AND PELVIS WITHOUT CONTRAST TECHNIQUE: Multidetector CT imaging of the abdomen and pelvis was performed following the standard protocol without IV contrast. COMPARISON:  None. FINDINGS: Lower chest: Lung bases demonstrate no sizable infiltrate or effusion. Some scarring in the medial right lower lobe is noted. Hepatobiliary: Gallbladder is well distended. Some faint calcifications are noted along the inferior margin of the gallbladder which may represent adherent stones. The liver demonstrates a few calcified granulomas. Pancreas: Unremarkable. No pancreatic ductal dilatation or surrounding inflammatory changes. Spleen: Normal in size without focal abnormality. Adrenals/Urinary Tract: Adrenal glands are within normal limits. Scattered hypodensities are noted throughout both kidneys consistent with cysts but incompletely evaluated on today's exam. Nonobstructing renal stones are seen bilaterally measuring less than 5 mm. The bladder is well distended. Stomach/Bowel: Appendix is not well visualized although no inflammatory changes to suggest appendicitis are noted. No obstructive or inflammatory changes of the bowel are noted. Vascular/Lymphatic: Aortic atherosclerosis. No enlarged abdominal or pelvic lymph nodes. Reproductive: Multiple brachytherapy seeds are noted within the prostate Other: No abdominal wall hernia or  abnormality. No abdominopelvic ascites. Musculoskeletal: Degenerative changes of the lumbar spine are noted. IMPRESSION: Nonobstructing renal stones bilaterally. Small faintly calcified gallstones. No acute abnormality is noted. Electronically Signed   By: Inez Catalina M.D.   On: 11/11/2018 13:59   Dg Chest Portable 1 View  Result Date: 11/11/2018 CLINICAL DATA:  Generalized weakness, tachycardia EXAM: PORTABLE CHEST 1 VIEW COMPARISON:  12/17/2017, 09/18/2018 FINDINGS: There is chronic elevation of the right diaphragm. There is no focal parenchymal opacity. There is no pleural effusion or pneumothorax. There is stable cardiomegaly. There is severe bilateral glenohumeral osteoarthritis. IMPRESSION: No active disease. Electronically Signed   By: Kathreen Devoid   On: 11/11/2018 13:31    ____________________________________________  PROCEDURES   Procedure(s) performed (including Critical Care):  .Critical Care Performed by: Duffy Bruce, MD Authorized by: Duffy Bruce, MD   Critical care provider statement:    Critical care time (minutes):  35   Critical care time was exclusive of:  Separately billable procedures and treating other patients and teaching time   Critical care was necessary to treat or prevent imminent or life-threatening deterioration of the following conditions:  Circulatory failure, cardiac failure and respiratory failure   Critical care was time spent personally by me on the following activities:  Development of treatment plan with patient or surrogate, discussions with consultants, evaluation of patient's response to treatment, examination of patient, obtaining history from patient or surrogate, ordering and performing treatments and interventions, ordering and review of laboratory studies, ordering and review of radiographic studies, pulse oximetry, re-evaluation of patient's condition and review of old charts   I assumed direction of critical care for this patient from  another provider in my specialty: no      ____________________________________________  INITIAL IMPRESSION / MDM / ASSESSMENT  AND PLAN / ED COURSE  As part of my medical decision making, I reviewed the following data within the electronic MEDICAL RECORD NUMBER Notes from prior ED visits and [place] Controlled Substance Database      *Todd [name] was evaluated in Emergency Department on 11/11/2018 for the symptoms described in the history of present illness. He was evaluated in the context of the global COVID-19 pandemic, which necessitated consideration that the patient might be at risk for infection with the SARS-CoV-2 virus that causes COVID-19. Institutional protocols and algorithms that pertain to the evaluation of patients at risk for COVID-19 are in a state of rapid change based on information released by regulatory bodies including the CDC and federal and state organizations. These policies and algorithms were followed during the patient's care in the ED.  Some ED evaluations and interventions may be delayed as a result of limited staffing during the pandemic.*   Clinical Course as of Nov 10 1413  Mon Nov 10, 4461  6731 85 year old male here with generalized weakness.  I suspect this is secondary to new onset, atrial fibrillation with rapid ventricular response.  Blood pressure improved with fluids and I suspect there is a component of dehydration due to poor p.o. intake.  Troponin elevated at 0.05, which I suspect is due to demand in the setting of his tachycardia, and is not elevated from his baseline.  He denies any chest pain or signs of acute ischemia.  Rectal temperature normal, lactic acid normal, white count at baseline, and I see no signs of acute infectious process.  Urinalysis is still pending.  Given his abdominal pain, will check a CT scan and reassess.  IV fluids given, and will start on diltiazem for persistent A. fib RVR.  Will be admitted.   [CI]    Clinical Course User Index [CI]  Duffy Bruce, MD    Medical Decision Making: 85 yo M with PMHx as above here with new onset AFib RVR, also intermittent Bigeminy. Labs from Oncology reviewed - lytes wnl, cbc at baseline. Trop 0.05 - likely demand as above. Discussed with Dr. Fletcher Anon of Cardiology who will consult. Per my review, pt has h/o PVCs but no documented h/o AFib.   CHA2Ds2-VASc Score for Atrial Fibrillation    Patient Score  Age <65 = 0 65-74 = 1 > 75 = 2 2  Sex Male = 0 Male = 1 0  CHF History No = 0  Yes = 1 1  HTN History No = 0  Yes = 1 1  Stroke/TIA/TE History No = 0  Yes = 1 1  Vascular Disease History No = 0  Yes = 1 0  Diabetes History No = 0  Yes = 1 1  Total:  6    ____________________________________________  FINAL CLINICAL IMPRESSION(S) / ED DIAGNOSES  Final diagnoses:  Atrial fibrillation with rapid ventricular response (HCC)     MEDICATIONS GIVEN DURING THIS VISIT:  Medications  diltiazem (CARDIZEM) 100 mg in dextrose 5 % 100 mL (1 mg/mL) infusion (5 mg/hr Intravenous New Bag/Given 11/11/18 1412)  sodium chloride 0.9 % bolus 1,000 mL (0 mLs Intravenous Stopped 11/11/18 1410)  diltiazem (CARDIZEM) 1 mg/mL load via infusion 10 mg (10 mg Intravenous Bolus from Bag 11/11/18 1412)     ED Discharge Orders    None       Note:  This document was prepared using Dragon voice recognition software and may include unintentional dictation errors.   Duffy Bruce, MD 11/11/18 1556

## 2018-11-12 ENCOUNTER — Other Ambulatory Visit: Payer: Self-pay

## 2018-11-12 ENCOUNTER — Inpatient Hospital Stay (HOSPITAL_COMMUNITY)
Admit: 2018-11-12 | Discharge: 2018-11-12 | Disposition: A | Discharge status: Home / Self Care | Payer: PPO | Attending: Family Medicine | Admitting: Family Medicine

## 2018-11-12 DIAGNOSIS — R9431 Abnormal electrocardiogram [ECG] [EKG]: Secondary | ICD-10-CM | POA: Diagnosis not present

## 2018-11-12 DIAGNOSIS — I361 Nonrheumatic tricuspid (valve) insufficiency: Secondary | ICD-10-CM

## 2018-11-12 DIAGNOSIS — I4892 Unspecified atrial flutter: Secondary | ICD-10-CM

## 2018-11-12 DIAGNOSIS — Z8673 Personal history of transient ischemic attack (TIA), and cerebral infarction without residual deficits: Secondary | ICD-10-CM

## 2018-11-12 DIAGNOSIS — R000 Tachycardia, unspecified: Secondary | ICD-10-CM

## 2018-11-12 DIAGNOSIS — I2510 Atherosclerotic heart disease of native coronary artery without angina pectoris: Secondary | ICD-10-CM

## 2018-11-12 DIAGNOSIS — I340 Nonrheumatic mitral (valve) insufficiency: Secondary | ICD-10-CM

## 2018-11-12 DIAGNOSIS — I10 Essential (primary) hypertension: Secondary | ICD-10-CM

## 2018-11-12 LAB — CBC
HCT: 29 % — ABNORMAL LOW (ref 39.0–52.0)
Hemoglobin: 9 g/dL — ABNORMAL LOW (ref 13.0–17.0)
MCH: 30.6 pg (ref 26.0–34.0)
MCHC: 31 g/dL (ref 30.0–36.0)
MCV: 98.6 fL (ref 80.0–100.0)
Platelets: 186 10*3/uL (ref 150–400)
RBC: 2.94 MIL/uL — ABNORMAL LOW (ref 4.22–5.81)
RDW: 13 % (ref 11.5–15.5)
WBC: 6.9 10*3/uL (ref 4.0–10.5)
nRBC: 0 % (ref 0.0–0.2)

## 2018-11-12 LAB — BASIC METABOLIC PANEL
Anion gap: 9 (ref 5–15)
BUN: 30 mg/dL — ABNORMAL HIGH (ref 8–23)
CO2: 28 mmol/L (ref 22–32)
Calcium: 8.7 mg/dL — ABNORMAL LOW (ref 8.9–10.3)
Chloride: 100 mmol/L (ref 98–111)
Creatinine, Ser: 1.18 mg/dL (ref 0.61–1.24)
GFR calc Af Amer: >60 mL/min (ref >60)
GFR calc non Af Amer: 56 mL/min — ABNORMAL LOW (ref >60)
Glucose, Bld: 195 mg/dL — ABNORMAL HIGH (ref 70–99)
Potassium: 3.5 mmol/L (ref 3.5–5.1)
Sodium: 137 mmol/L (ref 135–145)

## 2018-11-12 LAB — GLUCOSE, CAPILLARY
Glucose-Capillary: 201 mg/dL — ABNORMAL HIGH (ref 70–99)
Glucose-Capillary: 236 mg/dL — ABNORMAL HIGH (ref 70–99)
Glucose-Capillary: 143 mg/dL — ABNORMAL HIGH (ref 70–99)
Glucose-Capillary: 164 mg/dL — ABNORMAL HIGH (ref 70–99)

## 2018-11-12 LAB — ECHOCARDIOGRAM COMPLETE
Height: 67 in
Weight: 3017.6 oz

## 2018-11-12 LAB — TROPONIN I: Troponin I: 0.05 ng/mL (ref <0.03)

## 2018-11-12 MED ORDER — INSULIN GLARGINE 100 UNIT/ML ~~LOC~~ SOLN
10.0000 [IU] | Freq: Every day | SUBCUTANEOUS | Status: DC
  Administered 2018-11-12 – 2018-11-13 (×2): 10 [IU] via SUBCUTANEOUS
  Filled 2018-11-12 (×3): qty 0.1

## 2018-11-12 MED ORDER — ORAL CARE MOUTH RINSE
15.0000 mL | Freq: Two times a day (BID) | OROMUCOSAL | Status: DC
  Administered 2018-11-12 – 2018-11-16 (×3): 15 mL via OROMUCOSAL

## 2018-11-12 MED ORDER — DILTIAZEM HCL ER COATED BEADS 120 MG PO CP24
120.0000 mg | ORAL_CAPSULE | Freq: Every day | ORAL | Status: DC
  Administered 2018-11-12: 10:00:00 120 mg via ORAL
  Filled 2018-11-12: qty 1

## 2018-11-12 MED ORDER — METOPROLOL TARTRATE 25 MG PO TABS
25.0000 mg | ORAL_TABLET | Freq: Two times a day (BID) | ORAL | Status: DC
  Administered 2018-11-12: 25 mg via ORAL
  Filled 2018-11-12: qty 1

## 2018-11-12 MED ORDER — ALBUTEROL SULFATE (2.5 MG/3ML) 0.083% IN NEBU: 2.5000 mg | INHALATION_SOLUTION | RESPIRATORY_TRACT | Status: DC | PRN

## 2018-11-12 NOTE — Progress Notes (Signed)
[place] at [place] NAME: Todd [name]    MR#:  [id number]  DATE OF BIRTH:  10/11/1930  SUBJECTIVE:  Patient still in a fib HRS 110   REVIEW OF SYSTEMS:  Patient with previous CVA and has expressive aphasia.  Unable to obtain review of systems.    Tolerating Diet: yes      DRUG ALLERGIES:   Allergies  Allergen Reactions  . Amlodipine     Lower extremity swelling at 10 mg.  . Fluoxetine Other (See Comments)    headache  . Meloxicam Other (See Comments)    Other reaction(s): Dizziness Nervousness.     VITALS:  Blood pressure 126/73, pulse 65, temperature 97.7 F (36.5 C), temperature source Oral, resp. rate 16, height 5\' 7"  (1.702 m), weight 85.5 kg, SpO2 100 %.  PHYSICAL EXAMINATION:  Constitutional: Appears well-developed and well-nourished. No distress. HENT: Normocephalic. [name] Kitchen Oropharynx is clear and moist.  Eyes: Conjunctivae and EOM are normal. PERRLA, no scleral icterus.  Neck: Normal ROM. Neck supple. No JVD. No tracheal deviation. CVS: Irregular, irregular tachycardia no murmurs, no gallops, no carotid bruit.  Pulmonary: Effort and breath sounds normal, no stridor, rhonchi, wheezes, rales.  Abdominal: Soft. BS +,  no distension, tenderness, rebound or guarding.  Musculoskeletal: Normal range of motion. No edema and no tenderness.  Neuro: Alert.  Expressive aphasia from previous CVA   skin: Skin is warm and dry. No rash noted. Psychiatric: Normal mood and affect.      LABORATORY PANEL:   CBC Recent Labs  Lab 11/12/18 0459  WBC 6.9  HGB 9.0*  HCT 29.0*  PLT 186   ------------------------------------------------------------------------------------------------------------------  Chemistries  Recent Labs  Lab 11/11/18 1130 11/12/18 0459  NA 139 137  K 4.0 3.5  CL 99 100  CO2 28 28  GLUCOSE 347* 195*  BUN 33* 30*  CREATININE 1.65* 1.18  CALCIUM 9.4 8.7*  AST 21  --   ALT 12  --   ALKPHOS 67   --   BILITOT 1.1  --    ------------------------------------------------------------------------------------------------------------------  Cardiac Enzymes Recent Labs  Lab 11/11/18 1706 11/11/18 2234 11/12/18 0459  TROPONINI 0.05* 0.04* 0.05*   ------------------------------------------------------------------------------------------------------------------  RADIOLOGY:  Ct Abdomen Pelvis Wo Contrast  Result Date: 11/11/2018 CLINICAL DATA:  Tachycardia and weakness EXAM: CT ABDOMEN AND PELVIS WITHOUT CONTRAST TECHNIQUE: Multidetector CT imaging of the abdomen and pelvis was performed following the standard protocol without IV contrast. COMPARISON:  None. FINDINGS: Lower chest: Lung bases demonstrate no sizable infiltrate or effusion. Some scarring in the medial right lower lobe is noted. Hepatobiliary: Gallbladder is well distended. Some faint calcifications are noted along the inferior margin of the gallbladder which may represent adherent stones. The liver demonstrates a few calcified granulomas. Pancreas: Unremarkable. No pancreatic ductal dilatation or surrounding inflammatory changes. Spleen: Normal in size without focal abnormality. Adrenals/Urinary Tract: Adrenal glands are within normal limits. Scattered hypodensities are noted throughout both kidneys consistent with cysts but incompletely evaluated on today's exam. Nonobstructing renal stones are seen bilaterally measuring less than 5 mm. The bladder is well distended. Stomach/Bowel: Appendix is not well visualized although no inflammatory changes to suggest appendicitis are noted. No obstructive or inflammatory changes of the bowel are noted. Vascular/Lymphatic: Aortic atherosclerosis. No enlarged abdominal or pelvic lymph nodes. Reproductive: Multiple brachytherapy seeds are noted within the prostate Other: No abdominal wall hernia or abnormality. No abdominopelvic ascites. Musculoskeletal: Degenerative changes of the lumbar spine are  noted. IMPRESSION: Nonobstructing renal  stones bilaterally. Small faintly calcified gallstones. No acute abnormality is noted. Electronically Signed   By: Inez Catalina M.D.   On: 11/11/2018 13:59   Dg Chest Portable 1 View  Result Date: 11/11/2018 CLINICAL DATA:  Generalized weakness, tachycardia EXAM: PORTABLE CHEST 1 VIEW COMPARISON:  12/17/2017, 09/18/2018 FINDINGS: There is chronic elevation of the right diaphragm. There is no focal parenchymal opacity. There is no pleural effusion or pneumothorax. There is stable cardiomegaly. There is severe bilateral glenohumeral osteoarthritis. IMPRESSION: No active disease. Electronically Signed   By: Kathreen Devoid   On: 11/11/2018 13:31     ASSESSMENT AND PLAN:   85 year old male with a history of CAD, diabetes, CVA with residual aphasia and hemiparesis who presents to the emergency room due to shortness of breath and tachycardia.   1.  Atrial fibrillation/flutter with PVCs: It appears the patient has a flutter today on telemetry monitoring. Plan to start oral diltiazem and titrate up as tolerated.  Plan to discontinue diltiazem drip. Propanolol discontinued as per recommendations by cardiology. Patient may benefit from NOAC. Cardiology to make final decision about this.  2.  CAD: Continue aspirin, atorvastatin, Plavix  3.  History of CVA with residual deficit and expressive aphasia: Continue aspirin and statin  4.  Essential hypertension: Continue hydralazine, diltiazem and Avapro.  Management plans discussed with the patient and he is in agreement.  CODE STATUS: full  TOTAL TIME TAKING CARE OF THIS PATIENT: 30 minutes.   D/w dr end  POSSIBLE D/C 1-2 days, DEPENDING ON CLINICAL CONDITION.   Bettey Costa M.D on 11/12/2018 at 12:14 PM  Between 7am to 6pm - Pager - [phone] After 6pm go to [email or web address] - password [email or web address] [facility]  Office  [phone]  CC: Primary care physician; [place] Maple, MD  Note:  This dictation was prepared with Dragon dictation along with smaller phrase technology. Any transcriptional errors that result from this process are unintentional.

## 2018-11-12 NOTE — Progress Notes (Signed)
Progress Note  Patient Name: Todd [name] Date of Encounter: 11/12/2018  Primary Cardiologist: Todd Rogue, MD   Subjective   Patient with expressive aphasia but is able to nod yes or no to some questions.  He is currently not in pain or short of breath.  Inpatient Medications    Scheduled Meds:  albuterol  2.5 mg Nebulization Q6H   aspirin EC  81 mg Oral Daily   atorvastatin  10 mg Oral Daily   clopidogrel  75 mg Oral Daily   diltiazem  120 mg Oral Daily   DULoxetine  60 mg Oral Daily   enoxaparin (LOVENOX) injection  40 mg Subcutaneous Q24H   furosemide  20 mg Oral Daily   gabapentin  100 mg Oral BID   hydrALAZINE  25 mg Oral TID   insulin aspart  0-20 Units Subcutaneous TID WC   insulin aspart  0-5 Units Subcutaneous QHS   irbesartan  150 mg Oral Daily   latanoprost  1 drop Both Eyes QHS   magnesium oxide  400 mg Oral BID   mouth rinse  15 mL Mouth Rinse BID   pantoprazole (PROTONIX) IV  40 mg Intravenous Q24H   sodium chloride flush  3 mL Intravenous Q12H   Continuous Infusions:  sodium chloride 5 mL/hr at 11/12/18 0724   PRN Meds: sodium chloride, acetaminophen, ondansetron **OR** ondansetron (ZOFRAN) IV, polyethylene glycol, Resource ThickenUp Clear, sodium chloride flush   Vital Signs    Vitals:   11/11/18 2214 11/12/18 0443 11/12/18 0446 11/12/18 0817  BP: 119/76 (!) 87/62 107/61 126/73  Pulse: 64 (!) 55 64 65  Resp:    16  Temp:  (!) 97.5 F (36.4 C)  97.7 F (36.5 C)  TempSrc:  Oral  Oral  SpO2: 90% 100% 100% 100%  Weight:  85.5 kg    Height:        Intake/Output Summary (Last 24 hours) at 11/12/2018 1319 Last data filed at 11/12/2018 1040 Gross per 24 hour  Intake 513.92 ml  Output 600 ml  Net -86.08 ml   Last 3 Weights 11/12/2018 11/11/2018 11/11/2018  Weight (lbs) 188 lb 9.6 oz 190 lb 11.2 oz 199 lb  Weight (kg) 85.548 kg 86.5 kg 90.266 kg      Telemetry    Probable atrial flutter versus atrial tachycardia with variable AV  conduction.  Ventricular rates 65 to 110 bpm. - Personally Reviewed  ECG    No new tracing  Physical Exam   GEN: No acute distress.   Neck: No JVD Cardiac:  Irregular without murmurs. Respiratory:  Mildly diminished breath sounds throughout.  No wheezes or crackles. GI: Soft, nontender, non-distended  MS: No edema; No deformity. Neuro:  Nonfocal  Psych: Normal affect   Labs    Chemistry Recent Labs  Lab 11/11/18 1130 11/12/18 0459  NA 139 137  K 4.0 3.5  CL 99 100  CO2 28 28  GLUCOSE 347* 195*  BUN 33* 30*  CREATININE 1.65* 1.18  CALCIUM 9.4 8.7*  PROT 7.3  --   ALBUMIN 4.1  --   AST 21  --   ALT 12  --   ALKPHOS 67  --   BILITOT 1.1  --   GFRNONAA 37* 56*  GFRAA 43* >60  ANIONGAP 12 9     Hematology Recent Labs  Lab 11/11/18 1130 11/12/18 0459  WBC 7.7 6.9  RBC 3.34* 2.94*  HGB 10.0* 9.0*  HCT 32.6* 29.0*  MCV 97.6 98.6  MCH 29.9 30.6  MCHC 30.7 31.0  RDW 13.0 13.0  PLT 220 186    Cardiac Enzymes Recent Labs  Lab 11/11/18 1256 11/11/18 1706 11/11/18 2234 11/12/18 0459  TROPONINI 0.05* 0.05* 0.04* 0.05*   No results for input(s): TROPIPOC in the last 168 hours.   BNP Recent Labs  Lab 11/11/18 1130  BNP 472.0*     DDimer No results for input(s): DDIMER in the last 168 hours.   Radiology    Ct Abdomen Pelvis Wo Contrast  Result Date: 11/11/2018 CLINICAL DATA:  Tachycardia and weakness EXAM: CT ABDOMEN AND PELVIS WITHOUT CONTRAST TECHNIQUE: Multidetector CT imaging of the abdomen and pelvis was performed following the standard protocol without IV contrast. COMPARISON:  None. FINDINGS: Lower chest: Lung bases demonstrate no sizable infiltrate or effusion. Some scarring in the medial right lower lobe is noted. Hepatobiliary: Gallbladder is well distended. Some faint calcifications are noted along the inferior margin of the gallbladder which may represent adherent stones. The liver demonstrates a few calcified granulomas. Pancreas:  Unremarkable. No pancreatic ductal dilatation or surrounding inflammatory changes. Spleen: Normal in size without focal abnormality. Adrenals/Urinary Tract: Adrenal glands are within normal limits. Scattered hypodensities are noted throughout both kidneys consistent with cysts but incompletely evaluated on today's exam. Nonobstructing renal stones are seen bilaterally measuring less than 5 mm. The bladder is well distended. Stomach/Bowel: Appendix is not well visualized although no inflammatory changes to suggest appendicitis are noted. No obstructive or inflammatory changes of the bowel are noted. Vascular/Lymphatic: Aortic atherosclerosis. No enlarged abdominal or pelvic lymph nodes. Reproductive: Multiple brachytherapy seeds are noted within the prostate Other: No abdominal wall hernia or abnormality. No abdominopelvic ascites. Musculoskeletal: Degenerative changes of the lumbar spine are noted. IMPRESSION: Nonobstructing renal stones bilaterally. Small faintly calcified gallstones. No acute abnormality is noted. Electronically Signed   By: Todd [name] M.D.   On: 11/11/2018 13:59   Dg Chest Portable 1 View  Result Date: 11/11/2018 CLINICAL DATA:  Generalized weakness, tachycardia EXAM: PORTABLE CHEST 1 VIEW COMPARISON:  12/17/2017, 09/18/2018 FINDINGS: There is chronic elevation of the right diaphragm. There is no focal parenchymal opacity. There is no pleural effusion or pneumothorax. There is stable cardiomegaly. There is severe bilateral glenohumeral osteoarthritis. IMPRESSION: No active disease. Electronically Signed   By: Todd [name]   On: 11/11/2018 13:31    Cardiac Studies   Echocardiogram (pending)  Patient Profile     85 y.o. male with history of coronary artery disease, type 2 diabetes mellitus, hypertension, prior stroke with expressive aphasia, SVT, and frequent PVCs, admitted with tachycardia.  Assessment & Plan    Atrial flutter: Initial evaluation yesterday notable for frequent  PVCs and runs of SVT.  There was no clear evidence of atrial fibrillation.  However, telemetry overnight is consistent with atrial flutter versus atrial tachycardia with variable AV conduction.  Heart rate control has improved but remains suboptimal at times.  Todd [name] is in the process of transitioning from IV diltiazem to oral diltiazem.  Repeat EKG today to confirm atrial flutter.  If atrial flutter is confirmed, we will need to consider systemic anticoagulation given CHADSVASC score of at least 7.  However, given his comorbidities (age, residual neurologic deficits, and chronic anemia) he is at elevated risk for bleeding complications.  We will follow his heart rate over the next 12 hours.  Diltiazem may need to be escalated to achieve a target resting heart rate less than 100 bpm.  Follow-up echocardiogram.  Hypertension: Blood pressure normal  to somewhat low at times.    In order to allow for up titration of diltiazem, I will discontinue hydralazine.  If blood pressure remains soft, de-escalation or discontinuation of irbesartan will also need to be considered.  Coronary artery disease: No symptoms to suggest worsening coronary insufficiency.  Troponin minimally elevated (0.04-0.05).  This is not consistent with ACS but rather reflects supply-demand mismatch.  Continue secondary prevention.  If anticoagulation is started, I would favor discontinuation of clopidogrel +/- aspirin.  History of stroke: Given finding of likely atrial flutter, cardioembolic event may have precipitated stroke in the past.  Continue statin therapy and blood pressure control.  Anticoagulation/antiplatelet therapy, as outlined above.  For questions or updates, please contact [place] Please consult [email or web address] for contact info under [facility].     Signed, Nelva Bush, MD  11/12/2018, 1:19 PM

## 2018-11-12 NOTE — Evaluation (Signed)
Clinical/Bedside Swallow Evaluation Patient Details  Name: Todd [name] MRN: [id number] Date of Birth: 1930/07/04  Today's Date: 11/12/2018 Time: SLP Start Time (ACUTE ONLY): 14 SLP Stop Time (ACUTE ONLY): 1510 SLP Time Calculation (min) (ACUTE ONLY): 60 min  Past Medical History:  Past Medical History:  Diagnosis Date  . Anxiety   . Aortic regurgitation    a. 03/2017 Echo: Mod AI; b. 05/2017 Echo: Triv AI.  [name] Kitchen Arthritis    osteoarthritis-left knee  . CAD (coronary artery disease)    a. Reported h/o cath ~ 2008 - ? small vessel dzs. No PCI performed; b. 03/2017 MV: EF 45-54%, no ischemia/infarct.  . Depression   . Diabetes mellitus without complication ([place])   . Diastolic dysfunction    a. 03/2017 Echo: EF 55-65%; b. 05/2017 Echo: EF 55-60%, no rwma, Gr1 DD. Triv AI.  Mildly dil LA.  . ED (erectile dysfunction)   . Elevated troponin    a. chronic mild elevation - 0.03->0.04.  [name] Kitchen GERD (gastroesophageal reflux disease)   . Glaucoma   . Hyperlipidemia   . Hypertension   . Iron deficiency anemia due to chronic blood loss   . Left pontine stroke w/ cerebrovascular disease(HCC)    a. 05/2017 MRI/A: acute to subacute L pontine infarct. Chronic left thalamic lacunar infarct. Severe basilar artery stenosis w/ radiographic string sign of the distal 1/3. Mild to moderate L PCA P2 segment stenosis; b. 05/2017 Carotid U/S: <50% bilat ICA stenosis.  . Lumbago    Lumbosacral Neuritis  . Prostate cancer [facility])    Past Surgical History:  Past Surgical History:  Procedure Laterality Date  . CARPAL TUNNEL RELEASE    . REPLACEMENT TOTAL KNEE BILATERAL     HPI:  Pt is a 85 year old male with known history of coronary artery disease, type 2 diabetes, hypertension, previous CVAs with a aphasia, frequent PVCs and SVT and multiple other comorbidities listed above.  The patient has been following at the cancer center for management of normocytic anemia.  He went there today and was noted to be  tachycardic.  He has chronic complaints of fatigue that has been evaluated in the last 6 to 12 months.  Due to his tachycardia, he was sent to the emergency room.  Initially, he was thought of having A. fib with RVR and was started on diltiazem drip.  With slowing down, P waves were noted in the initial EKG was likely sinus rhythm with runs of SVT and frequent PVCs.  The patient denies chest pain or shortness of breath.  Pt has a h/o expressive aphasia/apraxia and dysphagia. There has been a suspected degree of improvement in his swallowing as pt now drinks only thin consistency liquids(d/t not liking the thickened per family), not the previously recommended Nectar consistency liquids(s/p a MBSS in 05/2018 indicating a delayed pharyngeal swallow initiation w/ aspiration of thin liquids).  Pt is on chronic O2 at home.  Current CXR reports: "No active disease".    Assessment / Plan / Recommendation Clinical Impression  Ptseen for BSE this PM. Upon entering room, pt was sitting upright in bed w/ good forward positioning eating/drinking w/ the Lunch meal. Pt is currently on a Dysphagia level 2(minced foods) w/ thin liquids as per order at admission. Pt exhibits Expressive Aphasia and Apraxia impairing his verbal communication, however, he appears to have adequate Receptive language abilities and was able to communicate w/ SLP for basic questions via Y/N(head movements).  Pt appears to demonstrate functional oropharyngeal swallowing w/ a modified  diet consistency(foods). He appears at Mild risk for aspiration/choking which is reduced following general aspiration precautions and a modified consistency diet. Pt consumed ~10 ozs of thin liquids via cup/straw feeding self post setup - NO overt s/s of aspiration were noted w/ no decline in vocal quality (during phonations) or in respiratory status during/post trials. During the oral phase, pt exhibited min increased time for full mastication/mashing/breaking down of the  Minced foods appearing d/t lacking Dentition; trials of Puree foods were more easily management and cleared during the oral phase. Pt was instructed to take his time w/ the Minced foods, chew carefully, and clear fully b/t bites - Alternate foods/liquids to aid clearing.  OM exam was unremarkable for grossl unilateral weakness(possible Left decreased tone/sensation noted intermittently). Pt fed self w/ setup.  Recommend continue current diet as ordered - Dysphagia level 2(MINCED foods) moistened w/ Thin liquids. Recommend aspiration precautions and monitoring at meals; Pills in puree for safer, easier swallowing.  Family contacted post evaluation and stated agreement w/ above - they indicated they prepared a similar diet consistency at home. ST services will be available for any further education if needed while admitted; precautions posted in room. NSG updated.  SLP Visit Diagnosis: Dysphagia, oropharyngeal phase (R13.12)    Aspiration Risk  Mild aspiration risk(but reduced following precautions)    Diet Recommendation  Dysphagia level 2 (MINCED foods) w/ Thin liquids; general aspiration precautions including eating/drinking slowly and clearing mouth b/t bites of foods; Monitoring at meals for setup and any needs  Medication Administration: Whole meds with puree(for safer swallowing overall)    Other  Recommendations Recommended Consults: (Dietician f/u) Oral Care Recommendations: Oral care before and after PO;Staff/trained caregiver to provide oral care Other Recommendations: (n/a)   Follow up Recommendations None      Frequency and Duration (n/a)  (n/a)       Prognosis Prognosis for Safe Diet Advancement: Fair(-Good) Barriers to Reach Goals: Language deficits;Severity of deficits;Time post onset      Swallow Study   General Date of Onset: 11/11/18 HPI: Pt is a 85 year old male with known history of coronary artery disease, type 2 diabetes, hypertension, previous CVAs with a aphasia,  frequent PVCs and SVT and multiple other comorbidities listed above.  The patient has been following at the cancer center for management of normocytic anemia.  He went there today and was noted to be tachycardic.  He has chronic complaints of fatigue that has been evaluated in the last 6 to 12 months.  Due to his tachycardia, he was sent to the emergency room.  Initially, he was thought of having A. fib with RVR and was started on diltiazem drip.  With slowing down, P waves were noted in the initial EKG was likely sinus rhythm with runs of SVT and frequent PVCs.  The patient denies chest pain or shortness of breath.  Pt has a h/o expressive aphasia/apraxia and dysphagia. There has been a suspected degree of improvement in his swallowing as pt now drinks only thin consistency liquids(d/t not liking the thickened per family), not the previously recommended Nectar consistency liquids(s/p a MBSS in 05/2018 indicating a delayed pharyngeal swallow initiation w/ aspiration of thin liquids).  Pt is on chronic O2 at home.  Current CXR reports: "No active disease".  Type of Study: Bedside Swallow Evaluation Previous Swallow Assessment: 05/2018; Outpt ST services for the Aphasia throughout 2019 Diet Prior to this Study: Dysphagia 2 (chopped);Thin liquids(per family report/description) Temperature Spikes Noted: No(wbc 6.9) Respiratory Status: Nasal cannula(4  liters; chronic) History of Recent Intubation: No Behavior/Cognition: Alert;Cooperative;Pleasant mood;Requires cueing(Expressive Aphasia - severe; has Receptive abilities) Oral Cavity Assessment: Within Functional Limits Oral Care Completed by SLP: Recent completion by staff Oral Cavity - Dentition: Poor condition;Missing dentition(partial (1)) Vision: Functional for self-feeding Self-Feeding Abilities: Able to feed self;Needs assist;Needs set up(some restricted movement in hands) Patient Positioning: Upright in bed Baseline Vocal Quality: Normal Volitional  Cough: Cognitively unable to elicit Volitional Swallow: Unable to elicit    Oral/Motor/Sensory Function Overall Oral Motor/Sensory Function: Within functional limits(grossly w/ few involuntary movements)   Ice Chips Ice chips: Within functional limits Presentation: Spoon(fed; 2 trials)   Thin Liquid Thin Liquid: Within functional limits Presentation: Cup;Self Fed;Straw(~10 ozs total)    Nectar Thick Nectar Thick Liquid: Not tested   Honey Thick Honey Thick Liquid: Not tested   Puree Puree: Within functional limits Presentation: Spoon;Self Fed(4 trials)   Solid     Solid: Impaired(soft foods; minced) Presentation: Self Fed;Spoon(4 trials) Oral Phase Impairments: Impaired mastication(missing dentition) Oral Phase Functional Implications: Impaired mastication;Prolonged oral transit(missing dentition) Pharyngeal Phase Impairments: (none)       Orinda Kenner, MS, CCC-SLP Kechia Yahnke 11/12/2018,3:51 PM

## 2018-11-12 NOTE — Progress Notes (Signed)
Initial Nutrition Assessment  RD working remotely.  DOCUMENTATION CODES:   Not applicable  INTERVENTION:  Provide Magic cup BID with lunch and dinner, each supplement provides 290 kcal and 9 grams of protein.  Will monitor for outcome of SLP evaluation and for adequacy of intake.  NUTRITION DIAGNOSIS:   Swallowing difficulty related to dysphagia as evidenced by (need for dysphagia 2 diet s/p CVA).  GOAL:   Patient will meet greater than or equal to 90% of their needs  MONITOR:   PO intake, Supplement acceptance, Labs, Weight trends, I & O's  REASON FOR ASSESSMENT:   Consult Assessment of nutrition requirement/status  ASSESSMENT:   85 year old male with PMHx of anxiety, depression, HTN, HLD, DM, prostate cancer, CAD, iron deficiency anemia, GERD, arthritis, hx CVA with residual hemiparesis and expressive aphasia admitted with Atrial fibrillation/flutter with PVCs.  Patient unable to provide history in setting of expressive aphasia. Per H&P patient is on dysphagia 2 diet with thickened liquids and plan was for SLP evaluation. However, patient currently ordered for thin liquids and no SLP consult yet. Discussed with RN. Patient has not been having trouble with thin liquids, but unsure what baseline diet was in setting of previous CVA. Plan is for SLP evaluation. Per chart patient ate 100% of his breakfast today.  Per chart patient was 89.8 kg on 07/08/2018. He is currently 85.5 kg. He has lost 4.3 kg (4.8% body weight) over the past 4 months, which is not significant for time frame.  Medications reviewed and include: Lasix 20 mg daily, gabapentin, Novolog 0-20 units TID, Novolog 0-5 units QHS, magnesium oxide 400 mg BID, pantoprazole.  Labs reviewed: CBG 201-236, BUN 30.  NUTRITION - FOCUSED PHYSICAL EXAM:  Unable to complete at this time.  Diet Order:   Diet Order            DIET DYS 2 Room service appropriate? Yes; Fluid consistency: Thin  Diet effective now              EDUCATION NEEDS:   No education needs have been identified at this time  Skin:  Skin Assessment: Reviewed RN Assessment  Last BM:  Unknown/PTA  Height:   Ht Readings from Last 1 Encounters:  11/11/18 5\' 7"  (1.702 m)   Weight:   Wt Readings from Last 1 Encounters:  11/12/18 85.5 kg   Ideal Body Weight:  67.3 kg  BMI:  Body mass index is 29.54 kg/m.  Estimated Nutritional Needs:   Kcal:  1800-2000  Protein:  90-100 grams  Fluid:  per MD  Willey Blade, MS, RD, LDN Office: [phone] Pager: [phone] After Hours/Weekend Pager: [phone]

## 2018-11-12 NOTE — Progress Notes (Signed)
RN updated sister and daugter in law about patient condition. The patient lives with the daughter in law and should be updated first at [phone].

## 2018-11-12 NOTE — Progress Notes (Signed)
RN updated pts sister, Cecille Rubin. I will continue to assess.

## 2018-11-12 NOTE — Progress Notes (Signed)
*  PRELIMINARY RESULTS* Echocardiogram 2D Echocardiogram has been performed.  Todd [name] 11/12/2018, 12:47 PM

## 2018-11-13 ENCOUNTER — Ambulatory Visit: Payer: PPO | Admitting: Cardiovascular Disease

## 2018-11-13 DIAGNOSIS — I4891 Unspecified atrial fibrillation: Secondary | ICD-10-CM

## 2018-11-13 LAB — BASIC METABOLIC PANEL
Anion gap: 10 (ref 5–15)
BUN: 30 mg/dL — ABNORMAL HIGH (ref 8–23)
CO2: 28 mmol/L (ref 22–32)
Calcium: 8.9 mg/dL (ref 8.9–10.3)
Chloride: 97 mmol/L — ABNORMAL LOW (ref 98–111)
Creatinine, Ser: 1.21 mg/dL (ref 0.61–1.24)
GFR calc Af Amer: >60 mL/min (ref >60)
GFR calc non Af Amer: 54 mL/min — ABNORMAL LOW (ref >60)
Glucose, Bld: 236 mg/dL — ABNORMAL HIGH (ref 70–99)
Potassium: 3.7 mmol/L (ref 3.5–5.1)
Sodium: 135 mmol/L (ref 135–145)

## 2018-11-13 LAB — PROTIME-INR
INR: 1.1 (ref 0.8–1.2)
Prothrombin Time: 14.1 seconds (ref 11.4–15.2)

## 2018-11-13 LAB — GLUCOSE, CAPILLARY
Glucose-Capillary: 175 mg/dL — ABNORMAL HIGH (ref 70–99)
Glucose-Capillary: 220 mg/dL — ABNORMAL HIGH (ref 70–99)
Glucose-Capillary: 144 mg/dL — ABNORMAL HIGH (ref 70–99)
Glucose-Capillary: 192 mg/dL — ABNORMAL HIGH (ref 70–99)

## 2018-11-13 LAB — CBC
HCT: 28.2 % — ABNORMAL LOW (ref 39.0–52.0)
Hemoglobin: 9 g/dL — ABNORMAL LOW (ref 13.0–17.0)
MCH: 30.3 pg (ref 26.0–34.0)
MCHC: 31.9 g/dL (ref 30.0–36.0)
MCV: 94.9 fL (ref 80.0–100.0)
Platelets: 191 10*3/uL (ref 150–400)
RBC: 2.97 MIL/uL — ABNORMAL LOW (ref 4.22–5.81)
RDW: 12.8 % (ref 11.5–15.5)
WBC: 7.6 10*3/uL (ref 4.0–10.5)
nRBC: 0 % (ref 0.0–0.2)

## 2018-11-13 LAB — APTT: aPTT: 33 seconds (ref 24–36)

## 2018-11-13 MED ORDER — HEPARIN (PORCINE) 25000 UT/250ML-% IV SOLN
1200.0000 [IU]/h | INTRAVENOUS | Status: DC
  Administered 2018-11-13: 1200 [IU]/h via INTRAVENOUS
  Filled 2018-11-13: qty 250

## 2018-11-13 MED ORDER — APIXABAN 5 MG PO TABS
5.0000 mg | ORAL_TABLET | Freq: Two times a day (BID) | ORAL | Status: DC
  Administered 2018-11-13 – 2018-11-17 (×9): 5 mg via ORAL
  Filled 2018-11-13 (×9): qty 1

## 2018-11-13 MED ORDER — HEPARIN BOLUS VIA INFUSION
4200.0000 [IU] | Freq: Once | INTRAVENOUS | Status: AC
  Administered 2018-11-13: 4200 [IU] via INTRAVENOUS
  Filled 2018-11-13: qty 4200

## 2018-11-13 MED ORDER — METOPROLOL TARTRATE 25 MG PO TABS
37.5000 mg | ORAL_TABLET | Freq: Four times a day (QID) | ORAL | Status: DC
  Administered 2018-11-13 – 2018-11-14 (×3): 37.5 mg via ORAL
  Filled 2018-11-13 (×3): qty 2

## 2018-11-13 MED ORDER — METOPROLOL TARTRATE 50 MG PO TABS
50.0000 mg | ORAL_TABLET | Freq: Two times a day (BID) | ORAL | Status: DC
  Administered 2018-11-13: 50 mg via ORAL
  Filled 2018-11-13: qty 1

## 2018-11-13 MED ORDER — IRBESARTAN 150 MG PO TABS
75.0000 mg | ORAL_TABLET | Freq: Every day | ORAL | Status: DC
  Administered 2018-11-13 – 2018-11-14 (×2): 75 mg via ORAL
  Filled 2018-11-13 (×2): qty 1

## 2018-11-13 NOTE — Progress Notes (Signed)
[facility] [place] NAME: Todd [name]    MR#:  [id number]  DATE OF BIRTH:  1931-05-13  SUBJECTIVE:  Heart rate still not well controlled.   REVIEW OF SYSTEMS:  Patient with previous CVA and has expressive aphasia.  Unable to obtain review of systems.    Tolerating Diet: yes      DRUG ALLERGIES:   Allergies  Allergen Reactions  . Amlodipine     Lower extremity swelling at 10 mg.  . Fluoxetine Other (See Comments)    headache  . Meloxicam Other (See Comments)    Other reaction(s): Dizziness Nervousness.     VITALS:  Blood pressure 121/73, pulse (!) 128, temperature 98.6 F (37 C), temperature source Oral, resp. rate 19, height 5\' 7"  (1.702 m), weight 86.2 kg, SpO2 100 %.  PHYSICAL EXAMINATION:  Constitutional: Appears well-developed and well-nourished. No distress. HENT: Normocephalic. [name] Kitchen Oropharynx is clear and moist.  Eyes: Conjunctivae and EOM are normal. PERRLA, no scleral icterus.  Neck: Normal ROM. Neck supple. No JVD. No tracheal deviation. CVS: Irregular, irregular tachycardia no murmurs, no gallops, no carotid bruit.  Pulmonary: Effort and breath sounds normal, no stridor, rhonchi, wheezes, rales.  Abdominal: Soft. BS +,  no distension, tenderness, rebound or guarding.  Musculoskeletal: Normal range of motion. No edema and no tenderness.  Neuro: Alert.  Expressive aphasia from previous CVA   skin: Skin is warm and dry. No rash noted. Psychiatric: Normal mood and affect.      LABORATORY PANEL:   CBC Recent Labs  Lab 11/13/18 0308  WBC 7.6  HGB 9.0*  HCT 28.2*  PLT 191   ------------------------------------------------------------------------------------------------------------------  Chemistries  Recent Labs  Lab 11/11/18 1130  11/13/18 0308  NA 139   < > 135  K 4.0   < > 3.7  CL 99   < > 97*  CO2 28   < > 28  GLUCOSE 347*   < > 236*  BUN 33*   < > 30*  CREATININE 1.65*   < > 1.21  CALCIUM  9.4   < > 8.9  AST 21  --   --   ALT 12  --   --   ALKPHOS 67  --   --   BILITOT 1.1  --   --    < > = values in this interval not displayed.   ------------------------------------------------------------------------------------------------------------------  Cardiac Enzymes Recent Labs  Lab 11/11/18 1706 11/11/18 2234 11/12/18 0459  TROPONINI 0.05* 0.04* 0.05*   ------------------------------------------------------------------------------------------------------------------  RADIOLOGY:  Ct Abdomen Pelvis Wo Contrast  Result Date: 11/11/2018 CLINICAL DATA:  Tachycardia and weakness EXAM: CT ABDOMEN AND PELVIS WITHOUT CONTRAST TECHNIQUE: Multidetector CT imaging of the abdomen and pelvis was performed following the standard protocol without IV contrast. COMPARISON:  None. FINDINGS: Lower chest: Lung bases demonstrate no sizable infiltrate or effusion. Some scarring in the medial right lower lobe is noted. Hepatobiliary: Gallbladder is well distended. Some faint calcifications are noted along the inferior margin of the gallbladder which may represent adherent stones. The liver demonstrates a few calcified granulomas. Pancreas: Unremarkable. No pancreatic ductal dilatation or surrounding inflammatory changes. Spleen: Normal in size without focal abnormality. Adrenals/Urinary Tract: Adrenal glands are within normal limits. Scattered hypodensities are noted throughout both kidneys consistent with cysts but incompletely evaluated on today's exam. Nonobstructing renal stones are seen bilaterally measuring less than 5 mm. The bladder is well distended. Stomach/Bowel: Appendix is not well visualized although no inflammatory changes to  suggest appendicitis are noted. No obstructive or inflammatory changes of the bowel are noted. Vascular/Lymphatic: Aortic atherosclerosis. No enlarged abdominal or pelvic lymph nodes. Reproductive: Multiple brachytherapy seeds are noted within the prostate Other: No  abdominal wall hernia or abnormality. No abdominopelvic ascites. Musculoskeletal: Degenerative changes of the lumbar spine are noted. IMPRESSION: Nonobstructing renal stones bilaterally. Small faintly calcified gallstones. No acute abnormality is noted. Electronically Signed   By: Inez Catalina M.D.   On: 11/11/2018 13:59   Dg Chest Portable 1 View  Result Date: 11/11/2018 CLINICAL DATA:  Generalized weakness, tachycardia EXAM: PORTABLE CHEST 1 VIEW COMPARISON:  12/17/2017, 09/18/2018 FINDINGS: There is chronic elevation of the right diaphragm. There is no focal parenchymal opacity. There is no pleural effusion or pneumothorax. There is stable cardiomegaly. There is severe bilateral glenohumeral osteoarthritis. IMPRESSION: No active disease. Electronically Signed   By: Kathreen Devoid   On: 11/11/2018 13:31     ASSESSMENT AND PLAN:   85 year old male with a history of CAD, diabetes, CVA with residual aphasia and hemiparesis who presents to the emergency room due to shortness of breath and tachycardia.   1.  Atrial fibrillation/flutter with PVCs:IMPRESSIONS  Echo shows  1. The left ventricle has mild-moderately reduced systolic function, with an ejection fraction of 40-45%. The cavity size was normal. There is mildly increased left ventricular wall thickness. Left ventricular diastolic function could not be evaluated. The mitral valve is degenerative. There is mild mitral annular calcification present.   Increase metoprolol for better heart rate control. Will discuss with family about adding NOAC. Cardiology consult appreciated  2.  CAD: Continue aspirin, beta-blocker atorvastatin, Plavix  3.  History of CVA with residual deficit and expressive aphasia: Continue aspirin and statin Dysphagia level 2 (MINCED foods) w/ Thin liquids; general aspiration precautions including eating/drinking slowly and clearing mouth b/t bites of foods; Monitoring at meals for setup and any needs   4.  Essential  hypertension: Continue hydralazine, diltiazem and Avapro.  Management plans discussed with the patient and he is in agreement.  CODE STATUS: full  TOTAL TIME TAKING CARE OF THIS PATIENT: 25 minutes.   D/w dr end  POSSIBLE D/C 1-2 days, DEPENDING ON CLINICAL CONDITION.   Bettey Costa M.D on 11/13/2018 at 11:40 AM  Between 7am to 6pm - Pager - [phone] After 6pm go to [email or web address] - password [email or web address] [facility]  Office  [phone]  CC: Primary care physician; Guadalupe Maple, MD  Note: This dictation was prepared with Dragon dictation along with smaller phrase technology. Any transcriptional errors that result from this process are unintentional.

## 2018-11-13 NOTE — Progress Notes (Addendum)
Inpatient Diabetes Program Recommendations  AACE/ADA: New Consensus Statement on Inpatient Glycemic Control   Target Ranges:  Prepandial:   less than 140 mg/dL      Peak postprandial:   less than 180 mg/dL (1-2 hours)      Critically ill patients:  140 - 180 mg/dL  Results for Todd [name], Todd [name] (MRN [id number]) as of 11/13/2018 13:23  Ref. Range 11/12/2018 08:16 11/12/2018 11:44 11/12/2018 16:38 11/12/2018 21:14 11/13/2018 08:02 11/13/2018 11:52  Glucose-Capillary Latest Ref Range: 70 - 99 mg/dL 201 (H) 236 (H) 143 (H) 164 (H) 175 (H) 220 (H)   Results for Todd [name], Todd [name] (MRN [id number]) as of 11/13/2018 13:23  Ref. Range 11/11/2018 12:56  Hemoglobin A1C Latest Ref Range: 4.8 - 5.6 % 9.2 (H)   Review of Glycemic Control  Diabetes history:DM2 Outpatient Diabetes medications: Metformin 500 mg daily Current orders for Inpatient glycemic control: Lantus 10 units daily, Novolog 0-20 units TID with meals, Novolog 0-5 units QHS  Inpatient Diabetes Program Recommendations:   HgbA1C: A1C 9.2% on 11/11/18 indicating an average glucose of 217 mg/dl over the past 2-3 months. MD may want to consider adjusting outpatient DM medications at time of discharge and ask family to start monitoring glucose.   NOTE: Patient aphasic from prior CVA and is cared for by family. Called patient's daughter [name]) to discuss DM control. Ardeen Fillers asked that her sister in law [name]) be called to discuss DM since patient lives with her and she works in the Physicist, medical and has some medical knowledge. Called [place] and left a message for her to return call. [employer] returned call and had [place] on the line as well so they both could be included in the discussion. [name] verified that patient takes only Metformin 500 mg daily for DM control. Patient does not have a glucometer or testing supplies and they do not currently check patient's glucose at all since his doctor has not asked them to. Patient will need Rx for  glucometer and testing supplies at discharge (#[id number]). Discussed glucose monitoring and [name] was able to verbalize the technique to check glucose. Discussed A1C results (9.2% on 11/11/18) and explained that current A1C indicates an average glucose of 217 mg/dl over the past 2-3 months. Discussed glucose and A1C goals. Discussed importance of checking CBGs and maintaining good CBG control to prevent long-term and short-term complications. Explained that given patient's age, medical history, and aphagia, his glucose goals and A1C will likely be a little higher.  Encouraged family to check glucose at least 1-2 times a day and to let his PCP know if his glucose is staying over 200 mg/dl consistently. Discussed impact of nutrition, exercise, stress, sickness, and medications on diabetes control.  Patient normally drinks Gatorade, Boost, V-8 infusion, and water.  Encouraged family to get low calorie gatorade and Glucerna to help decrease carbohydrates in fluids. Family reports patient has a great appetite and eats well.  Discussed carbohydrates, portion sizes, and reading food labels. Explained that if the doctor makes any adjustments with DM medications at discharge, it will be listed on the discharge medication list. Both Ardeen Fillers and [name] verbalized understanding of information discussed and report they have no further questions at this time related to diabetes.  Thanks, Barnie Alderman, RN, MSN, CDE Diabetes Coordinator Inpatient Diabetes Program [phone] (Team Pager)

## 2018-11-13 NOTE — Progress Notes (Signed)
D/w POA, [place] SE/a/b/r of NOAC. She says low rosk for falls and someone at house 24h/day. No GIB   She is ok with starting NOAC.  D/c plavix/asa and start eliquis

## 2018-11-13 NOTE — Progress Notes (Addendum)
Speech Language Pathology Treatment: Dysphagia  Patient Details Name: Todd [name] MRN: [id number] DOB: Oct 25, 1930 Today's Date: 11/13/2018 Time: 0930-1020 SLP Time Calculation (min) (ACUTE ONLY): 50 min  Assessment / Plan / Recommendation Clinical Impression  Ptseen for ongoing assessment of toleration of diet s/p evaluation yesterday and discussion w/ family via phone. Pt has had baseline dysphagia s/p previous CVAs but has been tolerating a chopped diet w/ thin liquids, and per family, no Pulmonary decline has been dx'd since January 2020, and pt does not exhibit overt s/s of aspiration in front of them at meals or w/ po's. Family stated they would like for him to continue w/ this current diet at this time d/t pt preferring to drink thin liquids per their report.   Upon entering room, pt was resting in bed but stated the word "cold" often; heat turned up and blanket provided. Pt was positioned upright w/ good forward positioning eating/drinking w/ the breakfast meal. Pt is currently on a Dysphagia level 2(minced foods) w/ thin liquids as per order at admission. Pt exhibits Expressive Aphasia and Apraxia impairing his verbal communication, however, he appears to have somewhat adequate Receptive language abilities and was able to communicate w/ NSG/SLP for basic questions via Y/N(head movements).   Pt appeared to tolerate trials of thin liquids via Cup and chopped/minced solids w/ no immediate, overt s/s of aspiration noted by SLP or NSG in room. He fed self w/ tray setup but needed support. He consumed 10+ sips of thin liquids via Cup w/ no decline in vocal quality (during phonations) and no decline in respiratory status during/post trials. During the oral phase, pt exhibited min increased time for full mastication/mashing/breaking down of the Minced foods appearing d/t lacking Dentition; trials of Puree foods were more easily management and cleared easily during the oral phase. Pt then consumed Pills  WHOLE in Puree w/ NSG/SLP - recommend swallowing w/ a puree vs a liquid for safer pharyngeal swallowing. Pt was instructed to take his time w/ the Minced foods, chew carefully, and clear mouth fully b/t bites - Alternate foods/liquids to aid clearing.  Pt appears at Mild risk for aspiration/choking which is reduced following general aspiration precautions and a modified consistency diet. Recommend continue current diet as ordered - Dysphagia level 2(MINCED foods) moistened w/ Thin liquids. Recommend aspiration precautions and monitoring at meals; Pills in puree for safer, easier swallowing.  Family contacted post evaluation yesterday and stated agreement w/ this diet consistency. Precautions posted in room. NSG updated and will reconsult ST services if any decline in status while admitted.     HPI HPI: Pt is a 85 year old male with known history of coronary artery disease, type 2 diabetes, hypertension, previous CVAs with a aphasia, frequent PVCs and SVT and multiple other comorbidities listed above.  The patient has been following at the cancer center for management of normocytic anemia.  He went there today and was noted to be tachycardic.  He has chronic complaints of fatigue that has been evaluated in the last 6 to 12 months.  Due to his tachycardia, he was sent to the emergency room.  Initially, he was thought of having A. fib with RVR and was started on diltiazem drip.  With slowing down, P waves were noted in the initial EKG was likely sinus rhythm with runs of SVT and frequent PVCs.  The patient denies chest pain or shortness of breath.  Pt has a h/o expressive aphasia/apraxia and dysphagia. There has been a suspected degree of improvement  in his swallowing as pt now drinks only thin consistency liquids(d/t not liking the thickened per family), not the previously recommended Nectar consistency liquids(s/p a MBSS in 05/2018 indicating a delayed pharyngeal swallow initiation w/ aspiration of thin liquids).   Pt is on chronic O2 at home.  Current CXR reports: "No active disease".  Baseline Expressive Aphasia s/p previous CVAs.      SLP Plan  All goals met       Recommendations  Diet recommendations: Thin liquid;Dysphagia 2 (fine chop) Liquids provided via: Cup Medication Administration: Whole meds with puree(for safer swallowing ) Supervision: Patient able to self feed;Staff to assist with self feeding;Intermittent supervision to cue for compensatory strategies(tray setup at meals) Compensations: Minimize environmental distractions;Slow rate;Small sips/bites;Lingual sweep for clearance of pocketing;Multiple dry swallows after each bite/sip;Follow solids with liquid Postural Changes and/or Swallow Maneuvers: Seated upright 90 degrees;Upright 30-60 min after meal                General recommendations: (dietician f/u) Oral Care Recommendations: Oral care before and after PO;Staff/trained caregiver to provide oral care Follow up Recommendations: None SLP Visit Diagnosis: Dysphagia, oropharyngeal phase (R13.12)(baseline s/p previous CVA) Plan: All goals met       GO                Orinda Kenner, MS, CCC-SLP , 11/13/2018, 3:24 PM

## 2018-11-13 NOTE — Consult Note (Signed)
ANTICOAGULATION CONSULT NOTE - Initial Consult  Pharmacy Consult for Eliquis  Indication: atrial fibrillation  Allergies  Allergen Reactions  . Amlodipine     Lower extremity swelling at 10 mg.  . Fluoxetine Other (See Comments)    headache  . Meloxicam Other (See Comments)    Other reaction(s): Dizziness Nervousness.     Patient Measurements: Height: 5\' 7"  (170.2 cm) Weight: 190 lb 1.6 oz (86.2 kg) IBW/kg (Calculated) : 66.1   Vital Signs: Temp: 98.6 F (37 C) (06/10 0801) Temp Source: Oral (06/10 0801) BP: 121/73 (06/10 0801) Pulse Rate: 128 (06/10 0801)  Labs: Recent Labs    11/11/18 1130  11/11/18 1256 11/11/18 1706 11/11/18 2234 11/12/18 0459 11/13/18 0308 11/13/18 0739  HGB 10.0*  --   --   --   --  9.0* 9.0*  --   HCT 32.6*  --   --   --   --  29.0* 28.2*  --   PLT 220  --   --   --   --  186 191  --   APTT  --   --   --   --   --   --   --  33  LABPROT  --   --  13.4  --   --   --   --  14.1  INR  --   --  1.0  --   --   --   --  1.1  CREATININE 1.65*  --   --   --   --  1.18 1.21  --   TROPONINI  --    < > 0.05* 0.05* 0.04* 0.05*  --   --    < > = values in this interval not displayed.    Estimated Creatinine Clearance: 45.9 mL/min (by C-G formula based on SCr of 1.21 mg/dL).   Medical History: Past Medical History:  Diagnosis Date  . Anxiety   . Aortic regurgitation    a. 03/2017 Echo: Mod AI; b. 05/2017 Echo: Triv AI.  [name] Kitchen Arthritis    osteoarthritis-left knee  . CAD (coronary artery disease)    a. Reported h/o cath ~ 2008 - ? small vessel dzs. No PCI performed; b. 03/2017 MV: EF 45-54%, no ischemia/infarct.  . Depression   . Diabetes mellitus without complication ([place])   . Diastolic dysfunction    a. 03/2017 Echo: EF 55-65%; b. 05/2017 Echo: EF 55-60%, no rwma, Gr1 DD. Triv AI.  Mildly dil LA.  . ED (erectile dysfunction)   . Elevated troponin    a. chronic mild elevation - 0.03->0.04.  [name] Kitchen GERD (gastroesophageal reflux disease)   .  Glaucoma   . Hyperlipidemia   . Hypertension   . Iron deficiency anemia due to chronic blood loss   . Left pontine stroke w/ cerebrovascular disease(HCC)    a. 05/2017 MRI/A: acute to subacute L pontine infarct. Chronic left thalamic lacunar infarct. Severe basilar artery stenosis w/ radiographic string sign of the distal 1/3. Mild to moderate L PCA P2 segment stenosis; b. 05/2017 Carotid U/S: <50% bilat ICA stenosis.  . Lumbago    Lumbosacral Neuritis  . Prostate cancer [facility])     Assessment: Pharmacy consulted for heparin infusion dosing and monitoring in 85 yo male with A. Fib. Patient received enoxaparin 40mg  SQ 6/9 @ 2200.   Patient was started on heparin gtt, but this was recently stopped. MD discussed starting Eliquis with patient's family.   Patient's renal function is appropriate and  RBC/Hgb are stable.     Plan:  Will start Eliquis 5 mg twice daily    Rowland Lathe, PharmD Clinical Pharmacist 11/13/2018 12:00 PM

## 2018-11-13 NOTE — Progress Notes (Signed)
I have already requested the physician contact the patient's point person for a daily update earlier today. Will continue to monitor. Wenda Low [facility]

## 2018-11-13 NOTE — Consult Note (Signed)
ANTICOAGULATION CONSULT NOTE - Initial Consult  Pharmacy Consult for Heparin infusion dosing and monitoring  Indication: atrial fibrillation  Allergies  Allergen Reactions  . Amlodipine     Lower extremity swelling at 10 mg.  . Fluoxetine Other (See Comments)    headache  . Meloxicam Other (See Comments)    Other reaction(s): Dizziness Nervousness.     Patient Measurements: Height: 5\' 7"  (170.2 cm) Weight: 190 lb 1.6 oz (86.2 kg) IBW/kg (Calculated) : 66.1 Heparin Dosing Weight: 83 kg  Vital Signs: Temp: 98.6 F (37 C) (06/10 0454) Temp Source: Oral (06/10 0454) BP: 118/91 (06/10 0454) Pulse Rate: 113 (06/10 0637)  Labs: Recent Labs    11/11/18 1130  11/11/18 1256 11/11/18 1706 11/11/18 2234 11/12/18 0459 11/13/18 0308  HGB 10.0*  --   --   --   --  9.0* 9.0*  HCT 32.6*  --   --   --   --  29.0* 28.2*  PLT 220  --   --   --   --  186 191  LABPROT  --   --  13.4  --   --   --   --   INR  --   --  1.0  --   --   --   --   CREATININE 1.65*  --   --   --   --  1.18 1.21  TROPONINI  --    < > 0.05* 0.05* 0.04* 0.05*  --    < > = values in this interval not displayed.    Estimated Creatinine Clearance: 45.9 mL/min (by C-G formula based on SCr of 1.21 mg/dL).   Medical History: Past Medical History:  Diagnosis Date  . Anxiety   . Aortic regurgitation    a. 03/2017 Echo: Mod AI; b. 05/2017 Echo: Triv AI.  [name] Kitchen Arthritis    osteoarthritis-left knee  . CAD (coronary artery disease)    a. Reported h/o cath ~ 2008 - ? small vessel dzs. No PCI performed; b. 03/2017 MV: EF 45-54%, no ischemia/infarct.  . Depression   . Diabetes mellitus without complication ([place])   . Diastolic dysfunction    a. 03/2017 Echo: EF 55-65%; b. 05/2017 Echo: EF 55-60%, no rwma, Gr1 DD. Triv AI.  Mildly dil LA.  . ED (erectile dysfunction)   . Elevated troponin    a. chronic mild elevation - 0.03->0.04.  [name] Kitchen GERD (gastroesophageal reflux disease)   . Glaucoma   . Hyperlipidemia   .  Hypertension   . Iron deficiency anemia due to chronic blood loss   . Left pontine stroke w/ cerebrovascular disease(HCC)    a. 05/2017 MRI/A: acute to subacute L pontine infarct. Chronic left thalamic lacunar infarct. Severe basilar artery stenosis w/ radiographic string sign of the distal 1/3. Mild to moderate L PCA P2 segment stenosis; b. 05/2017 Carotid U/S: <50% bilat ICA stenosis.  . Lumbago    Lumbosacral Neuritis  . Prostate cancer [facility])     Assessment: Pharmacy consulted for heparin infusion dosing and monitoring in 85 yo male with A. Fib. Patient received enoxaparin 40mg  SQ 6/9 @ 2200.   Goal of Therapy:  Heparin level 0.3-0.7 units/ml Monitor platelets by anticoagulation protocol: Yes   Plan:  Baseline labs ordered Give 4200 units bolus x 1 Start heparin infusion at 1200 units/hr Check anti-Xa level in 8 hours and daily while on heparin Continue to monitor H&H and platelets  Pernell Dupre, PharmD, BCPS Clinical Pharmacist 11/13/2018 6:48 AM

## 2018-11-13 NOTE — Progress Notes (Addendum)
Progress Note  Patient Name: Todd [name] Date of Encounter: 11/13/2018  Primary Cardiologist: Ida Rogue, MD   Subjective   As previously documented, patient with expressive aphasia. Nods yes and no to some questions. Denies chest pain. Reports that he feels comfortable at this time. He does not feel SOB.  Inpatient Medications    Scheduled Meds: . apixaban  5 mg Oral BID  . atorvastatin  10 mg Oral Daily  . DULoxetine  60 mg Oral Daily  . furosemide  20 mg Oral Daily  . gabapentin  100 mg Oral BID  . insulin aspart  0-20 Units Subcutaneous TID WC  . insulin aspart  0-5 Units Subcutaneous QHS  . insulin glargine  10 Units Subcutaneous Daily  . irbesartan  75 mg Oral Daily  . latanoprost  1 drop Both Eyes QHS  . magnesium oxide  400 mg Oral BID  . mouth rinse  15 mL Mouth Rinse BID  . metoprolol tartrate  50 mg Oral BID  . pantoprazole (PROTONIX) IV  40 mg Intravenous Q24H  . sodium chloride flush  3 mL Intravenous Q12H   Continuous Infusions: . sodium chloride Stopped (11/12/18 1142)   PRN Meds: sodium chloride, acetaminophen, albuterol, ondansetron **OR** ondansetron (ZOFRAN) IV, polyethylene glycol, Resource ThickenUp Clear, sodium chloride flush   Vital Signs    Vitals:   11/13/18 0033 11/13/18 0454 11/13/18 0637 11/13/18 0801  BP:  (!) 118/91  121/73  Pulse: (!) 107 (!) 131 (!) 113 (!) 128  Resp:  18  19  Temp:  98.6 F (37 C)  98.6 F (37 C)  TempSrc:  Oral  Oral  SpO2:  92%  100%  Weight:  86.2 kg    Height:        Intake/Output Summary (Last 24 hours) at 11/13/2018 1408 Last data filed at 11/13/2018 1052 Gross per 24 hour  Intake 359.06 ml  Output 900 ml  Net -540.94 ml   Filed Weights   11/11/18 1939 11/12/18 0443 11/13/18 0454  Weight: 86.5 kg 85.5 kg 86.2 kg    Telemetry    Coarse Afib with PVCs and aberrancy, ventricular rate 110-130s - Personally Reviewed  ECG    11/12/2018 EKG with Aflutter with variable block v. Coarse Afib with  aberrancy; ventricular rate 126bpm, PVCs, LAFB - Personally Reviewed  Physical Exam   GEN: No acute distress.   Neck: No JVD Cardiac: Irregular and tachycardic; no murmurs, rubs, or gallops.  Respiratory: Poor inspiratory effort with bilateral reduced breath sounds. GI: Soft, nontender, non-distended  MS: No edema; No deformity. Neuro:  Nonfocal  Psych: Normal affect   Labs    Chemistry Recent Labs  Lab 11/11/18 1130 11/12/18 0459 11/13/18 0308  NA 139 137 135  K 4.0 3.5 3.7  CL 99 100 97*  CO2 28 28 28   GLUCOSE 347* 195* 236*  BUN 33* 30* 30*  CREATININE 1.65* 1.18 1.21  CALCIUM 9.4 8.7* 8.9  PROT 7.3  --   --   ALBUMIN 4.1  --   --   AST 21  --   --   ALT 12  --   --   ALKPHOS 67  --   --   BILITOT 1.1  --   --   GFRNONAA 37* 56* 54*  GFRAA 43* >60 >60  ANIONGAP 12 9 10      Hematology Recent Labs  Lab 11/11/18 1130 11/12/18 0459 11/13/18 0308  WBC 7.7 6.9 7.6  RBC 3.34* 2.94*  2.97*  HGB 10.0* 9.0* 9.0*  HCT 32.6* 29.0* 28.2*  MCV 97.6 98.6 94.9  MCH 29.9 30.6 30.3  MCHC 30.7 31.0 31.9  RDW 13.0 13.0 12.8  PLT 220 186 191    Cardiac Enzymes Recent Labs  Lab 11/11/18 1256 11/11/18 1706 11/11/18 2234 11/12/18 0459  TROPONINI 0.05* 0.05* 0.04* 0.05*   No results for input(s): TROPIPOC in the last 168 hours.   BNP Recent Labs  Lab 11/11/18 1130  BNP 472.0*     DDimer No results for input(s): DDIMER in the last 168 hours.   Radiology    No results found.  Cardiac Studies   TTE 11/12/2018  1. The left ventricle has mild-moderately reduced systolic function, with an ejection fraction of 40-45%. The cavity size was normal. There is mildly increased left ventricular wall thickness. Left ventricular diastolic function could not be evaluated.  2. LVEF may be underestimated due to atrial flutter with variable AV conduction.  3. The right ventricle has moderately reduced systolic function. The cavity was normal. There is no increase in right  ventricular wall thickness. Right ventricular systolic pressure is moderately elevated with an estimated pressure of 54.9 mmHg.  4. Left atrial size was mildly dilated.  5. Right atrial size was mildly dilated.  6. The pericardial effusion is posterior to the left ventricle.  7. Trivial pericardial effusion is present.  8. The mitral valve is degenerative. There is mild mitral annular calcification present.  9. Tricuspid valve regurgitation is mild-moderate. 10. The aortic valve is tricuspid. Mild thickening of the aortic valve. Moderate calcification of the aortic valve. Aortic valve regurgitation is trivial by color flow Doppler. 11. There is mild dilatation of the aortic root measuring 38 mm. 12. The inferior vena cava was dilated in size with <50% respiratory variability.   Patient Profile     85 y.o. male with history of CAD, DM2, HTN, prior stroke with expressive aphasia, SVT, frequent PVCs, and who we are admitting d/t tachycardia.   Assessment & Plan    Atrial flutter / Coarse Atrial Fibrillation with RVR --Initial telemetry and EKGs significant for frequent PVCs and short runs of SVT but without clear evidence of atrial fibrillation. Since admission, telemetry more consistent with atrial flutter versus atrial tachycardia with variable AV conduction.  --Updated echo as above with EF 40-45%, moderately elevated RVSP. Suspect that sub-optimal rate control has been ongoing for some time and contributed to reduced EF.  --Diltiazem stopped due to reduced EF. Started on metoprolol yesterday for rate control. Per IM, metoprolol increased to 50mg  BID this AM. HR control still sub-optimal. Could consider q6h dosing for better HR control and as titrate BB for goal resting ventricular rate of less than 100 bpm.  Continue to follow on telemetry.  Consider consolidation to Toprol-XL before discharge and once optimal HR control achieved. --Anticoagulation to be discussed with family before discharge  given CHA2DS2VASc score of at least 7 versus increased risk for bleeding when consider comorbid conditions (age, residual neurologic deficits, and chronic anemia). If anticoagulation started, consider Eliquis at 5mg  BID.  Does not currently meet at least 2 of 3 of criteria for reduced dosing (Criteria: 82y, 1.5 Cr, Wt 60kg); however, recommend close monitoring of renal function as does meet age criteria and current Cr 1.21 with Cr 1.65 at admission. Continue heparin for now and until decision regarding Eliquis / chronic anticoagulation. If start Eliquis, can discontinue heparin and recommend discontinue ASA +/- Plavix as below to avoid triple therapy.  HFrEF, Pulmonary HTN --Denies SOB on exam.  --Started on lasix 20mg  daily.  --Daily BMET. Continue with close monitoring of renal function and electrolytes. --Continue to monitor I/O, daily weights. Wt 188.6lbs  190.1lbs. -871mL since admission. --Continue irbesartan 75mg  daily. Continue BB. Hydralazine discontinued to allow for escalation of BB for rate control as needed.  Hypertension - Blood pressure controlled and at times soft with 24h SBP 103-128; DBP 64-91. - Hydralazine discontinued to allow for escalation of metoprolol for rate control.  Continue ARB as BP and renal function allow.  CAD --Shakes head to indicate no CP on exam. No sx to suggest worsening CAD.  -Troponin minimally elevated 0.04  0.05, which is not consistent with ACS and likely reflects supply demand mismatch. --Echo as above. --Continue medical management. Aggressive risk factor modification with BP control and statin therapy. --As previously noted, if anticoagulation started, consider discontinuation of ASA +/-clopidogrel to avoid triple therapy.  History of stroke --Given atrial flutter this admission, consider etiology of past stroke d/t cardioembolic event. --Continue statin therapy, BP control. --Anticoagulation / antiplatelet therapy recommendations as above to be  discussed with family before discharge.   For questions or updates, please contact [place] Please consult [email or web address] for contact info under        Signed, Arvil Chaco, PA-C  11/13/2018, 2:08 PM

## 2018-11-14 LAB — CBC
HCT: 29.3 % — ABNORMAL LOW (ref 39.0–52.0)
Hemoglobin: 9.2 g/dL — ABNORMAL LOW (ref 13.0–17.0)
MCH: 30.2 pg (ref 26.0–34.0)
MCHC: 31.4 g/dL (ref 30.0–36.0)
MCV: 96.1 fL (ref 80.0–100.0)
Platelets: 204 10*3/uL (ref 150–400)
RBC: 3.05 MIL/uL — ABNORMAL LOW (ref 4.22–5.81)
RDW: 12.7 % (ref 11.5–15.5)
WBC: 7.1 10*3/uL (ref 4.0–10.5)
nRBC: 0 % (ref 0.0–0.2)

## 2018-11-14 LAB — GLUCOSE, CAPILLARY
Glucose-Capillary: 206 mg/dL — ABNORMAL HIGH (ref 70–99)
Glucose-Capillary: 238 mg/dL — ABNORMAL HIGH (ref 70–99)
Glucose-Capillary: 161 mg/dL — ABNORMAL HIGH (ref 70–99)
Glucose-Capillary: 179 mg/dL — ABNORMAL HIGH (ref 70–99)

## 2018-11-14 LAB — BASIC METABOLIC PANEL
Anion gap: 8 (ref 5–15)
BUN: 30 mg/dL — ABNORMAL HIGH (ref 8–23)
CO2: 30 mmol/L (ref 22–32)
Calcium: 9.1 mg/dL (ref 8.9–10.3)
Chloride: 98 mmol/L (ref 98–111)
Creatinine, Ser: 1.28 mg/dL — ABNORMAL HIGH (ref 0.61–1.24)
GFR calc Af Amer: 58 mL/min — ABNORMAL LOW (ref >60)
GFR calc non Af Amer: 50 mL/min — ABNORMAL LOW (ref >60)
Glucose, Bld: 207 mg/dL — ABNORMAL HIGH (ref 70–99)
Potassium: 4 mmol/L (ref 3.5–5.1)
Sodium: 136 mmol/L (ref 135–145)

## 2018-11-14 MED ORDER — INSULIN GLARGINE 100 UNIT/ML ~~LOC~~ SOLN
12.0000 [IU] | Freq: Every day | SUBCUTANEOUS | Status: DC
  Administered 2018-11-14 – 2018-11-16 (×3): 12 [IU] via SUBCUTANEOUS
  Filled 2018-11-14 (×4): qty 0.12

## 2018-11-14 MED ORDER — METOPROLOL TARTRATE 50 MG PO TABS
50.0000 mg | ORAL_TABLET | Freq: Four times a day (QID) | ORAL | Status: DC
  Filled 2018-11-14: qty 1

## 2018-11-14 MED ORDER — SODIUM CHLORIDE 0.9 % IV BOLUS
500.0000 mL | Freq: Once | INTRAVENOUS | Status: AC
  Administered 2018-11-14: 500 mL via INTRAVENOUS

## 2018-11-14 MED ORDER — METOPROLOL TARTRATE 25 MG PO TABS
37.5000 mg | ORAL_TABLET | Freq: Four times a day (QID) | ORAL | Status: AC
  Administered 2018-11-14 (×2): 37.5 mg via ORAL
  Filled 2018-11-14 (×2): qty 2

## 2018-11-14 NOTE — Progress Notes (Signed)
Patient with BP systolic in the 33'F.  Cardiology made aware. Patient asymptomatic.  Will recheck BP prior to 1800 metoprolol dose due.  Per cardiology, will hold that dose if SBP <100

## 2018-11-14 NOTE — Progress Notes (Addendum)
[facility] NAME: Todd [name]    MR#:  [id number]  DATE OF BIRTH:  22-Jan-1931  SUBJECTIVE:  HR not controlled this am Nurse reports no acute events overnight.   REVIEW OF SYSTEMS:  Patient with previous CVA and has expressive aphasia.  Unable to obtain review of systems.    Tolerating Diet: yes      DRUG ALLERGIES:   Allergies  Allergen Reactions  . Amlodipine     Lower extremity swelling at 10 mg.  . Fluoxetine Other (See Comments)    headache  . Meloxicam Other (See Comments)    Other reaction(s): Dizziness Nervousness.     VITALS:  Blood pressure 110/76, pulse (!) 131, temperature 98.5 F (36.9 C), temperature source Oral, resp. rate 19, height 5\' 7"  (1.702 m), weight 85.8 kg, SpO2 100 %.  PHYSICAL EXAMINATION:  Constitutional: Appears well-developed and well-nourished. No distress. HENT: Normocephalic. [name] Kitchen Oropharynx is clear and moist.  Eyes: Conjunctivae and EOM are normal. PERRLA, no scleral icterus.  Neck: Normal ROM. Neck supple. No JVD. No tracheal deviation. CVS: Irregular, irregular tachycardia no murmurs, no gallops, no carotid bruit.  Pulmonary: Effort and breath sounds normal, no stridor, rhonchi, wheezes, rales.  Abdominal: Soft. BS +,  no distension, tenderness, rebound or guarding.  Musculoskeletal: Normal range of motion. No edema and no tenderness.  Neuro: Alert.  Expressive aphasia from previous CVA   skin: Skin is warm and dry. No rash noted. Psychiatric: Normal mood and affect.      LABORATORY PANEL:   CBC Recent Labs  Lab 11/14/18 0539  WBC 7.1  HGB 9.2*  HCT 29.3*  PLT 204   ------------------------------------------------------------------------------------------------------------------  Chemistries  Recent Labs  Lab 11/11/18 1130  11/14/18 0539  NA 139   < > 136  K 4.0   < > 4.0  CL 99   < > 98  CO2 28   < > 30  GLUCOSE 347*   < > 207*  BUN 33*   < > 30*   CREATININE 1.65*   < > 1.28*  CALCIUM 9.4   < > 9.1  AST 21  --   --   ALT 12  --   --   ALKPHOS 67  --   --   BILITOT 1.1  --   --    < > = values in this interval not displayed.   ------------------------------------------------------------------------------------------------------------------  Cardiac Enzymes Recent Labs  Lab 11/11/18 1706 11/11/18 2234 11/12/18 0459  TROPONINI 0.05* 0.04* 0.05*   ------------------------------------------------------------------------------------------------------------------  RADIOLOGY:  No results found.   ASSESSMENT AND PLAN:   85 year old male with a history of CAD, diabetes, CVA with residual aphasia and hemiparesis who presents to the emergency room due to shortness of breath and tachycardia.   1.  Atrial fibrillation/flutter with PVCs:IMPRESSIONS  Echo shows  1. The left ventricle has mild-moderately reduced systolic function, with an ejection fraction of 40-45%. The cavity size was normal. There is mildly increased left ventricular wall thickness. Left ventricular diastolic function could not be evaluated. The mitral valve is degenerative. There is mild mitral annular calcification present.   Metoprolol dose increased this morning. Continue Eliquis for CVA prevention.   2.  CAD: Continue aspirin, beta-blocker atorvastatin, Plavix  3.  History of CVA with residual deficit and expressive aphasia: Continue aspirin and statin Dysphagia level 2 (MINCED foods) w/ Thin liquids; general aspiration precautions including eating/drinking slowly and clearing mouth b/t bites of foods;  Monitoring at meals for setup and any needs   4.  Essential hypertension: Continue Toprol  5.  Chronic systolic heart failure ejection fraction 40 to 45%: Continue Lasix Monitor intake and output with daily weight.    Management plans discussed with cardiology CODE STATUS: full  TOTAL TIME TAKING CARE OF THIS PATIENT: 25 minutes.     POSSIBLE  D/C 1-2 days, DEPENDING ON CLINICAL CONDITION.   Bettey Costa M.D on 11/14/2018 at 12:17 PM  Between 7am to 6pm - Pager - [phone] After 6pm go to [email or web address] - password [email or web address] [facility]  Office  [phone]  CC: Primary care physician; Guadalupe Maple, MD  Note: This dictation was prepared with Dragon dictation along with smaller phrase technology. Any transcriptional errors that result from this process are unintentional.

## 2018-11-14 NOTE — Progress Notes (Signed)
Inpatient Diabetes Program Recommendations  AACE/ADA: New Consensus Statement on Inpatient Glycemic Control   Target Ranges:  Prepandial:   less than 140 mg/dL      Peak postprandial:   less than 180 mg/dL (1-2 hours)      Critically ill patients:  140 - 180 mg/dL  Results for KINGJAMES, COURY (MRN [id number]) as of 11/14/2018 08:53  Ref. Range 11/13/2018 08:02 11/13/2018 11:52 11/13/2018 16:41 11/13/2018 21:28 11/14/2018 07:38  Glucose-Capillary Latest Ref Range: 70 - 99 mg/dL 175 (H) 220 (H) 144 (H) 192 (H) 206 (H)    Review of Glycemic Control  Diabetes history:DM2 Outpatient Diabetes medications: Metformin 500 mg daily Current orders for Inpatient glycemic control: Lantus 10 units daily, Novolog 0-20 units TID with meals, Novolog 0-5 units QHS  Inpatient Diabetes Program Recommendations:   Insulin-Basal: Please consider increasing Lantus to 12 units daily.  Diet: If appropriate, please add Carb Modified to current diet to decrease amount of carbohydrates per meal.  HgbA1C: A1C 9.2% on 11/11/18 indicating an average glucose of 217 mg/dl over the past 2-3 months. MD may want to consider adjusting outpatient DM medications at time of discharge and ask family to start monitoring glucose.   Thanks, Barnie Alderman, RN, MSN, CDE Diabetes Coordinator Inpatient Diabetes Program [phone] (Team Pager from 8am to 5pm)

## 2018-11-14 NOTE — Care Management Important Message (Signed)
Important Message  Patient Details  Name: Todd [name] MRN: [id number] Date of Birth: Oct 29, 1930   Medicare Important Message Given:  Yes    Dannette Barbara 11/14/2018, 1:17 PM

## 2018-11-14 NOTE — Progress Notes (Signed)
Tried to call back [name], patient's daughter in law but went straight to voicemail. Called Todd [name] instead which is patient's daughter and updated her about the plan of care for this patient, asked if she can update the rest of the family about his situation.

## 2018-11-14 NOTE — Progress Notes (Addendum)
Progress Note  Patient Name: Todd [name] Date of Encounter: 11/14/2018  Primary Cardiologist: Ida Rogue, MD   Subjective   As previously documented, patient with expressive aphasia. Nods yes and no to some questions. Shakes his head no to CP, SOB, racing HR, palpitations. Shakes his head yes to feeling stronger this AM.  Inpatient Medications    Scheduled Meds: . apixaban  5 mg Oral BID  . atorvastatin  10 mg Oral Daily  . DULoxetine  60 mg Oral Daily  . furosemide  20 mg Oral Daily  . gabapentin  100 mg Oral BID  . insulin aspart  0-20 Units Subcutaneous TID WC  . insulin aspart  0-5 Units Subcutaneous QHS  . insulin glargine  10 Units Subcutaneous Daily  . irbesartan  75 mg Oral Daily  . latanoprost  1 drop Both Eyes QHS  . magnesium oxide  400 mg Oral BID  . mouth rinse  15 mL Mouth Rinse BID  . metoprolol tartrate  37.5 mg Oral Q6H  . pantoprazole (PROTONIX) IV  40 mg Intravenous Q24H  . sodium chloride flush  3 mL Intravenous Q12H   Continuous Infusions: . sodium chloride Stopped (11/12/18 1142)   PRN Meds: sodium chloride, acetaminophen, albuterol, ondansetron **OR** ondansetron (ZOFRAN) IV, polyethylene glycol, Resource ThickenUp Clear, sodium chloride flush   Vital Signs    Vitals:   11/13/18 2004 11/14/18 0500 11/14/18 0505 11/14/18 0736  BP:   118/87 110/76  Pulse: (!) 120  (!) 141 (!) 131  Resp:   20 19  Temp:   98.2 F (36.8 C) 98.5 F (36.9 C)  TempSrc:   Oral Oral  SpO2:   100% 100%  Weight:  85.8 kg    Height:        Intake/Output Summary (Last 24 hours) at 11/14/2018 0859 Last data filed at 11/14/2018 0500 Gross per 24 hour  Intake 240 ml  Output 1100 ml  Net -860 ml   Filed Weights   11/12/18 0443 11/13/18 0454 11/14/18 0500  Weight: 85.5 kg 86.2 kg 85.8 kg    Telemetry    Coarse Afib with PVCs and aberrancy, ventricular rate into the 130s - Personally Reviewed  ECG    No new tracings - Personally Reviewed  Physical Exam    GEN: No acute distress.  Expressive aphasia. Neck: No JVD. Elevated CVP. Cardiac: IRIR and tachycardic; no murmurs, rubs, or gallops.  Respiratory: Poor inspiratory effort with bilateral reduced breath sounds. GI: Soft, nontender, non-distended  MS: No bilateral lower extremity edema; No deformity. Neuro:  Expressive aphasia Psych: Normal affect   Labs    Chemistry Recent Labs  Lab 11/11/18 1130 11/12/18 0459 11/13/18 0308 11/14/18 0539  NA 139 137 135 136  K 4.0 3.5 3.7 4.0  CL 99 100 97* 98  CO2 28 28 28 30   GLUCOSE 347* 195* 236* 207*  BUN 33* 30* 30* 30*  CREATININE 1.65* 1.18 1.21 1.28*  CALCIUM 9.4 8.7* 8.9 9.1  PROT 7.3  --   --   --   ALBUMIN 4.1  --   --   --   AST 21  --   --   --   ALT 12  --   --   --   ALKPHOS 67  --   --   --   BILITOT 1.1  --   --   --   GFRNONAA 37* 56* 54* 50*  GFRAA 43* >60 >60 58*  ANIONGAP 12 9  10 8     Hematology Recent Labs  Lab 11/12/18 0459 11/13/18 0308 11/14/18 0539  WBC 6.9 7.6 7.1  RBC 2.94* 2.97* 3.05*  HGB 9.0* 9.0* 9.2*  HCT 29.0* 28.2* 29.3*  MCV 98.6 94.9 96.1  MCH 30.6 30.3 30.2  MCHC 31.0 31.9 31.4  RDW 13.0 12.8 12.7  PLT 186 191 204    Cardiac Enzymes Recent Labs  Lab 11/11/18 1256 11/11/18 1706 11/11/18 2234 11/12/18 0459  TROPONINI 0.05* 0.05* 0.04* 0.05*   No results for input(s): TROPIPOC in the last 168 hours.   BNP Recent Labs  Lab 11/11/18 1130  BNP 472.0*     DDimer No results for input(s): DDIMER in the last 168 hours.   Radiology    No results found.  Cardiac Studies   TTE 11/12/2018  1. The left ventricle has mild-moderately reduced systolic function, with an ejection fraction of 40-45%. The cavity size was normal. There is mildly increased left ventricular wall thickness. Left ventricular diastolic function could not be evaluated.  2. LVEF may be underestimated due to atrial flutter with variable AV conduction.  3. The right ventricle has moderately reduced systolic  function. The cavity was normal. There is no increase in right ventricular wall thickness. Right ventricular systolic pressure is moderately elevated with an estimated pressure of 54.9 mmHg.  4. Left atrial size was mildly dilated.  5. Right atrial size was mildly dilated.  6. The pericardial effusion is posterior to the left ventricle.  7. Trivial pericardial effusion is present.  8. The mitral valve is degenerative. There is mild mitral annular calcification present.  9. Tricuspid valve regurgitation is mild-moderate. 10. The aortic valve is tricuspid. Mild thickening of the aortic valve. Moderate calcification of the aortic valve. Aortic valve regurgitation is trivial by color flow Doppler. 11. There is mild dilatation of the aortic root measuring 38 mm. 12. The inferior vena cava was dilated in size with <50% respiratory variability.   Patient Profile     85 y.o. male with history of newly diagnosed atrial Fibrillation verus flutter with variable block, CAD, DM2, HTN, prior stroke with expressive aphasia, h/o SVT and frequent PVCs, and who we are admitting d/t tachycardia.   Assessment & Plan    Atrial flutter / Coarse Atrial Fibrillation with RVR --Initial telemetry and EKGs with frequent PVCs, short runs of SVT, no clear evidence of atrial fibrillation. Since admission, evidence of atrial fibrillation v flutter with variable block. Updated echo EF 40-45%, moderately elevated RVSP.   --Rate control has been suboptimal this admission. Started on metoprolol 37.5mg  q6h dosing yesterday and in hopes of better HR control with goal ventricular rate of less than 110 at rest. Continued elevated rates this AM into the 130s with soft BP.  --Will stop his ARB tomorrow to allow for HR control in setting of soft BP. --Starting tomorrow, escalate BB as tolerated with goal of metoprolol tartrate 50mg  q6h to control HR as BP allows. As previously noted, could also consider digoxin or diltiazem, though  both options are sub-optimal given recent AKI and low EF. --Not an ideal candidate for TEE/DCCV given residual deficits following stroke.  Could consider DCCV once therapeutically anticoagulated later down the road and and pending discussion with family. --CHA2DS2VASc score of at least 8 (CHF, HTN, agex2, DM2, strokex2, vascular) and started on Eliquis at 5mg  BID yesterday. Recommend close monitoring of renal function and reduced dose if Cr elevates above 1.5. Discontinued ASA and Plavix with start of Eliquis.  Plan for close monitoring of Hgb with daily CBC d/t chronic anemia.  HFrEF, Pulmonary HTN --Denies SOB on exam. Echo as above, moderately elevated RVSP. EF 40-45%. --Started on lasix 20mg  daily. Daily BMET. Close monitoring of renal function and electrolytes in setting of recent AKI. Continue to monitor I/O, daily weights. Wt 190.1lbs  189.2lbs. -1.7L for admission. Cr 1.21  1.28; BUN 30. K 4.0. --Stop irbesartan 75mg  qd given soft BP and need for control of HR. Already received AM dose of ARB today, so will plan to stop starting tomorrow 6/12. Do not recommend antihypertensives given soft BP complicating rate control at this time. --Continue BB at q6 dosing and escalate as tolerated starting tomorrow for HR control.  Hypertension - Blood pressure controlled and at times soft, limiting HR control.  --Stop ARB / irbesartan. Do not recommend antihypertensives at this time. --Plan to escalate BB tomorrow and as tolerated by BP for optimal HR control.  CAD --Shakes head to indicate no CP on exam. No sx to suggest worsening CAD.  -Troponin minimally elevated 0.04  0.05, which is not consistent with ACS and likely reflects supply demand mismatch. Echo as above. --Continue medical management. Aggressive risk factor modification with BP control and statin therapy. As above, ASA and Plavix discontinued with start of Eliquis.  History of stroke --Given atrial flutter this admission, consider  etiology of past stroke d/t cardioembolic event. Continue statin therapy, BP control. --Eliquis 5mg  BID started yesterday. Daily CBC.  For questions or updates, please contact [place] Please consult [email or web address] for contact info under        Signed, Arvil Chaco, PA-C  11/14/2018, 8:59 AM

## 2018-11-14 NOTE — Progress Notes (Signed)
Talked to Dr. Jannifer Franklin about patient's HR has been 120's-130's all day, talked to tele and they say its a-flutter, cardiology increase his metoprolol today to 50 mg to be start in the morning and has scheduled metoprolol 37.5 at midnight but his BP has been soft this afternoon at 85/60, BP now though is 97/65. Order to give 500 ml NS bolus and to recheck BP again if its greater than 105/60 then give metoprolol. RN will continue to monitor.

## 2018-11-15 DIAGNOSIS — I420 Dilated cardiomyopathy: Secondary | ICD-10-CM

## 2018-11-15 DIAGNOSIS — I639 Cerebral infarction, unspecified: Secondary | ICD-10-CM

## 2018-11-15 DIAGNOSIS — I272 Pulmonary hypertension, unspecified: Secondary | ICD-10-CM

## 2018-11-15 LAB — GLUCOSE, CAPILLARY
Glucose-Capillary: 144 mg/dL — ABNORMAL HIGH (ref 70–99)
Glucose-Capillary: 134 mg/dL — ABNORMAL HIGH (ref 70–99)
Glucose-Capillary: 178 mg/dL — ABNORMAL HIGH (ref 70–99)
Glucose-Capillary: 191 mg/dL — ABNORMAL HIGH (ref 70–99)

## 2018-11-15 LAB — BASIC METABOLIC PANEL
Anion gap: 9 (ref 5–15)
BUN: 35 mg/dL — ABNORMAL HIGH (ref 8–23)
CO2: 30 mmol/L (ref 22–32)
Calcium: 9 mg/dL (ref 8.9–10.3)
Chloride: 99 mmol/L (ref 98–111)
Creatinine, Ser: 1.33 mg/dL — ABNORMAL HIGH (ref 0.61–1.24)
GFR calc Af Amer: 56 mL/min — ABNORMAL LOW (ref >60)
GFR calc non Af Amer: 48 mL/min — ABNORMAL LOW (ref >60)
Glucose, Bld: 169 mg/dL — ABNORMAL HIGH (ref 70–99)
Potassium: 4.4 mmol/L (ref 3.5–5.1)
Sodium: 138 mmol/L (ref 135–145)

## 2018-11-15 MED ORDER — DIGOXIN 0.25 MG/ML IJ SOLN
0.2500 mg | Freq: Once | INTRAMUSCULAR | Status: AC
  Administered 2018-11-15: 0.25 mg via INTRAVENOUS
  Filled 2018-11-15: qty 2

## 2018-11-15 MED ORDER — METOPROLOL TARTRATE 25 MG PO TABS
37.5000 mg | ORAL_TABLET | Freq: Four times a day (QID) | ORAL | Status: DC
  Administered 2018-11-15 – 2018-11-17 (×9): 37.5 mg via ORAL
  Filled 2018-11-15 (×9): qty 2

## 2018-11-15 MED ORDER — DIGOXIN 125 MCG PO TABS
0.1250 mg | ORAL_TABLET | Freq: Every day | ORAL | Status: DC
  Administered 2018-11-15 – 2018-11-17 (×3): 0.125 mg via ORAL
  Filled 2018-11-15 (×3): qty 1

## 2018-11-15 MED ORDER — POLYETHYLENE GLYCOL 3350 17 G PO PACK
17.0000 g | PACK | Freq: Every day | ORAL | Status: DC
  Administered 2018-11-15 – 2018-11-17 (×3): 17 g via ORAL
  Filled 2018-11-15 (×3): qty 1

## 2018-11-15 NOTE — Progress Notes (Signed)
Progress Note  Patient Name: Todd [name] Date of Encounter: 11/15/2018  Primary Cardiologist: Rockey Situ  Subjective   He remains in Afib/flutter with RVR with ventricular rates in the 120s bpm. He wants to go home.   Inpatient Medications    Scheduled Meds: . apixaban  5 mg Oral BID  . atorvastatin  10 mg Oral Daily  . DULoxetine  60 mg Oral Daily  . furosemide  20 mg Oral Daily  . gabapentin  100 mg Oral BID  . insulin aspart  0-20 Units Subcutaneous TID WC  . insulin aspart  0-5 Units Subcutaneous QHS  . insulin glargine  12 Units Subcutaneous Daily  . latanoprost  1 drop Both Eyes QHS  . magnesium oxide  400 mg Oral BID  . mouth rinse  15 mL Mouth Rinse BID  . metoprolol tartrate  50 mg Oral Q6H  . pantoprazole (PROTONIX) IV  40 mg Intravenous Q24H  . sodium chloride flush  3 mL Intravenous Q12H   Continuous Infusions: . sodium chloride Stopped (11/12/18 1142)   PRN Meds: sodium chloride, acetaminophen, albuterol, ondansetron **OR** ondansetron (ZOFRAN) IV, polyethylene glycol, Resource ThickenUp Clear, sodium chloride flush   Vital Signs    Vitals:   11/15/18 0501 11/15/18 0503 11/15/18 0533 11/15/18 0824  BP: (!) 80/63 107/68 97/69 111/79  Pulse: (!) 131 (!) 131 (!) 129 (!) 128  Resp: 19     Temp: 97.7 F (36.5 C)   (!) 97.4 F (36.3 C)  TempSrc: Oral   Oral  SpO2: 100%   100%  Weight:   86.8 kg   Height:        Intake/Output Summary (Last 24 hours) at 11/15/2018 0850 Last data filed at 11/15/2018 0700 Gross per 24 hour  Intake 240 ml  Output 300 ml  Net -60 ml   Filed Weights   11/13/18 0454 11/14/18 0500 11/15/18 0533  Weight: 86.2 kg 85.8 kg 86.8 kg    Telemetry    Afib/flutter with RVR - Personally Reviewed  ECG    n/a - Personally Reviewed  Physical Exam   GEN: No acute distress.   Neck: No JVD. Cardiac: Tachycardic, irregularly irregular, no murmurs, rubs, or gallops.  Respiratory: Clear to auscultation bilaterally.  GI: Soft,  nontender, non-distended.   MS: No edema; No deformity. Neuro:  Alert; Aphasic.  Psych: Normal affect.  Labs    Chemistry Recent Labs  Lab 11/11/18 1130  11/13/18 0308 11/14/18 0539 11/15/18 0534  NA 139   < > 135 136 138  K 4.0   < > 3.7 4.0 4.4  CL 99   < > 97* 98 99  CO2 28   < > 28 30 30   GLUCOSE 347*   < > 236* 207* 169*  BUN 33*   < > 30* 30* 35*  CREATININE 1.65*   < > 1.21 1.28* 1.33*  CALCIUM 9.4   < > 8.9 9.1 9.0  PROT 7.3  --   --   --   --   ALBUMIN 4.1  --   --   --   --   AST 21  --   --   --   --   ALT 12  --   --   --   --   ALKPHOS 67  --   --   --   --   BILITOT 1.1  --   --   --   --   GFRNONAA 37*   < >  54* 50* 48*  GFRAA 43*   < > >60 58* 56*  ANIONGAP 12   < > 10 8 9    < > = values in this interval not displayed.     Hematology Recent Labs  Lab 11/12/18 0459 11/13/18 0308 11/14/18 0539  WBC 6.9 7.6 7.1  RBC 2.94* 2.97* 3.05*  HGB 9.0* 9.0* 9.2*  HCT 29.0* 28.2* 29.3*  MCV 98.6 94.9 96.1  MCH 30.6 30.3 30.2  MCHC 31.0 31.9 31.4  RDW 13.0 12.8 12.7  PLT 186 191 204    Cardiac Enzymes Recent Labs  Lab 11/11/18 1256 11/11/18 1706 11/11/18 2234 11/12/18 0459  TROPONINI 0.05* 0.05* 0.04* 0.05*   No results for input(s): TROPIPOC in the last 168 hours.   BNP Recent Labs  Lab 11/11/18 1130  BNP 472.0*     DDimer No results for input(s): DDIMER in the last 168 hours.   Radiology    No results found.  Cardiac Studies   2D Echo 11/12/2018: 1. The left ventricle has mild-moderately reduced systolic function, with an ejection fraction of 40-45%. The cavity size was normal. There is mildly increased left ventricular wall thickness. Left ventricular diastolic function could not be evaluated.  2. LVEF may be underestimated due to atrial flutter with variable AV conduction.  3. The right ventricle has moderately reduced systolic function. The cavity was normal. There is no increase in right ventricular wall thickness. Right ventricular  systolic pressure is moderately elevated with an estimated pressure of 54.9 mmHg.  4. Left atrial size was mildly dilated.  5. Right atrial size was mildly dilated.  6. The pericardial effusion is posterior to the left ventricle.  7. Trivial pericardial effusion is present.  8. The mitral valve is degenerative. There is mild mitral annular calcification present.  9. Tricuspid valve regurgitation is mild-moderate. 10. The aortic valve is tricuspid. Mild thickening of the aortic valve. Moderate calcification of the aortic valve. Aortic valve regurgitation is trivial by color flow Doppler. 11. There is mild dilatation of the aortic root measuring 38 mm. 12. The inferior vena cava was dilated in size with <50% respiratory variability.  Patient Profile     85 y.o. male with history of newly diagnosed Afib verus atypical atrial flutter with variable block, CAD, HFrEF, pulmonary hypertension, DM2, HTN, prior stroke with expressive aphasia and hemiparesis, SVT and frequent PVCs who we are seeing for Afib/flutter with RVR.   Assessment & Plan    1. Afib/atypical atrial flutter with variable AV block with RVR: -Ventricular rates remain poorly controlled in the 120s bpm -Overall, this is a difficult situation  -Give a one-time dose of IV digoxin 0.25 mg this morning -Reduce metoprolol to 37.5 mg q 6 hours given relative hypotension -Given his age and comorbid conditions, he is not a good candidate for TEE-guided DCCV -Continue Eliquis -Plan to pursue DCCV after he has been adequately anticoagulated  2. HFrEF/pulmonary hypertension: -He appears mildly volume up and with elevated right-side pressures on echo, likely exacerbated by his Afib/flutter -Hold Lasix given slight worsening of renal function (he has already received his dose this AM) -ARB stopped to allow for BP room to escalate rate controlling therapy   4. CAD with mildly elevated troponin: -Denies chest pain -On Eliquis in place of ASA  -Elevated troponin likely demand ischemia in the setting of volume overload, tachycardic rates, and AKI -No plans for inpatient ischemic evaluation at this time  5. HTN: -BP has been soft limiting the ability  to escalate rate control therapy as well as tapering and discontinuation of ARB -Monitor  6. Prior stroke: -Residual aphasia -Eliquis as above -Lipitor  7. AKI: -Renal function appears to be up trending recently with a baseline around 1 -Hold Lasix (already received dose this morning)  For questions or updates, please contact [place] Please consult [email or web address] for contact info under Cardiology/STEMI.    Signed, Christell Faith, PA-C [place] Pager: [phone] 11/15/2018, 8:50 AM

## 2018-11-15 NOTE — Progress Notes (Signed)
Patient has no acute event overnight. A&O X4, patient remained in asymptomatic ST in the 110-120 with SBP in the 90s. Patient received 573mL bolus .

## 2018-11-15 NOTE — Progress Notes (Signed)
Todd [name] at [place] NAME: Todd [name]    MR#:  [id number]  DATE OF BIRTH:  09/03/1930  SUBJECTIVE:  HR not controlled this am    REVIEW OF SYSTEMS:  Patient with previous CVA and has expressive aphasia.  Unable to obtain review of systems.    Tolerating Diet: yes      DRUG ALLERGIES:   Allergies  Allergen Reactions  . Amlodipine     Lower extremity swelling at 10 mg.  . Fluoxetine Other (See Comments)    headache  . Meloxicam Other (See Comments)    Other reaction(s): Dizziness Nervousness.     VITALS:  Blood pressure 111/79, pulse (!) 128, temperature (!) 97.4 F (36.3 C), temperature source Oral, resp. rate 19, height 5\' 7"  (1.702 m), weight 86.8 kg, SpO2 100 %.  PHYSICAL EXAMINATION:  Constitutional: Appears well-developed and well-nourished. No distress. HENT: Normocephalic. [name] Kitchen Oropharynx is clear and moist.  Eyes: Conjunctivae and EOM are normal. PERRLA, no scleral icterus.  Neck: Normal ROM. Neck supple. No JVD. No tracheal deviation. CVS: Irregular, irregular tachycardia no murmurs, no gallops, no carotid bruit.  Pulmonary: Effort and breath sounds normal, no stridor, rhonchi, wheezes, rales.  Abdominal: Soft. BS +,  no distension, tenderness, rebound or guarding.  Musculoskeletal: Normal range of motion. No edema and no tenderness.  Neuro: Alert.  Expressive aphasia from previous CVA   skin: Skin is warm and dry. No rash noted. Psychiatric: Normal mood and affect.      LABORATORY PANEL:   CBC Recent Labs  Lab 11/14/18 0539  WBC 7.1  HGB 9.2*  HCT 29.3*  PLT 204   ------------------------------------------------------------------------------------------------------------------  Chemistries  Recent Labs  Lab 11/11/18 1130  11/15/18 0534  NA 139   < > 138  K 4.0   < > 4.4  CL 99   < > 99  CO2 28   < > 30  GLUCOSE 347*   < > 169*  BUN 33*   < > 35*  CREATININE 1.65*   < > 1.33*  CALCIUM 9.4    < > 9.0  AST 21  --   --   ALT 12  --   --   ALKPHOS 67  --   --   BILITOT 1.1  --   --    < > = values in this interval not displayed.   ------------------------------------------------------------------------------------------------------------------  Cardiac Enzymes Recent Labs  Lab 11/11/18 1706 11/11/18 2234 11/12/18 0459  TROPONINI 0.05* 0.04* 0.05*   ------------------------------------------------------------------------------------------------------------------  RADIOLOGY:  No results found.   ASSESSMENT AND PLAN:   85 year old male with a history of CAD, diabetes, CVA with residual aphasia and hemiparesis who presents to the emergency room due to shortness of breath and tachycardia.   1.  Atrial fibrillation/flutter with PVCs:IMPRESSIONS  Echo shows  1. The left ventricle has mild-moderately reduced systolic function, with an ejection fraction of 40-45%. The cavity size was normal. There is mildly increased left ventricular wall thickness. Left ventricular diastolic function could not be evaluated. The mitral valve is degenerative. There is mild mitral annular calcification present.   One-time dose of digoxin this morning.   Reduced dose of metoprolol given hypotension.   Consideration for TEE guided cardioversion.   Continue Eliquis for CVA prevention.   2.  CAD: Continue aspirin, beta-blocker atorvastatin, Plavix  3.  History of CVA with residual deficit and expressive aphasia: Continue Eliquis and statin Dysphagia level 2 (MINCED foods) w/ Thin  liquids; general aspiration precautions including eating/drinking slowly and clearing mouth b/t bites of foods; Monitoring at meals for setup and any needs   4.  Essential hypertension: Continue Toprol entheses lower dose)  5.  Chronic systolic heart failure ejection fraction 40 to 45%: Holding Lasix due to mild increase in creatinine.   Monitor intake and output with daily weight. 6.  Acute kidney injury:  Creatinine slowly trending up. This is likely in the setting of atrial fibrillation with RVR and a mild component of ATN as well as Lasix. Hold Lasix for now and repeat BMP in a.m.   Management plans discussed with cardiology CODE STATUS: full  TOTAL TIME TAKING CARE OF THIS PATIENT: 24 minutes.     POSSIBLE D/C 1-2 days, DEPENDING ON CLINICAL CONDITION with HHC.   Bettey Costa M.D on 11/15/2018 at 10:47 AM  Between 7am to 6pm - Pager - [phone] After 6pm go to [email or web address] - password [email or web address] [facility]  Office  [phone]  CC: Primary care physician; Guadalupe Maple, MD  Note: This dictation was prepared with Dragon dictation along with smaller phrase technology. Any transcriptional errors that result from this process are unintentional.

## 2018-11-15 NOTE — Plan of Care (Signed)
  Problem: Education: Goal: Knowledge of General Education information will improve Description: Including pain rating scale, medication(s)/side effects and non-pharmacologic comfort measures Outcome: Progressing   Problem: Education: Goal: Understanding of medication regimen will improve Outcome: Progressing

## 2018-11-16 LAB — GLUCOSE, CAPILLARY
Glucose-Capillary: 110 mg/dL — ABNORMAL HIGH (ref 70–99)
Glucose-Capillary: 225 mg/dL — ABNORMAL HIGH (ref 70–99)
Glucose-Capillary: 170 mg/dL — ABNORMAL HIGH (ref 70–99)
Glucose-Capillary: 135 mg/dL — ABNORMAL HIGH (ref 70–99)

## 2018-11-16 LAB — CBC
HCT: 29.5 % — ABNORMAL LOW (ref 39.0–52.0)
Hemoglobin: 9.3 g/dL — ABNORMAL LOW (ref 13.0–17.0)
MCH: 30.6 pg (ref 26.0–34.0)
MCHC: 31.5 g/dL (ref 30.0–36.0)
MCV: 97 fL (ref 80.0–100.0)
Platelets: 207 10*3/uL (ref 150–400)
RBC: 3.04 MIL/uL — ABNORMAL LOW (ref 4.22–5.81)
RDW: 12.9 % (ref 11.5–15.5)
WBC: 7.9 10*3/uL (ref 4.0–10.5)
nRBC: 0 % (ref 0.0–0.2)

## 2018-11-16 LAB — BASIC METABOLIC PANEL
Anion gap: 9 (ref 5–15)
BUN: 35 mg/dL — ABNORMAL HIGH (ref 8–23)
CO2: 31 mmol/L (ref 22–32)
Calcium: 9.2 mg/dL (ref 8.9–10.3)
Chloride: 98 mmol/L (ref 98–111)
Creatinine, Ser: 1.3 mg/dL — ABNORMAL HIGH (ref 0.61–1.24)
GFR calc Af Amer: 57 mL/min — ABNORMAL LOW (ref >60)
GFR calc non Af Amer: 49 mL/min — ABNORMAL LOW (ref >60)
Glucose, Bld: 144 mg/dL — ABNORMAL HIGH (ref 70–99)
Potassium: 4.3 mmol/L (ref 3.5–5.1)
Sodium: 138 mmol/L (ref 135–145)

## 2018-11-16 LAB — CULTURE, BLOOD (ROUTINE X 2)
Culture: NO GROWTH
Culture: NO GROWTH
Special Requests: ADEQUATE

## 2018-11-16 MED ORDER — DOCUSATE SODIUM 100 MG PO CAPS
100.0000 mg | ORAL_CAPSULE | Freq: Two times a day (BID) | ORAL | Status: DC
  Administered 2018-11-16 – 2018-11-17 (×2): 100 mg via ORAL
  Filled 2018-11-16 (×2): qty 1

## 2018-11-16 MED ORDER — AMIODARONE HCL 200 MG PO TABS
200.0000 mg | ORAL_TABLET | Freq: Two times a day (BID) | ORAL | Status: DC
  Administered 2018-11-16 – 2018-11-17 (×3): 200 mg via ORAL
  Filled 2018-11-16 (×3): qty 1

## 2018-11-16 MED ORDER — PANTOPRAZOLE SODIUM 40 MG PO TBEC
40.0000 mg | DELAYED_RELEASE_TABLET | Freq: Every day | ORAL | Status: DC
  Administered 2018-11-16: 40 mg via ORAL
  Filled 2018-11-16: qty 1

## 2018-11-16 MED ORDER — SENNA 8.6 MG PO TABS
2.0000 | ORAL_TABLET | Freq: Every day | ORAL | Status: DC
  Administered 2018-11-16 – 2018-11-17 (×2): 17.2 mg via ORAL
  Filled 2018-11-16 (×2): qty 2

## 2018-11-16 NOTE — Progress Notes (Signed)
MD notified. Pt has no documented BM since June 5th. Pt is currently on miralax. MD orders senna and colace. I will continue to assess.

## 2018-11-16 NOTE — Progress Notes (Signed)
Progress Note  Patient Name: Todd [name] Date of Encounter: 11/16/2018  Primary Cardiologist: Rockey Situ  Subjective   He remains in Afib/flutter with RVR ,  Difficult to communicate Eating well Telemetry reviewed, elevated rate  Inpatient Medications    Scheduled Meds: . apixaban  5 mg Oral BID  . atorvastatin  10 mg Oral Daily  . digoxin  0.125 mg Oral Daily  . DULoxetine  60 mg Oral Daily  . gabapentin  100 mg Oral BID  . insulin aspart  0-20 Units Subcutaneous TID WC  . insulin aspart  0-5 Units Subcutaneous QHS  . insulin glargine  12 Units Subcutaneous Daily  . latanoprost  1 drop Both Eyes QHS  . magnesium oxide  400 mg Oral BID  . mouth rinse  15 mL Mouth Rinse BID  . metoprolol tartrate  37.5 mg Oral Q6H  . pantoprazole (PROTONIX) IV  40 mg Intravenous Q24H  . polyethylene glycol  17 g Oral Daily  . sodium chloride flush  3 mL Intravenous Q12H   Continuous Infusions: . sodium chloride Stopped (11/12/18 1142)   PRN Meds: sodium chloride, acetaminophen, albuterol, ondansetron **OR** ondansetron (ZOFRAN) IV, Resource ThickenUp Clear, sodium chloride flush   Vital Signs    Vitals:   11/16/18 0100 11/16/18 0200 11/16/18 0540 11/16/18 0738  BP: 124/71  111/75 111/76  Pulse: (!) 137 (!) 115 (!) 133 (!) 40  Resp: 16  16 19   Temp:   98.5 F (36.9 C) 98 F (36.7 C)  TempSrc:   Oral Oral  SpO2: 95%  100% 100%  Weight:   85.9 kg   Height:        Intake/Output Summary (Last 24 hours) at 11/16/2018 0947 Last data filed at 11/16/2018 0700 Gross per 24 hour  Intake -  Output 750 ml  Net -750 ml   Filed Weights   11/14/18 0500 11/15/18 0533 11/16/18 0540  Weight: 85.8 kg 86.8 kg 85.9 kg    Telemetry    Afib/flutter with RVR - Personally Reviewed  ECG    n/a - Personally Reviewed  Physical Exam   Constitutional:  oriented to person, place, and time. No distress.  HENT:  Head: Grossly normal Eyes:  no discharge. No scleral icterus.  Neck: No JVD,  no carotid bruits  Cardiovascular: Regular, tachycardic no murmurs appreciated Pulmonary/Chest: Clear to auscultation bilaterally, no wheezes or rails Abdominal: Soft.  no distension.  no tenderness.  Musculoskeletal: Normal range of motion Neurological:  normal muscle tone. Coordination normal. No atrophy Skin: Skin warm and dry Psychiatric: pleasant, relatively nonverbal   Labs    Chemistry Recent Labs  Lab 11/11/18 1130  11/14/18 0539 11/15/18 0534 11/16/18 0519  NA 139   < > 136 138 138  K 4.0   < > 4.0 4.4 4.3  CL 99   < > 98 99 98  CO2 28   < > 30 30 31   GLUCOSE 347*   < > 207* 169* 144*  BUN 33*   < > 30* 35* 35*  CREATININE 1.65*   < > 1.28* 1.33* 1.30*  CALCIUM 9.4   < > 9.1 9.0 9.2  PROT 7.3  --   --   --   --   ALBUMIN 4.1  --   --   --   --   AST 21  --   --   --   --   ALT 12  --   --   --   --  ALKPHOS 67  --   --   --   --   BILITOT 1.1  --   --   --   --   GFRNONAA 37*   < > 50* 48* 49*  GFRAA 43*   < > 58* 56* 57*  ANIONGAP 12   < > 8 9 9    < > = values in this interval not displayed.     Hematology Recent Labs  Lab 11/13/18 0308 11/14/18 0539 11/16/18 0519  WBC 7.6 7.1 7.9  RBC 2.97* 3.05* 3.04*  HGB 9.0* 9.2* 9.3*  HCT 28.2* 29.3* 29.5*  MCV 94.9 96.1 97.0  MCH 30.3 30.2 30.6  MCHC 31.9 31.4 31.5  RDW 12.8 12.7 12.9  PLT 191 204 207    Cardiac Enzymes Recent Labs  Lab 11/11/18 1256 11/11/18 1706 11/11/18 2234 11/12/18 0459  TROPONINI 0.05* 0.05* 0.04* 0.05*   No results for input(s): TROPIPOC in the last 168 hours.   BNP Recent Labs  Lab 11/11/18 1130  BNP 472.0*     DDimer No results for input(s): DDIMER in the last 168 hours.   Radiology    No results found.  Cardiac Studies   2D Echo 11/12/2018: 1. The left ventricle has mild-moderately reduced systolic function, with an ejection fraction of 40-45%. The cavity size was normal. There is mildly increased left ventricular wall thickness. Left ventricular diastolic  function could not be evaluated.  2. LVEF may be underestimated due to atrial flutter with variable AV conduction.  3. The right ventricle has moderately reduced systolic function. The cavity was normal. There is no increase in right ventricular wall thickness. Right ventricular systolic pressure is moderately elevated with an estimated pressure of 54.9 mmHg.  4. Left atrial size was mildly dilated.  5. Right atrial size was mildly dilated.  6. The pericardial effusion is posterior to the left ventricle.  7. Trivial pericardial effusion is present.  8. The mitral valve is degenerative. There is mild mitral annular calcification present.  9. Tricuspid valve regurgitation is mild-moderate. 10. The aortic valve is tricuspid. Mild thickening of the aortic valve. Moderate calcification of the aortic valve. Aortic valve regurgitation is trivial by color flow Doppler. 11. There is mild dilatation of the aortic root measuring 38 mm. 12. The inferior vena cava was dilated in size with <50% respiratory variability.  Patient Profile     85 y.o. male with history of newly diagnosed Afib verus atypical atrial flutter with variable block, CAD, HFrEF, pulmonary hypertension, DM2, HTN, prior stroke with expressive aphasia and hemiparesis, SVT and frequent PVCs who we are seeing for Afib/flutter with RVR.   Assessment & Plan    1. Afib/atypical atrial flutter with variable AV block with RVR: Difficult to control ventricular rates on high-dose metoprolol, digoxin added yesterday Medication titration limited by hypotension -Given his age and comorbid conditions, he is not a good candidate for TEE-guided DCCV -Continue Eliquis -Plan to pursue DCCV after he has been adequately anticoagulated in several weeks time -Few other options for rate control other than adding amiodarone oral dosing 200 twice daily with his metoprolol and digoxin.  2. HFrEF/pulmonary hypertension: elevated right-side pressures on echo,  likely exacerbated by his Afib/flutter -Using Lasix limited secondary to renal dysfunction and hypotension -ARB stopped to allow for BP room to escalate rate controlling therapy   4. CAD with mildly elevated troponin: -On Eliquis in place of ASA -Elevated troponin likely demand ischemia in the setting of volume overload, tachycardic rates,  and AKI -No plans for inpatient ischemic evaluation at this time  5. HTN: As above using high-dose metoprolol, ARB on hold  6. Prior stroke: -Residual aphasia -Eliquis as above -Lipitor  7. AKI: Renal function stable, creatinine on hold   Total encounter time more than 25 minutes  Greater than 50% was spent in counseling and coordination of care with the patient   For questions or updates, please contact [place] Please consult [email or web address] for contact info under Cardiology/STEMI.    Signed, Esmond Plants, MD, Ph.D [facility]

## 2018-11-16 NOTE — Progress Notes (Signed)
[place] at [place] NAME: Todd [name]    MR#:  [id number]  DATE OF BIRTH:  12/22/1930  SUBJECTIVE:  Heart rate still elevated    REVIEW OF SYSTEMS:  Patient with previous CVA and has expressive aphasia.  Unable to obtain review of systems.    Tolerating Diet: yes      DRUG ALLERGIES:   Allergies  Allergen Reactions  . Amlodipine     Lower extremity swelling at 10 mg.  . Fluoxetine Other (See Comments)    headache  . Meloxicam Other (See Comments)    Other reaction(s): Dizziness Nervousness.     VITALS:  Blood pressure 111/76, pulse (!) 114, temperature 98 F (36.7 C), temperature source Oral, resp. rate 19, height 5\' 7"  (1.702 m), weight 85.9 kg, SpO2 100 %.  PHYSICAL EXAMINATION:  Constitutional: Appears well-developed and well-nourished. No distress. HENT: Normocephalic. [name] Kitchen Oropharynx is clear and moist.  Eyes: Conjunctivae and EOM are normal. PERRLA, no scleral icterus.  Neck: Normal ROM. Neck supple. No JVD. No tracheal deviation. CVS: Irregular, irregular tachycardia no murmurs, no gallops, no carotid bruit.  Pulmonary: Effort and breath sounds normal, no stridor, rhonchi, wheezes, rales.  Abdominal: Soft. BS +,  no distension, tenderness, rebound or guarding.  Musculoskeletal: Normal range of motion. No edema and no tenderness.  Neuro: Alert.  Expressive aphasia from previous CVA   skin: Skin is warm and dry. No rash noted. Psychiatric: Normal mood and affect.      LABORATORY PANEL:   CBC Recent Labs  Lab 11/16/18 0519  WBC 7.9  HGB 9.3*  HCT 29.5*  PLT 207   ------------------------------------------------------------------------------------------------------------------  Chemistries  Recent Labs  Lab 11/11/18 1130  11/16/18 0519  NA 139   < > 138  K 4.0   < > 4.3  CL 99   < > 98  CO2 28   < > 31  GLUCOSE 347*   < > 144*  BUN 33*   < > 35*  CREATININE 1.65*   < > 1.30*  CALCIUM 9.4   < >  9.2  AST 21  --   --   ALT 12  --   --   ALKPHOS 67  --   --   BILITOT 1.1  --   --    < > = values in this interval not displayed.   ------------------------------------------------------------------------------------------------------------------  Cardiac Enzymes Recent Labs  Lab 11/11/18 1706 11/11/18 2234 11/12/18 0459  TROPONINI 0.05* 0.04* 0.05*   ------------------------------------------------------------------------------------------------------------------  RADIOLOGY:  No results found.   ASSESSMENT AND PLAN:   85 year old male with a history of CAD, diabetes, CVA with residual aphasia and hemiparesis who presents to the emergency room due to shortness of breath and tachycardia.   1.  Atrial fibrillation/flutter with PVCs:IMPRESSIONS  Echo shows  1. The left ventricle has mild-moderately reduced systolic function, with an ejection fraction of 40-45%. The cavity size was normal. There is mildly increased left ventricular wall thickness. Left ventricular diastolic function could not be evaluated. The mitral valve is degenerative. There is mild mitral annular calcification present.   Patient will be started on amiodarone Continue digoxin Continue Eliquis  2.  CAD: Continue aspirin, beta-blocker atorvastatin, Plavix  3.  History of CVA with residual deficit and expressive aphasia: Continue Eliquis and statin Dysphagia level 2 (MINCED foods) w/ Thin liquids; general aspiration precautions including eating/drinking slowly and clearing mouth b/t bites of foods; Monitoring at meals for setup and any  needs   4.  Essential hypertension: Continue Toprol entheses lower dose)  5.  Chronic systolic heart failure ejection fraction 40 to 45%: Holding Lasix due to mild increase in creatinine.   Monitor intake and output with daily weight. 6.  Acute kidney injury: Creatinine slowly trending up. This is likely in the setting of atrial fibrillation with RVR and a mild component  of ATN as well as Lasix. Renal function stable   Management plans discussed with cardiology CODE STATUS: full  TOTAL TIME TAKING CARE OF THIS PATIENT: 24 minutes.     POSSIBLE D/C 1-2 days, DEPENDING ON CLINICAL CONDITION with HHC.   Dustin Flock M.D on 11/16/2018 at 2:00 PM  Between 7am to 6pm - Pager - [phone] After 6pm go to [email or web address] - password [email or web address] [facility]  Office  [phone]  CC: Primary care physician; Guadalupe Maple, MD  Note: This dictation was prepared with Dragon dictation along with smaller phrase technology. Any transcriptional errors that result from this process are unintentional.

## 2018-11-16 NOTE — Plan of Care (Signed)
  Problem: Education: Goal: Understanding of medication regimen will improve Outcome: Progressing   Problem: Cardiac: Goal: Ability to achieve and maintain adequate cardiopulmonary perfusion will improve Outcome: Progressing   

## 2018-11-17 DIAGNOSIS — Z7189 Other specified counseling: Secondary | ICD-10-CM

## 2018-11-17 LAB — GLUCOSE, CAPILLARY
Glucose-Capillary: 196 mg/dL — ABNORMAL HIGH (ref 70–99)
Glucose-Capillary: 248 mg/dL — ABNORMAL HIGH (ref 70–99)

## 2018-11-17 MED ORDER — DIGOXIN 125 MCG PO TABS: 0.1250 mg | ORAL_TABLET | Freq: Every day | ORAL | 0 refills | Status: DC

## 2018-11-17 MED ORDER — ACCU-CHEK SOFT TOUCH LANCETS MISC: 12 refills | Status: AC

## 2018-11-17 MED ORDER — AMIODARONE HCL 200 MG PO TABS: 200.0000 mg | ORAL_TABLET | Freq: Two times a day (BID) | ORAL | 0 refills | Status: DC

## 2018-11-17 MED ORDER — BLOOD GLUCOSE MONITOR KIT: PACK | 0 refills | Status: AC

## 2018-11-17 MED ORDER — APIXABAN 5 MG PO TABS: 5.0000 mg | ORAL_TABLET | Freq: Two times a day (BID) | ORAL | 0 refills | Status: DC

## 2018-11-17 MED ORDER — FUROSEMIDE 20 MG PO TABS
20.0000 mg | ORAL_TABLET | Freq: Every day | ORAL | Status: DC
  Administered 2018-11-17: 20 mg via ORAL
  Filled 2018-11-17: qty 1

## 2018-11-17 MED ORDER — ACCU-CHEK AVIVA PLUS W/DEVICE KIT: PACK | 0 refills | Status: AC

## 2018-11-17 MED ORDER — METOPROLOL TARTRATE 75 MG PO TABS: 75.0000 mg | ORAL_TABLET | Freq: Two times a day (BID) | ORAL | 11 refills | Status: DC

## 2018-11-17 NOTE — Progress Notes (Signed)
Pt discharge home via wheelchair. Telemetry box removed and IV discontinued without incident. Reviewed home med list and discharge instructions with family at car.

## 2018-11-17 NOTE — Discharge Summary (Signed)
[facility] [place] [facility]  Todd [name], 85 y.o., DOB 10/06/1930, MRN [id number]. Admission date: 11/11/2018 Discharge Date 11/17/2018 Primary MD Crissman, Mark A, MD Admitting Physician Montell D Salary, MD  Admission Diagnosis  Atrial fibrillation with rapid ventricular response (HCC) [I48.91] B12 deficiency [E53.8]  Discharge Diagnosis   Active Problems:   Atrial fibrillation with rapid ventricular response (HCC) Coronary artery disease History of CVA Essential hypertension Chronic systolic heart failure Acute kidney injury   Hospital Course  Todd [name]  is a 85 y.o. male with a known history per below, history of atrial fibrillation per daughter-not on oral anticoagulation, presenting from oncology clinic/Dr. 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  Patient was admitted for further evaluation and therapy.  His heart rate was very poor to control.  Cardiology has been following the patient throughout hospitalization.  His medications have been adjusted.  He will be treated with anticoagulation prior to cardioversion as outpatient.  Patient cleared by cardiology for discharge today.  I have notified the daughter.  Patient with previous stroke prognosis very poor in terms of his advanced age.          Consults  cardiology  Significant Tests:  See full reports for all details     Ct Abdomen Pelvis Wo Contrast  Result Date: 11/11/2018 CLINICAL DATA:  Tachycardia and weakness EXAM: CT ABDOMEN AND PELVIS WITHOUT CONTRAST TECHNIQUE: Multidetector CT imaging of the abdomen and pelvis was performed following the standard protocol without IV contrast.  COMPARISON:  None. FINDINGS: Lower chest: Lung bases demonstrate no sizable infiltrate or effusion. Some scarring in the medial right lower lobe is noted. Hepatobiliary: Gallbladder is well distended. Some faint calcifications are noted along the inferior margin of the gallbladder which may represent adherent stones. The liver demonstrates a few calcified granulomas. Pancreas: Unremarkable. No pancreatic ductal dilatation or surrounding inflammatory changes. Spleen: Normal in size without focal abnormality. Adrenals/Urinary Tract: Adrenal glands are within normal limits. Scattered hypodensities are noted throughout both kidneys consistent with cysts but incompletely evaluated on today's exam. Nonobstructing renal stones are seen bilaterally measuring less than 5 mm. The bladder is well distended. Stomach/Bowel: Appendix is not well visualized although no inflammatory changes to suggest appendicitis are noted. No obstructive or inflammatory changes of the bowel are noted. Vascular/Lymphatic: Aortic atherosclerosis. No enlarged abdominal or pelvic lymph nodes. Reproductive: Multiple brachytherapy seeds are noted within the prostate Other: No abdominal wall hernia or abnormality. No abdominopelvic ascites. Musculoskeletal: Degenerative changes of the lumbar spine are noted. IMPRESSION: Nonobstructing renal stones bilaterally. Small faintly calcified gallstones. No acute abnormality is noted. Electronically Signed   By: Mark  Lukens M.D.   On: 11/11/2018 13:59   Dg Chest Portable 1 View  Result Date: 11/11/2018 CLINICAL DATA:  Generalized weakness, tachycardia EXAM: PORTABLE CHEST 1 VIEW COMPARISON:  12/17/2017, 09/18/2018 FINDINGS: There is chronic elevation of the right diaphragm. There is no focal parenchymal opacity. There is no pleural effusion or pneumothorax. There is stable cardiomegaly. There is severe bilateral glenohumeral osteoarthritis. IMPRESSION: No active disease. Electronically Signed   By: Hetal      On: 11/11/2018 13:31       Today   Subjective:   Todd   [name] patient chronically nonverbal due to stroke Objective:   Blood pressure 110/66, pulse (!) 132, temperature (!) 97.5 F (36.4 C), temperature source Oral, resp. rate 20, height 5' 7" (1.702 m), weight 85.9 kg, SpO2 100 %.  .  Intake/Output Summary (Last 24 hours) at 11/17/2018 1245 Last data filed at 11/17/2018 0734 Gross per 24 hour  Intake -  Output 725 ml  Net -725 ml    Exam VITAL SIGNS: Blood pressure 110/66, pulse (!) 132, temperature (!) 97.5 F (36.4 C), temperature source Oral, resp. rate 20, height 5' 7" (1.702 m), weight 85.9 kg, SpO2 100 %.  GENERAL:  85 y.o.-year-old patient lying in the bed with no acute distress.  EYES: Pupils equal, round, reactive to light and accommodation. No scleral icterus. Extraocular muscles intact.  HEENT: Head atraumatic, normocephalic. Oropharynx and nasopharynx clear.  NECK:  Supple, no jugular venous distention. No thyroid enlargement, no tenderness.  LUNGS: Normal breath sounds bilaterally, no wheezing, rales,rhonchi or crepitation. No use of accessory muscles of respiration.  CARDIOVASCULAR: S1, S2 normal. No murmurs, rubs, or gallops.  ABDOMEN: Soft, nontender, nondistended. Bowel sounds present. No organomegaly or mass.  EXTREMITIES: No pedal edema, cyanosis, or clubbing.  NEUROLOGIC: Cranial nerves II through XII are intact. Muscle strength 5/5 in all extremities. Sensation intact. Gait not checked.  PSYCHIATRIC: The patient is alert and oriented x 3.  SKIN: No obvious rash, lesion, or ulcer.   Data Review     CBC w Diff:  Lab Results  Component Value Date   WBC 7.9 11/16/2018   HGB 9.3 (L) 11/16/2018   HGB 9.5 (L) 05/10/2018   HCT 29.5 (L) 11/16/2018   HCT 27.9 (L) 05/10/2018   PLT 207 11/16/2018   PLT 189 05/10/2018   LYMPHOPCT 16 11/11/2018   MONOPCT 9 11/11/2018   EOSPCT 3 11/11/2018   BASOPCT 1 11/11/2018   CMP:  Lab Results  Component  Value Date   NA 138 11/16/2018   NA 145 (H) 05/09/2018   K 4.3 11/16/2018   CL 98 11/16/2018   CO2 31 11/16/2018   BUN 35 (H) 11/16/2018   BUN 15 05/09/2018   CREATININE 1.30 (H) 11/16/2018   PROT 7.3 11/11/2018   PROT 6.5 05/09/2018   ALBUMIN 4.1 11/11/2018   ALBUMIN 4.1 05/09/2018   BILITOT 1.1 11/11/2018   BILITOT 0.4 05/09/2018   ALKPHOS 67 11/11/2018   AST 21 11/11/2018   AST 27 09/26/2017   ALT 12 11/11/2018   ALT 19 09/26/2017  .  Micro Results Recent Results (from the past 240 hour(s))  Blood culture (routine x 2)     Status: None   Collection Time: 11/11/18 12:56 PM   Specimen: BLOOD RIGHT HAND  Result Value Ref Range Status   Specimen Description BLOOD RIGHT HAND  Final   Special Requests   Final    BOTTLES DRAWN AEROBIC AND ANAEROBIC Blood Culture adequate volume   Culture   Final    NO GROWTH 5 DAYS Performed at [facility], [address] [place], [place] [address]    Report Status 11/16/2018 FINAL  Final  SARS Coronavirus 2 (CEPHEID - Performed in [place] hospital lab), Hosp Order     Status: None   Collection Time: 11/11/18 12:56 PM   Specimen: Nasopharyngeal Swab  Result Value Ref Range Status   SARS Coronavirus 2 NEGATIVE NEGATIVE Final    Comment: (NOTE) If result is NEGATIVE SARS-CoV-2 target nucleic acids are NOT DETECTED. The SARS-CoV-2 RNA   is generally detectable in upper and lower  respiratory specimens during the acute phase of infection. The lowest  concentration of SARS-CoV-2 viral copies this assay can detect is 250  copies / mL. A negative result does not preclude SARS-CoV-2 infection  and should not be used as the sole basis for treatment or other  patient management decisions.  A negative result may occur with  improper specimen collection / handling, submission of specimen other  than nasopharyngeal swab, presence of viral mutation(s) within the  areas targeted by this assay, and inadequate number of viral copies   (<250 copies / mL). A negative result must be combined with clinical  observations, patient history, and epidemiological information. If result is POSITIVE SARS-CoV-2 target nucleic acids are DETECTED. The SARS-CoV-2 RNA is generally detectable in upper and lower  respiratory specimens dur ing the acute phase of infection.  Positive  results are indicative of active infection with SARS-CoV-2.  Clinical  correlation with patient history and other diagnostic information is  necessary to determine patient infection status.  Positive results do  not rule out bacterial infection or co-infection with other viruses. If result is PRESUMPTIVE POSTIVE SARS-CoV-2 nucleic acids MAY BE PRESENT.   A presumptive positive result was obtained on the submitted specimen  and confirmed on repeat testing.  While 2019 novel coronavirus  (SARS-CoV-2) nucleic acids may be present in the submitted sample  additional confirmatory testing may be necessary for epidemiological  and / or clinical management purposes  to differentiate between  SARS-CoV-2 and other Sarbecovirus currently known to infect humans.  If clinically indicated additional testing with an alternate test  methodology [phone]) is advised. The SARS-CoV-2 RNA is generally  detectable in upper and lower respiratory sp ecimens during the acute  phase of infection. The expected result is Negative. Fact Sheet for Patients:  StrictlyIdeas.no Fact Sheet for Healthcare Providers: BankingDealers.co.za This test is not yet approved or cleared by the [place] FDA and has been authorized for detection and/or diagnosis of SARS-CoV-2 by FDA under an Emergency Use Authorization (EUA).  This EUA will remain in effect (meaning this test can be used) for the duration of the COVID-19 declaration under Section 564(b)(1) of the Act, 21 U.S.C. section 360bbb-3(b)(1), unless the authorization is terminated  or revoked sooner. Performed at [facility], [address] [place], [place] [address]   Blood culture (routine x 2)     Status: None   Collection Time: 11/11/18  1:01 PM   Specimen: BLOOD LEFT ARM  Result Value Ref Range Status   Specimen Description BLOOD LEFT ARM  Final   Special Requests   Final    BOTTLES DRAWN AEROBIC AND ANAEROBIC Blood Culture results may not be optimal due to an excessive volume of blood received in culture bottles   Culture   Final    NO GROWTH 5 DAYS Performed at [facility], [address] [place], [place] [address]    Report Status 11/16/2018 FINAL  Final        Code Status Orders  (From admission, onward)         Start     Ordered   11/11/18 1817  Full code  Continuous     11/11/18 1816        Code Status History    Date Active Date Inactive Code Status Order ID Comments User Context   04/20/2018 2122 04/23/2018 1849 Full Code [id number]  Gladstone Lighter, MD Inpatient   05/18/2017 0303 05/21/2017  1928 Full Code [id number]  Tadarrius, Burch, MD Inpatient   03/17/2017 0419 03/17/2017 2109 Full Code [id number]  Saundra Shelling, MD ED   03/05/2015 0019 03/05/2015 1507 Full Code [id number]  Lance Coon, MD Inpatient   Advance Care Planning Activity          Follow-up Information    Guadalupe Maple, MD Follow up in 6 day(s).   Specialty: Family Medicine Contact information: [place] [address] [phone]        Minna Merritts, MD Follow up in 1 week(s).   Specialty: Cardiology Why: afib with rvr hosp f/u Contact information: [place] [address] [phone]           Discharge Medications   Allergies as of 11/17/2018      Reactions   Amlodipine    Lower extremity swelling at 10 mg.   Fluoxetine Other (See Comments)   headache   Meloxicam Other (See Comments)   Other reaction(s): Dizziness Nervousness.      Medication List    STOP taking  these medications   clopidogrel 75 MG tablet Commonly known as: PLAVIX   cyanocobalamin 1000 MCG/ML injection Commonly known as: (VITAMIN B-12)   hydrALAZINE 25 MG tablet Commonly known as: APRESOLINE   hydrochlorothiazide 25 MG tablet Commonly known as: HYDRODIURIL   propranolol ER 120 MG 24 hr capsule Commonly known as: INDERAL LA   telmisartan 80 MG tablet Commonly known as: MICARDIS     TAKE these medications   Accu-Chek Aviva Plus w/Device Kit Use to check sugar levels x 2 daily   accu-chek soft touch lancets Use as instructed   amiodarone 200 MG tablet Commonly known as: PACERONE Take 1 tablet (200 mg total) by mouth 2 (two) times daily.   apixaban 5 MG Tabs tablet Commonly known as: ELIQUIS Take 1 tablet (5 mg total) by mouth 2 (two) times daily.   aspirin EC 81 MG tablet Take 81 mg by mouth daily.   atorvastatin 10 MG tablet Commonly known as: LIPITOR Take 1 tablet (10 mg total) by mouth daily.   digoxin 0.125 MG tablet Commonly known as: LANOXIN Take 1 tablet (0.125 mg total) by mouth daily. Start taking on: November 18, 2018   DULoxetine 60 MG capsule Commonly known as: CYMBALTA Take 1 capsule (60 mg total) by mouth daily.   food thickener Powd Commonly known as: RESOURCE THICKENUP CLEAR Follow instructions on package for nectar consistency in all liquids   furosemide 20 MG tablet Commonly known as: LASIX Take 1 tablet (20 mg total) by mouth daily.   gabapentin 100 MG capsule Commonly known as: NEURONTIN Take 100 mg by mouth 2 (two) times daily. Once a day at night   latanoprost 0.005 % ophthalmic solution Commonly known as: XALATAN Place 1 drop into both eyes at bedtime.   magnesium oxide 400 MG tablet Commonly known as: MAG-OX Take 1 tablet (400 mg total) by mouth 2 (two) times daily.   metFORMIN 500 MG tablet Commonly known as: GLUCOPHAGE Take 1 tablet (500 mg total) by mouth daily with breakfast.   Metoprolol Tartrate 75 MG  Tabs Take 75 mg by mouth 2 (two) times daily.   naproxen sodium 220 MG tablet Commonly known as: ALEVE Take 220 mg by mouth 2 (two) times daily as needed.   pantoprazole 40 MG tablet Commonly known as: PROTONIX Take 1 tablet (40 mg total) by mouth daily.   Tylenol 8 Hour Arthritis Pain  650 MG CR tablet Generic drug: acetaminophen Take 650 mg by mouth every 8 (eight) hours as needed for pain.          Total Time in preparing paper work, data evaluation and todays exam - 66 minutes  Dustin Flock M.D on 11/17/2018 at 12:45 PM [place]  [phone]

## 2018-11-17 NOTE — Evaluation (Addendum)
Physical Therapy Evaluation Patient Details Name: Todd [name] MRN: [id number] DOB: [birth year] Today's Date: 11/17/2018   History of Present Illness  Julias Mould is an 85yo M who comes to [facility] on 6/8 direct from oncology with AF/ RVR. PMH includes expressie aphasia s/p CVA which complicates pt's ability to severe as a historian.  Clinical Impression  Pt admitted with above diagnosis. Pt currently with functional limitations due to the deficits listed below (see "PT Problem List"). Upon entry, pt in bed, awake and agreeable to participate. The pt is alert, pleasant, has expressive limitations from expressive aphasia s/p CVA, but makes great attempts to gesture and vocalize needs. Bed mobility performed with modified independence, STS and SPT with minA, tolerates standing for pericare, but always needs  physical assist for balance. Pt reports he does not AMB at home, uses a WC for mobility needs. Functional mobility assessment demonstrates increased effort/time requirements, poor tolerance, and need for physical assistance, whereas the patient performed these at a similar level of independence PTA. I suspect pt's balance is slightly worse than baseline, but fairly close. During this visit patient spends >25 minutes on [facility] for the purposes of having a bowel movement, lots of trunk movement and repositioning without LOB. Pt will benefit from skilled PT intervention to increase independence and safety with basic mobility in preparation for discharge to the venue listed below.       Follow Up Recommendations Home health PT    Equipment Recommendations  None recommended by PT    Recommendations for Other Services       Precautions / Restrictions Precautions Precautions: Fall Restrictions Weight Bearing Restrictions: No      Mobility  Bed Mobility Overal bed mobility: Modified Independent             General bed mobility comments: additional time/effot needed, but no physical assist  needed; no difficulty with seated balance at EOB  Transfers Overall transfer level: Needs assistance Equipment used: Rolling walker (2 wheeled);None Transfers: Risk manager;Sit to/from Stand Sit to Stand: Min assist(needs constant minA for balance and LUE support, but able to wipe his bottom for 2 minutes while standing.) Stand pivot transfers: Min assist(in a hurry to get to destination, limited following of cues)       General transfer comment: given multimodal cues for hand placement for dependent style SPT to [facility], which pt does not follow as he would rather rely on arm rails on BSC/bed rails. Pt is weak, and safety would be improved if cues were followed.  Ambulation/Gait Ambulation/Gait assistance: (Not extensively evaluated, pt reports he does not AMB at home, but uses a WC)              Stairs            Wheelchair Mobility    Modified Rankin (Stroke Patients Only)       Balance Overall balance assessment: Needs assistance Sitting-balance support: Feet supported;No upper extremity supported Sitting balance-Leahy Scale: Normal Sitting balance - Comments: lots of trunk movement for pericare on BSC   Standing balance support: During functional activity;Bilateral upper extremity supported Standing balance-Leahy Scale: Poor Standing balance comment: able to stand for 2 mintes for pericare, but needs UE support and minA for trunk support balance while wiping                             Pertinent Vitals/Pain Pain Assessment: No/denies pain    Home Living Family/patient  expects to be discharged to:: Private residence Living Arrangements: Children(DTR) Available Help at Discharge: Family   Home Access: [place]: One [place]: Environmental consultant - 2 wheels;Wheelchair - manual(will need to contact family for extensive DME list)      Prior Function Level of Independence: Needs assistance   Gait / Transfers  Assistance Needed: limited history 2/2 exprewssive aphasia, but pt reporrts SPT c RW to WC, and WC use for most in-home mobility.           Hand Dominance   Dominant Hand: Left    Extremity/Trunk Assessment   Upper Extremity Assessment Upper Extremity Assessment: Generalized weakness    Lower Extremity Assessment Lower Extremity Assessment: Generalized weakness       Communication   Communication: Expressive difficulties(chronic 2/2 CVA)  Cognition Arousal/Alertness: Awake/alert Behavior During Therapy: WFL for tasks assessed/performed Overall Cognitive Status: Difficult to assess                                 General Comments: pt does well with yes/no format; struggles to express needs using phonation and gestures; Pt follows commands when cued without issue.      General Comments      Exercises     Assessment/Plan    PT Assessment Patient needs continued PT services  PT Problem List Decreased strength;Decreased range of motion;Decreased activity tolerance;Decreased balance;Decreased mobility;Decreased coordination;Decreased cognition;Decreased safety awareness;Decreased knowledge of precautions       PT Treatment Interventions DME instruction;Balance training;Functional mobility training;Gait training;Patient/family education;Therapeutic activities;Therapeutic exercise    PT Goals (Current goals can be found in the Care Plan section)  Acute Rehab PT Goals PT Goal Formulation: Patient unable to participate in goal setting Time For Goal Achievement: 12/01/18    Frequency Min 2X/week   Barriers to discharge        Co-evaluation               AM-PAC PT "6 Clicks" Mobility  Outcome Measure Help needed turning from your back to your side while in a flat bed without using bedrails?: None Help needed moving from lying on your back to sitting on the side of a flat bed without using bedrails?: None Help needed moving to and from a bed to a  chair (including a wheelchair)?: A Little Help needed standing up from a chair using your arms (e.g., wheelchair or bedside chair)?: A Little Help needed to walk in hospital room?: A Lot Help needed climbing 3-5 steps with a railing? : Total 6 Click Score: 17    End of Session Equipment Utilized During Treatment: Oxygen Activity Tolerance: Patient tolerated treatment well Patient left: in chair;with call bell/phone within reach;with chair alarm set Nurse Communication: Mobility status PT Visit Diagnosis: Unsteadiness on feet (R26.81);Muscle weakness (generalized) (M62.81);Other abnormalities of gait and mobility (R26.89)    Time: 5784-6962 PT Time Calculation (min) (ACUTE ONLY): 57 min   Charges:   PT Evaluation $PT Eval Low Complexity: 1 Low PT Treatments $Therapeutic Exercise: 8-22 mins        1:10 PM, 11/17/18 Etta Grandchild, PT, DPT Physical Therapist - [facility]  [phone] ([place])    Meily Glowacki C 11/17/2018, 1:07 PM

## 2018-11-17 NOTE — Progress Notes (Signed)
Progress Note  Patient Name: Todd [name] Date of Encounter: 11/17/2018  Primary Cardiologist: Rockey Situ  Subjective   He remains in Afib/flutter  Rate is improved around 100 bpm Asymptomatic, sitting up in a chair eating well  Inpatient Medications    Scheduled Meds: . amiodarone  200 mg Oral BID  . apixaban  5 mg Oral BID  . atorvastatin  10 mg Oral Daily  . digoxin  0.125 mg Oral Daily  . docusate sodium  100 mg Oral BID  . DULoxetine  60 mg Oral Daily  . furosemide  20 mg Oral Daily  . gabapentin  100 mg Oral BID  . insulin aspart  0-20 Units Subcutaneous TID WC  . insulin aspart  0-5 Units Subcutaneous QHS  . insulin glargine  12 Units Subcutaneous Daily  . latanoprost  1 drop Both Eyes QHS  . magnesium oxide  400 mg Oral BID  . mouth rinse  15 mL Mouth Rinse BID  . metoprolol tartrate  37.5 mg Oral Q6H  . pantoprazole  40 mg Oral Daily  . polyethylene glycol  17 g Oral Daily  . senna  2 tablet Oral Daily  . sodium chloride flush  3 mL Intravenous Q12H   Continuous Infusions: . sodium chloride Stopped (11/12/18 1142)   PRN Meds: sodium chloride, acetaminophen, albuterol, ondansetron **OR** ondansetron (ZOFRAN) IV, Resource ThickenUp Clear, sodium chloride flush   Vital Signs    Vitals:   11/17/18 0503 11/17/18 0504 11/17/18 0733 11/17/18 0951  BP: 112/81  110/66   Pulse: (!) 54 (!) 110 91 (!) 132  Resp: 20     Temp: 98.3 F (36.8 C)  (!) 97.5 F (36.4 C)   TempSrc: Oral  Oral   SpO2: 100%  100%   Weight:      Height:        Intake/Output Summary (Last 24 hours) at 11/17/2018 1148 Last data filed at 11/17/2018 0734 Gross per 24 hour  Intake -  Output 725 ml  Net -725 ml   Filed Weights   11/14/18 0500 11/15/18 0533 11/16/18 0540  Weight: 85.8 kg 86.8 kg 85.9 kg    Telemetry    Afib/flutter with RVR - Personally Reviewed  ECG    n/a - Personally Reviewed  Physical Exam   Constitutional: Difficult time communicating, no distress.   HENT:  Head: Grossly normal Eyes:  no discharge. No scleral icterus.  Neck: No JVD, no carotid bruits  Cardiovascular: Irregularly irregular no murmurs appreciated Pulmonary/Chest: Clear to auscultation bilaterally, no wheezes or rails Abdominal: Soft.  no distension.  no tenderness.  Musculoskeletal: Normal range of motion Neurological:  normal muscle tone. Coordination normal. No atrophy Skin: Skin warm and dry Psychiatric: pleasant, relatively nonverbal   Labs    Chemistry Recent Labs  Lab 11/11/18 1130  11/14/18 0539 11/15/18 0534 11/16/18 0519  NA 139   < > 136 138 138  K 4.0   < > 4.0 4.4 4.3  CL 99   < > 98 99 98  CO2 28   < > 30 30 31   GLUCOSE 347*   < > 207* 169* 144*  BUN 33*   < > 30* 35* 35*  CREATININE 1.65*   < > 1.28* 1.33* 1.30*  CALCIUM 9.4   < > 9.1 9.0 9.2  PROT 7.3  --   --   --   --   ALBUMIN 4.1  --   --   --   --  AST 21  --   --   --   --   ALT 12  --   --   --   --   ALKPHOS 67  --   --   --   --   BILITOT 1.1  --   --   --   --   GFRNONAA 37*   < > 50* 48* 49*  GFRAA 43*   < > 58* 56* 57*  ANIONGAP 12   < > 8 9 9    < > = values in this interval not displayed.     Hematology Recent Labs  Lab 11/13/18 0308 11/14/18 0539 11/16/18 0519  WBC 7.6 7.1 7.9  RBC 2.97* 3.05* 3.04*  HGB 9.0* 9.2* 9.3*  HCT 28.2* 29.3* 29.5*  MCV 94.9 96.1 97.0  MCH 30.3 30.2 30.6  MCHC 31.9 31.4 31.5  RDW 12.8 12.7 12.9  PLT 191 204 207    Cardiac Enzymes Recent Labs  Lab 11/11/18 1256 11/11/18 1706 11/11/18 2234 11/12/18 0459  TROPONINI 0.05* 0.05* 0.04* 0.05*   No results for input(s): TROPIPOC in the last 168 hours.   BNP Recent Labs  Lab 11/11/18 1130  BNP 472.0*     DDimer No results for input(s): DDIMER in the last 168 hours.   Radiology    No results found.  Cardiac Studies   2D Echo 11/12/2018: 1. The left ventricle has mild-moderately reduced systolic function, with an ejection fraction of 40-45%. The cavity size was normal.  There is mildly increased left ventricular wall thickness. Left ventricular diastolic function could not be evaluated.  2. LVEF may be underestimated due to atrial flutter with variable AV conduction.  3. The right ventricle has moderately reduced systolic function. The cavity was normal. There is no increase in right ventricular wall thickness. Right ventricular systolic pressure is moderately elevated with an estimated pressure of 54.9 mmHg.  4. Left atrial size was mildly dilated.  5. Right atrial size was mildly dilated.  6. The pericardial effusion is posterior to the left ventricle.  7. Trivial pericardial effusion is present.  8. The mitral valve is degenerative. There is mild mitral annular calcification present.  9. Tricuspid valve regurgitation is mild-moderate. 10. The aortic valve is tricuspid. Mild thickening of the aortic valve. Moderate calcification of the aortic valve. Aortic valve regurgitation is trivial by color flow Doppler. 11. There is mild dilatation of the aortic root measuring 38 mm. 12. The inferior vena cava was dilated in size with <50% respiratory variability.  Patient Profile     85 y.o. male with history of newly diagnosed Afib verus atypical atrial flutter with variable block, CAD, HFrEF, pulmonary hypertension, DM2, HTN, prior stroke with expressive aphasia and hemiparesis, SVT and frequent PVCs who we are seeing for Afib/flutter with RVR.   Assessment & Plan    1. Afib/atypical atrial flutter with variable AV block with RVR: Difficult to control ventricular rates on high-dose metoprolol, low-dose digoxin added  Still waited rate, low-dose amiodarone added 200 twice daily with improved rate  -Given his age and comorbid conditions, he is not a good candidate for TEE-guided DCCV -Continue Eliquis 5 twice daily -Plan to pursue DCCV in 3-4 time We will add Lasix 20 mg daily given pulmonary hypertension on echo secondary to arrhythmia  2. HFrEF/pulmonary  hypertension: elevated right-side pressures on echo, likely exacerbated by his Afib/flutter -ARB stopped to allow for BP room to escalate rate controlling therapy  We will add Lasix 20  mg daily  4. CAD with mildly elevated troponin: -On Eliquis in place of ASA -Elevated troponin likely demand ischemia in the setting of volume overload, tachycardic rates, and AKI -No plans for inpatient ischemic evaluation at this time  5. HTN: As above using high-dose metoprolol, ARB on hold Discontinue ARB on discharge  6. Prior stroke: -Residual aphasia -Eliquis as above -Lipitor  7. AKI: Renal function stable,  Lasix 20 daily in the setting of arrhythmia   Total encounter time more than 25 minutes  Greater than 50% was spent in counseling and coordination of care with the patient   For questions or updates, please contact [place] Please consult [email or web address] for contact info under Cardiology/STEMI.    Signed, Esmond Plants, MD, Ph.D [facility]

## 2018-11-18 ENCOUNTER — Ambulatory Visit (INDEPENDENT_AMBULATORY_CARE_PROVIDER_SITE_OTHER): Payer: PPO | Admitting: Family Medicine

## 2018-11-18 ENCOUNTER — Telehealth: Payer: Self-pay | Admitting: Cardiovascular Disease

## 2018-11-18 ENCOUNTER — Other Ambulatory Visit: Payer: Self-pay

## 2018-11-18 ENCOUNTER — Other Ambulatory Visit: Payer: Self-pay | Admitting: Family Medicine

## 2018-11-18 ENCOUNTER — Encounter: Payer: Self-pay | Admitting: Family Medicine

## 2018-11-18 ENCOUNTER — Telehealth: Payer: PPO | Admitting: Cardiovascular Disease

## 2018-11-18 DIAGNOSIS — I4891 Unspecified atrial fibrillation: Secondary | ICD-10-CM

## 2018-11-18 DIAGNOSIS — E1169 Type 2 diabetes mellitus with other specified complication: Secondary | ICD-10-CM

## 2018-11-18 DIAGNOSIS — I6930 Unspecified sequelae of cerebral infarction: Secondary | ICD-10-CM

## 2018-11-18 DIAGNOSIS — R531 Weakness: Secondary | ICD-10-CM

## 2018-11-18 NOTE — Assessment & Plan Note (Signed)
Discussed care and treatment follow-up with cardiology

## 2018-11-18 NOTE — Telephone Encounter (Signed)
Pt c/o medication issue:  1. Name of Medication: Eliquis   2. How are you currently taking this medication (dosage and times per day)?  5 mg po BID  3. Are you having a reaction (difficulty breathing--STAT)? Confusion   4. What is your medication issue? Patient was kind of out of it yesterday after coming home from the hospital this has since resolved or improved but is this a reaction to this med

## 2018-11-18 NOTE — Assessment & Plan Note (Signed)
Patient essentially a phasic

## 2018-11-18 NOTE — Assessment & Plan Note (Signed)
Discussed combination of factors and will make referral to chronic care management to help with resources in patients complicated condition

## 2018-11-18 NOTE — Assessment & Plan Note (Signed)
Discussed blood sugars care and treatment

## 2018-11-18 NOTE — Progress Notes (Addendum)
There were no vitals taken for this visit.   Subjective:    Patient ID: Todd [name], male    DOB: 10/03/1930, 85 y.o.   MRN: [id number]  HPI: Todd [name] is a 85 y.o. male  Hospital follow-up  Discussed with family as patient is noncommunicative patient's current care and concerns. Family has concerned about coming home yesterday with profound weakness.  Checked his blood sugar and it was approximately 250.  Heart rate was about 130. Not sure what his blood pressure was. Patient since has settled down has not had any more spells like that but his heart rate has been elevated. Patient awaiting call from cardiology which will address further his heart rate. Patient taking Eliquis without any problems no bleeding or bruising. Discussed following fall risk and family states he does well with not falling.  Transition of Care Hospital Follow up.   Hospital/Facility:[facility] D/C Physician: Posey Pronto D/C Date: 11-17-18  Records Requested: na Records Received: na Records Reviewed: today  Diagnoses on Discharge: a fib  Date of interactive Contact within 48 hours of discharge: today Contact was through: phone  Date of 7 day or 14 day face-to-face visit:    11-18-18  Outpatient Encounter Medications as of 11/18/2018  Medication Sig Note  . acetaminophen (TYLENOL 8 HOUR ARTHRITIS PAIN) 650 MG CR tablet Take 650 mg by mouth every 8 (eight) hours as needed for pain. 08/07/2017: Patient reports taking 2 tablets twice daily   . amiodarone (PACERONE) 200 MG tablet Take 1 tablet (200 mg total) by mouth 2 (two) times daily.   [name] Kitchen apixaban (ELIQUIS) 5 MG TABS tablet Take 1 tablet (5 mg total) by mouth 2 (two) times daily.   [name] Kitchen aspirin EC 81 MG tablet Take 81 mg by mouth daily.   [name] Kitchen atorvastatin (LIPITOR) 10 MG tablet Take 1 tablet (10 mg total) by mouth daily.   . blood glucose meter kit and supplies KIT Dispense based on patient and insurance preference. Use up to four times daily as directed. (FOR ICD-9  250.00, 250.01).   . Blood Glucose Monitoring Suppl (ACCU-CHEK AVIVA PLUS) w/Device KIT Use to check sugar levels x 2 daily   . digoxin (LANOXIN) 0.125 MG tablet Take 1 tablet (0.125 mg total) by mouth daily.   . DULoxetine (CYMBALTA) 60 MG capsule Take 1 capsule (60 mg total) by mouth daily.   . food thickener (RESOURCE THICKENUP CLEAR) POWD Follow instructions on package for nectar consistency in all liquids   . furosemide (LASIX) 20 MG tablet Take 1 tablet (20 mg total) by mouth daily.   [name] Kitchen gabapentin (NEURONTIN) 100 MG capsule Take 100 mg by mouth 2 (two) times daily. Once a day at night   . Lancets (ACCU-CHEK SOFT TOUCH) lancets Use as instructed   . latanoprost (XALATAN) 0.005 % ophthalmic solution Place 1 drop into both eyes at bedtime.    . magnesium oxide (MAG-OX) 400 MG tablet Take 1 tablet (400 mg total) by mouth 2 (two) times daily.   . metFORMIN (GLUCOPHAGE) 500 MG tablet Take 1 tablet (500 mg total) by mouth daily with breakfast.   . metoprolol tartrate 75 MG TABS Take 75 mg by mouth 2 (two) times daily.   . naproxen sodium (ALEVE) 220 MG tablet Take 220 mg by mouth 2 (two) times daily as needed.  08/07/2017: Patient reports taking at 2 to 3 tablets a day.   . pantoprazole (PROTONIX) 40 MG tablet Take 1 tablet (40 mg total) by mouth daily.  No facility-administered encounter medications on file as of 11/18/2018.     Diagnostic Tests Reviewed/Disposition:  done  Consults:cardiology  Discharge Instructions reviewed with patients family  Disease/illness Education: Done with family  Home Health/Community Services Discussions/Referrals: Discussed chronic care management and will make referral  Establishment or re-establishment of referral orders for community resources: Will do  Discussion with other health care providers:na  Assessment and Support of treatment regimen adherence: Discussed  Appointments Coordinated with: na  Education for self-management, independent  living, and ADLs: Done with family    Relevant past medical, surgical, family and social history reviewed and updated as indicated. Interim medical history since our last visit reviewed. Allergies and medications reviewed and updated.  Review of Systems  Unable to perform ROS: Patient nonverbal    Per HPI unless specifically indicated above     Objective:    There were no vitals taken for this visit.  Wt Readings from Last 3 Encounters:  11/16/18 189 lb 6.4 oz (85.9 kg)  11/11/18 199 lb (90.3 kg)  07/08/18 198 lb (89.8 kg)    Physical Exam  Results for orders placed or performed during the hospital encounter of 11/11/18  Blood culture (routine x 2)   Specimen: BLOOD RIGHT HAND  Result Value Ref Range   Specimen Description BLOOD RIGHT HAND    Special Requests      BOTTLES DRAWN AEROBIC AND ANAEROBIC Blood Culture adequate volume   Culture      NO GROWTH 5 DAYS Performed at [facility], [address] [place], [place] [address]    Report Status 11/16/2018 FINAL   Blood culture (routine x 2)   Specimen: BLOOD LEFT ARM  Result Value Ref Range   Specimen Description BLOOD LEFT ARM    Special Requests      BOTTLES DRAWN AEROBIC AND ANAEROBIC Blood Culture results may not be optimal due to an excessive volume of blood received in culture bottles   Culture      NO GROWTH 5 DAYS Performed at [facility], [address] [place], [place] [address]    Report Status 11/16/2018 FINAL   SARS Coronavirus 2 (CEPHEID - Performed in [place] hospital lab), [facility] Order   Specimen: Nasopharyngeal Swab  Result Value Ref Range   SARS Coronavirus 2 NEGATIVE NEGATIVE  Troponin I - ONCE - STAT  Result Value Ref Range   Troponin I 0.05 (HH) <0.03 ng/mL  Protime-INR  Result Value Ref Range   Prothrombin Time 13.4 11.4 - 15.2 seconds   INR 1.0 0.8 - 1.2  Lactic acid, plasma  Result Value Ref Range   Lactic Acid, Venous 1.9 0.5 - 1.9 mmol/L  TSH  Result  Value Ref Range   TSH 0.936 0.350 - 4.500 uIU/mL  Hepatic function panel  Result Value Ref Range   Total Protein 7.3 6.5 - 8.1 g/dL   Albumin 4.1 3.5 - 5.0 g/dL   AST 21 15 - 41 U/L   ALT 12 0 - 44 U/L   Alkaline Phosphatase 67 38 - 126 U/L   Total Bilirubin 1.1 0.3 - 1.2 mg/dL   Bilirubin, Direct 0.2 0.0 - 0.2 mg/dL   Indirect Bilirubin 0.9 0.3 - 0.9 mg/dL  Troponin I - Now Then Q6H  Result Value Ref Range   Troponin I 0.05 (HH) <0.03 ng/mL  Troponin I - Now Then Q6H  Result Value Ref Range   Troponin I 0.04 (HH) <0.03 ng/mL  Troponin I - Now Then Q6H  Result Value Ref Range   Troponin I 0.05 (HH) <0.03 ng/mL  CBC  Result Value Ref Range   WBC 6.9 4.0 - 10.5 K/uL   RBC 2.94 (L) 4.22 - 5.81 MIL/uL   Hemoglobin 9.0 (L) 13.0 - 17.0 g/dL   HCT 29.0 (L) 39.0 - 52.0 %   MCV 98.6 80.0 - 100.0 fL   MCH 30.6 26.0 - 34.0 pg   MCHC 31.0 30.0 - 36.0 g/dL   RDW 13.0 11.5 - 15.5 %   Platelets 186 150 - 400 K/uL   nRBC 0.0 0.0 - 0.2 %  Basic metabolic panel  Result Value Ref Range   Sodium 137 135 - 145 mmol/L   Potassium 3.5 3.5 - 5.1 mmol/L   Chloride 100 98 - 111 mmol/L   CO2 28 22 - 32 mmol/L   Glucose, Bld 195 (H) 70 - 99 mg/dL   BUN 30 (H) 8 - 23 mg/dL   Creatinine, Ser 1.18 0.61 - 1.24 mg/dL   Calcium 8.7 (L) 8.9 - 10.3 mg/dL   GFR calc non Af Amer 56 (L) >60 mL/min   GFR calc Af Amer >60 >60 mL/min   Anion gap 9 5 - 15  Hemoglobin A1c  Result Value Ref Range   Hgb A1c MFr Bld 9.2 (H) 4.8 - 5.6 %   Mean Plasma Glucose 217.34 mg/dL  Glucose, capillary  Result Value Ref Range   Glucose-Capillary 234 (H) 70 - 99 mg/dL  Glucose, capillary  Result Value Ref Range   Glucose-Capillary 201 (H) 70 - 99 mg/dL  Glucose, capillary  Result Value Ref Range   Glucose-Capillary 236 (H) 70 - 99 mg/dL  Basic metabolic panel  Result Value Ref Range   Sodium 135 135 - 145 mmol/L   Potassium 3.7 3.5 - 5.1 mmol/L   Chloride 97 (L) 98 - 111 mmol/L   CO2 28 22 - 32 mmol/L   Glucose,  Bld 236 (H) 70 - 99 mg/dL   BUN 30 (H) 8 - 23 mg/dL   Creatinine, Ser 1.21 0.61 - 1.24 mg/dL   Calcium 8.9 8.9 - 10.3 mg/dL   GFR calc non Af Amer 54 (L) >60 mL/min   GFR calc Af Amer >60 >60 mL/min   Anion gap 10 5 - 15  CBC  Result Value Ref Range   WBC 7.6 4.0 - 10.5 K/uL   RBC 2.97 (L) 4.22 - 5.81 MIL/uL   Hemoglobin 9.0 (L) 13.0 - 17.0 g/dL   HCT 28.2 (L) 39.0 - 52.0 %   MCV 94.9 80.0 - 100.0 fL   MCH 30.3 26.0 - 34.0 pg   MCHC 31.9 30.0 - 36.0 g/dL   RDW 12.8 11.5 - 15.5 %   Platelets 191 150 - 400 K/uL   nRBC 0.0 0.0 - 0.2 %  Glucose, capillary  Result Value Ref Range   Glucose-Capillary 143 (H) 70 - 99 mg/dL  Glucose, capillary  Result Value Ref Range   Glucose-Capillary 164 (H) 70 - 99 mg/dL  APTT  Result Value Ref Range   aPTT 33 24 - 36 seconds  Protime-INR  Result Value Ref Range   Prothrombin Time 14.1 11.4 - 15.2 seconds   INR 1.1 0.8 - 1.2  Glucose, capillary  Result Value Ref Range   Glucose-Capillary 175 (H) 70 - 99 mg/dL  Glucose, capillary  Result Value Ref Range   Glucose-Capillary 220 (H) 70 - 99 mg/dL  Glucose, capillary  Result Value Ref Range   Glucose-Capillary  144 (H) 70 - 99 mg/dL  CBC  Result Value Ref Range   WBC 7.1 4.0 - 10.5 K/uL   RBC 3.05 (L) 4.22 - 5.81 MIL/uL   Hemoglobin 9.2 (L) 13.0 - 17.0 g/dL   HCT 29.3 (L) 39.0 - 52.0 %   MCV 96.1 80.0 - 100.0 fL   MCH 30.2 26.0 - 34.0 pg   MCHC 31.4 30.0 - 36.0 g/dL   RDW 12.7 11.5 - 15.5 %   Platelets 204 150 - 400 K/uL   nRBC 0.0 0.0 - 0.2 %  Glucose, capillary  Result Value Ref Range   Glucose-Capillary 192 (H) 70 - 99 mg/dL  Basic metabolic panel  Result Value Ref Range   Sodium 136 135 - 145 mmol/L   Potassium 4.0 3.5 - 5.1 mmol/L   Chloride 98 98 - 111 mmol/L   CO2 30 22 - 32 mmol/L   Glucose, Bld 207 (H) 70 - 99 mg/dL   BUN 30 (H) 8 - 23 mg/dL   Creatinine, Ser 1.28 (H) 0.61 - 1.24 mg/dL   Calcium 9.1 8.9 - 10.3 mg/dL   GFR calc non Af Amer 50 (L) >60 mL/min   GFR calc  Af Amer 58 (L) >60 mL/min   Anion gap 8 5 - 15  Glucose, capillary  Result Value Ref Range   Glucose-Capillary 206 (H) 70 - 99 mg/dL  Glucose, capillary  Result Value Ref Range   Glucose-Capillary 238 (H) 70 - 99 mg/dL  Basic metabolic panel  Result Value Ref Range   Sodium 138 135 - 145 mmol/L   Potassium 4.4 3.5 - 5.1 mmol/L   Chloride 99 98 - 111 mmol/L   CO2 30 22 - 32 mmol/L   Glucose, Bld 169 (H) 70 - 99 mg/dL   BUN 35 (H) 8 - 23 mg/dL   Creatinine, Ser 1.33 (H) 0.61 - 1.24 mg/dL   Calcium 9.0 8.9 - 10.3 mg/dL   GFR calc non Af Amer 48 (L) >60 mL/min   GFR calc Af Amer 56 (L) >60 mL/min   Anion gap 9 5 - 15  Glucose, capillary  Result Value Ref Range   Glucose-Capillary 161 (H) 70 - 99 mg/dL  Glucose, capillary  Result Value Ref Range   Glucose-Capillary 179 (H) 70 - 99 mg/dL  Glucose, capillary  Result Value Ref Range   Glucose-Capillary 144 (H) 70 - 99 mg/dL  Glucose, capillary  Result Value Ref Range   Glucose-Capillary 134 (H) 70 - 99 mg/dL  Glucose, capillary  Result Value Ref Range   Glucose-Capillary 178 (H) 70 - 99 mg/dL  Basic metabolic panel  Result Value Ref Range   Sodium 138 135 - 145 mmol/L   Potassium 4.3 3.5 - 5.1 mmol/L   Chloride 98 98 - 111 mmol/L   CO2 31 22 - 32 mmol/L   Glucose, Bld 144 (H) 70 - 99 mg/dL   BUN 35 (H) 8 - 23 mg/dL   Creatinine, Ser 1.30 (H) 0.61 - 1.24 mg/dL   Calcium 9.2 8.9 - 10.3 mg/dL   GFR calc non Af Amer 49 (L) >60 mL/min   GFR calc Af Amer 57 (L) >60 mL/min   Anion gap 9 5 - 15  CBC  Result Value Ref Range   WBC 7.9 4.0 - 10.5 K/uL   RBC 3.04 (L) 4.22 - 5.81 MIL/uL   Hemoglobin 9.3 (L) 13.0 - 17.0 g/dL   HCT 29.5 (L) 39.0 - 52.0 %   MCV 97.0  80.0 - 100.0 fL   MCH 30.6 26.0 - 34.0 pg   MCHC 31.5 30.0 - 36.0 g/dL   RDW 12.9 11.5 - 15.5 %   Platelets 207 150 - 400 K/uL   nRBC 0.0 0.0 - 0.2 %  Glucose, capillary  Result Value Ref Range   Glucose-Capillary 191 (H) 70 - 99 mg/dL  Glucose, capillary  Result  Value Ref Range   Glucose-Capillary 110 (H) 70 - 99 mg/dL   Comment 1 Notify RN    Comment 2 Document in Chart   Glucose, capillary  Result Value Ref Range   Glucose-Capillary 225 (H) 70 - 99 mg/dL   Comment 1 Notify RN    Comment 2 Document in Chart   Glucose, capillary  Result Value Ref Range   Glucose-Capillary 170 (H) 70 - 99 mg/dL   Comment 1 Notify RN    Comment 2 Document in Chart   Glucose, capillary  Result Value Ref Range   Glucose-Capillary 135 (H) 70 - 99 mg/dL  Glucose, capillary  Result Value Ref Range   Glucose-Capillary 196 (H) 70 - 99 mg/dL   Comment 1 Notify RN    Comment 2 Document in Chart   Glucose, capillary  Result Value Ref Range   Glucose-Capillary 248 (H) 70 - 99 mg/dL   Comment 1 Notify RN   ECHOCARDIOGRAM COMPLETE  Result Value Ref Range   Weight 3,017.6 oz   Height 67 in   BP 126/73 mmHg      Assessment & Plan:   Problem List Items Addressed This Visit      Cardiovascular and Mediastinum   Atrial fibrillation with rapid ventricular response [facility])    Discussed care and treatment follow-up with cardiology      Relevant Orders   Referral to Chronic Care Management Services     Endocrine   Diabetes mellitus associated with hormonal etiology ([place])    Discussed blood sugars care and treatment      Relevant Orders   Referral to Chronic Care Management Services     Other   History of stroke with residual deficit    Patient essentially a phasic      Relevant Orders   Referral to Chronic Care Management Services   Weakness    Discussed combination of factors and will make referral to chronic care management to help with resources in patients complicated condition      Relevant Orders   Referral to Chronic Care Management Services       Telemedicine using audio/video telecommunications for a synchronous communication visit. Today's visit due to COVID-19 isolation precautions I connected with and verified that I am speaking with  the correct person using two identifiers.   I discussed the limitations, risks, security and privacy concerns of performing an evaluation and management service by telecommunication and the availability of in person appointments. I also discussed with the patient that there may be a patient responsible charge related to this service. The patient expressed understanding and agreed to proceed. The patient's location is home. I am at home.   I discussed the assessment and treatment plan with the patient. The patient was provided an opportunity to ask questions and all were answered. The patient agreed with the plan and demonstrated an understanding of the instructions.   The patient was advised to call back or seek an in-person evaluation if the symptoms worsen or if the condition fails to improve as anticipated.   I provided 21+ minutes of time  during this encounter. Follow up plan: Return in about 4 weeks (around 12/16/2018).

## 2018-11-18 NOTE — Telephone Encounter (Signed)
Left message to call back  

## 2018-11-19 ENCOUNTER — Telehealth: Payer: Self-pay

## 2018-11-19 NOTE — Telephone Encounter (Signed)
Called patient to change appt to in office.  No answer. LMOV.   

## 2018-11-19 NOTE — Addendum Note (Signed)
Addended by: Golden Pop A on: 11/19/2018 08:41 AM   Modules accepted: Level of Service

## 2018-11-20 ENCOUNTER — Ambulatory Visit: Payer: Self-pay | Admitting: Licensed Clinical Social Worker

## 2018-11-20 ENCOUNTER — Telehealth: Payer: Self-pay

## 2018-11-20 NOTE — Chronic Care Management (AMB) (Signed)
  Chronic Care Management    Clinical Social Work Follow Up Note  11/20/2018 Name: Ryelan Kazee MRN: [id number] DOB: [birth year]  Rontavious Albright is a 85 y.o. year old male who is a primary care patient of Crissman, Jeannette How, MD. The CCM team was consulted for assistance. LCSW completed CCM outreach attempt today but was unable to reach patient successfully. A HIPPA compliant voice message was left encouraging patient to return call once available. LCSW rescheduled CCM SW appointment as well.  Review of patient status, including review of consultants reports, other relevant assessments, and collaboration with appropriate care team members and the patient's provider was performed as part of comprehensive patient evaluation and provision of chronic care management services.     Follow Up Plan: CCM team will follow up with patient within 2-4 weeks.   Eula Fried, BSW, MSW, [facility] [email or web address] Phone: [phone]

## 2018-11-20 NOTE — Telephone Encounter (Signed)
Spoke with patients daughter per release form and she states that he is fine now and they found out that his change was due to blood sugar changes. She reports that he is better now and no problems with the Eliquis. She did say that if we need to schedule or make any changes to please call [name] because he lives with her. She provided her number [phone] and will forward to scheduling so they are aware.

## 2018-11-22 ENCOUNTER — Ambulatory Visit: Payer: Self-pay | Admitting: Pharmacist

## 2018-11-22 ENCOUNTER — Telehealth: Payer: Self-pay

## 2018-11-22 ENCOUNTER — Telehealth: Payer: Self-pay | Admitting: Family Medicine

## 2018-11-22 NOTE — Patient Instructions (Addendum)
It was a pleasure speaking with you today! Please feel free to reach out with any future questions or concerns.    CCM (Chronic Care Management) Team   Janci Minor RN, BSN Nurse Care Coordinator  [phone]  Catie [facility] PharmD  Clinical Pharmacist  [phone]  Eula Fried LCSW Clinical Social Worker [phone]   Catie Darnelle Maffucci, PharmD Clinical Pharmacist [place] [phone]  The patient verbalized understanding of instructions provided today and declined a print copy of patient instruction materials.

## 2018-11-22 NOTE — Telephone Encounter (Signed)
He's been put on metoprolol rather than the propranolol. If he can't stop shaking and they are very concerned I'd recommend them go to the ER, otherwise they will need to discuss this with [name] on Monday.

## 2018-11-22 NOTE — Telephone Encounter (Signed)
Please call patients daughter normal to discuss shaking.

## 2018-11-22 NOTE — Chronic Care Management (AMB) (Signed)
  Chronic Care Management   Note  11/22/2018 Name: Todd [name] MRN: [id number] DOB: September 05, 1930  Todd [name] is a 85 y.o. year old male who is a primary care patient of Crissman, Jeannette How, MD. The CCM team was consulted for assistance with chronic disease management and care coordination needs.     Contacted patient's daughter, Todd [name] (on [place]) to discuss referral for chronic care management team and services provided. We discussed [name]' recent discharge and numerous medication changes. She declined needing to work with CCM team at this time, but requested our phone numbers if future concerns arise. She declined a medication review at this time, feeling confident about medication changes made at recent discharge. She also requested that I change [name]' preferred contact to her phone number, which I have done.   Will mail AVS with CCM team contact information to [name]/[name]. They will contact myself, CCM RN CM, or CCM LCSW with future questions or concers.   Catie Darnelle Maffucci, PharmD Clinical Pharmacist [place] [phone]

## 2018-11-22 NOTE — Telephone Encounter (Signed)
Patient daughter "[name]" called stating patient was admitted into the ED 6/8-6/14. [name] states DR at hospital took him off medication pantoprazole ([place]). Daughter states father hands are shaking more. And is confused why he was taken off medication. Can PCP or nurse call back and advise    Call back# [phone]

## 2018-11-22 NOTE — Telephone Encounter (Signed)
Patient daughter Todd [name] called back to say that patient need to get back on the propranolol ER (INDERAL LA) 24 hr capsule 120 mg   Per daughter patients hands are back to shaking excessively. Please call Todd [name] Ph# [phone]

## 2018-11-25 ENCOUNTER — Telehealth: Payer: Self-pay | Admitting: Cardiovascular Disease

## 2018-11-25 MED ORDER — PROPRANOLOL HCL ER 60 MG PO CP24: 60.0000 mg | ORAL_CAPSULE | Freq: Every day | ORAL | 5 refills | Status: DC

## 2018-11-25 NOTE — Telephone Encounter (Signed)
[name] [place] is the daughter who called.   Callback # [phone]

## 2018-11-25 NOTE — Telephone Encounter (Signed)
Pts daughter called and states she has questions regarding the medication choices. Pts daughter states she spoke with her sister and is requesting a call back.

## 2018-11-25 NOTE — Telephone Encounter (Signed)
-----   Message from Minna Merritts, MD sent at 11/17/2018  1:25 PM EDT ----- Needs TCM call on Tuesday Follow tcm appt  TG

## 2018-11-25 NOTE — Telephone Encounter (Signed)
TCM....     They saw Rockey Situ  They are scheduled to see Gollan 6/29     Please call Tuesday per [facility]

## 2018-11-26 DIAGNOSIS — S46912A Strain of unspecified muscle, fascia and tendon at shoulder and upper arm level, left arm, initial encounter: Secondary | ICD-10-CM | POA: Diagnosis not present

## 2018-11-26 NOTE — Telephone Encounter (Signed)
Attempted to call patient. LMTCB 11/26/2018

## 2018-11-26 NOTE — Telephone Encounter (Signed)
Patient daughter [name] returning call

## 2018-11-26 NOTE — Telephone Encounter (Signed)
Called and left a detailed message for patient's daughter.  

## 2018-11-26 NOTE — Telephone Encounter (Signed)
Spoke with patient's daughter [name], she states that when he was on the metoporol ER his shaking was better, but his recent metoprolol was not er, so she would like to know which medication do you think is better for his heart, the metoprolol or the propanolol, if it is metoprolol than they will need the er, they just want to know what you think is best

## 2018-11-26 NOTE — Telephone Encounter (Signed)
Patient contacted regarding discharge from hospital.   Patient understands to follow up with provider Dr. Rockey Situ on 6/29. Patient understands discharge instructions? yes Patient understands medications and regiment? yes Patient understands to bring all medications to this visit? Yes  Reviewed procedure for coming to clinic d/t CV 19 restrictions. Daughter voiced understanding.   Advised pt to call for any further questions or concerns.

## 2018-11-26 NOTE — Telephone Encounter (Signed)
This certainly is unclear messaging. Stick with propanolol for now and let me know which 1 is best.

## 2018-11-27 ENCOUNTER — Telehealth: Payer: Self-pay | Admitting: Family Medicine

## 2018-11-27 ENCOUNTER — Telehealth: Payer: Self-pay

## 2018-11-27 NOTE — Telephone Encounter (Signed)
Called and spoke with Nadara Mustard, we believe that it was for diabetic shoes, note does not mention anything about his feet or the need for diabetic shoes, so Nadara Mustard will reach back out to [place] if the patient contacts him to try and get diabetic shoes.

## 2018-11-27 NOTE — Telephone Encounter (Signed)
Nadara Mustard with [employer] calling because office notes were faxed on 06/18 but there was no information about what the patient needed to be supplied by them. Please call [phone].

## 2018-11-27 NOTE — Telephone Encounter (Signed)
.   In the past 7 to 10 days have you had a cough, shortness of breath, headache, congestion, fever (100 or greater) body aches, chills, sore throat, or sudden loss of taste or sense of smell? NO . Have you been around anyone with known Covid 19? NO . Have you been around anyone who is awaiting Covid 19 test results in the past 7 to 10 days? NO . Have you been around anyone who has been exposed to Covid 19, or has mentioned symptoms of Covid 19 within the past 7 to 10 days? NO . If you have any concerns/questions about symptoms patients report during screening (either on the phone or at threshold). Contact the provider seeing the patient or DOD for further guidance. If neither are available contact a member of the leadership team."    [place].

## 2018-11-28 MED ORDER — METOPROLOL SUCCINATE ER 25 MG PO TB24: 37.5000 mg | ORAL_TABLET | Freq: Every day | ORAL | 3 refills | Status: DC

## 2018-11-28 NOTE — Telephone Encounter (Signed)
Since it seems like he was doing better on the ER metoprolol, I will change him back to that. Please have them stop the propranolol and I've sent through the 37.5 ER metoprolol. Please schedule him an appointment to discuss issues with PCP ASAP.   If daughter would like I'm happy to get him a referral to see neurology about his tremors if they would like- just let me know and I'll add it in.

## 2018-11-28 NOTE — Telephone Encounter (Signed)
PTs daughter called stating pt is still expereincing trembling to the point where he cannot do regular daily activities. Pts daughter is requesting to set up an appt. Please advise Norma Knoebel [phone]

## 2018-11-28 NOTE — Telephone Encounter (Signed)
Message relayed to patient. Verbalized understanding and denied questions. OV w/ PCP scheduled.

## 2018-11-28 NOTE — Addendum Note (Signed)
Addended by: Valerie Roys on: 11/28/2018 11:48 AM   Modules accepted: Orders

## 2018-11-30 NOTE — Progress Notes (Signed)
Cardiology Office Note  Date:  12/02/2018   ID:  Todd [name], DOB [birth year], MRN [id number]  PCP:  Guadalupe Maple, MD   Chief Complaint  Patient presents with  . 2 month F/U    SOB with exertion    HPI:  Todd [name] is a 85 year old gentleman with past medical history of Coronary artery disease by catheterization 8-10 years ago Diabetes Hypertension Tremor GERD Hospitalization October 2018 for chest pain Hospitalization December 2018 for TIA Who presents for follow-up of his chest pain atrial fib with RVR,  TIA symptoms  In the hosp 11/2018 Afib/atypical atrial flutter with variable AV block with RVR: Difficult to control ventricular rates on high-dose metoprolol, low-dose digoxin added  Still waited rate, low-dose amiodarone added 200 twice daily with improved rate  -Given his age and comorbid conditions, he is not a good candidate for TEE-guided DCCV -Continue Eliquis 5 twice daily -Plan to pursue DCCV in 3-4 time We will add Lasix 20 mg daily given pulmonary hypertension on echo secondary to arrhythmia  2D Echo 11/12/2018: 1. The left ventricle has mild-moderately reduced systolic function, with an ejection fraction of 40-45%.   mild dilatation of the aortic root measuring 38 mm.  Family presents with him today He is relatively nonverbal, most of the history provided by family Presents in a wheelchair  Med confusion today concerning b-blockers Today on metoprolol succinate but family does not think this worked as well as metoprolol tartrate for his tremor Not on propranolol.  This also did not work well for his tremor They are requesting change back to metoprolol tartrate  Chronic shortness of breath for at least the past year per family They are uncertain if they would like cardioversion  EKG personally reviewed by myself on todays visit Shows atrial flutter with ventricular rate 79 bpm poor R wave progression to the anterior precordial leads, left axis  deviation  Other past medical history reviewed TIA 05/2017 Hospital records reviewed with the patient in detail Speech, difficulty getting his words out sometimes Echo and carotids showed nothing acute LDL 67,  on lipitor Seen by neurology  on ASA, Plavix and statin Skilled nursing facility, then had home PT and speech  Lab work reviewed in detail, testosterone of 9 PSA stable  Carotid <39% b/l  Echo 05/2017 Left ventricle: The cavity size was normal. There was moderate   concentric hypertrophy. Systolic function was normal. The   estimated ejection fraction was in the range of 55% to 60%. Wall   motion was normal; there were no regional wall motion   abnormalities. Doppler parameters are consistent with abnormal   left ventricular relaxation (grade 1 diastolic dysfunction). - Aortic valve: There was trivial regurgitation. - Mitral valve: Calcified annulus. - Left atrium: The atrium was mildly dilated.  Pharmacologic Myoview March 17, 2017 Results reviewed with him in detail today Blood pressure demonstrated a normal response to exercise.  There was no ST segment deviation noted during stress.  The left ventricular ejection fraction is mildly decreased (45-54%).  The study is normal.  This is a low risk study.     PMH:   has a past medical history of Anxiety, Aortic regurgitation, Arthritis, CAD (coronary artery disease), Depression, Diabetes mellitus without complication ([place]), Diastolic dysfunction, ED (erectile dysfunction), Elevated troponin, GERD (gastroesophageal reflux disease), Glaucoma, Hyperlipidemia, Hypertension, Iron deficiency anemia due to chronic blood loss, Left pontine stroke w/ cerebrovascular disease(HCC), Lumbago, and Prostate cancer ([place]).  PSH:    Past Surgical History:  Procedure Laterality Date  . CARPAL TUNNEL RELEASE    . REPLACEMENT TOTAL KNEE BILATERAL      Current Outpatient Medications  Medication Sig Dispense Refill  . acetaminophen  (TYLENOL 8 HOUR ARTHRITIS PAIN) 650 MG CR tablet Take 650 mg by mouth every 8 (eight) hours as needed for pain.    [name] Kitchen amiodarone (PACERONE) 200 MG tablet Take 1 tablet (200 mg total) by mouth 2 (two) times daily. 60 tablet 0  . apixaban (ELIQUIS) 5 MG TABS tablet Take 1 tablet (5 mg total) by mouth 2 (two) times daily. 60 tablet 0  . aspirin EC 81 MG tablet Take 81 mg by mouth daily.    [name] Kitchen atorvastatin (LIPITOR) 10 MG tablet Take 1 tablet (10 mg total) by mouth daily. 90 tablet 1  . blood glucose meter kit and supplies KIT Dispense based on patient and insurance preference. Use up to four times daily as directed. (FOR ICD-9 250.00, 250.01). 1 each 0  . Blood Glucose Monitoring Suppl (ACCU-CHEK AVIVA PLUS) w/Device KIT Use to check sugar levels x 2 daily 1 kit 0  . digoxin (LANOXIN) 0.125 MG tablet Take 1 tablet (0.125 mg total) by mouth daily. 30 tablet 0  . DULoxetine (CYMBALTA) 60 MG capsule Take 1 capsule (60 mg total) by mouth daily. 90 capsule 1  . food thickener (RESOURCE THICKENUP CLEAR) POWD Follow instructions on package for nectar consistency in all liquids 1000 g 12  . furosemide (LASIX) 20 MG tablet Take 1 tablet (20 mg total) by mouth daily. 30 tablet 4  . latanoprost (XALATAN) 0.005 % ophthalmic solution Place 1 drop into both eyes at bedtime.     . magnesium oxide (MAG-OX) 400 MG tablet Take 1 tablet (400 mg total) by mouth 2 (two) times daily. 60 tablet 6  . metFORMIN (GLUCOPHAGE) 500 MG tablet Take 1 tablet (500 mg total) by mouth daily with breakfast. 180 tablet 4  . metoprolol succinate (TOPROL-XL) 25 MG 24 hr tablet Take 1.5 tablets (37.5 mg total) by mouth daily. 45 tablet 3  . naproxen sodium (ALEVE) 220 MG tablet Take 220 mg by mouth 2 (two) times daily as needed.     [name] ULTRA test strip USE TO CHECK SUGAR LEVELS TWICE DAILY. 100 each 0  . pantoprazole (PROTONIX) 40 MG tablet Take 1 tablet (40 mg total) by mouth daily. 90 tablet 0  . propranolol ER (INDERAL LA) 60 MG 24  hr capsule Take 1 capsule (60 mg total) by mouth daily. 30 capsule 5  . gabapentin (NEURONTIN) 100 MG capsule Take 100 mg by mouth 2 (two) times daily. Once a day at night    . Lancets (ACCU-CHEK SOFT TOUCH) lancets Use as instructed 100 each 12   No current facility-administered medications for this visit.      Allergies:   Amlodipine, Fluoxetine, and Meloxicam   Social History:  The patient  reports that he quit smoking about 24 years ago. He has never used smokeless tobacco. He reports current alcohol use of about 1.0 standard drinks of alcohol per week. He reports that he does not use drugs.   Family History:   family history includes Hypertension in his father and mother; Stroke in his mother and sister.    Review of Systems: Review of Systems  Constitutional: Negative.   Respiratory: Positive for shortness of breath.   Cardiovascular: Negative.   Gastrointestinal: Negative.   Musculoskeletal: Negative.        Gait instability  Neurological: Positive  for tremors.  Psychiatric/Behavioral: Negative.   All other systems reviewed and are negative.   PHYSICAL EXAM: VS:  BP 140/70 (BP Location: Right Arm, Patient Position: Sitting, Cuff Size: Normal)   Pulse 79   Temp (!) 96.5 F (35.8 C)   Ht _0  (1.727 m)   Wt 196 lb 4 oz (89 kg)   SpO2 99% Comment: on 3L O2  BMI 29.84 kg/m  , BMI Body mass index is 29.84 kg/m.  Tremor noted GEN: Well nourished, well developed, in no acute distress  HEENT: normal  Neck: no JVD, carotid bruits, or masses Cardiac: Irregular no murmurs, rubs, or gallops,no edema  Respiratory:  clear to auscultation bilaterally, normal work of breathing GI: soft, nontender, nondistended, + BS MS: no deformity or atrophy  Skin: warm and dry, no rash Neuro:  Strength and sensation are intact Psych: euthymic mood, full affect  Recent Labs: 05/09/2018: Magnesium 1.3 11/11/2018: ALT 12; B Natriuretic Peptide 472.0; TSH 0.936 11/16/2018: BUN 35; Creatinine,  Ser 1.30; Hemoglobin 9.3; Platelets 207; Potassium 4.3; Sodium 138    Lipid Panel Lab Results  Component Value Date   CHOL 156 09/26/2017   HDL 40 08/23/2017   LDLCALC 75 08/23/2017   TRIG 156 (H) 09/26/2017      Wt Readings from Last 3 Encounters:  12/02/18 196 lb 4 oz (89 kg)  11/16/18 189 lb 6.4 oz (85.9 kg)  11/11/18 199 lb (90.3 kg)       ASSESSMENT AND PLAN:  Atrial flutter Very difficult to control rate while in the hospital, appears well controlled on today's visit We have offered cardioversion, they would like to think about it and will call us back Information has been provided If they declined cardioversion we will stop the amiodarone  Continue metoprolol .  They do not want metoprolol succinate do not want propranolol, prefer metoprolol tartrate as this seemed to help his tremor when he got home from the hospital Despite many medication changes and some confusion we will change back to metoprolol tartrate 100 mg twice daily for simplicity  Essential hypertension Change metoprolol to metoprolol tartrate 100 twice daily Continue other medications  Diabetes mellitus without complication ([place]) Managed by primary care  Imbalance Presenting in a wheelchair today, chronic leg weakness  Anxiety major issue in the past  on BuSpar     Total encounter time more than 25 minutes  Greater than 50% was spent in counseling and coordination of care with the patient    No orders of the defined types were placed in this encounter.    Signed, Esmond Plants, M.D., Ph.D. 12/02/2018  [place], [place]

## 2018-12-02 ENCOUNTER — Encounter: Payer: Self-pay | Admitting: Cardiovascular Disease

## 2018-12-02 ENCOUNTER — Other Ambulatory Visit: Payer: Self-pay

## 2018-12-02 ENCOUNTER — Ambulatory Visit (INDEPENDENT_AMBULATORY_CARE_PROVIDER_SITE_OTHER): Payer: PPO | Admitting: Cardiovascular Disease

## 2018-12-02 VITALS — BP 140/70 | HR 79 | Temp 96.5°F | Ht 68.0 in | Wt 196.2 lb

## 2018-12-02 DIAGNOSIS — E782 Mixed hyperlipidemia: Secondary | ICD-10-CM

## 2018-12-02 DIAGNOSIS — I5042 Chronic combined systolic (congestive) and diastolic (congestive) heart failure: Secondary | ICD-10-CM | POA: Diagnosis not present

## 2018-12-02 DIAGNOSIS — I2510 Atherosclerotic heart disease of native coronary artery without angina pectoris: Secondary | ICD-10-CM

## 2018-12-02 DIAGNOSIS — E119 Type 2 diabetes mellitus without complications: Secondary | ICD-10-CM | POA: Diagnosis not present

## 2018-12-02 DIAGNOSIS — I10 Essential (primary) hypertension: Secondary | ICD-10-CM

## 2018-12-02 DIAGNOSIS — I4819 Other persistent atrial fibrillation: Secondary | ICD-10-CM | POA: Diagnosis not present

## 2018-12-02 MED ORDER — FUROSEMIDE 20 MG PO TABS: 20.0000 mg | ORAL_TABLET | Freq: Every day | ORAL | 4 refills | Status: DC

## 2018-12-02 MED ORDER — METOPROLOL TARTRATE 100 MG PO TABS: 100.0000 mg | ORAL_TABLET | Freq: Two times a day (BID) | ORAL | 3 refills | Status: DC

## 2018-12-02 MED ORDER — METOPROLOL TARTRATE 75 MG PO TABS: 75.0000 mg | ORAL_TABLET | Freq: Two times a day (BID) | ORAL | 3 refills | Status: DC

## 2018-12-02 NOTE — Patient Instructions (Addendum)
Information on atrial flutter and cardioversion   Medication Instructions:  Your physician has recommended you make the following change in your medication:  1. STOP Metoprolol succinate 2. START Metoprolol tartrate 75 mg twice a day 3. STOP Propranolol 4. START Furosemide 20 mg once daily   If you need a refill on your cardiac medications before your next appointment, please call your pharmacy.    Lab work: No new labs needed   If you have labs (blood work) drawn today and your tests are completely normal, you will receive your results only by: [name] Kitchen MyChart Message (if you have MyChart) OR . A paper copy in the mail If you have any lab test that is abnormal or we need to change your treatment, we will call you to review the results.   Testing/Procedures: No new testing needed   Follow-Up: At [facility], you and your health needs are our priority.  As part of our continuing mission to provide you with exceptional heart care, we have created designated Provider Care Teams.  These Care Teams include your primary Cardiologist (physician) and Advanced Practice Providers (APPs -  Physician Assistants and Nurse Practitioners) who all work together to provide you with the care you need, when you need it.  . You will need a follow up appointment in 1 month  . Providers on your designated Care Team:   . Murray Hodgkins, NP . Christell Faith, PA-C . Marrianne Mood, PA-C  Any Other Special Instructions Will Be Listed Below (If Applicable).  For educational health videos Log in to : [email or web address] Or : [email or web address], password : [email or web address]   Electrical Cardioversion  Electrical cardioversion is the delivery of a jolt of electricity to restore a normal rhythm to the heart. A rhythm that is too fast or is not regular keeps the heart from pumping well. In this procedure, sticky patches or metal paddles are placed on the chest to deliver electricity to the heart from a device. This  procedure may be done in an emergency if:  There is low or no blood pressure as a result of the heart rhythm.  Normal rhythm must be restored as fast as possible to protect the brain and heart from further damage.  It may save a life. This procedure may also be done for irregular or fast heart rhythms that are not immediately life-threatening. Tell a health care provider about:  Any allergies you have.  All medicines you are taking, including vitamins, herbs, eye drops, creams, and over-the-counter medicines.  Any problems you or family members have had with anesthetic medicines.  Any blood disorders you have.  Any surgeries you have had.  Any medical conditions you have.  Whether you are pregnant or may be pregnant. What are the risks? Generally, this is a safe procedure. However, problems may occur, including:  Allergic reactions to medicines.  A blood clot that breaks free and travels to other parts of your body.  The possible return of an abnormal heart rhythm within hours or days after the procedure.  Your heart stopping (cardiac arrest). This is rare. What happens before the procedure? Medicines  Your health care provider may have you start taking: ? Blood-thinning medicines (anticoagulants) so your blood does not clot as easily. ? Medicines may be given to help stabilize your heart rate and rhythm.  Ask your health care provider about changing or stopping your regular medicines. This is especially important if you are taking diabetes medicines or blood thinners. General instructions  Plan to have someone take you home from the hospital or clinic.  If you will be going home right after the procedure, plan to have someone with you for 24 hours.  Follow instructions from your health care provider about eating or drinking restrictions. What happens during the procedure?  To lower your risk of infection: ? Your health care team will wash or sanitize their  hands. ? Your skin will be washed with soap.  An IV tube will be inserted into one of your veins.  You will be given a medicine to help you relax (sedative).  Sticky patches (electrodes) or metal paddles may be placed on your chest.  An electrical shock will be delivered. The procedure may vary among health care providers and hospitals. What happens after the procedure?   Your blood pressure, heart rate, breathing rate, and blood oxygen level will be monitored until the medicines you were given have worn off.  Do not drive for 24 hours if you were given a sedative.  Your heart rhythm will be watched to make sure it does not change. This information is not intended to replace advice given to you by your health care provider. Make sure you discuss any questions you have with your health care provider. Document Released: 05/12/2002 Document Revised: 05/04/2017 Document Reviewed: 11/26/2015 Elsevier Patient Education  2020 [place].  Cardiac Ablation  Cardiac ablation is a procedure to stop some heart tissue from causing problems. The heart has many electrical connections. Sometimes these connections make the heart beat very fast or irregularly. Removing some problem areas can improve the heart rhythm or make it normal. What happens before the procedure?  Follow instructions from your doctor about what you cannot eat or drink.  Ask your doctor about: ? Changing or stopping your normal medicines. This is important if you take diabetes medicines or blood thinners. ? Taking medicines such as aspirin and ibuprofen. These medicines can thin your blood. Do not take these medicines before your procedure if your doctor tells you not to.  Plan to have someone take you home.  If you will be going home right after the procedure, plan to have someone with you for 24 hours. What happens during the procedure?  To lower your risk of infection: ? Your health care team will wash or sanitize  their hands. ? Your skin will be washed with soap. ? Hair may be removed from your neck or groin.  An IV tube will be put into one of your veins.  You will be given a medicine to help you relax (sedative).  Skin on your neck or groin will be numbed.  A cut (incision) will be made in your neck or groin.  A needle will be put through your cut and into a vein in your neck or groin.  A tube (catheter) will be put into the needle. The tube will be moved to your heart. X-rays (fluoroscopy) will be used to help guide the tube.  Small devices (electrodes) on the tip of the tube will send out electrical currents.  Dye may be put through the tube. This helps your surgeon see your heart.  Electrical energy will be used to scar (ablate) some heart tissue. Your surgeon may use: ? Heat (radiofrequency energy). ? Laser energy. ? Extreme cold (cryoablation).  The tube will be taken out.  Pressure will be held on your cut. This helps stop bleeding.  A bandage (dressing) will be put on your cut. The procedure  may vary. What happens after the procedure?  You will be monitored until your medicines have worn off.  Your cut will be watched for bleeding. You will need to lie still for a few hours.  Do not drive for 24 hours or as long as your doctor tells you. Summary  Cardiac ablation is a procedure to stop some heart tissue from causing problems.  Electrical energy will be used to scar (ablate) some heart tissue. This information is not intended to replace advice given to you by your health care provider. Make sure you discuss any questions you have with your health care provider. Document Released: 01/22/2013 Document Revised: 05/04/2017 Document Reviewed: 04/10/2016 Elsevier Patient Education  2020 [employer].

## 2018-12-03 NOTE — Addendum Note (Signed)
Addended by: Raelene Bott, BRANDY L on: 12/03/2018 12:45 PM   Modules accepted: Orders

## 2018-12-03 NOTE — Therapy (Signed)
[facility] [address] [place], [place], [address] Phone: [phone]   Fax:  [phone]  Patient Details  Name: Todd [name] MRN: [id number] Date of Birth: 02/14/1931 Referring Provider:  Valerie Roys, DO  Encounter Date: 10/24/2017   Patient was not seen this date and chart was opened by Roxana Hires for chart review in preparation for session.  No charges associated with this date and note was run to remove chart from the open chart list.   Lovett,Amy 12/03/2018, 9:40 AM  [place], [place], [address] Phone: [phone]   Fax:  [phone]

## 2018-12-05 DIAGNOSIS — M25512 Pain in left shoulder: Secondary | ICD-10-CM | POA: Diagnosis not present

## 2018-12-09 ENCOUNTER — Telehealth: Payer: Self-pay | Admitting: Cardiovascular Disease

## 2018-12-09 NOTE — Telephone Encounter (Signed)
Pt c/o medication issue:  1. Name of Medication: metoprolol   2. How are you currently taking this medication (dosage and times per day)? 75 MG 2 times daily as needed   3. Are you having a reaction (difficulty breathing--STAT)? Not feeling good, hand shaking seems worse  4. What is your medication issue? Patient daughter calling.  Does not feel it is working and would like to discuss options.  Says that patient has been taken off this medication before due to issues.  Please call [phone] and if unable to reach okay to call sister [phone]

## 2018-12-10 ENCOUNTER — Telehealth: Payer: Self-pay

## 2018-12-10 ENCOUNTER — Telehealth: Payer: Self-pay | Admitting: Family Medicine

## 2018-12-10 DIAGNOSIS — R0902 Hypoxemia: Secondary | ICD-10-CM | POA: Diagnosis not present

## 2018-12-10 DIAGNOSIS — I501 Left ventricular failure: Secondary | ICD-10-CM | POA: Diagnosis not present

## 2018-12-10 DIAGNOSIS — I2510 Atherosclerotic heart disease of native coronary artery without angina pectoris: Secondary | ICD-10-CM | POA: Diagnosis not present

## 2018-12-10 NOTE — Telephone Encounter (Signed)
Please get scheduled with in person appointment ASAP

## 2018-12-10 NOTE — Telephone Encounter (Signed)
Pt scheduled  

## 2018-12-10 NOTE — Telephone Encounter (Signed)
Spoke to daughter. On 6/29 - Dr Rockey Situ switched patient from meto succinate to meto tartrate 75 mg two times a day. Since then, they feel the patient's tremor in left hand has worsened.  Patient is having a hard time eating with his left hand (he is left-handed). [place] is known to have history of tremors.  No other issues to report. Patient's BP and HR are stable per daughter. They think patient was better on the metoprolol succinate 37.5 mg daily.  Advised them to reach out to neurologist or PCP concerning worsening tremors as well. Will also route to Dr Rockey Situ to advise if he feels there is any correlation to to advise if patient should switch back to metoprolol succinate and what dose if so.

## 2018-12-10 NOTE — Telephone Encounter (Signed)
Pt daughter called and stated that the patients left hand is shaking very bad. Pt daughter would like to know what she should do. Please advise

## 2018-12-11 ENCOUNTER — Telehealth: Payer: Self-pay

## 2018-12-12 ENCOUNTER — Encounter: Payer: Self-pay | Admitting: Family Medicine

## 2018-12-12 ENCOUNTER — Other Ambulatory Visit: Payer: Self-pay

## 2018-12-12 ENCOUNTER — Ambulatory Visit (INDEPENDENT_AMBULATORY_CARE_PROVIDER_SITE_OTHER): Payer: PPO | Admitting: Family Medicine

## 2018-12-12 VITALS — BP 169/74 | HR 80

## 2018-12-12 DIAGNOSIS — E782 Mixed hyperlipidemia: Secondary | ICD-10-CM | POA: Diagnosis not present

## 2018-12-12 DIAGNOSIS — N183 Chronic kidney disease, stage 3 unspecified: Secondary | ICD-10-CM

## 2018-12-12 DIAGNOSIS — R4701 Aphasia: Secondary | ICD-10-CM | POA: Diagnosis not present

## 2018-12-12 DIAGNOSIS — I5042 Chronic combined systolic (congestive) and diastolic (congestive) heart failure: Secondary | ICD-10-CM

## 2018-12-12 DIAGNOSIS — C61 Malignant neoplasm of prostate: Secondary | ICD-10-CM | POA: Diagnosis not present

## 2018-12-12 DIAGNOSIS — I69359 Hemiplegia and hemiparesis following cerebral infarction affecting unspecified side: Secondary | ICD-10-CM

## 2018-12-12 DIAGNOSIS — E119 Type 2 diabetes mellitus without complications: Secondary | ICD-10-CM

## 2018-12-12 DIAGNOSIS — D631 Anemia in chronic kidney disease: Secondary | ICD-10-CM | POA: Diagnosis not present

## 2018-12-12 DIAGNOSIS — I6930 Unspecified sequelae of cerebral infarction: Secondary | ICD-10-CM

## 2018-12-12 DIAGNOSIS — E538 Deficiency of other specified B group vitamins: Secondary | ICD-10-CM

## 2018-12-12 DIAGNOSIS — G250 Essential tremor: Secondary | ICD-10-CM

## 2018-12-12 DIAGNOSIS — E1169 Type 2 diabetes mellitus with other specified complication: Secondary | ICD-10-CM

## 2018-12-12 DIAGNOSIS — I10 Essential (primary) hypertension: Secondary | ICD-10-CM

## 2018-12-12 LAB — BAYER DCA HB A1C WAIVED: HB A1C (BAYER DCA - WAIVED): 7.3 % — ABNORMAL HIGH (ref <7.0)

## 2018-12-12 MED ORDER — PANTOPRAZOLE SODIUM 40 MG PO TBEC: 40.0000 mg | DELAYED_RELEASE_TABLET | Freq: Every day | ORAL | 1 refills | Status: DC

## 2018-12-12 MED ORDER — DULOXETINE HCL 60 MG PO CPEP: 60.0000 mg | ORAL_CAPSULE | Freq: Every day | ORAL | 1 refills | Status: DC

## 2018-12-12 MED ORDER — METFORMIN HCL 500 MG PO TABS: 500.0000 mg | ORAL_TABLET | Freq: Every day | ORAL | 1 refills | Status: DC

## 2018-12-12 MED ORDER — DIGOXIN 125 MCG PO TABS: 0.1250 mg | ORAL_TABLET | Freq: Every day | ORAL | 1 refills | Status: DC

## 2018-12-12 MED ORDER — METOPROLOL SUCCINATE ER 100 MG PO TB24: 100.0000 mg | ORAL_TABLET | Freq: Every day | ORAL | 1 refills | Status: DC

## 2018-12-12 MED ORDER — ATORVASTATIN CALCIUM 10 MG PO TABS: 10.0000 mg | ORAL_TABLET | Freq: Every day | ORAL | 1 refills | Status: DC

## 2018-12-12 MED ORDER — GABAPENTIN 100 MG PO CAPS: 100.0000 mg | ORAL_CAPSULE | Freq: Two times a day (BID) | ORAL | 1 refills | Status: DC

## 2018-12-12 MED ORDER — APIXABAN 5 MG PO TABS: 5.0000 mg | ORAL_TABLET | Freq: Two times a day (BID) | ORAL | 1 refills | Status: DC

## 2018-12-12 NOTE — Progress Notes (Signed)
BP (!) 169/74   Pulse 80   SpO2 90% Comment: 4/L   Subjective:    Patient ID: Todd [name], male    DOB: 21-Aug-1930, 85 y.o.   MRN: [id number]  HPI: Todd [name] is a 85 y.o. male who presents today with his daughter for follow up.   Chief Complaint  Patient presents with  . Shaking    L arms. Worsening since last Hosptial visit. Family wondering if metoprolol dose change could be responsible.    Shaking- Never got the 37.5 ER metoprolol filled. Has not been taking his propranolol. He has been taking 75mg  BID and feels like the shaking is worse. Daughter states that the shaking was better on the ER medication than it was on the short acting. They would like to get him back on the ER.  HYPERTENSION / HYPERLIPIDEMIA Satisfied with current treatment? yes Duration of hypertension: chronic BP monitoring frequency: not checking BP medication side effects: no Past BP meds: metoprolol, lasix, digoxin Duration of hyperlipidemia: chronic Cholesterol medication side effects: no Cholesterol supplements: none Past cholesterol medications: atorvastatin Medication compliance: excellent compliance Aspirin: yes Recent stressors: no Recurrent headaches: no Visual changes: no Palpitations: no Dyspnea: no Chest pain: no Lower extremity edema: no Dizzy/lightheaded: no  DIABETES Hypoglycemic episodes:no Polydipsia/polyuria: no Visual disturbance: no Chest pain: no Paresthesias: no Glucose Monitoring: no  Accucheck frequency: Not Checking Taking Insulin?: no Blood Pressure Monitoring: not checking Retinal Examination: Up to Date Foot Exam: Up to Date Diabetic Education: Completed Pneumovax: Up to Date Influenza: Up to Date Aspirin: yes   Relevant past medical, surgical, family and social history reviewed and updated as indicated. Interim medical history since our last visit reviewed. Allergies and medications reviewed and updated.  Review of Systems  Constitutional: Negative.    Respiratory: Negative.   Cardiovascular: Negative.   Gastrointestinal: Negative.   Musculoskeletal: Negative.   Neurological: Positive for tremors and weakness. Negative for dizziness, seizures, syncope, facial asymmetry, speech difficulty, light-headedness, numbness and headaches.  Psychiatric/Behavioral: Negative.     Per HPI unless specifically indicated above     Objective:    BP (!) 169/74   Pulse 80   SpO2 90% Comment: 4/L  Wt Readings from Last 3 Encounters:  12/02/18 196 lb 4 oz (89 kg)  11/16/18 189 lb 6.4 oz (85.9 kg)  11/11/18 199 lb (90.3 kg)    Physical Exam Vitals signs and nursing note reviewed.  Constitutional:      General: He is not in acute distress.    Appearance: Normal appearance. He is not ill-appearing, toxic-appearing or diaphoretic.  HENT:     Head: Normocephalic and atraumatic.     Right Ear: External ear normal.     Left Ear: External ear normal.     Nose: Nose normal.     Mouth/Throat:     Mouth: Mucous membranes are moist.     Pharynx: Oropharynx is clear.  Eyes:     General: No scleral icterus.       Right eye: No discharge.        Left eye: No discharge.     Extraocular Movements: Extraocular movements intact.     Conjunctiva/sclera: Conjunctivae normal.     Pupils: Pupils are equal, round, and reactive to light.  Neck:     Musculoskeletal: Normal range of motion and neck supple.  Cardiovascular:     Rate and Rhythm: Normal rate and regular rhythm.     Pulses: Normal pulses.     Heart  sounds: Normal heart sounds. No murmur. No friction rub. No gallop.   Pulmonary:     Effort: Pulmonary effort is normal. No respiratory distress.     Breath sounds: Normal breath sounds. No stridor. No wheezing, rhonchi or rales.  Chest:     Chest wall: No tenderness.  Musculoskeletal: Normal range of motion.     Comments: Hemiplegia on the L  Skin:    General: Skin is warm and dry.     Capillary Refill: Capillary refill takes less than 2 seconds.      Coloration: Skin is not jaundiced or pale.     Findings: No bruising, erythema, lesion or rash.  Neurological:     General: No focal deficit present.     Mental Status: He is alert and oriented to person, place, and time. Mental status is at baseline.     Motor: Weakness present.     Coordination: Coordination abnormal.     Gait: Gait abnormal.  Psychiatric:        Mood and Affect: Mood normal.        Behavior: Behavior normal.        Thought Content: Thought content normal.        Judgment: Judgment normal.     Results for orders placed or performed in visit on 12/12/18  Microscopic Examination   URINE  Result Value Ref Range   WBC, UA None seen 0 - 5 /hpf   RBC None seen 0 - 2 /hpf   Epithelial Cells (non renal) 0-10 0 - 10 /hpf   Casts Present None seen /lpf   Cast Type Hyaline casts N/A   Bacteria, UA None seen None seen/Few  Bayer DCA Hb A1c Waived  Result Value Ref Range   HB A1C (BAYER DCA - WAIVED) 7.3 (H) <7.0 %  CBC with Differential/Platelet  Result Value Ref Range   WBC 7.1 3.4 - 10.8 x10E3/uL   RBC 3.31 (L) 4.14 - 5.80 x10E6/uL   Hemoglobin 10.0 (L) 13.0 - 17.7 g/dL   Hematocrit 30.8 (L) 37.5 - 51.0 %   MCV 93 79 - 97 fL   MCH 30.2 26.6 - 33.0 pg   MCHC 32.5 31.5 - 35.7 g/dL   RDW 12.5 11.6 - 15.4 %   Platelets 317 150 - 450 x10E3/uL   Neutrophils 69 Not Estab. %   Lymphs 15 Not Estab. %   Monocytes 11 Not Estab. %   Eos 2 Not Estab. %   Basos 1 Not Estab. %   Neutrophils Absolute 4.9 1.4 - 7.0 x10E3/uL   Lymphocytes Absolute 1.0 0.7 - 3.1 x10E3/uL   Monocytes Absolute 0.8 0.1 - 0.9 x10E3/uL   EOS (ABSOLUTE) 0.2 0.0 - 0.4 x10E3/uL   Basophils Absolute 0.1 0.0 - 0.2 x10E3/uL   Immature Granulocytes 2 Not Estab. %   Immature Grans (Abs) 0.2 (H) 0.0 - 0.1 x10E3/uL  Comprehensive metabolic panel  Result Value Ref Range   Glucose 215 (H) 65 - 99 mg/dL   BUN 13 8 - 27 mg/dL   Creatinine, Ser 1.23 0.76 - 1.27 mg/dL   GFR calc non Af Amer 53 (L) >59  mL/min/1.73   GFR calc Af Amer 61 >59 mL/min/1.73   BUN/Creatinine Ratio 11 10 - 24   Sodium 145 (H) 134 - 144 mmol/L   Potassium 4.0 3.5 - 5.2 mmol/L   Chloride 101 96 - 106 mmol/L   CO2 29 20 - 29 mmol/L   Calcium 9.5 8.6 - 10.2  mg/dL   Total Protein 6.8 6.0 - 8.5 g/dL   Albumin 4.1 3.6 - 4.6 g/dL   Globulin, Total 2.7 1.5 - 4.5 g/dL   Albumin/Globulin Ratio 1.5 1.2 - 2.2   Bilirubin Total 0.7 0.0 - 1.2 mg/dL   Alkaline Phosphatase 74 39 - 117 IU/L   AST 27 0 - 40 IU/L   ALT 12 0 - 44 IU/L  Lipid Panel w/o Chol/HDL Ratio  Result Value Ref Range   Cholesterol, Total 109 100 - 199 mg/dL   Triglycerides 110 0 - 149 mg/dL   HDL 39 (L) >39 mg/dL   VLDL Cholesterol Cal 22 5 - 40 mg/dL   LDL Calculated 48 0 - 99 mg/dL  TSH  Result Value Ref Range   TSH 1.400 0.450 - 4.500 uIU/mL  B12 and Folate Panel  Result Value Ref Range   Vitamin B-12 1,390 (H) 232 - 1,245 pg/mL   Folate >20.0 >3.0 ng/mL  VITAMIN D 25 Hydroxy (Vit-D Deficiency, Fractures)  Result Value Ref Range   Vit D, 25-Hydroxy 42.7 30.0 - 100.0 ng/mL  Microalbumin, Urine Waived  Result Value Ref Range   Microalb, Ur Waived 80 (H) 0 - 19 mg/L   Creatinine, Urine Waived 100 10 - 300 mg/dL   Microalb/Creat Ratio 30-300 (H) <30 mg/g  UA/M w/rflx Culture, Routine   Specimen: Urine   URINE  Result Value Ref Range   Specific Gravity, UA 1.015 1.005 - 1.030   pH, UA 7.0 5.0 - 7.5   Color, UA Yellow Yellow   Appearance Ur Clear Clear   Leukocytes,UA Negative Negative   Protein,UA Trace (A) Negative/Trace   Glucose, UA Trace (A) Negative   Ketones, UA Negative Negative   RBC, UA Negative Negative   Bilirubin, UA Negative Negative   Urobilinogen, Ur 4.0 (H) 0.2 - 1.0 mg/dL   Nitrite, UA Negative Negative   Microscopic Examination See below:       Assessment & Plan:   Problem List Items Addressed This Visit      Cardiovascular and Mediastinum   Hypertension    Not under good control. Will increase to 100mg   metoprolol ER and recheck in 2 weeks. Call with any concerns. Labs drawn today.      Relevant Medications   atorvastatin (LIPITOR) 10 MG tablet   digoxin (LANOXIN) 0.125 MG tablet   apixaban (ELIQUIS) 5 MG TABS tablet   metoprolol succinate (TOPROL-XL) 100 MG 24 hr tablet   Other Relevant Orders   Comprehensive metabolic panel (Completed)   Microalbumin, Urine Waived   Microalbumin, Urine Waived (Completed)   CHF (congestive heart failure) (HCC)    Euvolemic today. Continue to monitor. Call with any concerns.       Relevant Medications   atorvastatin (LIPITOR) 10 MG tablet   digoxin (LANOXIN) 0.125 MG tablet   apixaban (ELIQUIS) 5 MG TABS tablet   metoprolol succinate (TOPROL-XL) 100 MG 24 hr tablet   Other Relevant Orders   Comprehensive metabolic panel (Completed)   Ambulatory referral to Home Health     Endocrine   Diabetes mellitus associated with hormonal etiology ([place])    Improved with A1c of 7.8 down from 9.2 Continue current regimen. Continue to monitor. Call with any concerns. Refills given.       Relevant Medications   atorvastatin (LIPITOR) 10 MG tablet   metFORMIN (GLUCOPHAGE) 500 MG tablet   Other Relevant Orders   Bayer DCA Hb A1c Waived (Completed)   UA/M w/rflx Culture, Routine  Ambulatory referral to [place]   UA/M w/rflx Culture, Routine (Completed)     Nervous and Auditory   Benign essential tremor    Worsening. Offered referral to neurology- they would like to hold on this for a bit. Daughter states that he did better on ER metoprolol. Will will change back to that and recheck 2 weeks. Call with any concerns.       Relevant Orders   TSH (Completed)   VITAMIN D 25 Hydroxy (Vit-D Deficiency, Fractures) (Completed)   Hemiplegia (HCC)    Stable. We will get home health in to see if they can help.       Relevant Orders   Ambulatory referral to [place]     Genitourinary   Prostate cancer [facility])    Checking PSA today. Await results. Call  with any concerns.       Relevant Orders   Ambulatory referral to Home Health   Anemia due to stage 3 chronic kidney disease ([place])    Rechecking labs today. Await results. Call with any concerns.       Relevant Orders   CBC with Differential/Platelet (Completed)     Other   Aphasia    Stable. We will get home health in to see if they can help.       Relevant Orders   Ambulatory referral to Home Health   History of stroke with residual deficit    Stable. We will get home health in to see if they can help.       Relevant Orders   Ambulatory referral to Home Health   Mixed hyperlipidemia    Under good control on current regimen. Continue current regimen. Continue to monitor. Call with any concerns. Refills given. Labs drawn today.       Relevant Medications   atorvastatin (LIPITOR) 10 MG tablet   digoxin (LANOXIN) 0.125 MG tablet   apixaban (ELIQUIS) 5 MG TABS tablet   metoprolol succinate (TOPROL-XL) 100 MG 24 hr tablet   Other Relevant Orders   Lipid Panel w/o Chol/HDL Ratio (Completed)   B12 deficiency - Primary    Rechecking labs today. Await results. Treat as needed.       Relevant Orders   CBC with Differential/Platelet (Completed)   Comprehensive metabolic panel (Completed)   B12 and Folate Panel (Completed)    Other Visit Diagnoses    Diabetes mellitus without complication ([place])       Relevant Medications   atorvastatin (LIPITOR) 10 MG tablet   metFORMIN (GLUCOPHAGE) 500 MG tablet   Other Relevant Orders   Ambulatory referral to [place]       Follow up plan: Return in about 2 weeks (around 12/26/2018) for follow up.

## 2018-12-13 ENCOUNTER — Telehealth: Payer: Self-pay

## 2018-12-13 ENCOUNTER — Other Ambulatory Visit: Payer: PPO

## 2018-12-13 DIAGNOSIS — I10 Essential (primary) hypertension: Secondary | ICD-10-CM | POA: Diagnosis not present

## 2018-12-13 DIAGNOSIS — E1169 Type 2 diabetes mellitus with other specified complication: Secondary | ICD-10-CM | POA: Diagnosis not present

## 2018-12-13 LAB — CBC WITH DIFFERENTIAL/PLATELET
Basophils Absolute: 0.1 10*3/uL (ref 0.0–0.2)
Basos: 1 %
EOS (ABSOLUTE): 0.2 10*3/uL (ref 0.0–0.4)
Eos: 2 %
Hematocrit: 30.8 % — ABNORMAL LOW (ref 37.5–51.0)
Hemoglobin: 10 g/dL — ABNORMAL LOW (ref 13.0–17.7)
Immature Grans (Abs): 0.2 10*3/uL — ABNORMAL HIGH (ref 0.0–0.1)
Immature Granulocytes: 2 %
Lymphocytes Absolute: 1 10*3/uL (ref 0.7–3.1)
Lymphs: 15 %
MCH: 30.2 pg (ref 26.6–33.0)
MCHC: 32.5 g/dL (ref 31.5–35.7)
MCV: 93 fL (ref 79–97)
Monocytes Absolute: 0.8 10*3/uL (ref 0.1–0.9)
Monocytes: 11 %
Neutrophils Absolute: 4.9 10*3/uL (ref 1.4–7.0)
Neutrophils: 69 %
Platelets: 317 10*3/uL (ref 150–450)
RBC: 3.31 x10E6/uL — ABNORMAL LOW (ref 4.14–5.80)
RDW: 12.5 % (ref 11.6–15.4)
WBC: 7.1 10*3/uL (ref 3.4–10.8)

## 2018-12-13 LAB — UA/M W/RFLX CULTURE, ROUTINE
Bilirubin, UA: NEGATIVE
Ketones, UA: NEGATIVE
Leukocytes,UA: NEGATIVE
Nitrite, UA: NEGATIVE
RBC, UA: NEGATIVE
Specific Gravity, UA: 1.015 (ref 1.005–1.030)
Urobilinogen, Ur: 4 mg/dL — ABNORMAL HIGH (ref 0.2–1.0)
pH, UA: 7 (ref 5.0–7.5)

## 2018-12-13 LAB — B12 AND FOLATE PANEL
Folate: >20 ng/mL (ref >3.0)
Vitamin B-12: 1390 pg/mL — ABNORMAL HIGH (ref 232–1245)

## 2018-12-13 LAB — COMPREHENSIVE METABOLIC PANEL
ALT: 12 IU/L (ref 0–44)
AST: 27 IU/L (ref 0–40)
Albumin/Globulin Ratio: 1.5 (ref 1.2–2.2)
Albumin: 4.1 g/dL (ref 3.6–4.6)
Alkaline Phosphatase: 74 IU/L (ref 39–117)
BUN/Creatinine Ratio: 11 (ref 10–24)
BUN: 13 mg/dL (ref 8–27)
Bilirubin Total: 0.7 mg/dL (ref 0.0–1.2)
CO2: 29 mmol/L (ref 20–29)
Calcium: 9.5 mg/dL (ref 8.6–10.2)
Chloride: 101 mmol/L (ref 96–106)
Creatinine, Ser: 1.23 mg/dL (ref 0.76–1.27)
GFR calc Af Amer: 61 mL/min/{1.73_m2} (ref >59)
GFR calc non Af Amer: 53 mL/min/{1.73_m2} — ABNORMAL LOW (ref >59)
Globulin, Total: 2.7 g/dL (ref 1.5–4.5)
Glucose: 215 mg/dL — ABNORMAL HIGH (ref 65–99)
Potassium: 4 mmol/L (ref 3.5–5.2)
Sodium: 145 mmol/L — ABNORMAL HIGH (ref 134–144)
Total Protein: 6.8 g/dL (ref 6.0–8.5)

## 2018-12-13 LAB — VITAMIN D 25 HYDROXY (VIT D DEFICIENCY, FRACTURES): Vit D, 25-Hydroxy: 42.7 ng/mL (ref 30.0–100.0)

## 2018-12-13 LAB — LIPID PANEL W/O CHOL/HDL RATIO
Cholesterol, Total: 109 mg/dL (ref 100–199)
HDL: 39 mg/dL — ABNORMAL LOW (ref >39)
LDL Calculated: 48 mg/dL (ref 0–99)
Triglycerides: 110 mg/dL (ref 0–149)
VLDL Cholesterol Cal: 22 mg/dL (ref 5–40)

## 2018-12-13 LAB — MICROSCOPIC EXAMINATION
Bacteria, UA: NONE SEEN
RBC, Urine: NONE SEEN /hpf (ref 0–2)
WBC, UA: NONE SEEN /hpf (ref 0–5)

## 2018-12-13 LAB — MICROALBUMIN, URINE WAIVED
Creatinine, Urine Waived: 100 mg/dL (ref 10–300)
Microalb, Ur Waived: 80 mg/L — ABNORMAL HIGH (ref 0–19)

## 2018-12-13 LAB — TSH: TSH: 1.4 u[IU]/mL (ref 0.450–4.500)

## 2018-12-14 NOTE — Telephone Encounter (Signed)
They can switch back They have tried several b-blockers with no relief I did mention on the last call that I did not think the b-blocker was causing or would help his tremor, it is likely neurologic

## 2018-12-15 ENCOUNTER — Encounter: Payer: Self-pay | Admitting: Family Medicine

## 2018-12-15 NOTE — Assessment & Plan Note (Signed)
Stable. We will get home health in to see if they can help.

## 2018-12-15 NOTE — Assessment & Plan Note (Signed)
Rechecking labs today. Await results. Treat as needed.  °

## 2018-12-15 NOTE — Assessment & Plan Note (Signed)
Euvolemic today. Continue to monitor. Call with any concerns.  

## 2018-12-15 NOTE — Assessment & Plan Note (Signed)
Checking PSA today. Await results. Call with any concerns.

## 2018-12-15 NOTE — Assessment & Plan Note (Signed)
Not under good control. Will increase to 100mg  metoprolol ER and recheck in 2 weeks. Call with any concerns. Labs drawn today.

## 2018-12-15 NOTE — Assessment & Plan Note (Signed)
Under good control on current regimen. Continue current regimen. Continue to monitor. Call with any concerns. Refills given. Labs drawn today.   

## 2018-12-15 NOTE — Assessment & Plan Note (Signed)
Rechecking labs today. Await results. Call with any concerns.  

## 2018-12-15 NOTE — Assessment & Plan Note (Signed)
Improved with A1c of 7.8 down from 9.2 Continue current regimen. Continue to monitor. Call with any concerns. Refills given.

## 2018-12-15 NOTE — Assessment & Plan Note (Signed)
Worsening. Offered referral to neurology- they would like to hold on this for a bit. Daughter states that he did better on ER metoprolol. Will will change back to that and recheck 2 weeks. Call with any concerns.

## 2018-12-17 DIAGNOSIS — G250 Essential tremor: Secondary | ICD-10-CM | POA: Diagnosis not present

## 2018-12-17 DIAGNOSIS — I69328 Other speech and language deficits following cerebral infarction: Secondary | ICD-10-CM | POA: Diagnosis not present

## 2018-12-17 DIAGNOSIS — I6350 Cerebral infarction due to unspecified occlusion or stenosis of unspecified cerebral artery: Secondary | ICD-10-CM | POA: Diagnosis not present

## 2018-12-17 DIAGNOSIS — F0150 Vascular dementia without behavioral disturbance: Secondary | ICD-10-CM | POA: Diagnosis not present

## 2018-12-17 DIAGNOSIS — I69359 Hemiplegia and hemiparesis following cerebral infarction affecting unspecified side: Secondary | ICD-10-CM | POA: Diagnosis not present

## 2018-12-17 DIAGNOSIS — G479 Sleep disorder, unspecified: Secondary | ICD-10-CM | POA: Diagnosis not present

## 2018-12-17 DIAGNOSIS — M62542 Muscle wasting and atrophy, not elsewhere classified, left hand: Secondary | ICD-10-CM | POA: Diagnosis not present

## 2018-12-17 NOTE — Telephone Encounter (Signed)
Spoke with patients daughter per release form. She states that he is currently taking metoprolol succinate 100 mg once daily and I inquired about neurology. She states that he has seen one in the past and recommended that they may want to consider checking back in with them for the tremors that he is experiencing. She was agreeable and felt that would be a good idea. She states that he is still on medication and no other complaints or concerns at this time. She verbalized understanding of our conversation, agreement with plan, and had no further questions at this time.

## 2018-12-18 ENCOUNTER — Ambulatory Visit: Payer: Self-pay | Admitting: Family Medicine

## 2018-12-18 DIAGNOSIS — E1140 Type 2 diabetes mellitus with diabetic neuropathy, unspecified: Secondary | ICD-10-CM | POA: Diagnosis not present

## 2018-12-18 DIAGNOSIS — B351 Tinea unguium: Secondary | ICD-10-CM | POA: Diagnosis not present

## 2018-12-19 ENCOUNTER — Telehealth: Payer: Self-pay

## 2018-12-19 DIAGNOSIS — I5042 Chronic combined systolic (congestive) and diastolic (congestive) heart failure: Secondary | ICD-10-CM | POA: Diagnosis not present

## 2018-12-19 DIAGNOSIS — I110 Hypertensive heart disease with heart failure: Secondary | ICD-10-CM | POA: Diagnosis not present

## 2018-12-19 DIAGNOSIS — Z8546 Personal history of malignant neoplasm of prostate: Secondary | ICD-10-CM | POA: Diagnosis not present

## 2018-12-19 DIAGNOSIS — M1990 Unspecified osteoarthritis, unspecified site: Secondary | ICD-10-CM | POA: Diagnosis not present

## 2018-12-19 DIAGNOSIS — F419 Anxiety disorder, unspecified: Secondary | ICD-10-CM | POA: Diagnosis not present

## 2018-12-19 DIAGNOSIS — I4891 Unspecified atrial fibrillation: Secondary | ICD-10-CM | POA: Diagnosis not present

## 2018-12-19 DIAGNOSIS — Z8673 Personal history of transient ischemic attack (TIA), and cerebral infarction without residual deficits: Secondary | ICD-10-CM | POA: Diagnosis not present

## 2018-12-19 DIAGNOSIS — I4892 Unspecified atrial flutter: Secondary | ICD-10-CM | POA: Diagnosis not present

## 2018-12-19 DIAGNOSIS — M545 Low back pain: Secondary | ICD-10-CM | POA: Diagnosis not present

## 2018-12-19 DIAGNOSIS — E785 Hyperlipidemia, unspecified: Secondary | ICD-10-CM | POA: Diagnosis not present

## 2018-12-19 DIAGNOSIS — E119 Type 2 diabetes mellitus without complications: Secondary | ICD-10-CM | POA: Diagnosis not present

## 2018-12-19 DIAGNOSIS — Z7984 Long term (current) use of oral hypoglycemic drugs: Secondary | ICD-10-CM | POA: Diagnosis not present

## 2018-12-19 DIAGNOSIS — F329 Major depressive disorder, single episode, unspecified: Secondary | ICD-10-CM | POA: Diagnosis not present

## 2018-12-19 DIAGNOSIS — K219 Gastro-esophageal reflux disease without esophagitis: Secondary | ICD-10-CM | POA: Diagnosis not present

## 2018-12-19 DIAGNOSIS — I2510 Atherosclerotic heart disease of native coronary artery without angina pectoris: Secondary | ICD-10-CM | POA: Diagnosis not present

## 2018-12-19 DIAGNOSIS — Z9981 Dependence on supplemental oxygen: Secondary | ICD-10-CM | POA: Diagnosis not present

## 2018-12-23 ENCOUNTER — Ambulatory Visit: Payer: Self-pay | Admitting: Family Medicine

## 2018-12-27 ENCOUNTER — Telehealth: Payer: Self-pay | Admitting: Cardiovascular Disease

## 2018-12-27 ENCOUNTER — Ambulatory Visit: Payer: PPO | Admitting: Family Medicine

## 2018-12-27 NOTE — Telephone Encounter (Signed)

## 2018-12-29 NOTE — Progress Notes (Signed)
Cardiology Office Note  Date:  12/30/2018   ID:  Todd [name], DOB 02-03-1931, MRN [id number]  PCP:  Guadalupe Maple, MD   Chief Complaint  Patient presents with  . Other    Patient c/o SOB. Meds reviewed verbally with patient.     HPI:  Todd [name] is a 85 year old gentleman with past medical history of Coronary artery disease by catheterization 8-10 years ago Diabetes Hypertension Tremor GERD Hospitalization October 2018 for chest pain Hospitalization December 2018 for TIA Who presents for follow-up of his chest pain, atrial fib with RVR,  TIA symptoms  He is relatively nonverbal, most of the history provided by family Presents in a wheelchair  Fall on hand, left hand bruise Family who presents with him today feels this happened last week Still with significant tremor predominantly left hand left forearm Followed by neurology Family continues to want to change his beta-blocker in the hope this will help his tremor Previously changed from metoprolol succinate to metoprolol tartrate then back to metoprolol succinate They are now requesting change back to propranolol  They also report having spit up, nausea vomiting in the mornings before breakfast when he first gets up Typically takes propranolol in the morning with breakfast  Denies any significant shortness of breath chest pain lower extremity edema On last clinic visit discussed whether to attempt cardioversion At that time they were inclined not to pursue any procedures  Recent hospital records discussed In the hosp 11/2018 Afib/atypical atrial flutter with variable AV block with RVR: Difficult to control ventricular rates on high-dose metoprolol, low-dose digoxin added  Still waited rate, low-dose amiodarone added 200 twice daily with improved rate  -Given his age and comorbid conditions, he is not a good candidate for TEE-guided DCCV -Continue Eliquis 5 twice daily -Plan to pursue DCCV in 3-4 time We will add  Lasix 20 mg daily given pulmonary hypertension on echo secondary to arrhythmia  2D Echo 11/12/2018: 1. The left ventricle has mild-moderately reduced systolic function, with an ejection fraction of 40-45%.  mild dilatation of the aortic root measuring 38 mm.  EKG personally reviewed by myself on todays visit Shows atrial fibrillation /flutter with ventricular rate 65 bpm poor R wave progression to the anterior precordial leads, left axis deviation  Other past medical history reviewed TIA 05/2017 Hospital records reviewed with the patient in detail Speech, difficulty getting his words out sometimes Echo and carotids showed nothing acute LDL 67,  on lipitor Seen by neurology  on ASA, Plavix and statin Skilled nursing facility, then had home PT and speech  Lab work reviewed in detail, testosterone of 9 PSA stable  Carotid <39% b/l  Echo 05/2017 Left ventricle: The cavity size was normal. There was moderate   concentric hypertrophy. Systolic function was normal. The   estimated ejection fraction was in the range of 55% to 60%. Wall   motion was normal; there were no regional wall motion   abnormalities. Doppler parameters are consistent with abnormal   left ventricular relaxation (grade 1 diastolic dysfunction). - Aortic valve: There was trivial regurgitation. - Mitral valve: Calcified annulus. - Left atrium: The atrium was mildly dilated.  Pharmacologic Myoview March 17, 2017 Results reviewed with him in detail today Blood pressure demonstrated a normal response to exercise.  There was no ST segment deviation noted during stress.  The left ventricular ejection fraction is mildly decreased (45-54%).  The study is normal.  This is a low risk study.     PMH:  has a past medical history of Anxiety, Aortic regurgitation, Arthritis, CAD (coronary artery disease), Depression, Diabetes mellitus without complication ([place]), Diastolic dysfunction, ED (erectile dysfunction), Elevated  troponin, GERD (gastroesophageal reflux disease), Glaucoma, Hyperlipidemia, Hypertension, Iron deficiency anemia due to chronic blood loss, Left pontine stroke w/ cerebrovascular disease(HCC), Lumbago, and Prostate cancer ([place]).  PSH:    Past Surgical History:  Procedure Laterality Date  . CARPAL TUNNEL RELEASE    . REPLACEMENT TOTAL KNEE BILATERAL      Current Outpatient Medications  Medication Sig Dispense Refill  . acetaminophen (TYLENOL 8 HOUR ARTHRITIS PAIN) 650 MG CR tablet Take 650 mg by mouth every 8 (eight) hours as needed for pain.    [name] Kitchen apixaban (ELIQUIS) 5 MG TABS tablet Take 1 tablet (5 mg total) by mouth 2 (two) times daily. 180 tablet 1  . atorvastatin (LIPITOR) 20 MG tablet Take 20 mg by mouth daily.    . blood glucose meter kit and supplies KIT Dispense based on patient and insurance preference. Use up to four times daily as directed. (FOR ICD-9 250.00, 250.01). 1 each 0  . Blood Glucose Monitoring Suppl (ACCU-CHEK AVIVA PLUS) w/Device KIT Use to check sugar levels x 2 daily 1 kit 0  . digoxin (LANOXIN) 0.125 MG tablet Take 1 tablet (0.125 mg total) by mouth daily. 90 tablet 1  . DULoxetine (CYMBALTA) 20 MG capsule Take 20 mg by mouth daily.    [name] Kitchen gabapentin (NEURONTIN) 100 MG capsule Take 1 capsule (100 mg total) by mouth 2 (two) times daily. Once a day at night 180 capsule 1  . Lancets (ACCU-CHEK SOFT TOUCH) lancets Use as instructed 100 each 12  . latanoprost (XALATAN) 0.005 % ophthalmic solution Place 1 drop into both eyes at bedtime.     . magnesium oxide (MAG-OX) 400 MG tablet Take 1 tablet (400 mg total) by mouth 2 (two) times daily. 60 tablet 6  . metFORMIN (GLUCOPHAGE) 500 MG tablet Take 1 tablet (500 mg total) by mouth daily with breakfast. 180 tablet 1  . metoprolol succinate (TOPROL-XL) 100 MG 24 hr tablet Take 1 tablet (100 mg total) by mouth daily. Take with or immediately following a meal. 90 tablet 1  . naproxen sodium (ALEVE) 220 MG tablet Take 220 mg by mouth  2 (two) times daily as needed.     [name] ULTRA test strip USE TO CHECK SUGAR LEVELS TWICE DAILY. 100 each 0  . pantoprazole (PROTONIX) 40 MG tablet Take 1 tablet (40 mg total) by mouth daily. 90 tablet 1   No current facility-administered medications for this visit.      Allergies:   Amlodipine, Fluoxetine, and Meloxicam   Social History:  The patient  reports that he quit smoking about 24 years ago. He has never used smokeless tobacco. He reports current alcohol use of about 1.0 standard drinks of alcohol per week. He reports that he does not use drugs.   Family History:   family history includes Hypertension in his father and mother; Stroke in his mother and sister.    Review of Systems: Review of Systems  Constitutional: Negative.   Respiratory: Positive for shortness of breath.   Cardiovascular: Negative.   Gastrointestinal: Negative.   Musculoskeletal: Negative.        Gait instability  Neurological: Positive for tremors.  Psychiatric/Behavioral: Negative.   All other systems reviewed and are negative.   PHYSICAL EXAM: VS:  BP (!) 168/70 (BP Location: Left Arm, Patient Position: Sitting, Cuff Size: Normal)  Pulse (!) 53   Ht 5' 8"  (1.727 m)   Wt 190 lb (86.2 kg)   BMI 28.89 kg/m  , BMI Body mass index is 28.89 kg/m.  Tremor noted GEN: Well nourished, well developed, in no acute distress , presenting in a wheelchair, left hand and forearm tremor HEENT: normal  Neck: no JVD, carotid bruits, or masses Cardiac: Irregular no murmurs, rubs, or gallops,no edema  Respiratory:  clear to auscultation bilaterally, normal work of breathing GI: soft, nontender, nondistended, + BS MS: no deformity or atrophy  Skin: warm and dry, no rash Neuro:  Strength and sensation are intact Psych: euthymic mood, full affect  Recent Labs: 05/09/2018: Magnesium 1.3 11/11/2018: B Natriuretic Peptide 472.0 12/12/2018: ALT 12; BUN 13; Creatinine, Ser 1.23; Hemoglobin 10.0; Platelets 317;  Potassium 4.0; Sodium 145; TSH 1.400    Lipid Panel Lab Results  Component Value Date   CHOL 109 12/12/2018   HDL 39 (L) 12/12/2018   LDLCALC 48 12/12/2018   TRIG 110 12/12/2018      Wt Readings from Last 3 Encounters:  12/30/18 190 lb (86.2 kg)  12/02/18 196 lb 4 oz (89 kg)  11/16/18 189 lb 6.4 oz (85.9 kg)       ASSESSMENT AND PLAN:  Atrial fibrillation/flutter rate well controlled on today's visit Family is requesting change metoprolol succinate back to extended release propranolol 120 mg daily in effort this will help his tremor -Declining cardioversion at this time May be best to continue rate control with Eliquis  Essential hypertension At family's request we will change metoprolol succinate 100 daily over 2 propranolol extended release 120 mg daily  Diabetes mellitus without complication ([place]) Managed by primary care  Imbalance Recent fall, trauma to left hand Discussed if he has additional falls we may need to temporarily hold anticoagulation for day or 2 Presenting in a wheelchair today, chronic leg weakness  Anxiety major issue in the past  on BuSpar  Nausea/vomiting Likely GI related Recommend they move the PPI to the evening Also consider taking Pepcid with Tums for breakthrough, possibly in the morning Sounds like he is spitting up acid/bile  Long discussion concerning his medication options, also anticoagulation options, discussion concerning atrial fibrillation/flutter whether to proceed with cardioversion or rhythm/rate control, risk of falls, bleeding, GERD symptoms  Total encounter time more than 45 minutes  Greater than 50% was spent in counseling and coordination of care with the patient   No orders of the defined types were placed in this encounter.    Signed, Esmond Plants, M.D., Ph.D. 12/30/2018  [place], [place]

## 2018-12-30 ENCOUNTER — Other Ambulatory Visit: Payer: Self-pay

## 2018-12-30 ENCOUNTER — Ambulatory Visit (INDEPENDENT_AMBULATORY_CARE_PROVIDER_SITE_OTHER): Payer: PPO | Admitting: Cardiovascular Disease

## 2018-12-30 ENCOUNTER — Ambulatory Visit: Payer: PPO | Admitting: Family Medicine

## 2018-12-30 VITALS — BP 168/70 | HR 53 | Ht 68.0 in | Wt 190.0 lb

## 2018-12-30 DIAGNOSIS — I5042 Chronic combined systolic (congestive) and diastolic (congestive) heart failure: Secondary | ICD-10-CM | POA: Diagnosis not present

## 2018-12-30 DIAGNOSIS — E119 Type 2 diabetes mellitus without complications: Secondary | ICD-10-CM | POA: Diagnosis not present

## 2018-12-30 DIAGNOSIS — D649 Anemia, unspecified: Secondary | ICD-10-CM | POA: Diagnosis not present

## 2018-12-30 DIAGNOSIS — I4891 Unspecified atrial fibrillation: Secondary | ICD-10-CM

## 2018-12-30 DIAGNOSIS — I2510 Atherosclerotic heart disease of native coronary artery without angina pectoris: Secondary | ICD-10-CM

## 2018-12-30 DIAGNOSIS — E782 Mixed hyperlipidemia: Secondary | ICD-10-CM | POA: Diagnosis not present

## 2018-12-30 DIAGNOSIS — I10 Essential (primary) hypertension: Secondary | ICD-10-CM | POA: Diagnosis not present

## 2018-12-30 MED ORDER — PROPRANOLOL HCL ER 120 MG PO CP24: 120.0000 mg | ORAL_CAPSULE | Freq: Every day | ORAL | 6 refills | Status: DC

## 2018-12-30 NOTE — Patient Instructions (Signed)
Ask [name] about flomax for frequent trips to bathroom  Move the pantoprazole to the evening  If still with nausea in the Am You could add pepcid/tums   Medication Instructions:  Hold the metoprolol Restart propranolol ER 120 mg daily in the Am  If you need a refill on your cardiac medications before your next appointment, please call your pharmacy.    Lab work: No new labs needed   If you have labs (blood work) drawn today and your tests are completely normal, you will receive your results only by: [name] Kitchen MyChart Message (if you have MyChart) OR . A paper copy in the mail If you have any lab test that is abnormal or we need to change your treatment, we will call you to review the results.   Testing/Procedures: No new testing needed   Follow-Up: At [facility], you and your health needs are our priority.  As part of our continuing mission to provide you with exceptional heart care, we have created designated Provider Care Teams.  These Care Teams include your primary Cardiologist (physician) and Advanced Practice Providers (APPs -  Physician Assistants and Nurse Practitioners) who all work together to provide you with the care you need, when you need it.  . You will need a follow up appointment in 12 months .   Please call our office 2 months in advance to schedule this appointment.    . Providers on your designated Care Team:   . Murray Hodgkins, NP . Christell Faith, PA-C . Marrianne Mood, PA-C  Any Other Special Instructions Will Be Listed Below (If Applicable).  For educational health videos Log in to : [email or web address] Or : [email or web address], password : [email or web address]

## 2019-01-02 ENCOUNTER — Other Ambulatory Visit: Payer: Self-pay

## 2019-01-02 ENCOUNTER — Encounter: Payer: Self-pay | Admitting: Family Medicine

## 2019-01-02 ENCOUNTER — Ambulatory Visit (INDEPENDENT_AMBULATORY_CARE_PROVIDER_SITE_OTHER): Payer: PPO | Admitting: Family Medicine

## 2019-01-02 VITALS — BP 164/62 | HR 63 | Temp 94.7°F

## 2019-01-02 DIAGNOSIS — E538 Deficiency of other specified B group vitamins: Secondary | ICD-10-CM

## 2019-01-02 DIAGNOSIS — G250 Essential tremor: Secondary | ICD-10-CM | POA: Diagnosis not present

## 2019-01-02 DIAGNOSIS — R350 Frequency of micturition: Secondary | ICD-10-CM

## 2019-01-02 DIAGNOSIS — N401 Enlarged prostate with lower urinary tract symptoms: Secondary | ICD-10-CM

## 2019-01-02 DIAGNOSIS — F322 Major depressive disorder, single episode, severe without psychotic features: Secondary | ICD-10-CM | POA: Diagnosis not present

## 2019-01-02 DIAGNOSIS — I10 Essential (primary) hypertension: Secondary | ICD-10-CM

## 2019-01-02 MED ORDER — DULOXETINE HCL 30 MG PO CPEP: 30.0000 mg | ORAL_CAPSULE | Freq: Every day | ORAL | 3 refills | Status: DC

## 2019-01-02 MED ORDER — TAMSULOSIN HCL 0.4 MG PO CAPS: 0.4000 mg | ORAL_CAPSULE | Freq: Every day | ORAL | 3 refills | Status: DC

## 2019-01-02 MED ORDER — CYANOCOBALAMIN 1000 MCG/ML IJ SOLN
1000.0000 ug | INTRAMUSCULAR | Status: AC
  Administered 2019-01-02 – 2019-12-15 (×7): 1000 ug via INTRAMUSCULAR

## 2019-01-02 NOTE — Progress Notes (Signed)
BP (!) 164/62   Pulse 63   Temp (!) 94.7 F (34.8 C)   SpO2 100%    Subjective:    Patient ID: Todd [name], male    DOB: 15-Nov-1930, 85 y.o.   MRN: [id number]  HPI: Todd [name] is a 85 y.o. male  Chief Complaint  Patient presents with  . Hypertension  . Tremors   Daughter got his cardiologist to change his metoprolol back to propranolol 3 days ago  Saw neurology 2 weeks ago and increased his gabapentin to see if it would help with his tremor. Dr. Manuella Ghazi recommended changing his metoprolol back to propranolol to see if it helped better with his tremor. They are considering adding carbidopa-levodopa if not getting any better. He is concerned about parkinson's. Daughters note that the shaking is maybe a little bit better.   HYPERTENSION Hypertension status: uncontrolled  Satisfied with current treatment? no Duration of hypertension: chronic BP monitoring frequency:  not checking BP medication side effects:  no Medication compliance: good compliance Previous BP meds: digoxin, propranolol Aspirin: no Recurrent headaches: no Visual changes: no Palpitations: no Dyspnea: no Chest pain: no Lower extremity edema: no Dizzy/lightheaded: no  Relevant past medical, surgical, family and social history reviewed and updated as indicated. Interim medical history since our last visit reviewed. Allergies and medications reviewed and updated.  Review of Systems  Constitutional: Negative.   Respiratory: Negative.   Cardiovascular: Negative.   Musculoskeletal: Negative.   Neurological: Positive for tremors. Negative for dizziness, seizures, syncope, facial asymmetry, speech difficulty, weakness, light-headedness, numbness and headaches.  Psychiatric/Behavioral: Positive for dysphoric mood. Negative for agitation, behavioral problems, confusion, decreased concentration, hallucinations, self-injury, sleep disturbance and suicidal ideas. The patient is not nervous/anxious and is not  hyperactive.     Per HPI unless specifically indicated above     Objective:    BP (!) 164/62   Pulse 63   Temp (!) 94.7 F (34.8 C)   SpO2 100%   Wt Readings from Last 3 Encounters:  12/30/18 190 lb (86.2 kg)  12/02/18 196 lb 4 oz (89 kg)  11/16/18 189 lb 6.4 oz (85.9 kg)    Physical Exam Vitals signs and nursing note reviewed.  Constitutional:      General: He is not in acute distress.    Appearance: Normal appearance. He is not ill-appearing, toxic-appearing or diaphoretic.  HENT:     Head: Normocephalic and atraumatic.     Right Ear: External ear normal.     Left Ear: External ear normal.     Nose: Nose normal.     Mouth/Throat:     Mouth: Mucous membranes are moist.     Pharynx: Oropharynx is clear.  Eyes:     General: No scleral icterus.       Right eye: No discharge.        Left eye: No discharge.     Extraocular Movements: Extraocular movements intact.     Conjunctiva/sclera: Conjunctivae normal.     Pupils: Pupils are equal, round, and reactive to light.  Neck:     Musculoskeletal: Normal range of motion and neck supple.  Cardiovascular:     Rate and Rhythm: Normal rate and regular rhythm.     Pulses: Normal pulses.     Heart sounds: Normal heart sounds. No murmur. No friction rub. No gallop.   Pulmonary:     Effort: Pulmonary effort is normal. No respiratory distress.     Breath sounds: Normal breath sounds. No stridor. No  wheezing, rhonchi or rales.  Chest:     Chest wall: No tenderness.  Musculoskeletal: Normal range of motion.  Skin:    General: Skin is warm and dry.     Capillary Refill: Capillary refill takes less than 2 seconds.     Coloration: Skin is not jaundiced or pale.     Findings: No bruising, erythema, lesion or rash.  Neurological:     General: No focal deficit present.     Mental Status: He is alert and oriented to person, place, and time. Mental status is at baseline.     Comments: Mild tremor  Psychiatric:        Mood and Affect:  Mood normal.        Behavior: Behavior normal.        Thought Content: Thought content normal.        Judgment: Judgment normal.     Results for orders placed or performed in visit on 12/12/18  Microscopic Examination   URINE  Result Value Ref Range   WBC, UA None seen 0 - 5 /hpf   RBC None seen 0 - 2 /hpf   Epithelial Cells (non renal) 0-10 0 - 10 /hpf   Casts Present None seen /lpf   Cast Type Hyaline casts N/A   Bacteria, UA None seen None seen/Few  Bayer DCA Hb A1c Waived  Result Value Ref Range   HB A1C (BAYER DCA - WAIVED) 7.3 (H) <7.0 %  CBC with Differential/Platelet  Result Value Ref Range   WBC 7.1 3.4 - 10.8 x10E3/uL   RBC 3.31 (L) 4.14 - 5.80 x10E6/uL   Hemoglobin 10.0 (L) 13.0 - 17.7 g/dL   Hematocrit 30.8 (L) 37.5 - 51.0 %   MCV 93 79 - 97 fL   MCH 30.2 26.6 - 33.0 pg   MCHC 32.5 31.5 - 35.7 g/dL   RDW 12.5 11.6 - 15.4 %   Platelets 317 150 - 450 x10E3/uL   Neutrophils 69 Not Estab. %   Lymphs 15 Not Estab. %   Monocytes 11 Not Estab. %   Eos 2 Not Estab. %   Basos 1 Not Estab. %   Neutrophils Absolute 4.9 1.4 - 7.0 x10E3/uL   Lymphocytes Absolute 1.0 0.7 - 3.1 x10E3/uL   Monocytes Absolute 0.8 0.1 - 0.9 x10E3/uL   EOS (ABSOLUTE) 0.2 0.0 - 0.4 x10E3/uL   Basophils Absolute 0.1 0.0 - 0.2 x10E3/uL   Immature Granulocytes 2 Not Estab. %   Immature Grans (Abs) 0.2 (H) 0.0 - 0.1 x10E3/uL  Comprehensive metabolic panel  Result Value Ref Range   Glucose 215 (H) 65 - 99 mg/dL   BUN 13 8 - 27 mg/dL   Creatinine, Ser 1.23 0.76 - 1.27 mg/dL   GFR calc non Af Amer 53 (L) >59 mL/min/1.73   GFR calc Af Amer 61 >59 mL/min/1.73   BUN/Creatinine Ratio 11 10 - 24   Sodium 145 (H) 134 - 144 mmol/L   Potassium 4.0 3.5 - 5.2 mmol/L   Chloride 101 96 - 106 mmol/L   CO2 29 20 - 29 mmol/L   Calcium 9.5 8.6 - 10.2 mg/dL   Total Protein 6.8 6.0 - 8.5 g/dL   Albumin 4.1 3.6 - 4.6 g/dL   Globulin, Total 2.7 1.5 - 4.5 g/dL   Albumin/Globulin Ratio 1.5 1.2 - 2.2    Bilirubin Total 0.7 0.0 - 1.2 mg/dL   Alkaline Phosphatase 74 39 - 117 IU/L   AST 27 0 - 40 IU/L  ALT 12 0 - 44 IU/L  Lipid Panel w/o Chol/HDL Ratio  Result Value Ref Range   Cholesterol, Total 109 100 - 199 mg/dL   Triglycerides 110 0 - 149 mg/dL   HDL 39 (L) >39 mg/dL   VLDL Cholesterol Cal 22 5 - 40 mg/dL   LDL Calculated 48 0 - 99 mg/dL  TSH  Result Value Ref Range   TSH 1.400 0.450 - 4.500 uIU/mL  B12 and Folate Panel  Result Value Ref Range   Vitamin B-12 1,390 (H) 232 - 1,245 pg/mL   Folate >20.0 >3.0 ng/mL  VITAMIN D 25 Hydroxy (Vit-D Deficiency, Fractures)  Result Value Ref Range   Vit D, 25-Hydroxy 42.7 30.0 - 100.0 ng/mL  Microalbumin, Urine Waived  Result Value Ref Range   Microalb, Ur Waived 80 (H) 0 - 19 mg/L   Creatinine, Urine Waived 100 10 - 300 mg/dL   Microalb/Creat Ratio 30-300 (H) <30 mg/g  UA/M w/rflx Culture, Routine   Specimen: Urine   URINE  Result Value Ref Range   Specific Gravity, UA 1.015 1.005 - 1.030   pH, UA 7.0 5.0 - 7.5   Color, UA Yellow Yellow   Appearance Ur Clear Clear   Leukocytes,UA Negative Negative   Protein,UA Trace (A) Negative/Trace   Glucose, UA Trace (A) Negative   Ketones, UA Negative Negative   RBC, UA Negative Negative   Bilirubin, UA Negative Negative   Urobilinogen, Ur 4.0 (H) 0.2 - 1.0 mg/dL   Nitrite, UA Negative Negative   Microscopic Examination See below:       Assessment & Plan:   Problem List Items Addressed This Visit      Cardiovascular and Mediastinum   Hypertension    Not under good control at this time. Just had propranolol restarted 3 days ago. Will allow a little bit more time in. Continue to monitor. Call with any concerns. Continue to follow with cardiology.        Nervous and Auditory   Benign essential tremor - Primary    Working with neurology. Just had his metoprolol to propranolol 3 days ago. Tremor seems better. Will continue to monitor. Call with any concerns.         Other   B12  deficiency    B12 shot given today. Call with any concerns.       Relevant Medications   cyanocobalamin ((VITAMIN B-12)) injection 1,000 mcg   Depression, major, single episode, severe ([place])    Had been on 60mg  of cymbalta. This was decreased to 20mg  in the hospital- unclear as to why this was dropped. Has been more weepy recently. Will increase back to 30mg  and recheck 1 month. Treat as needed. Call with any concerns.       Relevant Medications   DULoxetine (CYMBALTA) 30 MG capsule    Other Visit Diagnoses    Benign prostatic hyperplasia with urinary frequency       Will start flomax. Call with any concerns. Continue to monitor.        Follow up plan: Return in about 4 weeks (around 01/30/2019).

## 2019-01-04 ENCOUNTER — Encounter: Payer: Self-pay | Admitting: Family Medicine

## 2019-01-04 NOTE — Assessment & Plan Note (Signed)
B12 shot given today. Call with any concerns.  

## 2019-01-04 NOTE — Assessment & Plan Note (Signed)
Not under good control at this time. Just had propranolol restarted 3 days ago. Will allow a little bit more time in. Continue to monitor. Call with any concerns. Continue to follow with cardiology.

## 2019-01-04 NOTE — Assessment & Plan Note (Signed)
Had been on 60mg  of cymbalta. This was decreased to 20mg  in the hospital- unclear as to why this was dropped. Has been more weepy recently. Will increase back to 30mg  and recheck 1 month. Treat as needed. Call with any concerns.

## 2019-01-04 NOTE — Assessment & Plan Note (Signed)
Working with neurology. Just had his metoprolol to propranolol 3 days ago. Tremor seems better. Will continue to monitor. Call with any concerns.

## 2019-01-10 DIAGNOSIS — I501 Left ventricular failure: Secondary | ICD-10-CM | POA: Diagnosis not present

## 2019-01-10 DIAGNOSIS — I2510 Atherosclerotic heart disease of native coronary artery without angina pectoris: Secondary | ICD-10-CM | POA: Diagnosis not present

## 2019-01-10 DIAGNOSIS — R0902 Hypoxemia: Secondary | ICD-10-CM | POA: Diagnosis not present

## 2019-01-15 ENCOUNTER — Telehealth: Payer: Self-pay | Admitting: Family Medicine

## 2019-01-15 NOTE — Telephone Encounter (Signed)
Tiffany nurse w/ [facility] states the pt's daughter requesting hospital bed, and she agrees. For pressure ulcer preventive. Can you fax order to [facility] Fax:  [phone]   Tiffany cb [phone]

## 2019-01-17 NOTE — Telephone Encounter (Signed)
OK to write up on Rx pad, someone else can sign or I'll sign on Monday when I return.

## 2019-01-17 NOTE — Telephone Encounter (Signed)
RX written up and placed in Dr. Durenda Age folder for her to sign on Monday.

## 2019-01-18 DIAGNOSIS — E785 Hyperlipidemia, unspecified: Secondary | ICD-10-CM | POA: Diagnosis not present

## 2019-01-18 DIAGNOSIS — F329 Major depressive disorder, single episode, unspecified: Secondary | ICD-10-CM | POA: Diagnosis not present

## 2019-01-18 DIAGNOSIS — M545 Low back pain: Secondary | ICD-10-CM | POA: Diagnosis not present

## 2019-01-18 DIAGNOSIS — M1990 Unspecified osteoarthritis, unspecified site: Secondary | ICD-10-CM | POA: Diagnosis not present

## 2019-01-18 DIAGNOSIS — Z8673 Personal history of transient ischemic attack (TIA), and cerebral infarction without residual deficits: Secondary | ICD-10-CM | POA: Diagnosis not present

## 2019-01-18 DIAGNOSIS — I110 Hypertensive heart disease with heart failure: Secondary | ICD-10-CM | POA: Diagnosis not present

## 2019-01-18 DIAGNOSIS — Z7984 Long term (current) use of oral hypoglycemic drugs: Secondary | ICD-10-CM | POA: Diagnosis not present

## 2019-01-18 DIAGNOSIS — I2510 Atherosclerotic heart disease of native coronary artery without angina pectoris: Secondary | ICD-10-CM | POA: Diagnosis not present

## 2019-01-18 DIAGNOSIS — I4892 Unspecified atrial flutter: Secondary | ICD-10-CM | POA: Diagnosis not present

## 2019-01-18 DIAGNOSIS — F419 Anxiety disorder, unspecified: Secondary | ICD-10-CM | POA: Diagnosis not present

## 2019-01-18 DIAGNOSIS — I5042 Chronic combined systolic (congestive) and diastolic (congestive) heart failure: Secondary | ICD-10-CM | POA: Diagnosis not present

## 2019-01-18 DIAGNOSIS — E119 Type 2 diabetes mellitus without complications: Secondary | ICD-10-CM | POA: Diagnosis not present

## 2019-01-18 DIAGNOSIS — Z9981 Dependence on supplemental oxygen: Secondary | ICD-10-CM | POA: Diagnosis not present

## 2019-01-18 DIAGNOSIS — Z8546 Personal history of malignant neoplasm of prostate: Secondary | ICD-10-CM | POA: Diagnosis not present

## 2019-01-18 DIAGNOSIS — I4891 Unspecified atrial fibrillation: Secondary | ICD-10-CM | POA: Diagnosis not present

## 2019-01-18 DIAGNOSIS — K219 Gastro-esophageal reflux disease without esophagitis: Secondary | ICD-10-CM | POA: Diagnosis not present

## 2019-01-20 NOTE — Telephone Encounter (Signed)
Order faxed.

## 2019-01-22 DIAGNOSIS — E119 Type 2 diabetes mellitus without complications: Secondary | ICD-10-CM | POA: Diagnosis not present

## 2019-01-22 LAB — HM DIABETES EYE EXAM

## 2019-01-27 DIAGNOSIS — I6990 Unspecified sequelae of unspecified cerebrovascular disease: Secondary | ICD-10-CM | POA: Diagnosis not present

## 2019-01-27 DIAGNOSIS — E785 Hyperlipidemia, unspecified: Secondary | ICD-10-CM | POA: Diagnosis not present

## 2019-01-27 DIAGNOSIS — I10 Essential (primary) hypertension: Secondary | ICD-10-CM | POA: Diagnosis not present

## 2019-01-27 DIAGNOSIS — Z9181 History of falling: Secondary | ICD-10-CM | POA: Diagnosis not present

## 2019-01-27 DIAGNOSIS — G894 Chronic pain syndrome: Secondary | ICD-10-CM | POA: Diagnosis not present

## 2019-02-10 DIAGNOSIS — M79601 Pain in right arm: Secondary | ICD-10-CM | POA: Diagnosis not present

## 2019-02-10 DIAGNOSIS — R0902 Hypoxemia: Secondary | ICD-10-CM | POA: Diagnosis not present

## 2019-02-10 DIAGNOSIS — I2510 Atherosclerotic heart disease of native coronary artery without angina pectoris: Secondary | ICD-10-CM | POA: Diagnosis not present

## 2019-02-10 DIAGNOSIS — R05 Cough: Secondary | ICD-10-CM | POA: Diagnosis not present

## 2019-02-10 DIAGNOSIS — I501 Left ventricular failure: Secondary | ICD-10-CM | POA: Diagnosis not present

## 2019-02-11 ENCOUNTER — Ambulatory Visit (INDEPENDENT_AMBULATORY_CARE_PROVIDER_SITE_OTHER): Payer: PPO | Admitting: Family Medicine

## 2019-02-11 ENCOUNTER — Encounter: Payer: Self-pay | Admitting: Family Medicine

## 2019-02-11 ENCOUNTER — Other Ambulatory Visit: Payer: Self-pay

## 2019-02-11 VITALS — BP 162/73 | HR 54 | Temp 97.5°F | Ht 68.0 in

## 2019-02-11 DIAGNOSIS — I69359 Hemiplegia and hemiparesis following cerebral infarction affecting unspecified side: Secondary | ICD-10-CM | POA: Diagnosis not present

## 2019-02-11 DIAGNOSIS — I6930 Unspecified sequelae of cerebral infarction: Secondary | ICD-10-CM | POA: Diagnosis not present

## 2019-02-11 DIAGNOSIS — Z23 Encounter for immunization: Secondary | ICD-10-CM | POA: Diagnosis not present

## 2019-02-11 DIAGNOSIS — M5136 Other intervertebral disc degeneration, lumbar region: Secondary | ICD-10-CM

## 2019-02-11 DIAGNOSIS — R2689 Other abnormalities of gait and mobility: Secondary | ICD-10-CM | POA: Diagnosis not present

## 2019-02-11 DIAGNOSIS — I10 Essential (primary) hypertension: Secondary | ICD-10-CM

## 2019-02-11 DIAGNOSIS — E538 Deficiency of other specified B group vitamins: Secondary | ICD-10-CM

## 2019-02-11 MED ORDER — HYDRALAZINE HCL 50 MG PO TABS: 50.0000 mg | ORAL_TABLET | Freq: Three times a day (TID) | ORAL | 3 refills | Status: DC

## 2019-02-11 NOTE — Assessment & Plan Note (Signed)
Weak and difficult to move around. Wheelchair bound. Would advise hospital bed to avoid falls and to help family with care. Order placed today.

## 2019-02-11 NOTE — Progress Notes (Signed)
BP (!) 162/73   Pulse (!) 54   Temp (!) 97.5 F (36.4 C)   Ht 5\' 8"  (1.727 m)   SpO2 99%   BMI 28.89 kg/m    Subjective:    Patient ID: Todd [name], male    DOB: 04-19-1931, 85 y.o.   MRN: [id number]  HPI: Todd [name] is a 85 y.o. male  Chief Complaint  Patient presents with  . Hypertension  . Other    Hospital Bed   HYPERTENSION Hypertension status: uncontrolled  Satisfied with current treatment? no Duration of hypertension: chronic BP monitoring frequency:  not checking BP medication side effects:  no Medication compliance: good compliance Previous BP meds: hydralazine, lasix, propranolol Recurrent headaches: no Visual changes: no Palpitations: no Dyspnea: no Chest pain: no Lower extremity edema: no Dizzy/lightheaded: no   Needs a hospital bed. He is s/p stroke with residual hemiplegia. He is wheelchair bound. He is cared for at home by his daughters, but is unable to lift himself on his own and his family has a lot of trouble with lifting him as well. They are concerned about him falling out of bed and not being able to get up again. He would benefit from a hospital bed for ease of care. He is otherwise doing OK with no other concerns or complaints at this time.   Relevant past medical, surgical, family and social history reviewed and updated as indicated. Interim medical history since our last visit reviewed. Allergies and medications reviewed and updated.  Review of Systems  Constitutional: Negative.   Respiratory: Negative.   Cardiovascular: Negative.   Gastrointestinal: Negative.   Musculoskeletal: Positive for arthralgias, back pain, gait problem and myalgias. Negative for joint swelling, neck pain and neck stiffness.  Skin: Negative.   Neurological: Positive for dizziness, tremors, speech difficulty, weakness and light-headedness. Negative for seizures, syncope, facial asymmetry, numbness and headaches.  Psychiatric/Behavioral: Negative.     Per HPI  unless specifically indicated above     Objective:    BP (!) 162/73   Pulse (!) 54   Temp (!) 97.5 F (36.4 C)   Ht 5\' 8"  (1.727 m)   SpO2 99%   BMI 28.89 kg/m   Wt Readings from Last 3 Encounters:  12/30/18 190 lb (86.2 kg)  12/02/18 196 lb 4 oz (89 kg)  11/16/18 189 lb 6.4 oz (85.9 kg)    Physical Exam Vitals signs and nursing note reviewed.  Constitutional:      General: He is not in acute distress.    Appearance: Normal appearance. He is not ill-appearing, toxic-appearing or diaphoretic.  HENT:     Head: Normocephalic and atraumatic.     Right Ear: External ear normal.     Left Ear: External ear normal.     Nose: Nose normal.     Mouth/Throat:     Mouth: Mucous membranes are moist.     Pharynx: Oropharynx is clear.  Eyes:     General: No scleral icterus.       Right eye: No discharge.        Left eye: No discharge.     Extraocular Movements: Extraocular movements intact.     Conjunctiva/sclera: Conjunctivae normal.     Pupils: Pupils are equal, round, and reactive to light.  Neck:     Musculoskeletal: Normal range of motion and neck supple.  Cardiovascular:     Rate and Rhythm: Normal rate and regular rhythm.     Pulses: Normal pulses.  Heart sounds: Normal heart sounds. No murmur. No friction rub. No gallop.   Pulmonary:     Effort: Pulmonary effort is normal. No respiratory distress.     Breath sounds: Normal breath sounds. No stridor. No wheezing, rhonchi or rales.  Chest:     Chest wall: No tenderness.  Musculoskeletal: Normal range of motion.  Skin:    General: Skin is warm and dry.     Capillary Refill: Capillary refill takes less than 2 seconds.     Coloration: Skin is not jaundiced or pale.     Findings: No bruising, erythema, lesion or rash.  Neurological:     General: No focal deficit present.     Mental Status: He is alert and oriented to person, place, and time. Mental status is at baseline.     Motor: Weakness present.     Gait: Gait  abnormal.     Comments: Hemiplegia, unable to stand or walk, wheelchair bound, + tremor  Psychiatric:        Mood and Affect: Mood normal.        Behavior: Behavior normal.        Thought Content: Thought content normal.        Judgment: Judgment normal.     Results for orders placed or performed in visit on 01/23/19  HM DIABETES EYE EXAM  Result Value Ref Range   HM Diabetic Eye Exam No Retinopathy No Retinopathy      Assessment & Plan:   Problem List Items Addressed This Visit      Cardiovascular and Mediastinum   Hypertension - Primary    Not under good control- will increase hydralazine and recheck 1 month. Call with any concerns.       Relevant Medications   atorvastatin (LIPITOR) 10 MG tablet   furosemide (LASIX) 20 MG tablet   hydrALAZINE (APRESOLINE) 50 MG tablet     Nervous and Auditory   Hemiplegia (HCC)    Weak and difficult to move around. Wheelchair bound. Would advise hospital bed to avoid falls and to help family with care. Order placed today.        Musculoskeletal and Integument   Degeneration of intervertebral disc of lumbar region    Weak and difficult to move around. Wheelchair bound. Would advise hospital bed to avoid falls and to help family with care. Order placed today.        Other   Imbalance    Weak and difficult to move around. Wheelchair bound. Would advise hospital bed to avoid falls and to help family with care. Order placed today.      History of stroke with residual deficit    Weak and difficult to move around. Wheelchair bound. Would advise hospital bed to avoid falls and to help family with care. Order placed today.       Other Visit Diagnoses    Needs flu shot       Flu shot given today.   Relevant Orders   Flu Vaccine QUAD High Dose(Fluad) (Completed)       Follow up plan: Return in about 4 weeks (around 03/11/2019) for follow up BP.

## 2019-02-12 ENCOUNTER — Encounter: Payer: Self-pay | Admitting: Family Medicine

## 2019-02-12 DIAGNOSIS — R05 Cough: Secondary | ICD-10-CM | POA: Diagnosis not present

## 2019-02-12 DIAGNOSIS — M79601 Pain in right arm: Secondary | ICD-10-CM | POA: Diagnosis not present

## 2019-02-12 NOTE — Assessment & Plan Note (Signed)
Not under good control- will increase hydralazine and recheck 1 month. Call with any concerns.

## 2019-02-27 NOTE — Telephone Encounter (Signed)
Can we please find out what they need and I'll be happy to give orders

## 2019-02-27 NOTE — Telephone Encounter (Signed)
Geralyn Corwin at [facility]. Needs more info to be able provide hospital bed for pt.  Please have Dr Wynetta Emery call directly to give verbal orders/instructions:   Ph: [phone] Ref #: [id number]

## 2019-02-27 NOTE — Telephone Encounter (Signed)
Called [facility]. Confirmed order for hospital bed.

## 2019-03-12 DIAGNOSIS — I501 Left ventricular failure: Secondary | ICD-10-CM | POA: Diagnosis not present

## 2019-03-12 DIAGNOSIS — I2510 Atherosclerotic heart disease of native coronary artery without angina pectoris: Secondary | ICD-10-CM | POA: Diagnosis not present

## 2019-03-12 DIAGNOSIS — R0902 Hypoxemia: Secondary | ICD-10-CM | POA: Diagnosis not present

## 2019-03-17 ENCOUNTER — Other Ambulatory Visit: Payer: Self-pay

## 2019-03-17 ENCOUNTER — Ambulatory Visit (INDEPENDENT_AMBULATORY_CARE_PROVIDER_SITE_OTHER): Payer: PPO | Admitting: Family Medicine

## 2019-03-17 ENCOUNTER — Encounter: Payer: Self-pay | Admitting: Family Medicine

## 2019-03-17 VITALS — BP 131/80 | Temp 97.3°F | Ht 68.0 in | Wt 177.0 lb

## 2019-03-17 DIAGNOSIS — E538 Deficiency of other specified B group vitamins: Secondary | ICD-10-CM | POA: Diagnosis not present

## 2019-03-17 DIAGNOSIS — I10 Essential (primary) hypertension: Secondary | ICD-10-CM

## 2019-03-17 MED ORDER — HYDRALAZINE HCL 50 MG PO TABS: 50.0000 mg | ORAL_TABLET | Freq: Three times a day (TID) | ORAL | 1 refills | Status: DC

## 2019-03-17 NOTE — Progress Notes (Signed)
BP 131/80   Temp (!) 97.3 F (36.3 C) (Oral)   Ht 5\' 8"  (1.727 m)   Wt 177 lb (80.3 kg)   BMI 26.91 kg/m    Subjective:    Patient ID: Todd [name], male    DOB: [birth year], 85 y.o.   MRN: [id number]  HPI: Todd [name] is a 85 y.o. male  Chief Complaint  Patient presents with  . Hypertension   HYPERTENSION Hypertension status: controlled  Satisfied with current treatment? yes Duration of hypertension: chronic BP monitoring frequency:  not checking BP medication side effects:  no Medication compliance: excellent compliance Previous BP meds: propranolol, hydralazine, lasix, digoxin Aspirin: no Recurrent headaches: no Visual changes: no Palpitations: no Dyspnea: no Chest pain: no Lower extremity edema: no Dizzy/lightheaded: no  Relevant past medical, surgical, family and social history reviewed and updated as indicated. Interim medical history since our last visit reviewed. Allergies and medications reviewed and updated.  Review of Systems  Constitutional: Negative.   Respiratory: Negative.   Cardiovascular: Negative.   Neurological: Positive for tremors, speech difficulty and weakness. Negative for dizziness, seizures, syncope, facial asymmetry, light-headedness, numbness and headaches.  Psychiatric/Behavioral: Negative.     Per HPI unless specifically indicated above     Objective:    BP 131/80   Temp (!) 97.3 F (36.3 C) (Oral)   Ht 5\' 8"  (1.727 m)   Wt 177 lb (80.3 kg)   BMI 26.91 kg/m   Wt Readings from Last 3 Encounters:  03/17/19 177 lb (80.3 kg)  12/30/18 190 lb (86.2 kg)  12/02/18 196 lb 4 oz (89 kg)    Physical Exam Vitals signs and nursing note reviewed.  Constitutional:      General: He is not in acute distress.    Appearance: Normal appearance. He is not ill-appearing, toxic-appearing or diaphoretic.  HENT:     Head: Normocephalic and atraumatic.     Right Ear: External ear normal.     Left Ear: External ear normal.     Nose: Nose  normal.     Mouth/Throat:     Mouth: Mucous membranes are moist.     Pharynx: Oropharynx is clear.  Eyes:     General: No scleral icterus.       Right eye: No discharge.        Left eye: No discharge.     Extraocular Movements: Extraocular movements intact.     Conjunctiva/sclera: Conjunctivae normal.     Pupils: Pupils are equal, round, and reactive to light.  Neck:     Musculoskeletal: Normal range of motion and neck supple.  Cardiovascular:     Rate and Rhythm: Normal rate and regular rhythm.     Pulses: Normal pulses.     Heart sounds: Normal heart sounds. No murmur. No friction rub. No gallop.   Pulmonary:     Effort: Pulmonary effort is normal. No respiratory distress.     Breath sounds: Normal breath sounds. No stridor. No wheezing, rhonchi or rales.  Chest:     Chest wall: No tenderness.  Musculoskeletal:     Comments: Hemiplegia   Skin:    General: Skin is warm and dry.     Capillary Refill: Capillary refill takes less than 2 seconds.     Coloration: Skin is not jaundiced or pale.     Findings: No bruising, erythema, lesion or rash.  Neurological:     Mental Status: He is alert and oriented to person, place, and time. Mental status is  at baseline.     Comments: Hemiplegia   Psychiatric:        Mood and Affect: Mood is depressed.        Behavior: Behavior normal.        Thought Content: Thought content normal.        Judgment: Judgment normal.     Results for orders placed or performed in visit on 01/23/19  HM DIABETES EYE EXAM  Result Value Ref Range   HM Diabetic Eye Exam No Retinopathy No Retinopathy      Assessment & Plan:   Problem List Items Addressed This Visit      Cardiovascular and Mediastinum   Hypertension - Primary    Under good control on current regimen. Continue current regimen. Continue to monitor. Call with any concerns. Refills given. Labs drawn today.       Relevant Orders   Basic metabolic panel       Follow up plan: Return  in about 4 weeks (around 04/14/2019) for DM follow up.

## 2019-03-17 NOTE — Assessment & Plan Note (Signed)
Under good control on current regimen. Continue current regimen. Continue to monitor. Call with any concerns. Refills given. Labs drawn today.   

## 2019-03-18 ENCOUNTER — Telehealth: Payer: Self-pay | Admitting: Family Medicine

## 2019-03-18 LAB — BASIC METABOLIC PANEL
BUN/Creatinine Ratio: 13 (ref 10–24)
BUN: 15 mg/dL (ref 8–27)
CO2: 27 mmol/L (ref 20–29)
Calcium: 9.4 mg/dL (ref 8.6–10.2)
Chloride: 99 mmol/L (ref 96–106)
Creatinine, Ser: 1.14 mg/dL (ref 0.76–1.27)
GFR calc Af Amer: 67 mL/min/{1.73_m2} (ref >59)
GFR calc non Af Amer: 58 mL/min/{1.73_m2} — ABNORMAL LOW (ref >59)
Glucose: 277 mg/dL — ABNORMAL HIGH (ref 65–99)
Potassium: 4.4 mmol/L (ref 3.5–5.2)
Sodium: 140 mmol/L (ref 134–144)

## 2019-03-18 NOTE — Telephone Encounter (Signed)
Called and LVM letting patient's daughter know about labs. Asked for them to please call back with any questions or concerns.

## 2019-03-18 NOTE — Telephone Encounter (Signed)
Please let his daughter know that his labs look nice and normal. Thanks!

## 2019-03-19 DIAGNOSIS — I6990 Unspecified sequelae of unspecified cerebrovascular disease: Secondary | ICD-10-CM | POA: Diagnosis not present

## 2019-03-19 DIAGNOSIS — E119 Type 2 diabetes mellitus without complications: Secondary | ICD-10-CM | POA: Diagnosis not present

## 2019-03-19 DIAGNOSIS — I10 Essential (primary) hypertension: Secondary | ICD-10-CM | POA: Diagnosis not present

## 2019-03-19 DIAGNOSIS — E1140 Type 2 diabetes mellitus with diabetic neuropathy, unspecified: Secondary | ICD-10-CM | POA: Diagnosis not present

## 2019-03-19 DIAGNOSIS — I2510 Atherosclerotic heart disease of native coronary artery without angina pectoris: Secondary | ICD-10-CM | POA: Diagnosis not present

## 2019-03-19 DIAGNOSIS — F17200 Nicotine dependence, unspecified, uncomplicated: Secondary | ICD-10-CM | POA: Diagnosis not present

## 2019-03-19 DIAGNOSIS — B351 Tinea unguium: Secondary | ICD-10-CM | POA: Diagnosis not present

## 2019-03-19 DIAGNOSIS — E559 Vitamin D deficiency, unspecified: Secondary | ICD-10-CM | POA: Diagnosis not present

## 2019-03-26 DIAGNOSIS — F17200 Nicotine dependence, unspecified, uncomplicated: Secondary | ICD-10-CM | POA: Diagnosis not present

## 2019-03-26 DIAGNOSIS — I10 Essential (primary) hypertension: Secondary | ICD-10-CM | POA: Diagnosis not present

## 2019-03-26 DIAGNOSIS — E119 Type 2 diabetes mellitus without complications: Secondary | ICD-10-CM | POA: Diagnosis not present

## 2019-03-26 DIAGNOSIS — I2510 Atherosclerotic heart disease of native coronary artery without angina pectoris: Secondary | ICD-10-CM | POA: Diagnosis not present

## 2019-03-26 DIAGNOSIS — E559 Vitamin D deficiency, unspecified: Secondary | ICD-10-CM | POA: Diagnosis not present

## 2019-03-26 DIAGNOSIS — I6990 Unspecified sequelae of unspecified cerebrovascular disease: Secondary | ICD-10-CM | POA: Diagnosis not present

## 2019-04-01 ENCOUNTER — Other Ambulatory Visit: Payer: Self-pay | Admitting: Family Medicine

## 2019-04-01 MED ORDER — ATORVASTATIN CALCIUM 20 MG PO TABS: 20.0000 mg | ORAL_TABLET | Freq: Every day | ORAL | 1 refills | Status: DC

## 2019-04-01 NOTE — Telephone Encounter (Signed)
Medication Refill - Medication: atorvastatin (LIPITOR) 20 MG tablet  Has the patient contacted their pharmacy? Yes - states that he was on 10mg  and Dr. Wynetta Emery increased to 20mg .  Needs new script sent in (Agent: If no, request that the patient contact the pharmacy for the refill.) (Agent: If yes, when and what did the pharmacy advise?)  Preferred Pharmacy (with phone number or street name):  [facility], [place] [phone] (Phone) [phone] (Fax)   Agent: Please be advised that RX refills may take up to 3 business days. We ask that you follow-up with your pharmacy.

## 2019-04-01 NOTE — Telephone Encounter (Signed)
Requested medication (s) are due for refill today: yes  Requested medication (s) are on the active medication list: yes  Last refill:  12/30/2018  Future visit scheduled: yes  Notes to clinic:  Review for refill Last filled by historical provider Patient states that his dose was increased from 10 to 20mg    Requested Prescriptions  Pending Prescriptions Disp Refills   atorvastatin (LIPITOR) 20 MG tablet       Sig: Take 1 tablet (20 mg total) by mouth daily.     Cardiovascular:  Antilipid - Statins Failed - 04/01/2019 10:46 AM      Failed - HDL in normal range and within 360 days    HDL  Date Value Ref Range Status  12/12/2018 39 (L) >39 mg/dL Final         Passed - Total Cholesterol in normal range and within 360 days    Cholesterol, Total  Date Value Ref Range Status  12/12/2018 109 100 - 199 mg/dL Final   Cholesterol Piccolo, Waived  Date Value Ref Range Status  09/26/2017 156 <200 mg/dL Final    Comment:                            Desirable                <200                         Borderline High      200- 239                         High                     >239          Passed - LDL in normal range and within 360 days    LDL Calculated  Date Value Ref Range Status  12/12/2018 48 0 - 99 mg/dL Final         Passed - Triglycerides in normal range and within 360 days    Triglycerides  Date Value Ref Range Status  12/12/2018 110 0 - 149 mg/dL Final   Triglycerides Piccolo,Waived  Date Value Ref Range Status  09/26/2017 156 (H) <150 mg/dL Final    Comment:                            Normal                   <150                         Borderline High     150 - 199                         High                200 - 499                         Very High                >499          Passed - Patient is not pregnant      Passed - Valid encounter within last  12 months    Recent Outpatient Visits          2 weeks ago Essential hypertension   [place], [place], DO   1 month ago Essential hypertension   [place], [place], DO   2 months ago Benign essential tremor   [employer], [place], DO   3 months ago B12 deficiency   Stella P, DO   4 months ago Atrial fibrillation with rapid ventricular response [facility])   [place], Jeannette How, MD      Future Appointments            In 1 week Johnson, Barb Merino, DO [place] [place]   In 3 weeks Stoioff, Ronda Fairly, MD [place]

## 2019-04-07 DIAGNOSIS — R259 Unspecified abnormal involuntary movements: Secondary | ICD-10-CM | POA: Diagnosis not present

## 2019-04-07 DIAGNOSIS — I6350 Cerebral infarction due to unspecified occlusion or stenosis of unspecified cerebral artery: Secondary | ICD-10-CM | POA: Diagnosis not present

## 2019-04-07 DIAGNOSIS — G250 Essential tremor: Secondary | ICD-10-CM | POA: Diagnosis not present

## 2019-04-12 DIAGNOSIS — I501 Left ventricular failure: Secondary | ICD-10-CM | POA: Diagnosis not present

## 2019-04-12 DIAGNOSIS — R0902 Hypoxemia: Secondary | ICD-10-CM | POA: Diagnosis not present

## 2019-04-12 DIAGNOSIS — I2510 Atherosclerotic heart disease of native coronary artery without angina pectoris: Secondary | ICD-10-CM | POA: Diagnosis not present

## 2019-04-14 ENCOUNTER — Other Ambulatory Visit: Payer: Self-pay

## 2019-04-14 ENCOUNTER — Encounter: Payer: Self-pay | Admitting: Family Medicine

## 2019-04-14 ENCOUNTER — Ambulatory Visit (INDEPENDENT_AMBULATORY_CARE_PROVIDER_SITE_OTHER): Payer: PPO | Admitting: Family Medicine

## 2019-04-14 VITALS — BP 166/74 | HR 77

## 2019-04-14 DIAGNOSIS — E1142 Type 2 diabetes mellitus with diabetic polyneuropathy: Secondary | ICD-10-CM | POA: Diagnosis not present

## 2019-04-14 DIAGNOSIS — E1169 Type 2 diabetes mellitus with other specified complication: Secondary | ICD-10-CM

## 2019-04-14 MED ORDER — TAMSULOSIN HCL 0.4 MG PO CAPS: 0.4000 mg | ORAL_CAPSULE | Freq: Every day | ORAL | 1 refills | Status: DC

## 2019-04-14 NOTE — Assessment & Plan Note (Signed)
Will get him back in for B12 shot and his A1c. Sugars have been running in the 170s-200s. Will continue to monitor and will adjust metformin based on his A1c. Continue to monitor.

## 2019-04-14 NOTE — Progress Notes (Signed)
BP (!) 166/74   Pulse 77   SpO2 94%    Subjective:    Patient ID: Todd [name], male    DOB: 08/31/1930, 85 y.o.   MRN: [id number]  HPI: Todd [name] is a 85 y.o. male  Chief Complaint  Patient presents with  . daibetes    Patient needs diabetic shoes   DIABETES Hypoglycemic episodes:no Polydipsia/polyuria: yes Visual disturbance: yes Chest pain: no Paresthesias: yes Glucose Monitoring: yes  Accucheck frequency: Daily  Fasting glucose: 170s-200s Taking Insulin?: no Blood Pressure Monitoring: a few times a week Retinal Examination: Up to Date Foot Exam: Up to Date Diabetic Education: Completed Pneumovax: Up to Date Influenza: Up to Date Aspirin: no  Todd [name] needs diabetic shoes. He has peripheral neuropathy with callous formation and peripheral vascular disease. He is s/p stroke and has hemiplegia. He needs diabetic shoes to help him function and prevent ulcers. We will order them today.  Relevant past medical, surgical, family and social history reviewed and updated as indicated. Interim medical history since our last visit reviewed. Allergies and medications reviewed and updated.  Review of Systems  Constitutional: Negative.   Respiratory: Negative.   Cardiovascular: Negative.   Musculoskeletal: Positive for myalgias. Negative for arthralgias, back pain, gait problem, joint swelling, neck pain and neck stiffness.  Skin: Negative.   Neurological: Positive for tremors, speech difficulty and weakness. Negative for dizziness, seizures, syncope, facial asymmetry, light-headedness, numbness and headaches.  Psychiatric/Behavioral: Negative.     Per HPI unless specifically indicated above     Objective:    BP (!) 166/74   Pulse 77   SpO2 94%   Wt Readings from Last 3 Encounters:  03/17/19 177 lb (80.3 kg)  12/30/18 190 lb (86.2 kg)  12/02/18 196 lb 4 oz (89 kg)    Physical Exam Vitals signs and nursing note reviewed.  Constitutional:      General: He is not  in acute distress.    Appearance: Normal appearance. He is not ill-appearing, toxic-appearing or diaphoretic.  HENT:     Head: Normocephalic and atraumatic.     Right Ear: External ear normal.     Left Ear: External ear normal.     Nose: Nose normal.     Mouth/Throat:     Mouth: Mucous membranes are moist.     Pharynx: Oropharynx is clear.  Eyes:     General: No scleral icterus.       Right eye: No discharge.        Left eye: No discharge.     Conjunctiva/sclera: Conjunctivae normal.     Pupils: Pupils are equal, round, and reactive to light.  Neck:     Musculoskeletal: Normal range of motion.  Pulmonary:     Effort: Pulmonary effort is normal. No respiratory distress.     Comments: Speaking in full sentences Musculoskeletal: Normal range of motion.  Skin:    Coloration: Skin is not jaundiced or pale.     Findings: No bruising, erythema, lesion or rash.  Neurological:     Mental Status: He is alert and oriented to person, place, and time. Mental status is at baseline.     Comments: hemiplegia  Psychiatric:        Mood and Affect: Mood normal.        Behavior: Behavior normal.        Thought Content: Thought content normal.        Judgment: Judgment normal.     Results for orders placed  or performed in visit on [id number]  Basic metabolic panel  Result Value Ref Range   Glucose 277 (H) 65 - 99 mg/dL   BUN 15 8 - 27 mg/dL   Creatinine, Ser 1.14 0.76 - 1.27 mg/dL   GFR calc non Af Amer 58 (L) >59 mL/min/1.73   GFR calc Af Amer 67 >59 mL/min/1.73   BUN/Creatinine Ratio 13 10 - 24   Sodium 140 134 - 144 mmol/L   Potassium 4.4 3.5 - 5.2 mmol/L   Chloride 99 96 - 106 mmol/L   CO2 27 20 - 29 mmol/L   Calcium 9.4 8.6 - 10.2 mg/dL      Assessment & Plan:   Problem List Items Addressed This Visit      Endocrine   Type 2 diabetes mellitus with peripheral neuropathy (HCC) - Primary    Will get him back in for B12 shot and his A1c. Sugars have been running in the 170s-200s.  Will continue to monitor and will adjust metformin based on his A1c. Continue to monitor.           Follow up plan: Return in about 3 months (around 07/15/2019).   . This visit was completed via FaceTime due to the restrictions of the COVID-19 pandemic. All issues as above were discussed and addressed. Physical exam was done as above through visual confirmation on FaceTime. If it was felt that the patient should be evaluated in the office, they were directed there. The patient verbally consented to this visit. . Location of the patient: home . Location of the provider: home . Those involved with this call:  . Provider: Park Liter, DO . CMA: Tiffany Reel, CMA . Front Desk/Registration: Don Perking  . Time spent on call: 15 minutes with patient face to face via video conference. More than 50% of this time was spent in counseling and coordination of care. 23 minutes total spent in review of patient's record and preparation of their chart.

## 2019-04-15 ENCOUNTER — Telehealth: Payer: Self-pay

## 2019-04-15 NOTE — Telephone Encounter (Signed)
Paperwork faxed back.  Copied from [place] [phone]. Topic: General - Inquiry >> Apr 15, 2019 10:02 AM Richardo Priest, NT wrote: Reason for CRM: Butch Penny called in in regards to paperwork they had sent to office for patient's diabetic shoes. Please advise and call back is [phone].

## 2019-04-23 ENCOUNTER — Encounter: Payer: Self-pay | Admitting: Urology

## 2019-04-24 ENCOUNTER — Ambulatory Visit: Payer: PPO | Admitting: Urology

## 2019-04-28 ENCOUNTER — Ambulatory Visit: Payer: PPO | Admitting: Urology

## 2019-04-29 ENCOUNTER — Other Ambulatory Visit: Payer: Self-pay

## 2019-04-29 ENCOUNTER — Ambulatory Visit
Admission: EM | Admit: 2019-04-29 | Discharge: 2019-04-29 | Disposition: A | Discharge status: Home / Self Care | Payer: PPO | Attending: Family Medicine | Admitting: Family Medicine

## 2019-04-29 ENCOUNTER — Encounter: Payer: Self-pay | Admitting: Emergency Medicine

## 2019-04-29 DIAGNOSIS — Z20828 Contact with and (suspected) exposure to other viral communicable diseases: Secondary | ICD-10-CM

## 2019-04-29 DIAGNOSIS — Z20822 Contact with and (suspected) exposure to covid-19: Secondary | ICD-10-CM

## 2019-04-29 DIAGNOSIS — J3489 Other specified disorders of nose and nasal sinuses: Secondary | ICD-10-CM

## 2019-04-29 DIAGNOSIS — R197 Diarrhea, unspecified: Secondary | ICD-10-CM

## 2019-04-29 NOTE — ED Triage Notes (Signed)
Patient wants COVID testing. Patient has no symptoms. He was around a positive COVID person on Sunday 11/15. That person tested positive on 11/18.

## 2019-04-29 NOTE — Discharge Instructions (Signed)
Results available in 24-48 hours. ° °Take care ° °Dr. Kalden Wanke  °

## 2019-04-29 NOTE — ED Provider Notes (Signed)
[facility]    CSN: [id number] Arrival date & time: 04/29/19  1310      History   Chief Complaint Chief Complaint  Patient presents with  . COVID test   HPI  85 year old male with a complex past medical history including CVA which has resulted in aphasia presents for Covid testing.  History obtained from family member.  Family member states that he has had some rhinorrhea and has had some diarrhea recently.  No other reported symptoms.  He is on chronic oxygen.  He has been exposed to an individual who tested positive on 11/18.  No reported fever.  No other reported symptoms.  No other complaints or concerns at this time.  PMH, Surgical Hx, Family Hx, Social History reviewed and updated as below.  Past Medical History:  Diagnosis Date  . Anxiety   . Aortic regurgitation    a. 03/2017 Echo: Mod AI; b. 05/2017 Echo: Triv AI.  [name] Kitchen Arthritis    osteoarthritis-left knee  . CAD (coronary artery disease)    a. Reported h/o cath ~ 2008 - ? small vessel dzs. No PCI performed; b. 03/2017 MV: EF 45-54%, no ischemia/infarct.  . Depression   . Diabetes mellitus without complication ([place])   . Diastolic dysfunction    a. 03/2017 Echo: EF 55-65%; b. 05/2017 Echo: EF 55-60%, no rwma, Gr1 DD. Triv AI.  Mildly dil LA.  . ED (erectile dysfunction)   . Elevated troponin    a. chronic mild elevation - 0.03->0.04.  [name] Kitchen GERD (gastroesophageal reflux disease)   . Glaucoma   . Hyperlipidemia   . Hypertension   . Iron deficiency anemia due to chronic blood loss   . Left pontine stroke w/ cerebrovascular disease(HCC)    a. 05/2017 MRI/A: acute to subacute L pontine infarct. Chronic left thalamic lacunar infarct. Severe basilar artery stenosis w/ radiographic string sign of the distal 1/3. Mild to moderate L PCA P2 segment stenosis; b. 05/2017 Carotid U/S: <50% bilat ICA stenosis.  . Lumbago    Lumbosacral Neuritis  . Prostate cancer [facility])     Patient Active Problem List   Diagnosis  Date Noted  . Atrial fibrillation with rapid ventricular response ([place]) 11/11/2018  . Frequent PVCs   . Anemia due to stage 3 chronic kidney disease 09/16/2018  . Normocytic anemia 05/17/2018  . Hemiplegia ([place]) 04/25/2018  . B12 deficiency 04/25/2018  . Dysphagia as late effect of cerebrovascular accident (CVA) 04/25/2018  . Depression, major, single episode, severe ([place]) 04/25/2018  . CHF (congestive heart failure) ([place]) 01/09/2018  . Advanced care planning/counseling discussion 12/20/2017  . Hypoxia 11/02/2017  . Mixed hyperlipidemia 09/21/2017  . Aphasia 05/18/2017  . CAD (coronary artery disease) 05/18/2017  . History of stroke with residual deficit 05/18/2017  . 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 Knee joint replaced by other means 07/04/2015  . Urinary incontinence 04/27/2015  . Anxiety, generalized 04/07/2015  . History of BCG vaccination 04/07/2015  . GERD (gastroesophageal reflux disease) 03/04/2015  . Degeneration of intervertebral disc of lumbar region 02/23/2015  . Neuritis or radiculitis due to rupture of lumbar intervertebral disc 02/23/2015  . Lumbar stenosis with neurogenic claudication 02/23/2015  . Hypertension 12/02/2014  . Type 2 diabetes mellitus with peripheral neuropathy ([place]) 12/02/2014  . Benign essential tremor 03/13/2014  . Imbalance 03/13/2014  . Prostate cancer ([place]) 10/07/2012  . ED (erectile dysfunction) of organic origin 02/09/2012    Past Surgical History:  Procedure Laterality Date  . CARPAL TUNNEL  RELEASE    . REPLACEMENT TOTAL KNEE BILATERAL         Home Medications    Prior to Admission medications   Medication Sig Start Date End Date Taking? Authorizing Provider  acetaminophen (TYLENOL 8 HOUR ARTHRITIS PAIN) 650 MG CR tablet Take 650 mg by mouth every 8 (eight) hours as needed for pain.   Yes [provider]  apixaban (ELIQUIS) 5 MG TABS tablet Take 1 tablet (5 mg total) by mouth 2 (two) times daily. 12/12/18   Yes Johnson, Megan P, DO  atorvastatin (LIPITOR) 20 MG tablet Take 1 tablet (20 mg total) by mouth daily. 04/01/19  Yes Johnson, Megan P, DO  blood glucose meter kit and supplies KIT Dispense based on patient and insurance preference. Use up to four times daily as directed. (FOR ICD-9 250.00, 250.01). 11/17/18  Yes Dustin Flock, MD  Blood Glucose Monitoring Suppl (ACCU-CHEK AVIVA PLUS) w/Device KIT Use to check sugar levels x 2 daily 11/17/18  Yes Dustin Flock, MD  digoxin (LANOXIN) 0.125 MG tablet Take 1 tablet (0.125 mg total) by mouth daily. 12/12/18  Yes Johnson, Megan P, DO  DULoxetine (CYMBALTA) 60 MG capsule  01/22/19  Yes [provider]  furosemide (LASIX) 20 MG tablet  01/18/19  Yes [provider]  gabapentin (NEURONTIN) 100 MG capsule Take 1 capsule (100 mg total) by mouth 2 (two) times daily. Once a day at night 12/12/18  Yes Johnson, Megan P, DO  hydrALAZINE (APRESOLINE) 50 MG tablet Take 1 tablet (50 mg total) by mouth 3 (three) times daily. 03/17/19  Yes Johnson, Megan P, DO  Lancets (ACCU-CHEK SOFT TOUCH) lancets Use as instructed 11/17/18  Yes Dustin Flock, MD  latanoprost (XALATAN) 0.005 % ophthalmic solution Place 1 drop into both eyes at bedtime.  06/08/16  Yes [provider]  magnesium oxide (MAG-OX) 400 MG tablet Take 1 tablet (400 mg total) by mouth 2 (two) times daily. 05/10/18  Yes Dunn, Areta Haber, PA-C  metFORMIN (GLUCOPHAGE) 500 MG tablet Take 1 tablet (500 mg total) by mouth daily with breakfast. 12/12/18  Yes Johnson, Megan P, DO  naproxen sodium (ALEVE) 220 MG tablet Take 220 mg by mouth 2 (two) times daily as needed.    Yes [provider]  ONETOUCH ULTRA test strip USE TO CHECK SUGAR LEVELS TWICE DAILY. 11/18/18  Yes Crissman, Jeannette How, MD  pantoprazole (PROTONIX) 40 MG tablet Take 1 tablet (40 mg total) by mouth daily. 12/12/18  Yes Johnson, Megan P, DO  propranolol ER (INDERAL LA) 120 MG 24 hr capsule Take 1 capsule (120 mg total) by mouth  daily. 12/30/18  Yes Minna Merritts, MD  tamsulosin (FLOMAX) 0.4 MG CAPS capsule Take 1 capsule (0.4 mg total) by mouth daily. 04/14/19  Yes Valerie Roys, DO    Family History Family History  Problem Relation Age of Onset  . Stroke Mother   . Hypertension Mother   . Hypertension Father   . Stroke Sister   . Prostate cancer Neg Hx   . Kidney cancer Neg Hx   . Bladder Cancer Neg Hx     Social History Social History   Tobacco Use  . Smoking status: Former Smoker    Quit date: 12/02/1994    Years since quitting: 24.4  . Smokeless tobacco: Never Used  Substance Use Topics  . Alcohol use: Yes    Alcohol/week: 1.0 standard drinks    Types: 1 Cans of beer per week    Comment: occasional  .  Drug use: No     Allergies   Amlodipine, Fluoxetine, and Meloxicam   Review of Systems Review of Systems  Constitutional: Negative for fever.  HENT: Positive for rhinorrhea.   Gastrointestinal: Positive for diarrhea.   Physical Exam Triage Vital Signs ED Triage Vitals  Enc Vitals Group     BP 04/29/19 1350 (!) 137/58     Pulse Rate 04/29/19 1350 74     Resp 04/29/19 1350 18     Temp 04/29/19 1350 (!) 97.5 F (36.4 C)     Temp Source 04/29/19 1350 Oral     SpO2 04/29/19 1350 100 %     Weight 04/29/19 1340 185 lb (83.9 kg)     Height 04/29/19 1339 5' 7"  (1.702 m)     Head Circumference --      Peak Flow --      Pain Score 04/29/19 1339 0     Pain Loc --      Pain Edu? --      Excl. in [place]? --    Updated Vital Signs BP (!) 137/58 (BP Location: Right Arm)   Pulse 74   Temp (!) 97.5 F (36.4 C) (Oral)   Resp 18   Ht 5' 7"  (1.702 m)   Wt 83.9 kg   SpO2 100%   BMI 28.98 kg/m   Visual Acuity Right Eye Distance:   Left Eye Distance:   Bilateral Distance:    Right Eye Near:   Left Eye Near:    Bilateral Near:     Physical Exam Vitals signs and nursing note reviewed.  Constitutional:      General: He is not in acute distress.    Appearance: Normal appearance.   HENT:     Head: Normocephalic and atraumatic.  Eyes:     General:        Right eye: No discharge.        Left eye: No discharge.     Conjunctiva/sclera: Conjunctivae normal.  Cardiovascular:     Rate and Rhythm: Normal rate and regular rhythm.  Pulmonary:     Effort: Pulmonary effort is normal.     Comments: Decreased breath sounds throughout. Skin:    General: Skin is warm.     Findings: No rash.  Neurological:     Mental Status: He is alert.     Comments: Aphasia.  At baseline per family members.    UC Treatments / Results  Labs (all labs ordered are listed, but only abnormal results are displayed) Labs Reviewed  NOVEL CORONAVIRUS, NAA (HOSP ORDER, SEND-OUT TO REF LAB; TAT 18-24 HRS)    EKG   Radiology No results found.  Procedures Procedures (including critical care time)  Medications Ordered in UC Medications - No data to display  Initial Impression / Assessment and Plan / UC Course  I have reviewed the triage vital signs and the nursing notes.  Pertinent labs & imaging results that were available during my care of the patient were reviewed by me and considered in my medical decision making (see chart for details).    85 year old male presents for Covid testing.  Awaiting results.  Final Clinical Impressions(s) / UC Diagnoses   Final diagnoses:  Encounter for laboratory testing for COVID-19 virus     Discharge Instructions     Results available in 24-48 hours.  Take care  Dr. Lacinda Axon    ED Prescriptions    None     PDMP not reviewed this encounter.  Coral Spikes, [place] 04/29/19 1507

## 2019-04-30 LAB — NOVEL CORONAVIRUS, NAA (HOSP ORDER, SEND-OUT TO REF LAB; TAT 18-24 HRS): SARS-CoV-2, NAA: NOT DETECTED

## 2019-05-08 DIAGNOSIS — R350 Frequency of micturition: Secondary | ICD-10-CM | POA: Diagnosis not present

## 2019-05-08 DIAGNOSIS — R109 Unspecified abdominal pain: Secondary | ICD-10-CM | POA: Diagnosis not present

## 2019-05-08 DIAGNOSIS — Z9114 Patient's other noncompliance with medication regimen: Secondary | ICD-10-CM | POA: Diagnosis not present

## 2019-05-12 DIAGNOSIS — I2510 Atherosclerotic heart disease of native coronary artery without angina pectoris: Secondary | ICD-10-CM | POA: Diagnosis not present

## 2019-05-12 DIAGNOSIS — I501 Left ventricular failure: Secondary | ICD-10-CM | POA: Diagnosis not present

## 2019-05-12 DIAGNOSIS — R0902 Hypoxemia: Secondary | ICD-10-CM | POA: Diagnosis not present

## 2019-05-14 DIAGNOSIS — E1165 Type 2 diabetes mellitus with hyperglycemia: Secondary | ICD-10-CM | POA: Diagnosis not present

## 2019-05-15 DIAGNOSIS — I10 Essential (primary) hypertension: Secondary | ICD-10-CM | POA: Diagnosis not present

## 2019-05-15 DIAGNOSIS — E119 Type 2 diabetes mellitus without complications: Secondary | ICD-10-CM | POA: Diagnosis not present

## 2019-05-15 DIAGNOSIS — N400 Enlarged prostate without lower urinary tract symptoms: Secondary | ICD-10-CM | POA: Diagnosis not present

## 2019-05-15 DIAGNOSIS — I6990 Unspecified sequelae of unspecified cerebrovascular disease: Secondary | ICD-10-CM | POA: Diagnosis not present

## 2019-05-23 ENCOUNTER — Other Ambulatory Visit: Payer: Self-pay

## 2019-05-23 ENCOUNTER — Encounter: Payer: Self-pay | Admitting: Urology

## 2019-05-23 ENCOUNTER — Ambulatory Visit (INDEPENDENT_AMBULATORY_CARE_PROVIDER_SITE_OTHER): Payer: PPO | Admitting: Urology

## 2019-05-23 VITALS — BP 162/84 | HR 72 | Ht 67.0 in

## 2019-05-23 DIAGNOSIS — N3941 Urge incontinence: Secondary | ICD-10-CM

## 2019-05-23 DIAGNOSIS — C61 Malignant neoplasm of prostate: Secondary | ICD-10-CM | POA: Diagnosis not present

## 2019-05-23 MED ORDER — LEUPROLIDE ACETATE (6 MONTH) 45 MG ~~LOC~~ KIT
45.0000 mg | PACK | Freq: Once | SUBCUTANEOUS | Status: AC
  Administered 2019-05-23: 45 mg via SUBCUTANEOUS

## 2019-05-23 NOTE — Progress Notes (Signed)
05/23/2019 2:41 PM   Todd [name] [birth year] [id number]  Referring provider: Guadalupe Maple, MD [address] [place],  [place] [address]  Chief Complaint  Patient presents with  . Prostate Cancer    Urologic history: 1.  T1c prostate cancer -Diagnosed 2002 -Primary treatment IMRT/brachytherapy -ADT was started for biochemical recurrence sometime prior to 2013 -Was initially on intermittent ablation and subsequently changed to continuous  2.  Lower urinary tract symptoms -Primarily storage related voiding symptoms -Cystoscopy 04/2015 without stricture or mucosal abnormalities   HPI: 85 y.o. male presents for follow-up visit.  His last leuprolide injection was November 2019.  He was due for an injection May 2020 however had a telephone visit secondary to the Covid pandemic and did not come in for an injection.  His PSA May 2020 was 3.5.  He has stable lower urinary tract symptoms.  He is on tamsulosin.  No longer on Myrbetriq.  Denies gross hematuria.   PMH: Past Medical History:  Diagnosis Date  . Anxiety   . Aortic regurgitation    a. 03/2017 Echo: Mod AI; b. 05/2017 Echo: Triv AI.  [name] Kitchen Arthritis    osteoarthritis-left knee  . CAD (coronary artery disease)    a. Reported h/o cath ~ 2008 - ? small vessel dzs. No PCI performed; b. 03/2017 MV: EF 45-54%, no ischemia/infarct.  . Depression   . Diabetes mellitus without complication ([place])   . Diastolic dysfunction    a. 03/2017 Echo: EF 55-65%; b. 05/2017 Echo: EF 55-60%, no rwma, Gr1 DD. Triv AI.  Mildly dil LA.  . ED (erectile dysfunction)   . Elevated troponin    a. chronic mild elevation - 0.03->0.04.  [name] Kitchen GERD (gastroesophageal reflux disease)   . Glaucoma   . Hyperlipidemia   . Hypertension   . Iron deficiency anemia due to chronic blood loss   . Left pontine stroke w/ cerebrovascular disease(HCC)    a. 05/2017 MRI/A: acute to subacute L pontine infarct. Chronic left thalamic lacunar infarct. Severe basilar  artery stenosis w/ radiographic string sign of the distal 1/3. Mild to moderate L PCA P2 segment stenosis; b. 05/2017 Carotid U/S: <50% bilat ICA stenosis.  . Lumbago    Lumbosacral Neuritis  . Prostate cancer [facility])     Surgical History: Past Surgical History:  Procedure Laterality Date  . CARPAL TUNNEL RELEASE    . REPLACEMENT TOTAL KNEE BILATERAL      Home Medications:  Allergies as of 05/23/2019      Reactions   Amlodipine    Lower extremity swelling at 10 mg.   Fluoxetine Other (See Comments)   headache   Meloxicam Other (See Comments)   Other reaction(s): Dizziness Nervousness.      Medication List       Accurate as of May 23, 2019  2:41 PM. If you have any questions, ask your nurse or doctor.        Accu-Chek Aviva Plus w/Device Kit Use to check sugar levels x 2 daily   accu-chek soft touch lancets Use as instructed   apixaban 5 MG Tabs tablet Commonly known as: ELIQUIS Take 1 tablet (5 mg total) by mouth 2 (two) times daily.   atorvastatin 20 MG tablet Commonly known as: LIPITOR Take 1 tablet (20 mg total) by mouth daily.   blood glucose meter kit and supplies Kit Dispense based on patient and insurance preference. Use up to four times daily as directed. (FOR ICD-9 250.00, 250.01).   digoxin 0.125 MG  tablet Commonly known as: LANOXIN Take 1 tablet (0.125 mg total) by mouth daily.   DULoxetine 60 MG capsule Commonly known as: CYMBALTA   furosemide 20 MG tablet Commonly known as: LASIX   gabapentin 100 MG capsule Commonly known as: NEURONTIN Take 1 capsule (100 mg total) by mouth 2 (two) times daily. Once a day at night   hydrALAZINE 50 MG tablet Commonly known as: APRESOLINE Take 1 tablet (50 mg total) by mouth 3 (three) times daily.   latanoprost 0.005 % ophthalmic solution Commonly known as: XALATAN Place 1 drop into both eyes at bedtime.   magnesium oxide 400 MG tablet Commonly known as: MAG-OX Take 1 tablet (400 mg total) by  mouth 2 (two) times daily.   metFORMIN 500 MG tablet Commonly known as: GLUCOPHAGE Take 1 tablet (500 mg total) by mouth daily with breakfast.   naproxen sodium 220 MG tablet Commonly known as: ALEVE Take 220 mg by mouth 2 (two) times daily as needed.   OneTouch Ultra test strip Generic drug: glucose blood USE TO CHECK SUGAR LEVELS TWICE DAILY.   pantoprazole 40 MG tablet Commonly known as: PROTONIX Take 1 tablet (40 mg total) by mouth daily.   propranolol ER 120 MG 24 hr capsule Commonly known as: INDERAL LA Take 1 capsule (120 mg total) by mouth daily.   tamsulosin 0.4 MG Caps capsule Commonly known as: FLOMAX Take 1 capsule (0.4 mg total) by mouth daily.   Tylenol 8 Hour Arthritis Pain 650 MG CR tablet Generic drug: acetaminophen Take 650 mg by mouth every 8 (eight) hours as needed for pain.       Allergies:  Allergies  Allergen Reactions  . Amlodipine     Lower extremity swelling at 10 mg.  . Fluoxetine Other (See Comments)    headache  . Meloxicam Other (See Comments)    Other reaction(s): Dizziness Nervousness.     Family History: Family History  Problem Relation Age of Onset  . Stroke Mother   . Hypertension Mother   . Hypertension Father   . Stroke Sister   . Prostate cancer Neg Hx   . Kidney cancer Neg Hx   . Bladder Cancer Neg Hx     Social History:  reports that he quit smoking about 24 years ago. He has never used smokeless tobacco. He reports current alcohol use of about 1.0 standard drinks of alcohol per week. He reports that he does not use drugs.  ROS: UROLOGY Frequent Urination?: No Hard to postpone urination?: No Burning/pain with urination?: No Get up at night to urinate?: Yes Leakage of urine?: No Urine stream starts and stops?: No Trouble starting stream?: No Do you have to strain to urinate?: No Blood in urine?: No Urinary tract infection?: No Sexually transmitted disease?: No Injury to kidneys or bladder?: No Painful  intercourse?: No Weak stream?: No Erection problems?: No Penile pain?: No  Gastrointestinal Nausea?: No Vomiting?: No Indigestion/heartburn?: No Diarrhea?: No Constipation?: No  Constitutional Fever: No Night sweats?: No Weight loss?: No Fatigue?: No  Skin Skin rash/lesions?: No Itching?: No  Eyes Blurred vision?: No Double vision?: No  Ears/Nose/Throat Sore throat?: No Sinus problems?: No  Hematologic/Lymphatic Swollen glands?: No Easy bruising?: No  Cardiovascular Leg swelling?: Yes Chest pain?: No  Respiratory Cough?: No Shortness of breath?: Yes  Endocrine Excessive thirst?: No  Musculoskeletal Back pain?: Yes Joint pain?: No  Neurological Headaches?: No Dizziness?: No  Psychologic Depression?: Yes Anxiety?: No  Physical Exam: BP (!) 162/84  Pulse 72   Ht _0  (1.702 m)   BMI 28.98 kg/m   Constitutional:  Alert, No acute distress. HEENT: [place] AT, moist mucus membranes.  Trachea midline, no masses. Cardiovascular: No clubbing, cyanosis, or edema. Respiratory: Normal respiratory effort, no increased work of breathing. Skin: No rashes, bruises or suspicious lesions.   Assessment & Plan:    - Prostate cancer PSA was drawn today.  He did receive an Eligard injection.  Follow-up 6 months.  - Lower urinary tract symptoms Stable storage related voiding symptoms.   Abbie Sons, [place] [address] [place],  [address] [phone]

## 2019-05-23 NOTE — Progress Notes (Signed)
Eligard SubQ Injection   Due to Prostate Cancer patient is present today for a Eligard Injection.  Medication: Eligard 6 month Dose: 45 mg  Location: left lower abdominal tissue Lot: [id number] Exp: 12/02/2020  Patient tolerated well, no complications were noted  Performed by: Verlene Mayer, CMA  Per Dr. Bernardo Heater patient is to continue therapy for 6 months . Patient's next follow up was scheduled for 6 months. This appointment was scheduled using wheel and given to patient today along with reminder continue on Vitamin D 800-1000iu and Calium 1000-1200mg  daily while on Androgen Deprivation Therapy.

## 2019-05-24 LAB — PSA: Prostate Specific Ag, Serum: 2.6 ng/mL (ref 0.0–4.0)

## 2019-05-25 ENCOUNTER — Encounter: Payer: Self-pay | Admitting: Urology

## 2019-05-26 ENCOUNTER — Telehealth: Payer: Self-pay | Admitting: *Deleted

## 2019-05-26 NOTE — Telephone Encounter (Addendum)
Patient's daughter informed-voiced understanding.  ----- Message from Abbie Sons, MD sent at 05/25/2019  9:39 AM EST ----- PSA stable 2.6

## 2019-06-07 ENCOUNTER — Other Ambulatory Visit: Payer: Self-pay | Admitting: Family Medicine

## 2019-06-12 ENCOUNTER — Telehealth: Payer: Self-pay

## 2019-06-12 DIAGNOSIS — I501 Left ventricular failure: Secondary | ICD-10-CM | POA: Diagnosis not present

## 2019-06-12 DIAGNOSIS — I2510 Atherosclerotic heart disease of native coronary artery without angina pectoris: Secondary | ICD-10-CM | POA: Diagnosis not present

## 2019-06-12 DIAGNOSIS — R0902 Hypoxemia: Secondary | ICD-10-CM | POA: Diagnosis not present

## 2019-06-12 NOTE — Telephone Encounter (Signed)
Copied from [place] [id number]. Topic: General - Inquiry >> Jun 12, 2019  8:42 AM Richardo Priest, NT wrote: Reason for CRM: Pt's daughter called in stating she is wanting to know what medication will be substituting pt's gabepentin, since at last visit it was discussed to start gradually reducing. Pt's daughter states tremors are starting to get bad and would like to know what to do. Please advise.   Routing to provider to advise.

## 2019-06-13 NOTE — Telephone Encounter (Signed)
This is for Neurology. They are the ones decreasing the gabapentin. Please have her call [facility] neuro

## 2019-06-13 NOTE — Telephone Encounter (Signed)
Called and left a message for patients daughter letting er know to contact [facility].

## 2019-06-16 ENCOUNTER — Other Ambulatory Visit: Payer: Self-pay | Admitting: Family Medicine

## 2019-06-19 ENCOUNTER — Other Ambulatory Visit: Payer: Self-pay | Admitting: Family Medicine

## 2019-07-01 ENCOUNTER — Other Ambulatory Visit: Payer: Self-pay | Admitting: Family Medicine

## 2019-07-01 ENCOUNTER — Other Ambulatory Visit: Payer: Self-pay | Admitting: Physician Assistant

## 2019-07-01 NOTE — Telephone Encounter (Signed)
Refill request for DULOXETINE HCL DR 60 MG CAP. Historical provider and historical medication. Pt also needs a PHQ 2 or PHQ 9.

## 2019-07-09 DIAGNOSIS — M25562 Pain in left knee: Secondary | ICD-10-CM | POA: Diagnosis not present

## 2019-07-09 DIAGNOSIS — M25512 Pain in left shoulder: Secondary | ICD-10-CM | POA: Diagnosis not present

## 2019-07-09 DIAGNOSIS — Z9181 History of falling: Secondary | ICD-10-CM | POA: Diagnosis not present

## 2019-07-09 DIAGNOSIS — I2510 Atherosclerotic heart disease of native coronary artery without angina pectoris: Secondary | ICD-10-CM | POA: Diagnosis not present

## 2019-07-09 DIAGNOSIS — D869 Sarcoidosis, unspecified: Secondary | ICD-10-CM | POA: Diagnosis not present

## 2019-07-09 DIAGNOSIS — E119 Type 2 diabetes mellitus without complications: Secondary | ICD-10-CM | POA: Diagnosis not present

## 2019-07-09 DIAGNOSIS — I69351 Hemiplegia and hemiparesis following cerebral infarction affecting right dominant side: Secondary | ICD-10-CM | POA: Diagnosis not present

## 2019-07-13 DIAGNOSIS — I501 Left ventricular failure: Secondary | ICD-10-CM | POA: Diagnosis not present

## 2019-07-13 DIAGNOSIS — I2510 Atherosclerotic heart disease of native coronary artery without angina pectoris: Secondary | ICD-10-CM | POA: Diagnosis not present

## 2019-07-13 DIAGNOSIS — R0902 Hypoxemia: Secondary | ICD-10-CM | POA: Diagnosis not present

## 2019-07-20 DIAGNOSIS — E119 Type 2 diabetes mellitus without complications: Secondary | ICD-10-CM | POA: Diagnosis not present

## 2019-07-20 DIAGNOSIS — I2510 Atherosclerotic heart disease of native coronary artery without angina pectoris: Secondary | ICD-10-CM | POA: Diagnosis not present

## 2019-07-20 DIAGNOSIS — G4700 Insomnia, unspecified: Secondary | ICD-10-CM | POA: Diagnosis not present

## 2019-07-20 DIAGNOSIS — D869 Sarcoidosis, unspecified: Secondary | ICD-10-CM | POA: Diagnosis not present

## 2019-07-20 DIAGNOSIS — G8190 Hemiplegia, unspecified affecting unspecified side: Secondary | ICD-10-CM | POA: Diagnosis not present

## 2019-07-20 DIAGNOSIS — K219 Gastro-esophageal reflux disease without esophagitis: Secondary | ICD-10-CM | POA: Diagnosis not present

## 2019-07-20 DIAGNOSIS — I10 Essential (primary) hypertension: Secondary | ICD-10-CM | POA: Diagnosis not present

## 2019-07-21 ENCOUNTER — Ambulatory Visit (INDEPENDENT_AMBULATORY_CARE_PROVIDER_SITE_OTHER): Payer: PPO | Admitting: Family Medicine

## 2019-07-21 ENCOUNTER — Other Ambulatory Visit: Payer: Self-pay

## 2019-07-21 ENCOUNTER — Encounter: Payer: Self-pay | Admitting: Family Medicine

## 2019-07-21 VITALS — BP 127/58 | HR 72 | Temp 97.6°F

## 2019-07-21 DIAGNOSIS — D631 Anemia in chronic kidney disease: Secondary | ICD-10-CM | POA: Diagnosis not present

## 2019-07-21 DIAGNOSIS — K219 Gastro-esophageal reflux disease without esophagitis: Secondary | ICD-10-CM | POA: Diagnosis not present

## 2019-07-21 DIAGNOSIS — I69359 Hemiplegia and hemiparesis following cerebral infarction affecting unspecified side: Secondary | ICD-10-CM

## 2019-07-21 DIAGNOSIS — E538 Deficiency of other specified B group vitamins: Secondary | ICD-10-CM

## 2019-07-21 DIAGNOSIS — R4701 Aphasia: Secondary | ICD-10-CM

## 2019-07-21 DIAGNOSIS — N183 Chronic kidney disease, stage 3 unspecified: Secondary | ICD-10-CM

## 2019-07-21 DIAGNOSIS — E782 Mixed hyperlipidemia: Secondary | ICD-10-CM | POA: Diagnosis not present

## 2019-07-21 DIAGNOSIS — I5042 Chronic combined systolic (congestive) and diastolic (congestive) heart failure: Secondary | ICD-10-CM | POA: Diagnosis not present

## 2019-07-21 DIAGNOSIS — I10 Essential (primary) hypertension: Secondary | ICD-10-CM | POA: Diagnosis not present

## 2019-07-21 DIAGNOSIS — E1142 Type 2 diabetes mellitus with diabetic polyneuropathy: Secondary | ICD-10-CM | POA: Diagnosis not present

## 2019-07-21 LAB — BAYER DCA HB A1C WAIVED: HB A1C (BAYER DCA - WAIVED): 8.2 % — ABNORMAL HIGH (ref <7.0)

## 2019-07-21 MED ORDER — METFORMIN HCL 500 MG PO TABS: 500.0000 mg | ORAL_TABLET | Freq: Two times a day (BID) | ORAL | 1 refills | Status: DC

## 2019-07-21 NOTE — Assessment & Plan Note (Signed)
Under good control on current regimen. Continue current regimen. Continue to monitor. Call with any concerns. Refills given. Labs drawn today.   

## 2019-07-21 NOTE — Assessment & Plan Note (Signed)
Euvolemic today. Continue to follow with cardiology. Continue to monitor. Call with any concerns.  

## 2019-07-21 NOTE — Assessment & Plan Note (Signed)
Due for B12 shot- given today. 

## 2019-07-21 NOTE — Assessment & Plan Note (Signed)
Stable. Continue to follow with neurology. Has help at home. Continue to monitor.

## 2019-07-21 NOTE — Assessment & Plan Note (Signed)
Sugars running high with A1c of 8.2- will increase metformin to BID and recheck 3 months. Call with any concerns.

## 2019-07-21 NOTE — Progress Notes (Signed)
BP (!) 127/58   Pulse 72   Temp 97.6 F (36.4 C)   SpO2 100%    Subjective:    Patient ID: Todd [name], male    DOB: [birth year], 86 y.o.   MRN: [id number]  HPI: Todd [name] is a 86 y.o. male  Chief Complaint  Patient presents with  . Diabetes  . Hypertension  . Hyperlipidemia  . Depression   DIABETES Hypoglycemic episodes:no Polydipsia/polyuria: no Visual disturbance: no Chest pain: no Paresthesias: no Glucose Monitoring: yes  Accucheck frequency: Daily Taking Insulin?: no Blood Pressure Monitoring: not checking Retinal Examination: Up to Date Foot Exam: Up to Date Diabetic Education: Completed Pneumovax: Up to Date Influenza: Up to Date Aspirin: no  HYPERTENSION / [place] Satisfied with current treatment? yes Duration of hypertension: chronic BP monitoring frequency: not checking BP medication side effects: no Past BP meds: hydralazine, lasix, digoxin Duration of hyperlipidemia: chronic Cholesterol medication side effects: no Cholesterol supplements: none Past cholesterol medications: atorvastatin Medication compliance: excellent compliance Aspirin: no Recent stressors: yes Recurrent headaches: no Visual changes: no Palpitations: no Dyspnea: no Chest pain: no Lower extremity edema: no Dizzy/lightheaded: no  DEPRESSION Mood status: stable Satisfied with current treatment?: yes Symptom severity: moderate  Duration of current treatment : chronic Side effects: no Medication compliance: excellent compliance Psychotherapy/counseling: no  Previous psychiatric medications: cymbalta Depressed mood: yes Anxious mood: yes Anhedonia: no Significant weight loss or gain: no Insomnia: no  Fatigue: yes Feelings of worthlessness or guilt: yes Impaired concentration/indecisiveness: no Suicidal ideations: no Hopelessness: yes Crying spells: yes Depression screen [facility] 2/9 04/25/2018 10/26/2017 07/04/2017 06/13/2017 05/07/2017  Decreased Interest 3 0 0 0  0  Down, Depressed, Hopeless 3 0 1 0 0  PHQ - 2 Score 6 0 1 0 0  Altered sleeping 2 - - - -  Tired, decreased energy 2 - - - -  Change in appetite 1 - - - -  Feeling bad or failure about yourself  3 - - - -  Trouble concentrating 0 - - - -  Moving slowly or fidgety/restless 3 - - - -  Suicidal thoughts 2 - - - -  PHQ-9 Score 19 - - - -  Difficult doing work/chores Very difficult - - - -  Some recent data might be hidden    Relevant past medical, surgical, family and social history reviewed and updated as indicated. Interim medical history since our last visit reviewed. Allergies and medications reviewed and updated.  Review of Systems  Constitutional: Negative.   HENT: Negative.   Respiratory: Negative.   Cardiovascular: Negative.   Musculoskeletal: Negative.   Skin: Negative.   Neurological: Positive for tremors, speech difficulty and weakness. Negative for seizures, syncope, light-headedness, numbness and headaches.  Psychiatric/Behavioral: Positive for dysphoric mood. Negative for agitation, behavioral problems, confusion, decreased concentration, hallucinations, self-injury, sleep disturbance and suicidal ideas. The patient is nervous/anxious. The patient is not hyperactive.     Per HPI unless specifically indicated above     Objective:    BP (!) 127/58   Pulse 72   Temp 97.6 F (36.4 C)   SpO2 100%   Wt Readings from Last 3 Encounters:  04/29/19 185 lb (83.9 kg)  03/17/19 177 lb (80.3 kg)  12/30/18 190 lb (86.2 kg)    Physical Exam Vitals and nursing note reviewed.  Constitutional:      General: He is not in acute distress.    Appearance: Normal appearance. He is not ill-appearing, toxic-appearing or diaphoretic.  HENT:  Head: Normocephalic and atraumatic.     Right Ear: External ear normal.     Left Ear: External ear normal.     Nose: Nose normal.     Mouth/Throat:     Mouth: Mucous membranes are moist.     Pharynx: Oropharynx is clear.  Eyes:      General: No scleral icterus.       Right eye: No discharge.        Left eye: No discharge.     Extraocular Movements: Extraocular movements intact.     Conjunctiva/sclera: Conjunctivae normal.     Pupils: Pupils are equal, round, and reactive to light.  Cardiovascular:     Rate and Rhythm: Normal rate and regular rhythm.     Pulses: Normal pulses.     Heart sounds: Normal heart sounds. No murmur. No friction rub. No gallop.   Pulmonary:     Effort: Pulmonary effort is normal. No respiratory distress.     Breath sounds: Normal breath sounds. No stridor. No wheezing, rhonchi or rales.  Chest:     Chest wall: No tenderness.  Musculoskeletal:        General: Normal range of motion.     Cervical back: Normal range of motion and neck supple.  Skin:    General: Skin is warm and dry.     Capillary Refill: Capillary refill takes less than 2 seconds.     Coloration: Skin is not jaundiced or pale.     Findings: No bruising, erythema, lesion or rash.  Neurological:     General: No focal deficit present.     Mental Status: He is alert and oriented to person, place, and time. Mental status is at baseline.     Comments: Aphasic, hemiplegia, wheelchair bound  Psychiatric:        Mood and Affect: Mood normal.        Behavior: Behavior normal.        Thought Content: Thought content normal.        Judgment: Judgment normal.     Results for orders placed or performed in visit on 05/23/19  PSA  Result Value Ref Range   Prostate Specific Ag, Serum 2.6 0.0 - 4.0 ng/mL      Assessment & Plan:   Problem List Items Addressed This Visit      Cardiovascular and Mediastinum   Hypertension    Under good control on current regimen. Continue current regimen. Continue to monitor. Call with any concerns. Refills given. Labs drawn today.        Relevant Orders   Comprehensive metabolic panel   CHF (congestive heart failure) ([place])    Euvolemic today. Continue to follow with cardiology. Continue  to monitor. Call with any concerns.         Digestive   GERD (gastroesophageal reflux disease)    Under good control on current regimen. Continue current regimen. Continue to monitor. Call with any concerns. Refills given. Labs drawn today.        Endocrine   Type 2 diabetes mellitus with peripheral neuropathy (HCC) - Primary    Sugars running high with A1c of 8.2- will increase metformin to BID and recheck 3 months. Call with any concerns.       Relevant Medications   carbidopa-levodopa (SINEMET IR) 25-100 MG tablet   metFORMIN (GLUCOPHAGE) 500 MG tablet   Other Relevant Orders   Bayer DCA Hb A1c Waived   Comprehensive metabolic panel     Nervous and  Auditory   Hemiplegia (HCC)    Stable. Continue to follow with neurology. Has help at home. Continue to monitor.         Genitourinary   Anemia due to stage 3 chronic kidney disease    Rechecking levels today. Await results. Treat as needed.       Relevant Orders   CBC with Differential/Platelet   Comprehensive metabolic panel     Other   Aphasia    Stable. Continue to follow with neurology. Has help at home. Continue to monitor.       Mixed hyperlipidemia    Under good control on current regimen. Continue current regimen. Continue to monitor. Call with any concerns. Refills given. Labs drawn today.       Relevant Orders   Comprehensive metabolic panel   Lipid Panel w/o Chol/HDL Ratio   B12 deficiency    Due for B12 shot- given today.          Follow up plan: Return in about 3 months (around 10/18/2019).

## 2019-07-21 NOTE — Assessment & Plan Note (Signed)
Rechecking levels today. Await results. Treat as needed.  

## 2019-07-22 ENCOUNTER — Other Ambulatory Visit: Payer: Self-pay | Admitting: Family Medicine

## 2019-07-22 LAB — CBC WITH DIFFERENTIAL/PLATELET
Basophils Absolute: 0.1 10*3/uL (ref 0.0–0.2)
Basos: 1 %
EOS (ABSOLUTE): 0.1 10*3/uL (ref 0.0–0.4)
Eos: 2 %
Hematocrit: 29.2 % — ABNORMAL LOW (ref 37.5–51.0)
Hemoglobin: 9.7 g/dL — ABNORMAL LOW (ref 13.0–17.7)
Immature Grans (Abs): 0.1 10*3/uL (ref 0.0–0.1)
Immature Granulocytes: 1 %
Lymphocytes Absolute: 1.1 10*3/uL (ref 0.7–3.1)
Lymphs: 19 %
MCH: 30.6 pg (ref 26.6–33.0)
MCHC: 33.2 g/dL (ref 31.5–35.7)
MCV: 92 fL (ref 79–97)
Monocytes Absolute: 0.6 10*3/uL (ref 0.1–0.9)
Monocytes: 11 %
Neutrophils Absolute: 3.8 10*3/uL (ref 1.4–7.0)
Neutrophils: 66 %
Platelets: 238 10*3/uL (ref 150–450)
RBC: 3.17 x10E6/uL — ABNORMAL LOW (ref 4.14–5.80)
RDW: 11 % — ABNORMAL LOW (ref 11.6–15.4)
WBC: 5.8 10*3/uL (ref 3.4–10.8)

## 2019-07-22 LAB — COMPREHENSIVE METABOLIC PANEL
ALT: 9 IU/L (ref 0–44)
AST: 14 IU/L (ref 0–40)
Albumin/Globulin Ratio: 1.6 (ref 1.2–2.2)
Albumin: 3.9 g/dL (ref 3.6–4.6)
Alkaline Phosphatase: 82 IU/L (ref 39–117)
BUN/Creatinine Ratio: 13 (ref 10–24)
BUN: 14 mg/dL (ref 8–27)
Bilirubin Total: 0.4 mg/dL (ref 0.0–1.2)
CO2: 30 mmol/L — ABNORMAL HIGH (ref 20–29)
Calcium: 9.2 mg/dL (ref 8.6–10.2)
Chloride: 97 mmol/L (ref 96–106)
Creatinine, Ser: 1.07 mg/dL (ref 0.76–1.27)
GFR calc Af Amer: 72 mL/min/{1.73_m2} (ref >59)
GFR calc non Af Amer: 63 mL/min/{1.73_m2} (ref >59)
Globulin, Total: 2.4 g/dL (ref 1.5–4.5)
Glucose: 286 mg/dL — ABNORMAL HIGH (ref 65–99)
Potassium: 4.1 mmol/L (ref 3.5–5.2)
Sodium: 140 mmol/L (ref 134–144)
Total Protein: 6.3 g/dL (ref 6.0–8.5)

## 2019-07-22 LAB — LIPID PANEL W/O CHOL/HDL RATIO
Cholesterol, Total: 120 mg/dL (ref 100–199)
HDL: 34 mg/dL — ABNORMAL LOW (ref >39)
LDL Chol Calc (NIH): 61 mg/dL (ref 0–99)
Triglycerides: 143 mg/dL (ref 0–149)
VLDL Cholesterol Cal: 25 mg/dL (ref 5–40)

## 2019-07-25 ENCOUNTER — Other Ambulatory Visit: Payer: Self-pay | Admitting: Family Medicine

## 2019-07-25 DIAGNOSIS — D649 Anemia, unspecified: Secondary | ICD-10-CM

## 2019-07-28 DIAGNOSIS — D869 Sarcoidosis, unspecified: Secondary | ICD-10-CM | POA: Diagnosis not present

## 2019-07-28 DIAGNOSIS — I2510 Atherosclerotic heart disease of native coronary artery without angina pectoris: Secondary | ICD-10-CM | POA: Diagnosis not present

## 2019-07-28 DIAGNOSIS — M25512 Pain in left shoulder: Secondary | ICD-10-CM | POA: Diagnosis not present

## 2019-07-28 DIAGNOSIS — Z9181 History of falling: Secondary | ICD-10-CM | POA: Diagnosis not present

## 2019-07-28 DIAGNOSIS — M25562 Pain in left knee: Secondary | ICD-10-CM | POA: Diagnosis not present

## 2019-07-28 DIAGNOSIS — H401131 Primary open-angle glaucoma, bilateral, mild stage: Secondary | ICD-10-CM | POA: Diagnosis not present

## 2019-07-28 DIAGNOSIS — I69351 Hemiplegia and hemiparesis following cerebral infarction affecting right dominant side: Secondary | ICD-10-CM | POA: Diagnosis not present

## 2019-07-28 DIAGNOSIS — E119 Type 2 diabetes mellitus without complications: Secondary | ICD-10-CM | POA: Diagnosis not present

## 2019-08-10 DIAGNOSIS — I2510 Atherosclerotic heart disease of native coronary artery without angina pectoris: Secondary | ICD-10-CM | POA: Diagnosis not present

## 2019-08-10 DIAGNOSIS — R0902 Hypoxemia: Secondary | ICD-10-CM | POA: Diagnosis not present

## 2019-08-10 DIAGNOSIS — I501 Left ventricular failure: Secondary | ICD-10-CM | POA: Diagnosis not present

## 2019-08-16 ENCOUNTER — Other Ambulatory Visit: Payer: Self-pay | Admitting: Nurse Practitioner

## 2019-08-26 ENCOUNTER — Other Ambulatory Visit: Payer: Self-pay | Admitting: Family Medicine

## 2019-08-27 ENCOUNTER — Ambulatory Visit (INDEPENDENT_AMBULATORY_CARE_PROVIDER_SITE_OTHER): Payer: PPO

## 2019-08-27 ENCOUNTER — Telehealth: Payer: Self-pay | Admitting: Family Medicine

## 2019-08-27 ENCOUNTER — Other Ambulatory Visit: Payer: Self-pay

## 2019-08-27 DIAGNOSIS — E538 Deficiency of other specified B group vitamins: Secondary | ICD-10-CM | POA: Diagnosis not present

## 2019-08-27 NOTE — Telephone Encounter (Signed)
Routing to provider  

## 2019-08-27 NOTE — Telephone Encounter (Signed)
Pt is requesting stoll s

## 2019-08-27 NOTE — Telephone Encounter (Signed)
Pt' daughter(DPR) is requesting stool softener for pt.Please advise.

## 2019-08-28 MED ORDER — DOCUSATE SODIUM 100 MG PO CAPS: 100.0000 mg | ORAL_CAPSULE | Freq: Two times a day (BID) | ORAL | 1 refills | Status: DC | PRN

## 2019-08-28 NOTE — Addendum Note (Signed)
Addended by: Valerie Roys on: 08/28/2019 02:44 PM   Modules accepted: Orders

## 2019-09-05 ENCOUNTER — Other Ambulatory Visit: Payer: Self-pay | Admitting: Family Medicine

## 2019-09-10 DIAGNOSIS — I501 Left ventricular failure: Secondary | ICD-10-CM | POA: Diagnosis not present

## 2019-09-10 DIAGNOSIS — R0902 Hypoxemia: Secondary | ICD-10-CM | POA: Diagnosis not present

## 2019-09-10 DIAGNOSIS — I2510 Atherosclerotic heart disease of native coronary artery without angina pectoris: Secondary | ICD-10-CM | POA: Diagnosis not present

## 2019-09-17 ENCOUNTER — Other Ambulatory Visit: Payer: Self-pay | Admitting: Family Medicine

## 2019-09-18 ENCOUNTER — Other Ambulatory Visit: Payer: Self-pay | Admitting: Family Medicine

## 2019-09-18 NOTE — Telephone Encounter (Signed)
Patient last seen 07/21/19 and has appointment 11/10/19.

## 2019-09-18 NOTE — Telephone Encounter (Signed)
Requested medication (s) are due for refill today: Yes  Requested medication (s) are on the active medication list: Yes  Last refill:  02/11/19  Future visit scheduled: Yes  Notes to clinic:  Historical provider.    Requested Prescriptions  Pending Prescriptions Disp Refills   furosemide (LASIX) 20 MG tablet [Pharmacy Med Name: FUROSEMIDE 20 MG TABLET] 30 tablet 0    Sig: Take 1 tablet (20 mg total) by mouth daily.      Cardiovascular:  Diuretics - Loop Passed - 09/18/2019  4:22 PM      Passed - K in normal range and within 360 days    Potassium  Date Value Ref Range Status  07/21/2019 4.1 3.5 - 5.2 mmol/L Final          Passed - Ca in normal range and within 360 days    Calcium  Date Value Ref Range Status  07/21/2019 9.2 8.6 - 10.2 mg/dL Final          Passed - Na in normal range and within 360 days    Sodium  Date Value Ref Range Status  07/21/2019 140 134 - 144 mmol/L Final          Passed - Cr in normal range and within 360 days    Creatinine, Ser  Date Value Ref Range Status  07/21/2019 1.07 0.76 - 1.27 mg/dL Final          Passed - Last BP in normal range    BP Readings from Last 1 Encounters:  07/21/19 (!) 127/58          Passed - Valid encounter within last 6 months    Recent Outpatient Visits           1 month ago Type 2 diabetes mellitus with peripheral neuropathy ([place])   [place], Megan P, DO   5 months ago Type 2 diabetes mellitus with peripheral neuropathy ([place])   [place], Megan P, DO   6 months ago Essential hypertension   [place], [place], DO   7 months ago Essential hypertension   [place], [place], DO   8 months ago Benign essential tremor   [employer], [place], DO       Future Appointments             Tomorrow [place], Malachi Bonds D, PA-C [place], [facility]   In 1 month [place], Megan P, DO  [employer], [place]   In 2 months [place], Ronda Fairly, MD [facility]

## 2019-09-19 ENCOUNTER — Encounter: Payer: Self-pay | Admitting: Physician Assistant

## 2019-09-19 ENCOUNTER — Other Ambulatory Visit: Payer: Self-pay

## 2019-09-19 ENCOUNTER — Other Ambulatory Visit
Admission: RE | Admit: 2019-09-19 | Discharge: 2019-09-19 | Disposition: A | Discharge status: Home / Self Care | Payer: PPO | Source: Ambulatory Visit | Attending: Physician Assistant | Admitting: Physician Assistant

## 2019-09-19 ENCOUNTER — Ambulatory Visit (INDEPENDENT_AMBULATORY_CARE_PROVIDER_SITE_OTHER): Payer: PPO | Admitting: Physician Assistant

## 2019-09-19 VITALS — BP 156/80 | HR 73 | Ht 68.0 in | Wt 187.0 lb

## 2019-09-19 DIAGNOSIS — R5383 Other fatigue: Secondary | ICD-10-CM

## 2019-09-19 DIAGNOSIS — E538 Deficiency of other specified B group vitamins: Secondary | ICD-10-CM | POA: Diagnosis not present

## 2019-09-19 DIAGNOSIS — I6930 Unspecified sequelae of cerebral infarction: Secondary | ICD-10-CM | POA: Diagnosis not present

## 2019-09-19 DIAGNOSIS — R251 Tremor, unspecified: Secondary | ICD-10-CM | POA: Diagnosis not present

## 2019-09-19 DIAGNOSIS — I2510 Atherosclerotic heart disease of native coronary artery without angina pectoris: Secondary | ICD-10-CM

## 2019-09-19 DIAGNOSIS — R0602 Shortness of breath: Secondary | ICD-10-CM

## 2019-09-19 DIAGNOSIS — K219 Gastro-esophageal reflux disease without esophagitis: Secondary | ICD-10-CM | POA: Diagnosis not present

## 2019-09-19 DIAGNOSIS — I4891 Unspecified atrial fibrillation: Secondary | ICD-10-CM | POA: Diagnosis not present

## 2019-09-19 DIAGNOSIS — I351 Nonrheumatic aortic (valve) insufficiency: Secondary | ICD-10-CM

## 2019-09-19 DIAGNOSIS — I10 Essential (primary) hypertension: Secondary | ICD-10-CM | POA: Diagnosis not present

## 2019-09-19 LAB — BASIC METABOLIC PANEL
Anion gap: 12 (ref 5–15)
BUN: 16 mg/dL (ref 8–23)
CO2: 29 mmol/L (ref 22–32)
Calcium: 9.1 mg/dL (ref 8.9–10.3)
Chloride: 94 mmol/L — ABNORMAL LOW (ref 98–111)
Creatinine, Ser: 0.97 mg/dL (ref 0.61–1.24)
GFR calc Af Amer: >60 mL/min (ref >60)
GFR calc non Af Amer: >60 mL/min (ref >60)
Glucose, Bld: 315 mg/dL — ABNORMAL HIGH (ref 70–99)
Potassium: 4.2 mmol/L (ref 3.5–5.1)
Sodium: 135 mmol/L (ref 135–145)

## 2019-09-19 LAB — CBC
HCT: 31.8 % — ABNORMAL LOW (ref 39.0–52.0)
Hemoglobin: 10.4 g/dL — ABNORMAL LOW (ref 13.0–17.0)
MCH: 31 pg (ref 26.0–34.0)
MCHC: 32.7 g/dL (ref 30.0–36.0)
MCV: 94.9 fL (ref 80.0–100.0)
Platelets: 236 10*3/uL (ref 150–400)
RBC: 3.35 MIL/uL — ABNORMAL LOW (ref 4.22–5.81)
RDW: 11.9 % (ref 11.5–15.5)
WBC: 6.5 10*3/uL (ref 4.0–10.5)
nRBC: 0 % (ref 0.0–0.2)

## 2019-09-19 NOTE — Patient Instructions (Signed)
Medication Instructions:  Your physician has recommended you make the following change in your medication:  1. INCREASE Furosemide 20 mg to two tablets (40 mg) once daily for 3 days THEN DECREASE Furosemide down to 20 mg once daily  Make sure to elevated legs  Send [place] message on Monday if his breathing and left leg swelling has improved.   *If you need a refill on your cardiac medications before your next appointment, please call your pharmacy*   Lab Work: CBC, BMET today Both of these at the [facility] BMET in One week at the [facility] If you have labs (blood work) drawn today and your tests are completely normal, you will receive your results only by: [name] Kitchen MyChart Message (if you have MyChart) OR . A paper copy in the mail If you have any lab test that is abnormal or we need to change your treatment, we will call you to review the results.   Testing/Procedures: None   Follow-Up: At [facility], you and your health needs are our priority.  As part of our continuing mission to provide you with exceptional heart care, we have created designated Provider Care Teams.  These Care Teams include your primary Cardiologist (physician) and Advanced Practice Providers (APPs -  Physician Assistants and Nurse Practitioners) who all work together to provide you with the care you need, when you need it.  We recommend signing up for the patient portal called "MyChart".  Sign up information is provided on this After Visit Summary.  MyChart is used to connect with patients for Virtual Visits (Telemedicine).  Patients are able to view lab/test results, encounter notes, upcoming appointments, etc.  Non-urgent messages can be sent to your provider as well.   To learn more about what you can do with MyChart, go to [email or web address].    Your next appointment:   2 week(s)  The format for your next appointment:   In Person  Provider:    You may see Ida Rogue,  MD or one of the following Advanced Practice Providers on your designated Care Team:    Murray Hodgkins, NP  Christell Faith, PA-C  Marrianne Mood, PA-C

## 2019-09-19 NOTE — Progress Notes (Signed)
Office Visit    Patient Name: Todd [name] Date of Encounter: 09/19/2019  Primary Care Provider:  Valerie Roys, DO Primary Cardiologist:  Ida Rogue, MD  Chief Complaint    Chief Complaint  Patient presents with  . office visit    Pt having a diffcult time breathing, SOB, Left swells due to Hx of stroke. Meds verbally reviewed w/ pt.   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  Past Medical History    Past Medical History:  Diagnosis Date  . Anxiety   . Aortic regurgitation    a. 03/2017 Echo: Mod AI; b. 05/2017 Echo: Triv AI.  [name] Kitchen Arthritis    osteoarthritis-left knee  . CAD (coronary artery disease)    a. Reported h/o cath ~ 2008 - ? small vessel dzs. No PCI performed; b. 03/2017 MV: EF 45-54%, no ischemia/infarct.  . Depression   . Diabetes mellitus without complication ([facility])   . Diastolic dysfunction    a. 03/2017 Echo: EF 55-65%; b. 05/2017 Echo: EF 55-60%, no rwma, Gr1 DD. Triv AI.  Mildly dil LA.  . ED (erectile dysfunction)   . Elevated troponin    a. chronic mild elevation - 0.03->0.04.  [name] Kitchen GERD (gastroesophageal reflux disease)   . Glaucoma   . Hyperlipidemia   . Hypertension   . Iron deficiency anemia due to chronic blood loss   . Left pontine stroke w/ cerebrovascular disease([facility])    a. 05/2017 MRI/A: acute to subacute L pontine infarct. Chronic left thalamic lacunar infarct. Severe basilar artery stenosis w/ radiographic string sign of the distal 1/3. Mild to moderate L PCA P2 segment stenosis; b. 05/2017 Carotid U/S: <50% bilat ICA stenosis.  . Lumbago    Lumbosacral Neuritis  . Prostate cancer [facility])    Past Surgical History:  Procedure Laterality Date  . CARPAL TUNNEL RELEASE      . REPLACEMENT TOTAL KNEE BILATERAL      Allergies  Allergies  Allergen Reactions  . Amlodipine     Lower extremity swelling at 10 mg.  . Fluoxetine Other (See Comments)    headache  . Meloxicam Other (See Comments)    Other reaction(s): Dizziness Nervousness.     History of Present Illness    Todd [name] is a 86 y.o. male with PMH as above.  He has a history of CVA and CAD by cath approximately 8 to 10 years earlier.  In addition he has a history of aortic valve insufficiency.  He was admitted to the hospital 05/2017 with CVA and MRI brain showed acute to subacute left pontine infarct, as well as chronic left bundle lacunar infarct.  Echo showed EF 55 to 60%, G1 DD, trivial AI, mild LAE.  Carotid artery ultrasound showed 1 to 39% bilateral ICA stenosis.  He is managed on ASA and Plavix per neurology.  At past office visits, most history is obtained from his daughters, as he is mostly aphasic 2/2 stroke.  At previous visits, they have indicated he has been suffering from profound fatigue.  When last seen in the office, it was noted his rate was well controlled in atrial fibrillation/flutter.  Per family request, he was transitioned from Toprol back to extended release propranolol 120 mg daily in an effort to help  his tremor.  Cardioversion was declined.  Eliquis was continued.  It was noted that he had suffered a recent fall with trauma to his left hand.  It was discussed that, if any additional falls, anticoagulation would need to be held for at least 2 days.  Ongoing chronic weakness of his lower extremities was reported with patient in a wheelchair.   Over the last couple of days, he has reported SOB/DOE per both patient and daughter at RTC today. He states that this shortness of breath occurs all the time and is not typical for him. No other reported sx. He denies chest pain, palpitations, pnd, orthopnea, n, v, dizziness, syncope, edema, weight gain, or early satiety. They are trying to eat  healthy. He checks his blood sugar. He reports medication compliance. He reports melena but otherwise no s/sx consistent with bleeding. BP, HR, and weights from home reviewed and listed below:  ---- Home BPs:  144/68, 155/85, 137/76, 129/74 Home HR: 71, 69, 71, 66, 94 Home Wts: 173  Home Medications    Prior to Admission medications   Medication Sig Start Date End Date Taking? Authorizing Provider  acetaminophen (TYLENOL 8 HOUR ARTHRITIS PAIN) 650 MG CR tablet Take 650 mg by mouth every 8 (eight) hours as needed for pain.   Yes [provider]  atorvastatin (LIPITOR) 20 MG tablet Take 1 tablet (20 mg total) by mouth daily. 09/17/19  Yes Johnson, Megan P, DO  blood glucose meter kit and supplies KIT Dispense based on patient and insurance preference. Use up to four times daily as directed. (FOR ICD-9 250.00, 250.01). 11/17/18  Yes Dustin Flock, MD  Blood Glucose Monitoring Suppl (ACCU-CHEK AVIVA PLUS) w/Device KIT Use to check sugar levels x 2 daily 11/17/18  Yes Dustin Flock, MD  carbidopa-levodopa (SINEMET IR) 25-100 MG tablet SMARTSIG:3 Tablet(s) By Mouth Every Other Day PRN 07/08/19  Yes [provider]  digoxin (LANOXIN) 0.125 MG tablet Take 1 tablet (0.125 mg total) by mouth daily. 09/05/19  Yes Johnson, Megan P, DO  docusate sodium (COLACE) 100 MG capsule Take 1 capsule (100 mg total) by mouth 2 (two) times daily as needed for mild constipation. 08/28/19  Yes Johnson, Megan P, DO  DULoxetine (CYMBALTA) 60 MG capsule Take 1 capsule (60 mg total) by mouth daily. 07/01/19  Yes Johnson, Megan P, DO  ELIQUIS 5 MG TABS tablet Take 1 tablet (5 mg total) by mouth 2 (two) times daily. 09/17/19  Yes Johnson, Megan P, DO  furosemide (LASIX) 20 MG tablet Take 1 tablet (20 mg total) by mouth daily. 09/18/19  Yes Johnson, Megan P, DO  gabapentin (NEURONTIN) 100 MG capsule Take 1 capsule (100 mg total) by mouth 2 (two) times daily. Once a day at night 12/12/18  Yes Johnson, Megan P, DO    hydrALAZINE (APRESOLINE) 50 MG tablet Take 1 tablet (50 mg total) by mouth 3 (three) times daily. 03/17/19  Yes Johnson, Megan P, DO  Lancets (ACCU-CHEK SOFT TOUCH) lancets Use as instructed 11/17/18  Yes Dustin Flock, MD  latanoprost (XALATAN) 0.005 % ophthalmic solution Place 1 drop into both eyes at bedtime.  06/08/16  Yes [provider]  Magnesium Oxide 400 (240 Mg) MG TABS Take 1 tablet (400 mg total) by mouth 2 (two) times daily. 07/01/19  Yes Dunn, Areta Haber, PA-C  metFORMIN (GLUCOPHAGE) 500 MG tablet Take 1 tablet (500 mg total) by mouth 2 (two) times daily with a meal. 07/21/19  Yes Johnson, Megan P, DO  naproxen sodium (  ALEVE) 220 MG tablet Take 220 mg by mouth 2 (two) times daily as needed.    Yes [provider]  ONETOUCH ULTRA test strip USE TO CHECK SUGAR LEVELS TWICE DAILY. 11/18/18  Yes Crissman, Jeannette How, MD  pantoprazole (PROTONIX) 40 MG tablet Take 1 tablet (40 mg total) by mouth daily. 09/05/19  Yes Johnson, Megan P, DO  propranolol ER (INDERAL LA) 120 MG 24 hr capsule Take 1 capsule (120 mg total) by mouth daily. 12/30/18  Yes Minna Merritts, MD  tamsulosin (FLOMAX) 0.4 MG CAPS capsule Take 1 capsule (0.4 mg total) by mouth daily. 07/22/19  Yes Johnson, Megan P, DO    Review of Systems    He denies chest pain, palpitations,, pnd, orthopnea, n, v, dizziness, syncope, edema, weight gain, or early satiety. He reports SOB/DOE and melena   All other systems reviewed and are otherwise negative except as noted above.  Physical Exam    VS:  BP (!) 156/80 (BP Location: Left Arm, Patient Position: Sitting, Cuff Size: Normal)   Pulse 73   Ht 5' 8" (1.727 m)   Wt 187 lb (84.8 kg)   SpO2 99%   BMI 28.43 kg/m  , BMI Body mass index is 28.43 kg/m. GEN: Well nourished, well developed, in no acute distress. HEENT: normal. Neck: Supple, no JVD, carotid bruits, or masses. Cardiac: RRR, no murmurs, rubs, or gallops. No clubbing, cyanosis. LLE>RLE edema (chronic).   Radials/DP/PT 2+ and equal bilaterally.  Respiratory:  Respirations regular and unlabored, R basilar crackles. GI: Soft, nontender, nondistended, BS + x 4. MS: no deformity or atrophy. Skin: warm and dry, no rash. Neuro:  Strength and sensation are intact. Psych: Normal affect.  Accessory Clinical Findings    ECG personally reviewed by me today - SR, 1st degree AVB, 73bpm, LAD, previous inferior/anterior infarct - no acute changes.  VITALS Reviewed today   Temp Readings from Last 3 Encounters:  07/21/19 97.6 F (36.4 C)  04/29/19 (!) 97.5 F (36.4 C) (Oral)  03/17/19 (!) 97.3 F (36.3 C) (Oral)   BP Readings from Last 3 Encounters:  07/21/19 (!) 127/58  05/23/19 (!) 162/84  04/29/19 (!) 137/58   Pulse Readings from Last 3 Encounters:  07/21/19 72  05/23/19 72  04/29/19 74    Wt Readings from Last 3 Encounters:  04/29/19 185 lb (83.9 kg)  03/17/19 177 lb (80.3 kg)  12/30/18 190 lb (86.2 kg)     LABS  reviewed today   CareEverwhere Labs present and most recent? Yes/No: No  Lab Results  Component Value Date   WBC 5.8 07/21/2019   HGB 9.7 (L) 07/21/2019   HCT 29.2 (L) 07/21/2019   MCV 92 07/21/2019   PLT 238 07/21/2019   Lab Results  Component Value Date   CREATININE 1.07 07/21/2019   BUN 14 07/21/2019   NA 140 07/21/2019   K 4.1 07/21/2019   CL 97 07/21/2019   CO2 30 (H) 07/21/2019   Lab Results  Component Value Date   ALT 9 07/21/2019   AST 14 07/21/2019   ALKPHOS 82 07/21/2019   BILITOT 0.4 07/21/2019   Lab Results  Component Value Date   CHOL 120 07/21/2019   HDL 34 (L) 07/21/2019   LDLCALC 61 07/21/2019   TRIG 143 07/21/2019   CHOLHDL 3.4 05/18/2017    Lab Results  Component Value Date   HGBA1C 8.2 (H) 07/21/2019   Lab Results  Component Value Date   TSH 1.400 12/12/2018  STUDIES/PROCEDURES reviewed today   Echocardiogram 11/12/2018 1. The left ventricle has mild-moderately reduced systolic function, with  an ejection fraction  of 40-45%. The cavity size was normal. There is  mildly increased left ventricular wall thickness. Left ventricular  diastolic function could not be evaluated.  2. LVEF may be underestimated due to atrial flutter with variable AV  conduction.  3. The right ventricle has moderately reduced systolic function. The  cavity was normal. There is no increase in right ventricular wall  thickness. Right ventricular systolic pressure is moderately elevated with  an estimated pressure of 54.9 mmHg.  4. Left atrial size was mildly dilated.  5. Right atrial size was mildly dilated.  6. The pericardial effusion is posterior to the left ventricle.  7. Trivial pericardial effusion is present.  8. The mitral valve is degenerative. There is mild mitral annular  calcification present.  9. Tricuspid valve regurgitation is mild-moderate.  10. The aortic valve is tricuspid. Mild thickening of the aortic valve.  Moderate calcification of the aortic valve. Aortic valve regurgitation is  trivial by color flow Doppler.  11. There is mild dilatation of the aortic root measuring 38 mm.  12. The inferior vena cava was dilated in size with <50% respiratory  variability.   ZIO monitor 05/2018 Normal sinus rhythm Avg HR of 68 bpm.  1639 atrial tachycardia/Supraventricular Tachycardia runs occurred,  The run with the fastest interval lasting 5 beats with a max rate of 179 bpm,  the longest lasting 13 beats with an avg rate of 104 bpm.   Isolated SVEs were occasional (2.5%, 9549), SVE Couplets were occasional (2.2%, 4164), and SVE Triplets were rare (<1.0%, 613).  Isolated VEs were frequent (8.2%, 31242), VE Couplets were rare (<1.0%, 154), and no VE Triplets were present.  Ventricular Bigeminy and Trigeminy were present.  Assessment & Plan    Atrial fibrillation/flutter --NSR. History of atrial fibrillation versus atrial flutter with variable block. Continue current medications. CHA2DS2VASc score of  at least 8 (CHF, HTN, agex2, DM2, strokex2, vascular) and started on Eliquis at 19m BID. Does not yet meet reduced dosing criteria. Reports compliance with anticoagulation. Given melena, obtain CBC. Rate controlled on today's visit with extended release propanolol.   HFrEF, Pulmonary HTN --Reports SOB and DOE. Echo with EF 40-45%.  Moderately elevated RVSP. Volume up on exam. Will escalate lasix to 236mBID for three days then drop down to previous dose with BMET today and in 1 week.   Hypertension --BP elevated today with history of labile pressures limiting addition of antihypertensives. Suspect that volume overload is contributing to current pressures. Will increase lasix to see if home pressures improve with family agreeable to update [place] volume and BP next week. Continue current dose BB. Reassess BP at RTC.  Frequent PVCs --Denies palpitations. Continue current medications.   Coronary artery disease --No CP. No indication for further ischemic workup at this time. Aggressive risk factor modification with statin. Not on ASA given Eliquis. Continue current medical management.   Lower extremity edema --Slightly volume up on exam with R lung crackles and LEEE. Increase lasix for 3 days to lasix 2083mID then drop down to previous dose of lasix 46m108mily. Check BMET now and in 1 week.   History of stroke --Continue OAC and statin therapy. BP control recommended Reports medication compliance. Given report of melena, check CBC.  Anemia --Will check CBC given report of melena and on anticoagulation.   Medication changes: Increase lasix 3 days then  drop down to previous dose Labs ordered: BMET now and 1 week, CBC Studies / Imaging ordered: None Future considerations: None Disposition: RTC 2 weeks      Arvil Chaco, PA-C 09/19/2019

## 2019-09-22 ENCOUNTER — Telehealth: Payer: Self-pay | Admitting: Cardiovascular Disease

## 2019-09-22 NOTE — Telephone Encounter (Signed)
Needs 2 week fu per checkout 4/16

## 2019-09-23 ENCOUNTER — Telehealth: Payer: Self-pay

## 2019-09-23 DIAGNOSIS — E559 Vitamin D deficiency, unspecified: Secondary | ICD-10-CM | POA: Diagnosis not present

## 2019-09-23 DIAGNOSIS — I2510 Atherosclerotic heart disease of native coronary artery without angina pectoris: Secondary | ICD-10-CM | POA: Diagnosis not present

## 2019-09-23 DIAGNOSIS — E785 Hyperlipidemia, unspecified: Secondary | ICD-10-CM | POA: Diagnosis not present

## 2019-09-23 DIAGNOSIS — G8929 Other chronic pain: Secondary | ICD-10-CM | POA: Diagnosis not present

## 2019-09-23 DIAGNOSIS — E119 Type 2 diabetes mellitus without complications: Secondary | ICD-10-CM | POA: Diagnosis not present

## 2019-09-23 DIAGNOSIS — K219 Gastro-esophageal reflux disease without esophagitis: Secondary | ICD-10-CM | POA: Diagnosis not present

## 2019-09-23 DIAGNOSIS — G4700 Insomnia, unspecified: Secondary | ICD-10-CM | POA: Diagnosis not present

## 2019-09-23 DIAGNOSIS — D869 Sarcoidosis, unspecified: Secondary | ICD-10-CM | POA: Diagnosis not present

## 2019-09-23 NOTE — Telephone Encounter (Signed)
Call to patient to review labs.    Spoke to daughter, Todd [name] who verbalized understanding and has no further questions at this time.    Advised pt to call for any further questions or concerns.  No further orders.

## 2019-09-23 NOTE — Telephone Encounter (Signed)
-----   Message from Arvil Chaco, PA-C sent at 09/22/2019  5:04 PM EDT ----- Please let Todd [name] know that his labs showed stable renal function.  We will recheck his kidneys again in approximately 1 week (please ensure he has a BMET scheduled).  Does he feel better with the increased diuresis over the weekend?    His hemoglobin is stable from previous labs as well.   His glucose/sugars were elevated at 315, which could also be contributing to his symptoms. We will need to make sure he is checking his sugars.

## 2019-09-26 ENCOUNTER — Other Ambulatory Visit
Admission: RE | Admit: 2019-09-26 | Discharge: 2019-09-26 | Disposition: A | Discharge status: Home / Self Care | Payer: PPO | Source: Ambulatory Visit | Attending: Physician Assistant | Admitting: Physician Assistant

## 2019-09-26 DIAGNOSIS — I4891 Unspecified atrial fibrillation: Secondary | ICD-10-CM | POA: Insufficient documentation

## 2019-09-26 DIAGNOSIS — I2510 Atherosclerotic heart disease of native coronary artery without angina pectoris: Secondary | ICD-10-CM | POA: Diagnosis not present

## 2019-09-26 LAB — BASIC METABOLIC PANEL
Anion gap: 11 (ref 5–15)
BUN: 16 mg/dL (ref 8–23)
CO2: 32 mmol/L (ref 22–32)
Calcium: 9.3 mg/dL (ref 8.9–10.3)
Chloride: 95 mmol/L — ABNORMAL LOW (ref 98–111)
Creatinine, Ser: 1.02 mg/dL (ref 0.61–1.24)
GFR calc Af Amer: >60 mL/min (ref >60)
GFR calc non Af Amer: >60 mL/min (ref >60)
Glucose, Bld: 278 mg/dL — ABNORMAL HIGH (ref 70–99)
Potassium: 4.3 mmol/L (ref 3.5–5.1)
Sodium: 138 mmol/L (ref 135–145)

## 2019-10-01 DIAGNOSIS — E1140 Type 2 diabetes mellitus with diabetic neuropathy, unspecified: Secondary | ICD-10-CM | POA: Diagnosis not present

## 2019-10-01 DIAGNOSIS — B351 Tinea unguium: Secondary | ICD-10-CM | POA: Diagnosis not present

## 2019-10-05 NOTE — Progress Notes (Signed)
Office Visit    Patient Name: Todd [name] Date of Encounter: 10/06/2019  Primary Care Provider:  Valerie Roys, DO Primary Cardiologist:  Ida Rogue, MD  Chief Complaint    Chief Complaint  Patient presents with  . office visit    Pt concern w/ breathing. Meds verbally reviewed w/ pt.   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  Past Medical History    Past Medical History:  Diagnosis Date  . Anxiety   . Aortic regurgitation    a. 03/2017 Echo: Mod AI; b. 05/2017 Echo: Triv AI.  [name] Kitchen Arthritis    osteoarthritis-left knee  . CAD (coronary artery disease)    a. Reported h/o cath ~ 2008 - ? small vessel dzs. No PCI performed; b. 03/2017 MV: EF 45-54%, no ischemia/infarct.  . Depression   . Diabetes mellitus without complication ([place])   . Diastolic dysfunction    a. 03/2017 Echo: EF 55-65%; b. 05/2017 Echo: EF 55-60%, no rwma, Gr1 DD. Triv AI.  Mildly dil LA.  . ED (erectile dysfunction)   . Elevated troponin    a. chronic mild elevation - 0.03->0.04.  [name] Kitchen GERD (gastroesophageal reflux disease)   . Glaucoma   . Hyperlipidemia   . Hypertension   . Iron deficiency anemia due to chronic blood loss   . Left pontine stroke w/ cerebrovascular disease(HCC)    a. 05/2017 MRI/A: acute to subacute L pontine infarct. Chronic left thalamic lacunar infarct. Severe basilar artery stenosis w/ radiographic string sign of the distal 1/3. Mild to moderate L PCA P2 segment stenosis; b. 05/2017 Carotid U/S: <50% bilat ICA stenosis.  . Lumbago    Lumbosacral Neuritis  . Prostate cancer [facility])    Past Surgical History:  Procedure Laterality Date  . CARPAL TUNNEL RELEASE    .  REPLACEMENT TOTAL KNEE BILATERAL      Allergies  Allergies  Allergen Reactions  . Amlodipine     Lower extremity swelling at 10 mg.  . Fluoxetine Other (See Comments)    headache  . Meloxicam Other (See Comments)    Other reaction(s): Dizziness Nervousness.     History of Present Illness    Todd [name] is a 86 y.o. male with PMH as above.  He has a history of CVA and CAD by cath approximately 8 to 10 years earlier.  In addition he has a history of aortic valve insufficiency.  He was admitted to the hospital 05/2017 with CVA and MRI brain showed acute to subacute left pontine infarct, as well as chronic left bundle lacunar infarct.  Echo showed EF 55 to 60%, G1 DD, trivial AI, mild LAE.  Carotid artery ultrasound showed 1 to 39% bilateral ICA stenosis.  He is managed on ASA and Plavix per neurology.  At past office visits, most history is obtained from his daughters, as he is mostly aphasic 2/2 stroke.  At previous visits, they had indicated he had been suffering from profound fatigue.  When last seen in the office, it was noted his rate was well controlled in atrial fibrillation/flutter.  Per family request, he was transitioned from Toprol back to extended release propranolol 120 mg daily in an effort to help his tremor.  Cardioversion was declined.  Eliquis was continued.  It was noted that he had suffered a recent fall with trauma to his left hand.  It was discussed that, if any additional falls, anticoagulation would need to be held for at least 2 days.  Ongoing chronic weakness of his lower extremities was reported with patient in a wheelchair.   When last seen at clinic, SOB/DOE was noted per both patient and daughter. The sx occurred all the time and was not typical for him. He was checking his blood sugar. He reported melena but otherwise no s/sx consistent with bleeding. BP, HR, and weights from home reviewed and 144/68, 155/85, 137/76, 129/74. Home HR: 71, 69, 71, 66, 94bpm. Home Wt:  173lbs. Lasix was increased from 31m daily to 269mBID for three days then dropped down to baseline 2042maily dose. BMET checked same day and in 1 week following increase with stable renal function and electrolytes noted.   He returns to clinic today and is joined again by his daughter. He notes frequent and increased urination with urine yellow and clear. No recent reported CP, racing HR, or palpitations per patient or family. No presyncope, syncope, or recnent falls. He notes unchanged SOB though improved breathing status reported today by family. He notes improvement in his LEE with previous LEE greater on left than right and consistent with today's exam. No recent progressive or additional s/sx suggestive of acute worsening HF as reported by daughter or patient. No reported orthopnea, abdominal distention, PND, or early satiety. Most recent BP readings show continued elevated BP with SBP 150s-170s and DBPs 80s. It is clarified that BP is taken in the AM and prior to receiving his BP medication, which may explain the discrepancy with today's clinic BP of 120/62 and improved from previous clinic BP.   Of note, home BP did not show much change with BP 143/71  141/77 on 4/19. It was clarified BP is taken before lasix / medications for the day. Weight on 4/12 was 173 lbs and decreased to 171lbs following increased diuresis. Clinic BP (from 4/16 to 5/3) improved from 156/80  120/62 (recheck BP as initial reported as 140/64). Lowest SBP includes 1 BP reading with SBP in the 120s and dehydration noted that day. Clinic wt improved from 187lbs  174.6lbs but unclear if accurate. HR 73bpm  66bpm today. SpO2 99%  98% on [place] oxygen.   Home Medications    Prior to Admission medications   Medication Sig Start Date End Date Taking? Authorizing Provider  acetaminophen (TYLENOL 8 HOUR ARTHRITIS PAIN) 650 MG CR tablet Take 650 mg by mouth every 8 (eight) hours as needed for pain.   Yes [provider]    atorvastatin (LIPITOR) 20 MG tablet Take 1 tablet (20 mg total) by mouth daily. 09/17/19  Yes Johnson, Megan P, DO  blood glucose meter kit and supplies KIT Dispense based on patient and insurance preference. Use up to four times daily as directed. (FOR ICD-9 250.00, 250.01). 11/17/18  Yes PatDustin FlockD  Blood Glucose Monitoring Suppl (ACCU-CHEK AVIVA PLUS) w/Device KIT Use to check sugar levels x 2 daily 11/17/18  Yes PatDustin FlockD  carbidopa-levodopa (SINEMET IR) 25-100 MG tablet SMARTSIG:3 Tablet(s) By Mouth Every Other Day PRN 07/08/19  Yes [provider]  digoxin (LANOXIN) 0.125 MG tablet Take 1 tablet (0.125 mg total) by mouth daily. 09/05/19  Yes Johnson, Megan P, DO  docusate sodium (COLACE) 100 MG capsule Take 1 capsule (100 mg total)  by mouth 2 (two) times daily as needed for mild constipation. 08/28/19  Yes Johnson, Megan P, DO  DULoxetine (CYMBALTA) 60 MG capsule Take 1 capsule (60 mg total) by mouth daily. 07/01/19  Yes Johnson, Megan P, DO  ELIQUIS 5 MG TABS tablet Take 1 tablet (5 mg total) by mouth 2 (two) times daily. 09/17/19  Yes Johnson, Megan P, DO  furosemide (LASIX) 20 MG tablet Take 1 tablet (20 mg total) by mouth daily. 09/18/19  Yes Johnson, Megan P, DO  gabapentin (NEURONTIN) 100 MG capsule Take 1 capsule (100 mg total) by mouth 2 (two) times daily. Once a day at night 12/12/18  Yes Johnson, Megan P, DO  hydrALAZINE (APRESOLINE) 50 MG tablet Take 1 tablet (50 mg total) by mouth 3 (three) times daily. 03/17/19  Yes Johnson, Megan P, DO  Lancets (ACCU-CHEK SOFT TOUCH) lancets Use as instructed 11/17/18  Yes Dustin Flock, MD  latanoprost (XALATAN) 0.005 % ophthalmic solution Place 1 drop into both eyes at bedtime.  06/08/16  Yes [provider]  Magnesium Oxide 400 (240 Mg) MG TABS Take 1 tablet (400 mg total) by mouth 2 (two) times daily. 07/01/19  Yes Dunn, Areta Haber, PA-C  metFORMIN (GLUCOPHAGE) 500 MG tablet Take 1 tablet (500 mg total) by mouth 2 (two) times  daily with a meal. 07/21/19  Yes Johnson, Megan P, DO  naproxen sodium (ALEVE) 220 MG tablet Take 220 mg by mouth 2 (two) times daily as needed.    Yes [provider]  ONETOUCH ULTRA test strip USE TO CHECK SUGAR LEVELS TWICE DAILY. 11/18/18  Yes Crissman, Jeannette How, MD  pantoprazole (PROTONIX) 40 MG tablet Take 1 tablet (40 mg total) by mouth daily. 09/05/19  Yes Johnson, Megan P, DO  propranolol ER (INDERAL LA) 120 MG 24 hr capsule Take 1 capsule (120 mg total) by mouth daily. 12/30/18  Yes Minna Merritts, MD  tamsulosin (FLOMAX) 0.4 MG CAPS capsule Take 1 capsule (0.4 mg total) by mouth daily. 07/22/19  Yes Johnson, Megan P, DO    Review of Systems    He denies chest pain, palpitations, dyspnea, pnd, orthopnea, n, v, dizziness, syncope, edema, weight gain, or early satiety. He reports ongoing SOB/DOE unchanged from previous visit with family reporting improved breathing. He reports frequent urination but denies hematuria or dark urine. He reports ongoing though improved LEE with LLEE>RLEE (chronic and as reported in past visit). All other systems reviewed and are otherwise negative except as noted above.  Physical Exam    VS:  BP 120/62 (BP Location: Left Arm, Patient Position: Sitting, Cuff Size: Normal)   Pulse 66   Ht '5\' 8"'$  (1.727 m)   Wt 174 lb 6 oz (79.1 kg)   SpO2 98%   BMI 26.51 kg/m  , BMI Body mass index is 26.51 kg/m. GEN: Elderly and frail male, in no acute distress, mask in place, seated in wheelchair, joined by daughter. HEENT: normal. Neck: Supple, no JVD, carotid bruits, or masses. Cardiac: RRR, no murmurs, rubs, or gallops. No clubbing, cyanosis. Chronic but improved non-pitting / mild LLE>RLE edema (chronic), consistent with dependent edema.  Radials/DP/PT 2+ and equal bilaterally.  Respiratory:  Respirations regular with poor inspiratory effort. Resolution of previous R sided crackles; however, in its place, new reduced RLL breath sounds / absent RLL breath sounds.    GI: Soft, nontender, nondistended, BS + x 4. MS: no deformity or atrophy. Skin: warm and dry, no rash. Neuro:  Strength and sensation are intact.  Psych: Normal affect.  Accessory Clinical Findings    ECG personally reviewed by me today -sinus rhythm, 66 bpm, first-degree AV block, QRS 94 ms, left axis deviation, previously noted inferior and anterior infarct, no acute changes from previous  VITALS Reviewed today   Temp Readings from Last 3 Encounters:  07/21/19 97.6 F (36.4 C)  04/29/19 (!) 97.5 F (36.4 C) (Oral)  03/17/19 (!) 97.3 F (36.3 C) (Oral)   BP Readings from Last 3 Encounters:  10/06/19 120/62  09/19/19 (!) 156/80  07/21/19 (!) 127/58   Pulse Readings from Last 3 Encounters:  10/06/19 66  09/19/19 73  07/21/19 72    Wt Readings from Last 3 Encounters:  10/06/19 174 lb 6 oz (79.1 kg)  09/19/19 187 lb (84.8 kg)  04/29/19 185 lb (83.9 kg)     LABS  reviewed today   CareEverwhere Labs present and most recent? Yes/No: No  Lab Results  Component Value Date   WBC 6.5 09/19/2019   HGB 10.4 (L) 09/19/2019   HCT 31.8 (L) 09/19/2019   MCV 94.9 09/19/2019   PLT 236 09/19/2019   Lab Results  Component Value Date   CREATININE 1.02 09/26/2019   BUN 16 09/26/2019   NA 138 09/26/2019   K 4.3 09/26/2019   CL 95 (L) 09/26/2019   CO2 32 09/26/2019   Lab Results  Component Value Date   ALT 9 07/21/2019   AST 14 07/21/2019   ALKPHOS 82 07/21/2019   BILITOT 0.4 07/21/2019   Lab Results  Component Value Date   CHOL 120 07/21/2019   HDL 34 (L) 07/21/2019   LDLCALC 61 07/21/2019   TRIG 143 07/21/2019   CHOLHDL 3.4 05/18/2017    Lab Results  Component Value Date   HGBA1C 8.2 (H) 07/21/2019   Lab Results  Component Value Date   TSH 1.400 12/12/2018     STUDIES/PROCEDURES reviewed today   Echocardiogram 11/12/2018 1. The left ventricle has mild-moderately reduced systolic function, with  an ejection fraction of 40-45%. The cavity size was normal.  There is  mildly increased left ventricular wall thickness. Left ventricular  diastolic function could not be evaluated.  2. LVEF may be underestimated due to atrial flutter with variable AV  conduction.  3. The right ventricle has moderately reduced systolic function. The  cavity was normal. There is no increase in right ventricular wall  thickness. Right ventricular systolic pressure is moderately elevated with  an estimated pressure of 54.9 mmHg.  4. Left atrial size was mildly dilated.  5. Right atrial size was mildly dilated.  6. The pericardial effusion is posterior to the left ventricle.  7. Trivial pericardial effusion is present.  8. The mitral valve is degenerative. There is mild mitral annular  calcification present.  9. Tricuspid valve regurgitation is mild-moderate.  10. The aortic valve is tricuspid. Mild thickening of the aortic valve.  Moderate calcification of the aortic valve. Aortic valve regurgitation is  trivial by color flow Doppler.  11. There is mild dilatation of the aortic root measuring 38 mm.  12. The inferior vena cava was dilated in size with <50% respiratory  variability.   ZIO monitor 05/2018 Normal sinus rhythm Avg HR of 68 bpm.  1639 atrial tachycardia/Supraventricular Tachycardia runs occurred,  The run with the fastest interval lasting 5 beats with a max rate of 179 bpm,  the longest lasting 13 beats with an avg rate of 104 bpm.   Isolated SVEs were occasional (2.5%, 9549), SVE Couplets  were occasional (2.2%, 4164), and SVE Triplets were rare (<1.0%, 613).  Isolated VEs were frequent (8.2%, 31242), VE Couplets were rare (<1.0%, 154), and no VE Triplets were present.  Ventricular Bigeminy and Trigeminy were present.  Assessment & Plan    SOB, chronic  --Ongoing SOB per patient with family reporting improved breathing status s/p increased diuresis. 09/19/19 to 10/06/19 vitals improved s/p increased diuresis x3 days with lasix 5m BID  then down to usual 222mdaily. Given ongoing sx and previous echo above, as well as exam today and stable BMET, increase lasix to 3086maily going forward (taken in AM to prevent interruption in sleep). Repeat BMET in 1 week, and if stable, no further labs or mediation changes at that time. Ordered repeat echo as ~ 1 year from previous. Consider ongoing SOB given comorbid conditions. Continue oxygen.   Atrial fibrillation/flutter --NSR. History of atrial fibrillation versus atrial flutter with variable block.  --Rate controlled on today's visit with extended release propanolol. Continue.  --CHA2DS2VASc score of at least8 (CHF, HTN, agex2, DM2, strokex2, vascular). ContinueEliquis at 5mg53mD. Does not yet meet reduced dosing criteria (age only). Continue to monitor renal function and weight, and if either wt/Cr meets criteria for reduced dosing, reduce dose at that time. Reports compliance with anticoagulation. Previous melena reported at last visit with subsequent CBC Hgb / counts stable. No recent melena or sx of GIB reported today.  --Continue digoxin. No recent digoxin level on review of EMR with most recent Cr 1.02. Will plan for updated digoxin level at time of updated BMET in ~ 1 week.   Chronic HFrEF, Pulmonary HTN --Pt reports unchanged SOB and DOE. Family reports improved breathing status. Echo 11/2018 with EF 40-45%.  Moderately elevated RVSP. Improved vitals and volume status s/p lasix 20mg58m x3d and BMET stable. Will escalate lasix to 30mg 3moing forward given still mildly volume up on exam and recheck BMET to ensure stable Cr / BUN / K in 1 week. If stable labs at that time, no further medication changes or repeat labs needed until next follow-up or unless new sx reported. Repeat echo with further recommendations if needed.   Hypertension --BP improved (on recheck) with increased 3 days of diuresis. Increase lasix to 30mg d27m going forward (lasix taken in the AM to reduce staying up  2/2 urination). Recheck BMET in  1 week, and if stable / improved, no further labs or medication changes needed until RTC or unless new sx.  Notify the office if reduced urination or s/sx concerning for dehydration of presyncope. Continue current dose BB.  Frequent PVCs --Denies palpitations. Continue current medications, including his BB.   Coronary artery disease --No CP. No indication for further ischemic workup at this time. Aggressive risk factor modification with statin. Not on ASA given Eliquis. Continue medical management.   Lower extremity edema --Improved but ongoing LEE with LLE edema >RLE edema. Consider also some element of dependent edema. No new s/sx concerning for DVT. Increase daily lasix to 30mg ea79mM as above and given recent response to lasix 20mg BID48m three days with ongoing reported sx and still slight volume overload on exam. Recheck BMET in  1 week s/p new dose of lasix 30mg dail53mnd if stable renal function and electrolytes at that time, no further labs or medication changes. Consider compression stockings for dependent edema moving forward.   History of stroke --Continue OAC and statin therapy. BP control recommended. Reports medication compliance.   Anemia --  Most recent CBC with stable Hgb. No further melena reported.   Hyperglycemia, DM2 -DM2 not well controlled. Continue DM2 medications/metformin as per PCP. Strict glycemic control recommended. Most recent glucose values elevated. Previous A1C 11/2018 9.2.   HLD goal LDL <70 --Continue current statin. Most recent LDL below 70 at 61 on 07/21/19 with total cholesterol 120, TG 143, and HDL 34.  Medication changes: Increase lasix to 57m each AM Labs ordered: BMET in 1 week, CBC Studies / Imaging ordered None Future considerations: Update echo Disposition: RTC  s/p echo   JArvil Chaco PA-C 10/06/2019

## 2019-10-06 ENCOUNTER — Other Ambulatory Visit: Payer: Self-pay

## 2019-10-06 ENCOUNTER — Encounter: Payer: Self-pay | Admitting: Physician Assistant

## 2019-10-06 ENCOUNTER — Ambulatory Visit (INDEPENDENT_AMBULATORY_CARE_PROVIDER_SITE_OTHER): Payer: PPO | Admitting: Physician Assistant

## 2019-10-06 VITALS — BP 120/62 | HR 66 | Ht 68.0 in | Wt 174.4 lb

## 2019-10-06 DIAGNOSIS — I10 Essential (primary) hypertension: Secondary | ICD-10-CM | POA: Diagnosis not present

## 2019-10-06 DIAGNOSIS — E119 Type 2 diabetes mellitus without complications: Secondary | ICD-10-CM | POA: Diagnosis not present

## 2019-10-06 DIAGNOSIS — I6930 Unspecified sequelae of cerebral infarction: Secondary | ICD-10-CM

## 2019-10-06 DIAGNOSIS — I69359 Hemiplegia and hemiparesis following cerebral infarction affecting unspecified side: Secondary | ICD-10-CM | POA: Diagnosis not present

## 2019-10-06 DIAGNOSIS — I4891 Unspecified atrial fibrillation: Secondary | ICD-10-CM

## 2019-10-06 DIAGNOSIS — I5020 Unspecified systolic (congestive) heart failure: Secondary | ICD-10-CM | POA: Diagnosis not present

## 2019-10-06 DIAGNOSIS — K219 Gastro-esophageal reflux disease without esophagitis: Secondary | ICD-10-CM

## 2019-10-06 DIAGNOSIS — I351 Nonrheumatic aortic (valve) insufficiency: Secondary | ICD-10-CM

## 2019-10-06 DIAGNOSIS — R0602 Shortness of breath: Secondary | ICD-10-CM

## 2019-10-06 DIAGNOSIS — Z79899 Other long term (current) drug therapy: Secondary | ICD-10-CM | POA: Diagnosis not present

## 2019-10-06 DIAGNOSIS — R4701 Aphasia: Secondary | ICD-10-CM

## 2019-10-06 DIAGNOSIS — I2510 Atherosclerotic heart disease of native coronary artery without angina pectoris: Secondary | ICD-10-CM

## 2019-10-06 MED ORDER — FUROSEMIDE 20 MG PO TABS: ORAL_TABLET | ORAL | 3 refills | Status: DC

## 2019-10-06 NOTE — Patient Instructions (Addendum)
Medication Instructions:  - Your physician has recommended you make the following change in your medication:   1) Increase lasix (furosemide) 20 mg- take 1.5 tablets (30 mg) by mouth once daily  *If you need a refill on your cardiac medications before your next appointment, please call your pharmacy*   Lab Work: - Your physician recommends that you return for lab work in: 1 week (around 10/13/19)- BMP/ Digoxin  -Come to the [place] entrance at [facility], 1st desk on the right to check in (past the screening table). - No appointment needed: Lab hours= Monday- Friday (7:30 am- 5:30 pm)  If you have labs (blood work) drawn today and your tests are completely normal, you will receive your results only by: [name] Kitchen MyChart Message (if you have MyChart) OR . A paper copy in the mail If you have any lab test that is abnormal or we need to change your treatment, we will call you to review the results.   Testing/Procedures: - Your physician has requested that you have an echocardiogram. Echocardiography is a painless test that uses sound waves to create images of your heart. It provides your doctor with information about the size and shape of your heart and how well your heart's chambers and valves are working. This procedure takes approximately one hour. There are no restrictions for this procedure.Please hydrate prior to coming to you appointment as we occasionally have to start an IV on some patients during the echo to inject an image enhancing agent. We do not know if we will need to do this until your initial pictures are taken.  Follow-Up: At [facility], you and your health needs are our priority.  As part of our continuing mission to provide you with exceptional heart care, we have created designated Provider Care Teams.  These Care Teams include your primary Cardiologist (physician) and Advanced Practice Providers (APPs -  Physician Assistants and Nurse Practitioners) who all work together to  provide you with the care you need, when you need it.  We recommend signing up for the patient portal called "MyChart".  Sign up information is provided on this After Visit Summary.  MyChart is used to connect with patients for Virtual Visits (Telemedicine).  Patients are able to view lab/test results, encounter notes, upcoming appointments, etc.  Non-urgent messages can be sent to your provider as well.   To learn more about what you can do with MyChart, go to [email or web address].    Your next appointment:   After your echo is completed   The format for your next appointment:   In Person  Provider:    You may see Ida Rogue, MD or one of the following Advanced Practice Providers on your designated Care Team:    Murray Hodgkins, NP  Christell Faith, PA-C  Marrianne Mood, PA-C    Other Instructions - n/a   Echocardiogram An echocardiogram is a procedure that uses painless sound waves (ultrasound) to produce an image of the heart. Images from an echocardiogram can provide important information about:  Signs of coronary artery disease (CAD).  Aneurysm detection. An aneurysm is a weak or damaged part of an artery wall that bulges out from the normal force of blood pumping through the body.  Heart size and shape. Changes in the size or shape of the heart can be associated with certain conditions, including heart failure, aneurysm, and CAD.  Heart muscle function.  Heart valve function.  Signs of a past heart attack.  Fluid buildup around  the heart.  Thickening of the heart muscle.  A tumor or infectious growth around the heart valves. Tell a health care provider about:  Any allergies you have.  All medicines you are taking, including vitamins, herbs, eye drops, creams, and over-the-counter medicines.  Any blood disorders you have.  Any surgeries you have had.  Any medical conditions you have.  Whether you are pregnant or may be pregnant. What are the  risks? Generally, this is a safe procedure. However, problems may occur, including:  Allergic reaction to dye (contrast) that may be used during the procedure. What happens before the procedure? No specific preparation is needed. You may eat and drink normally. What happens during the procedure?   An IV tube may be inserted into one of your veins.  You may receive contrast through this tube. A contrast is an injection that improves the quality of the pictures from your heart.  A gel will be applied to your chest.  A wand-like tool (transducer) will be moved over your chest. The gel will help to transmit the sound waves from the transducer.  The sound waves will harmlessly bounce off of your heart to allow the heart images to be captured in real-time motion. The images will be recorded on a computer. The procedure may vary among health care providers and hospitals. What happens after the procedure?  You may return to your normal, everyday life, including diet, activities, and medicines, unless your health care provider tells you not to do that. Summary  An echocardiogram is a procedure that uses painless sound waves (ultrasound) to produce an image of the heart.  Images from an echocardiogram can provide important information about the size and shape of your heart, heart muscle function, heart valve function, and fluid buildup around your heart.  You do not need to do anything to prepare before this procedure. You may eat and drink normally.  After the echocardiogram is completed, you may return to your normal, everyday life, unless your health care provider tells you not to do that. This information is not intended to replace advice given to you by your health care provider. Make sure you discuss any questions you have with your health care provider. Document Revised: 09/12/2018 Document Reviewed: 06/24/2016 Elsevier Patient Education  [place].

## 2019-10-10 DIAGNOSIS — I2510 Atherosclerotic heart disease of native coronary artery without angina pectoris: Secondary | ICD-10-CM | POA: Diagnosis not present

## 2019-10-10 DIAGNOSIS — R0902 Hypoxemia: Secondary | ICD-10-CM | POA: Diagnosis not present

## 2019-10-10 DIAGNOSIS — I501 Left ventricular failure: Secondary | ICD-10-CM | POA: Diagnosis not present

## 2019-10-13 ENCOUNTER — Other Ambulatory Visit: Payer: Self-pay | Admitting: Family Medicine

## 2019-10-16 ENCOUNTER — Ambulatory Visit (INDEPENDENT_AMBULATORY_CARE_PROVIDER_SITE_OTHER): Payer: PPO

## 2019-10-16 VITALS — Ht 68.0 in | Wt 174.0 lb

## 2019-10-16 DIAGNOSIS — R4701 Aphasia: Secondary | ICD-10-CM | POA: Diagnosis not present

## 2019-10-16 DIAGNOSIS — Z0000 Encounter for general adult medical examination without abnormal findings: Secondary | ICD-10-CM

## 2019-10-16 DIAGNOSIS — E1142 Type 2 diabetes mellitus with diabetic polyneuropathy: Secondary | ICD-10-CM

## 2019-10-16 DIAGNOSIS — I69359 Hemiplegia and hemiparesis following cerebral infarction affecting unspecified side: Secondary | ICD-10-CM | POA: Diagnosis not present

## 2019-10-16 NOTE — Progress Notes (Signed)
Subjective:   Todd [name] is a 86 y.o. male who presents for Medicare Annual/Subsequent preventive examination.  I connected with Todd [name] today by telephone and verified that I am speaking with the correct person using two identifiers. Location patient: home Location provider: work Persons participating in the virtual visit: patient, patients daughters- Todd [name] and Todd [name]  And provider.   I discussed the limitations, risks, security and privacy concerns of performing an evaluation and management service by telephone and the availability of in person appointments. I also discussed with the patient that there may be a patient responsible charge related to this service. The patient expressed understanding and verbally consented to this telephonic visit.    Interactive audio and video telecommunications were attempted between this provider and patient, however failed, due to patient having technical difficulties OR patient did not have access to video capability.  We continued and completed visit with audio only.  Some vital signs may be absent or patient reported.   Time Spent with patient on telephone encounter: 45 minutes  Review of Systems:   Cardiac Risk Factors include: advanced age (>40mn, >>4women);diabetes mellitus;dyslipidemia;hypertension;male gender     Objective:    Vitals: Ht _0  (1.727 m)   Wt 174 lb (78.9 kg)   BMI 26.46 kg/m   Body mass index is 26.46 kg/m.  Advanced Directives 10/16/2019 04/29/2019 11/11/2018 11/11/2018 11/11/2018 10/08/2018 09/03/2018  Does Patient Have a Medical Advance Directive? No No No No Unable to assess, patient is non-responsive or altered mental status No No  Type of Advance Directive - - - - - - -  Copy of Healthcare Power of Attorney in Chart? - - - - - - -  Would patient like information on creating a medical advance directive? - - No - Patient declined - - No - Patient declined No - Patient declined    Tobacco Social History    Tobacco Use  Smoking Status Former Smoker  . Quit date: 12/02/1994  . Years since quitting: 24.8  Smokeless Tobacco Never Used     Counseling given: Not Answered   Clinical Intake:  Pre-visit preparation completed: Yes  Pain : No/denies pain     Nutritional Risks: None  How often do you need to have someone help you when you read instructions, pamphlets, or other written materials from your doctor or pharmacy?: 1 - Never  Interpreter Needed?: No  Information entered by :: Charolett Yarrow,LPN  Past Medical History:  Diagnosis Date  . Anxiety   . Aortic regurgitation    a. 03/2017 Echo: Mod AI; b. 05/2017 Echo: Triv AI.  .[name] KitchenArthritis    osteoarthritis-left knee  . CAD (coronary artery disease)    a. Reported h/o cath ~ 2008 - ? small vessel dzs. No PCI performed; b. 03/2017 MV: EF 45-54%, no ischemia/infarct.  . Depression   . Diabetes mellitus without complication ([place]   . Diastolic dysfunction    a. 03/2017 Echo: EF 55-65%; b. 05/2017 Echo: EF 55-60%, no rwma, Gr1 DD. Triv AI.  Mildly dil LA.  . ED (erectile dysfunction)   . Elevated troponin    a. chronic mild elevation - 0.03->0.04.  .[name] KitchenGERD (gastroesophageal reflux disease)   . Glaucoma   . Hyperlipidemia   . Hypertension   . Iron deficiency anemia due to chronic blood loss   . Left pontine stroke w/ cerebrovascular disease(HCC)    a. 05/2017 MRI/A: acute to subacute L pontine infarct. Chronic left thalamic lacunar infarct. Severe basilar  artery stenosis w/ radiographic string sign of the distal 1/3. Mild to moderate L PCA P2 segment stenosis; b. 05/2017 Carotid U/S: <50% bilat ICA stenosis.  . Lumbago    Lumbosacral Neuritis  . Prostate cancer [facility])    Past Surgical History:  Procedure Laterality Date  . CARPAL TUNNEL RELEASE    . REPLACEMENT TOTAL KNEE BILATERAL     Family History  Problem Relation Age of Onset  . Stroke Mother   . Hypertension Mother   . Hypertension Father   . Stroke Sister   .  Prostate cancer Neg Hx   . Kidney cancer Neg Hx   . Bladder Cancer Neg Hx    Social History   Socioeconomic History  . Marital status: Widowed    Spouse name: Not on file  . Number of children: Not on file  . Years of education: Not on file  . Highest education level: Not on file  Occupational History  . Occupation: retired  Tobacco Use  . Smoking status: Former Smoker    Quit date: 12/02/1994    Years since quitting: 24.8  . Smokeless tobacco: Never Used  Substance and Sexual Activity  . Alcohol use: Yes    Alcohol/week: 1.0 standard drinks    Types: 1 Cans of beer per week    Comment: occasional  . Drug use: No  . Sexual activity: Never  Other Topics Concern  . Not on file  Social History Narrative   Lives at home with family.  Able to ambulate with a walker.   Social Determinants of Health   Financial Resource Strain:   . Difficulty of Paying Living Expenses:   Food Insecurity:   . Worried About Charity fundraiser in the Last Year:   . Arboriculturist in the Last Year:   Transportation Needs:   . Film/video editor (Medical):   [name] Kitchen Lack of Transportation (Non-Medical):   Physical Activity:   . Days of Exercise per Week:   . Minutes of Exercise per Session:   Stress:   . Feeling of Stress :   Social Connections:   . Frequency of Communication with Friends and Family:   . Frequency of Social Gatherings with Friends and Family:   . Attends Religious Services:   . Active Member of Clubs or Organizations:   . Attends Archivist Meetings:   [name] Kitchen Marital Status:     Outpatient Encounter Medications as of 10/16/2019  Medication Sig  . acetaminophen (TYLENOL 8 HOUR ARTHRITIS PAIN) 650 MG CR tablet Take 650 mg by mouth every 8 (eight) hours as needed for pain.  [name] Kitchen atorvastatin (LIPITOR) 20 MG tablet Take 1 tablet (20 mg total) by mouth daily.  . blood glucose meter kit and supplies KIT Dispense based on patient and insurance preference. Use up to four times  daily as directed. (FOR ICD-9 250.00, 250.01).  . Blood Glucose Monitoring Suppl (ACCU-CHEK AVIVA PLUS) w/Device KIT Use to check sugar levels x 2 daily  . carbidopa-levodopa (SINEMET IR) 25-100 MG tablet SMARTSIG:3 Tablet(s) By Mouth Every Other Day PRN  . digoxin (LANOXIN) 0.125 MG tablet Take 1 tablet (0.125 mg total) by mouth daily.  [name] Kitchen docusate sodium (COLACE) 100 MG capsule Take 1 capsule (100 mg total) by mouth 2 (two) times daily as needed for mild constipation.  . DULoxetine (CYMBALTA) 60 MG capsule Take 1 capsule (60 mg total) by mouth daily.  [name] Kitchen ELIQUIS 5 MG TABS tablet Take 1 tablet (5 mg  total) by mouth 2 (two) times daily.  . furosemide (LASIX) 20 MG tablet Take 1.5 tablets (30 mg) by mouth once daily  . gabapentin (NEURONTIN) 100 MG capsule Take 1 capsule (100 mg total) by mouth 2 (two) times daily. Once a day at night  . hydrALAZINE (APRESOLINE) 50 MG tablet Take 1 tablet (50 mg total) by mouth 3 (three) times daily.  . Lancets (ACCU-CHEK SOFT TOUCH) lancets Use as instructed  . latanoprost (XALATAN) 0.005 % ophthalmic solution Place 1 drop into both eyes at bedtime.   . Magnesium Oxide 400 (240 Mg) MG TABS Take 1 tablet (400 mg total) by mouth 2 (two) times daily.  . metFORMIN (GLUCOPHAGE) 500 MG tablet Take 1 tablet (500 mg total) by mouth 2 (two) times daily with a meal.  . naproxen sodium (ALEVE) 220 MG tablet Take 220 mg by mouth 2 (two) times daily as needed.   [name] ULTRA test strip USE TO CHECK SUGAR LEVELS TWICE DAILY.  . pantoprazole (PROTONIX) 40 MG tablet Take 1 tablet (40 mg total) by mouth daily.  . propranolol ER (INDERAL LA) 120 MG 24 hr capsule Take 1 capsule (120 mg total) by mouth daily.  . tamsulosin (FLOMAX) 0.4 MG CAPS capsule Take 1 capsule (0.4 mg total) by mouth daily.   Facility-Administered Encounter Medications as of 10/16/2019  Medication  . cyanocobalamin ((VITAMIN B-12)) injection 1,000 mcg    Activities of Daily Living In your present state  of health, do you have any difficulty performing the following activities: 10/16/2019 11/11/2018  Hearing? N N  Comment no hearing aids -  Vision? N N  Comment eyeglasses, [facility] -  Difficulty concentrating or making decisions? N N  Walking or climbing stairs? Y Y  Dressing or bathing? Tempie Donning  Comment daughters help -  Doing errands, shopping? Y N  Comment daughters drive -  Conservation officer, nature and eating ? Y -  Comment daughters prepare food -  Using the Toilet? N -  In the past six months, have you accidently leaked urine? Y -  Comment depends -  Do you have problems with loss of bowel control? Y -  Comment depends -  Managing your Medications? Y -  Comment daughters take care of -  Managing your Finances? Y -  Comment daughters do -  Runner, broadcasting/film/video? Y -  Comment daughter helps -  Some recent data might be hidden    Patient Care Team: Valerie Roys, DO as PCP - General (Family Medicine) Minna Merritts, MD as PCP - Cardiology (Cardiology) Collier Flowers, MD as Referring Physician (Urology) Birder Robson, MD as Referring Physician (Ophthalmology) Reche Dixon, PA-C (Orthopedic Surgery) Vladimir Crofts, MD (Neurology)   Assessment:   This is a routine wellness examination for Jerimie.  Exercise Activities and Dietary recommendations Current Exercise Habits: The patient does not participate in regular exercise at present, Exercise limited by: orthopedic condition(s)  Goals Addressed   None     Fall Risk: Fall Risk  10/16/2019 12/12/2018 12/20/2017 10/26/2017 06/25/2017  Falls in the past year? 1 1 Yes Yes Yes  Number falls in past yr: 0 _0 Injury with Fall? 0 1 No No No  Risk for fall due to : - History of fall(s) - Impaired balance/gait History of fall(s);Impaired balance/gait  Follow up - Falls evaluation completed Falls evaluation completed Falls prevention discussed;Falls evaluation completed Falls prevention discussed     FALL RISK  PREVENTION PERTAINING TO THE HOME:  Any stairs in or around the home? No  If so, are there any without handrails? No   Home free of loose throw rugs in walkways, pet beds, electrical cords, etc? Yes  Adequate lighting in your home to reduce risk of falls? Yes   ASSISTIVE DEVICES UTILIZED TO PREVENT FALLS:  Life alert? No  Use of a cane, walker or w/c? Yes  Grab bars in the bathroom? Yes  Shower chair or bench in shower? Yes  Elevated toilet seat or a handicapped toilet? Yes   TIMED UP AND GO:  Unable to perform   Depression Screen PHQ 2/9 Scores 04/25/2018 10/26/2017 07/04/2017 06/13/2017  PHQ - 2 Score 6 0 1 0  PHQ- 9 Score 19 - - -    Cognitive Function     6CIT Screen 10/25/2016  What Year? 0 points  What month? 0 points  What time? 3 points  Count back from 20 0 points  Months in reverse 4 points  Repeat phrase 10 points  Total Score 17    Immunization History  Administered Date(s) Administered  . Fluad Quad(high Dose 65+) 02/11/2019  . Influenza, High Dose Seasonal PF 03/15/2016, 07/02/2017, 03/06/2018  . Influenza,inj,Quad PF,6+ Mos 03/05/2015, 04/01/2015  . Influenza-Unspecified 03/17/2014  . Moderna SARS-COVID-2 Vaccination 08/06/2019, 09/03/2019  . Pneumococcal Conjugate-13 03/17/2014  . Pneumococcal Polysaccharide-23 03/05/2001  . Td 10/22/2003  . Zoster 07/12/2009    Qualifies for Shingles Vaccine? yes  Tdap: Discussed need for TD/TDAP vaccine, patient verbalized understanding that this is not covered as a preventative with there insurance and to call the office if he develops any new skin injuries, ie: cuts, scrapes, bug bites, or open wounds.  Flu Vaccine: due 02/2020  Pneumococcal Vaccine: up to date   Covid-19 Vaccine: completed 08/06/2019, 09/03/2019  Screening Tests Health Maintenance  Topic Date Due  . TETANUS/TDAP  10/15/2020 (Originally 10/21/2013)  . FOOT EXAM  12/12/2019  . INFLUENZA VACCINE  01/04/2020  . HEMOGLOBIN A1C   01/18/2020  . OPHTHALMOLOGY EXAM  01/22/2020  . COVID-19 Vaccine  Completed  . PNA vac Low Risk Adult  Completed   Cancer Screenings:  Colorectal Screening: no longer required   Lung Cancer Screening: (Low Dose CT Chest recommended if Age 6-80 years, 30 pack-year currently smoking OR have quit w/in 15years.) does not qualify.    Additional Screening:  Hepatitis C Screening: does not qualify  Vision Screening: Recommended annual ophthalmology exams for early detection of glaucoma and other disorders of the eye. Is the patient up to date with their annual eye exam?  Yes  Who is the provider or what is the name of the office in which the pt attends annual eye exams?[facility]   Dental Screening: Recommended annual dental exams for proper oral hygiene  Community Resource Referral:  CRR required this visit?  No        Plan:  I have personally reviewed and addressed the Medicare Annual Wellness questionnaire and have noted the following in the patient's chart:  A. Medical and social history B. Use of alcohol, tobacco or illicit drugs  C. Current medications and supplements D. Functional ability and status E.  Nutritional status F.  Physical activity G. Advance directives H. List of other physicians I.  Hospitalizations, surgeries, and ER visits in previous 12 months J.  [place] such as hearing and vision if needed, cognitive and depression L. Referrals and appointments   In addition, I have reviewed  and discussed with patient certain preventive protocols, quality metrics, and best practice recommendations. A written personalized care plan for preventive services as well as general preventive health recommendations were provided to patient.   Signed,   Bevelyn Ngo, LPN  07/31/3333 Nurse Health Advisor   Nurse Notes: patients family states his blood sugars are running around 200 to 300. Asking if they should increase his metformin or if he needs to  be started on something else before his visit in June. Patients family had a lot of questions about various medications, the one daughter feels his cymbalta is to high of a dose because he seems off balance. Placed urgent ccm referral to pharmacist. Spoke with Catie who will try calling patient and family tomorrow to make sure he is taking medications correctly and answer any questions or concerns.

## 2019-10-16 NOTE — Patient Instructions (Signed)
[name] , Thank you for taking time to come for your Medicare Wellness Visit. I appreciate your ongoing commitment to your health goals. Please review the following plan we discussed and let me know if I can assist you in the future.   Screening recommendations/referrals: Colonoscopy: no longer required  Recommended yearly ophthalmology/optometry visit for glaucoma screening and checkup Recommended yearly dental visit for hygiene and checkup  Vaccinations: Influenza vaccine: due 02/2020 Pneumococcal vaccine: completed  Tdap vaccine: due now  Shingles vaccine: shingrix eligible    Covid-19: completed   Advanced directives: Advance directive discussed with you today. Once this is complete please bring a copy in to our office so we can scan it into your chart.  Conditions/risks identified: discussed chronic care management team- pharmacist will be calling   Next appointment: Follow up in one year for your annual wellness visit   Preventive Care 54 Years and Older, Male Preventive care refers to lifestyle choices and visits with your health care provider that can promote health and wellness. What does preventive care include?  A yearly physical exam. This is also called an annual well check.  Dental exams once or twice a year.  Routine eye exams. Ask your health care provider how often you should have your eyes checked.  Personal lifestyle choices, including:  Daily care of your teeth and gums.  Regular physical activity.  Eating a healthy diet.  Avoiding tobacco and drug use.  Limiting alcohol use.  Practicing safe sex.  Taking low doses of aspirin every day.  Taking vitamin and mineral supplements as recommended by your health care provider. What happens during an annual well check? The services and screenings done by your health care provider during your annual well check will depend on your age, overall health, lifestyle risk factors, and family history of  disease. Counseling  Your health care provider may ask you questions about your:  Alcohol use.  Tobacco use.  Drug use.  Emotional well-being.  Home and relationship well-being.  Sexual activity.  Eating habits.  History of falls.  Memory and ability to understand (cognition).  Work and work Statistician. Screening  You may have the following tests or measurements:  Height, weight, and BMI.  Blood pressure.  Lipid and cholesterol levels. These may be checked every 5 years, or more frequently if you are over 40 years old.  Skin check.  Lung cancer screening. You may have this screening every year starting at age 43 if you have a 30-pack-year history of smoking and currently smoke or have quit within the past 15 years.  Fecal occult blood test (FOBT) of the stool. You may have this test every year starting at age 91.  Flexible sigmoidoscopy or colonoscopy. You may have a sigmoidoscopy every 5 years or a colonoscopy every 10 years starting at age 63.  Prostate cancer screening. Recommendations will vary depending on your family history and other risks.  Hepatitis C blood test.  Hepatitis B blood test.  Sexually transmitted disease (STD) testing.  Diabetes screening. This is done by checking your blood sugar (glucose) after you have not eaten for a while (fasting). You may have this done every 1-3 years.  Abdominal aortic aneurysm (AAA) screening. You may need this if you are a current or former smoker.  Osteoporosis. You may be screened starting at age 98 if you are at high risk. Talk with your health care provider about your test results, treatment options, and if necessary, the need for more tests.  Vaccines  Your health care provider may recommend certain vaccines, such as:  Influenza vaccine. This is recommended every year.  Tetanus, diphtheria, and acellular pertussis (Tdap, Td) vaccine. You may need a Td booster every 10 years.  Zoster vaccine. You may  need this after age 55.  Pneumococcal 13-valent conjugate (PCV13) vaccine. One dose is recommended after age 64.  Pneumococcal polysaccharide (PPSV23) vaccine. One dose is recommended after age 43. Talk to your health care provider about which screenings and vaccines you need and how often you need them. This information is not intended to replace advice given to you by your health care provider. Make sure you discuss any questions you have with your health care provider. Document Released: 06/18/2015 Document Revised: 02/09/2016 Document Reviewed: 03/23/2015 Elsevier Interactive Patient Education  2017 [place] Prevention in the Home Falls can cause injuries. They can happen to people of all ages. There are many things you can do to make your home safe and to help prevent falls. What can I do on the outside of my home?  Regularly fix the edges of walkways and driveways and fix any cracks.  Remove anything that might make you trip as you walk through a door, such as a raised step or threshold.  Trim any bushes or trees on the path to your home.  Use bright outdoor lighting.  Clear any walking paths of anything that might make someone trip, such as rocks or tools.  Regularly check to see if handrails are loose or broken. Make sure that both sides of any steps have handrails.  Any raised decks and porches should have guardrails on the edges.  Have any leaves, snow, or ice cleared regularly.  Use sand or salt on walking paths during winter.  Clean up any spills in your garage right away. This includes oil or grease spills. What can I do in the bathroom?  Use night lights.  Install grab bars by the toilet and in the tub and shower. Do not use towel bars as grab bars.  Use non-skid mats or decals in the tub or shower.  If you need to sit down in the shower, use a plastic, non-slip stool.  Keep the floor dry. Clean up any water that spills on the floor as soon as it  happens.  Remove soap buildup in the tub or shower regularly.  Attach bath mats securely with double-sided non-slip rug tape.  Do not have throw rugs and other things on the floor that can make you trip. What can I do in the bedroom?  Use night lights.  Make sure that you have a light by your bed that is easy to reach.  Do not use any sheets or blankets that are too big for your bed. They should not hang down onto the floor.  Have a firm chair that has side arms. You can use this for support while you get dressed.  Do not have throw rugs and other things on the floor that can make you trip. What can I do in the kitchen?  Clean up any spills right away.  Avoid walking on wet floors.  Keep items that you use a lot in easy-to-reach places.  If you need to reach something above you, use a strong step stool that has a grab bar.  Keep electrical cords out of the way.  Do not use floor polish or wax that makes floors slippery. If you must use wax, use non-skid floor wax.  Do not have throw rugs and other things on the floor that can make you trip. What can I do with my stairs?  Do not leave any items on the stairs.  Make sure that there are handrails on both sides of the stairs and use them. Fix handrails that are broken or loose. Make sure that handrails are as long as the stairways.  Check any carpeting to make sure that it is firmly attached to the stairs. Fix any carpet that is loose or worn.  Avoid having throw rugs at the top or bottom of the stairs. If you do have throw rugs, attach them to the floor with carpet tape.  Make sure that you have a light switch at the top of the stairs and the bottom of the stairs. If you do not have them, ask someone to add them for you. What else can I do to help prevent falls?  Wear shoes that:  Do not have high heels.  Have rubber bottoms.  Are comfortable and fit you well.  Are closed at the toe. Do not wear sandals.  If you  use a stepladder:  Make sure that it is fully opened. Do not climb a closed stepladder.  Make sure that both sides of the stepladder are locked into place.  Ask someone to hold it for you, if possible.  Clearly mark and make sure that you can see:  Any grab bars or handrails.  First and last steps.  Where the edge of each step is.  Use tools that help you move around (mobility aids) if they are needed. These include:  Canes.  Walkers.  Scooters.  Crutches.  Turn on the lights when you go into a dark area. Replace any light bulbs as soon as they burn out.  Set up your furniture so you have a clear path. Avoid moving your furniture around.  If any of your floors are uneven, fix them.  If there are any pets around you, be aware of where they are.  Review your medicines with your doctor. Some medicines can make you feel dizzy. This can increase your chance of falling. Ask your doctor what other things that you can do to help prevent falls. This information is not intended to replace advice given to you by your health care provider. Make sure you discuss any questions you have with your health care provider. Document Released: 03/18/2009 Document Revised: 10/28/2015 Document Reviewed: 06/26/2014 Elsevier Interactive Patient Education  2017 [employer].

## 2019-10-17 ENCOUNTER — Telehealth: Payer: Self-pay | Admitting: Family Medicine

## 2019-10-17 ENCOUNTER — Ambulatory Visit (INDEPENDENT_AMBULATORY_CARE_PROVIDER_SITE_OTHER): Payer: PPO | Admitting: Pharmacist

## 2019-10-17 DIAGNOSIS — I5042 Chronic combined systolic (congestive) and diastolic (congestive) heart failure: Secondary | ICD-10-CM

## 2019-10-17 DIAGNOSIS — G250 Essential tremor: Secondary | ICD-10-CM

## 2019-10-17 DIAGNOSIS — E1142 Type 2 diabetes mellitus with diabetic polyneuropathy: Secondary | ICD-10-CM

## 2019-10-17 NOTE — Chronic Care Management (AMB) (Signed)
Chronic Care Management   Follow Up Note   10/17/2019 Name: Todd [name]: [id number] DOB: 15-Dec-1930  Referred by: Valerie Roys, DO Reason for referral : Chronic Care Management (Medication Management)   Todd [name] is a 86 y.o. year old male who is a primary care patient of Valerie Roys, DO. The CCM team was consulted for assistance with chronic disease management and care coordination needs.    Received return call from patient's daughter in law, [name].  Review of patient status, including review of consultants reports, relevant laboratory and other test results, and collaboration with appropriate care team members and the patient's provider was performed as part of comprehensive patient evaluation and provision of chronic care management services.    SDOH (Social Determinants of Health) assessments performed: Yes See Care Plan activities for detailed interventions related to [facility])     Outpatient Encounter Medications as of 10/17/2019  Medication Sig Note  . acetaminophen (TYLENOL 8 HOUR ARTHRITIS PAIN) 650 MG CR tablet Take 650 mg by mouth every 8 (eight) hours as needed for pain.   [name] Kitchen atorvastatin (LIPITOR) 20 MG tablet Take 1 tablet (20 mg total) by mouth daily.   . blood glucose meter kit and supplies KIT Dispense based on patient and insurance preference. Use up to four times daily as directed. (FOR ICD-9 250.00, 250.01).   . Blood Glucose Monitoring Suppl (ACCU-CHEK AVIVA PLUS) w/Device KIT Use to check sugar levels x 2 daily   . carbidopa-levodopa (SINEMET IR) 25-100 MG tablet Take 0.5 tablets by mouth 3 (three) times daily.    . digoxin (LANOXIN) 0.125 MG tablet Take 1 tablet (0.125 mg total) by mouth daily.   . DULoxetine (CYMBALTA) 60 MG capsule Take 1 capsule (60 mg total) by mouth daily.   [name] Kitchen ELIQUIS 5 MG TABS tablet Take 1 tablet (5 mg total) by mouth 2 (two) times daily.   . furosemide (LASIX) 20 MG tablet Take 1.5 tablets (30 mg) by mouth once daily (Patient  taking differently: 20 mg. Take 1.5 tablets (30 mg) by mouth once daily)   . hydrALAZINE (APRESOLINE) 50 MG tablet Take 1 tablet (50 mg total) by mouth 3 (three) times daily.   . Magnesium Oxide 400 (240 Mg) MG TABS Take 1 tablet (400 mg total) by mouth 2 (two) times daily.   . metFORMIN (GLUCOPHAGE) 500 MG tablet Take 1 tablet (500 mg total) by mouth 2 (two) times daily with a meal. 10/17/2019: Only taking once daily   . pantoprazole (PROTONIX) 40 MG tablet Take 1 tablet (40 mg total) by mouth daily.   . propranolol ER (INDERAL LA) 120 MG 24 hr capsule Take 1 capsule (120 mg total) by mouth daily.   . tamsulosin (FLOMAX) 0.4 MG CAPS capsule Take 1 capsule (0.4 mg total) by mouth daily.   [name] Kitchen docusate sodium (COLACE) 100 MG capsule Take 1 capsule (100 mg total) by mouth 2 (two) times daily as needed for mild constipation.   . gabapentin (NEURONTIN) 100 MG capsule Take 1 capsule (100 mg total) by mouth 2 (two) times daily. Once a day at night (Patient not taking: Reported on 10/17/2019) 10/17/2019: Taking 1 QPM  . Lancets (ACCU-CHEK SOFT TOUCH) lancets Use as instructed   . latanoprost (XALATAN) 0.005 % ophthalmic solution Place 1 drop into both eyes at bedtime.    . naproxen sodium (ALEVE) 220 MG tablet Take 220 mg by mouth 2 (two) times daily as needed.  08/07/2017: Patient reports taking at 2 to 3  tablets a day.   [name] ULTRA test strip USE TO CHECK SUGAR LEVELS TWICE DAILY.    Facility-Administered Encounter Medications as of 10/17/2019  Medication  . cyanocobalamin ((VITAMIN B-12)) injection 1,000 mcg     Objective:   Goals Addressed            This Visit's Progress     Patient Stated   . PharmD "We have questions about his medications" (pt-stated)       CARE PLAN ENTRY (see longtitudinal plan of care for additional care plan information)  Current Barriers:  . Polypharmacy; complex patient with multiple comorbidities including CAD, CHF, Afib, CVA, T2DM, CKD, . Son and daughter in  law [name], [phone]) live with patient and [name] fills a pill box for him. Reviewed medications w/ [name] . Most recent eGFR: >60 o T2DM: last A1c 8.2%, increased metformin to 500 mg BID, but patient had an old bottle and has continued to take 500 mg daily  o CAD, HTN, CHF: follows w/ [facility]; furosemide 20 mg daily, hydralazine 50 mg TID, propanolol LA 120 mg (also used to help tremor), digoxin 0.125 mg daily o Afib: Eliquis 5 mg BID o ASCVD: atorvastatin 20 mg daily; last LDL at goal <70 o BPH: tamsulosin 0.4 mg daily - 90 day supply last filled 07/18/19 o Suspected PD: carbidopa/levodopa 25/100 0.5 mg TID; [name] has questions about the intended dosing, was somehow entered into our EHR as 3 tablets every other day.  o Tremor/neuropathy: duloxetine 60 mg daily, gabapentin - gabapentin has not been filled in 2021, duloxetine has not been filled since 06/2018; [name] notes that her sister things that duloxetine 60 mg is "too much" for the patient, and that he is woozy. Wonders if this can be moved to the evening. o GERD: pantoprazole 40 mg daily  Pharmacist Clinical Goal(s):  [name], patient will work with PharmD and provider towards optimized medication management  Interventions: . Comprehensive medication review performed; medication list updated in electronic medical record . Inter-disciplinary care team collaboration (see longitudinal plan of care) . Discussed medication questions with [name].  o Clarified that carbidopa/levodopa is to be dosed 1/2 tab TID. Reviewed titration instructions in phone note from Dr. Manuella Ghazi, but [name] was confused. Encouraged to schedule f/u with neurology as soon as possible to discuss need for titration o Clarified that patient is to increase metformin to 500 mg BID. [name] verbalized understanding.  o Noted that patient could take duloxetine in the evenings, if they preferred . Spoke with [name], reviewed medications.  Discussed how to best split up patient's medications to reduce having to take too many at once.   Patient Self Care Activities:  . Patient will take medications as prescribe  Please see past updates related to this goal by clicking on the "Past Updates" button in the selected goal          Plan:  - Scheduled f/u call in ~ 2 weeks  Catie Darnelle Maffucci, PharmD, [place] [phone]

## 2019-10-17 NOTE — Telephone Encounter (Signed)
Called [place] back. Left voice mail (#2 today).   Called [name]. Left voicemail.

## 2019-10-17 NOTE — Telephone Encounter (Signed)
Copied from [place] [id number]. Topic: General - Other >> Oct 17, 2019 11:57 AM Leward Quan A wrote: Reason for CRM: Patient daughter in law [name] called to say that the medication in the house does not match with new instructions given. Asking if a nurse can give her a call at Ph# [phone]

## 2019-10-17 NOTE — Telephone Encounter (Signed)
Is this something I should send back to Catie, since she had a visit with them today?

## 2019-10-17 NOTE — Telephone Encounter (Signed)
Forwarding to Catie

## 2019-10-17 NOTE — Chronic Care Management (AMB) (Signed)
Chronic Care Management   Follow Up Note   10/17/2019 Name: Todd [name] MRN: [id number] DOB: [birth year]  Referred by: Valerie Roys, DO Reason for referral : Chronic Care Management (Medication Management)   Todd [name] is a 86 y.o. year old male who is a primary care patient of Valerie Roys, DO. The CCM team was consulted for assistance with chronic disease management and care coordination needs.    Urgent referral for medication confusion.  Review of patient status, including review of consultants reports, relevant laboratory and other test results, and collaboration with appropriate care team members and the patient's provider was performed as part of comprehensive patient evaluation and provision of chronic care management services.    SDOH (Social Determinants of Health) assessments performed: No See Care Plan activities for detailed interventions related to [facility])     Outpatient Encounter Medications as of 10/17/2019  Medication Sig Note  . carbidopa-levodopa (SINEMET IR) 25-100 MG tablet Take 0.5 tablets by mouth 3 (three) times daily.    . DULoxetine (CYMBALTA) 60 MG capsule Take 1 capsule (60 mg total) by mouth daily.   . metFORMIN (GLUCOPHAGE) 500 MG tablet Take 1 tablet (500 mg total) by mouth 2 (two) times daily with a meal. 10/17/2019: Only taking once daily   . acetaminophen (TYLENOL 8 HOUR ARTHRITIS PAIN) 650 MG CR tablet Take 650 mg by mouth every 8 (eight) hours as needed for pain. 08/07/2017: Patient reports taking 2 tablets twice daily   . atorvastatin (LIPITOR) 20 MG tablet Take 1 tablet (20 mg total) by mouth daily.   . blood glucose meter kit and supplies KIT Dispense based on patient and insurance preference. Use up to four times daily as directed. (FOR ICD-9 250.00, 250.01).   . Blood Glucose Monitoring Suppl (ACCU-CHEK AVIVA PLUS) w/Device KIT Use to check sugar levels x 2 daily   . digoxin (LANOXIN) 0.125 MG tablet Take 1 tablet (0.125 mg total) by mouth  daily.   [name] Kitchen docusate sodium (COLACE) 100 MG capsule Take 1 capsule (100 mg total) by mouth 2 (two) times daily as needed for mild constipation.   [name] Kitchen ELIQUIS 5 MG TABS tablet Take 1 tablet (5 mg total) by mouth 2 (two) times daily.   . furosemide (LASIX) 20 MG tablet Take 1.5 tablets (30 mg) by mouth once daily   . gabapentin (NEURONTIN) 100 MG capsule Take 1 capsule (100 mg total) by mouth 2 (two) times daily. Once a day at night   . hydrALAZINE (APRESOLINE) 50 MG tablet Take 1 tablet (50 mg total) by mouth 3 (three) times daily.   . Lancets (ACCU-CHEK SOFT TOUCH) lancets Use as instructed   . latanoprost (XALATAN) 0.005 % ophthalmic solution Place 1 drop into both eyes at bedtime.    . Magnesium Oxide 400 (240 Mg) MG TABS Take 1 tablet (400 mg total) by mouth 2 (two) times daily.   . naproxen sodium (ALEVE) 220 MG tablet Take 220 mg by mouth 2 (two) times daily as needed.  08/07/2017: Patient reports taking at 2 to 3 tablets a day.   Todd [name] ULTRA test strip USE TO CHECK SUGAR LEVELS TWICE DAILY.   . pantoprazole (PROTONIX) 40 MG tablet Take 1 tablet (40 mg total) by mouth daily.   . propranolol ER (INDERAL LA) 120 MG 24 hr capsule Take 1 capsule (120 mg total) by mouth daily.   . tamsulosin (FLOMAX) 0.4 MG CAPS capsule Take 1 capsule (0.4 mg total) by mouth daily.  Facility-Administered Encounter Medications as of 10/17/2019  Medication  . cyanocobalamin ((VITAMIN B-12)) injection 1,000 mcg     Objective:   Goals Addressed            This Visit's Progress     Patient Stated   . PharmD "We have questions about his medications" (pt-stated)       CARE PLAN ENTRY (see longtitudinal plan of care for additional care plan information)  Current Barriers:  . Polypharmacy; complex patient with multiple comorbidities including CAD, CHF, Afib, CVA, T2DM, CKD, . Son and daughter in law Todd [name], [phone]) live with patient and Todd [name] fills a pill box for him . Most recent eGFR:  >60 o T2DM: last A1c 8.2%, increased metformin to 500 mg BID, but patient had an old bottle and has continued to take 500 mg daily  o CAD, HTN, CHF: follows w/ [facility]; furosemide 20 mg daily, hydralazine 50 mg TID, propanolol LA 120 mg (also used to help tremor), digoxin 0.125 mg daily o Afib: Eliquis 5 mg BID o ASCVD: atorvastatin 20 mg daily; last LDL at goal <70 o BPH: tamsulosin 0.4 mg daily - 90 day supply last filled 07/18/19 o Suspected PD: carbidopa/levodopa 25/100 0.5 mg TID; Todd [name] has questions about the intended dosing, was somehow entered into our EHR as 3 tablets every other day.  o Tremor/neuropathy: duloxetine 60 mg daily, gabapentin - gabapentin has not been filled in 2021, duloxetine has not been filled since 06/2018; Todd [name] notes that her sister things that duloxetine 60 mg is "too much" for the patient, and that he is woozy. Wonders if this can be moved to the evening. o GERD: pantoprazole 40 mg daily  Pharmacist Clinical Goal(s):  [name], patient will work with PharmD and provider towards optimized medication management  Interventions: . Comprehensive medication review performed; medication list updated in electronic medical record . Inter-disciplinary care team collaboration (see longitudinal plan of care) . Discussed medication questions with Todd [name].  o Clarified that carbidopa/levodopa is to be dosed 1/2 tab TID. Reviewed titration instructions in phone note from Dr. Manuella Ghazi, but Todd [name] was confused. Encouraged to schedule f/u with neurology as soon as possible to discuss need for titration o Clarified that patient is to increase metformin to 500 mg BID. Todd [name] verbalized understanding.  o Noted that patient could take duloxetine in the evenings, if they preferred . Attempted to contacted the daughter in law that manages his pill box, Todd [name], but had to leave a voicemail asking her to return my call at her convenience.   Patient Self Care Activities:    . Patient will take medications as prescribe  Please see past updates related to this goal by clicking on the "Past Updates" button in the selected goal          Plan:  - Scheduled f/u call in ~2 weeks   Catie Darnelle Maffucci, PharmD, [place] [phone]

## 2019-10-17 NOTE — Patient Instructions (Addendum)
Todd [name],   Here is how we talked about filling Todd [name]'s pill box:  Morning: - Metformin 500 mg (blood sugars) - Carbidopa/levodopa 25/100 mg - 1/2 tablet - (tremor) - Digoxin 0.125 mg (heart) - Eliquis 5 mg (stroke prevention) - Furosemide 20 mg (fluid pill) - Hydralazine 50 mg (blood pressure) - Propranolol ER 120 mg (blood pressure, heart rate, tremor)  Noon: - Carbidopa/levodopa 25/100 mg - 1/2 tablet - (tremor) - Hydralazine 50 mg (blood pressure) - Pantoprazole 40 mg (acid reflux) - Tamsulosin 0.4 mg (prostate) - Magnesium   Evening:  - Metformin 500 mg (blood sugars) - Carbidopa/levodopa 25/100 mg - 1/2 tablet - (tremor) - Duloxetine 60 mg (pain, mood) - Atorvastatin 20 mg (cholesterol, stroke prevention) - Eliquis 5 mg (stroke prevention) - Gabapentin 100 mg (nerve pain) - Hydralazine 50 mg (blood pressure)  Call me with any questions or concerns! Call [facility] to schedule a follow up appointment regarding Todd [name]'s tremor  Todd [name], PharmD [phone]  Visit Information  Goals Addressed            This Visit's Progress     Patient Stated   . PharmD "We have questions about his medications" (pt-stated)       CARE PLAN ENTRY (see longtitudinal plan of care for additional care plan information)  Current Barriers:  . Polypharmacy; complex patient with multiple comorbidities including CAD, CHF, Afib, CVA, T2DM, CKD, . Son and daughter in law Todd [name], [phone]) live with patient and Todd [name] fills a pill box for him. Reviewed medications w/ Todd [name] . Most recent eGFR: >60 o T2DM: last A1c 8.2%, increased metformin to 500 mg BID, but patient had an old bottle and has continued to take 500 mg daily  o CAD, HTN, CHF: follows w/ [facility]; furosemide 20 mg daily, hydralazine 50 mg TID, propanolol LA 120 mg (also used to help tremor), digoxin 0.125 mg daily o Afib: Eliquis 5 mg BID o ASCVD: atorvastatin 20 mg daily; last LDL at  goal <70 o BPH: tamsulosin 0.4 mg daily - 90 day supply last filled 07/18/19 o Suspected PD: carbidopa/levodopa 25/100 0.5 mg TID; Todd [name] has questions about the intended dosing, was somehow entered into our EHR as 3 tablets every other day.  o Tremor/neuropathy: duloxetine 60 mg daily, gabapentin - gabapentin has not been filled in 2021, duloxetine has not been filled since 06/2018; Todd [name] notes that her sister things that duloxetine 60 mg is "too much" for the patient, and that he is woozy. Wonders if this can be moved to the evening. o GERD: pantoprazole 40 mg daily  Pharmacist Clinical Goal(s):  [name], patient will work with PharmD and provider towards optimized medication management  Interventions: . Comprehensive medication review performed; medication list updated in electronic medical record . Inter-disciplinary care team collaboration (see longitudinal plan of care) . Discussed medication questions with Todd [name].  o Clarified that carbidopa/levodopa is to be dosed 1/2 tab TID. Reviewed titration instructions in phone note from Dr. Manuella Ghazi, but Todd [name] was confused. Encouraged to schedule f/u with neurology as soon as possible to discuss need for titration o Clarified that patient is to increase metformin to 500 mg BID. Todd [name] verbalized understanding.  o Noted that patient could take duloxetine in the evenings, if they preferred . Spoke with Todd [name], reviewed medications. Discussed how to best split up patient's medications to reduce having to take too many at once.   Patient Self Care Activities:  . Patient will take  medications as prescribe  Please see past updates related to this goal by clicking on the "Past Updates" button in the selected goal         Patient verbalizes understanding of instructions provided today.  Plan:  - Scheduled f/u call in ~2 weeks  Todd [name], PharmD, [place]   [phone]

## 2019-10-20 ENCOUNTER — Other Ambulatory Visit
Admission: RE | Admit: 2019-10-20 | Discharge: 2019-10-20 | Disposition: A | Discharge status: Home / Self Care | Payer: PPO | Source: Ambulatory Visit | Attending: Physician Assistant | Admitting: Physician Assistant

## 2019-10-20 DIAGNOSIS — I5020 Unspecified systolic (congestive) heart failure: Secondary | ICD-10-CM

## 2019-10-20 DIAGNOSIS — Z79899 Other long term (current) drug therapy: Secondary | ICD-10-CM | POA: Diagnosis not present

## 2019-10-20 LAB — DIGOXIN LEVEL: Digoxin Level: 1.2 ng/mL (ref 0.8–2.0)

## 2019-10-20 LAB — BASIC METABOLIC PANEL
Anion gap: 9 (ref 5–15)
BUN: 17 mg/dL (ref 8–23)
CO2: 32 mmol/L (ref 22–32)
Calcium: 9.4 mg/dL (ref 8.9–10.3)
Chloride: 94 mmol/L — ABNORMAL LOW (ref 98–111)
Creatinine, Ser: 1.09 mg/dL (ref 0.61–1.24)
GFR calc Af Amer: >60 mL/min (ref >60)
GFR calc non Af Amer: >60 mL/min (ref >60)
Glucose, Bld: 349 mg/dL — ABNORMAL HIGH (ref 70–99)
Potassium: 4.7 mmol/L (ref 3.5–5.1)
Sodium: 135 mmol/L (ref 135–145)

## 2019-10-21 ENCOUNTER — Telehealth: Payer: Self-pay | Admitting: *Deleted

## 2019-10-21 MED ORDER — DIGOXIN 125 MCG PO TABS: 0.1250 mg | ORAL_TABLET | ORAL | 0 refills | Status: DC

## 2019-10-21 NOTE — Telephone Encounter (Signed)
Patient's daughter, Ardeen Fillers, notified of results and to take digoxin 0.125 mg every other day. Med list updated. No further questions at this time.

## 2019-10-21 NOTE — Telephone Encounter (Signed)
-----   Message from Arvil Chaco, PA-C sent at 10/21/2019  5:01 PM EDT ----- Please let Mr. Solla know his digoxin level was mildly elevated at 1.2.   Per Dr. Rockey Situ, please have the patient start taking his digoxin 0.125mg  every other day (decreased frequency from daily to every other day). Let us know if questions.

## 2019-10-22 ENCOUNTER — Ambulatory Visit: Payer: Self-pay | Admitting: Pharmacist

## 2019-10-22 DIAGNOSIS — E1142 Type 2 diabetes mellitus with diabetic polyneuropathy: Secondary | ICD-10-CM

## 2019-10-22 DIAGNOSIS — I5042 Chronic combined systolic (congestive) and diastolic (congestive) heart failure: Secondary | ICD-10-CM

## 2019-10-22 NOTE — Chronic Care Management (AMB) (Signed)
Chronic Care Management   Follow Up Note   10/22/2019 Name: Markes Shatswell MRN: [id number] DOB: 1930/10/03  Referred by: Valerie Roys, DO Reason for referral : Chronic Care Management (Medication Management)   Ulyess Muto is a 86 y.o. year old male who is a primary care patient of Valerie Roys, DO. The CCM team was consulted for assistance with chronic disease management and care coordination needs.    Received message that [name] was returning a call  Review of patient status, including review of consultants reports, relevant laboratory and other test results, and collaboration with appropriate care team members and the patient's provider was performed as part of comprehensive patient evaluation and provision of chronic care management services.    SDOH (Social Determinants of Health) assessments performed: No See Care Plan activities for detailed interventions related to [facility])     Outpatient Encounter Medications as of 10/22/2019  Medication Sig Note  . acetaminophen (TYLENOL 8 HOUR ARTHRITIS PAIN) 650 MG CR tablet Take 650 mg by mouth every 8 (eight) hours as needed for pain.   [name] Kitchen atorvastatin (LIPITOR) 20 MG tablet Take 1 tablet (20 mg total) by mouth daily.   . blood glucose meter kit and supplies KIT Dispense based on patient and insurance preference. Use up to four times daily as directed. (FOR ICD-9 250.00, 250.01).   . Blood Glucose Monitoring Suppl (ACCU-CHEK AVIVA PLUS) w/Device KIT Use to check sugar levels x 2 daily   . carbidopa-levodopa (SINEMET IR) 25-100 MG tablet Take 0.5 tablets by mouth 3 (three) times daily.    . digoxin (LANOXIN) 0.125 MG tablet Take 1 tablet (0.125 mg total) by mouth every other day.   . docusate sodium (COLACE) 100 MG capsule Take 1 capsule (100 mg total) by mouth 2 (two) times daily as needed for mild constipation.   . DULoxetine (CYMBALTA) 60 MG capsule Take 1 capsule (60 mg total) by mouth daily.   [name] Kitchen ELIQUIS 5 MG TABS tablet Take 1 tablet  (5 mg total) by mouth 2 (two) times daily.   . furosemide (LASIX) 20 MG tablet Take 1.5 tablets (30 mg) by mouth once daily (Patient taking differently: 20 mg. Take 1.5 tablets (30 mg) by mouth once daily)   . gabapentin (NEURONTIN) 100 MG capsule Take 1 capsule (100 mg total) by mouth 2 (two) times daily. Once a day at night (Patient not taking: Reported on 10/17/2019) 10/17/2019: Taking 1 QPM  . hydrALAZINE (APRESOLINE) 50 MG tablet Take 1 tablet (50 mg total) by mouth 3 (three) times daily.   . Lancets (ACCU-CHEK SOFT TOUCH) lancets Use as instructed   . latanoprost (XALATAN) 0.005 % ophthalmic solution Place 1 drop into both eyes at bedtime.    . Magnesium Oxide 400 (240 Mg) MG TABS Take 1 tablet (400 mg total) by mouth 2 (two) times daily.   . metFORMIN (GLUCOPHAGE) 500 MG tablet Take 1 tablet (500 mg total) by mouth 2 (two) times daily with a meal. 10/17/2019: Only taking once daily   . naproxen sodium (ALEVE) 220 MG tablet Take 220 mg by mouth 2 (two) times daily as needed.  08/07/2017: Patient reports taking at 2 to 3 tablets a day.   [name] ULTRA test strip USE TO CHECK SUGAR LEVELS TWICE DAILY.   . pantoprazole (PROTONIX) 40 MG tablet Take 1 tablet (40 mg total) by mouth daily.   . propranolol ER (INDERAL LA) 120 MG 24 hr capsule Take 1 capsule (120 mg total) by mouth daily.   [name] Kitchen  tamsulosin (FLOMAX) 0.4 MG CAPS capsule Take 1 capsule (0.4 mg total) by mouth daily.    Facility-Administered Encounter Medications as of 10/22/2019  Medication  . cyanocobalamin ((VITAMIN B-12)) injection 1,000 mcg     Objective:   Goals Addressed            This Visit's Progress     Patient Stated   . PharmD "We have questions about his medications" (pt-stated)       CARE PLAN ENTRY (see longtitudinal plan of care for additional care plan information)  Current Barriers:  . Polypharmacy; complex patient with multiple comorbidities including CAD, CHF, Afib, CVA, T2DM, CKD, . Son and daughter in law  [name], [phone]) live with patient and [name] fills a pill box for him. o Received call from [place] today. She thought she had missed a call from [facility]  . Most recent eGFR: >60 o T2DM: last A1c 8.2%, increased metformin to 500 mg BID, but patient had an old bottle and has continued to take 500 mg daily  o CAD, HTN, CHF: follows w/ [facility]; furosemide 20 mg daily, hydralazine 50 mg TID, propanolol LA 120 mg (also used to help tremor), digoxin 0.125 mg every other day - recent reduced d/t high digoxin level o Afib: Eliquis 5 mg BID o ASCVD: atorvastatin 20 mg daily; last LDL at goal <70 o BPH: tamsulosin 0.4 mg daily - 90 day supply last filled 07/18/19 o Suspected PD: carbidopa/levodopa 25/100 0.5 mg TID; [name] has questions about the intended dosing, was somehow entered into our EHR as 3 tablets every other day.  o Tremor/neuropathy: duloxetine 60 mg daily, gabapentin - gabapentin has not been filled in 2021 o GERD: pantoprazole 40 mg daily  Pharmacist Clinical Goal(s):  [name], patient will work with PharmD and provider towards optimized medication management  Interventions: . Will mail [name] an updated medication list w/ digoxin reduced to every other day.   Patient Self Care Activities:  . Patient will take medications as prescribe  Please see past updates related to this goal by clicking on the "Past Updates" button in the selected goal          Plan:  - Will outreach as previously scheduled  Catie Darnelle Maffucci, PharmD, [place] [phone]

## 2019-10-22 NOTE — Patient Instructions (Addendum)
Todd [name],   Here is an updated medication list with the updated digoxin dose as changed by Dr. Saunders Revel the other day. [place] should now be given digoxin every other day, instead of every day.   Morning: - Metformin 500 mg (blood sugars) - Carbidopa/levodopa 25/100 mg - 1/2 tablet - (tremor) - Digoxin 0.125 mg (heart) every other day - Eliquis 5 mg (stroke prevention) - Furosemide 20 mg (fluid pill) - Hydralazine 50 mg (blood pressure) - Propranolol ER 120 mg (blood pressure, heart rate, tremor)  Noon: - Carbidopa/levodopa 25/100 mg - 1/2 tablet - (tremor) - Hydralazine 50 mg (blood pressure) - Pantoprazole 40 mg (acid reflux) - Tamsulosin 0.4 mg (prostate) - Magnesium   Evening:  - Metformin 500 mg (blood sugars) - Carbidopa/levodopa 25/100 mg - 1/2 tablet - (tremor) - Duloxetine 60 mg (pain, mood) - Atorvastatin 20 mg (cholesterol, stroke prevention) - Eliquis 5 mg (stroke prevention) - Gabapentin 100 mg (nerve pain) - Hydralazine 50 mg (blood pressure)  Call me with any questions or concerns! Call [facility] to schedule a follow up appointment regarding Supreme's tremor  Catie Darnelle Maffucci, PharmD [phone]  Visit Information  Goals Addressed            This Visit's Progress     Patient Stated   . PharmD "We have questions about his medications" (pt-stated)       CARE PLAN ENTRY (see longtitudinal plan of care for additional care plan information)  Current Barriers:  . Polypharmacy; complex patient with multiple comorbidities including CAD, CHF, Afib, CVA, T2DM, CKD, . Son and daughter in law [name], [phone]) live with patient and Todd [name] fills a pill box for him. o Received call from [place] today. She thought she had missed a call from [facility]  . Most recent eGFR: >60 o T2DM: last A1c 8.2%, increased metformin to 500 mg BID, but patient had an old bottle and has continued to take 500 mg daily  o CAD, HTN, CHF: follows w/ [facility];  furosemide 20 mg daily, hydralazine 50 mg TID, propanolol LA 120 mg (also used to help tremor), digoxin 0.125 mg every other day - recent reduced d/t high digoxin level o Afib: Eliquis 5 mg BID o ASCVD: atorvastatin 20 mg daily; last LDL at goal <70 o BPH: tamsulosin 0.4 mg daily - 90 day supply last filled 07/18/19 o Suspected PD: carbidopa/levodopa 25/100 0.5 mg TID; [name] has questions about the intended dosing, was somehow entered into our EHR as 3 tablets every other day.  o Tremor/neuropathy: duloxetine 60 mg daily, gabapentin - gabapentin has not been filled in 2021 o GERD: pantoprazole 40 mg daily  Pharmacist Clinical Goal(s):  [name], patient will work with PharmD and provider towards optimized medication management  Interventions: . Will mail Todd [name] an updated medication list w/ digoxin reduced to every other day.   Patient Self Care Activities:  . Patient will take medications as prescribe  Please see past updates related to this goal by clicking on the "Past Updates" button in the selected goal         Patient verbalizes understanding of instructions provided today.   Plan:  - Will outreach as previously scheduled  Catie Darnelle Maffucci, PharmD, [place]  [phone]

## 2019-10-23 DIAGNOSIS — E119 Type 2 diabetes mellitus without complications: Secondary | ICD-10-CM | POA: Diagnosis not present

## 2019-10-23 DIAGNOSIS — I69351 Hemiplegia and hemiparesis following cerebral infarction affecting right dominant side: Secondary | ICD-10-CM | POA: Diagnosis not present

## 2019-10-23 DIAGNOSIS — I2510 Atherosclerotic heart disease of native coronary artery without angina pectoris: Secondary | ICD-10-CM | POA: Diagnosis not present

## 2019-10-23 DIAGNOSIS — I69322 Dysarthria following cerebral infarction: Secondary | ICD-10-CM | POA: Diagnosis not present

## 2019-10-24 ENCOUNTER — Other Ambulatory Visit: Payer: Self-pay | Admitting: Family Medicine

## 2019-11-03 DIAGNOSIS — E119 Type 2 diabetes mellitus without complications: Secondary | ICD-10-CM | POA: Diagnosis not present

## 2019-11-04 ENCOUNTER — Ambulatory Visit: Payer: Self-pay | Admitting: Pharmacist

## 2019-11-04 DIAGNOSIS — G250 Essential tremor: Secondary | ICD-10-CM

## 2019-11-04 DIAGNOSIS — E1142 Type 2 diabetes mellitus with diabetic polyneuropathy: Secondary | ICD-10-CM

## 2019-11-04 NOTE — Patient Instructions (Signed)
Visit Information  Goals Addressed            This Visit's Progress     Patient Stated   . PharmD "We have questions about his medications" (pt-stated)       CARE PLAN ENTRY (see longtitudinal plan of care for additional care plan information)  Current Barriers:  . Polypharmacy; complex patient with multiple comorbidities including CAD, CHF, Afib, CVA, T2DM, CKD o Still has not scheduled f/u with Dr. Manuella [name], though Todd [name] notes that patient's tremor has been more controlled lately o Confirms that they received the list of how to fill Todd [name]'s pill box that I mailed  . Son and daughter in law Todd [name], [phone]) live with patient and Todd [name] fills a pill box for him . Most recent eGFR: >60 o T2DM: last A1c 8.2%, increased metformin to 500 mg BID - Reports yesterday 260, this morning 240; had been in the 300s before increasing metformin o CAD, HTN, CHF: follows w/ [facility]; furosemide 20 mg daily, hydralazine 50 mg TID, propanolol LA 120 mg (also used to help tremor), digoxin 0.125 mg every other day o Afib: Eliquis 5 mg BID o ASCVD: atorvastatin 20 mg daily; last LDL at goal <70 o BPH: tamsulosin 0.4 mg daily o Suspected PD: carbidopa/levodopa 25/100 0.5 mg TID;  o Tremor/neuropathy: duloxetine 60 mg daily o GERD: pantoprazole 40 mg daily  Pharmacist Clinical Goal(s):  [name], patient will work with PharmD and provider towards optimized medication management  Interventions: . Comprehensive medication review performed, medication list updated in electronic medical record . Inter-disciplinary care team collaboration (see longitudinal plan of care) . Reviewed w/ Todd [name] that she needs to call Todd [name] office to schedule f/u regarding Todd [name]'s tremor. She verbalizes understanding . Reviewed goal A1c, goal fasting, and goal 2 hour post prandial. Based on home fasting readings, unlikely that patient's A1c will be at goal. Consider increasing metformin,  pending A1c.  . Encouraged to discuss adding daughter in law, Todd [name], to Todd [name] at upcoming appt w/ Todd [name], since she manages patient's medications on a daily basis.   Patient Self Care Activities:  . Patient will take medications as prescribed  Please see past updates related to this goal by clicking on the "Past Updates" button in the selected goal         Patient verbalizes understanding of instructions provided today.    Plan:  - Scheduled f/u call in ~ 8 weeks  Todd [name], PharmD, Todd [name] [phone]

## 2019-11-04 NOTE — Chronic Care Management (AMB) (Signed)
Chronic Care Management   Follow Up Note   11/04/2019 Name: Todd [name] MRN: [id number] DOB: 26-Jan-1931  Referred by: Valerie Roys, DO Reason for referral : Chronic Care Management (Medication Management)   Todd [name] is a 86 y.o. year old male who is a primary care patient of Valerie Roys, DO. The CCM team was consulted for assistance with chronic disease management and care coordination needs.    Contacted patient's daughter, Todd [name], for medication management review.   Review of patient status, including review of consultants reports, relevant laboratory and other test results, and collaboration with appropriate care team members and the patient's provider was performed as part of comprehensive patient evaluation and provision of chronic care management services.    SDOH (Social Determinants of Health) assessments performed: Yes See Care Plan activities for detailed interventions related to [facility])     Outpatient Encounter Medications as of 11/04/2019  Medication Sig Note  . atorvastatin (LIPITOR) 20 MG tablet Take 1 tablet (20 mg total) by mouth daily.   . blood glucose meter kit and supplies KIT Dispense based on patient and insurance preference. Use up to four times daily as directed. (FOR ICD-9 250.00, 250.01).   . Blood Glucose Monitoring Suppl (ACCU-CHEK AVIVA PLUS) w/Device KIT Use to check sugar levels x 2 daily   . carbidopa-levodopa (SINEMET IR) 25-100 MG tablet Take 0.5 tablets by mouth 3 (three) times daily.    . digoxin (LANOXIN) 0.125 MG tablet Take 1 tablet (0.125 mg total) by mouth every other day.   . DULoxetine (CYMBALTA) 60 MG capsule Take 1 capsule (60 mg total) by mouth daily.   [name] Kitchen ELIQUIS 5 MG TABS tablet Take 1 tablet (5 mg total) by mouth 2 (two) times daily.   . furosemide (LASIX) 20 MG tablet Take 1.5 tablets (30 mg) by mouth once daily (Patient taking differently: 20 mg. Take 1.5 tablets (30 mg) by mouth once daily)   . hydrALAZINE (APRESOLINE) 50 MG  tablet Take 1 tablet (50 mg total) by mouth 3 (three) times daily.   . metFORMIN (GLUCOPHAGE) 500 MG tablet Take 1 tablet (500 mg total) by mouth 2 (two) times daily with a meal.   . pantoprazole (PROTONIX) 40 MG tablet Take 1 tablet (40 mg total) by mouth daily.   . propranolol ER (INDERAL LA) 120 MG 24 hr capsule Take 1 capsule (120 mg total) by mouth daily.   . tamsulosin (FLOMAX) 0.4 MG CAPS capsule Take 1 capsule (0.4 mg total) by mouth daily.   [name] Kitchen acetaminophen (TYLENOL 8 HOUR ARTHRITIS PAIN) 650 MG CR tablet Take 650 mg by mouth every 8 (eight) hours as needed for pain.   [name] Kitchen docusate sodium (COLACE) 100 MG capsule Take 1 capsule (100 mg total) by mouth 2 (two) times daily as needed for mild constipation.   . gabapentin (NEURONTIN) 100 MG capsule Take 1 capsule (100 mg total) by mouth 2 (two) times daily. Once a day at night (Patient not taking: Reported on 10/17/2019) 10/17/2019: Taking 1 QPM  . Lancets (ACCU-CHEK SOFT TOUCH) lancets Use as instructed   . latanoprost (XALATAN) 0.005 % ophthalmic solution Place 1 drop into both eyes at bedtime.    . Magnesium Oxide 400 (240 Mg) MG TABS Take 1 tablet (400 mg total) by mouth 2 (two) times daily.   . naproxen sodium (ALEVE) 220 MG tablet Take 220 mg by mouth 2 (two) times daily as needed.  08/07/2017: Patient reports taking at 2 to 3 tablets a day.   [name] Kitchen  ONETOUCH ULTRA test strip USE TO CHECK SUGAR LEVELS TWICE DAILY.    Facility-Administered Encounter Medications as of 11/04/2019  Medication  . cyanocobalamin ((VITAMIN B-12)) injection 1,000 mcg     Objective:   Goals Addressed            This Visit's Progress     Patient Stated   . PharmD "We have questions about his medications" (pt-stated)       CARE PLAN ENTRY (see longtitudinal plan of care for additional care plan information)  Current Barriers:  . Polypharmacy; complex patient with multiple comorbidities including CAD, CHF, Afib, CVA, T2DM, CKD o Still has not scheduled f/u with  Dr. Manuella Ghazi, though Todd [name] notes that patient's tremor has been more controlled lately o Confirms that they received the list of how to fill Todd [name]'s pill box that I mailed  . Son and daughter in law Todd [name], [phone]) live with patient and Todd [name] fills a pill box for him . Most recent eGFR: >60 o T2DM: last A1c 8.2%, increased metformin to 500 mg BID - Reports yesterday 260, this morning 240; had been in the 300s before increasing metformin o CAD, HTN, CHF: follows w/ [facility]; furosemide 20 mg daily, hydralazine 50 mg TID, propanolol LA 120 mg (also used to help tremor), digoxin 0.125 mg every other day o Afib: Eliquis 5 mg BID o ASCVD: atorvastatin 20 mg daily; last LDL at goal <70 o BPH: tamsulosin 0.4 mg daily o Suspected PD: carbidopa/levodopa 25/100 0.5 mg TID;  o Tremor/neuropathy: duloxetine 60 mg daily o GERD: pantoprazole 40 mg daily  Pharmacist Clinical Goal(s):  [name], patient will work with PharmD and provider towards optimized medication management  Interventions: . Comprehensive medication review performed, medication list updated in electronic medical record . Inter-disciplinary care team collaboration (see longitudinal plan of care) . Reviewed w/ Todd [name] that she needs to call Dr. Trena Platt office to schedule f/u regarding Todd [name]'s tremor. She verbalizes understanding . Reviewed goal A1c, goal fasting, and goal 2 hour post prandial. Based on home fasting readings, unlikely that patient's A1c will be at goal. Consider increasing metformin, pending A1c.  . Encouraged to discuss adding daughter in law, Todd [name], to [facility] at upcoming appt w/ Dr. Wynetta Emery, since she manages patient's medications on a daily basis.   Patient Self Care Activities:  . Patient will take medications as prescribed  Please see past updates related to this goal by clicking on the "Past Updates" button in the selected goal          Plan:  - Scheduled f/u call in ~  8 weeks  Catie Darnelle Maffucci, PharmD, [place] [phone]

## 2019-11-10 ENCOUNTER — Other Ambulatory Visit: Payer: Self-pay

## 2019-11-10 ENCOUNTER — Ambulatory Visit (INDEPENDENT_AMBULATORY_CARE_PROVIDER_SITE_OTHER): Payer: PPO | Admitting: Family Medicine

## 2019-11-10 ENCOUNTER — Encounter: Payer: Self-pay | Admitting: Family Medicine

## 2019-11-10 VITALS — BP 120/64 | HR 94 | Temp 97.9°F

## 2019-11-10 DIAGNOSIS — I501 Left ventricular failure: Secondary | ICD-10-CM | POA: Diagnosis not present

## 2019-11-10 DIAGNOSIS — R0902 Hypoxemia: Secondary | ICD-10-CM | POA: Diagnosis not present

## 2019-11-10 DIAGNOSIS — E538 Deficiency of other specified B group vitamins: Secondary | ICD-10-CM

## 2019-11-10 DIAGNOSIS — I2510 Atherosclerotic heart disease of native coronary artery without angina pectoris: Secondary | ICD-10-CM | POA: Diagnosis not present

## 2019-11-10 DIAGNOSIS — E1142 Type 2 diabetes mellitus with diabetic polyneuropathy: Secondary | ICD-10-CM | POA: Diagnosis not present

## 2019-11-10 LAB — BAYER DCA HB A1C WAIVED: HB A1C (BAYER DCA - WAIVED): 8.4 % — ABNORMAL HIGH (ref <7.0)

## 2019-11-10 MED ORDER — METFORMIN HCL 500 MG PO TABS: 500.0000 mg | ORAL_TABLET | Freq: Two times a day (BID) | ORAL | 1 refills | Status: DC

## 2019-11-10 MED ORDER — TAMSULOSIN HCL 0.4 MG PO CAPS: 0.4000 mg | ORAL_CAPSULE | Freq: Every day | ORAL | 1 refills | Status: DC

## 2019-11-10 MED ORDER — EMPAGLIFLOZIN 25 MG PO TABS
25.0000 mg | ORAL_TABLET | Freq: Every day | ORAL | 3 refills | Status: DC
Start: 2019-11-10 — End: 2020-02-16

## 2019-11-10 MED ORDER — GABAPENTIN 100 MG PO CAPS: 100.0000 mg | ORAL_CAPSULE | Freq: Every day | ORAL | 1 refills | Status: DC

## 2019-11-10 MED ORDER — HYDRALAZINE HCL 50 MG PO TABS: 50.0000 mg | ORAL_TABLET | Freq: Three times a day (TID) | ORAL | 1 refills | Status: DC

## 2019-11-10 MED ORDER — APIXABAN 5 MG PO TABS: ORAL_TABLET | ORAL | 3 refills | Status: DC

## 2019-11-10 MED ORDER — ATORVASTATIN CALCIUM 20 MG PO TABS: 20.0000 mg | ORAL_TABLET | Freq: Every day | ORAL | 1 refills | Status: DC

## 2019-11-10 MED ORDER — DULOXETINE HCL 60 MG PO CPEP: 60.0000 mg | ORAL_CAPSULE | Freq: Every day | ORAL | 1 refills | Status: DC

## 2019-11-10 MED ORDER — PANTOPRAZOLE SODIUM 40 MG PO TBEC: 40.0000 mg | DELAYED_RELEASE_TABLET | Freq: Every day | ORAL | 1 refills | Status: DC

## 2019-11-10 MED ORDER — GABAPENTIN 100 MG PO CAPS: 100.0000 mg | ORAL_CAPSULE | Freq: Two times a day (BID) | ORAL | 1 refills | Status: DC

## 2019-11-10 NOTE — Progress Notes (Signed)
BP 120/64 (BP Location: Left Arm, Cuff Size: Normal)   Pulse 94   Temp 97.9 F (36.6 C) (Oral)   SpO2 (!) 84%    Subjective:    Patient ID: Todd [name], male    DOB: 01-29-1931, 86 y.o.   MRN: [id number]  HPI: Todd [name] is a 86 y.o. male  Chief Complaint  Patient presents with  . Diabetes   Has a big oxygen concentrator at home, but don't know if it's putting out oxygen the way it should.   DIABETES Hypoglycemic episodes:no Polydipsia/polyuria: no Visual disturbance: no Chest pain: no Paresthesias: yes Glucose Monitoring: no  Accucheck frequency: occasionally Taking Insulin?: no Blood Pressure Monitoring: not checking Retinal Examination: Up to Date Foot Exam: Up to Date Diabetic Education: Completed Pneumovax: Up to Date Influenza: Up to Date Aspirin: yes   Relevant past medical, surgical, family and social history reviewed and updated as indicated. Interim medical history since our last visit reviewed. Allergies and medications reviewed and updated.  Review of Systems  Constitutional: Negative.   HENT: Negative.   Respiratory: Negative.   Cardiovascular: Negative.   Gastrointestinal: Negative.   Skin: Negative.   Neurological: Positive for speech difficulty, weakness and numbness. Negative for dizziness, tremors, seizures, syncope, facial asymmetry, light-headedness and headaches.  Psychiatric/Behavioral: Positive for dysphoric mood. Negative for agitation, behavioral problems, confusion, decreased concentration, hallucinations, self-injury, sleep disturbance and suicidal ideas. The patient is not nervous/anxious and is not hyperactive.     Per HPI unless specifically indicated above     Objective:    BP 120/64 (BP Location: Left Arm, Cuff Size: Normal)   Pulse 94   Temp 97.9 F (36.6 C) (Oral)   SpO2 (!) 84%   Wt Readings from Last 3 Encounters:  11/17/19 180 lb (81.6 kg)  10/16/19 174 lb (78.9 kg)  10/06/19 174 lb 6 oz (79.1 kg)    Physical  Exam Vitals and nursing note reviewed.  Constitutional:      General: He is not in acute distress.    Appearance: Normal appearance. He is not ill-appearing, toxic-appearing or diaphoretic.  HENT:     Head: Normocephalic and atraumatic.     Right Ear: External ear normal.     Left Ear: External ear normal.     Nose: Nose normal.     Mouth/Throat:     Mouth: Mucous membranes are moist.     Pharynx: Oropharynx is clear.  Eyes:     General: No scleral icterus.       Right eye: No discharge.        Left eye: No discharge.     Extraocular Movements: Extraocular movements intact.     Conjunctiva/sclera: Conjunctivae normal.     Pupils: Pupils are equal, round, and reactive to light.  Cardiovascular:     Rate and Rhythm: Normal rate and regular rhythm.     Pulses: Normal pulses.     Heart sounds: Normal heart sounds. No murmur heard.  No friction rub. No gallop.   Pulmonary:     Effort: Pulmonary effort is normal. No respiratory distress.     Breath sounds: Normal breath sounds. No stridor. No wheezing, rhonchi or rales.  Chest:     Chest wall: No tenderness.  Musculoskeletal:        General: Normal range of motion.     Cervical back: Normal range of motion and neck supple.  Skin:    General: Skin is warm and dry.     Capillary  Refill: Capillary refill takes less than 2 seconds.     Coloration: Skin is not jaundiced or pale.     Findings: No bruising, erythema, lesion or rash.  Neurological:     Mental Status: He is alert. Mental status is at baseline.     Sensory: Sensory deficit present.     Motor: Weakness present.     Comments: Aphasia, wheelchair bound  Psychiatric:        Mood and Affect: Mood normal.        Behavior: Behavior normal.        Thought Content: Thought content normal.        Judgment: Judgment normal.     Results for orders placed or performed in visit on 11/10/19  Bayer DCA Hb A1c Waived  Result Value Ref Range   HB A1C (BAYER DCA - WAIVED) 8.4 (H)  <7.0 %      Assessment & Plan:   Problem List Items Addressed This Visit      Endocrine   Type 2 diabetes mellitus with peripheral neuropathy ([place]) - Primary    Still not under good control with A1c of 8.4- we will add jardiance to avoid hypoglycemia and recheck in 3 months. Call with any concerns.       Relevant Medications   empagliflozin (JARDIANCE) 25 MG TABS tablet   DULoxetine (CYMBALTA) 60 MG capsule   atorvastatin (LIPITOR) 20 MG tablet   metFORMIN (GLUCOPHAGE) 500 MG tablet   gabapentin (NEURONTIN) 100 MG capsule   Other Relevant Orders   Bayer DCA Hb A1c Waived (Completed)       Follow up plan: Return in about 3 months (around 02/10/2020).

## 2019-11-11 ENCOUNTER — Telehealth: Payer: Self-pay | Admitting: Family Medicine

## 2019-11-11 ENCOUNTER — Telehealth: Payer: Self-pay

## 2019-11-11 NOTE — Telephone Encounter (Signed)
Called [employer], they are going to call pt and get everything set up.

## 2019-11-11 NOTE — Telephone Encounter (Signed)
-----   Message from Valerie Roys, DO sent at 11/10/2019  4:56 PM EDT ----- Please check with [employer] to see if they can check on his oxygen concetrator. It doesn't seem like it's putting out enough oxygen. He also needs his portable oxygen.

## 2019-11-11 NOTE — Telephone Encounter (Signed)
Copied from [place] [phone]. Topic: General - Other >> Nov 11, 2019  1:40 PM Wynetta Emery, [place] C wrote: Reason for CRM: pt's daughter [name] called in to discuss pt's diabetic medication. Daughter says that pt was put on added new mediation and it is really expensive. She would like to also discuss pt going back to taking Glipizide with his Metformin instead?   CB: [phone]

## 2019-11-12 NOTE — Telephone Encounter (Signed)
Pts daughter called to check the status of the answer about Glipizide / please advise

## 2019-11-14 ENCOUNTER — Ambulatory Visit (INDEPENDENT_AMBULATORY_CARE_PROVIDER_SITE_OTHER): Payer: PPO | Admitting: Pharmacist

## 2019-11-14 DIAGNOSIS — E1142 Type 2 diabetes mellitus with diabetic polyneuropathy: Secondary | ICD-10-CM | POA: Diagnosis not present

## 2019-11-14 NOTE — Telephone Encounter (Signed)
That medicine increases the risk of low sugars as discussed at his appointment and I'm concerned that it will increase his falls. I will consult CCM to see if they have recommendations for cost help

## 2019-11-14 NOTE — Patient Instructions (Signed)
Visit Information  Goals Addressed              This Visit's Progress     Patient Stated   .  PharmD "We have questions about his medications" (pt-stated)        CARE PLAN ENTRY (see longtitudinal plan of care for additional care plan information)  Current Barriers:  . Polypharmacy; complex patient with multiple comorbidities including CAD, CHF, Afib, CVA, T2DM, CKD o Daughter, [name], called clinic today report costs concerns w/ his Jardiance, and wondering about re-starting glipizide  . Son and daughter in law [name], [phone]) live with patient and [name] fills a pill box for him . Most recent eGFR: >60 o T2DM: last A1c 8.4%; metformin to 500 mg BID, Jardiance 25 mg daily recently added o CAD, HTN, CHF: [facility]; furosemide 20 mg daily, hydralazine 50 mg TID, propanolol LA 120 mg (also used to help tremor), digoxin 0.125 mg every other day o Afib: Eliquis 5 mg BID o ASCVD: atorvastatin 20 mg daily; last LDL at goal <70 o BPH: tamsulosin 0.4 mg daily o Suspected PD: carbidopa/levodopa 25/100 0.5 mg TID;  o Tremor/neuropathy: duloxetine 60 mg daily o GERD: pantoprazole 40 mg daily  Pharmacist Clinical Goal(s):  [name], patient will work with PharmD and provider towards optimized medication management  Interventions: . Called [place]. LVM to return my call.  Wyatt Haste. Discussed risk of hypoglycemia and falls risk w/ glipizide. She verbalizes understanding. Discussed that brenan modesto qualify for Patient Assistance Program for [place]. Sending referral to [facility] for support with this process. Encouraged [name] to give me a call back if they are unable to help her.   Patient Self Care Activities:  . Patient will take medications as prescribed  Please see past updates related to this goal by clicking on the "Past Updates" button in the selected goal         Patient verbalizes understanding of instructions provided today.      Plan:  - Will outreach for follow up as previously scheduled  Catie Darnelle Maffucci, PharmD, [place] [phone]

## 2019-11-14 NOTE — Chronic Care Management (AMB) (Signed)
Chronic Care Management   Follow Up Note   11/14/2019 Name: Todd [name] MRN: [id number] DOB: 02/28/1931  Referred by: Valerie Roys, DO Reason for referral : Chronic Care Management (Medication Management)   Todd [name] is a 86 y.o. year old male who is a primary care patient of Valerie Roys, DO. The CCM team was consulted for assistance with chronic disease management and care coordination needs.    Medication access support.   Review of patient status, including review of consultants reports, relevant laboratory and other test results, and collaboration with appropriate care team members and the patient's provider was performed as part of comprehensive patient evaluation and provision of chronic care management services.    SDOH (Social Determinants of Health) assessments performed: Yes See Care Plan activities for detailed interventions related to SDOH)  SDOH Interventions     Most Recent Value  SDOH Interventions  Financial Strain Interventions Other (Comment)  [patient assistance referral]       Outpatient Encounter Medications as of 11/14/2019  Medication Sig Note  . acetaminophen (TYLENOL 8 HOUR ARTHRITIS PAIN) 650 MG CR tablet Take 650 mg by mouth every 8 (eight) hours as needed for pain.   [name] Kitchen apixaban (ELIQUIS) 5 MG TABS tablet Take 1 tablet (5 mg total) by mouth 2 (two) times daily.   [name] Kitchen atorvastatin (LIPITOR) 20 MG tablet Take 1 tablet (20 mg total) by mouth daily.   . blood glucose meter kit and supplies KIT Dispense based on patient and insurance preference. Use up to four times daily as directed. (FOR ICD-9 250.00, 250.01).   . Blood Glucose Monitoring Suppl (ACCU-CHEK AVIVA PLUS) w/Device KIT Use to check sugar levels x 2 daily   . carbidopa-levodopa (SINEMET IR) 25-100 MG tablet Take 0.5 tablets by mouth 3 (three) times daily.    . digoxin (LANOXIN) 0.125 MG tablet Take 1 tablet (0.125 mg total) by mouth every other day.   . docusate sodium (COLACE) 100 MG  capsule Take 1 capsule (100 mg total) by mouth 2 (two) times daily as needed for mild constipation.   . DULoxetine (CYMBALTA) 60 MG capsule Take 1 capsule (60 mg total) by mouth daily.   . empagliflozin (JARDIANCE) 25 MG TABS tablet Take 1 tablet (25 mg total) by mouth daily before breakfast.   . furosemide (LASIX) 20 MG tablet Take 1.5 tablets (30 mg) by mouth once daily (Patient taking differently: 20 mg. Take 1.5 tablets (30 mg) by mouth once daily)   . gabapentin (NEURONTIN) 100 MG capsule Take 1 capsule (100 mg total) by mouth at bedtime.   . hydrALAZINE (APRESOLINE) 50 MG tablet Take 1 tablet (50 mg total) by mouth 3 (three) times daily.   . Lancets (ACCU-CHEK SOFT TOUCH) lancets Use as instructed   . latanoprost (XALATAN) 0.005 % ophthalmic solution Place 1 drop into both eyes at bedtime.    . Magnesium Oxide 400 (240 Mg) MG TABS Take 1 tablet (400 mg total) by mouth 2 (two) times daily.   . metFORMIN (GLUCOPHAGE) 500 MG tablet Take 1 tablet (500 mg total) by mouth 2 (two) times daily with a meal.   . naproxen sodium (ALEVE) 220 MG tablet Take 220 mg by mouth 2 (two) times daily as needed.  08/07/2017: Patient reports taking at 2 to 3 tablets a day.   [name] ULTRA test strip USE TO CHECK SUGAR LEVELS TWICE DAILY.   . pantoprazole (PROTONIX) 40 MG tablet Take 1 tablet (40 mg total) by mouth daily.   [name] Kitchen  propranolol ER (INDERAL LA) 120 MG 24 hr capsule Take 1 capsule (120 mg total) by mouth daily.   . tamsulosin (FLOMAX) 0.4 MG CAPS capsule Take 1 capsule (0.4 mg total) by mouth daily.    Facility-Administered Encounter Medications as of 11/14/2019  Medication  . cyanocobalamin ((VITAMIN B-12)) injection 1,000 mcg     Objective:   Goals Addressed              This Visit's Progress     Patient Stated   .  PharmD "We have questions about his medications" (pt-stated)        CARE PLAN ENTRY (see longtitudinal plan of care for additional care plan information)  Current Barriers:    . Polypharmacy; complex patient with multiple comorbidities including CAD, CHF, Afib, CVA, T2DM, CKD o Daughter, [name], called clinic today report costs concerns w/ his Jardiance, and wondering about re-starting glipizide  . Son and daughter in law [name], [phone]) live with patient and [name] fills a pill box for him . Most recent eGFR: >60 o T2DM: last A1c 8.4%; metformin to 500 mg BID, Jardiance 25 mg daily recently added o CAD, HTN, CHF: follows w/ [facility]; furosemide 20 mg daily, hydralazine 50 mg TID, propanolol LA 120 mg (also used to help tremor), digoxin 0.125 mg every other day o Afib: Eliquis 5 mg BID o ASCVD: atorvastatin 20 mg daily; last LDL at goal <70 o BPH: tamsulosin 0.4 mg daily o Suspected PD: carbidopa/levodopa 25/100 0.5 mg TID;  o Tremor/neuropathy: duloxetine 60 mg daily o GERD: pantoprazole 40 mg daily  Pharmacist Clinical Goal(s):  [name], patient will work with PharmD and provider towards optimized medication management  Interventions: . Called [place]. LVM to return my call.  Wyatt Haste. Discussed risk of hypoglycemia and falls risk w/ glipizide. She verbalizes understanding. Discussed that traye bates qualify for Patient Assistance Program for [place]. Sending referral to [facility] for support with this process. Encouraged [name] to give me a call back if they are unable to help her.   Patient Self Care Activities:  . Patient will take medications as prescribed  Please see past updates related to this goal by clicking on the "Past Updates" button in the selected goal          Plan:  - Will outreach for follow up as previously scheduled  Catie Darnelle Maffucci, PharmD, [place] [phone]

## 2019-11-14 NOTE — Telephone Encounter (Signed)
Will call the daughter to discuss medication access needs

## 2019-11-17 ENCOUNTER — Encounter: Payer: Self-pay | Admitting: Urology

## 2019-11-17 ENCOUNTER — Other Ambulatory Visit: Payer: Self-pay

## 2019-11-17 ENCOUNTER — Ambulatory Visit (INDEPENDENT_AMBULATORY_CARE_PROVIDER_SITE_OTHER): Payer: PPO | Admitting: Urology

## 2019-11-17 ENCOUNTER — Telehealth: Payer: Self-pay | Admitting: *Deleted

## 2019-11-17 VITALS — BP 112/73 | HR 73 | Ht 68.0 in | Wt 180.0 lb

## 2019-11-17 DIAGNOSIS — C61 Malignant neoplasm of prostate: Secondary | ICD-10-CM

## 2019-11-17 NOTE — Telephone Encounter (Signed)
No prior off needed 11/17/2019 per [facility]

## 2019-11-17 NOTE — Progress Notes (Signed)
PATIENT ID: Todd [name], male     DOB: 1930/08/13, 86 y.o.     MRN: [id number]   ENCOUNTER: 11/18/19, 6:45 PM     REFERRING PROVIDER: Valerie Roys, DO [place],  [place] [address]  Chief Complaint  Patient presents with  . Prostate Cancer    Urologic history: 1.  T1c prostate cancer -Diagnosed 2002 -Primary treatment IMRT/brachytherapy -ADT was started for biochemical recurrence sometime prior to 2013 -Was initially on intermittent ablation and subsequently changed to continuous  2.  Lower urinary tract symptoms -Primarily storage related voiding symptoms -Cystoscopy 04/2015 without stricture or mucosal abnormalities  HPI: Todd [name] is a 86 y.o. male here today for follow up of his prostate cancer.    He declines any urinary issues.   He denies abdominal or bone pain  No dysuria, gross hematuria  He presents today with his daughter   PMHx: Past Medical History:  Diagnosis Date  . Anxiety   . Aortic regurgitation    a. 03/2017 Echo: Mod AI; b. 05/2017 Echo: Triv AI.  [name] Kitchen Arthritis    osteoarthritis-left knee  . CAD (coronary artery disease)    a. Reported h/o cath ~ 2008 - ? small vessel dzs. No PCI performed; b. 03/2017 MV: EF 45-54%, no ischemia/infarct.  . Depression   . Diabetes mellitus without complication ([place])   . Diastolic dysfunction    a. 03/2017 Echo: EF 55-65%; b. 05/2017 Echo: EF 55-60%, no rwma, Gr1 DD. Triv AI.  Mildly dil LA.  . ED (erectile dysfunction)   . Elevated troponin    a. chronic mild elevation - 0.03->0.04.  [name] Kitchen GERD (gastroesophageal reflux disease)   . Glaucoma   . Hyperlipidemia   . Hypertension   . Iron deficiency anemia due to chronic blood loss   . Left pontine stroke w/ cerebrovascular disease(HCC)    a. 05/2017 MRI/A: acute to subacute L pontine infarct. Chronic left thalamic lacunar infarct. Severe basilar artery stenosis w/ radiographic string sign of the distal 1/3. Mild to moderate L PCA P2 segment stenosis;  b. 05/2017 Carotid U/S: <50% bilat ICA stenosis.  . Lumbago    Lumbosacral Neuritis  . Prostate cancer [facility])     SURGICAL HISTORY: Past Surgical History:  Procedure Laterality Date  . CARPAL TUNNEL RELEASE    . REPLACEMENT TOTAL KNEE BILATERAL      HOME MEDICATIONS:  Allergies as of 11/17/2019      Reactions   Amlodipine    Lower extremity swelling at 10 mg.   Fluoxetine Other (See Comments)   headache   Meloxicam Other (See Comments)   Other reaction(s): Dizziness Nervousness.      Medication List       Accurate as of November 17, 2019 11:59 PM. If you have any questions, ask your nurse or doctor.        Accu-Chek Aviva Plus w/Device Kit Use to check sugar levels x 2 daily   accu-chek soft touch lancets Use as instructed   apixaban 5 MG Tabs tablet Commonly known as: Eliquis Take 1 tablet (5 mg total) by mouth 2 (two) times daily.   atorvastatin 20 MG tablet Commonly known as: LIPITOR Take 1 tablet (20 mg total) by mouth daily.   blood glucose meter kit and supplies Kit Dispense based on patient and insurance preference. Use up to four times daily as directed. (FOR ICD-9 250.00, 250.01).   carbidopa-levodopa 25-100 MG tablet Commonly known as: SINEMET IR Take 0.5 tablets by  mouth 3 (three) times daily.   digoxin 0.125 MG tablet Commonly known as: LANOXIN Take 1 tablet (0.125 mg total) by mouth every other day.   docusate sodium 100 MG capsule Commonly known as: Colace Take 1 capsule (100 mg total) by mouth 2 (two) times daily as needed for mild constipation.   DULoxetine 60 MG capsule Commonly known as: CYMBALTA Take 1 capsule (60 mg total) by mouth daily.   empagliflozin 25 MG Tabs tablet Commonly known as: Jardiance Take 1 tablet (25 mg total) by mouth daily before breakfast.   furosemide 20 MG tablet Commonly known as: LASIX Take 1.5 tablets (30 mg) by mouth once daily What changed: how much to take   gabapentin 100 MG capsule Commonly known  as: NEURONTIN Take 1 capsule (100 mg total) by mouth at bedtime.   hydrALAZINE 50 MG tablet Commonly known as: APRESOLINE Take 1 tablet (50 mg total) by mouth 3 (three) times daily.   latanoprost 0.005 % ophthalmic solution Commonly known as: XALATAN Place 1 drop into both eyes at bedtime.   Magnesium Oxide 400 (240 Mg) MG Tabs Take 1 tablet (400 mg total) by mouth 2 (two) times daily.   metFORMIN 500 MG tablet Commonly known as: GLUCOPHAGE Take 1 tablet (500 mg total) by mouth 2 (two) times daily with a meal.   naproxen sodium 220 MG tablet Commonly known as: ALEVE Take 220 mg by mouth 2 (two) times daily as needed.   OneTouch Ultra test strip Generic drug: glucose blood USE TO CHECK SUGAR LEVELS TWICE DAILY.   pantoprazole 40 MG tablet Commonly known as: PROTONIX Take 1 tablet (40 mg total) by mouth daily.   propranolol ER 120 MG 24 hr capsule Commonly known as: INDERAL LA Take 1 capsule (120 mg total) by mouth daily.   tamsulosin 0.4 MG Caps capsule Commonly known as: FLOMAX Take 1 capsule (0.4 mg total) by mouth daily.   Tylenol 8 Hour Arthritis Pain 650 MG CR tablet Generic drug: acetaminophen Take 650 mg by mouth every 8 (eight) hours as needed for pain.       ALLERGIES: Allergies  Allergen Reactions  . Amlodipine     Lower extremity swelling at 10 mg.  . Fluoxetine Other (See Comments)    headache  . Meloxicam Other (See Comments)    Other reaction(s): Dizziness Nervousness.     FAMILY HISTORY: Family History  Problem Relation Age of Onset  . Stroke Mother   . Hypertension Mother   . Hypertension Father   . Stroke Sister   . Prostate cancer Neg Hx   . Kidney cancer Neg Hx   . Bladder Cancer Neg Hx     SOCIAL HISTORY:  reports that he quit smoking about 24 years ago. He has never used smokeless tobacco. He reports current alcohol use of about 1.0 standard drink of alcohol per week. He reports that he does not use drugs.  PHYSICAL  EXAM: BP 112/73   Pulse 73   Ht 5' 8"  (1.727 m)   Wt 180 lb (81.6 kg)   BMI 27.37 kg/m   Constitutional:  Alert.  No acute distress. HEENT: [place] AT, moist mucus membranes.  Trachea midline, no masses. Cardiovascular: No clubbing, cyanosis, or edema. Respiratory: Normal respiratory effort, no increased work of breathing.    ASSESSMENT/PLAN:   1.  T1c prostate cancer  Diagnosed 2002  Primary treatment IMRT/brachytherapy  Biochemical recurrence  PSA ordered today.  He has been stable and was previously on intermittent  ablation.  His daughter would like to hold off on leuprolide today and will return for leuprolide if PSA increasing.  Her father was in agreement.  If PSA stable he will follow-up in 6 months with PSA prior and possible leuprolide injection   Abbie Sons, MD  [facility] [place] [address], [place], [place] [address] [phone]  By signing my name below, I, [employer], attest that this documentation has been prepared under the direction and in the presence of John Giovanni, MD. Electronically Signed: Abbie Sons, MD 11/18/19, 6:45 PM   I have reviewed the above documentation for accuracy and completeness, and I agree with the above.   Abbie Sons, MD

## 2019-11-18 ENCOUNTER — Telehealth: Payer: Self-pay | Admitting: *Deleted

## 2019-11-18 ENCOUNTER — Ambulatory Visit (INDEPENDENT_AMBULATORY_CARE_PROVIDER_SITE_OTHER): Payer: PPO

## 2019-11-18 DIAGNOSIS — I5020 Unspecified systolic (congestive) heart failure: Secondary | ICD-10-CM

## 2019-11-18 DIAGNOSIS — R0602 Shortness of breath: Secondary | ICD-10-CM

## 2019-11-18 LAB — PSA: Prostate Specific Ag, Serum: 3 ng/mL (ref 0.0–4.0)

## 2019-11-18 MED ORDER — PERFLUTREN LIPID MICROSPHERE
1.0000 mL | INTRAVENOUS | Status: AC | PRN
  Administered 2019-11-18: 2 mL via INTRAVENOUS

## 2019-11-18 NOTE — Telephone Encounter (Signed)
-----   Message from Abbie Sons, MD sent at 11/18/2019  1:58 PM EDT ----- PSA stable at 3.0.  Please schedule follow-up 6 months with possible leuprolide injection.  PSA prior to that visit

## 2019-11-18 NOTE — Telephone Encounter (Signed)
Notified patient as instructed, patient pleased. Discussed follow-up appointments, patient agrees  

## 2019-11-19 ENCOUNTER — Encounter: Payer: Self-pay | Admitting: Family Medicine

## 2019-11-19 NOTE — Assessment & Plan Note (Signed)
Still not under good control with A1c of 8.4- we will add jardiance to avoid hypoglycemia and recheck in 3 months. Call with any concerns.

## 2019-11-20 ENCOUNTER — Encounter: Payer: Self-pay | Admitting: Urology

## 2019-11-25 ENCOUNTER — Encounter: Payer: Self-pay | Admitting: Family

## 2019-11-25 ENCOUNTER — Ambulatory Visit (INDEPENDENT_AMBULATORY_CARE_PROVIDER_SITE_OTHER): Payer: PPO | Admitting: Family

## 2019-11-25 ENCOUNTER — Other Ambulatory Visit: Payer: Self-pay

## 2019-11-25 VITALS — BP 138/64 | HR 71 | Ht 68.0 in | Wt 169.2 lb

## 2019-11-25 DIAGNOSIS — Z79899 Other long term (current) drug therapy: Secondary | ICD-10-CM | POA: Diagnosis not present

## 2019-11-25 DIAGNOSIS — E119 Type 2 diabetes mellitus without complications: Secondary | ICD-10-CM

## 2019-11-25 DIAGNOSIS — I10 Essential (primary) hypertension: Secondary | ICD-10-CM

## 2019-11-25 DIAGNOSIS — I480 Paroxysmal atrial fibrillation: Secondary | ICD-10-CM

## 2019-11-25 DIAGNOSIS — I2510 Atherosclerotic heart disease of native coronary artery without angina pectoris: Secondary | ICD-10-CM

## 2019-11-25 DIAGNOSIS — I5020 Unspecified systolic (congestive) heart failure: Secondary | ICD-10-CM | POA: Diagnosis not present

## 2019-11-25 DIAGNOSIS — Z7901 Long term (current) use of anticoagulants: Secondary | ICD-10-CM

## 2019-11-25 NOTE — Progress Notes (Signed)
Office Visit    Patient Name: Todd [name] Date of Encounter: 11/25/2019  Primary Care Provider:  Valerie Roys, DO Primary Cardiologist:  Ida Rogue, MD Electrophysiologist:  None   Chief Complaint    Todd [name] is a 86 y.o. 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 Anxiety   . Aortic regurgitation    a. 03/2017 Echo: Mod AI; b. 05/2017 Echo: Triv AI.  [name] Kitchen Arthritis    osteoarthritis-left knee  . CAD (coronary artery disease)    a. Reported h/o cath ~ 2008 - ? small vessel dzs. No PCI performed; b. 03/2017 MV: EF 45-54%, no ischemia/infarct.  . Depression   . Diabetes mellitus without complication ([place])   . Diastolic dysfunction    a. 03/2017 Echo: EF 55-65%; b. 05/2017 Echo: EF 55-60%, no rwma, Gr1 DD. Triv AI.  Mildly dil LA.  . ED (erectile dysfunction)   . Elevated troponin    a. chronic mild elevation - 0.03->0.04.  [name] Kitchen GERD (gastroesophageal reflux disease)   . Glaucoma   . Hyperlipidemia   . Hypertension   . Iron deficiency anemia due to chronic blood loss   . Left pontine stroke w/ cerebrovascular disease(HCC)    a. 05/2017 MRI/A: acute to subacute L pontine infarct. Chronic left thalamic lacunar infarct. Severe basilar artery stenosis w/ radiographic string sign of the distal 1/3. Mild to moderate L PCA P2 segment stenosis; b. 05/2017 Carotid U/S: <50% bilat ICA stenosis.  . Lumbago    Lumbosacral Neuritis  . Prostate cancer [facility])    Past Surgical History:  Procedure Laterality Date  . CARPAL TUNNEL RELEASE    . REPLACEMENT TOTAL KNEE BILATERAL      Allergies  Allergies    Allergen Reactions  . Amlodipine     Lower extremity swelling at 10 mg.  . Fluoxetine Other (See Comments)    headache  . Meloxicam Other (See Comments)    Other reaction(s): Dizziness Nervousness.     History of Present Illness    Todd [name] is a 86 y.o. 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  He was last seen 10/05/2019 by Elenor Quinones, PA.  Admitted 05/2009 with CVA and MRI brain showing acute to subacute left pontine infarct as well as chronic left bundle lacunar infarct.  Echo at that time with LVEF 55 to 16%, grade 1 diastolic dysfunction, trivial AI, mild LAE.  Carotid duplex with 1-39% stenosis bilateral. Per family rest he is previously been transitioned from Toprol back to extended release propanolol 120 mg daily 9 for type with his tremor.  Family has reported fatigue despite rate controlled atrial fibrillation/flutter but he is declined DCCV.  At last clinic visit his Lasix was increased to 30 mg every morning.  Recommended for updated echocardiogram.  Echo 11/18/2019 LVEF 50 to 65%, no regional wall motion normalities, moderate LVH, indeterminate diastolic parameters, RV normal size and function, normal PASP, LA moderately dilated, mild  MR, dilation of ascending aorta 39 mm  Weights in recent clinic visits 09/19/19 (187lb) -> 10/06/19 (174lb) -> today (169lb). Present today with daughter.  History primarily obtained with assistance of daughter due to residual deficits from CVA.  Reports improvement in his shortness of breath since increased diuretic dose.  Reports enlarged edema.  Reports no chest pain, pressure, tightness.  Has no concerns related to his cardiac health today.  We reviewed echocardiogram results in  detail.  EKGs/Labs/Other Studies Reviewed:   The following studies were reviewed today:  Echo 11/18/19  1. Left ventricular ejection fraction, by estimation, is 50 to 55%. The  left ventricle has low normal function. The left ventricle has no regional  wall motion abnormalities. There is moderate left ventricular hypertrophy.  Left ventricular diastolic  parameters are indeterminate.   2. Right ventricular systolic function is normal. The right ventricular  size is normal. There is normal pulmonary artery systolic pressure.   3. Left atrial size was moderately dilated.   4. Mild mitral valve regurgitation.   5. There is dilatation of the ascending aorta measuring 39 mm.   ZIO monitor 05/2018 Normal sinus rhythm Avg HR of 68 bpm.   1639 atrial tachycardia/Supraventricular Tachycardia runs occurred,  The run with the fastest interval lasting 5 beats with a max rate of 179 bpm,  the longest lasting 13 beats with an avg rate of 104 bpm.    Isolated SVEs were occasional (2.5%, 9549), SVE Couplets were occasional (2.2%, 4164), and SVE Triplets were rare (<1.0%, 613).  Isolated VEs were frequent (8.2%, 31242), VE Couplets were rare (<1.0%, 154), and no VE Triplets were present.  Ventricular Bigeminy and Trigeminy were present.    EKG:  EKG is ordered today.  The ekg ordered today demonstrates sinus rhythm 71 bpm with sinus arrhythmia and first-degree AV block (PR 216) overall stable compared to previous.  Recent Labs: 12/12/2018: TSH 1.400 07/21/2019: ALT 9 09/19/2019: Hemoglobin 10.4; Platelets 236 10/20/2019: BUN 17; Creatinine, Ser 1.09; Potassium 4.7; Sodium 135  Recent Lipid Panel    Component Value Date/Time   CHOL 120 07/21/2019 1605   CHOL 156 09/26/2017 1540   TRIG 143 07/21/2019 1605   TRIG 156 (H) 09/26/2017 1540   HDL 34 (L) 07/21/2019 1605   CHOLHDL 3.4 05/18/2017 0407   VLDL 31 (H) 09/26/2017 1540   LDLCALC 61 07/21/2019 1605    Home Medications   Current Meds   Medication Sig  . acetaminophen (TYLENOL 8 HOUR ARTHRITIS PAIN) 650 MG CR tablet Take 650 mg by mouth every 8 (eight) hours as needed for pain.  [name] Kitchen apixaban (ELIQUIS) 5 MG TABS tablet Take 1 tablet (5 mg total) by mouth 2 (two) times daily.  [name] Kitchen atorvastatin (LIPITOR) 20 MG tablet Take 1 tablet (20 mg total) by mouth daily.  . blood glucose meter kit and supplies KIT Dispense based on patient and insurance preference. Use up to four times daily as directed. (FOR ICD-9 250.00, 250.01).  . Blood Glucose Monitoring Suppl (ACCU-CHEK AVIVA PLUS) w/Device KIT Use to check sugar levels x 2 daily  . carbidopa-levodopa (SINEMET IR) 25-100 MG tablet Take 0.5 tablets by mouth 3 (three) times daily.   . digoxin (LANOXIN) 0.125 MG tablet Take 1 tablet (0.125 mg total) by mouth every other day.  . docusate sodium (COLACE) 100 MG capsule Take 1 capsule (100 mg total) by mouth 2 (two) times daily as needed for mild constipation.  . DULoxetine (CYMBALTA) 60 MG capsule Take 1 capsule (  60 mg total) by mouth daily.  . empagliflozin (JARDIANCE) 25 MG TABS tablet Take 1 tablet (25 mg total) by mouth daily before breakfast.  . furosemide (LASIX) 20 MG tablet Take 1.5 tablets (30 mg) by mouth once daily (Patient taking differently: 20 mg. Take 1.5 tablets (30 mg) by mouth once daily)  . gabapentin (NEURONTIN) 100 MG capsule Take 1 capsule (100 mg total) by mouth at bedtime.  . hydrALAZINE (APRESOLINE) 50 MG tablet Take 1 tablet (50 mg total) by mouth 3 (three) times daily.  . Lancets (ACCU-CHEK SOFT TOUCH) lancets Use as instructed  . latanoprost (XALATAN) 0.005 % ophthalmic solution Place 1 drop into both eyes at bedtime.   . Magnesium Oxide 400 (240 Mg) MG TABS Take 1 tablet (400 mg total) by mouth 2 (two) times daily.  . metFORMIN (GLUCOPHAGE) 500 MG tablet Take 1 tablet (500 mg total) by mouth 2 (two) times daily with a meal.  . naproxen sodium (ALEVE) 220 MG tablet Take 220 mg by mouth 2 (two) times daily as needed.    [name] ULTRA test strip USE TO CHECK SUGAR LEVELS TWICE DAILY.  . pantoprazole (PROTONIX) 40 MG tablet Take 1 tablet (40 mg total) by mouth daily.  . propranolol ER (INDERAL LA) 120 MG 24 hr capsule Take 1 capsule (120 mg total) by mouth daily.  . tamsulosin (FLOMAX) 0.4 MG CAPS capsule Take 1 capsule (0.4 mg total) by mouth daily.   Current Facility-Administered Medications for the 11/25/19 encounter (Office Visit) with Loel Dubonnet, NP  Medication  . cyanocobalamin ((VITAMIN B-12)) injection 1,000 mcg      Review of Systems       Review of Systems  Constitutional: Negative for chills, fever and malaise/fatigue.  Cardiovascular: Negative for chest pain, dyspnea on exertion, irregular heartbeat, leg swelling, near-syncope, orthopnea, palpitations and syncope.  Respiratory: Negative for cough, shortness of breath and wheezing.   Gastrointestinal: Negative for melena, nausea and vomiting.  Genitourinary: Negative for hematuria.  Neurological: Negative for dizziness, light-headedness and weakness.   All other systems reviewed and are otherwise negative except as noted above.  Physical Exam    VS:  BP 138/64 (BP Location: Left Arm, Patient Position: Sitting, Cuff Size: Normal)   Pulse 71   Ht '5\' 8"'$  (1.727 m)   Wt 169 lb 4 oz (76.8 kg)   SpO2 92%   BMI 25.73 kg/m  , BMI Body mass index is 25.73 kg/m. GEN: Well nourished, well developed, in no acute distress. HEENT: normal. Neck: Supple, no JVD, carotid bruits, or masses. Cardiac: RRR, no murmurs, rubs, or gallops. No clubbing, cyanosis, edema.  Radials/DP/PT 2+ and equal bilaterally.  Respiratory:  Respirations regular and unlabored, clear to auscultation bilaterally. GI: Soft, nontender, nondistended, BS + x 4. MS: No deformity or atrophy. Skin: Warm and dry, no rash. Neuro:  Strength and sensation are intact.  Noted cognitive impairment secondary to CVA. Psych: Normal affect.  Assessment & Plan     1. HFrEF/pulmonary hypertension -Echo 6 was 2021 LVEF 40 to 45%.  Echo 11/2019 with LVEF 50 to 55%.  Reviewed echocardiogram with him and his daughter.  LVEF improved to normal.  Euvolemic and well compensated on exam.  Continue Lasix 30 mg daily.  Continue digoxin 0.125 mg every other day.  Continue hydralazine 50 mg 3 times daily.  Continue propanolol 120 mg daily.  2. Atrial fibrillation flutter -history of atrial fibrillation versus atrial flutter with variable block.  EKG today shows maintaining sinus  rhythm with PAC.  CHA2DS2-VASc release 8 (hypertension, agex2, DM2, CVAx2, vascular).  Continue Eliquis 5 mg twice daily.  Denies bleeding complications.  Does not qualify for reduced dose.  Continue Propranolol.  3. High risk medication use -digoxin secondary to atrial fibrillation flutter and HFrEF. Digoxin level 10/21/19 1.2. Digoxin dose reduced to 0.135m QOD.  No signs of toxicity.  4. Mild dilation ascending aorta -39 mm by echo 6/20 from.  Recommend repeat imaging by CT or echo in 1 year. Continue optimal BP control.   5. HTN -BP mildly elevated in clinic today but well controlled at home.  Continue hydralazine 50 mg 3 times daily.  6. CAD - Remote cath 2008 with normal MPI 2018.  No anginal symptoms.  No indication for ischemic evaluation.  GDMT includes beta-blocker, statin.  No aspirin secondary to chronic anticoagulation.  7. CVA -follows with neurology.  Continue OAC and statin therapy.  8. Anemia - 09/19/19 Hb 10.4, stable at baseline.   9. HLD, LDL goal of 70 -07/16/2019 LDL 61.  Continue Atorvastatin 21mdaily.  10. DM2 - 11/10/19 A1x 8.4. Appreciate inclusion of SGLT2i. Continue to follow with primary care.   Disposition: Follow up in 3-4 month(s) with Dr. GoWallie RenshawNP 11/25/2019, 4:11 PM

## 2019-11-25 NOTE — Patient Instructions (Addendum)
Medication Instructions:  Your physician recommends that you continue on your current medications as directed. Please refer to the Current Medication list given to you today.  *If you need a refill on your cardiac medications before your next appointment, please call your pharmacy*   Lab Work: none ordered  If you have labs (blood work) drawn today and your tests are completely normal, you will receive your results only by: [name] Kitchen MyChart Message (if you have MyChart) OR . A paper copy in the mail If you have any lab test that is abnormal or we need to change your treatment, we will call you to review the results.   Testing/Procedures: none ordered    Follow-Up: At [facility], you and your health needs are our priority.  As part of our continuing mission to provide you with exceptional heart care, we have created designated Provider Care Teams.  These Care Teams include your primary Cardiologist (physician) and Advanced Practice Providers (APPs -  Physician Assistants and Nurse Practitioners) who all work together to provide you with the care you need, when you need it.  We recommend signing up for the patient portal called "MyChart".  Sign up information is provided on this After Visit Summary.  MyChart is used to connect with patients for Virtual Visits (Telemedicine).  Patients are able to view lab/test results, encounter notes, upcoming appointments, etc.  Non-urgent messages can be sent to your provider as well.   To learn more about what you can do with MyChart, go to [email or web address].    Your next appointment: 3-4 months preferably with Dr. Rockey Situ

## 2019-12-03 ENCOUNTER — Telehealth: Payer: Self-pay

## 2019-12-03 ENCOUNTER — Ambulatory Visit: Payer: Self-pay | Admitting: Pharmacist

## 2019-12-03 DIAGNOSIS — G4700 Insomnia, unspecified: Secondary | ICD-10-CM | POA: Diagnosis not present

## 2019-12-03 DIAGNOSIS — E785 Hyperlipidemia, unspecified: Secondary | ICD-10-CM | POA: Diagnosis not present

## 2019-12-03 DIAGNOSIS — I2510 Atherosclerotic heart disease of native coronary artery without angina pectoris: Secondary | ICD-10-CM | POA: Diagnosis not present

## 2019-12-03 DIAGNOSIS — D869 Sarcoidosis, unspecified: Secondary | ICD-10-CM | POA: Diagnosis not present

## 2019-12-03 DIAGNOSIS — E119 Type 2 diabetes mellitus without complications: Secondary | ICD-10-CM | POA: Diagnosis not present

## 2019-12-03 DIAGNOSIS — I69359 Hemiplegia and hemiparesis following cerebral infarction affecting unspecified side: Secondary | ICD-10-CM | POA: Diagnosis not present

## 2019-12-03 DIAGNOSIS — K219 Gastro-esophageal reflux disease without esophagitis: Secondary | ICD-10-CM | POA: Diagnosis not present

## 2019-12-03 DIAGNOSIS — Z86711 Personal history of pulmonary embolism: Secondary | ICD-10-CM | POA: Diagnosis not present

## 2019-12-03 NOTE — Chronic Care Management (AMB) (Signed)
  Chronic Care Management   Note  12/03/2019 Name: Todd [name] MRN: [id number] DOB: April 13, 1931  [name] is a 86 y.o. year old male who is a primary care patient of Valerie Roys, DO. The CCM team was consulted for assistance with chronic disease management and care coordination needs.    Received message from [facility] team that they do not have patient's daughter, [name], on file to be able to talk to her about patient's meds. They have called patient's home number x2 and have not connected.   Contacted [name], LVM asking her to contact [facility] while with the patient and determine process for updating contact information and adding herself to patient's approved contact list. Asked her to return my call.   Catie Darnelle Maffucci, PharmD, [place] [phone]

## 2019-12-09 ENCOUNTER — Ambulatory Visit: Payer: Self-pay | Admitting: Pharmacist

## 2019-12-09 NOTE — Chronic Care Management (AMB) (Signed)
  Chronic Care Management   Note  12/09/2019 Name: Todd [name] MRN: [id number] DOB: Nov 30, 1930  Todd [name] is a 86 y.o. year old male who is a primary care patient of Valerie Roys, DO. The CCM team was consulted for assistance with chronic disease management and care coordination needs.    Attempted to contact Gretta regarding need for her to call [employer] to update their information allowing them to speak with her about patient's medications. LVM asking her to return my call at her convenience.   Catie Darnelle Maffucci, PharmD, [place] [phone]

## 2019-12-09 NOTE — Chronic Care Management (AMB) (Signed)
  Chronic Care Management   Note  12/09/2019 Name: Todd [name] MRN: [id number] DOB: Jun 08, 1930  [name] is a 86 y.o. year old male who is a primary care patient of Valerie Roys, DO. The CCM team was consulted for assistance with chronic disease management and care coordination needs.    Received call back from [place]. Reviewed that she needs to call [employer] with patient to determine what is needed for the release paperwork, then ask to speak to someone on the Pharmacy team about medication assistance. Gretta verbalized understanding.    Catie Darnelle Maffucci, PharmD, [place] [phone]

## 2019-12-10 ENCOUNTER — Telehealth: Payer: Self-pay | Admitting: Family Medicine

## 2019-12-10 DIAGNOSIS — I2510 Atherosclerotic heart disease of native coronary artery without angina pectoris: Secondary | ICD-10-CM | POA: Diagnosis not present

## 2019-12-10 DIAGNOSIS — R0902 Hypoxemia: Secondary | ICD-10-CM | POA: Diagnosis not present

## 2019-12-10 DIAGNOSIS — I501 Left ventricular failure: Secondary | ICD-10-CM | POA: Diagnosis not present

## 2019-12-10 NOTE — Telephone Encounter (Signed)
Called and spoke with patient's daughter. Message relayed. Scheduled for 12/15/19 at 3:20 pm

## 2019-12-10 NOTE — Telephone Encounter (Signed)
Does patient need an appointment for this? Please advise

## 2019-12-10 NOTE — Telephone Encounter (Signed)
Copied from [place] [id number]. Topic: General - Other >> Dec 10, 2019  1:28 PM Celene Kras wrote: Reason for CRM: Pts daughter called and is requesting to have orders placed for the pt to receive a b12 shot. Please advise.

## 2019-12-10 NOTE — Telephone Encounter (Signed)
It looks like he did receive Vitamin B12 injections in the past, however a B12 level has not been checked in over 1 year.  I would recommend an appointment before we start injections again.

## 2019-12-15 ENCOUNTER — Other Ambulatory Visit: Payer: Self-pay

## 2019-12-15 ENCOUNTER — Telehealth: Payer: Self-pay

## 2019-12-15 ENCOUNTER — Other Ambulatory Visit (INDEPENDENT_AMBULATORY_CARE_PROVIDER_SITE_OTHER): Payer: PPO

## 2019-12-15 ENCOUNTER — Ambulatory Visit: Payer: PPO

## 2019-12-15 DIAGNOSIS — D649 Anemia, unspecified: Secondary | ICD-10-CM

## 2019-12-15 DIAGNOSIS — E538 Deficiency of other specified B group vitamins: Secondary | ICD-10-CM | POA: Diagnosis not present

## 2019-12-15 NOTE — Telephone Encounter (Signed)
Encounter was open to review B12 orders

## 2019-12-16 LAB — B12 AND FOLATE PANEL
Folate: 9.2 ng/mL (ref >3.0)
Vitamin B-12: 961 pg/mL (ref 232–1245)

## 2019-12-16 LAB — IRON AND TIBC
Iron Saturation: 24 % (ref 15–55)
Iron: 62 ug/dL (ref 38–169)
Total Iron Binding Capacity: 256 ug/dL (ref 250–450)
UIBC: 194 ug/dL (ref 111–343)

## 2019-12-16 LAB — FERRITIN: Ferritin: 115 ng/mL (ref 30–400)

## 2019-12-26 ENCOUNTER — Ambulatory Visit: Payer: Self-pay | Admitting: Pharmacist

## 2019-12-26 DIAGNOSIS — E1142 Type 2 diabetes mellitus with diabetic polyneuropathy: Secondary | ICD-10-CM

## 2019-12-26 NOTE — Chronic Care Management (AMB) (Signed)
Chronic Care Management   Follow Up Note   12/26/2019 Name: Zaydrian Batta MRN: [id number] DOB: Aug 18, 1930  Referred by: Valerie Roys, DO Reason for referral : Chronic Care Management (Medication Management)   Mateen Franssen is a 86 y.o. year old male who is a primary care patient of Valerie Roys, DO. The CCM team was consulted for assistance with chronic disease management and care coordination needs.    Contacted patient for medication management review.   Review of patient status, including review of consultants reports, relevant laboratory and other test results, and collaboration with appropriate care team members and the patient's provider was performed as part of comprehensive patient evaluation and provision of chronic care management services.    SDOH (Social Determinants of Health) assessments performed: Yes See Care Plan activities for detailed interventions related to SDOH)  SDOH Interventions     Most Recent Value  SDOH Interventions  Financial Strain Interventions Other (Comment)  [referral to health plan for medication assistance]       Outpatient Encounter Medications as of 12/26/2019  Medication Sig Note  . acetaminophen (TYLENOL 8 HOUR ARTHRITIS PAIN) 650 MG CR tablet Take 650 mg by mouth every 8 (eight) hours as needed for pain.   [name] Kitchen apixaban (ELIQUIS) 5 MG TABS tablet Take 1 tablet (5 mg total) by mouth 2 (two) times daily.   [name] Kitchen atorvastatin (LIPITOR) 20 MG tablet Take 1 tablet (20 mg total) by mouth daily.   . blood glucose meter kit and supplies KIT Dispense based on patient and insurance preference. Use up to four times daily as directed. (FOR ICD-9 250.00, 250.01).   . Blood Glucose Monitoring Suppl (ACCU-CHEK AVIVA PLUS) w/Device KIT Use to check sugar levels x 2 daily   . carbidopa-levodopa (SINEMET IR) 25-100 MG tablet Take 0.5 tablets by mouth 3 (three) times daily.    . digoxin (LANOXIN) 0.125 MG tablet Take 1 tablet (0.125 mg total) by mouth every other  day.   . docusate sodium (COLACE) 100 MG capsule Take 1 capsule (100 mg total) by mouth 2 (two) times daily as needed for mild constipation.   . DULoxetine (CYMBALTA) 60 MG capsule Take 1 capsule (60 mg total) by mouth daily.   . empagliflozin (JARDIANCE) 25 MG TABS tablet Take 1 tablet (25 mg total) by mouth daily before breakfast.   . furosemide (LASIX) 20 MG tablet Take 1.5 tablets (30 mg) by mouth once daily (Patient taking differently: 20 mg. Take 1.5 tablets (30 mg) by mouth once daily)   . gabapentin (NEURONTIN) 100 MG capsule Take 1 capsule (100 mg total) by mouth at bedtime.   . hydrALAZINE (APRESOLINE) 50 MG tablet Take 1 tablet (50 mg total) by mouth 3 (three) times daily.   . Lancets (ACCU-CHEK SOFT TOUCH) lancets Use as instructed   . latanoprost (XALATAN) 0.005 % ophthalmic solution Place 1 drop into both eyes at bedtime.    . Magnesium Oxide 400 (240 Mg) MG TABS Take 1 tablet (400 mg total) by mouth 2 (two) times daily.   . metFORMIN (GLUCOPHAGE) 500 MG tablet Take 1 tablet (500 mg total) by mouth 2 (two) times daily with a meal.   . naproxen sodium (ALEVE) 220 MG tablet Take 220 mg by mouth 2 (two) times daily as needed.  08/07/2017: Patient reports taking at 2 to 3 tablets a day.   [name] ULTRA test strip USE TO CHECK SUGAR LEVELS TWICE DAILY.   . pantoprazole (PROTONIX) 40 MG tablet Take 1  tablet (40 mg total) by mouth daily.   . propranolol ER (INDERAL LA) 120 MG 24 hr capsule Take 1 capsule (120 mg total) by mouth daily.   . tamsulosin (FLOMAX) 0.4 MG CAPS capsule Take 1 capsule (0.4 mg total) by mouth daily.    Facility-Administered Encounter Medications as of 12/26/2019  Medication  . cyanocobalamin ((VITAMIN B-12)) injection 1,000 mcg     Objective:   Goals Addressed              This Visit's Progress     Patient Stated   .  PharmD "We have questions about his medications" (pt-stated)        CARE PLAN ENTRY (see longtitudinal plan of care for additional care  plan information)  Current Barriers:  . Social, financial, and community barriers:  o Spoke with patient's daughter, [name] (on [place]). She notes that she spoke w/ [facility] and they were going to mail necessary paperwork for them to be able to speak to [facility] pharmacy team regarding medication needs.  . Polypharmacy; complex patient with multiple comorbidities including CAD, CHF, Afib, CVA, T2DM, CKD . Son and daughter in law [name], [phone]) live with patient and [name] fills a pill box for him . Most recent eGFR: >60 o T2DM: last A1c 8.4%; metformin to 500 mg BID, Jardiance 25 mg daily o CAD, HTN, CHF: [facility]; furosemide 20 mg daily, hydralazine 50 mg TID, propanolol LA 120 mg (also used to help tremor), digoxin 0.125 mg every other day o Afib: Eliquis 5 mg BID o ASCVD: atorvastatin 20 mg daily; last LDL at goal <70 o BPH: tamsulosin 0.4 mg daily o Suspected PD: carbidopa/levodopa 25/100 0.5 mg TID;  o Tremor/neuropathy: duloxetine 60 mg daily o GERD: pantoprazole 40 mg daily  Pharmacist Clinical Goal(s):  [name], patient will work with PharmD and provider towards optimized medication management  Interventions: . Called [name]. Ensured she is going check the patient's mail today, and if there is no paperwork from [facility], she is going to call [facility] again. Once paperwork is completed, she will call [facility] and discuss speaking with pharmacy team regarding medication access needs.  . Denies any other pharmacy needs at this time  Patient Self Care Activities:  . Patient will take medications as prescribed  Please see past updates related to this goal by clicking on the "Past Updates" button in the selected goal          Plan:  - Scheduled f/u call w/ PharmD in 8-10 weeks   Catie Darnelle Maffucci, PharmD, [place] [phone]

## 2019-12-26 NOTE — Patient Instructions (Signed)
Visit Information  Goals Addressed              This Visit's Progress     Patient Stated   .  PharmD "We have questions about his medications" (pt-stated)        CARE PLAN ENTRY (see longtitudinal plan of care for additional care plan information)  Current Barriers:  . Social, financial, and community barriers:  o Spoke with patient's daughter, [name] (on [place]). She notes that she spoke w/ [facility] and they were going to mail necessary paperwork for them to be able to speak to [facility] pharmacy team regarding medication needs.  . Polypharmacy; complex patient with multiple comorbidities including CAD, CHF, Afib, CVA, T2DM, CKD . Son and daughter in law [name], [phone]) live with patient and [name] fills a pill box for him . Most recent eGFR: >60 o T2DM: last A1c 8.4%; metformin to 500 mg BID, Jardiance 25 mg daily o CAD, HTN, CHF: follows [facility]/ [facility]; furosemide 20 mg daily, hydralazine 50 mg TID, propanolol LA 120 mg (also used to help tremor), digoxin 0.125 mg every other day o Afib: Eliquis 5 mg BID o ASCVD: atorvastatin 20 mg daily; last LDL at goal <70 o BPH: tamsulosin 0.4 mg daily o Suspected PD: carbidopa/levodopa 25/100 0.5 mg TID;  o Tremor/neuropathy: duloxetine 60 mg daily o GERD: pantoprazole 40 mg daily  Pharmacist Clinical Goal(s):  [name], patient will work with PharmD and provider towards optimized medication management  Interventions: . Called [name]. Ensured she is going check the patient's mail today, and if there is no paperwork from [facility], she is going to call [facility] again. Once paperwork is completed, she will call [facility] and discuss speaking with pharmacy team regarding medication access needs.  . Denies any other pharmacy needs at this time  Patient Self Care Activities:  . Patient will take medications as prescribed  Please see past updates related to this goal by clicking on the "Past Updates" button in the selected goal         The  patient verbalized understanding of instructions provided today and agreed to receive a mailed copy of patient instruction and/or educational materials.   Plan:  - Scheduled f/u call w/ PharmD in 8-10 weeks   Catie Darnelle Maffucci, PharmD, [place] [phone]

## 2020-01-01 DIAGNOSIS — D8689 Sarcoidosis of other sites: Secondary | ICD-10-CM | POA: Diagnosis not present

## 2020-01-01 DIAGNOSIS — I69322 Dysarthria following cerebral infarction: Secondary | ICD-10-CM | POA: Diagnosis not present

## 2020-01-01 DIAGNOSIS — I69351 Hemiplegia and hemiparesis following cerebral infarction affecting right dominant side: Secondary | ICD-10-CM | POA: Diagnosis not present

## 2020-01-10 DIAGNOSIS — I501 Left ventricular failure: Secondary | ICD-10-CM | POA: Diagnosis not present

## 2020-01-10 DIAGNOSIS — I2510 Atherosclerotic heart disease of native coronary artery without angina pectoris: Secondary | ICD-10-CM | POA: Diagnosis not present

## 2020-01-10 DIAGNOSIS — R0902 Hypoxemia: Secondary | ICD-10-CM | POA: Diagnosis not present

## 2020-01-12 ENCOUNTER — Other Ambulatory Visit: Payer: Self-pay | Admitting: Family Medicine

## 2020-01-12 NOTE — Telephone Encounter (Signed)
Called patient's daughter to verify if patient needs the contour next test strip as this is not on his medication list. Patient's daughter states she will check on this and will call back.

## 2020-01-12 NOTE — Telephone Encounter (Signed)
Requested medication (s) are due for refill today: no Requested medication (s) are on the active medication list: no  Last refill:  06/26/2019  Future visit scheduled: no  Notes to clinic:  review for correct strips  Don't see on patient list    Requested Prescriptions  Pending Prescriptions Disp Refills   CONTOUR NEXT TEST test strip [Pharmacy Med Name: CONTOUR NEXT TEST STRIP] 50 strip 0    Sig: USE TO CHECK SUGAR LEVELS TWICE DAILY.      Endocrinology: Diabetes - Testing Supplies Passed - 01/12/2020 12:51 PM      Passed - Valid encounter within last 12 months    Recent Outpatient Visits           2 months ago Type 2 diabetes mellitus with peripheral neuropathy ([place])   [place], Megan P, DO   5 months ago Type 2 diabetes mellitus with peripheral neuropathy ([place])   [place], Megan P, DO   9 months ago Type 2 diabetes mellitus with peripheral neuropathy ([place])   [place], Megan P, DO   10 months ago Essential hypertension   [place], [place], DO   11 months ago Essential hypertension   [place], [place], DO       Future Appointments             In 1 month Johnson, Barb Merino, DO [employer], [facility]   In 2 months Gollan, Kathlene November, MD [employer]   In 4 months Stoioff, Ronda Fairly, MD [facility]   In 9 months  [employer], [place]

## 2020-01-24 ENCOUNTER — Other Ambulatory Visit: Payer: Self-pay | Admitting: Cardiovascular Disease

## 2020-01-28 DIAGNOSIS — E119 Type 2 diabetes mellitus without complications: Secondary | ICD-10-CM | POA: Diagnosis not present

## 2020-01-28 LAB — HM DIABETES EYE EXAM

## 2020-02-05 DIAGNOSIS — E119 Type 2 diabetes mellitus without complications: Secondary | ICD-10-CM | POA: Diagnosis not present

## 2020-02-05 DIAGNOSIS — I6990 Unspecified sequelae of unspecified cerebrovascular disease: Secondary | ICD-10-CM | POA: Diagnosis not present

## 2020-02-05 DIAGNOSIS — Z9119 Patient's noncompliance with other medical treatment and regimen: Secondary | ICD-10-CM | POA: Diagnosis not present

## 2020-02-05 DIAGNOSIS — I2510 Atherosclerotic heart disease of native coronary artery without angina pectoris: Secondary | ICD-10-CM | POA: Diagnosis not present

## 2020-02-10 DIAGNOSIS — I2510 Atherosclerotic heart disease of native coronary artery without angina pectoris: Secondary | ICD-10-CM | POA: Diagnosis not present

## 2020-02-10 DIAGNOSIS — R0902 Hypoxemia: Secondary | ICD-10-CM | POA: Diagnosis not present

## 2020-02-10 DIAGNOSIS — I501 Left ventricular failure: Secondary | ICD-10-CM | POA: Diagnosis not present

## 2020-02-16 ENCOUNTER — Encounter: Payer: Self-pay | Admitting: Family Medicine

## 2020-02-16 ENCOUNTER — Ambulatory Visit (INDEPENDENT_AMBULATORY_CARE_PROVIDER_SITE_OTHER): Payer: PPO | Admitting: Family Medicine

## 2020-02-16 ENCOUNTER — Other Ambulatory Visit: Payer: Self-pay

## 2020-02-16 VITALS — BP 131/74 | HR 79 | Temp 97.8°F | Wt 172.0 lb

## 2020-02-16 DIAGNOSIS — N183 Chronic kidney disease, stage 3 unspecified: Secondary | ICD-10-CM | POA: Diagnosis not present

## 2020-02-16 DIAGNOSIS — K219 Gastro-esophageal reflux disease without esophagitis: Secondary | ICD-10-CM | POA: Diagnosis not present

## 2020-02-16 DIAGNOSIS — D631 Anemia in chronic kidney disease: Secondary | ICD-10-CM | POA: Diagnosis not present

## 2020-02-16 DIAGNOSIS — C61 Malignant neoplasm of prostate: Secondary | ICD-10-CM

## 2020-02-16 DIAGNOSIS — E538 Deficiency of other specified B group vitamins: Secondary | ICD-10-CM

## 2020-02-16 DIAGNOSIS — E782 Mixed hyperlipidemia: Secondary | ICD-10-CM | POA: Diagnosis not present

## 2020-02-16 DIAGNOSIS — E1142 Type 2 diabetes mellitus with diabetic polyneuropathy: Secondary | ICD-10-CM

## 2020-02-16 DIAGNOSIS — I10 Essential (primary) hypertension: Secondary | ICD-10-CM | POA: Diagnosis not present

## 2020-02-16 DIAGNOSIS — Z23 Encounter for immunization: Secondary | ICD-10-CM | POA: Diagnosis not present

## 2020-02-16 DIAGNOSIS — M79674 Pain in right toe(s): Secondary | ICD-10-CM | POA: Diagnosis not present

## 2020-02-16 LAB — BAYER DCA HB A1C WAIVED: HB A1C (BAYER DCA - WAIVED): 7 % — ABNORMAL HIGH (ref <7.0)

## 2020-02-16 MED ORDER — EMPAGLIFLOZIN 25 MG PO TABS: 25.0000 mg | ORAL_TABLET | Freq: Every day | ORAL | 1 refills | Status: DC

## 2020-02-16 MED ORDER — CYANOCOBALAMIN 1000 MCG/ML IJ SOLN
1000.0000 ug | Freq: Once | INTRAMUSCULAR | Status: AC
  Administered 2020-02-16: 1000 ug via INTRAMUSCULAR

## 2020-02-16 NOTE — Progress Notes (Addendum)
BP 131/74   Pulse 79   Temp 97.8 F (36.6 C) (Oral)   Wt 172 lb (78 kg)   SpO2 100%   BMI 26.15 kg/m    Subjective:    Patient ID: Todd [name], male    DOB: [birth year], 86 y.o.   MRN: [id number]  HPI: Todd [name] is a 86 y.o. male  Chief Complaint  Patient presents with  . Diabetes  . B12 Injection  . Hypertension  . Hyperlipidemia   HYPERTENSION / HYPERLIPIDEMIA Satisfied with current treatment? yes Duration of hypertension: chronic BP monitoring frequency: not checking BP medication side effects: no Past BP meds: propranolol, furosemide, hydralazine, digoxin Duration of hyperlipidemia: chronic Cholesterol medication side effects: no Cholesterol supplements: none Past cholesterol medications: atorvastatin Medication compliance: excellent compliance Aspirin: no Recent stressors: no Recurrent headaches: no Visual changes: no Palpitations: no Dyspnea: no Chest pain: no Lower extremity edema: no Dizzy/lightheaded: no  DIABETES Hypoglycemic episodes:no Polydipsia/polyuria: no Visual disturbance: no Chest pain: no Paresthesias: no Glucose Monitoring: no  Accucheck frequency: Not Checking Taking Insulin?: no Blood Pressure Monitoring: not checking Retinal Examination: Not up to Date Foot Exam: Up to Date Diabetic Education: Completed Pneumovax: Up to Date Influenza: Up to Date Aspirin: no  Relevant past medical, surgical, family and social history reviewed and updated as indicated. Interim medical history since our last visit reviewed. Allergies and medications reviewed and updated.  Review of Systems  Constitutional: Negative.   Respiratory: Negative.   Cardiovascular: Negative.   Gastrointestinal: Negative.   Musculoskeletal: Negative.   Skin: Negative.   Neurological: Positive for tremors, speech difficulty and weakness. Negative for dizziness, seizures, syncope, facial asymmetry, light-headedness, numbness and headaches.  Psychiatric/Behavioral:  Positive for dysphoric mood. Negative for agitation, behavioral problems, confusion, decreased concentration, hallucinations, self-injury, sleep disturbance and suicidal ideas. The patient is not nervous/anxious and is not hyperactive.     Per HPI unless specifically indicated above     Objective:    BP 131/74   Pulse 79   Temp 97.8 F (36.6 C) (Oral)   Wt 172 lb (78 kg)   SpO2 100%   BMI 26.15 kg/m   Wt Readings from Last 3 Encounters:  02/16/20 172 lb (78 kg)  11/25/19 169 lb 4 oz (76.8 kg)  11/17/19 180 lb (81.6 kg)    Physical Exam Vitals and nursing note reviewed.  Constitutional:      General: He is not in acute distress.    Appearance: Normal appearance. He is not ill-appearing, toxic-appearing or diaphoretic.  HENT:     Head: Normocephalic and atraumatic.     Right Ear: External ear normal.     Left Ear: External ear normal.     Nose: Nose normal.     Mouth/Throat:     Mouth: Mucous membranes are moist.     Pharynx: Oropharynx is clear.  Eyes:     General: No scleral icterus.       Right eye: No discharge.        Left eye: No discharge.     Extraocular Movements: Extraocular movements intact.     Conjunctiva/sclera: Conjunctivae normal.     Pupils: Pupils are equal, round, and reactive to light.  Cardiovascular:     Rate and Rhythm: Normal rate and regular rhythm.     Pulses: Normal pulses.     Heart sounds: Normal heart sounds. No murmur heard.  No friction rub. No gallop.   Pulmonary:     Effort: Pulmonary effort is normal.  No respiratory distress.     Breath sounds: Normal breath sounds. No stridor. No wheezing, rhonchi or rales.  Chest:     Chest wall: No tenderness.  Musculoskeletal:        General: Normal range of motion.     Cervical back: Normal range of motion and neck supple.  Skin:    General: Skin is warm and dry.     Capillary Refill: Capillary refill takes less than 2 seconds.     Coloration: Skin is not jaundiced or pale.     Findings:  No bruising, erythema, lesion or rash.  Neurological:     Mental Status: He is alert and oriented to person, place, and time. Mental status is at baseline.     Comments: Weakness, tremor  Psychiatric:        Mood and Affect: Mood is depressed.        Behavior: Behavior normal.        Thought Content: Thought content normal.        Judgment: Judgment normal.     Results for orders placed or performed in visit on 02/16/20  CBC with Differential/Platelet  Result Value Ref Range   WBC 7.0 3.4 - 10.8 x10E3/uL   RBC 3.46 (L) 4.14 - 5.80 x10E6/uL   Hemoglobin 10.9 (L) 13.0 - 17.7 g/dL   Hematocrit 33.1 (L) 37.5 - 51.0 %   MCV 96 79 - 97 fL   MCH 31.5 26.6 - 33.0 pg   MCHC 32.9 31 - 35 g/dL   RDW 11.8 11.6 - 15.4 %   Platelets 230 150 - 450 x10E3/uL   Neutrophils 63 Not Estab. %   Lymphs 22 Not Estab. %   Monocytes 10 Not Estab. %   Eos 2 Not Estab. %   Basos 1 Not Estab. %   Neutrophils Absolute 4.4 1 - 7 x10E3/uL   Lymphocytes Absolute 1.6 0 - 3 x10E3/uL   Monocytes Absolute 0.7 0 - 0 x10E3/uL   EOS (ABSOLUTE) 0.2 0.0 - 0.4 x10E3/uL   Basophils Absolute 0.0 0 - 0 x10E3/uL   Immature Granulocytes 2 Not Estab. %   Immature Grans (Abs) 0.1 0.0 - 0.1 x10E3/uL  Comprehensive metabolic panel  Result Value Ref Range   Glucose 171 (H) 65 - 99 mg/dL   BUN 28 (H) 8 - 27 mg/dL   Creatinine, Ser 1.23 0.76 - 1.27 mg/dL   GFR calc non Af Amer 52 (L) >59 mL/min/1.73   GFR calc Af Amer 61 >59 mL/min/1.73   BUN/Creatinine Ratio 23 10 - 24   Sodium 140 134 - 144 mmol/L   Potassium 4.4 3.5 - 5.2 mmol/L   Chloride 99 96 - 106 mmol/L   CO2 28 20 - 29 mmol/L   Calcium 9.6 8.6 - 10.2 mg/dL   Total Protein 6.9 6.0 - 8.5 g/dL   Albumin 4.4 3.6 - 4.6 g/dL   Globulin, Total 2.5 1.5 - 4.5 g/dL   Albumin/Globulin Ratio 1.8 1.2 - 2.2   Bilirubin Total 0.2 0.0 - 1.2 mg/dL   Alkaline Phosphatase 83 44 - 121 IU/L   AST 13 0 - 40 IU/L   ALT 5 0 - 44 IU/L  Lipid Panel w/o Chol/HDL Ratio  Result  Value Ref Range   Cholesterol, Total 137 100 - 199 mg/dL   Triglycerides 231 (H) 0 - 149 mg/dL   HDL 41 >39 mg/dL   VLDL Cholesterol Cal 37 5 - 40 mg/dL   LDL Chol Calc (NIH)  59 0 - 99 mg/dL  Bayer DCA Hb A1c Waived  Result Value Ref Range   HB A1C (BAYER DCA - WAIVED) 7.0 (H) <7.0 %  TSH  Result Value Ref Range   TSH 0.843 0.450 - 4.500 uIU/mL  B12  Result Value Ref Range   Vitamin B-12 938 232 - 1,245 pg/mL  PSA  Result Value Ref Range   Prostate Specific Ag, Serum 3.6 0.0 - 4.0 ng/mL  Urinalysis, Routine w reflex microscopic  Result Value Ref Range   Specific Gravity, UA <1.005 (L) 1.005 - 1.030   pH, UA 5.0 5.0 - 7.5   Color, UA Yellow Yellow   Appearance Ur Clear Clear   Leukocytes,UA Negative Negative   Protein,UA Negative Negative/Trace   Glucose, UA Urine Negative   Ketones, UA Negative Negative   RBC, UA Negative Negative   Bilirubin, UA Negative Negative   Urobilinogen, Ur 0.2 0.2 - 1.0 mg/dL   Nitrite, UA Negative Negative  Microalbumin, Urine Waived  Result Value Ref Range   Microalb, Ur Waived 10 0 - 19 mg/L   Creatinine, Urine Waived 50 10 - 300 mg/dL   Microalb/Creat Ratio 30-300 (H) <30 mg/g      Assessment & Plan:   Problem List Items Addressed This Visit      Cardiovascular and Mediastinum   Hypertension - Primary    Under good control on current regimen. Continue current regimen. Continue to monitor. Call with any concerns. Refills given. Labs drawn today.        Relevant Orders   Comprehensive metabolic panel (Completed)   TSH (Completed)   Microalbumin, Urine Waived     Digestive   GERD (gastroesophageal reflux disease)    Under good control on current regimen. Continue current regimen. Continue to monitor. Call with any concerns. Refills given. Labs drawn today.        Relevant Medications   Polyethylene Glycol 3350 (MIRALAX PO)   Other Relevant Orders   CBC with Differential/Platelet (Completed)   Comprehensive metabolic panel  (Completed)     Endocrine   Type 2 diabetes mellitus with peripheral neuropathy ([place])    Doing well with A1c of 7.0 down from 8.4. Continue current regimen. Continue to monitor. Call with any concerns.      Relevant Medications   empagliflozin (JARDIANCE) 25 MG TABS tablet   Other Relevant Orders   Comprehensive metabolic panel (Completed)   Bayer DCA Hb A1c Waived (Completed)   Urinalysis, Routine w reflex microscopic   Microalbumin, Urine Waived   Urinalysis, Routine w reflex microscopic (Completed)   Microalbumin, Urine Waived (Completed)     Genitourinary   Prostate cancer ([place])    Continue to follow with urology. Call with any concerns.       Relevant Orders   Comprehensive metabolic panel (Completed)   PSA (Completed)   Urinalysis, Routine w reflex microscopic (Completed)   Microalbumin, Urine Waived (Completed)   Anemia due to stage 3 chronic kidney disease    Under good control on current regimen. Continue current regimen. Continue to monitor. Call with any concerns. Refills given. Labs drawn today.        Relevant Orders   CBC with Differential/Platelet (Completed)   Comprehensive metabolic panel (Completed)     Other   Mixed hyperlipidemia    Under good control on current regimen. Continue current regimen. Continue to monitor. Call with any concerns. Refills given. Labs drawn today.        Relevant Orders  Comprehensive metabolic panel (Completed)   Lipid Panel w/o Chol/HDL Ratio (Completed)   B12 deficiency    Rechecking labs today. B12 shot given today. Call with any concerns.       Relevant Orders   Comprehensive metabolic panel (Completed)   B12 (Completed)    Other Visit Diagnoses    Flu vaccine need       Flu shot given today.   Relevant Orders   Flu Vaccine QUAD High Dose(Fluad) (Completed)   Great toe pain, right       Will check for fracture. Call with any concerns.    Relevant Orders   DG Foot Complete Right (Completed)        Follow up plan: Return in about 3 months (around 05/17/2020).

## 2020-02-17 ENCOUNTER — Encounter: Payer: Self-pay | Admitting: Family Medicine

## 2020-02-17 LAB — COMPREHENSIVE METABOLIC PANEL
ALT: 5 IU/L (ref 0–44)
AST: 13 IU/L (ref 0–40)
Albumin/Globulin Ratio: 1.8 (ref 1.2–2.2)
Albumin: 4.4 g/dL (ref 3.6–4.6)
Alkaline Phosphatase: 83 IU/L (ref 44–121)
BUN/Creatinine Ratio: 23 (ref 10–24)
BUN: 28 mg/dL — ABNORMAL HIGH (ref 8–27)
Bilirubin Total: 0.2 mg/dL (ref 0.0–1.2)
CO2: 28 mmol/L (ref 20–29)
Calcium: 9.6 mg/dL (ref 8.6–10.2)
Chloride: 99 mmol/L (ref 96–106)
Creatinine, Ser: 1.23 mg/dL (ref 0.76–1.27)
GFR calc Af Amer: 61 mL/min/{1.73_m2} (ref >59)
GFR calc non Af Amer: 52 mL/min/{1.73_m2} — ABNORMAL LOW (ref >59)
Globulin, Total: 2.5 g/dL (ref 1.5–4.5)
Glucose: 171 mg/dL — ABNORMAL HIGH (ref 65–99)
Potassium: 4.4 mmol/L (ref 3.5–5.2)
Sodium: 140 mmol/L (ref 134–144)
Total Protein: 6.9 g/dL (ref 6.0–8.5)

## 2020-02-17 LAB — URINALYSIS, ROUTINE W REFLEX MICROSCOPIC
Bilirubin, UA: NEGATIVE
Ketones, UA: NEGATIVE
Leukocytes,UA: NEGATIVE
Nitrite, UA: NEGATIVE
Protein,UA: NEGATIVE
RBC, UA: NEGATIVE
Specific Gravity, UA: <1.005 — ABNORMAL LOW (ref 1.005–1.030)
Urobilinogen, Ur: 0.2 mg/dL (ref 0.2–1.0)
pH, UA: 5 (ref 5.0–7.5)

## 2020-02-17 LAB — CBC WITH DIFFERENTIAL/PLATELET
Basophils Absolute: 0 10*3/uL (ref 0.0–0.2)
Basos: 1 %
EOS (ABSOLUTE): 0.2 10*3/uL (ref 0.0–0.4)
Eos: 2 %
Hematocrit: 33.1 % — ABNORMAL LOW (ref 37.5–51.0)
Hemoglobin: 10.9 g/dL — ABNORMAL LOW (ref 13.0–17.7)
Immature Grans (Abs): 0.1 10*3/uL (ref 0.0–0.1)
Immature Granulocytes: 2 %
Lymphocytes Absolute: 1.6 10*3/uL (ref 0.7–3.1)
Lymphs: 22 %
MCH: 31.5 pg (ref 26.6–33.0)
MCHC: 32.9 g/dL (ref 31.5–35.7)
MCV: 96 fL (ref 79–97)
Monocytes Absolute: 0.7 10*3/uL (ref 0.1–0.9)
Monocytes: 10 %
Neutrophils Absolute: 4.4 10*3/uL (ref 1.4–7.0)
Neutrophils: 63 %
Platelets: 230 10*3/uL (ref 150–450)
RBC: 3.46 x10E6/uL — ABNORMAL LOW (ref 4.14–5.80)
RDW: 11.8 % (ref 11.6–15.4)
WBC: 7 10*3/uL (ref 3.4–10.8)

## 2020-02-17 LAB — PSA: Prostate Specific Ag, Serum: 3.6 ng/mL (ref 0.0–4.0)

## 2020-02-17 LAB — MICROALBUMIN, URINE WAIVED
Creatinine, Urine Waived: 50 mg/dL (ref 10–300)
Microalb, Ur Waived: 10 mg/L (ref 0–19)

## 2020-02-17 LAB — VITAMIN B12: Vitamin B-12: 938 pg/mL (ref 232–1245)

## 2020-02-17 LAB — LIPID PANEL W/O CHOL/HDL RATIO
Cholesterol, Total: 137 mg/dL (ref 100–199)
HDL: 41 mg/dL (ref >39)
LDL Chol Calc (NIH): 59 mg/dL (ref 0–99)
Triglycerides: 231 mg/dL — ABNORMAL HIGH (ref 0–149)
VLDL Cholesterol Cal: 37 mg/dL (ref 5–40)

## 2020-02-17 LAB — TSH: TSH: 0.843 u[IU]/mL (ref 0.450–4.500)

## 2020-02-18 ENCOUNTER — Ambulatory Visit
Admission: RE | Admit: 2020-02-18 | Discharge: 2020-02-18 | Disposition: A | Discharge status: Home / Self Care | Payer: PPO | Attending: Family Medicine | Admitting: Family Medicine

## 2020-02-18 ENCOUNTER — Telehealth: Payer: Self-pay | Admitting: Family Medicine

## 2020-02-18 ENCOUNTER — Ambulatory Visit
Admission: RE | Admit: 2020-02-18 | Discharge: 2020-02-18 | Disposition: A | Discharge status: Home / Self Care | Payer: PPO | Source: Ambulatory Visit | Attending: Family Medicine | Admitting: Family Medicine

## 2020-02-18 ENCOUNTER — Other Ambulatory Visit: Payer: Self-pay

## 2020-02-18 DIAGNOSIS — M79674 Pain in right toe(s): Secondary | ICD-10-CM | POA: Diagnosis not present

## 2020-02-18 NOTE — Telephone Encounter (Signed)
Copied from [place] [phone]. Topic: General - Other >> Feb 18, 2020  2:31 PM Celene Kras wrote: Reason for CRM: Sharion Dove, pts daughter, called stating that the pt is having trouble with his toe. She states that she spoke with PCP Monday and was told of a walk in clinic that would look at his toe for him. Please advise.

## 2020-02-22 ENCOUNTER — Encounter: Payer: Self-pay | Admitting: Family Medicine

## 2020-02-22 NOTE — Assessment & Plan Note (Signed)
Under good control on current regimen. Continue current regimen. Continue to monitor. Call with any concerns. Refills given. Labs drawn today.   

## 2020-02-22 NOTE — Assessment & Plan Note (Signed)
Doing well with A1c of 7.0 down from 8.4. Continue current regimen. Continue to monitor. Call with any concerns.

## 2020-02-22 NOTE — Assessment & Plan Note (Signed)
Rechecking labs today. B12 shot given today. Call with any concerns.

## 2020-02-22 NOTE — Assessment & Plan Note (Signed)
Continue to follow with urology. Call with any concerns.  °

## 2020-02-23 DIAGNOSIS — I2510 Atherosclerotic heart disease of native coronary artery without angina pectoris: Secondary | ICD-10-CM | POA: Diagnosis not present

## 2020-02-23 DIAGNOSIS — Z9119 Patient's noncompliance with other medical treatment and regimen: Secondary | ICD-10-CM | POA: Diagnosis not present

## 2020-02-23 DIAGNOSIS — I6990 Unspecified sequelae of unspecified cerebrovascular disease: Secondary | ICD-10-CM | POA: Diagnosis not present

## 2020-02-23 DIAGNOSIS — E119 Type 2 diabetes mellitus without complications: Secondary | ICD-10-CM | POA: Diagnosis not present

## 2020-02-24 ENCOUNTER — Telehealth: Payer: Self-pay

## 2020-03-01 ENCOUNTER — Ambulatory Visit: Payer: Self-pay | Admitting: Pharmacist

## 2020-03-01 DIAGNOSIS — I10 Essential (primary) hypertension: Secondary | ICD-10-CM

## 2020-03-01 DIAGNOSIS — E1142 Type 2 diabetes mellitus with diabetic polyneuropathy: Secondary | ICD-10-CM

## 2020-03-01 NOTE — Chronic Care Management (AMB) (Signed)
Chronic Care Management Pharmacy  Name: Todd [name]  MRN: [id number] DOB: Aug 05, 1930   Chief Complaint/ HPI  Todd [name],  86 y.o. , male presents for their Follow-Up CCM visit with the clinical pharmacist via telephone due to COVID-19 Pandemic.  PCP : Valerie Roys, DO Patient Care Team: Valerie Roys, DO as PCP - General (Family Medicine) Rockey Situ Kathlene November, MD as PCP - Cardiology (Cardiology) Collier Flowers, MD as Referring Physician (Urology) Birder Robson, MD as Referring Physician (Ophthalmology) Reche Dixon, PA-C (Orthopedic Surgery) Vladimir Crofts, MD (Neurology) Vladimir Faster, [facility] (Pharmacist)  Their chronic conditions include: Hypertension, Hyperlipidemia, Diabetes, Atrial Fibrillation, Heart Failure, GERD and Depression   Office Visits: 02/16/20- Dr. Wynetta Emery - blood work, B12 1000 mcg IM injection, B12 level=938, flu shot given, DG foot for Rt great toe pain  Consult Visit: 11/25/19- C. Gilford Rile, NP- cardiology - EKG,  Echo lvef 60-65 %   Allergies  Allergen Reactions  . Amlodipine     Lower extremity swelling at 10 mg.  . Fluoxetine Other (See Comments)    headache  . Meloxicam Other (See Comments)    Other reaction(s): Dizziness Nervousness.     Medications: Outpatient Encounter Medications as of 03/01/2020  Medication Sig Note  . apixaban (ELIQUIS) 5 MG TABS tablet Take 1 tablet (5 mg total) by mouth 2 (two) times daily.   [name] Kitchen atorvastatin (LIPITOR) 20 MG tablet Take 1 tablet (20 mg total) by mouth daily.   . blood glucose meter kit and supplies KIT Dispense based on patient and insurance preference. Use up to four times daily as directed. (FOR ICD-9 250.00, 250.01).   . Blood Glucose Monitoring Suppl (ACCU-CHEK AVIVA PLUS) w/Device KIT Use to check sugar levels x 2 daily   . carbidopa-levodopa (SINEMET IR) 25-100 MG tablet Take 0.5 tablets by mouth 3 (three) times daily.    . digoxin (LANOXIN) 0.125 MG tablet Take 1 tablet (0.125 mg total) by  mouth every other day.   . docusate sodium (COLACE) 100 MG capsule Take 1 capsule (100 mg total) by mouth 2 (two) times daily as needed for mild constipation.   . DULoxetine (CYMBALTA) 60 MG capsule Take 1 capsule (60 mg total) by mouth daily.   . empagliflozin (JARDIANCE) 25 MG TABS tablet Take 1 tablet (25 mg total) by mouth daily before breakfast.   . furosemide (LASIX) 20 MG tablet Take 1.5 tablets (30 mg) by mouth once daily (Patient taking differently: 20 mg. Take 1.5 tablets (30 mg) by mouth once daily) 03/01/2020: Taking 1.5 tabs per daughter Todd [name]  . gabapentin (NEURONTIN) 100 MG capsule Take 1 capsule (100 mg total) by mouth at bedtime.   . hydrALAZINE (APRESOLINE) 50 MG tablet Take 1 tablet (50 mg total) by mouth 3 (three) times daily.   [name] Kitchen latanoprost (XALATAN) 0.005 % ophthalmic solution Place 1 drop into both eyes at bedtime.    . Magnesium Oxide 400 (240 Mg) MG TABS Take 1 tablet (400 mg total) by mouth 2 (two) times daily.   . metFORMIN (GLUCOPHAGE) 500 MG tablet Take 1 tablet (500 mg total) by mouth 2 (two) times daily with a meal.   . ONETOUCH ULTRA test strip USE TO CHECK SUGAR LEVELS TWICE DAILY.   . pantoprazole (PROTONIX) 40 MG tablet Take 1 tablet (40 mg total) by mouth daily.   . propranolol ER (INDERAL LA) 120 MG 24 hr capsule Take 1 capsule (120 mg total) by mouth daily.   . tamsulosin (FLOMAX) 0.4  MG CAPS capsule Take 1 capsule (0.4 mg total) by mouth daily.   [name] Kitchen acetaminophen (TYLENOL 8 HOUR ARTHRITIS PAIN) 650 MG CR tablet Take 650 mg by mouth every 8 (eight) hours as needed for pain.   . Lancets (ACCU-CHEK SOFT TOUCH) lancets Use as instructed   . naproxen sodium (ALEVE) 220 MG tablet Take 220 mg by mouth 2 (two) times daily as needed.  08/07/2017: Patient reports taking at 2 to 3 tablets a day.   . Polyethylene Glycol 3350 (MIRALAX PO) Take by mouth as needed.    No facility-administered encounter medications on file as of 03/01/2020.    Wt Readings from Last 3  Encounters:  02/16/20 172 lb (78 kg)  11/25/19 169 lb 4 oz (76.8 kg)  11/17/19 180 lb (81.6 kg)    Current Diagnosis/Assessment:    Goals Addressed              This Visit's Progress   .  PharmD "We have questions about his medications" (pt-stated)        CARE PLAN ENTRY (see longtitudinal plan of care for additional care plan information)  Current Barriers:  . Social, financial, and community barriers:  o Spoke with patient's daughter, Todd [name] (on [place]). She notes that she returned paperwork required to [facility] approximately 2 weeks ago but has not heard anything from them.   o Reports Jardiance co-pay is ~$200/month . Polypharmacy; complex patient with multiple comorbidities including CAD, CHF, Afib, CVA, T2DM, CKD . Son and daughter in law Todd [name], [phone]) live with patient and Todd [name] fills a pill box for him . Most recent eGFR: >60 o T2DM: last A1c 7.0%; metformin to 500 mg BID, Jardiance 25 mg daily, Checking FBG daily, recent values: 9/21 191, 9/22 177, 9/23 194, 9/24 302,  9/27 211 o CAD, HTN, CHF: [facility]; furosemide 30 mg daily, hydralazine 50 mg TID, propanolol LA 120 mg (also used to help tremor), digoxin 0.125 mg every other day; BP today 145/76 , HR 95 o Afib: Eliquis 5 mg BID o ASCVD: atorvastatin 20 mg daily; last LDL at goal <70 o BPH: tamsulosin 0.4 mg daily o Suspected PD: carbidopa/levodopa 25/100 0.5 mg TID;  o Tremor/neuropathy: duloxetine 60 mg daily o GERD: pantoprazole 40 mg daily  Pharmacist Clinical Goal(s):  [name], patient will work with PharmD and provider towards optimized medication management  Interventions: . Comprehensive medication review performed, medication list updated in electronic medical record . Inter-disciplinary care team collaboration (see longitudinal plan of care) . Pharmacy team will initiate patient assistance application for Jardiance co-pay.   Patient Self Care Activities:   . Patient will take medications as prescribed  Please see past updates related to this goal by clicking on the "Past Updates" button in the selected goal         Diabetes   Recent Relevant Labs: Lab Results  Component Value Date/Time   HGBA1C 7.0 (H) 02/16/2020 03:48 PM   HGBA1C 8.4 (H) 11/10/2019 03:33 PM   MICROALBUR 10 02/16/2020 04:20 PM   MICROALBUR 80 (H) 12/13/2018 02:18 PM     Checking BG: Daily  Recent FBG Readings: 177 - 302 RPatient has failed these meds in past: not available in chart Patient is currently controlled on the following medications:  Jardiance 25 mg daily Metformin 500 mg bid  Last diabetic Foot exam:  Lab Results  Component Value Date/Time   HMDIABEYEEXA No Retinopathy 01/28/2020 12:00 AM    Last diabetic  Eye exam: No results found for: HMDIABFOOTEX   We discussed: how to recognize and treat signs of hypoglycemia . Jardiance copay is ~ $200. Will ask pharmacy team to initiate patient assistance  Plan  Continue current medications  Hypertension/HF   BP goal is:  <140/90  Office blood pressures are  BP Readings from Last 3 Encounters:  02/16/20 131/74  11/25/19 138/64  11/17/19 112/73   Patient checks BP at home daily Patient home BP readings are ranging: today's reading 145/76  Patient has failed these meds in the past: amlodipine Patient is currently controlled on the following medications:  . Propranolol ER 120 mg daily . Hydralazine 50 mg tid . Furosemide 30 mg qd  We discussed weighing daily; if you gain more than 3 pounds in one day or 5 pounds in one week call your doctor  Plan  Continue current medications      Medication Management   Pt uses [facility] for all medications Uses pill box? Yes f   Plan  Continue current medication management strategy    Follow up: 3  month phone visit  Junita Push. Kenton Kingfisher PharmD, [facility] [phone]

## 2020-03-01 NOTE — Patient Instructions (Addendum)
Visit Information  It was a pleasure speaking with you today. Thank you for letting me be part of your clinical team. Please call with any questions or concerns.   Goals Addressed              This Visit's Progress   .  PharmD "We have questions about his medications" (pt-stated)        CARE PLAN ENTRY (see longtitudinal plan of care for additional care plan information)  Current Barriers:  . Social, financial, and community barriers:  o Spoke with patient's daughter, Todd [name] (on [place]). She notes that she returned paperwork required to [facility] approximately 2 weeks ago but has not heard anything from them.   o Reports Jardiance co-pay is ~$200/month . Polypharmacy; complex patient with multiple comorbidities including CAD, CHF, Afib, CVA, T2DM, CKD . Son and daughter in law Todd [name], [phone]) live with patient and Todd [name] fills a pill box for him . Most recent eGFR: >60 o T2DM: last A1c 7.0%; metformin to 500 mg BID, Jardiance 25 mg daily, Checking FBG daily, recent values: 9/21 191, 9/22 177, 9/23 194, 9/24 302,  9/27 211 o CAD, HTN, CHF: [facility]; furosemide 30 mg daily, hydralazine 50 mg TID, propanolol LA 120 mg (also used to help tremor), digoxin 0.125 mg every other day; BP today 145/76 , HR 95 o Afib: Eliquis 5 mg BID o ASCVD: atorvastatin 20 mg daily; last LDL at goal <70 o BPH: tamsulosin 0.4 mg daily o Suspected PD: carbidopa/levodopa 25/100 0.5 mg TID;  o Tremor/neuropathy: duloxetine 60 mg daily o GERD: pantoprazole 40 mg daily  Pharmacist Clinical Goal(s):  [name], patient will work with PharmD and provider towards optimized medication management  Interventions: . Comprehensive medication review performed, medication list updated in electronic medical record . Inter-disciplinary care team collaboration (see longitudinal plan of care) . Pharmacy team will initiate patient assistance application for Jardiance co-pay.   Patient Self  Care Activities:  . Patient will take medications as prescribed  Please see past updates related to this goal by clicking on the "Past Updates" button in the selected goal         The patient verbalized understanding of instructions provided today and agreed to receive a mailed copy of patient instruction and/or educational materials.  The pharmacy team will reach out to the patient again over the next 7-10 days.  To start PAP process  Todd [name]. Todd [name] PharmD, BCPS Clinical Pharmacist [phone]  DASH Eating Plan DASH stands for "Dietary Approaches to Stop Hypertension." The DASH eating plan is a healthy eating plan that has been shown to reduce high blood pressure (hypertension). It may also reduce your risk for type 2 diabetes, heart disease, and stroke. The DASH eating plan may also help with weight loss. What are tips for following this plan?  General guidelines  Avoid eating more than 2,300 mg (milligrams) of salt (sodium) a day. If you have hypertension, you may need to reduce your sodium intake to 1,500 mg a day.  Limit alcohol intake to no more than 1 drink a day for nonpregnant women and 2 drinks a day for men. One drink equals 12 oz of beer, 5 oz of wine, or 1 oz of hard liquor.  Work with your health care provider to maintain a healthy body weight or to lose weight. Ask what an ideal weight is for you.  Get at least 30 minutes of exercise that causes your heart to  beat faster (aerobic exercise) most days of the week. Activities may include walking, swimming, or biking.  Work with your health care provider or diet and nutrition specialist (dietitian) to adjust your eating plan to your individual calorie needs. Reading food labels   Check food labels for the amount of sodium per serving. Choose foods with less than 5 percent of the Daily Value of sodium. Generally, foods with less than 300 mg of sodium per serving fit into this eating plan.  To find whole grains, look  for the word "whole" as the first word in the ingredient list. Shopping  Buy products labeled as "low-sodium" or "no salt added."  Buy fresh foods. Avoid canned foods and premade or frozen meals. Cooking  Avoid adding salt when cooking. Use salt-free seasonings or herbs instead of table salt or sea salt. Check with your health care provider or pharmacist before using salt substitutes.  Do not fry foods. Cook foods using healthy methods such as baking, boiling, grilling, and broiling instead.  Cook with heart-healthy oils, such as olive, canola, soybean, or sunflower oil. Meal planning  Eat a balanced diet that includes: ? 5 or more servings of fruits and vegetables each day. At each meal, try to fill half of your plate with fruits and vegetables. ? Up to 6-8 servings of whole grains each day. ? Less than 6 oz of lean meat, poultry, or fish each day. A 3-oz serving of meat is about the same size as a deck of cards. One egg equals 1 oz. ? 2 servings of low-fat dairy each day. ? A serving of nuts, seeds, or beans 5 times each week. ? Heart-healthy fats. Healthy fats called Omega-3 fatty acids are found in foods such as flaxseeds and coldwater fish, like sardines, salmon, and mackerel.  Limit how much you eat of the following: ? Canned or prepackaged foods. ? Food that is high in trans fat, such as fried foods. ? Food that is high in saturated fat, such as fatty meat. ? Sweets, desserts, sugary drinks, and other foods with added sugar. ? Full-fat dairy products.  Do not salt foods before eating.  Try to eat at least 2 vegetarian meals each week.  Eat more home-cooked food and less restaurant, buffet, and fast food.  When eating at a restaurant, ask that your food be prepared with less salt or no salt, if possible. What foods are recommended? The items listed may not be a complete list. Talk with your dietitian about what dietary choices are best for you. Grains Whole-grain or  whole-wheat bread. Whole-grain or whole-wheat pasta. Brown rice. Todd [name]. Bulgur. Whole-grain and low-sodium cereals. Pita bread. Low-fat, low-sodium crackers. Whole-wheat flour tortillas. Vegetables Fresh or frozen vegetables (raw, steamed, roasted, or grilled). Low-sodium or reduced-sodium tomato and vegetable juice. Low-sodium or reduced-sodium tomato sauce and tomato paste. Low-sodium or reduced-sodium canned vegetables. Fruits All fresh, dried, or frozen fruit. Canned fruit in natural juice (without added sugar). Meat and other protein foods Skinless chicken or [place]. Ground chicken or [place]. Pork with fat trimmed off. Fish and seafood. Egg whites. Dried beans, peas, or lentils. Unsalted nuts, nut butters, and seeds. Unsalted canned beans. Lean cuts of beef with fat trimmed off. Low-sodium, lean deli meat. Dairy Low-fat (1%) or fat-free (skim) milk. Fat-free, low-fat, or reduced-fat cheeses. Nonfat, low-sodium ricotta or cottage cheese. Low-fat or nonfat yogurt. Low-fat, low-sodium cheese. Fats and oils Soft margarine without trans fats. Vegetable oil. Low-fat, reduced-fat, or light mayonnaise and salad dressings (  reduced-sodium). Canola, safflower, olive, soybean, and sunflower oils. Avocado. Seasoning and other foods Herbs. Spices. Seasoning mixes without salt. Unsalted popcorn and pretzels. Fat-free sweets. What foods are not recommended? The items listed may not be a complete list. Talk with your dietitian about what dietary choices are best for you. Grains Baked goods made with fat, such as croissants, muffins, or some breads. Dry pasta or rice meal packs. Vegetables Creamed or fried vegetables. Vegetables in a cheese sauce. Regular canned vegetables (not low-sodium or reduced-sodium). Regular canned tomato sauce and paste (not low-sodium or reduced-sodium). Regular tomato and vegetable juice (not low-sodium or reduced-sodium). Angie Fava. Olives. Fruits Canned fruit in a light  or heavy syrup. Fried fruit. Fruit in cream or butter sauce. Meat and other protein foods Fatty cuts of meat. Ribs. Fried meat. Berniece Salines. Sausage. Bologna and other processed lunch meats. Salami. Fatback. Hotdogs. Bratwurst. Salted nuts and seeds. Canned beans with added salt. Canned or smoked fish. Whole eggs or egg yolks. Chicken or [place] with skin. Dairy Whole or 2% milk, cream, and half-and-half. Whole or full-fat cream cheese. Whole-fat or sweetened yogurt. Full-fat cheese. Nondairy creamers. Whipped toppings. Processed cheese and cheese spreads. Fats and oils Butter. Stick margarine. Lard. Shortening. Ghee. Bacon fat. Tropical oils, such as coconut, palm kernel, or palm oil. Seasoning and other foods Salted popcorn and pretzels. Onion salt, garlic salt, seasoned salt, table salt, and sea salt. Worcestershire sauce. Tartar sauce. Barbecue sauce. Teriyaki sauce. Soy sauce, including reduced-sodium. Steak sauce. Canned and packaged gravies. Fish sauce. Oyster sauce. Cocktail sauce. Horseradish that you find on the shelf. Ketchup. Mustard. Meat flavorings and tenderizers. Bouillon cubes. Hot sauce and Tabasco sauce. Premade or packaged marinades. Premade or packaged taco seasonings. Relishes. Regular salad dressings. Where to find more information:  National Heart, Lung, and [place]: https://wilson-eaton.com/  American Heart Association: www.heart.org Summary  The DASH eating plan is a healthy eating plan that has been shown to reduce high blood pressure (hypertension). It may also reduce your risk for type 2 diabetes, heart disease, and stroke.  With the DASH eating plan, you should limit salt (sodium) intake to 2,300 mg a day. If you have hypertension, you may need to reduce your sodium intake to 1,500 mg a day.  When on the DASH eating plan, aim to eat more fresh fruits and vegetables, whole grains, lean proteins, low-fat dairy, and heart-healthy fats.  Work with your health care provider or  diet and nutrition specialist (dietitian) to adjust your eating plan to your individual calorie needs. This information is not intended to replace advice given to you by your health care provider. Make sure you discuss any questions you have with your health care provider. Document Revised: 05/04/2017 Document Reviewed: 05/15/2016 Elsevier Patient Education  2020 [employer].

## 2020-03-02 ENCOUNTER — Telehealth: Payer: Self-pay | Admitting: Pharmacist

## 2020-03-05 ENCOUNTER — Telehealth: Payer: Self-pay

## 2020-03-11 DIAGNOSIS — I2510 Atherosclerotic heart disease of native coronary artery without angina pectoris: Secondary | ICD-10-CM | POA: Diagnosis not present

## 2020-03-11 DIAGNOSIS — R0902 Hypoxemia: Secondary | ICD-10-CM | POA: Diagnosis not present

## 2020-03-11 DIAGNOSIS — I501 Left ventricular failure: Secondary | ICD-10-CM | POA: Diagnosis not present

## 2020-03-13 NOTE — Progress Notes (Signed)
Cardiology Office Note  Date:  03/15/2020   ID:  Todd [name], DOB 1930/12/08, MRN [id number]  PCP:  Valerie Roys, DO   Chief Complaint  Patient presents with  . Other    3-4 month follow up. Patient c.o SOB. Meds reviewed verbally with patient.     HPI:  [name] is a 86 year old gentleman with past medical history of Coronary artery disease by catheterization 8-10 years ago Diabetes Hypertension Tremor GERD Hospitalization October 2018 for chest pain Hospitalization December 2018 for TIA Atrial fibrillation June 2020, maintaining normal sinus rhythm through most of 2021 Who presents for follow-up of his chest pain, atrial fib with RVR,  TIA symptoms  [name] presents with family on today's visit, On oxygen, in a wheelchair Family reports he is doing well, no new complaints They report he has chronic shortness of breath, worse when he exerts himself Has been taking Lasix 30 daily, No significant lower extremity edema, no abdominal distention, no PND orthopnea  No recent falls Tolerating his medications No recent hospitalizations  EKG personally reviewed by myself on todays visit Shows normal sinus rhythm with rate 72 bpm poor R wave progression through the anterior precordial leads, consider old anterior MI, consider old inferior MI PACs  In the hosp 11/2018 Afib/atypical atrial flutter with variable AV block with RVR: Difficult to control ventricular rates on high-dose metoprolol, low-dose digoxin added  Still waited rate, low-dose amiodarone added 200 twice daily with improved rate  -Given his age and comorbid conditions, he is not a good candidate for TEE-guided DCCV -Continue Eliquis 5 twice daily -Plan to pursue DCCV in 3-4 time We will add Lasix 20 mg daily given pulmonary hypertension on echo secondary to arrhythmia  2D Echo 11/12/2018: 1. The left ventricle has mild-moderately reduced systolic function, with an ejection fraction of 40-45%.  mild  dilatation of the aortic root measuring 38 mm.  TIA 05/2017 Hospital records reviewed with the patient in detail Speech, difficulty getting his words out sometimes Echo and carotids showed nothing acute LDL 67,  on lipitor Seen by neurology  on ASA, Plavix and statin Skilled nursing facility, then had home PT and speech  Lab work reviewed in detail, testosterone of 9 PSA stable  Carotid <39% b/l  Echo 05/2017 Left ventricle: The cavity size was normal. There was moderate   concentric hypertrophy. Systolic function was normal. The   estimated ejection fraction was in the range of 55% to 60%. Wall   motion was normal; there were no regional wall motion   abnormalities. Doppler parameters are consistent with abnormal   left ventricular relaxation (grade 1 diastolic dysfunction). - Aortic valve: There was trivial regurgitation. - Mitral valve: Calcified annulus. - Left atrium: The atrium was mildly dilated.  Pharmacologic Myoview March 17, 2017 Results reviewed with him in detail today Blood pressure demonstrated a normal response to exercise.  There was no ST segment deviation noted during stress.  The left ventricular ejection fraction is mildly decreased (45-54%).  The study is normal.  This is a low risk study.     PMH:   has a past medical history of Anxiety, Aortic regurgitation, Arthritis, CAD (coronary artery disease), Depression, Diabetes mellitus without complication ([place]), Diastolic dysfunction, ED (erectile dysfunction), Elevated troponin, GERD (gastroesophageal reflux disease), Glaucoma, Hyperlipidemia, Hypertension, Iron deficiency anemia due to chronic blood loss, Left pontine stroke w/ cerebrovascular disease([facility]), Lumbago, and Prostate cancer ([place]).  PSH:    Past Surgical History:  Procedure Laterality Date  .  CARPAL TUNNEL RELEASE    . REPLACEMENT TOTAL KNEE BILATERAL      Current Outpatient Medications  Medication Sig Dispense Refill  . acetaminophen  (TYLENOL 8 HOUR ARTHRITIS PAIN) 650 MG CR tablet Take 650 mg by mouth every 8 (eight) hours as needed for pain.    [name] Kitchen apixaban (ELIQUIS) 5 MG TABS tablet Take 1 tablet (5 mg total) by mouth 2 (two) times daily. 180 tablet 3  . atorvastatin (LIPITOR) 20 MG tablet Take 1 tablet (20 mg total) by mouth daily. 90 tablet 1  . blood glucose meter kit and supplies KIT Dispense based on patient and insurance preference. Use up to four times daily as directed. (FOR ICD-9 250.00, 250.01). 1 each 0  . Blood Glucose Monitoring Suppl (ACCU-CHEK AVIVA PLUS) w/Device KIT Use to check sugar levels x 2 daily 1 kit 0  . carbidopa-levodopa (SINEMET IR) 25-100 MG tablet Take 0.5 tablets by mouth 3 (three) times daily.     . digoxin (LANOXIN) 0.125 MG tablet Take 1 tablet (0.125 mg total) by mouth every other day. 90 tablet 0  . docusate sodium (COLACE) 100 MG capsule Take 1 capsule (100 mg total) by mouth 2 (two) times daily as needed for mild constipation. 60 capsule 1  . DULoxetine (CYMBALTA) 60 MG capsule Take 1 capsule (60 mg total) by mouth daily. 90 capsule 1  . empagliflozin (JARDIANCE) 25 MG TABS tablet Take 1 tablet (25 mg total) by mouth daily before breakfast. 90 tablet 1  . furosemide (LASIX) 20 MG tablet Take 1.5 tablets (30 mg) by mouth once daily (Patient taking differently: 20 mg. Take 1.5 tablets (30 mg) by mouth once daily) 45 tablet 3  . gabapentin (NEURONTIN) 100 MG capsule Take 1 capsule (100 mg total) by mouth at bedtime. 90 capsule 1  . hydrALAZINE (APRESOLINE) 50 MG tablet Take 1 tablet (50 mg total) by mouth 3 (three) times daily. 270 tablet 1  . Lancets (ACCU-CHEK SOFT TOUCH) lancets Use as instructed 100 each 12  . latanoprost (XALATAN) 0.005 % ophthalmic solution Place 1 drop into both eyes at bedtime.     . Magnesium Oxide 400 (240 Mg) MG TABS Take 1 tablet (400 mg total) by mouth 2 (two) times daily. 60 tablet 5  . metFORMIN (GLUCOPHAGE) 500 MG tablet Take 1 tablet (500 mg total) by mouth 2  (two) times daily with a meal. 180 tablet 1  . naproxen sodium (ALEVE) 220 MG tablet Take 220 mg by mouth 2 (two) times daily as needed.     [name] ULTRA test strip USE TO CHECK SUGAR LEVELS TWICE DAILY. 100 each 0  . pantoprazole (PROTONIX) 40 MG tablet Take 1 tablet (40 mg total) by mouth daily. 90 tablet 1  . Polyethylene Glycol 3350 (MIRALAX PO) Take by mouth as needed.    . propranolol ER (INDERAL LA) 120 MG 24 hr capsule Take 1 capsule (120 mg total) by mouth daily. 30 capsule 1  . tamsulosin (FLOMAX) 0.4 MG CAPS capsule Take 1 capsule (0.4 mg total) by mouth daily. 90 capsule 1   No current facility-administered medications for this visit.     Allergies:   Amlodipine, Fluoxetine, and Meloxicam   Social History:  The patient  reports that he quit smoking about 25 years ago. He has never used smokeless tobacco. He reports current alcohol use of about 1.0 standard drink of alcohol per week. He reports that he does not use drugs.   Family History:   family  history includes Hypertension in his father and mother; Stroke in his mother and sister.    Review of Systems: Review of Systems  Constitutional: Negative.   Respiratory: Positive for shortness of breath.   Cardiovascular: Negative.   Gastrointestinal: Negative.   Musculoskeletal: Negative.        Gait instability  Neurological: Positive for tremors.  Psychiatric/Behavioral: Negative.   All other systems reviewed and are negative.   PHYSICAL EXAM: VS:  BP 110/70 (BP Location: Left Arm, Patient Position: Sitting, Cuff Size: Normal)   Pulse 72   Ht 5' 8"  (1.727 m)   Wt 182 lb (82.6 kg)   BMI 27.67 kg/m  , BMI Body mass index is 27.67 kg/m.  Constitutional:  oriented to person, place, and time. No distress.  HENT:  Head: Grossly normal Eyes:  no discharge. No scleral icterus.  Neck: No JVD, no carotid bruits  Cardiovascular: Regular rate and rhythm, no murmurs appreciated Pulmonary/Chest: Clear to auscultation  bilaterally, no wheezes or rails Abdominal: Soft.  no distension.  no tenderness.  Musculoskeletal: Normal range of motion Neurological:  normal muscle tone. Coordination normal. No atrophy Skin: Skin warm and dry Psychiatric: normal affect, pleasant   Recent Labs: 02/16/2020: ALT 5; BUN 28; Creatinine, Ser 1.23; Hemoglobin 10.9; Platelets 230; Potassium 4.4; Sodium 140; TSH 0.843    Lipid Panel Lab Results  Component Value Date   CHOL 137 02/16/2020   HDL 41 02/16/2020   LDLCALC 59 02/16/2020   TRIG 231 (H) 02/16/2020      Wt Readings from Last 3 Encounters:  03/15/20 182 lb (82.6 kg)  02/16/20 172 lb (78 kg)  11/25/19 169 lb 4 oz (76.8 kg)       ASSESSMENT AND PLAN:  Atrial fibrillation/flutter Atrial fibrillation noted 2020, appears to be maintaining normal sinus rhythm since that time -Tolerating Eliquis, beta-blocker, no changes to his medications  Essential hypertension Blood pressure is well controlled on today's visit. No changes made to the medications.  Diabetes mellitus without complication ([place]) Managed by primary care Hemoglobin A1c 7.0  Imbalance Prior history of falls, none recently  Anxiety Family reports this has been well controlled   Total encounter time more than 25 minutes  Greater than 50% was spent in counseling and coordination of care with the patient    Orders Placed This Encounter  Procedures  . EKG 12-Lead     Signed, Esmond Plants, M.D., Ph.D. 03/15/2020  [place], [place]

## 2020-03-15 ENCOUNTER — Other Ambulatory Visit: Payer: Self-pay

## 2020-03-15 ENCOUNTER — Other Ambulatory Visit: Payer: Self-pay | Admitting: Family Medicine

## 2020-03-15 ENCOUNTER — Encounter: Payer: Self-pay | Admitting: Cardiovascular Disease

## 2020-03-15 ENCOUNTER — Ambulatory Visit: Payer: PPO | Admitting: Cardiovascular Disease

## 2020-03-15 VITALS — BP 110/70 | HR 72 | Ht 68.0 in | Wt 182.0 lb

## 2020-03-15 DIAGNOSIS — E119 Type 2 diabetes mellitus without complications: Secondary | ICD-10-CM

## 2020-03-15 DIAGNOSIS — I480 Paroxysmal atrial fibrillation: Secondary | ICD-10-CM

## 2020-03-15 DIAGNOSIS — I5020 Unspecified systolic (congestive) heart failure: Secondary | ICD-10-CM | POA: Diagnosis not present

## 2020-03-15 DIAGNOSIS — I2510 Atherosclerotic heart disease of native coronary artery without angina pectoris: Secondary | ICD-10-CM | POA: Diagnosis not present

## 2020-03-15 DIAGNOSIS — R0602 Shortness of breath: Secondary | ICD-10-CM | POA: Diagnosis not present

## 2020-03-15 DIAGNOSIS — I10 Essential (primary) hypertension: Secondary | ICD-10-CM | POA: Diagnosis not present

## 2020-03-15 NOTE — Patient Instructions (Signed)
Medication Instructions:  No changes  If you need a refill on your cardiac medications before your next appointment, please call your pharmacy.    Lab work: No new labs needed   If you have labs (blood work) drawn today and your tests are completely normal, you will receive your results only by: . MyChart Message (if you have MyChart) OR . A paper copy in the mail If you have any lab test that is abnormal or we need to change your treatment, we will call you to review the results.   Testing/Procedures: No new testing needed   Follow-Up: At [facility], you and your health needs are our priority.  As part of our continuing mission to provide you with exceptional heart care, we have created designated Provider Care Teams.  These Care Teams include your primary Cardiologist (physician) and Advanced Practice Providers (APPs -  Physician Assistants and Nurse Practitioners) who all work together to provide you with the care you need, when you need it.  . You will need a follow up appointment in 12 months  . Providers on your designated Care Team:   . Christopher Berge, NP . Ryan Dunn, PA-C . Jacquelyn Visser, PA-C  Any Other Special Instructions Will Be Listed Below (If Applicable).  COVID-19 Vaccine Information can be found at: [email or web address] For questions related to vaccine distribution or appointments, please email [email or web address] or call [phone].     

## 2020-03-15 NOTE — Telephone Encounter (Signed)
Re routing to pt's cardiologist that last prescribed med

## 2020-03-18 ENCOUNTER — Telehealth: Payer: Self-pay | Admitting: Pharmacist

## 2020-03-18 NOTE — Progress Notes (Signed)
Chronic Care Management Pharmacy Assistant   Name: Todd [name]  MRN: [id number] DOB: 16-Apr-1931  Reason for Encounter: Patient Assistance Coordination    PCP : Valerie Roys, DO  Allergies:   Allergies  Allergen Reactions   Amlodipine     Lower extremity swelling at 10 mg.   Fluoxetine Other (See Comments)    headache   Meloxicam Other (See Comments)    Other reaction(s): Dizziness Nervousness.     Medications: Outpatient Encounter Medications as of 03/18/2020  Medication Sig Note   acetaminophen (TYLENOL 8 HOUR ARTHRITIS PAIN) 650 MG CR tablet Take 650 mg by mouth every 8 (eight) hours as needed for pain.    apixaban (ELIQUIS) 5 MG TABS tablet Take 1 tablet (5 mg total) by mouth 2 (two) times daily.    atorvastatin (LIPITOR) 20 MG tablet Take 1 tablet (20 mg total) by mouth daily.    blood glucose meter kit and supplies KIT Dispense based on patient and insurance preference. Use up to four times daily as directed. (FOR ICD-9 250.00, 250.01).    Blood Glucose Monitoring Suppl (ACCU-CHEK AVIVA PLUS) w/Device KIT Use to check sugar levels x 2 daily    carbidopa-levodopa (SINEMET IR) 25-100 MG tablet Take 0.5 tablets by mouth 3 (three) times daily.     digoxin (LANOXIN) 0.125 MG tablet Take 1 tablet (0.125 mg total) by mouth every other day.    docusate sodium (COLACE) 100 MG capsule Take 1 capsule (100 mg total) by mouth 2 (two) times daily as needed for mild constipation.    DULoxetine (CYMBALTA) 60 MG capsule Take 1 capsule (60 mg total) by mouth daily.    empagliflozin (JARDIANCE) 25 MG TABS tablet Take 1 tablet (25 mg total) by mouth daily before breakfast.    furosemide (LASIX) 20 MG tablet Take 1.5 tablets (30 mg) by mouth once daily (Patient taking differently: 20 mg. Take 1.5 tablets (30 mg) by mouth once daily) 03/01/2020: Taking 1.5 tabs per daughter [name]   gabapentin (NEURONTIN) 100 MG capsule Take 1 capsule (100 mg total) by mouth at bedtime.     hydrALAZINE (APRESOLINE) 50 MG tablet Take 1 tablet (50 mg total) by mouth 3 (three) times daily.    Lancets (ACCU-CHEK SOFT TOUCH) lancets Use as instructed    latanoprost (XALATAN) 0.005 % ophthalmic solution Place 1 drop into both eyes at bedtime.     Magnesium Oxide 400 (240 Mg) MG TABS Take 1 tablet (400 mg total) by mouth 2 (two) times daily.    metFORMIN (GLUCOPHAGE) 500 MG tablet Take 1 tablet (500 mg total) by mouth 2 (two) times daily with a meal.    naproxen sodium (ALEVE) 220 MG tablet Take 220 mg by mouth 2 (two) times daily as needed.  08/07/2017: Patient reports taking at 2 to 3 tablets a day.    ONETOUCH ULTRA test strip USE TO CHECK SUGAR LEVELS TWICE DAILY.    pantoprazole (PROTONIX) 40 MG tablet Take 1 tablet (40 mg total) by mouth daily.    Polyethylene Glycol 3350 (MIRALAX PO) Take by mouth as needed.    propranolol ER (INDERAL LA) 120 MG 24 hr capsule Take 1 capsule (120 mg total) by mouth daily.    tamsulosin (FLOMAX) 0.4 MG CAPS capsule Take 1 capsule (0.4 mg total) by mouth daily.    No facility-administered encounter medications on file as of 03/18/2020.    Current Diagnosis: Patient Active Problem List   Diagnosis Date Noted   Atrial fibrillation  with rapid ventricular response ([place]) 11/11/2018   Frequent PVCs    Anemia due to stage 3 chronic kidney disease ([place]) 09/16/2018   Normocytic anemia 05/17/2018   Hemiplegia ([place]) 04/25/2018   B12 deficiency 04/25/2018   Dysphagia as late effect of cerebrovascular accident (CVA) 04/25/2018   Depression, major, single episode, severe ([place]) 04/25/2018   CHF (congestive heart failure) (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 11/09/2015   Knee joint  replaced by other means 07/04/2015   Urinary incontinence 04/27/2015   Anxiety, generalized 04/07/2015   History of BCG vaccination 04/07/2015   GERD (gastroesophageal reflux disease) 03/04/2015   Degeneration of intervertebral disc of lumbar region 02/23/2015   Neuritis or radiculitis due to rupture of lumbar intervertebral disc 02/23/2015   Lumbar stenosis with neurogenic claudication 02/23/2015   Hypertension 12/02/2014   Type 2 diabetes mellitus with peripheral neuropathy ([place]) 12/02/2014   Benign essential tremor 03/13/2014   Imbalance 03/13/2014   Prostate cancer ([place]) 10/07/2012   ED (erectile dysfunction) of organic origin 02/09/2012   Patient assistance forms initiated for Jardiance through [employer]. Application forms filled for [place] and will be mailed today 03/18/20. Informed patient's daughter [name] to expect forms in the mail for patient to sign and send back to Dr.Johnson (PCP) for completion. Patient's daughter verbalized understanding and stated she will drop off forms back to Dr. Durenda Age office once it has been signed. Notified [name], CPP.    Raynelle Highland, [place] Assistant [phone]  Follow-Up:  CPA to follow up with patient.

## 2020-03-22 ENCOUNTER — Other Ambulatory Visit: Payer: Self-pay | Admitting: Cardiovascular Disease

## 2020-03-22 NOTE — Telephone Encounter (Signed)
Rx'd at previously recommended dose of 0.125mg  QOD. Does not appear directions were updated to pharmacy when medication change though he was taking QOD when seen by Dr. Rockey Situ 03/2020. No signs of digoxin toxicity at that time per documentation. Will plan for repeat digoxin level at his next cardiology follow up or with sooner lab work with PCP.

## 2020-03-22 NOTE — Telephone Encounter (Signed)
Rerouting to office staff, given hospital status at this time and time since last seen patient.

## 2020-04-02 NOTE — Progress Notes (Signed)
Open in Error.   Todd [name], [place] Assistant [phone]

## 2020-04-05 ENCOUNTER — Telehealth: Payer: Self-pay

## 2020-04-05 NOTE — Telephone Encounter (Signed)
Have they signed the patient assistance forms? I don't want to put him on something like glypizide as it can drop his sugars. This should work and is less expensive than insulin or an injectable.

## 2020-04-05 NOTE — Telephone Encounter (Signed)
Copied from [place] [phone]. Topic: General - Other >> Apr 05, 2020 11:31 AM Leward Quan A wrote: Reason for CRM: Patient daughter Sharion Dove called in to speak to Dr Wynetta Emery about her fathers medication and the amount of money that it is costing to fill this Rx for empagliflozin (JARDIANCE) 25 MG TABS tablet asking for a call back to discuss a different alternative. Please call Ph# [phone]

## 2020-04-06 ENCOUNTER — Telehealth: Payer: Self-pay | Admitting: Pharmacist

## 2020-04-06 NOTE — Telephone Encounter (Signed)
Forms mailed on 03/18/20 for PAP. Unable to contact at number listed for daughter above. Please get alternate if she calls back.

## 2020-04-06 NOTE — Chronic Care Management (AMB) (Signed)
Chronic Care Management Pharmacy Assistant   Name: Todd [name]  MRN: [id number] DOB: 06/10/1930  Reason for Encounter: Patient Assistance Coordination/Follow-up   PCP : Todd Roys, DO   Allergies:   Allergies  Allergen Reactions  . Amlodipine     Lower extremity swelling at 10 mg.  . Fluoxetine Other (See Comments)    headache  . Meloxicam Other (See Comments)    Other reaction(s): Dizziness Nervousness.     Medications: Outpatient Encounter Medications as of 04/06/2020  Medication Sig Note  . acetaminophen (TYLENOL 8 HOUR ARTHRITIS PAIN) 650 MG CR tablet Take 650 mg by mouth every 8 (eight) hours as needed for pain.   [name] Kitchen apixaban (ELIQUIS) 5 MG TABS tablet Take 1 tablet (5 mg total) by mouth 2 (two) times daily.   [name] Kitchen atorvastatin (LIPITOR) 20 MG tablet Take 1 tablet (20 mg total) by mouth daily.   . blood glucose meter kit and supplies KIT Dispense based on patient and insurance preference. Use up to four times daily as directed. (FOR ICD-9 250.00, 250.01).   . Blood Glucose Monitoring Suppl (ACCU-CHEK AVIVA PLUS) w/Device KIT Use to check sugar levels x 2 daily   . carbidopa-levodopa (SINEMET IR) 25-100 MG tablet Take 0.5 tablets by mouth 3 (three) times daily.    . digoxin (LANOXIN) 0.125 MG tablet Take 1 tablet (0.125 mg total) by mouth every other day.   . docusate sodium (COLACE) 100 MG capsule Take 1 capsule (100 mg total) by mouth 2 (two) times daily as needed for mild constipation.   . DULoxetine (CYMBALTA) 60 MG capsule Take 1 capsule (60 mg total) by mouth daily.   . empagliflozin (JARDIANCE) 25 MG TABS tablet Take 1 tablet (25 mg total) by mouth daily before breakfast.   . furosemide (LASIX) 20 MG tablet Take 1.5 tablets (30 mg) by mouth once daily (Patient taking differently: 20 mg. Take 1.5 tablets (30 mg) by mouth once daily) 03/01/2020: Taking 1.5 tabs per daughter Todd [name]  . gabapentin (NEURONTIN) 100 MG capsule Take 1 capsule (100 mg total) by mouth at  bedtime.   . hydrALAZINE (APRESOLINE) 50 MG tablet Take 1 tablet (50 mg total) by mouth 3 (three) times daily.   . Lancets (ACCU-CHEK SOFT TOUCH) lancets Use as instructed   . latanoprost (XALATAN) 0.005 % ophthalmic solution Place 1 drop into both eyes at bedtime.    . Magnesium Oxide 400 (240 Mg) MG TABS Take 1 tablet (400 mg total) by mouth 2 (two) times daily.   . metFORMIN (GLUCOPHAGE) 500 MG tablet Take 1 tablet (500 mg total) by mouth 2 (two) times daily with a meal.   . naproxen sodium (ALEVE) 220 MG tablet Take 220 mg by mouth 2 (two) times daily as needed.  08/07/2017: Patient reports taking at 2 to 3 tablets a day.   [name] ULTRA test strip USE TO CHECK SUGAR LEVELS TWICE DAILY.   . pantoprazole (PROTONIX) 40 MG tablet Take 1 tablet (40 mg total) by mouth daily.   . Polyethylene Glycol 3350 (MIRALAX PO) Take by mouth as needed.   . propranolol ER (INDERAL LA) 120 MG 24 hr capsule Take 1 capsule (120 mg total) by mouth daily.   . tamsulosin (FLOMAX) 0.4 MG CAPS capsule Take 1 capsule (0.4 mg total) by mouth daily.    No facility-administered encounter medications on file as of 04/06/2020.    Current Diagnosis: Patient Active Problem List   Diagnosis Date Noted  . Atrial fibrillation  with rapid ventricular response ([place]) 11/11/2018  . Frequent PVCs   . Anemia due to stage 3 chronic kidney disease ([place]) 09/16/2018  . Normocytic anemia 05/17/2018  . Hemiplegia ([place]) 04/25/2018  . B12 deficiency 04/25/2018  . Dysphagia as late effect of cerebrovascular accident (CVA) 04/25/2018  . Depression, major, single episode, severe ([place]) 04/25/2018  . CHF (congestive heart failure) ([place]) 01/09/2018  . Advanced care planning/counseling discussion 12/20/2017  . Hypoxia 11/02/2017  . Mixed hyperlipidemia 09/21/2017  . Aphasia 05/18/2017  . CAD (coronary artery disease) 05/18/2017  . History of stroke with residual deficit 05/18/2017  . 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  Knee joint replaced by other means 07/04/2015  . Urinary incontinence 04/27/2015  . Anxiety, generalized 04/07/2015  . History of BCG vaccination 04/07/2015  . GERD (gastroesophageal reflux disease) 03/04/2015  . Degeneration of intervertebral disc of lumbar region 02/23/2015  . Neuritis or radiculitis due to rupture of lumbar intervertebral disc 02/23/2015  . Lumbar stenosis with neurogenic claudication 02/23/2015  . Hypertension 12/02/2014  . Type 2 diabetes mellitus with peripheral neuropathy ([place]) 12/02/2014  . Benign essential tremor 03/13/2014  . Imbalance 03/13/2014  . Prostate cancer ([place]) 10/07/2012  . ED (erectile dysfunction) of organic origin 02/09/2012    Goals Addressed   None    04/06/20- CPA attempted to contact the patient's daughter (Ms.Todd [name]) today to see if she received and has completed the paperwork for jardiance PAP mailed 03/18/20. Per [employer] phone recording; number can not be completed as dialed. Several unsuccessful attempts have been initiated to patient's home and daughter. Will notify Todd [name], CPP.   Follow-Up:  Patient Assistance Coordination and Pharmacist Review-CPA will follow up with Todd [name], CPP for further instructions.

## 2020-04-06 NOTE — Progress Notes (Addendum)
Attempted to follow up with patient's daughter to see if they have received the patient assistance application that was mailed to their address on 03-18-20.Left voicemail on home number. CPP [name], [facility] made aware of attempts.   Cloretta Ned, LPN Clinical Pharmacist Assistant  [phone]

## 2020-04-08 DIAGNOSIS — B351 Tinea unguium: Secondary | ICD-10-CM | POA: Diagnosis not present

## 2020-04-08 DIAGNOSIS — E1140 Type 2 diabetes mellitus with diabetic neuropathy, unspecified: Secondary | ICD-10-CM | POA: Diagnosis not present

## 2020-04-09 NOTE — Telephone Encounter (Signed)
PTs daughter requesting assistance to field out the form she receive in the mail / please advise / [name] [phone]

## 2020-04-11 DIAGNOSIS — I501 Left ventricular failure: Secondary | ICD-10-CM | POA: Diagnosis not present

## 2020-04-11 DIAGNOSIS — I2510 Atherosclerotic heart disease of native coronary artery without angina pectoris: Secondary | ICD-10-CM | POA: Diagnosis not present

## 2020-04-11 DIAGNOSIS — R0902 Hypoxemia: Secondary | ICD-10-CM | POA: Diagnosis not present

## 2020-04-13 NOTE — Telephone Encounter (Signed)
Ardeen Fillers pt's daughter came in she states that the number is correct her phone was stolen but she has a new phone same number. She also states that the pt would not be able to get assistance due to his income. She also states that a couple of years ago [place] at [place] [address] put pt on glypizide and it work fine for her dad. Please advise.

## 2020-04-20 ENCOUNTER — Telehealth: Payer: Self-pay

## 2020-04-20 NOTE — Telephone Encounter (Signed)
Copied from [place] [phone]. Topic: General - Other >> Apr 20, 2020  4:07 PM Keene Breath wrote: Reason for CRM: Patient's daughter Ardeen Fillers) called to speak with the nurse or doctor regarding patient's medication for empagliflozin (JARDIANCE) 25 MG TABS tablet.  She stated that he has changed insurance and now they will cover this medication.  Please call her to discuss further at [phone]

## 2020-04-21 NOTE — Telephone Encounter (Signed)
Called and spoke with the patient's daughter. She states that she just wanted to let us know that the new insurance her dad is going to have will be covering the [place] prescription. Just wanted to let us know. Routing to provider and pharmacist as an Pharmacist, hospital.

## 2020-05-03 ENCOUNTER — Other Ambulatory Visit: Payer: Self-pay | Admitting: Family Medicine

## 2020-05-03 DIAGNOSIS — C61 Malignant neoplasm of prostate: Secondary | ICD-10-CM

## 2020-05-05 IMAGING — CR DG CHEST 2V
2 series · 2 of 2 positions shown · non-contrast
Comparison: 12/17/2017

CLINICAL DATA: Weakness

EXAM:
CHEST - 2 VIEW

[chest lat]
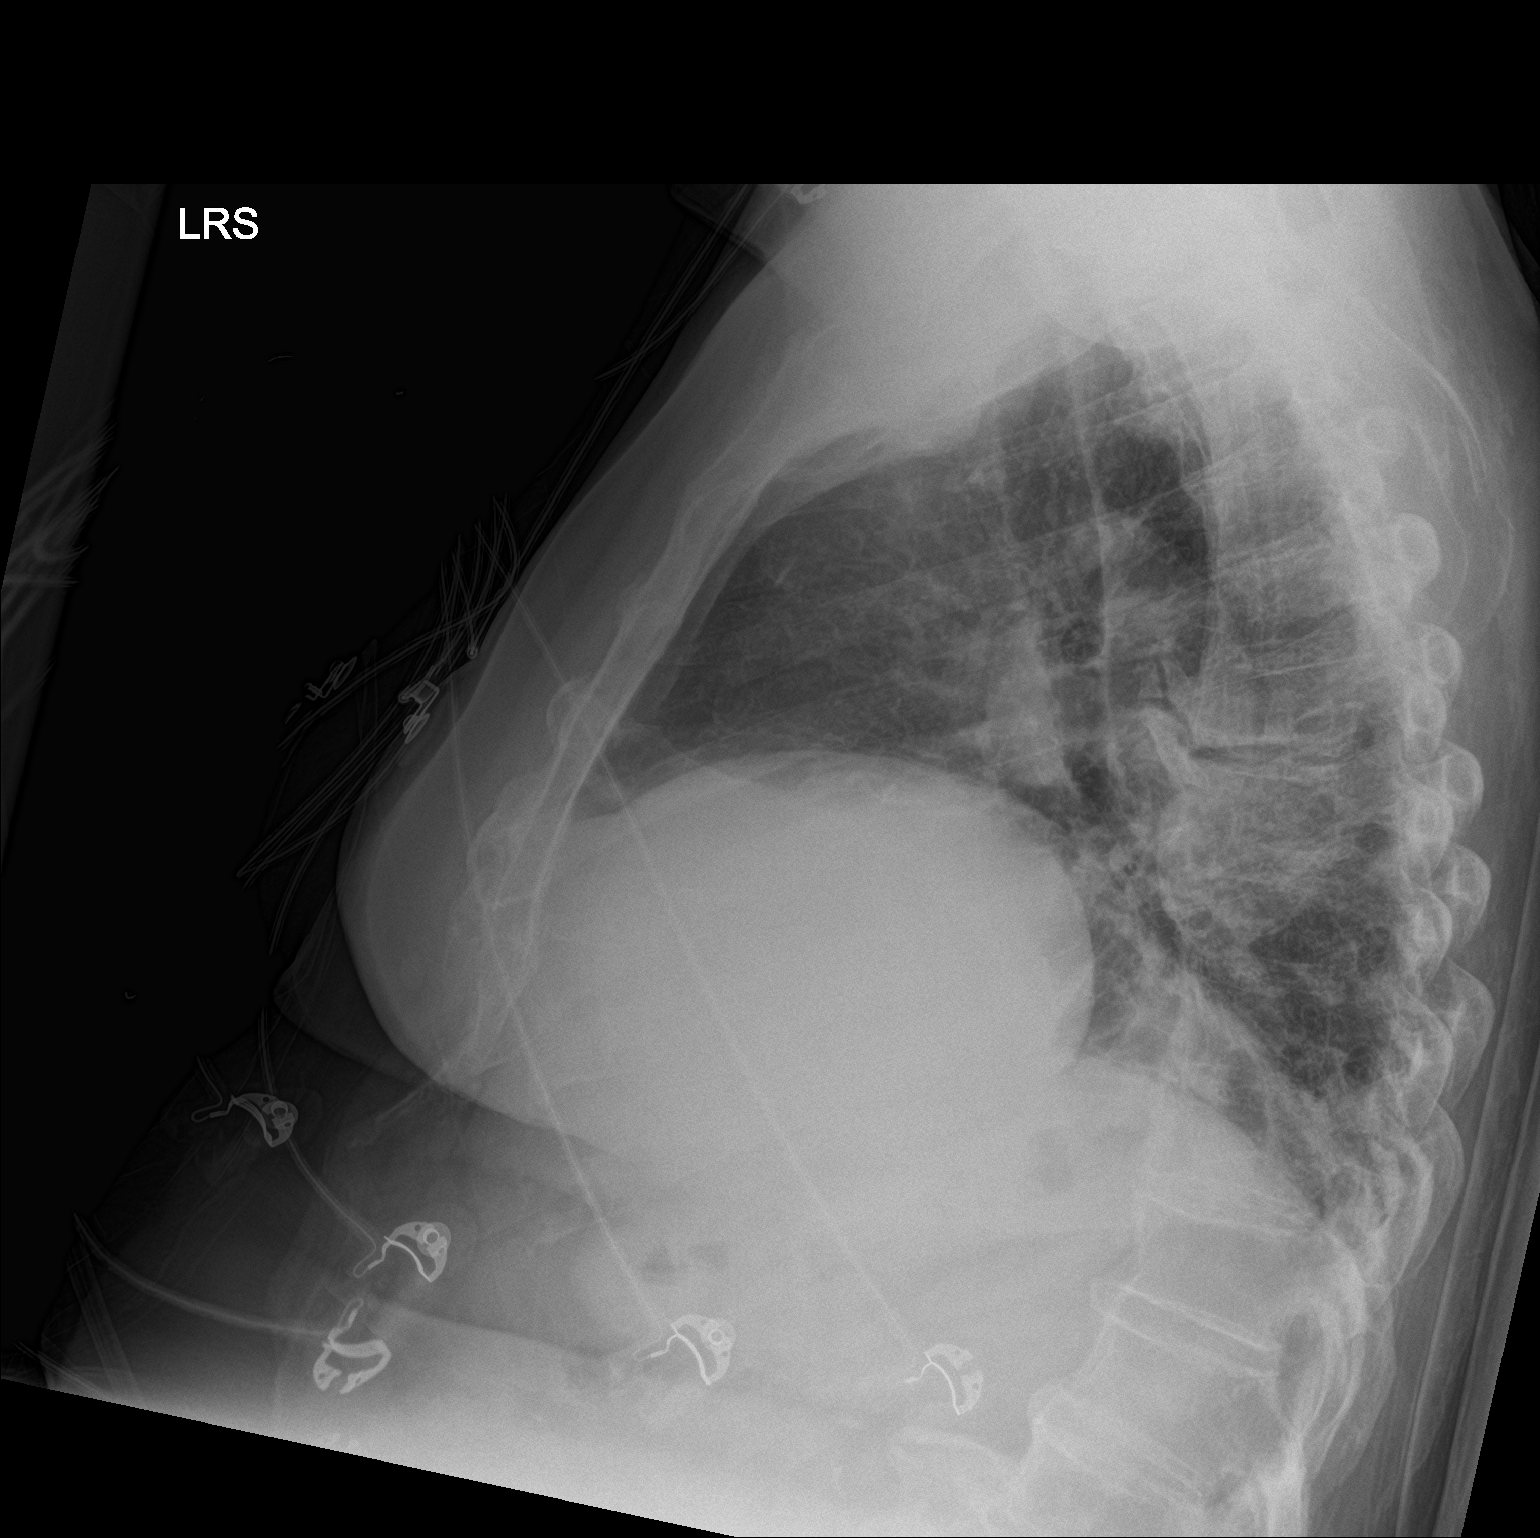

[chest ap]
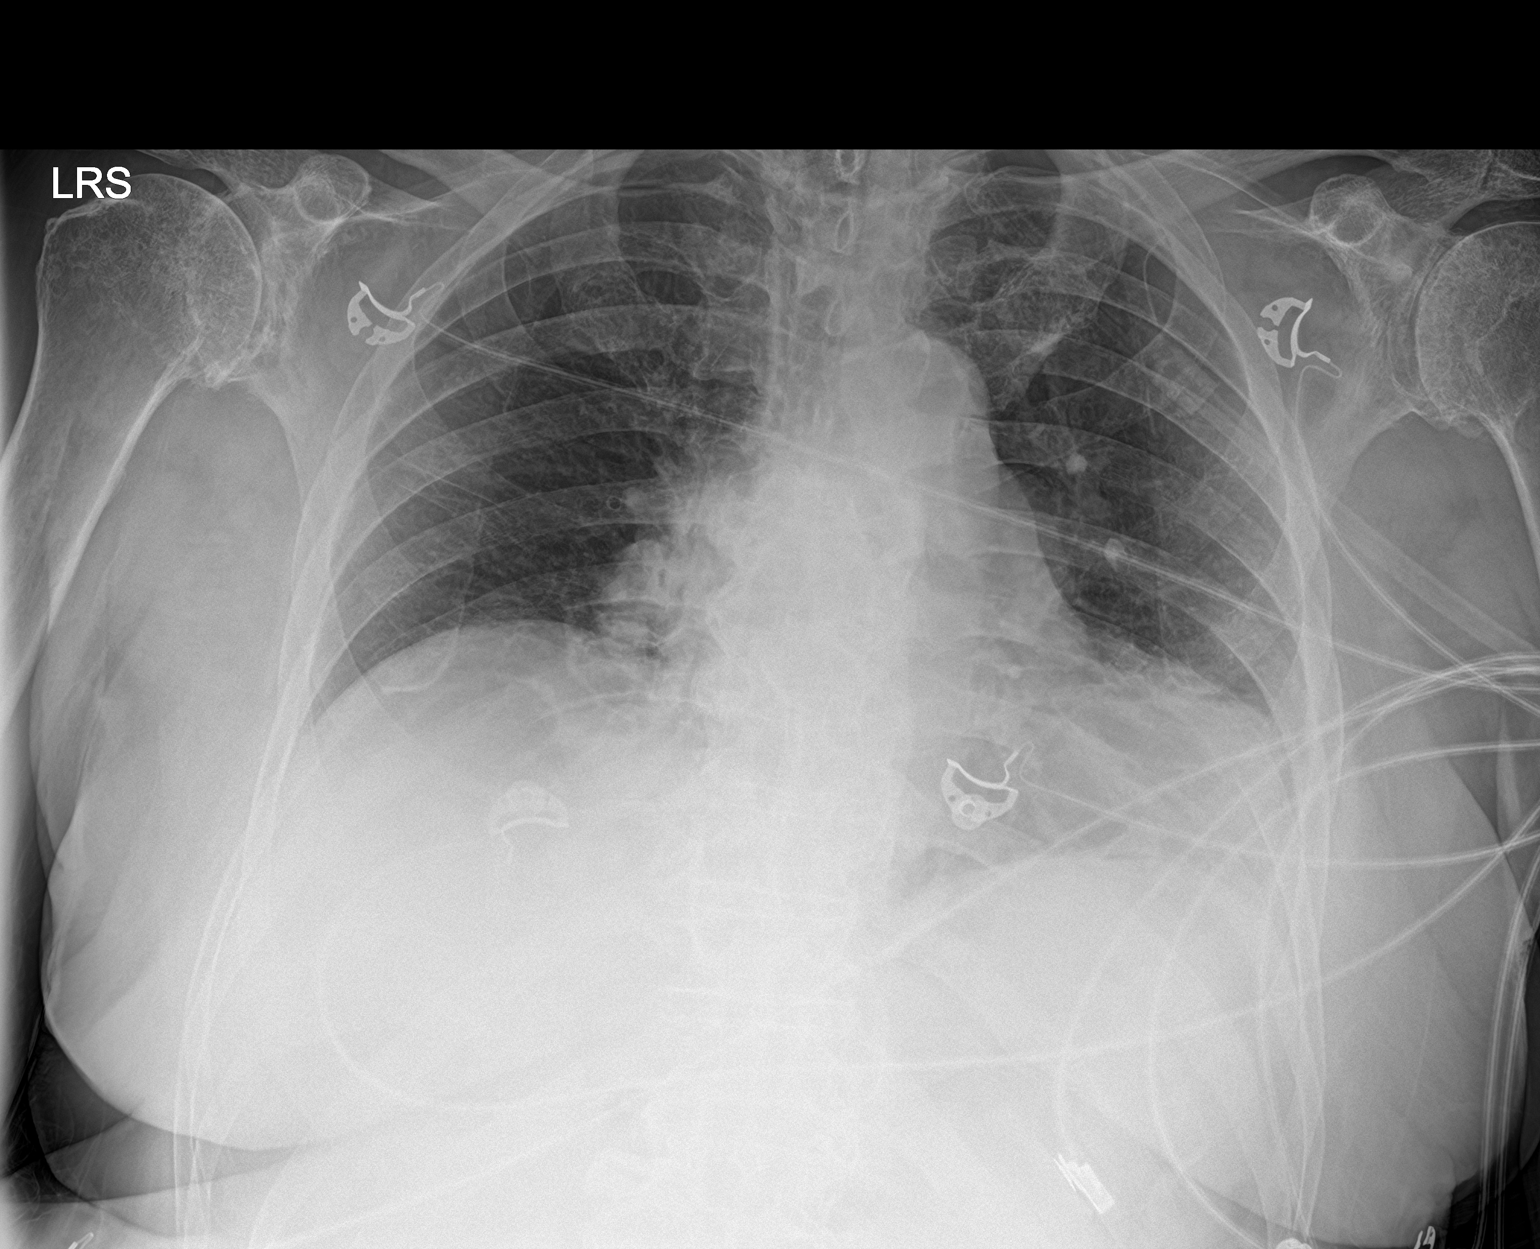

[2 of 2 positions shown; findings below may reference images not displayed]

FINDINGS: Cardiomegaly with vascular congestion. Mild elevation of the right
hemidiaphragm, stable. No edema. Bibasilar atelectasis. No visible
effusions. No acute bony abnormality.
IMPRESSION: Cardiomegaly with vascular congestion.  Bibasilar atelectasis.

## 2020-05-05 IMAGING — CT CT HEAD W/O CM
3 series · 15 of 45 positions shown, 18 images · non-contrast
Comparison: MRI brain dated 05/18/2017

CLINICAL DATA: Altered level of consciousness

EXAM:
CT HEAD WITHOUT CONTRAST
TECHNIQUE: Contiguous axial images were obtained from the base of the skull
through the vertex without intravenous contrast.

[Series 2: head wo · axial · 0.47mm/px · z∈[+430,+545]mm · 9 of 28 slices shown, 12 images]
[im 3/28  brain]
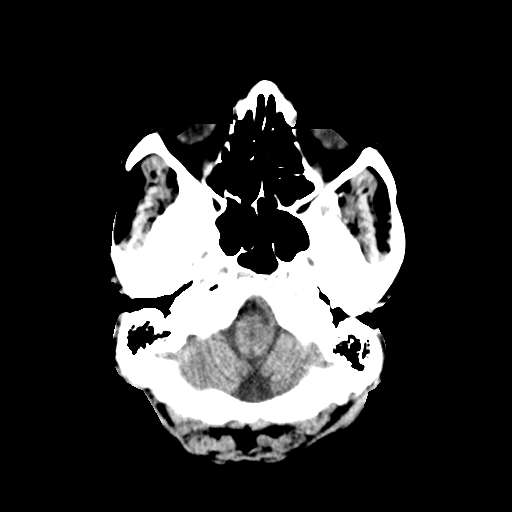
[im 3/28  bone]
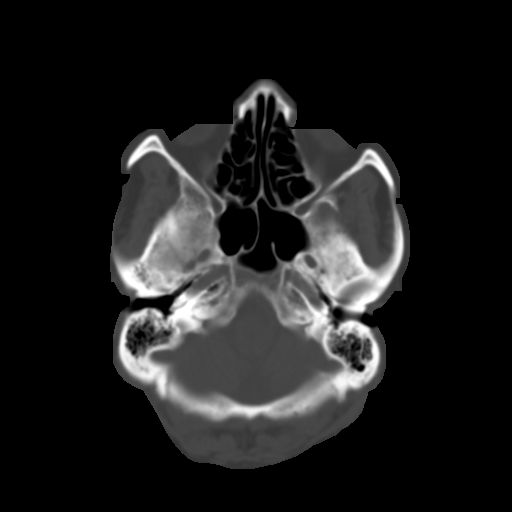
[im 6/28  brain]
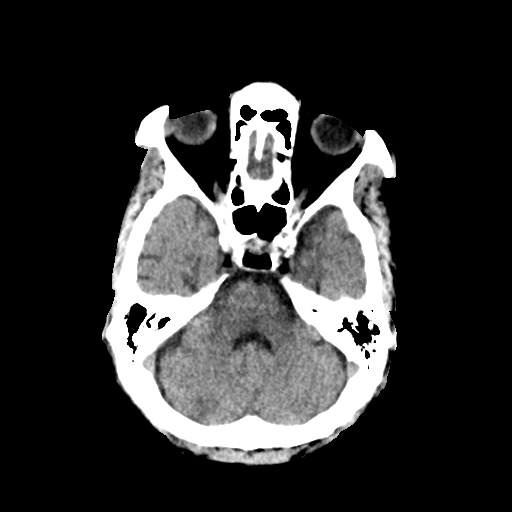
[im 9/28  brain]
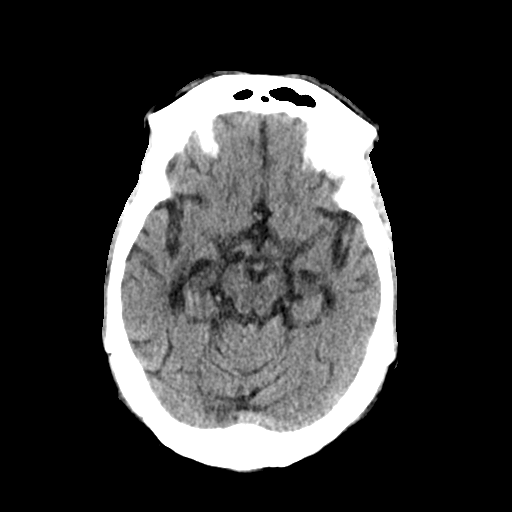
[im 12/28  brain]
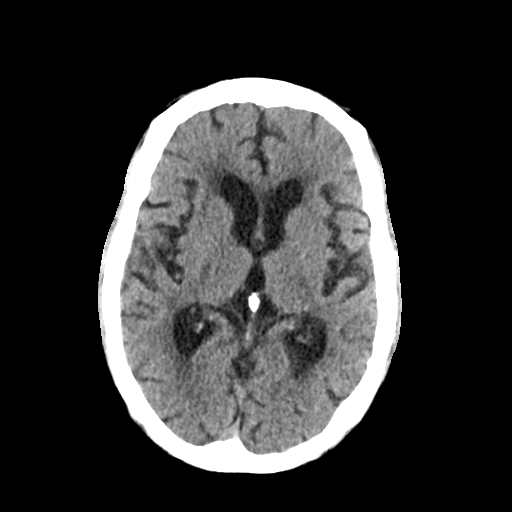
[im 15/28  brain]
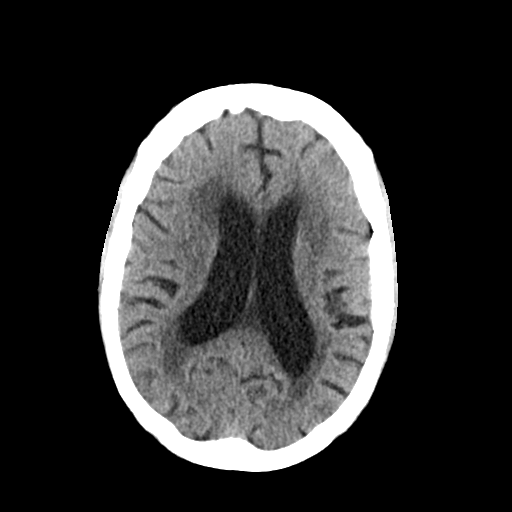
[im 15/28  bone]
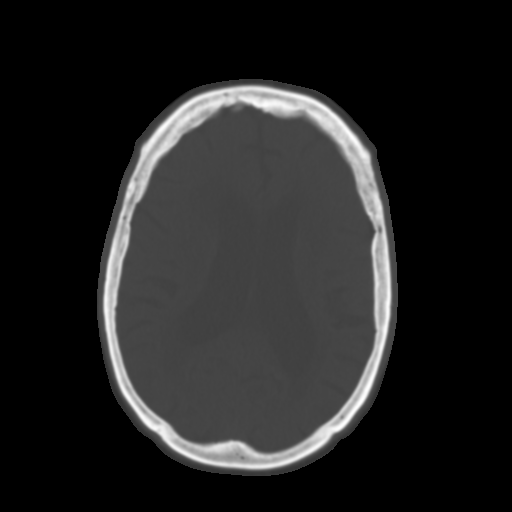
[im 17/28  brain]
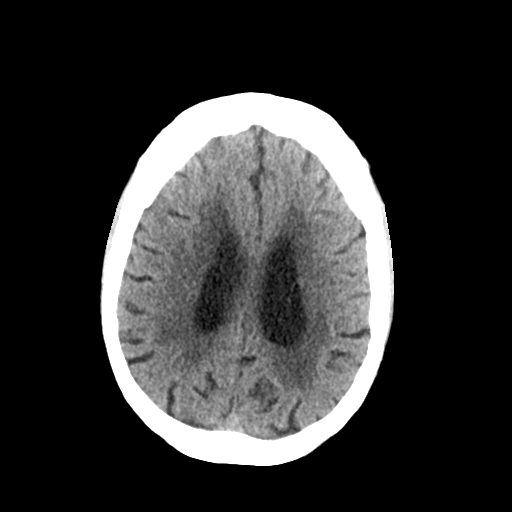
[im 20/28  brain]
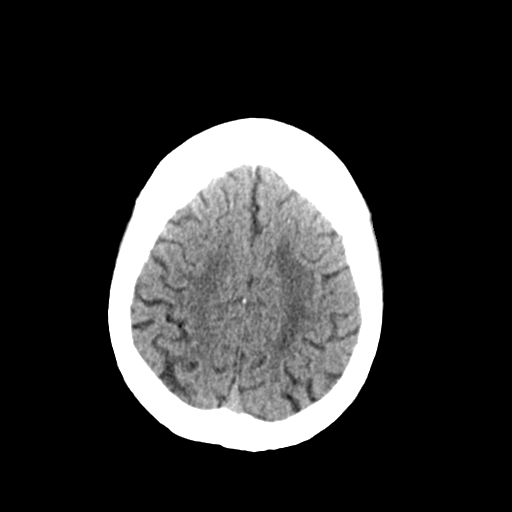
[im 23/28  brain]
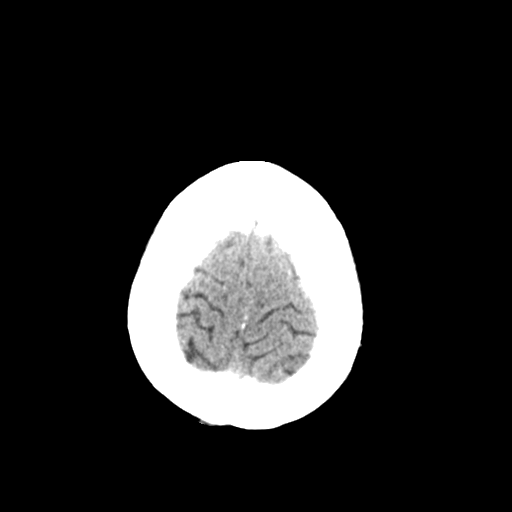
[im 26/28  brain]
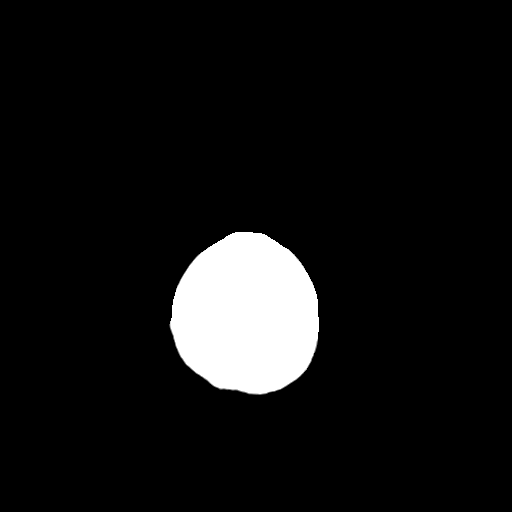
[im 26/28  bone]
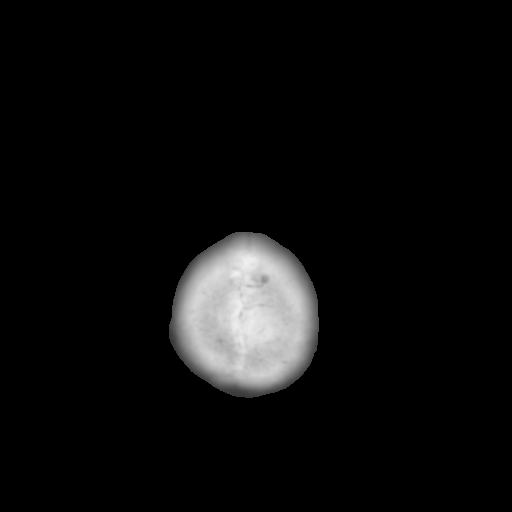

[Series 4: coronal soft tissue · coronal · 0.28mm/px · 3 of 66 slices shown]
[im 22/66  brain]
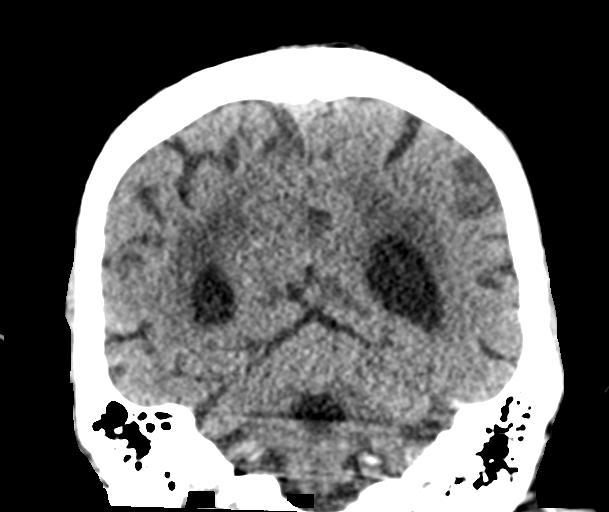
[im 29/66  brain]
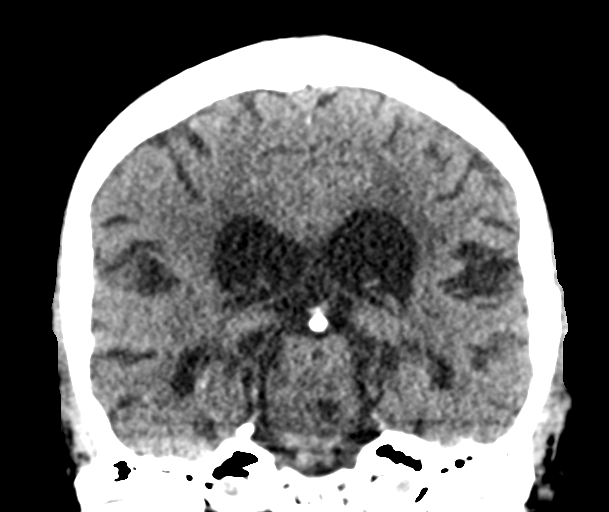
[im 37/66  brain]
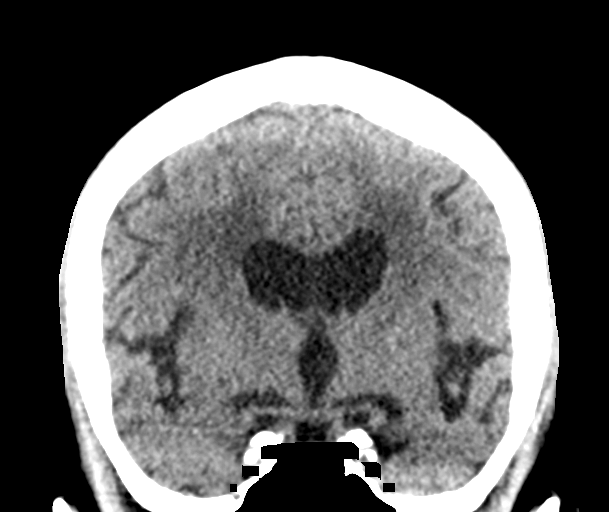

[Series 5: sagittal soft tissue · sagittal · 0.28mm/px · 3 of 52 slices shown]
[im 18/52  brain]
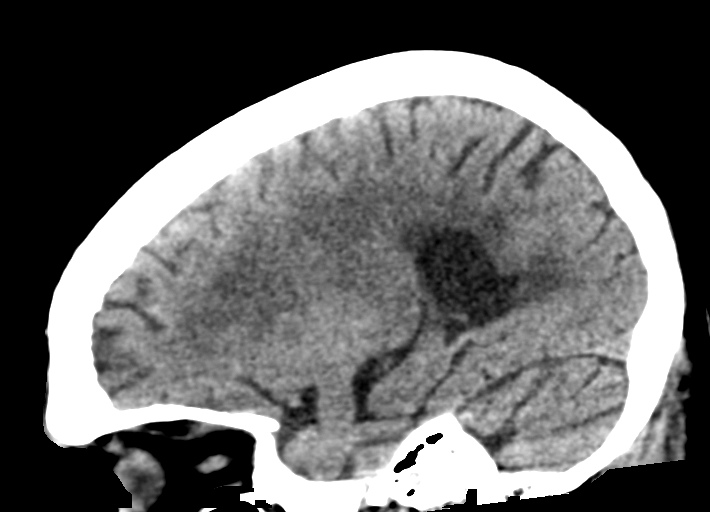
[im 26/52  brain]
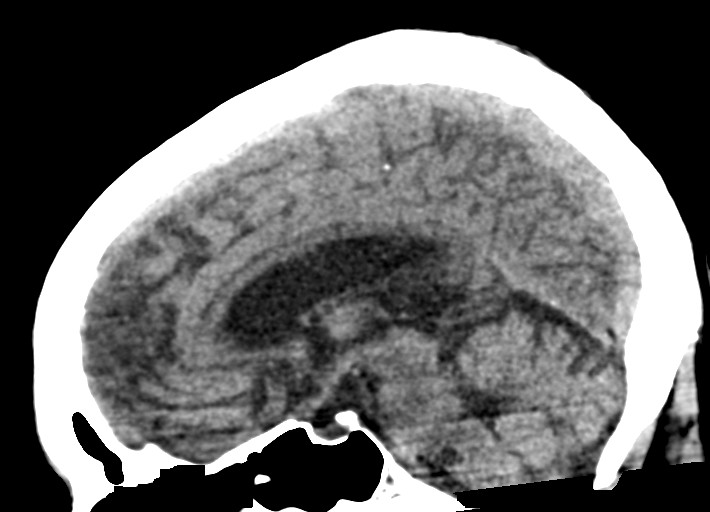
[im 35/52  brain]
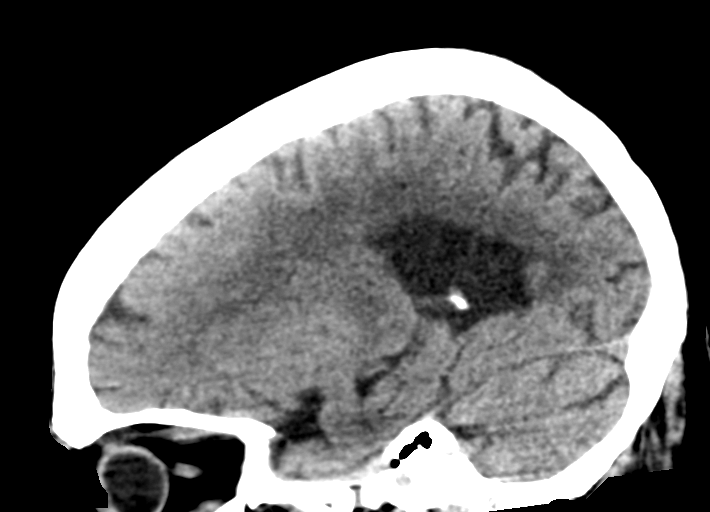

[15 of 45 positions shown; findings below may reference images not displayed]

FINDINGS: Brain: No evidence of acute infarction, hemorrhage, hydrocephalus,
extra-axial collection or mass lesion/mass effect.

Subcortical white matter and periventricular small vessel ischemic
changes.

Vascular: Intracranial atherosclerosis.

Skull: Normal. Negative for fracture or focal lesion.

Sinuses/Orbits: The visualized paranasal sinuses are essentially
clear. The mastoid air cells are unopacified.

Other: None.
IMPRESSION: No evidence of acute intracranial abnormality.

Small vessel ischemic changes.

## 2020-05-11 DIAGNOSIS — R0902 Hypoxemia: Secondary | ICD-10-CM | POA: Diagnosis not present

## 2020-05-11 DIAGNOSIS — I2510 Atherosclerotic heart disease of native coronary artery without angina pectoris: Secondary | ICD-10-CM | POA: Diagnosis not present

## 2020-05-11 DIAGNOSIS — I501 Left ventricular failure: Secondary | ICD-10-CM | POA: Diagnosis not present

## 2020-05-17 ENCOUNTER — Other Ambulatory Visit: Payer: PPO

## 2020-05-17 ENCOUNTER — Other Ambulatory Visit: Payer: Self-pay

## 2020-05-17 DIAGNOSIS — C61 Malignant neoplasm of prostate: Secondary | ICD-10-CM | POA: Diagnosis not present

## 2020-05-18 LAB — PSA: Prostate Specific Ag, Serum: 3.4 ng/mL (ref 0.0–4.0)

## 2020-05-19 ENCOUNTER — Encounter: Payer: Self-pay | Admitting: Urology

## 2020-05-19 ENCOUNTER — Ambulatory Visit (INDEPENDENT_AMBULATORY_CARE_PROVIDER_SITE_OTHER): Payer: PPO | Admitting: Urology

## 2020-05-19 ENCOUNTER — Other Ambulatory Visit: Payer: Self-pay

## 2020-05-19 VITALS — BP 120/73 | HR 76 | Ht 68.0 in | Wt 178.0 lb

## 2020-05-19 DIAGNOSIS — C61 Malignant neoplasm of prostate: Secondary | ICD-10-CM

## 2020-05-19 DIAGNOSIS — R399 Unspecified symptoms and signs involving the genitourinary system: Secondary | ICD-10-CM | POA: Diagnosis not present

## 2020-05-19 MED ORDER — TAMSULOSIN HCL 0.4 MG PO CAPS
0.4000 mg | ORAL_CAPSULE | Freq: Every day | ORAL | 3 refills | Status: DC
Start: 2020-05-19 — End: 2020-05-31

## 2020-05-19 NOTE — Progress Notes (Signed)
05/19/2020 11:39 AM   Todd [name] July 09, 1930 [id number]  Referring provider: Valerie Roys, DO [place],  [place] [address]  No chief complaint on file.   Urologic history: 1.T1c prostate cancer -Diagnosed 2002 -Primary treatment IMRT/brachytherapy -ADT was started for biochemical recurrence sometime prior to 2013 -Was initially on intermittent ablation and subsequently changed to continuous  2.Lower urinary tract symptoms -Primarily storage related voiding symptoms -Cystoscopy 04/2015 without stricture or mucosal abnormalities   HPI: 86 y.o. male presents for semiannual follow-up.   At last visit 11/2019 patient and family elected to change to intermittent androgen ablation  Last leuprolide 05/23/2019  PSA 05/17/2020 stable at 3.4  Family member with him not sure if he is still taking tamsulosin but will check with her sister  No bothersome LUTS  No new pain    PMH: Past Medical History:  Diagnosis Date  . Anxiety   . Aortic regurgitation    a. 03/2017 Echo: Mod AI; b. 05/2017 Echo: Triv AI.  [name] Kitchen Arthritis    osteoarthritis-left knee  . CAD (coronary artery disease)    a. Reported h/o cath ~ 2008 - ? small vessel dzs. No PCI performed; b. 03/2017 MV: EF 45-54%, no ischemia/infarct.  . Depression   . Diabetes mellitus without complication ([place])   . Diastolic dysfunction    a. 03/2017 Echo: EF 55-65%; b. 05/2017 Echo: EF 55-60%, no rwma, Gr1 DD. Triv AI.  Mildly dil LA.  . ED (erectile dysfunction)   . Elevated troponin    a. chronic mild elevation - 0.03->0.04.  [name] Kitchen GERD (gastroesophageal reflux disease)   . Glaucoma   . Hyperlipidemia   . Hypertension   . Iron deficiency anemia due to chronic blood loss   . Left pontine stroke w/ cerebrovascular disease([facility])    a. 05/2017 MRI/A: acute to subacute L pontine infarct. Chronic left thalamic lacunar infarct. Severe basilar artery stenosis w/ radiographic string sign of the distal 1/3. Mild to  moderate L PCA P2 segment stenosis; b. 05/2017 Carotid U/S: <50% bilat ICA stenosis.  . Lumbago    Lumbosacral Neuritis  . Prostate cancer [facility])     Surgical History: Past Surgical History:  Procedure Laterality Date  . CARPAL TUNNEL RELEASE    . REPLACEMENT TOTAL KNEE BILATERAL      Home Medications:  Allergies as of 05/19/2020      Reactions   Amlodipine    Lower extremity swelling at 10 mg.   Fluoxetine Other (See Comments)   headache   Meloxicam Other (See Comments)   Other reaction(s): Dizziness Nervousness.      Medication List       Accurate as of May 19, 2020 11:39 AM. If you have any questions, ask your nurse or doctor.        Accu-Chek Aviva Plus w/Device Kit Use to check sugar levels x 2 daily   accu-chek soft touch lancets Use as instructed   apixaban 5 MG Tabs tablet Commonly known as: Eliquis Take 1 tablet (5 mg total) by mouth 2 (two) times daily.   atorvastatin 20 MG tablet Commonly known as: LIPITOR Take 1 tablet (20 mg total) by mouth daily.   blood glucose meter kit and supplies Kit Dispense based on patient and insurance preference. Use up to four times daily as directed. (FOR ICD-9 250.00, 250.01).   carbidopa-levodopa 25-100 MG tablet Commonly known as: SINEMET IR Take 0.5 tablets by mouth 3 (three) times daily.   digoxin 0.125 MG tablet  Commonly known as: LANOXIN Take 1 tablet (0.125 mg total) by mouth every other day.   docusate sodium 100 MG capsule Commonly known as: Colace Take 1 capsule (100 mg total) by mouth 2 (two) times daily as needed for mild constipation.   DULoxetine 60 MG capsule Commonly known as: CYMBALTA Take 1 capsule (60 mg total) by mouth daily.   empagliflozin 25 MG Tabs tablet Commonly known as: Jardiance Take 1 tablet (25 mg total) by mouth daily before breakfast.   furosemide 20 MG tablet Commonly known as: LASIX Take 1.5 tablets (30 mg) by mouth once daily What changed: how much to take    gabapentin 100 MG capsule Commonly known as: NEURONTIN Take 1 capsule (100 mg total) by mouth at bedtime.   hydrALAZINE 50 MG tablet Commonly known as: APRESOLINE Take 1 tablet (50 mg total) by mouth 3 (three) times daily.   latanoprost 0.005 % ophthalmic solution Commonly known as: XALATAN Place 1 drop into both eyes at bedtime.   Magnesium Oxide 400 (240 Mg) MG Tabs Take 1 tablet (400 mg total) by mouth 2 (two) times daily.   metFORMIN 500 MG tablet Commonly known as: GLUCOPHAGE Take 1 tablet (500 mg total) by mouth 2 (two) times daily with a meal.   MIRALAX PO Take by mouth as needed.   naproxen sodium 220 MG tablet Commonly known as: ALEVE Take 220 mg by mouth 2 (two) times daily as needed.   OneTouch Ultra test strip Generic drug: glucose blood USE TO CHECK SUGAR LEVELS TWICE DAILY.   pantoprazole 40 MG tablet Commonly known as: PROTONIX Take 1 tablet (40 mg total) by mouth daily.   propranolol ER 120 MG 24 hr capsule Commonly known as: INDERAL LA Take 1 capsule (120 mg total) by mouth daily.   tamsulosin 0.4 MG Caps capsule Commonly known as: FLOMAX Take 1 capsule (0.4 mg total) by mouth daily.   Tylenol 8 Hour Arthritis Pain 650 MG CR tablet Generic drug: acetaminophen Take 650 mg by mouth every 8 (eight) hours as needed for pain.       Allergies:  Allergies  Allergen Reactions  . Amlodipine     Lower extremity swelling at 10 mg.  . Fluoxetine Other (See Comments)    headache  . Meloxicam Other (See Comments)    Other reaction(s): Dizziness Nervousness.     Family History: Family History  Problem Relation Age of Onset  . Stroke Mother   . Hypertension Mother   . Hypertension Father   . Stroke Sister   . Prostate cancer Neg Hx   . Kidney cancer Neg Hx   . Bladder Cancer Neg Hx     Social History:  reports that he quit smoking about 25 years ago. He has never used smokeless tobacco. He reports current alcohol use of about 1.0 standard  drink of alcohol per week. He reports that he does not use drugs.   Physical Exam: There were no vitals taken for this visit.  Constitutional:  Alert, No acute distress. HEENT: [place] AT, moist mucus membranes.  Trachea midline, no masses. Cardiovascular: No clubbing, cyanosis, or edema. Respiratory: Normal respiratory effort, no increased work of breathing. Neurologic: Grossly intact, no focal deficits, moving all 4 extremities. Psychiatric: Normal mood and affect.   Assessment & Plan:    1.  Prostate cancer  PSA slightly decreased off continous ADT  Follow-up lab visit with PSA 6 months  1 year office visit  Continue intermittent androgen ablation  2.  Lower urinary tract symptoms  It appears tamsulosin is an active medication on review of med list and refill was sent  If he has discontinued inform family member not to pick up that   [place] [address], [place], [place] [address] [phone]

## 2020-05-21 ENCOUNTER — Telehealth: Payer: Self-pay

## 2020-05-24 ENCOUNTER — Ambulatory Visit: Payer: PPO | Admitting: Family Medicine

## 2020-05-31 ENCOUNTER — Encounter: Payer: Self-pay | Admitting: Family Medicine

## 2020-05-31 ENCOUNTER — Ambulatory Visit (INDEPENDENT_AMBULATORY_CARE_PROVIDER_SITE_OTHER): Payer: PPO | Admitting: Family Medicine

## 2020-05-31 ENCOUNTER — Other Ambulatory Visit: Payer: Self-pay

## 2020-05-31 VITALS — BP 160/79 | HR 69 | Temp 97.4°F | Wt 183.0 lb

## 2020-05-31 DIAGNOSIS — R8290 Unspecified abnormal findings in urine: Secondary | ICD-10-CM | POA: Diagnosis not present

## 2020-05-31 DIAGNOSIS — F322 Major depressive disorder, single episode, severe without psychotic features: Secondary | ICD-10-CM | POA: Diagnosis not present

## 2020-05-31 DIAGNOSIS — E1142 Type 2 diabetes mellitus with diabetic polyneuropathy: Secondary | ICD-10-CM | POA: Diagnosis not present

## 2020-05-31 LAB — URINALYSIS, ROUTINE W REFLEX MICROSCOPIC
Bilirubin, UA: NEGATIVE
Ketones, UA: NEGATIVE
Leukocytes,UA: NEGATIVE
Nitrite, UA: NEGATIVE
Protein,UA: NEGATIVE
RBC, UA: NEGATIVE
Specific Gravity, UA: 1.01 (ref 1.005–1.030)
Urobilinogen, Ur: 0.2 mg/dL (ref 0.2–1.0)
pH, UA: 6 (ref 5.0–7.5)

## 2020-05-31 LAB — BAYER DCA HB A1C WAIVED: HB A1C (BAYER DCA - WAIVED): 7 % — ABNORMAL HIGH (ref <7.0)

## 2020-05-31 MED ORDER — PANTOPRAZOLE SODIUM 40 MG PO TBEC: 40.0000 mg | DELAYED_RELEASE_TABLET | Freq: Every day | ORAL | 1 refills | Status: DC

## 2020-05-31 MED ORDER — TAMSULOSIN HCL 0.4 MG PO CAPS: 0.4000 mg | ORAL_CAPSULE | Freq: Every day | ORAL | 3 refills | Status: AC

## 2020-05-31 MED ORDER — GABAPENTIN 100 MG PO CAPS: 100.0000 mg | ORAL_CAPSULE | Freq: Every day | ORAL | 1 refills | Status: DC

## 2020-05-31 MED ORDER — HYDRALAZINE HCL 50 MG PO TABS
50.0000 mg | ORAL_TABLET | Freq: Three times a day (TID) | ORAL | 1 refills | Status: DC
Start: 2020-05-31 — End: 2020-11-29

## 2020-05-31 MED ORDER — DULOXETINE HCL 60 MG PO CPEP: 60.0000 mg | ORAL_CAPSULE | Freq: Every day | ORAL | 1 refills | Status: DC

## 2020-05-31 MED ORDER — METFORMIN HCL 500 MG PO TABS: 500.0000 mg | ORAL_TABLET | Freq: Two times a day (BID) | ORAL | 1 refills | Status: DC

## 2020-05-31 MED ORDER — ATORVASTATIN CALCIUM 20 MG PO TABS
20.0000 mg | ORAL_TABLET | Freq: Every day | ORAL | 1 refills | Status: DC
Start: 2020-05-31 — End: 2020-11-29

## 2020-05-31 NOTE — Assessment & Plan Note (Signed)
Under good control on current regimen. Continue current regimen. Continue to monitor. Call with any concerns. Refills given.   

## 2020-05-31 NOTE — Progress Notes (Signed)
BP (!) 160/79   Pulse 69   Temp (!) 97.4 F (36.3 C) (Oral)   Wt 183 lb (83 kg)   BMI 27.83 kg/m    Subjective:    Patient ID: Todd [name], male    DOB: 07-18-1930, 86 y.o.   MRN: [id number]  HPI: Todd [name] is a 86 y.o. male  Chief Complaint  Patient presents with  . Diabetes  . Hypertension  . Urine Odor    Pt's daughter has been noticing that the patient has a strong urine odor in the mornings    URINARY SYMPTOMS Duration: past couple of weeks Dysuria: no Urinary frequency: yes Urgency: yes Small volume voids: no Symptom severity: moderate Urinary incontinence: yes Foul odor: yes Hematuria: no Abdominal pain: no Back pain: no Suprapubic pain/pressure: no Flank pain: no Fever:  no Vomiting: no Relief with cranberry juice: no Relief with pyridium: no Status: stable Previous urinary tract infection: yes Recurrent urinary tract infection: no Sexual activity: Not sexually active History of sexually transmitted disease: no Penile discharge: no Treatments attempted: increasing fluids   DIABETES Hypoglycemic episodes:no Polydipsia/polyuria: no Visual disturbance: no Chest pain: no Paresthesias: no Glucose Monitoring: yes  Accucheck frequency: Not Checking Taking Insulin?: no Blood Pressure Monitoring: not checking Retinal Examination: Up to Date Foot Exam: unable to do due to mental status Diabetic Education: Completed Pneumovax: Up to Date Influenza: Up to Date Aspirin: yes  Relevant past medical, surgical, family and social history reviewed and updated as indicated. Interim medical history since our last visit reviewed. Allergies and medications reviewed and updated.  Review of Systems  Constitutional: Negative.   Respiratory: Negative.   Cardiovascular: Negative.   Gastrointestinal: Negative.   Genitourinary: Negative.   Musculoskeletal: Negative.   Neurological: Positive for speech difficulty and weakness. Negative for dizziness, tremors,  seizures, syncope, facial asymmetry, light-headedness, numbness and headaches.  Psychiatric/Behavioral: Negative.     Per HPI unless specifically indicated above     Objective:    BP (!) 160/79   Pulse 69   Temp (!) 97.4 F (36.3 C) (Oral)   Wt 183 lb (83 kg)   BMI 27.83 kg/m   Wt Readings from Last 3 Encounters:  05/31/20 183 lb (83 kg)  05/19/20 178 lb (80.7 kg)  03/15/20 182 lb (82.6 kg)    Physical Exam Vitals and nursing note reviewed.  Constitutional:      General: He is not in acute distress.    Appearance: Normal appearance. He is not ill-appearing, toxic-appearing or diaphoretic.  HENT:     Head: Normocephalic and atraumatic.     Right Ear: External ear normal.     Left Ear: External ear normal.     Nose: Nose normal.     Mouth/Throat:     Mouth: Mucous membranes are moist.     Pharynx: Oropharynx is clear.  Eyes:     General: No scleral icterus.       Right eye: No discharge.        Left eye: No discharge.     Extraocular Movements: Extraocular movements intact.     Conjunctiva/sclera: Conjunctivae normal.     Pupils: Pupils are equal, round, and reactive to light.  Cardiovascular:     Rate and Rhythm: Normal rate and regular rhythm.     Pulses: Normal pulses.     Heart sounds: Normal heart sounds. No murmur heard. No friction rub. No gallop.   Pulmonary:     Effort: Pulmonary effort is normal. No  respiratory distress.     Breath sounds: Normal breath sounds. No stridor. No wheezing, rhonchi or rales.  Chest:     Chest wall: No tenderness.  Musculoskeletal:        General: Normal range of motion.     Cervical back: Normal range of motion and neck supple.  Skin:    General: Skin is warm and dry.     Capillary Refill: Capillary refill takes less than 2 seconds.     Coloration: Skin is not jaundiced or pale.     Findings: No bruising, erythema, lesion or rash.  Neurological:     Mental Status: He is alert. Mental status is at baseline.     Motor:  Weakness present.     Comments: aphasia  Psychiatric:        Mood and Affect: Mood normal.        Behavior: Behavior normal.        Thought Content: Thought content normal.        Judgment: Judgment normal.     Results for orders placed or performed in visit on 05/17/20  PSA  Result Value Ref Range   Prostate Specific Ag, Serum 3.4 0.0 - 4.0 ng/mL      Assessment & Plan:   Problem List Items Addressed This Visit      Endocrine   Type 2 diabetes mellitus with peripheral neuropathy ([place]) - Primary    Doing well with A1c of 7.0. Continue current regimen. Continue to monitor. Call with any concerns.       Relevant Medications   metFORMIN (GLUCOPHAGE) 500 MG tablet   atorvastatin (LIPITOR) 20 MG tablet   DULoxetine (CYMBALTA) 60 MG capsule   gabapentin (NEURONTIN) 100 MG capsule   Other Relevant Orders   Bayer DCA Hb A1c Waived     Other   Depression, major, single episode, severe ([place])    Under good control on current regimen. Continue current regimen. Continue to monitor. Call with any concerns. Refills given.        Relevant Medications   DULoxetine (CYMBALTA) 60 MG capsule    Other Visit Diagnoses    Abnormal urine odor       UA clear except 3+ glucose. Continue to monitor. Continue meds. Call with any concerns.    Relevant Orders   Urinalysis, Routine w reflex microscopic       Follow up plan: Return in about 3 months (around 08/29/2020).

## 2020-05-31 NOTE — Assessment & Plan Note (Signed)
Doing well with A1c of 7.0. Continue current regimen. Continue to monitor. Call with any concerns.  

## 2020-06-10 ENCOUNTER — Ambulatory Visit (INDEPENDENT_AMBULATORY_CARE_PROVIDER_SITE_OTHER): Payer: HMO

## 2020-06-10 ENCOUNTER — Other Ambulatory Visit: Payer: Self-pay

## 2020-06-10 ENCOUNTER — Ambulatory Visit
Admission: EM | Admit: 2020-06-10 | Discharge: 2020-06-10 | Disposition: A | Discharge status: Home / Self Care | Payer: HMO | Attending: Family Medicine | Admitting: Family Medicine

## 2020-06-10 DIAGNOSIS — R0602 Shortness of breath: Secondary | ICD-10-CM | POA: Insufficient documentation

## 2020-06-10 DIAGNOSIS — Z20822 Contact with and (suspected) exposure to covid-19: Secondary | ICD-10-CM | POA: Diagnosis not present

## 2020-06-10 DIAGNOSIS — I10 Essential (primary) hypertension: Secondary | ICD-10-CM | POA: Diagnosis not present

## 2020-06-10 LAB — CBC WITH DIFFERENTIAL/PLATELET
Abs Immature Granulocytes: 0.11 10*3/uL — ABNORMAL HIGH (ref 0.00–0.07)
Basophils Absolute: 0.1 10*3/uL (ref 0.0–0.1)
Basophils Relative: 1 %
Eosinophils Absolute: 0.2 10*3/uL (ref 0.0–0.5)
Eosinophils Relative: 2 %
HCT: 33 % — ABNORMAL LOW (ref 39.0–52.0)
Hemoglobin: 10.2 g/dL — ABNORMAL LOW (ref 13.0–17.0)
Immature Granulocytes: 2 %
Lymphocytes Relative: 19 %
Lymphs Abs: 1.3 10*3/uL (ref 0.7–4.0)
MCH: 30.3 pg (ref 26.0–34.0)
MCHC: 30.9 g/dL (ref 30.0–36.0)
MCV: 97.9 fL (ref 80.0–100.0)
Monocytes Absolute: 0.7 10*3/uL (ref 0.1–1.0)
Monocytes Relative: 10 %
Neutro Abs: 4.5 10*3/uL (ref 1.7–7.7)
Neutrophils Relative %: 66 %
Platelets: 263 10*3/uL (ref 150–400)
RBC: 3.37 MIL/uL — ABNORMAL LOW (ref 4.22–5.81)
RDW: 12.2 % (ref 11.5–15.5)
WBC: 6.8 10*3/uL (ref 4.0–10.5)
nRBC: 0 % (ref 0.0–0.2)

## 2020-06-10 LAB — COMPREHENSIVE METABOLIC PANEL
ALT: 10 U/L (ref 0–44)
AST: 16 U/L (ref 15–41)
Albumin: 4.1 g/dL (ref 3.5–5.0)
Alkaline Phosphatase: 85 U/L (ref 38–126)
Anion gap: 12 (ref 5–15)
BUN: 29 mg/dL — ABNORMAL HIGH (ref 8–23)
CO2: 28 mmol/L (ref 22–32)
Calcium: 9.1 mg/dL (ref 8.9–10.3)
Chloride: 97 mmol/L — ABNORMAL LOW (ref 98–111)
Creatinine, Ser: 1.31 mg/dL — ABNORMAL HIGH (ref 0.61–1.24)
GFR, Estimated: 53 mL/min — ABNORMAL LOW (ref >60)
Glucose, Bld: 310 mg/dL — ABNORMAL HIGH (ref 70–99)
Potassium: 4.9 mmol/L (ref 3.5–5.1)
Sodium: 137 mmol/L (ref 135–145)
Total Bilirubin: 0.5 mg/dL (ref 0.3–1.2)
Total Protein: 7.5 g/dL (ref 6.5–8.1)

## 2020-06-10 LAB — URINALYSIS, COMPLETE (UACMP) WITH MICROSCOPIC
Bacteria, UA: NONE SEEN
Bilirubin Urine: NEGATIVE
Glucose, UA: 500 mg/dL — AB
Hgb urine dipstick: NEGATIVE
Ketones, ur: NEGATIVE mg/dL
Leukocytes,Ua: NEGATIVE
Nitrite: NEGATIVE
Protein, ur: NEGATIVE mg/dL
Specific Gravity, Urine: 1.01 (ref 1.005–1.030)
WBC, UA: NONE SEEN WBC/hpf (ref 0–5)
pH: 5.5 (ref 5.0–8.0)

## 2020-06-10 NOTE — Discharge Instructions (Signed)
Check my chart for COVID test results.  If he worsens, take him to the ER  Take care  Dr. Adriana Simas

## 2020-06-10 NOTE — ED Triage Notes (Signed)
Patient presents to [facility] with daughter. States that he has been complaining about shortness of breath. Reports that he said he wanted to go to the ER today and that is not his normal. She would like him tested for covid, they did have some exposure over christmas. Patient daughter states that he has been on 4L of continuous oxygen and patient often says he wants to go higher but his doctor says he cannot.

## 2020-06-11 DIAGNOSIS — I2510 Atherosclerotic heart disease of native coronary artery without angina pectoris: Secondary | ICD-10-CM | POA: Diagnosis not present

## 2020-06-11 DIAGNOSIS — R0902 Hypoxemia: Secondary | ICD-10-CM | POA: Diagnosis not present

## 2020-06-11 DIAGNOSIS — I501 Left ventricular failure: Secondary | ICD-10-CM | POA: Diagnosis not present

## 2020-06-11 LAB — SARS CORONAVIRUS 2 (TAT 6-24 HRS): SARS Coronavirus 2: NEGATIVE

## 2020-06-11 NOTE — ED Provider Notes (Signed)
[facility]    CSN: [id number] Arrival date & time: 06/10/20  1722      History   Chief Complaint Chief Complaint  Patient presents with  . Generalized Body Aches   HPI   87 year old male presents for evaluation at the request of his daughter.   Patient has suffered a stroke and has aphasia. Patient only says the word yes. He is able to shake his head to indicated yes and no.  Daughter reports that he informed her that he was not feeling well. She is unclear of what exactly is bothering him given his difficulty communicating.  I asked him directly what is bothering him and he shook his head yes to question directed at shortness of breath.  I cannot discern any other symptoms at this time.  He is on oxygen.  He is well-appearing and afebrile.  Daughter does report that he has had some recent sick contacts with COVID-19.  Past Medical History:  Diagnosis Date  . Anxiety   . Aortic regurgitation    a. 03/2017 Echo: Mod AI; b. 05/2017 Echo: Triv AI.  [name] Kitchen Arthritis    osteoarthritis-left knee  . CAD (coronary artery disease)    a. Reported h/o cath ~ 2008 - ? small vessel dzs. No PCI performed; b. 03/2017 MV: EF 45-54%, no ischemia/infarct.  . Depression   . Diabetes mellitus without complication ([place])   . Diastolic dysfunction    a. 03/2017 Echo: EF 55-65%; b. 05/2017 Echo: EF 55-60%, no rwma, Gr1 DD. Triv AI.  Mildly dil LA.  . ED (erectile dysfunction)   . Elevated troponin    a. chronic mild elevation - 0.03->0.04.  [name] Kitchen GERD (gastroesophageal reflux disease)   . Glaucoma   . Hyperlipidemia   . Hypertension   . Iron deficiency anemia due to chronic blood loss   . Left pontine stroke w/ cerebrovascular disease([facility])    a. 05/2017 MRI/A: acute to subacute L pontine infarct. Chronic left thalamic lacunar infarct. Severe basilar artery stenosis w/ radiographic string sign of the distal 1/3. Mild to moderate L PCA P2 segment stenosis; b. 05/2017 Carotid U/S: <50% bilat ICA  stenosis.  . Lumbago    Lumbosacral Neuritis  . Prostate cancer [facility])     Patient Active Problem List   Diagnosis Date Noted  . Lower urinary tract symptoms (LUTS) 05/19/2020  . Atrial fibrillation with rapid ventricular response ([place]) 11/11/2018  . Frequent PVCs   . Anemia due to stage 3 chronic kidney disease ([place]) 09/16/2018  . Normocytic anemia 05/17/2018  . Hemiplegia ([place]) 04/25/2018  . B12 deficiency 04/25/2018  . Dysphagia as late effect of cerebrovascular accident (CVA) 04/25/2018  . Depression, major, single episode, severe ([place]) 04/25/2018  . CHF (congestive heart failure) ([place]) 01/09/2018  . Advanced care planning/counseling discussion 12/20/2017  . Hypoxia 11/02/2017  . Mixed hyperlipidemia 09/21/2017  . Aphasia 05/18/2017  . CAD (coronary artery disease) 05/18/2017  . History of stroke with residual deficit 05/18/2017  . 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 Knee joint replaced by other means 07/04/2015  . Urinary incontinence 04/27/2015  . Anxiety, generalized 04/07/2015  . History of BCG vaccination 04/07/2015  . GERD (gastroesophageal reflux disease) 03/04/2015  . Degeneration of intervertebral disc of lumbar region 02/23/2015  . Neuritis or radiculitis due to rupture of lumbar intervertebral disc 02/23/2015  . Lumbar stenosis with neurogenic claudication 02/23/2015  . Hypertension 12/02/2014  . Type 2 diabetes mellitus with peripheral neuropathy ([place]) 12/02/2014  .  Benign essential tremor 03/13/2014  . Imbalance 03/13/2014  . Prostate cancer ([place]) 10/07/2012  . ED (erectile dysfunction) of organic origin 02/09/2012    Past Surgical History:  Procedure Laterality Date  . CARPAL TUNNEL RELEASE    . REPLACEMENT TOTAL KNEE BILATERAL         Home Medications    Prior to Admission medications   Medication Sig Start Date End Date Taking? Authorizing Provider  acetaminophen (TYLENOL) 650 MG CR tablet Take 650 mg by mouth every 8 (eight)  hours as needed for pain.   Yes [provider]  apixaban (ELIQUIS) 5 MG TABS tablet Take 1 tablet (5 mg total) by mouth 2 (two) times daily. 11/10/19  Yes Johnson, Megan P, DO  atorvastatin (LIPITOR) 20 MG tablet Take 1 tablet (20 mg total) by mouth daily. 05/31/20  Yes Johnson, Megan P, DO  blood glucose meter kit and supplies KIT Dispense based on patient and insurance preference. Use up to four times daily as directed. (FOR ICD-9 250.00, 250.01). 11/17/18  Yes Dustin Flock, MD  Blood Glucose Monitoring Suppl (ACCU-CHEK AVIVA PLUS) w/Device KIT Use to check sugar levels x 2 daily 11/17/18  Yes Dustin Flock, MD  carbidopa-levodopa (SINEMET IR) 25-100 MG tablet Take 0.5 tablets by mouth 3 (three) times daily.  07/08/19  Yes [provider]  digoxin (LANOXIN) 0.125 MG tablet Take 1 tablet (0.125 mg total) by mouth every other day. 03/22/20  Yes Loel Dubonnet, NP  docusate sodium (COLACE) 100 MG capsule Take 1 capsule (100 mg total) by mouth 2 (two) times daily as needed for mild constipation. 08/28/19  Yes Johnson, Megan P, DO  DULoxetine (CYMBALTA) 60 MG capsule Take 1 capsule (60 mg total) by mouth daily. 05/31/20  Yes Johnson, Megan P, DO  empagliflozin (JARDIANCE) 25 MG TABS tablet Take 1 tablet (25 mg total) by mouth daily before breakfast. 02/16/20  Yes Johnson, Megan P, DO  furosemide (LASIX) 20 MG tablet Take 1.5 tablets (30 mg) by mouth once daily Patient taking differently: 20 mg. Take 1.5 tablets (30 mg) by mouth once daily 10/06/19  Yes Visser, Jacquelyn D, PA-C  gabapentin (NEURONTIN) 100 MG capsule Take 1 capsule (100 mg total) by mouth at bedtime. 05/31/20  Yes Johnson, Megan P, DO  hydrALAZINE (APRESOLINE) 50 MG tablet Take 1 tablet (50 mg total) by mouth 3 (three) times daily. 05/31/20  Yes Johnson, Megan P, DO  Lancets (ACCU-CHEK SOFT TOUCH) lancets Use as instructed 11/17/18  Yes Dustin Flock, MD  latanoprost (XALATAN) 0.005 % ophthalmic solution Place 1 drop  into both eyes at bedtime.  06/08/16  Yes [provider]  Magnesium Oxide 400 (240 Mg) MG TABS Take 1 tablet (400 mg total) by mouth 2 (two) times daily. 07/01/19  Yes Dunn, Areta Haber, PA-C  metFORMIN (GLUCOPHAGE) 500 MG tablet Take 1 tablet (500 mg total) by mouth 2 (two) times daily with a meal. 05/31/20  Yes Johnson, Megan P, DO  naproxen sodium (ALEVE) 220 MG tablet Take 220 mg by mouth 2 (two) times daily as needed.    Yes [provider]  ONETOUCH ULTRA test strip USE TO CHECK SUGAR LEVELS TWICE DAILY. 11/18/18  Yes Crissman, Jeannette How, MD  pantoprazole (PROTONIX) 40 MG tablet Take 1 tablet (40 mg total) by mouth daily. 05/31/20  Yes Johnson, Megan P, DO  Polyethylene Glycol 3350 (MIRALAX PO) Take by mouth as needed.   Yes [provider]  propranolol ER (INDERAL LA) 120 MG 24 hr  capsule Take 1 capsule (120 mg total) by mouth daily. 03/22/20  Yes Gollan, Kathlene November, MD  tamsulosin (FLOMAX) 0.4 MG CAPS capsule Take 1 capsule (0.4 mg total) by mouth daily. 05/31/20  Yes Valerie Roys, DO    Family History Family History  Problem Relation Age of Onset  . Stroke Mother   . Hypertension Mother   . Hypertension Father   . Stroke Sister   . Prostate cancer Neg Hx   . Kidney cancer Neg Hx   . Bladder Cancer Neg Hx     Social History Social History   Tobacco Use  . Smoking status: Former Smoker    Quit date: 12/02/1994    Years since quitting: 25.5  . Smokeless tobacco: Never Used  Vaping Use  . Vaping Use: Never used  Substance Use Topics  . Alcohol use: Yes    Alcohol/week: 1.0 standard drink    Types: 1 Cans of beer per week    Comment: occasional  . Drug use: No     Allergies   Amlodipine, Fluoxetine, and Meloxicam   Review of Systems Review of Systems  Unable to perform ROS: Other  Aphasia   Physical Exam Triage Vital Signs ED Triage Vitals  Enc Vitals Group     BP 06/10/20 1824 130/79     Pulse Rate 06/10/20 1824 76     Resp 06/10/20  1824 20     Temp 06/10/20 1824 97.9 F (36.6 C)     Temp Source 06/10/20 1824 Oral     SpO2 06/10/20 1824 100 %     Weight 06/10/20 1823 182 lb 15.7 oz (83 kg)     Height --      Head Circumference --      Peak Flow --      Pain Score 06/10/20 1823 10     Pain Loc --      Pain Edu? --      Excl. in [place]? --    Updated Vital Signs BP 130/79 (BP Location: Right Arm)   Pulse 76   Temp 97.9 F (36.6 C) (Oral)   Resp 20   Wt 83 kg   SpO2 100%   BMI 27.82 kg/m   Visual Acuity Right Eye Distance:   Left Eye Distance:   Bilateral Distance:    Right Eye Near:   Left Eye Near:    Bilateral Near:     Physical Exam Constitutional:      General: He is not in acute distress. HENT:     Head: Normocephalic and atraumatic.  Eyes:     General:        Right eye: No discharge.        Left eye: No discharge.     Conjunctiva/sclera: Conjunctivae normal.  Cardiovascular:     Rate and Rhythm: Normal rate and regular rhythm.     Heart sounds: Murmur heard.    Pulmonary:     Effort: Pulmonary effort is normal. No respiratory distress.     Breath sounds: No wheezing or rales.  Neurological:     Mental Status: He is alert.     Comments: Aphasia.      UC Treatments / Results  Labs (all labs ordered are listed, but only abnormal results are displayed) Labs Reviewed  CBC WITH DIFFERENTIAL/PLATELET - Abnormal; Notable for the following components:      Result Value   RBC 3.37 (*)    Hemoglobin 10.2 (*)    HCT  33.0 (*)    Abs Immature Granulocytes 0.11 (*)    All other components within normal limits  COMPREHENSIVE METABOLIC PANEL - Abnormal; Notable for the following components:   [place] 97 (*)    Glucose, Bld 310 (*)    BUN 29 (*)    Creatinine, Ser 1.31 (*)    GFR, Estimated 53 (*)    All other components within normal limits  URINALYSIS, COMPLETE (UACMP) WITH MICROSCOPIC - Abnormal; Notable for the following components:   Glucose, UA 500 (*)    All other components  within normal limits  SARS CORONAVIRUS 2 (TAT 6-24 HRS)    EKG   Radiology DG Chest 2 View  Result Date: 06/10/2020 CLINICAL DATA:  Short of breath for several weeks, diabetes, hypertension EXAM: CHEST - 2 VIEW COMPARISON:  11/11/2018 FINDINGS: Frontal and lateral views of the chest demonstrate a stable cardiac silhouette. Chronic elevation of the right hemidiaphragm. No acute airspace disease, effusion, or pneumothorax. No acute bony abnormalities. IMPRESSION: 1. Stable exam, no acute process. Electronically Signed   By: Randa Ngo M.D.   On: 06/10/2020 19:23    Procedures Procedures (including critical care time)  Medications Ordered in UC Medications - No data to display  Initial Impression / Assessment and Plan / UC Course  I have reviewed the triage vital signs and the nursing notes.  Pertinent labs & imaging results that were available during my care of the patient were reviewed by me and considered in my medical decision making (see chart for details).    87 year old male presents with possible shortness of breath.  History very limited in the setting of patient's aphasia.  He is in no acute distress and is afebrile.  Laboratory studies notable for elevated glucose.  Creatinine slightly higher than baseline the patient does not appear to be clinically dehydrated.  Anemia stable.  No evidence of UTI.  Chest x-ray was obtained and was clear.  Covid test has returned negative.  Supportive care.   Final Clinical Impressions(s) / UC Diagnoses   Final diagnoses:  SOB (shortness of breath)     Discharge Instructions     Check my chart for COVID test results.  If he worsens, take him to the ER  Take care  Dr. Lacinda Axon     ED Prescriptions    None     PDMP not reviewed this encounter.   Coral Spikes, [place] 06/11/20 2231

## 2020-06-12 ENCOUNTER — Other Ambulatory Visit: Payer: Self-pay | Admitting: Family Medicine

## 2020-07-12 ENCOUNTER — Other Ambulatory Visit: Payer: Self-pay

## 2020-07-12 DIAGNOSIS — I2510 Atherosclerotic heart disease of native coronary artery without angina pectoris: Secondary | ICD-10-CM | POA: Diagnosis not present

## 2020-07-12 DIAGNOSIS — I501 Left ventricular failure: Secondary | ICD-10-CM | POA: Diagnosis not present

## 2020-07-12 DIAGNOSIS — R0902 Hypoxemia: Secondary | ICD-10-CM | POA: Diagnosis not present

## 2020-07-12 MED ORDER — DIGOXIN 125 MCG PO TABS: 0.1250 mg | ORAL_TABLET | ORAL | 0 refills | Status: DC

## 2020-07-12 MED ORDER — FUROSEMIDE 20 MG PO TABS: ORAL_TABLET | ORAL | 3 refills | Status: DC

## 2020-08-09 DIAGNOSIS — I501 Left ventricular failure: Secondary | ICD-10-CM | POA: Diagnosis not present

## 2020-08-09 DIAGNOSIS — R0902 Hypoxemia: Secondary | ICD-10-CM | POA: Diagnosis not present

## 2020-08-09 DIAGNOSIS — I2510 Atherosclerotic heart disease of native coronary artery without angina pectoris: Secondary | ICD-10-CM | POA: Diagnosis not present

## 2020-08-30 ENCOUNTER — Other Ambulatory Visit: Payer: Self-pay

## 2020-08-30 ENCOUNTER — Other Ambulatory Visit: Payer: Self-pay | Admitting: Family Medicine

## 2020-08-30 ENCOUNTER — Ambulatory Visit (INDEPENDENT_AMBULATORY_CARE_PROVIDER_SITE_OTHER): Payer: HMO | Admitting: Family Medicine

## 2020-08-30 ENCOUNTER — Encounter: Payer: Self-pay | Admitting: Family Medicine

## 2020-08-30 VITALS — BP 132/77 | HR 62 | Temp 97.8°F | Wt 173.0 lb

## 2020-08-30 DIAGNOSIS — F322 Major depressive disorder, single episode, severe without psychotic features: Secondary | ICD-10-CM

## 2020-08-30 DIAGNOSIS — I69359 Hemiplegia and hemiparesis following cerebral infarction affecting unspecified side: Secondary | ICD-10-CM

## 2020-08-30 DIAGNOSIS — E782 Mixed hyperlipidemia: Secondary | ICD-10-CM | POA: Diagnosis not present

## 2020-08-30 DIAGNOSIS — I10 Essential (primary) hypertension: Secondary | ICD-10-CM

## 2020-08-30 DIAGNOSIS — E538 Deficiency of other specified B group vitamins: Secondary | ICD-10-CM

## 2020-08-30 DIAGNOSIS — G250 Essential tremor: Secondary | ICD-10-CM | POA: Diagnosis not present

## 2020-08-30 DIAGNOSIS — K219 Gastro-esophageal reflux disease without esophagitis: Secondary | ICD-10-CM

## 2020-08-30 DIAGNOSIS — E1142 Type 2 diabetes mellitus with diabetic polyneuropathy: Secondary | ICD-10-CM

## 2020-08-30 MED ORDER — EMPAGLIFLOZIN 25 MG PO TABS: 25.0000 mg | ORAL_TABLET | Freq: Every day | ORAL | 1 refills | Status: DC

## 2020-08-30 MED ORDER — DOCUSATE SODIUM 100 MG PO CAPS: 100.0000 mg | ORAL_CAPSULE | Freq: Two times a day (BID) | ORAL | 1 refills | Status: DC | PRN

## 2020-08-30 NOTE — Telephone Encounter (Signed)
Notes to clinic: Patient due for appointment today. Review to go ahead and refill  Requested Prescriptions  Pending Prescriptions Disp Refills   JARDIANCE 25 MG TABS tablet [Pharmacy Med Name: JARDIANCE 25 MG TABLET] 90 tablet 0    Sig: Take 1 tablet (25 mg total) by mouth daily before breakfast.      Endocrinology:  Diabetes - SGLT2 Inhibitors Failed - 08/30/2020  9:10 AM      Failed - Cr in normal range and within 360 days    Creatinine, Ser  Date Value Ref Range Status  06/10/2020 1.31 (H) 0.61 - 1.24 mg/dL Final          Failed - LDL in normal range and within 360 days    LDL Chol Calc (NIH)  Date Value Ref Range Status  02/16/2020 59 0 - 99 mg/dL Final          Passed - HBA1C is between 0 and 7.9 and within 180 days    HB A1C (BAYER DCA - WAIVED)  Date Value Ref Range Status  05/31/2020 7.0 (H) <7.0 % Final    Comment:                                          Diabetic Adult            <7.0                                       Healthy Adult        4.3 - 5.7                                                           (DCCT/NGSP) American Diabetes Association's Summary of Glycemic Recommendations for Adults with Diabetes: Hemoglobin A1c <7.0%. More stringent glycemic goals (A1c <6.0%) may further reduce complications at the cost of increased risk of hypoglycemia.           Passed - AA eGFR in normal range and within 360 days    GFR calc Af Amer  Date Value Ref Range Status  02/16/2020 61 >59 mL/min/1.73 Final    Comment:    **[facility] currently reports eGFR in compliance with the current**   recommendations of the [employer]. [facility] will   update reporting as new guidelines are published from the NKF-ASN   Task force.    GFR, Estimated  Date Value Ref Range Status  06/10/2020 53 (L) >60 mL/min Final    Comment:    (NOTE) Calculated using the CKD-EPI Creatinine Equation (2021)           Passed - Valid encounter within last 6 months     Recent Outpatient Visits           3 months ago Type 2 diabetes mellitus with peripheral neuropathy ([place])   [place], Megan P, DO   6 months ago Essential hypertension   [place], Megan P, DO   9 months ago Type 2 diabetes mellitus with peripheral neuropathy ([place])   [place], Megan P, DO  1 year ago Type 2 diabetes mellitus with peripheral neuropathy [facility])   [place], Megan P, DO   1 year ago Type 2 diabetes mellitus with peripheral neuropathy [facility])   [place], Barb Merino, DO       Future Appointments             Today Johnson, Barb Merino, DO [place], [place]   In 1 month  [employer], [place]   In 8 months Stoioff, Ronda Fairly, MD [place]

## 2020-08-30 NOTE — Progress Notes (Signed)
BP 132/77   Pulse 62   Temp 97.8 F (36.6 C)   Wt 173 lb (78.5 kg)   BMI 26.30 kg/m    Subjective:    Patient ID: Todd [name], male    DOB: [birth year], 87 y.o.   MRN: [id number]  HPI: Todd [name] is a 87 y.o. male  Chief Complaint  Patient presents with  . Diabetes  . Depression  . Tremors    Patient daughter states tremors are getting worse    HYPERTENSION / HYPERLIPIDEMIA Satisfied with current treatment? yes Duration of hypertension: chronic BP monitoring frequency: not checking BP medication side effects: no Past BP meds: propanolol, hydralazine, lasix, digoxin Duration of hyperlipidemia: chronic Cholesterol medication side effects: no Cholesterol supplements: none Past cholesterol medications: atorvastatin Medication compliance: excellent compliance Aspirin: no Recent stressors: yes Recurrent headaches: no Visual changes: no Palpitations: no Dyspnea: no Chest pain: no Lower extremity edema: no Dizzy/lightheaded: no  DIABETES Hypoglycemic episodes:no Polydipsia/polyuria: no Visual disturbance: no Chest pain: no Paresthesias: no Glucose Monitoring: yes  Accucheck frequency: Daily Taking Insulin?: no Blood Pressure Monitoring: not checking Retinal Examination: Up to Date Foot Exam: Up to Date Diabetic Education: Completed Pneumovax: Up to Date Influenza: Up to Date Aspirin: no  His daughter notes that his tremor has gotten worse. He has not seen his neurologist in a while.   DEPRESSION Mood status: stable Satisfied with current treatment?: yes Symptom severity: moderate  Duration of current treatment : chronic Side effects: no Medication compliance: excellent compliance Psychotherapy/counseling: no  Previous psychiatric medications: cymbalta Depressed mood: yes Anxious mood: no Anhedonia: no Significant weight loss or gain: no Insomnia: no  Fatigue: yes Feelings of worthlessness or guilt: yes Impaired concentration/indecisiveness:  no Suicidal ideations: no Hopelessness: yes Crying spells: yes Depression screen [facility] 2/9 05/31/2020 02/24/2020 11/10/2019 04/25/2018 10/26/2017  Decreased Interest 0 _0 0  Down, Depressed, Hopeless _1 0  PHQ - 2 Score _2 0  Altered sleeping _3 -  Tired, decreased energy _4 -  Change in appetite 0 0 0 1 -  Feeling bad or failure about yourself  0 _5 -  Trouble concentrating 1 0 0 0 -  Moving slowly or fidgety/restless 0 _6 -  Suicidal thoughts 0 0 3 2 -  PHQ-9 Score _7 -  Difficult doing work/chores Not difficult at all Very difficult Very difficult Very difficult -  Some recent data might be hidden     Relevant past medical, surgical, family and social history reviewed and updated as indicated. Interim medical history since our last visit reviewed. Allergies and medications reviewed and updated.  Review of Systems  Constitutional: Negative.   Respiratory: Negative.   Cardiovascular: Negative.   Gastrointestinal: Negative.   Musculoskeletal: Negative.   Neurological: Positive for tremors and weakness. Negative for dizziness, seizures, syncope, facial asymmetry, speech difficulty, light-headedness, numbness and headaches.  Psychiatric/Behavioral: Positive for dysphoric mood. Negative for agitation, behavioral problems, confusion, decreased concentration, hallucinations, self-injury, sleep disturbance and suicidal ideas. The patient is not nervous/anxious and is not hyperactive.     Per HPI unless specifically indicated above     Objective:    BP 132/77   Pulse 62   Temp 97.8 F (36.6 C)   Wt 173 lb (78.5 kg)   BMI 26.30 kg/m   Wt Readings from Last 3 Encounters:  08/30/20 173 lb (78.5 kg)  06/10/20 182 lb  15.7 oz (83 kg)  05/31/20 183 lb (83 kg)    Physical Exam Vitals and nursing note reviewed.  Constitutional:      General: He is not in acute distress.    Appearance: Normal appearance. He is not ill-appearing, toxic-appearing or  diaphoretic.  HENT:     Head: Normocephalic and atraumatic.     Right Ear: External ear normal.     Left Ear: External ear normal.     Nose: Nose normal.     Mouth/Throat:     Mouth: Mucous membranes are moist.     Pharynx: Oropharynx is clear.  Eyes:     General: No scleral icterus.       Right eye: No discharge.        Left eye: No discharge.     Extraocular Movements: Extraocular movements intact.     Conjunctiva/sclera: Conjunctivae normal.     Pupils: Pupils are equal, round, and reactive to light.  Cardiovascular:     Rate and Rhythm: Normal rate and regular rhythm.     Pulses: Normal pulses.     Heart sounds: Normal heart sounds. No murmur heard. No friction rub. No gallop.   Pulmonary:     Effort: Pulmonary effort is normal. No respiratory distress.     Breath sounds: Normal breath sounds. No stridor. No wheezing, rhonchi or rales.  Chest:     Chest wall: No tenderness.  Musculoskeletal:        General: Normal range of motion.     Cervical back: Normal range of motion and neck supple.  Skin:    General: Skin is warm and dry.     Capillary Refill: Capillary refill takes less than 2 seconds.     Coloration: Skin is not jaundiced or pale.     Findings: No bruising, erythema, lesion or rash.  Neurological:     Mental Status: He is alert. Mental status is at baseline.     Motor: Weakness present.     Comments: Aphasia, weakness, wheelchair bound  Psychiatric:        Mood and Affect: Mood normal.        Behavior: Behavior normal.        Thought Content: Thought content normal.        Judgment: Judgment normal.     Results for orders placed or performed in visit on 08/30/20  Bayer DCA Hb A1c Waived  Result Value Ref Range   HB A1C (BAYER DCA - WAIVED) 7.5 (H) <7.0 %  CBC with Differential/Platelet  Result Value Ref Range   WBC 6.5 3.4 - 10.8 x10E3/uL   RBC 3.51 (L) 4.14 - 5.80 x10E6/uL   Hemoglobin 10.9 (L) 13.0 - 17.7 g/dL   Hematocrit 32.6 (L) 37.5 - 51.0 %    MCV 93 79 - 97 fL   MCH 31.1 26.6 - 33.0 pg   MCHC 33.4 31.5 - 35.7 g/dL   RDW 11.4 (L) 11.6 - 15.4 %   Platelets 243 150 - 450 x10E3/uL   Neutrophils 64 Not Estab. %   Lymphs 20 Not Estab. %   Monocytes 10 Not Estab. %   Eos 2 Not Estab. %   Basos 1 Not Estab. %   Neutrophils Absolute 4.1 1.4 - 7.0 x10E3/uL   Lymphocytes Absolute 1.3 0.7 - 3.1 x10E3/uL   Monocytes Absolute 0.7 0.1 - 0.9 x10E3/uL   EOS (ABSOLUTE) 0.2 0.0 - 0.4 x10E3/uL   Basophils Absolute 0.1 0.0 - 0.2 x10E3/uL  Immature Granulocytes 3 Not Estab. %   Immature Grans (Abs) 0.2 (H) 0.0 - 0.1 x10E3/uL  Comprehensive metabolic panel  Result Value Ref Range   Glucose 165 (H) 65 - 99 mg/dL   BUN 18 8 - 27 mg/dL   Creatinine, Ser 1.37 (H) 0.76 - 1.27 mg/dL   eGFR 50 (L) >59 mL/min/1.73   BUN/Creatinine Ratio 13 10 - 24   Sodium 141 134 - 144 mmol/L   Potassium 5.2 3.5 - 5.2 mmol/L   Chloride 98 96 - 106 mmol/L   CO2 28 20 - 29 mmol/L   Calcium 9.8 8.6 - 10.2 mg/dL   Total Protein 6.7 6.0 - 8.5 g/dL   Albumin 4.5 3.6 - 4.6 g/dL   Globulin, Total 2.2 1.5 - 4.5 g/dL   Albumin/Globulin Ratio 2.0 1.2 - 2.2   Bilirubin Total 0.4 0.0 - 1.2 mg/dL   Alkaline Phosphatase 90 44 - 121 IU/L   AST 15 0 - 40 IU/L   ALT 10 0 - 44 IU/L  Lipid Panel w/o Chol/HDL Ratio  Result Value Ref Range   Cholesterol, Total 141 100 - 199 mg/dL   Triglycerides 174 (H) 0 - 149 mg/dL   HDL 45 >39 mg/dL   VLDL Cholesterol Cal 29 5 - 40 mg/dL   LDL Chol Calc (NIH) 67 0 - 99 mg/dL  TSH  Result Value Ref Range   TSH 0.843 0.450 - 4.500 uIU/mL  VITAMIN D 25 Hydroxy (Vit-D Deficiency, Fractures)  Result Value Ref Range   Vit D, 25-Hydroxy 26.5 (L) 30.0 - 100.0 ng/mL  B12  Result Value Ref Range   Vitamin B-12 744 232 - 1,245 pg/mL  Urinalysis, Routine w reflex microscopic  Result Value Ref Range   Specific Gravity, UA 1.010 1.005 - 1.030   pH, UA 5.5 5.0 - 7.5   Color, UA Yellow Yellow   Appearance Ur Clear Clear   Leukocytes,UA  Negative Negative   Protein,UA Negative Negative/Trace   Glucose, UA 3+ (A) Negative   Ketones, UA Negative Negative   RBC, UA Negative Negative   Bilirubin, UA Negative Negative   Urobilinogen, Ur 0.2 0.2 - 1.0 mg/dL   Nitrite, UA Negative Negative      Assessment & Plan:   Problem List Items Addressed This Visit      Cardiovascular and Mediastinum   Hypertension    Under good control on current regimen. Continue current regimen. Continue to monitor. Call with any concerns. Refills given. Labs drawn today.          Digestive   GERD (gastroesophageal reflux disease)    Under good control on current regimen. Continue current regimen. Continue to monitor. Call with any concerns. Refills given. Labs drawn today.       Relevant Medications   docusate sodium (COLACE) 100 MG capsule     Endocrine   Type 2 diabetes mellitus with peripheral neuropathy ([place]) - Primary    Up with A1c of 7.5 from 7.0- we will hold on changing medicine right now. Continue diet. Goal of less than 8. Continue to monitor.       Relevant Medications   empagliflozin (JARDIANCE) 25 MG TABS tablet   Other Relevant Orders   Bayer DCA Hb A1c Waived (Completed)   Comprehensive metabolic panel (Completed)   Urinalysis, Routine w reflex microscopic (Completed)   Microalbumin, Urine Waived     Nervous and Auditory   Benign essential tremor    Worsening. Will get him back into see  neurology. Call with any concerns. Labs drawn today.       Relevant Orders   Comprehensive metabolic panel (Completed)   TSH (Completed)   VITAMIN D 25 Hydroxy (Vit-D Deficiency, Fractures) (Completed)   Ambulatory referral to Neurology   Hemiplegia (Todd)    Stable. Continues to have help. Continue to monitor.         Other   Mixed hyperlipidemia    Under good control on current regimen. Continue current regimen. Continue to monitor. Call with any concerns. Refills given. Labs drawn today.       Relevant Orders    Comprehensive metabolic panel (Completed)   Lipid Panel w/o Chol/HDL Ratio (Completed)   B12 deficiency    Rechecking labs today. Await results. Treat as needed.       Relevant Orders   CBC with Differential/Platelet (Completed)   Comprehensive metabolic panel (Completed)   B12 (Completed)   Depression, major, single episode, severe (H[facility])    Under good control on current regimen. Continue current regimen. Continue to monitor. Call with any concerns. Refills given. Labs drawn today.       Relevant Orders   Comprehensive metabolic panel (Completed)    Other Visit Diagnoses    Essential hypertension       Relevant Orders   Comprehensive metabolic panel (Completed)       Follow up plan: Return in about 6 months (around 03/02/2021).

## 2020-08-31 LAB — COMPREHENSIVE METABOLIC PANEL
ALT: 10 IU/L (ref 0–44)
AST: 15 IU/L (ref 0–40)
Albumin/Globulin Ratio: 2 (ref 1.2–2.2)
Albumin: 4.5 g/dL (ref 3.6–4.6)
Alkaline Phosphatase: 90 IU/L (ref 44–121)
BUN/Creatinine Ratio: 13 (ref 10–24)
BUN: 18 mg/dL (ref 8–27)
Bilirubin Total: 0.4 mg/dL (ref 0.0–1.2)
CO2: 28 mmol/L (ref 20–29)
Calcium: 9.8 mg/dL (ref 8.6–10.2)
Chloride: 98 mmol/L (ref 96–106)
Creatinine, Ser: 1.37 mg/dL — ABNORMAL HIGH (ref 0.76–1.27)
Globulin, Total: 2.2 g/dL (ref 1.5–4.5)
Glucose: 165 mg/dL — ABNORMAL HIGH (ref 65–99)
Potassium: 5.2 mmol/L (ref 3.5–5.2)
Sodium: 141 mmol/L (ref 134–144)
Total Protein: 6.7 g/dL (ref 6.0–8.5)
eGFR: 50 mL/min/{1.73_m2} — ABNORMAL LOW (ref >59)

## 2020-08-31 LAB — CBC WITH DIFFERENTIAL/PLATELET
Basophils Absolute: 0.1 10*3/uL (ref 0.0–0.2)
Basos: 1 %
EOS (ABSOLUTE): 0.2 10*3/uL (ref 0.0–0.4)
Eos: 2 %
Hematocrit: 32.6 % — ABNORMAL LOW (ref 37.5–51.0)
Hemoglobin: 10.9 g/dL — ABNORMAL LOW (ref 13.0–17.7)
Immature Grans (Abs): 0.2 10*3/uL — ABNORMAL HIGH (ref 0.0–0.1)
Immature Granulocytes: 3 %
Lymphocytes Absolute: 1.3 10*3/uL (ref 0.7–3.1)
Lymphs: 20 %
MCH: 31.1 pg (ref 26.6–33.0)
MCHC: 33.4 g/dL (ref 31.5–35.7)
MCV: 93 fL (ref 79–97)
Monocytes Absolute: 0.7 10*3/uL (ref 0.1–0.9)
Monocytes: 10 %
Neutrophils Absolute: 4.1 10*3/uL (ref 1.4–7.0)
Neutrophils: 64 %
Platelets: 243 10*3/uL (ref 150–450)
RBC: 3.51 x10E6/uL — ABNORMAL LOW (ref 4.14–5.80)
RDW: 11.4 % — ABNORMAL LOW (ref 11.6–15.4)
WBC: 6.5 10*3/uL (ref 3.4–10.8)

## 2020-08-31 LAB — URINALYSIS, ROUTINE W REFLEX MICROSCOPIC
Bilirubin, UA: NEGATIVE
Ketones, UA: NEGATIVE
Leukocytes,UA: NEGATIVE
Nitrite, UA: NEGATIVE
Protein,UA: NEGATIVE
RBC, UA: NEGATIVE
Specific Gravity, UA: 1.01 (ref 1.005–1.030)
Urobilinogen, Ur: 0.2 mg/dL (ref 0.2–1.0)
pH, UA: 5.5 (ref 5.0–7.5)

## 2020-08-31 LAB — VITAMIN B12: Vitamin B-12: 744 pg/mL (ref 232–1245)

## 2020-08-31 LAB — LIPID PANEL W/O CHOL/HDL RATIO
Cholesterol, Total: 141 mg/dL (ref 100–199)
HDL: 45 mg/dL (ref >39)
LDL Chol Calc (NIH): 67 mg/dL (ref 0–99)
Triglycerides: 174 mg/dL — ABNORMAL HIGH (ref 0–149)
VLDL Cholesterol Cal: 29 mg/dL (ref 5–40)

## 2020-08-31 LAB — BAYER DCA HB A1C WAIVED: HB A1C (BAYER DCA - WAIVED): 7.5 % — ABNORMAL HIGH (ref <7.0)

## 2020-08-31 LAB — TSH: TSH: 0.843 u[IU]/mL (ref 0.450–4.500)

## 2020-08-31 LAB — VITAMIN D 25 HYDROXY (VIT D DEFICIENCY, FRACTURES): Vit D, 25-Hydroxy: 26.5 ng/mL — ABNORMAL LOW (ref 30.0–100.0)

## 2020-09-03 ENCOUNTER — Encounter: Payer: Self-pay | Admitting: Family Medicine

## 2020-09-03 NOTE — Assessment & Plan Note (Signed)
Under good control on current regimen. Continue current regimen. Continue to monitor. Call with any concerns. Refills given. Labs drawn today.   

## 2020-09-03 NOTE — Assessment & Plan Note (Signed)
Worsening. Will get him back into see neurology. Call with any concerns. Labs drawn today.

## 2020-09-03 NOTE — Assessment & Plan Note (Signed)
Rechecking labs today. Await results. Treat as needed.  °

## 2020-09-03 NOTE — Assessment & Plan Note (Signed)
Stable. Continues to have help. Continue to monitor.

## 2020-09-03 NOTE — Assessment & Plan Note (Signed)
Up with A1c of 7.5 from 7.0- we will hold on changing medicine right now. Continue diet. Goal of less than 8. Continue to monitor.

## 2020-09-05 ENCOUNTER — Encounter: Payer: Self-pay | Admitting: Family Medicine

## 2020-09-09 ENCOUNTER — Telehealth: Payer: Self-pay

## 2020-09-09 DIAGNOSIS — I2510 Atherosclerotic heart disease of native coronary artery without angina pectoris: Secondary | ICD-10-CM | POA: Diagnosis not present

## 2020-09-09 DIAGNOSIS — R0902 Hypoxemia: Secondary | ICD-10-CM | POA: Diagnosis not present

## 2020-09-09 DIAGNOSIS — I501 Left ventricular failure: Secondary | ICD-10-CM | POA: Diagnosis not present

## 2020-09-09 NOTE — Progress Notes (Signed)
Chronic Care Management Pharmacy Assistant   Name: Todd [name]  MRN: [id number] DOB: 02-20-1931  Reason for Encounter: Disease State- Diabetes Mellitus Disease State Call  Recent office visits:  08/30/20- Park Liter, DO- General visit, f/u 6 months  05/31/20- Park Liter, DO- General visit f/u 3 months   Recent consult visits:  06/10/20- Thersa Salt (Urgent Care)- Shortness of Breath, f/u with ED if needed 05/19/20- Robley Fries (Urology)- Prostate cancer  04/08/20- Sharlotte Alamo ( Podiatry)-Foot care [facility] visits:  None in previous 6 months  Medications: Outpatient Encounter Medications as of 09/09/2020  Medication Sig Note  . acetaminophen (TYLENOL) 650 MG CR tablet Take 650 mg by mouth every 8 (eight) hours as needed for pain.   [name] Kitchen apixaban (ELIQUIS) 5 MG TABS tablet Take 1 tablet (5 mg total) by mouth 2 (two) times daily.   [name] Kitchen atorvastatin (LIPITOR) 20 MG tablet Take 1 tablet (20 mg total) by mouth daily.   . blood glucose meter kit and supplies KIT Dispense based on patient and insurance preference. Use up to four times daily as directed. (FOR ICD-9 250.00, 250.01).   . Blood Glucose Monitoring Suppl (ACCU-CHEK AVIVA PLUS) w/Device KIT Use to check sugar levels x 2 daily   . carbidopa-levodopa (SINEMET IR) 25-100 MG tablet Take 0.5 tablets by mouth 3 (three) times daily.    . digoxin (LANOXIN) 0.125 MG tablet Take 1 tablet (0.125 mg total) by mouth every other day.   . docusate sodium (COLACE) 100 MG capsule Take 1 capsule (100 mg total) by mouth 2 (two) times daily as needed for mild constipation.   . DULoxetine (CYMBALTA) 60 MG capsule Take 1 capsule (60 mg total) by mouth daily.   . empagliflozin (JARDIANCE) 25 MG TABS tablet Take 1 tablet (25 mg total) by mouth daily before breakfast.   . furosemide (LASIX) 20 MG tablet Take 1.5 tablets (30 mg) by mouth once daily   . gabapentin (NEURONTIN) 100 MG capsule Take 1 capsule (100 mg total) by mouth at bedtime.    . hydrALAZINE (APRESOLINE) 50 MG tablet Take 1 tablet (50 mg total) by mouth 3 (three) times daily.   . Lancets (ACCU-CHEK SOFT TOUCH) lancets Use as instructed   . latanoprost (XALATAN) 0.005 % ophthalmic solution Place 1 drop into both eyes at bedtime.    . Magnesium Oxide 400 (240 Mg) MG TABS Take 1 tablet (400 mg total) by mouth 2 (two) times daily.   . metFORMIN (GLUCOPHAGE) 500 MG tablet Take 1 tablet (500 mg total) by mouth 2 (two) times daily with a meal.   . naproxen sodium (ALEVE) 220 MG tablet Take 220 mg by mouth 2 (two) times daily as needed.  08/07/2017: Patient reports taking at 2 to 3 tablets a day.   [name] ULTRA test strip USE TO CHECK SUGAR LEVELS TWICE DAILY.   . pantoprazole (PROTONIX) 40 MG tablet Take 1 tablet (40 mg total) by mouth daily.   . Polyethylene Glycol 3350 (MIRALAX PO) Take by mouth as needed.   . propranolol ER (INDERAL LA) 120 MG 24 hr capsule Take 1 capsule (120 mg total) by mouth daily.   . tamsulosin (FLOMAX) 0.4 MG CAPS capsule Take 1 capsule (0.4 mg total) by mouth daily.    No facility-administered encounter medications on file as of 09/09/2020.   Recent Relevant Labs: Lab Results  Component Value Date/Time   HGBA1C 7.5 (H) 08/30/2020 02:22 PM   HGBA1C 7.0 (H) 05/31/2020 02:57 PM  MICROALBUR 10 02/16/2020 04:20 PM   MICROALBUR 80 (H) 12/13/2018 02:18 PM    Kidney Function Lab Results  Component Value Date/Time   CREATININE 1.37 (H) 08/30/2020 02:36 PM   CREATININE 1.31 (H) 06/10/2020 06:56 PM   GFRNONAA 53 (L) 06/10/2020 06:56 PM   GFRAA 61 02/16/2020 03:50 PM   Current antihyperglycemic regimen:  Jardiance 4m- one tab before breakfast  Metformin 5028m one tab twice daily   What recent interventions/DTPs have been made to improve glycemic control:  No recent interventions  Have there been any recent hospitalizations or ED visits since last visit with CPP? No   Adherence Review: Is the patient currently on a STATIN medication?  Yes  Is the patient currently on ACE/ARB medication? No   Does the patient have >5 day gap between last estimated fill dates? No Jardiance 25 mg 90 DS last filled 08/30/20 Metformin 500 mg - 90 DS last filled 07/30/20  Star Rating Drugs: Atorvastatin 2048m90DS last filled 06/29/20 Empagliflozin 17m60m0DS last filled 08/30/20 Metformin 500mg62mDS last fille 07/30/20  Several unsuccessful attempts made, CPP notified.  AshleWilford Sports CMA

## 2020-09-22 DIAGNOSIS — G20 Parkinson's disease: Secondary | ICD-10-CM | POA: Diagnosis not present

## 2020-09-22 DIAGNOSIS — M62542 Muscle wasting and atrophy, not elsewhere classified, left hand: Secondary | ICD-10-CM | POA: Diagnosis not present

## 2020-09-27 ENCOUNTER — Telehealth: Payer: Self-pay

## 2020-09-27 NOTE — Progress Notes (Signed)
Chronic Care Management Pharmacy Assistant   Name: Todd [name]  MRN: [id number] DOB: 08/31/1930  Reason for Encounter: Diabetes Mellitus Disease State Call   Recent office visits:  08/30/20- Todd [name]- General visit, f/u 6 months  05/31/20- Todd [name]- General visit f/u 3 months   Recent consult visits:  06/10/20- Todd [name] (Urgent Care)- Shortness of Breath, f/u with ED if needed 05/19/20- Todd [name] (Urology)- Prostate cancer  04/08/20- Todd [name] ( Podiatry)-Foot care Todd [name] visits:  None in previous 6 months  Medications: Outpatient Encounter Medications as of 09/27/2020  Medication Sig Note  . acetaminophen (TYLENOL) 650 MG CR tablet Take 650 mg by mouth every 8 (eight) hours as needed for pain.   [name] Kitchen apixaban (ELIQUIS) 5 MG TABS tablet Take 1 tablet (5 mg total) by mouth 2 (two) times daily.   [name] Kitchen atorvastatin (LIPITOR) 20 MG tablet Take 1 tablet (20 mg total) by mouth daily.   . blood glucose meter kit and supplies KIT Dispense based on patient and insurance preference. Use up to four times daily as directed. (FOR ICD-9 250.00, 250.01).   . Blood Glucose Monitoring Suppl (ACCU-CHEK AVIVA PLUS) w/Device KIT Use to check sugar levels x 2 daily   . carbidopa-levodopa (SINEMET IR) 25-100 MG tablet Take 0.5 tablets by mouth 3 (three) times daily.    . digoxin (LANOXIN) 0.125 MG tablet Take 1 tablet (0.125 mg total) by mouth every other day.   . docusate sodium (COLACE) 100 MG capsule Take 1 capsule (100 mg total) by mouth 2 (two) times daily as needed for mild constipation.   . DULoxetine (CYMBALTA) 60 MG capsule Take 1 capsule (60 mg total) by mouth daily.   . empagliflozin (JARDIANCE) 25 MG TABS tablet Take 1 tablet (25 mg total) by mouth daily before breakfast.   . furosemide (LASIX) 20 MG tablet Take 1.5 tablets (30 mg) by mouth once daily   . gabapentin (NEURONTIN) 100 MG capsule Take 1 capsule (100 mg total) by mouth at bedtime.   .  hydrALAZINE (APRESOLINE) 50 MG tablet Take 1 tablet (50 mg total) by mouth 3 (three) times daily.   . Lancets (ACCU-CHEK SOFT TOUCH) lancets Use as instructed   . latanoprost (XALATAN) 0.005 % ophthalmic solution Place 1 drop into both eyes at bedtime.    . Magnesium Oxide 400 (240 Mg) MG TABS Take 1 tablet (400 mg total) by mouth 2 (two) times daily.   . metFORMIN (GLUCOPHAGE) 500 MG tablet Take 1 tablet (500 mg total) by mouth 2 (two) times daily with a meal.   . naproxen sodium (ALEVE) 220 MG tablet Take 220 mg by mouth 2 (two) times daily as needed.  08/07/2017: Patient reports taking at 2 to 3 tablets a day.   [name] ULTRA test strip USE TO CHECK SUGAR LEVELS TWICE DAILY.   . pantoprazole (PROTONIX) 40 MG tablet Take 1 tablet (40 mg total) by mouth daily.   . Polyethylene Glycol 3350 (MIRALAX PO) Take by mouth as needed.   . propranolol ER (INDERAL LA) 120 MG 24 hr capsule Take 1 capsule (120 mg total) by mouth daily.   . tamsulosin (FLOMAX) 0.4 MG CAPS capsule Take 1 capsule (0.4 mg total) by mouth daily.    No facility-administered encounter medications on file as of 09/27/2020.   Recent Relevant Labs: Lab Results  Component Value Date/Time   HGBA1C 7.5 (H) 08/30/2020 02:22 PM   HGBA1C 7.0 (H) 05/31/2020 02:57 PM  MICROALBUR 10 02/16/2020 04:20 PM   MICROALBUR 80 (H) 12/13/2018 02:18 PM    Kidney Function Lab Results  Component Value Date/Time   CREATININE 1.37 (H) 08/30/2020 02:36 PM   CREATININE 1.31 (H) 06/10/2020 06:56 PM   GFRNONAA 53 (L) 06/10/2020 06:56 PM   GFRAA 61 02/16/2020 03:50 PM   Spoke with patient's daughter, Todd [name], who responded to all of the following on behalf of Todd [name]. She, as well as her brother and sister- in- law with whom Todd [name] resides with, take care or Todd [name]. She states that he has been doing good. He does not get much activity due to getting winded easily. Todd [name] stated patient's BP has been stable. Values given as follow:  143/53,151/79,138/73,132/60  Current antihyperglycemic regimen:  Jardiance 18m- one tab before breakfast  Metformin 5028m one tab twice daily   What recent interventions/DTPs have been made to improve glycemic control:  No recent interventions  Have there been any recent hospitalizations or ED visits since last visit with CPP? No    Patient denies hypoglycemic symptoms  Patient reports hyperglycemic symptoms, including weakness and sleepiness  How often are you checking your blood sugar?  Patient's daughter stated that they check his BS before breakfast every other day  What are your blood sugars ranging?  o Fasting: 186,185 o Before meals: n/a o After meals: n/a o Bedtime: n/a  During the week, how often does your blood glucose drop below 70? Never  Are you checking your feet daily/regularly?  Patient's daughter stated that they [name] not check Todd [name], but they [name] give him a bath and [name] not notice anything abnormal.  Adherence Review: Is the patient currently on a STATIN medication? Yes  Is the patient currently on ACE/ARB medication? No   Does the patient have >5 day gap between last estimated fill dates? No Jardiance 25 mg 90 DS last filled 08/30/20  Star Rating Drugs: Atorvastatin 2057m90DS last filled 06/29/20. Patient stated a refill was called in this morning Empagliflozin 76m42m0DS last filled 08/30/20 Metformin 500mg7mDS last fille 07/30/20  AshleWilford Sports CMA

## 2020-10-09 DIAGNOSIS — I2510 Atherosclerotic heart disease of native coronary artery without angina pectoris: Secondary | ICD-10-CM | POA: Diagnosis not present

## 2020-10-09 DIAGNOSIS — I501 Left ventricular failure: Secondary | ICD-10-CM | POA: Diagnosis not present

## 2020-10-09 DIAGNOSIS — R0902 Hypoxemia: Secondary | ICD-10-CM | POA: Diagnosis not present

## 2020-10-18 ENCOUNTER — Ambulatory Visit: Payer: PPO

## 2020-10-22 ENCOUNTER — Ambulatory Visit: Payer: HMO

## 2020-10-25 ENCOUNTER — Ambulatory Visit (INDEPENDENT_AMBULATORY_CARE_PROVIDER_SITE_OTHER): Payer: HMO

## 2020-10-25 VITALS — Ht 68.0 in | Wt 173.0 lb

## 2020-10-25 DIAGNOSIS — Z0000 Encounter for general adult medical examination without abnormal findings: Secondary | ICD-10-CM | POA: Diagnosis not present

## 2020-10-25 NOTE — Progress Notes (Signed)
I connected with Todd [name] today by telephone and verified that I am speaking with the correct person using two identifiers. Location patient: home Location provider: work Persons participating in the virtual visit: Kwamane Whack, [name] (daughter), Boden Stucky (daughter in law), Glenna Durand LPN.   I discussed the limitations, risks, security and privacy concerns of performing an evaluation and management service by telephone and the availability of in person appointments. I also discussed with the patient that there may be a patient responsible charge related to this service. The patient expressed understanding and verbally consented to this telephonic visit.    Interactive audio and video telecommunications were attempted between this provider and patient, however failed, due to patient having technical difficulties OR patient did not have access to video capability.  We continued and completed visit with audio only.     Vital signs may be patient reported or missing.  Subjective:   Todd [name] is a 87 y.o. male who presents for Medicare Annual/Subsequent preventive examination.  Review of Systems     Cardiac Risk Factors include: advanced age (>77mn, >>39women);diabetes mellitus;male gender;sedentary lifestyle     Objective:    Today's Vitals   10/25/20 1514  Weight: 173 lb (78.5 kg)  Height: 5' 8"  (1.727 m)   Body mass index is 26.3 kg/m.  Advanced Directives 10/25/2020 06/10/2020 10/16/2019 04/29/2019 11/11/2018 11/11/2018 11/11/2018  Does Patient Have a Medical Advance Directive? No No No No No No Unable to assess, patient is non-responsive or altered mental status  Type of Advance Directive - - - - - - -  Copy of Healthcare Power of Attorney in Chart? - - - - - - -  Would patient like information on creating a medical advance directive? - - - - No - Patient declined - -    Current Medications (verified) Outpatient Encounter Medications as of 10/25/2020  Medication  Sig  . acetaminophen (TYLENOL) 650 MG CR tablet Take 650 mg by mouth every 8 (eight) hours as needed for pain.  .[name] Kitchenapixaban (ELIQUIS) 5 MG TABS tablet Take 1 tablet (5 mg total) by mouth 2 (two) times daily.  .[name] Kitchenatorvastatin (LIPITOR) 20 MG tablet Take 1 tablet (20 mg total) by mouth daily.  . blood glucose meter kit and supplies KIT Dispense based on patient and insurance preference. Use up to four times daily as directed. (FOR ICD-9 250.00, 250.01).  . Blood Glucose Monitoring Suppl (ACCU-CHEK AVIVA PLUS) w/Device KIT Use to check sugar levels x 2 daily  . carbidopa-levodopa (SINEMET IR) 25-100 MG tablet Take 0.5 tablets by mouth 3 (three) times daily.   . digoxin (LANOXIN) 0.125 MG tablet Take 1 tablet (0.125 mg total) by mouth every other day.  . docusate sodium (COLACE) 100 MG capsule Take 1 capsule (100 mg total) by mouth 2 (two) times daily as needed for mild constipation.  . DULoxetine (CYMBALTA) 60 MG capsule Take 1 capsule (60 mg total) by mouth daily.  . empagliflozin (JARDIANCE) 25 MG TABS tablet Take 1 tablet (25 mg total) by mouth daily before breakfast.  . furosemide (LASIX) 20 MG tablet Take 1.5 tablets (30 mg) by mouth once daily  . gabapentin (NEURONTIN) 100 MG capsule Take 1 capsule (100 mg total) by mouth at bedtime.  . hydrALAZINE (APRESOLINE) 50 MG tablet Take 1 tablet (50 mg total) by mouth 3 (three) times daily.  . Lancets (ACCU-CHEK SOFT TOUCH) lancets Use as instructed  . latanoprost (XALATAN) 0.005 % ophthalmic solution Place 1 drop into  both eyes at bedtime.   . Magnesium Oxide 400 (240 Mg) MG TABS Take 1 tablet (400 mg total) by mouth 2 (two) times daily.  . metFORMIN (GLUCOPHAGE) 500 MG tablet Take 1 tablet (500 mg total) by mouth 2 (two) times daily with a meal.  . naproxen sodium (ALEVE) 220 MG tablet Take 220 mg by mouth 2 (two) times daily as needed.   [name] ULTRA test strip USE TO CHECK SUGAR LEVELS TWICE DAILY.  . pantoprazole (PROTONIX) 40 MG tablet Take 1  tablet (40 mg total) by mouth daily.  . Polyethylene Glycol 3350 (MIRALAX PO) Take by mouth as needed.  . propranolol ER (INDERAL LA) 120 MG 24 hr capsule Take 1 capsule (120 mg total) by mouth daily.  . tamsulosin (FLOMAX) 0.4 MG CAPS capsule Take 1 capsule (0.4 mg total) by mouth daily.   No facility-administered encounter medications on file as of 10/25/2020.    Allergies (verified) Amlodipine, Fluoxetine, and Meloxicam   History: Past Medical History:  Diagnosis Date  . Anxiety   . Aortic regurgitation    a. 03/2017 Echo: Mod AI; b. 05/2017 Echo: Triv AI.  [name] Kitchen Arthritis    osteoarthritis-left knee  . CAD (coronary artery disease)    a. Reported h/o cath ~ 2008 - ? small vessel dzs. No PCI performed; b. 03/2017 MV: EF 45-54%, no ischemia/infarct.  . Depression   . Diabetes mellitus without complication ([place])   . Diastolic dysfunction    a. 03/2017 Echo: EF 55-65%; b. 05/2017 Echo: EF 55-60%, no rwma, Gr1 DD. Triv AI.  Mildly dil LA.  . ED (erectile dysfunction)   . Elevated troponin    a. chronic mild elevation - 0.03->0.04.  [name] Kitchen GERD (gastroesophageal reflux disease)   . Glaucoma   . Hyperlipidemia   . Hypertension   . Iron deficiency anemia due to chronic blood loss   . Left pontine stroke w/ cerebrovascular disease([facility])    a. 05/2017 MRI/A: acute to subacute L pontine infarct. Chronic left thalamic lacunar infarct. Severe basilar artery stenosis w/ radiographic string sign of the distal 1/3. Mild to moderate L PCA P2 segment stenosis; b. 05/2017 Carotid U/S: <50% bilat ICA stenosis.  . Lumbago    Lumbosacral Neuritis  . Prostate cancer [facility])    Past Surgical History:  Procedure Laterality Date  . CARPAL TUNNEL RELEASE    . REPLACEMENT TOTAL KNEE BILATERAL     Family History  Problem Relation Age of Onset  . Stroke Mother   . Hypertension Mother   . Hypertension Father   . Stroke Sister   . Prostate cancer Neg Hx   . Kidney cancer Neg Hx   . Bladder Cancer Neg Hx     Social History   Socioeconomic History  . Marital status: Widowed    Spouse name: Not on file  . Number of children: Not on file  . Years of education: Not on file  . Highest education level: Not on file  Occupational History  . Occupation: retired  Tobacco Use  . Smoking status: Former Smoker    Quit date: 12/02/1994    Years since quitting: 25.9  . Smokeless tobacco: Never Used  Vaping Use  . Vaping Use: Never used  Substance and Sexual Activity  . Alcohol use: Yes    Alcohol/week: 1.0 standard drink    Types: 1 Cans of beer per week    Comment: occasional  . Drug use: No  . Sexual activity: Not Currently  Other  Topics Concern  . Not on file  Social History Narrative   Lives at home with family.  Able to ambulate with a walker.   Social Determinants of Health   Financial Resource Strain: Low Risk   . Difficulty of Paying Living Expenses: Not hard at all  Food Insecurity: No Food Insecurity  . Worried About Charity fundraiser in the Last Year: Never true  . Ran Out of Food in the Last Year: Never true  Transportation Needs: No Transportation Needs  . Lack of Transportation (Medical): No  . Lack of Transportation (Non-Medical): No  Physical Activity: Inactive  . Days of Exercise per Week: 0 days  . Minutes of Exercise per Session: 0 min  Stress: No Stress Concern Present  . Feeling of Stress : Not at all  Social Connections: Not on file    Tobacco Counseling Counseling given: Not Answered   Clinical Intake:  Pre-visit preparation completed: Yes  Pain : No/denies pain     Nutritional Status: BMI 25 -29 Overweight Nutritional Risks: None Diabetes: Yes     Diabetic? Yes Nutrition Risk Assessment:  Has the patient had any N/V/D within the last 2 months?  No  Does the patient have any non-healing wounds?  No  Has the patient had any unintentional weight loss or weight gain?  No   Diabetes:  Is the patient diabetic?  Yes  If diabetic, was a CBG  obtained today?  No  Did the patient bring in their glucometer from home?  No  How often do you monitor your CBG's? Every other day.   Financial Strains and Diabetes Management:  Are you having any financial strains with the device, your supplies or your medication? No .  Does the patient want to be seen by Chronic Care Management for management of their diabetes?  No  Would the patient like to be referred to a Nutritionist or for Diabetic Management?  No   Diabetic Exams:  Diabetic Eye Exam: Completed 01/28/2020 Diabetic Foot Exam: Completed 05/31/2020   Interpreter Needed?: No  Information entered by :: NAllen LPN   Activities of Daily Living In your present state of health, do you have any difficulty performing the following activities: 10/25/2020  Hearing? N  Vision? N  Difficulty concentrating or making decisions? N  Walking or climbing stairs? Y  Dressing or bathing? Y  Doing errands, shopping? Y  Preparing Food and eating ? Y  Using the Toilet? Y  In the past six months, have you accidently leaked urine? N  Do you have problems with loss of bowel control? N  Managing your Medications? Y  Managing your Finances? Y  Housekeeping or managing your Housekeeping? Y  Some recent data might be hidden    Patient Care Team: Valerie Roys, DO as PCP - General (Family Medicine) Minna Merritts, MD as PCP - Cardiology (Cardiology) Collier Flowers, MD as Referring Physician (Urology) Birder Robson, MD as Referring Physician (Ophthalmology) Reche Dixon, PA-C (Orthopedic Surgery) Vladimir Crofts, MD (Neurology) Vladimir Faster, [facility] (Pharmacist)  Indicate any recent Medical Services you may have received from other than [facility] providers in the past year (date may be approximate).     Assessment:   This is a routine wellness examination for Todd [name].  Hearing/Vision screen  Hearing Screening   125Hz  250Hz  500Hz  1000Hz  2000Hz  3000Hz  4000Hz  6000Hz  8000Hz   Right ear:            Left ear:  Vision Screening Comments: Regular eye exams, [facility]  Dietary issues and exercise activities discussed: Current Exercise Habits: The patient does not participate in regular exercise at present  Goals Addressed            This Visit's Progress   . Patient Stated       10/25/2020, no goals      Depression Screen PHQ 2/9 Scores 10/25/2020 05/31/2020 02/24/2020 11/10/2019 04/25/2018 10/26/2017 07/04/2017  PHQ - 2 Score 0 3 6 6 6  0 1  PHQ- 9 Score - 10 18 19 19  - -    Fall Risk Fall Risk  10/25/2020 02/24/2020 11/10/2019 10/16/2019 12/12/2018  Falls in the past year? 1 1 1 1 1   Comment lost balance - - - -  Number falls in past yr: 1 1 1  0 1  Injury with Fall? 0 1 0 0 1  Risk for fall due to : Impaired balance/gait;Impaired mobility;Medication side effect Impaired mobility;Impaired balance/gait;History of fall(s);Medication side effect History of fall(s);Impaired balance/gait;Impaired mobility - History of fall(s)  Follow up Falls evaluation completed;Education provided;Falls prevention discussed - - - Falls evaluation completed    FALL RISK PREVENTION PERTAINING TO THE HOME:  Any stairs in or around the home? No  If so, are there any without handrails? N/a ramp Home free of loose throw rugs in walkways, pet beds, electrical cords, etc? Yes  Adequate lighting in your home to reduce risk of falls? Yes   ASSISTIVE DEVICES UTILIZED TO PREVENT FALLS:  Life alert? No  Use of a cane, walker or w/c? Yes  Grab bars in the bathroom? Yes  Shower chair or bench in shower? Yes  Elevated toilet seat or a handicapped toilet? Yes   TIMED UP AND GO:  Was the test performed? No .    Cognitive Function:     6CIT Screen 10/25/2016  What Year? 0 points  What month? 0 points  What time? 3 points  Count back from 20 0 points  Months in reverse 4 points  Repeat phrase 10 points  Total Score 17    Immunizations Immunization History  Administered Date(s)  Administered  . Fluad Quad(high Dose 65+) 02/11/2019, 02/16/2020  . Influenza, High Dose Seasonal PF 03/15/2016, 07/02/2017, 03/06/2018  . Influenza,inj,Quad PF,6+ Mos 03/05/2015, 04/01/2015  . Influenza-Unspecified 03/17/2014  . Moderna Sars-Covid-2 Vaccination 08/06/2019, 09/03/2019  . Pneumococcal Conjugate-13 03/17/2014  . Pneumococcal Polysaccharide-23 03/05/2001  . Td 10/22/2003  . Zoster 07/12/2009    TDAP status: Due, Education has been provided regarding the importance of this vaccine. Advised may receive this vaccine at local pharmacy or Health Dept. Aware to provide a copy of the vaccination record if obtained from local pharmacy or Health Dept. Verbalized acceptance and understanding.  Flu Vaccine status: Up to date  Pneumococcal vaccine status: Up to date  Covid-19 vaccine status: Completed vaccines  Qualifies for Shingles Vaccine? Yes   Zostavax completed Yes   Shingrix Completed?: No.    Education has been provided regarding the importance of this vaccine. Patient has been advised to call insurance company to determine out of pocket expense if they have not yet received this vaccine. Advised may also receive vaccine at local pharmacy or Health Dept. Verbalized acceptance and understanding.  Screening Tests Health Maintenance  Topic Date Due  . TETANUS/TDAP  10/21/2013  . COVID-19 Vaccine (3 - Moderna risk 4-dose series) 10/01/2019  . INFLUENZA VACCINE  01/03/2021  . OPHTHALMOLOGY EXAM  01/27/2021  . HEMOGLOBIN A1C  03/02/2021  .  FOOT EXAM  05/31/2021  . PNA vac Low Risk Adult  Completed  . HPV VACCINES  Aged Out    Health Maintenance  Health Maintenance Due  Topic Date Due  . TETANUS/TDAP  10/21/2013  . COVID-19 Vaccine (3 - Moderna risk 4-dose series) 10/01/2019    Colorectal cancer screening: No longer required.   Lung Cancer Screening: (Low Dose CT Chest recommended if Age 47-80 years, 30 pack-year currently smoking OR have quit w/in 15years.) does not  qualify.   Lung Cancer Screening Referral: no  Additional Screening:  Hepatitis C Screening: does not qualify;   Vision Screening: Recommended annual ophthalmology exams for early detection of glaucoma and other disorders of the eye. Is the patient up to date with their annual eye exam?  Yes  Who is the provider or what is the name of the office in which the patient attends annual eye exams? [facility] If pt is not established with a provider, would they like to be referred to a provider to establish care? No .   Dental Screening: Recommended annual dental exams for proper oral hygiene  Community Resource Referral / Chronic Care Management: CRR required this visit?  No   CCM required this visit?  No      Plan:     I have personally reviewed and noted the following in the patient's chart:   . Medical and social history . Use of alcohol, tobacco or illicit drugs  . Current medications and supplements including opioid prescriptions. Patient is not currently taking opioid prescriptions. . Functional ability and status . Nutritional status . Physical activity . Advanced directives . List of other physicians . Hospitalizations, surgeries, and ER visits in previous 12 months . Vitals . Screenings to include cognitive, depression, and falls . Referrals and appointments  In addition, I have reviewed and discussed with patient certain preventive protocols, quality metrics, and best practice recommendations. A written personalized care plan for preventive services as well as general preventive health recommendations were provided to patient.     Kellie Simmering, LPN   [id number]   Nurse Notes: All questions answered by daughter and daughter in law. 6 CIT not administered.

## 2020-10-25 NOTE — Patient Instructions (Signed)
Todd [name] , Thank you for taking time to come for your Medicare Wellness Visit. I appreciate your ongoing commitment to your health goals. Please review the following plan we discussed and let me know if I can assist you in the future.   Screening recommendations/referrals: Colonoscopy: not required Recommended yearly ophthalmology/optometry visit for glaucoma screening and checkup Recommended yearly dental visit for hygiene and checkup  Vaccinations: Influenza vaccine: completed 02/16/2020, due 01/03/2021 Pneumococcal vaccine: completed 03/17/2014 Tdap vaccine: due Shingles vaccine: discussed   Covid-19:  09/03/2019, 08/06/2019  Advanced directives: Advance directive discussed with you today.   Conditions/risks identified: none  Next appointment: Follow up in one year for your annual wellness visit.   Preventive Care 4 Years and Older, Male Preventive care refers to lifestyle choices and visits with your health care provider that can promote health and wellness. What does preventive care include?  A yearly physical exam. This is also called an annual well check.  Dental exams once or twice a year.  Routine eye exams. Ask your health care provider how often you should have your eyes checked.  Personal lifestyle choices, including:  Daily care of your teeth and gums.  Regular physical activity.  Eating a healthy diet.  Avoiding tobacco and drug use.  Limiting alcohol use.  Practicing safe sex.  Taking low doses of aspirin every day.  Taking vitamin and mineral supplements as recommended by your health care provider. What happens during an annual well check? The services and screenings done by your health care provider during your annual well check will depend on your age, overall health, lifestyle risk factors, and family history of disease. Counseling  Your health care provider may ask you questions about your:  Alcohol use.  Tobacco use.  Drug use.  Emotional  well-being.  Home and relationship well-being.  Sexual activity.  Eating habits.  History of falls.  Memory and ability to understand (cognition).  Work and work Statistician. Screening  You may have the following tests or measurements:  Height, weight, and BMI.  Blood pressure.  Lipid and cholesterol levels. These may be checked every 5 years, or more frequently if you are over 59 years old.  Skin check.  Lung cancer screening. You may have this screening every year starting at age 64 if you have a 30-pack-year history of smoking and currently smoke or have quit within the past 15 years.  Fecal occult blood test (FOBT) of the stool. You may have this test every year starting at age 60.  Flexible sigmoidoscopy or colonoscopy. You may have a sigmoidoscopy every 5 years or a colonoscopy every 10 years starting at age 76.  Prostate cancer screening. Recommendations will vary depending on your family history and other risks.  Hepatitis C blood test.  Hepatitis B blood test.  Sexually transmitted disease (STD) testing.  Diabetes screening. This is done by checking your blood sugar (glucose) after you have not eaten for a while (fasting). You may have this done every 1-3 years.  Abdominal aortic aneurysm (AAA) screening. You may need this if you are a current or former smoker.  Osteoporosis. You may be screened starting at age 65 if you are at high risk. Talk with your health care provider about your test results, treatment options, and if necessary, the need for more tests. Vaccines  Your health care provider may recommend certain vaccines, such as:  Influenza vaccine. This is recommended every year.  Tetanus, diphtheria, and acellular pertussis (Tdap, Td) vaccine. You may  need a Td booster every 10 years.  Zoster vaccine. You may need this after age 16.  Pneumococcal 13-valent conjugate (PCV13) vaccine. One dose is recommended after age 29.  Pneumococcal  polysaccharide (PPSV23) vaccine. One dose is recommended after age 15. Talk to your health care provider about which screenings and vaccines you need and how often you need them. This information is not intended to replace advice given to you by your health care provider. Make sure you discuss any questions you have with your health care provider. Document Released: 06/18/2015 Document Revised: 02/09/2016 Document Reviewed: 03/23/2015 Elsevier Interactive Patient Education  2017 [place] Prevention in the Home Falls can cause injuries. They can happen to people of all ages. There are many things you can do to make your home safe and to help prevent falls. What can I do on the outside of my home?  Regularly fix the edges of walkways and driveways and fix any cracks.  Remove anything that might make you trip as you walk through a door, such as a raised step or threshold.  Trim any bushes or trees on the path to your home.  Use bright outdoor lighting.  Clear any walking paths of anything that might make someone trip, such as rocks or tools.  Regularly check to see if handrails are loose or broken. Make sure that both sides of any steps have handrails.  Any raised decks and porches should have guardrails on the edges.  Have any leaves, snow, or ice cleared regularly.  Use sand or salt on walking paths during winter.  Clean up any spills in your garage right away. This includes oil or grease spills. What can I do in the bathroom?  Use night lights.  Install grab bars by the toilet and in the tub and shower. Do not use towel bars as grab bars.  Use non-skid mats or decals in the tub or shower.  If you need to sit down in the shower, use a plastic, non-slip stool.  Keep the floor dry. Clean up any water that spills on the floor as soon as it happens.  Remove soap buildup in the tub or shower regularly.  Attach bath mats securely with double-sided non-slip rug  tape.  Do not have throw rugs and other things on the floor that can make you trip. What can I do in the bedroom?  Use night lights.  Make sure that you have a light by your bed that is easy to reach.  Do not use any sheets or blankets that are too big for your bed. They should not hang down onto the floor.  Have a firm chair that has side arms. You can use this for support while you get dressed.  Do not have throw rugs and other things on the floor that can make you trip. What can I do in the kitchen?  Clean up any spills right away.  Avoid walking on wet floors.  Keep items that you use a lot in easy-to-reach places.  If you need to reach something above you, use a strong step stool that has a grab bar.  Keep electrical cords out of the way.  Do not use floor polish or wax that makes floors slippery. If you must use wax, use non-skid floor wax.  Do not have throw rugs and other things on the floor that can make you trip. What can I do with my stairs?  Do not leave any items on  the stairs.  Make sure that there are handrails on both sides of the stairs and use them. Fix handrails that are broken or loose. Make sure that handrails are as long as the stairways.  Check any carpeting to make sure that it is firmly attached to the stairs. Fix any carpet that is loose or worn.  Avoid having throw rugs at the top or bottom of the stairs. If you do have throw rugs, attach them to the floor with carpet tape.  Make sure that you have a light switch at the top of the stairs and the bottom of the stairs. If you do not have them, ask someone to add them for you. What else can I do to help prevent falls?  Wear shoes that:  Do not have high heels.  Have rubber bottoms.  Are comfortable and fit you well.  Are closed at the toe. Do not wear sandals.  If you use a stepladder:  Make sure that it is fully opened. Do not climb a closed stepladder.  Make sure that both sides of the  stepladder are locked into place.  Ask someone to hold it for you, if possible.  Clearly mark and make sure that you can see:  Any grab bars or handrails.  First and last steps.  Where the edge of each step is.  Use tools that help you move around (mobility aids) if they are needed. These include:  Canes.  Walkers.  Scooters.  Crutches.  Turn on the lights when you go into a dark area. Replace any light bulbs as soon as they burn out.  Set up your furniture so you have a clear path. Avoid moving your furniture around.  If any of your floors are uneven, fix them.  If there are any pets around you, be aware of where they are.  Review your medicines with your doctor. Some medicines can make you feel dizzy. This can increase your chance of falling. Ask your doctor what other things that you can do to help prevent falls. This information is not intended to replace advice given to you by your health care provider. Make sure you discuss any questions you have with your health care provider. Document Released: 03/18/2009 Document Revised: 10/28/2015 Document Reviewed: 06/26/2014 Elsevier Interactive Patient Education  2017 [employer].

## 2020-10-27 ENCOUNTER — Ambulatory Visit (INDEPENDENT_AMBULATORY_CARE_PROVIDER_SITE_OTHER): Payer: HMO | Admitting: Pharmacist

## 2020-10-27 ENCOUNTER — Telehealth: Payer: Self-pay | Admitting: Pharmacist

## 2020-10-27 DIAGNOSIS — E1142 Type 2 diabetes mellitus with diabetic polyneuropathy: Secondary | ICD-10-CM | POA: Diagnosis not present

## 2020-10-27 DIAGNOSIS — I10 Essential (primary) hypertension: Secondary | ICD-10-CM

## 2020-10-27 NOTE — Progress Notes (Signed)
Chronic Care Management Pharmacy Note  11/02/2020 Name:  Todd [name] MRN:  [id number] DOB:  [birth year]  Subjective: Todd [name] is an 87 y.o. year old male who is a primary patient of Valerie Roys, DO.  The CCM team was consulted for assistance with disease management and care coordination needs.    Engaged with patient by telephone for follow up visit in response to provider referral for pharmacy case management and/or care coordination services. Spoke with daughter, Todd [name], on DPr.  Consent to Services:  The patient was given information about Chronic Care Management services, agreed to services, and gave verbal consent prior to initiation of services.  Please see initial visit note for detailed documentation.   Patient Care Team: Valerie Roys, DO as PCP - General (Family Medicine) Minna Merritts, MD as PCP - Cardiology (Cardiology) Collier Flowers, MD as Referring Physician (Urology) Birder Robson, MD as Referring Physician (Ophthalmology) Reche Dixon, PA-C (Orthopedic Surgery) Vladimir Crofts, MD (Neurology) Vladimir Faster, [facility] (Pharmacist)  Recent office visits: 08/30/20-Todd [name](PCP)- blood work, vit d low, a1c up, 05/31/21- Todd [name](PCP)-no med changes Recent consult visits: 09/22/20-Todd [name] (neuro) -Supsected Parkinson's increase Sinemet to 1 tab tid, left hand muscle atrophy reduce cymbalta to 30 mg daily x 1 month then stop 05/19/20- Urology , [facility] visits: 06/10/20-Todd [name]- ED [facility] SOB- cxr clear, COVID neg   Objective:  Lab Results  Component Value Date   CREATININE 1.37 (H) 08/30/2020   BUN 18 08/30/2020   GFRNONAA 53 (L) 06/10/2020   GFRAA 61 02/16/2020   NA 141 08/30/2020   K 5.2 08/30/2020   CALCIUM 9.8 08/30/2020   CO2 28 08/30/2020   GLUCOSE 165 (H) 08/30/2020    Lab Results  Component Value Date/Time   HGBA1C 7.5 (H) 08/30/2020 02:22 PM   HGBA1C 7.0 (H) 05/31/2020 02:57 PM   MICROALBUR 10 02/16/2020 04:20 PM   MICROALBUR 80  (H) 12/13/2018 02:18 PM    Last diabetic Eye exam:  Lab Results  Component Value Date/Time   HMDIABEYEEXA No Retinopathy 01/28/2020 12:00 AM    Last diabetic Foot exam: No results found for: HMDIABFOOTEX   Lab Results  Component Value Date   CHOL 141 08/30/2020   HDL 45 08/30/2020   LDLCALC 67 08/30/2020   TRIG 174 (H) 08/30/2020   CHOLHDL 3.4 05/18/2017    Hepatic Function Latest Ref Rng & Units 08/30/2020 06/10/2020 02/16/2020  Total Protein 6.0 - 8.5 g/dL 6.7 7.5 6.9  Albumin 3.6 - 4.6 g/dL 4.5 4.1 4.4  AST 0 - 40 IU/L 15 16 13   ALT 0 - 44 IU/L 10 10 5   Alk Phosphatase 44 - 121 IU/L 90 85 83  Total Bilirubin 0.0 - 1.2 mg/dL 0.4 0.5 0.2  Bilirubin, Direct 0.0 - 0.2 mg/dL - - -    Lab Results  Component Value Date/Time   TSH 0.843 08/30/2020 02:36 PM   TSH 0.843 02/16/2020 03:50 PM    CBC Latest Ref Rng & Units 08/30/2020 06/10/2020 02/16/2020  WBC 3.4 - 10.8 x10E3/uL 6.5 6.8 7.0  Hemoglobin 13.0 - 17.7 g/dL 10.9(L) 10.2(L) 10.9(L)  Hematocrit 37.5 - 51.0 % 32.6(L) 33.0(L) 33.1(L)  Platelets 150 - 450 x10E3/uL 243 263 230    Lab Results  Component Value Date/Time   VD25OH 26.5 (L) 08/30/2020 02:36 PM   VD25OH 42.7 12/12/2018 01:50 PM    Clinical ASCVD: Yes  The ASCVD Risk score Todd [name]) failed to calculate for the following  reasons:   The [name] ASCVD risk score is only valid for ages 37 to 87   The patient has a prior MI or stroke diagnosis    Depression screen [facility] 2/9 10/25/2020 05/31/2020 02/24/2020  Decreased Interest 0 0 3  Down, Depressed, Hopeless 0 3 3  PHQ - 2 Score 0 3 6  Altered sleeping - 3 3  Tired, decreased energy - 3 3  Change in appetite - 0 0  Feeling bad or failure about yourself  - 0 3  Trouble concentrating - 1 0  Moving slowly or fidgety/restless - 0 3  Suicidal thoughts - 0 0  PHQ-9 Score - 10 18  Difficult doing work/chores - Not difficult at all Very difficult  Some recent data might be hidden   CHA2DS2/VAS Stroke  Risk Points  Current as of 13 minutes ago     8 >= 2 Points: High Risk  1 - 1.99 Points: Medium Risk  0 Points: Low Risk    Last Change: N/A         Social History   Tobacco Use  Smoking Status Former Smoker  . Quit date: 12/02/1994  . Years since quitting: 25.9  Smokeless Tobacco Never Used   BP Readings from Last 3 Encounters:  08/30/20 132/77  06/10/20 130/79  05/31/20 (!) 160/79   Pulse Readings from Last 3 Encounters:  08/30/20 62  06/10/20 76  05/31/20 69   Wt Readings from Last 3 Encounters:  10/25/20 173 lb (78.5 kg)  08/30/20 173 lb (78.5 kg)  06/10/20 182 lb 15.7 oz (83 kg)   BMI Readings from Last 3 Encounters:  10/25/20 26.30 kg/m  08/30/20 26.30 kg/m  06/10/20 27.82 kg/m    Assessment/Interventions: Review of patient past medical history, allergies, medications, health status, including review of consultants reports, laboratory and other test data, was performed as part of comprehensive evaluation and provision of chronic care management services.   SDOH:  (Social Determinants of Health) assessments and interventions performed: No  SDOH Screenings   Alcohol Screen: Not on file  Depression (PHQ2-9): Low Risk   . PHQ-2 Score: 0  Financial Resource Strain: Low Risk   . Difficulty of Paying Living Expenses: Not hard at all  Food Insecurity: No Food Insecurity  . Worried About Charity fundraiser in the Last Year: Never true  . Ran Out of Food in the Last Year: Never true  Housing: Not on file  Physical Activity: Inactive  . Days of Exercise per Week: 0 days  . Minutes of Exercise per Session: 0 min  Social Connections: Not on file  Stress: No Stress Concern Present  . Feeling of Stress : Not at all  Tobacco Use: Medium Risk  . Smoking Tobacco Use: Former Smoker  . Smokeless Tobacco Use: Never Used  Transportation Needs: No Transportation Needs  . Lack of Transportation (Medical): No  . Lack of Transportation (Non-Medical): No      Immunization History  Administered Date(s) Administered  . Fluad Quad(high Dose 65+) 02/11/2019, 02/16/2020  . Influenza, High Dose Seasonal PF 03/15/2016, 07/02/2017, 03/06/2018  . Influenza,inj,Quad PF,6+ Mos 03/05/2015, 04/01/2015  . Influenza-Unspecified 03/17/2014  . Moderna Sars-Covid-2 Vaccination 08/06/2019, 09/03/2019  . Pneumococcal Conjugate-13 03/17/2014  . Pneumococcal Polysaccharide-23 03/05/2001  . Td 10/22/2003  . Zoster, Live 07/12/2009    Conditions to be addressed/monitored:  Hypertension, Hyperlipidemia, Diabetes, Atrial Fibrillation, Heart Failure, Coronary Artery Disease, GERD, Chronic Kidney Disease and Depression  Care Plan : [place]  Updates made by Vladimir Faster, [place] since 11/02/2020 12:00 AM    Problem: CAD, Dm, HTn, CKD, Afib, HFrEF,  Parkinson's disease, HLD h/o stroke with residual effects   Priority: High    Long-Range Goal: Disease Management   Start Date: 10/27/2020  This Visit's Progress: On track  Priority: High  Note:   Current Barriers:  . Unable to independently monitor therapeutic efficacy . Unable to self administer medications as prescribed  Pharmacist Clinical Goal(s):  [name] Kitchen Patient will achieve adherence to monitoring guidelines and medication adherence to achieve therapeutic efficacy . Maintain  control of BP, DM as evidenced by lab values, home readings through collaboration with PharmD and provider.   Interventions: . 1:1 collaboration with Valerie Roys, DO regarding development and update of comprehensive plan of care as evidenced by provider attestation and co-signature . Inter-disciplinary care team collaboration (see longitudinal plan of care) . Comprehensive medication review performed; medication list updated in electronic medical record  BP Readings from Last 3 Encounters:  08/30/20 132/77  06/10/20 130/79  05/31/20 (!) 160/79   Lab Results  Component Value Date   CREATININE 1.37 (H) 08/30/2020     Hypertension/CKD (BP goal <130/80) -Controlled -Current treatment: . hydralazine 50 mg tid . Furosemide 30 mg qd . Propranolol ER 120 mg qd -Medications previously tried: NA -Current home readings: unknown --Denies hypotensive/hypertensive symptoms -Educated on BP goals and benefits of medications for prevention of heart attack, stroke and kidney damage; Daily salt intake goal < 2300 mg; Importance of home blood pressure monitoring; Symptoms of hypotension and importance of maintaining adequate hydration; -Counseled to monitor BP at home 3 times weekly, document, and provide log at future appointments -Recommended to continue current medication  Lab Results  Component Value Date   Rosalia 67 08/30/2020    Hyperlipidemia/previous CVA with residual effects: (LDL goal <70) -Controlled -Current treatment: . Atorvastatin 20 mg qd -Medications previously tried: na  -C-Educated on Benefits of statin for ASCVD risk reduction; Importance of limiting foods high in cholesterol; -Recommended to continue current medication  Lab Results  Component Value Date   HGBA1C 7.5 (H) 08/30/2020   Lab Results  Component Value Date   CREATININE 1.37 (H) 08/30/2020   EGFR~21m/min Lab Results  Component Value Date   VITAMINB12 744 08/30/2020    Diabetes (A1c goal <7.5%) -Controlled -Current medications: . jardiance 25 mg qd . Metformin 500 mg bid -Medications previously tried: NA -Current home glucose readings . fasting glucose: unable to provide . post prandial glucose: unable to provide -Denies hypoglycemic/hyperglycemic symptoms - -Educated on A1c and blood sugar goals; Complications of diabetes including kidney damage, retinal damage, and cardiovascular disease; -Counseled to check feet daily and get yearly eye exams -Recommended to continue current medications -GArdeen Fillersstates patient's son and  daughter -in-law ([name] live with the patient and handle his  medications and monitoring. SEnid Derryis not listed on DPR and per GAlvis Lemmingsthey are in the process of assigning power of attorney for Mr. Amores. Advised her they can come to office and Mr. Patty can sign allowing DPR for son & daughter -in- law or bring copy of POA once assigned. Advised her to call the front office for more informations.  Atrial Fibrillation/RVR/ PH (Goal: prevent stroke and major bleeding) -Controlled -CHADSVASC: 8 -Current treatment: . Rate control: propranolol Xr 120 mg qd . Anticoagulation: Eliquis 5 mg bid -Medications previously tried: na -Home BP and HR readings: unable to provide  -Counseled on importance of adherence to anticoagulant  exactly as prescribed; bleeding risk associated with Eliquis and importance of self-monitoring for signs/symptoms of bleeding; avoidance of NSAIDs due to increased bleeding risk with anticoagulants; importance of regular laboratory monitoring; seeking medical attention after a head injury or if there is blood in the urine/stool; -Recommended to continue current medication  Heart Failure (Goal: manage symptoms and prevent exacerbations) -Controlled -Last ejection fraction: 50-65% (Date: 6/15./21) -HF type: Diastolic -NYHA Class: II (slight limitation of activity) -AHA HF Stage: C (Heart disease and symptoms present) -Current treatment: . Furosemide 30 mg qd . Digoxin 0/125 mg qod . propranolol er 120 mg qd . Hydralazine 50 mg tid -Medications previously tried: metoprolol -Current home BP/HR readings: unknown --Educated on Importance of blood pressure control -Recommended to continue current medication  Parkinson's disease (Goal: reduced symptoms) -Controlled -Current treatment  . Duloxetine 30 mg qd . Carbidopa/levodopa 25/100 mg 1 tab tid -Medications previously tried: na  -Recommended [name] follow up with neurology     Patient Goals/Self-Care Activities . Patient will:  - take medications as prescribed check glucose  three times weekly, document, and provide at future appointments check blood pressure threet imes weekly, document, and provide at future appointments collaborate with provider on medication access solutions  Follow Up Plan: Telephone follow up appointment with care management team member scheduled for: one month CPA, 2 months PharmD        Medication Assistance: None required.  Patient affirms current coverage meets needs.  Compliance/Adherence/Medication fill history: Care Gaps: None identified  Star-Rating Drugs: Atorvastatin  empagliflozin  metformin  Patient's preferred pharmacy is:  [place], [place] - [address] [place] [address] Phone: [phone] Fax: [phone]  [id number] [place]: [phone]: [address]Phone: [phone]: [phone] Uses pill box? Yes Daughter -in-law and som manage all meds Pt endorses 90% compliance  We discussed: Benefits of medication synchronization, packaging and delivery as well as enhanced pharmacist oversight with [employer]. Patient decided to: Continue current medication management strategy  Care Plan and Follow Up Patient Decision:  Patient agrees to Care Plan and Follow-up.  Plan: Telephone follow up appointment with care management team member scheduled for:  1 month  JJunita Push HKenton KingfisherPharmD, BSouth ValleyMNorth Palm Beach County Surgery [facility] [phone]

## 2020-11-02 DIAGNOSIS — S0512XA Contusion of eyeball and orbital tissues, left eye, initial encounter: Secondary | ICD-10-CM | POA: Diagnosis not present

## 2020-11-02 NOTE — Patient Instructions (Addendum)
Visit Information  It was a pleasure speaking with you today. Thank you for letting me be part of your clinical team. Please call with any questions or concerns.   Goals Addressed            This Visit's Progress   . Monitor and Manage My Blood Sugar-Diabetes Type 2       Timeframe:  Long-Range Goal Priority:  Medium Start Date:                             Expected End Date:                       Follow Up Date 2 month follow up   - check blood sugar at prescribed times - enter blood sugar readings and medication or insulin into daily log - take the blood sugar log to all doctor visits - take the blood sugar meter to all doctor visits    Why is this important?    Checking your blood sugar at home helps to keep it from getting very high or very low.   Writing the results in a diary or log helps the doctor know how to care for you.   Your blood sugar log should have the time, date and the results.   Also, write down the amount of insulin or other medicine that you take.   Other information, like what you ate, exercise done and how you were feeling, will also be helpful.     Notes:     . Track and Manage My Blood Pressure-Hypertension       Timeframe:  Long-Range Goal Priority:  High Start Date:                             Expected End Date:                       Follow Up Date 2 month follow up    - check blood pressure 3 times per week - write blood pressure results in a log or diary    Why is this important?    You won't feel high blood pressure, but it can still hurt your blood vessels.   High blood pressure can cause heart or kidney problems. It can also cause a stroke.   Making lifestyle changes like losing a little weight or eating less salt will help.   Checking your blood pressure at home and at different times of the day can help to control blood pressure.   If the doctor prescribes medicine remember to take it the way the doctor ordered.   Call the  office if you cannot afford the medicine or if there are questions about it.     Notes:        The patient verbalized understanding of instructions, educational materials, and care plan provided today and agreed to receive a mailed copy of patient instructions, educational materials, and care plan.   Telephone follow up appointment with pharmacy team member scheduled for: one month CPA  Junita Push. [place] PharmD, BCPS Clinical Pharmacist [phone]  Preventing Diabetes Mellitus Complications You can help to prevent or slow down problems that are caused by diabetes (diabetes mellitus). Following your diabetes plan and taking care of yourself can reduce your risk of serious or life-threatening complications. What actions can I  take to prevent diabetes complications? Diabetes management  Follow instructions from your health care providers about managing your diabetes. Your diabetes may be managed by a team of health care providers who can teach you how to care for yourself and can answer questions that you have.  Educate yourself about your condition so you can make healthy choices about eating and physical activity.  Know your target range for your blood sugar (glucose), and check your blood glucose level as often as told. Your health care provider will help you decide how often to check your blood glucose level depending on your treatment goals and how well you are meeting them.  Ask your health care provider if you should take low-dose aspirin daily and what dose is recommended for you. Taking low-dose aspirin daily is recommended to help prevent cardiovascular disease.   Controlling your blood pressure and cholesterol Your personal target blood pressure is determined based on:  Your age.  Your medicines.  How long you have had diabetes.  Any other medical conditions you have. To control your blood pressure:  Follow instructions from your health care provider about meal  planning, exercise, and medicines.  Make sure your health care provider checks your blood pressure at every medical visit.  Monitor your blood pressure at home as told by your health care provider. To control your cholesterol:  Follow instructions from your health care provider about meal planning, exercise, and medicines.  Have your cholesterol checked at least once a year.  You may be prescribed medicine to lower cholesterol (statin). If you are not taking a statin, ask your health care provider if you should be. Controlling your cholesterol may:  Help prevent heart disease and stroke. These are the most common health problems for people with diabetes.  Improve your blood flow.   Medical appointments and vaccines Schedule and keep yearly physical exams and eye exams. Your health care provider will tell you how often you need medical visits depending on your diabetes management plan. Keep all follow-up visits as told. This is important so possible problems can be identified early and complications can be avoided or treated.  Every visit with your health care provider should include measuring your: ? Weight. ? Blood pressure. ? Blood glucose control.  Your A1C (hemoglobin A1C) level should be checked: ? At least 2 times a year, if you are meeting your treatment goals. ? 4 times a year, if you are not meeting treatment goals or if your treatment goals have changed.  Your blood lipids (lipid profile) should be checked yearly. You should also be checked yearly for protein in your urine (urine microalbumin).  If you have type 1 diabetes, get an eye exam 3-5 years after you are diagnosed, and then once a year after your first exam.  If you have type 2 diabetes, get an eye exam as soon as you are diagnosed, and then once a year after your first exam. It is also important to keep your vaccines current. It is recommended that you receive:  A flu (influenza) vaccine every year.  A  pneumonia (pneumococcal) vaccine and a hepatitis B vaccine. If you are age 14 or older, you may get the pneumonia vaccine as a series of two separate shots. Ask your health care provider which other vaccines may be recommended. Lifestyle  Do not use any products that contain nicotine or tobacco, such as cigarettes, e-cigarettes, and chewing tobacco. If you need help quitting, ask your health care provider. By avoiding  nicotine and tobacco: ? You will lower your risk for heart attack, stroke, nerve disease, and kidney disease. ? Your cholesterol and blood pressure may improve. ? Your blood circulation will improve.  If you drink alcohol: ? Limit how much you use to:  0-1 drink a day for women who are not pregnant.  0-2 drinks a day for men. ? Be aware of how much alcohol is in your drink. In the [place] one drink equals one 12 oz bottle of beer (355 mL), one 5 oz glass of wine (148 mL), or one 11?2 oz glass of hard liquor (44 mL). Taking care of your feet Diabetes may cause you to have poor blood circulation to your legs and feet. Because of this, taking care of your feet is very important. Diabetes can cause:  The skin on the feet to get thinner, break more easily, and heal more slowly.  Nerve damage in your legs and feet, which results in decreased feeling. You may not notice minor injuries that could lead to serious problems. To avoid foot problems:  Check your skin and feet every day for cuts, bruises, redness, blisters, or sores.  Schedule a foot exam with your health care provider once every year. This exam includes: ? Inspecting the structure and skin of your feet. ? Checking the pulses and sensation in your feet.  Make sure that your health care provider performs a visual foot exam at every medical visit.   Taking care of your teeth People with poorly controlled diabetes are more likely to have gum (periodontal) disease. Diabetes can make periodontal diseases harder to control.  If not treated, periodontal diseases can lead to tooth loss. To prevent this:  Brush your teeth twice a day.  Floss at least once a day.  Visit your dentist 2 times a year. Managing stress Living with diabetes can be stressful. When you are experiencing stress, your blood glucose may be affected in two ways:  Stress hormones may cause your blood glucose to rise.  You may be distracted from taking good care of yourself. Be aware of your stress level and make changes to help you manage challenging situations. To lower your stress levels:  Consider joining a support group.  Do planned relaxation or meditation.  Do a hobby that you enjoy.  Maintain healthy relationships.  Exercise regularly.  Work with your health care provider or a mental health professional. Where to find more information  American Diabetes Association: www.diabetes.org  Association of Diabetes Care and Education Specialists: www.diabeteseducator.org Summary  You can take action to prevent or slow down problems that are caused by diabetes (diabetes mellitus). Following your diabetes plan and taking care of yourself can reduce your risk of serious or life-threatening complications.  Follow instructions from your health care providers about managing your diabetes. Your diabetes may be managed by a team of health care providers who can teach you how to care for yourself and can answer questions that you have.  Know your target range for your blood sugar (glucose), and check your blood glucose levels as often as told. Your health care provider will help you decide how often you should check your blood glucose level depending on your treatment goals and how well you are meeting them.  Your health care provider will tell you how often you need medical visits depending on your diabetes management plan. Keep all follow-up visits as directed. This is important so possible problems can be identified early and complications  can be  avoided or treated. This information is not intended to replace advice given to you by your health care provider. Make sure you discuss any questions you have with your health care provider. Document Revised: 07/11/2019 Document Reviewed: 07/11/2019 Elsevier Patient Education  2021 [employer].

## 2020-11-05 ENCOUNTER — Other Ambulatory Visit: Payer: Self-pay | Admitting: Family

## 2020-11-09 DIAGNOSIS — R0902 Hypoxemia: Secondary | ICD-10-CM | POA: Diagnosis not present

## 2020-11-09 DIAGNOSIS — I2510 Atherosclerotic heart disease of native coronary artery without angina pectoris: Secondary | ICD-10-CM | POA: Diagnosis not present

## 2020-11-09 DIAGNOSIS — I501 Left ventricular failure: Secondary | ICD-10-CM | POA: Diagnosis not present

## 2020-11-12 ENCOUNTER — Telehealth: Payer: Self-pay | Admitting: Pharmacist

## 2020-11-12 NOTE — Chronic Care Management (AMB) (Signed)
Chronic Care Management Pharmacy Assistant   Name: Devin Ganaway  MRN: [id number] DOB: 01-24-1931   Reason for Encounter: Disease State Hypertension   Recent office visits:  None noted  Recent consult visits:  None noted  Hospital visits:  None in previous 6 months  Medications: Outpatient Encounter Medications as of 11/12/2020  Medication Sig Note   acetaminophen (TYLENOL) 650 MG CR tablet Take 650 mg by mouth every 8 (eight) hours as needed for pain.    apixaban (ELIQUIS) 5 MG TABS tablet Take 1 tablet (5 mg total) by mouth 2 (two) times daily.    atorvastatin (LIPITOR) 20 MG tablet Take 1 tablet (20 mg total) by mouth daily.    blood glucose meter kit and supplies KIT Dispense based on patient and insurance preference. Use up to four times daily as directed. (FOR ICD-9 250.00, 250.01).    Blood Glucose Monitoring Suppl (ACCU-CHEK AVIVA PLUS) w/Device KIT Use to check sugar levels x 2 daily    carbidopa-levodopa (SINEMET IR) 25-100 MG tablet Take 1 tablet by mouth 3 (three) times daily.    digoxin (LANOXIN) 0.125 MG tablet Take 1 tablet (0.125 mg total) by mouth every other day.    docusate sodium (COLACE) 100 MG capsule Take 1 capsule (100 mg total) by mouth 2 (two) times daily as needed for mild constipation.    DULoxetine (CYMBALTA) 30 MG capsule Take 30 mg by mouth daily.    empagliflozin (JARDIANCE) 25 MG TABS tablet Take 1 tablet (25 mg total) by mouth daily before breakfast.    furosemide (LASIX) 20 MG tablet Take 1.5 tablets (30 mg) by mouth once daily    gabapentin (NEURONTIN) 100 MG capsule Take 1 capsule (100 mg total) by mouth at bedtime.    hydrALAZINE (APRESOLINE) 50 MG tablet Take 1 tablet (50 mg total) by mouth 3 (three) times daily.    Lancets (ACCU-CHEK SOFT TOUCH) lancets Use as instructed    latanoprost (XALATAN) 0.005 % ophthalmic solution Place 1 drop into both eyes at bedtime.     Magnesium Oxide 400 (240 Mg) MG TABS Take 1 tablet (400 mg total) by mouth  2 (two) times daily.    metFORMIN (GLUCOPHAGE) 500 MG tablet Take 1 tablet (500 mg total) by mouth 2 (two) times daily with a meal.    naproxen sodium (ALEVE) 220 MG tablet Take 220 mg by mouth 2 (two) times daily as needed.  08/07/2017: Patient reports taking at 2 to 3 tablets a day.    ONETOUCH ULTRA test strip USE TO CHECK SUGAR LEVELS TWICE DAILY.    pantoprazole (PROTONIX) 40 MG tablet Take 1 tablet (40 mg total) by mouth daily.    Polyethylene Glycol 3350 (MIRALAX PO) Take by mouth as needed.    propranolol ER (INDERAL LA) 120 MG 24 hr capsule Take 1 capsule (120 mg total) by mouth daily.    tamsulosin (FLOMAX) 0.4 MG CAPS capsule Take 1 capsule (0.4 mg total) by mouth daily.    No facility-administered encounter medications on file as of 11/12/2020.   Reviewed chart prior to disease state call. Spoke with patient regarding BP  Recent Office Vitals: BP Readings from Last 3 Encounters:  08/30/20 132/77  06/10/20 130/79  05/31/20 (!) 160/79   Pulse Readings from Last 3 Encounters:  08/30/20 62  06/10/20 76  05/31/20 69    Wt Readings from Last 3 Encounters:  10/25/20 173 lb (78.5 kg)  08/30/20 173 lb (78.5 kg)  06/10/20 182 lb 15.7  oz (83 kg)     Kidney Function Lab Results  Component Value Date/Time   CREATININE 1.37 (H) 08/30/2020 02:36 PM   CREATININE 1.31 (H) 06/10/2020 06:56 PM   GFRNONAA 53 (L) 06/10/2020 06:56 PM   GFRAA 61 02/16/2020 03:50 PM    BMP Latest Ref Rng & Units 08/30/2020 06/10/2020 02/16/2020  Glucose 65 - 99 mg/dL 165(H) 310(H) 171(H)  BUN 8 - 27 mg/dL 18 29(H) 28(H)  Creatinine 0.76 - 1.27 mg/dL 1.37(H) 1.31(H) 1.23  BUN/Creat Ratio 10 - 24 13 - 23  Sodium 134 - 144 mmol/L 141 137 140  Potassium 3.5 - 5.2 mmol/L 5.2 4.9 4.4  Chloride 96 - 106 mmol/L 98 97(L) 99  CO2 20 - 29 mmol/L 28 28 28   Calcium 8.6 - 10.2 mg/dL 9.8 9.1 9.6    Current antihypertensive regimen:  hydralazine 50 mg tid Furosemide 30 mg qd Propranolol ER 120 mg qd  How often  are you checking your Blood Pressure? daily  Current home BP readings:        Patient's daughter did not have a reading today.  What recent interventions/DTPs have been made by any provider to improve Blood Pressure control since last CPP Visit: None noted  Any recent hospitalizations or ED visits since last visit with CPP? No  What diet changes have been made to improve Blood Pressure Control?  Patients daughter states he has not made any diet changes.  What exercise is being done to improve your Blood Pressure Control?  Patients daughter states he does not exercise.   Adherence Review: Is the patient currently on ACE/ARB medication? No Does the patient have >5 day gap between last estimated fill dates? No   Star Rating Drugs: Jardiance 25 mg Last filled:11/02/20 None noted DS Metformin 500 mg Last filled:11/02/20 None noted DS Atorvastatin 20 mg Last filled:11/02/20 None noted DS  Corrie Mckusick, [place]

## 2020-11-18 ENCOUNTER — Other Ambulatory Visit: Payer: Self-pay

## 2020-11-19 ENCOUNTER — Encounter: Payer: Self-pay | Admitting: Urology

## 2020-11-23 ENCOUNTER — Other Ambulatory Visit: Payer: Self-pay | Admitting: Family Medicine

## 2020-11-29 ENCOUNTER — Encounter: Payer: Self-pay | Admitting: Family Medicine

## 2020-11-29 ENCOUNTER — Other Ambulatory Visit: Payer: Self-pay

## 2020-11-29 ENCOUNTER — Ambulatory Visit (INDEPENDENT_AMBULATORY_CARE_PROVIDER_SITE_OTHER): Payer: HMO | Admitting: Family Medicine

## 2020-11-29 VITALS — BP 116/58 | HR 56

## 2020-11-29 DIAGNOSIS — E1142 Type 2 diabetes mellitus with diabetic polyneuropathy: Secondary | ICD-10-CM

## 2020-11-29 DIAGNOSIS — I10 Essential (primary) hypertension: Secondary | ICD-10-CM

## 2020-11-29 DIAGNOSIS — E782 Mixed hyperlipidemia: Secondary | ICD-10-CM

## 2020-11-29 MED ORDER — GABAPENTIN 100 MG PO CAPS: 100.0000 mg | ORAL_CAPSULE | Freq: Every day | ORAL | 1 refills | Status: AC

## 2020-11-29 MED ORDER — APIXABAN 5 MG PO TABS: 5.0000 mg | ORAL_TABLET | Freq: Two times a day (BID) | ORAL | 1 refills | Status: AC

## 2020-11-29 MED ORDER — PANTOPRAZOLE SODIUM 40 MG PO TBEC: 40.0000 mg | DELAYED_RELEASE_TABLET | Freq: Every day | ORAL | 1 refills | Status: AC

## 2020-11-29 MED ORDER — DOCUSATE SODIUM 100 MG PO CAPS: 100.0000 mg | ORAL_CAPSULE | Freq: Two times a day (BID) | ORAL | 6 refills | Status: AC | PRN

## 2020-11-29 MED ORDER — DULOXETINE HCL 30 MG PO CPEP: 30.0000 mg | ORAL_CAPSULE | Freq: Every day | ORAL | 1 refills | Status: AC

## 2020-11-29 MED ORDER — ATORVASTATIN CALCIUM 20 MG PO TABS: 20.0000 mg | ORAL_TABLET | Freq: Every day | ORAL | 1 refills | Status: AC

## 2020-11-29 MED ORDER — EMPAGLIFLOZIN 25 MG PO TABS
25.0000 mg | ORAL_TABLET | Freq: Every day | ORAL | 1 refills | Status: AC
Start: 2020-11-29 — End: ?

## 2020-11-29 MED ORDER — HYDRALAZINE HCL 50 MG PO TABS: 50.0000 mg | ORAL_TABLET | Freq: Three times a day (TID) | ORAL | 1 refills | Status: DC

## 2020-11-29 MED ORDER — METFORMIN HCL 500 MG PO TABS: 500.0000 mg | ORAL_TABLET | Freq: Two times a day (BID) | ORAL | 1 refills | Status: AC

## 2020-11-29 NOTE — Assessment & Plan Note (Signed)
Under good control on current regimen. Continue current regimen. Continue to monitor. Call with any concerns. Refills given. Labs drawn last visit and normal.   

## 2020-11-29 NOTE — Progress Notes (Signed)
BP (!) 116/58   Pulse (!) 56   SpO2 100%    Subjective:    Patient ID: Todd [name], male    DOB: 1930/08/10, 87 y.o.   MRN: [id number]  HPI: Todd [name] is a 87 y.o. male  Chief Complaint  Patient presents with   Diabetes    Patient presents in office today for 3 month follow up, patient is accompanied by his daughters today who report good compliance on medication, she states that she notices that patient is falling asleep more during the day. Patients blood sugars at home have ranged from 168-194.   DIABETES Hypoglycemic episodes:no Polydipsia/polyuria: no Visual disturbance: no Chest pain: no Paresthesias: no Glucose Monitoring: yes  Accucheck frequency: occasionally Taking Insulin?: no Blood Pressure Monitoring: occaisonally Retinal Examination: Up to Date Foot Exam: Up to Date Diabetic Education: Completed Pneumovax: Up to Date Influenza: Up to Date Aspirin: no  HYPERTENSION / [place] Satisfied with current treatment? yes Duration of hypertension: chronic BP monitoring frequency: not checking BP medication side effects: no Duration of hyperlipidemia: chronic Cholesterol medication side effects: no Cholesterol supplements: none Past cholesterol medications: atorvastatin Medication compliance: excellent compliance Aspirin: no Recent stressors: yes Recurrent headaches: no Visual changes: no Palpitations: no Dyspnea: no Chest pain: no Lower extremity edema: no Dizzy/lightheaded: yes  Relevant past medical, surgical, family and social history reviewed and updated as indicated. Interim medical history since our last visit reviewed. Allergies and medications reviewed and updated.  Review of Systems  Constitutional: Negative.   Respiratory: Negative.    Cardiovascular: Negative.   Gastrointestinal: Negative.   Musculoskeletal: Negative.   Neurological:  Positive for speech difficulty and weakness. Negative for dizziness, tremors, seizures, syncope,  facial asymmetry, light-headedness, numbness and headaches.  Psychiatric/Behavioral: Negative.     Per HPI unless specifically indicated above     Objective:    BP (!) 116/58   Pulse (!) 56   SpO2 100%   Wt Readings from Last 3 Encounters:  10/25/20 173 lb (78.5 kg)  08/30/20 173 lb (78.5 kg)  06/10/20 182 lb 15.7 oz (83 kg)    Physical Exam Vitals and nursing note reviewed.  Constitutional:      General: He is not in acute distress.    Appearance: Normal appearance. He is not ill-appearing, toxic-appearing or diaphoretic.     Comments: Wheelchair bound  HENT:     Head: Normocephalic and atraumatic.     Right Ear: External ear normal.     Left Ear: External ear normal.     Nose: Nose normal.     Mouth/Throat:     Mouth: Mucous membranes are moist.     Pharynx: Oropharynx is clear.  Eyes:     General: No scleral icterus.       Right eye: No discharge.        Left eye: No discharge.     Extraocular Movements: Extraocular movements intact.     Conjunctiva/sclera: Conjunctivae normal.     Pupils: Pupils are equal, round, and reactive to light.  Cardiovascular:     Rate and Rhythm: Normal rate and regular rhythm.     Pulses: Normal pulses.     Heart sounds: Normal heart sounds. No murmur heard.   No friction rub. No gallop.  Pulmonary:     Effort: Pulmonary effort is normal. No respiratory distress.     Breath sounds: Normal breath sounds. No stridor. No wheezing, rhonchi or rales.  Chest:     Chest wall: No tenderness.  Musculoskeletal:        General: Normal range of motion.     Cervical back: Normal range of motion and neck supple.  Skin:    General: Skin is warm and dry.     Capillary Refill: Capillary refill takes less than 2 seconds.     Coloration: Skin is not jaundiced or pale.     Findings: No bruising, erythema, lesion or rash.  Neurological:     Mental Status: He is alert. Mental status is at baseline.     Motor: Weakness present.     Coordination:  Coordination abnormal.     Gait: Gait abnormal.  Psychiatric:        Mood and Affect: Mood normal.        Behavior: Behavior normal.        Thought Content: Thought content normal.        Judgment: Judgment normal.    Results for orders placed or performed in visit on 08/30/20  Bayer DCA Hb A1c Waived  Result Value Ref Range   HB A1C (BAYER DCA - WAIVED) 7.5 (H) <7.0 %  CBC with Differential/Platelet  Result Value Ref Range   WBC 6.5 3.4 - 10.8 x10E3/uL   RBC 3.51 (L) 4.14 - 5.80 x10E6/uL   Hemoglobin 10.9 (L) 13.0 - 17.7 g/dL   Hematocrit 32.6 (L) 37.5 - 51.0 %   MCV 93 79 - 97 fL   MCH 31.1 26.6 - 33.0 pg   MCHC 33.4 31.5 - 35.7 g/dL   RDW 11.4 (L) 11.6 - 15.4 %   Platelets 243 150 - 450 x10E3/uL   Neutrophils 64 Not Estab. %   Lymphs 20 Not Estab. %   Monocytes 10 Not Estab. %   Eos 2 Not Estab. %   Basos 1 Not Estab. %   Neutrophils Absolute 4.1 1.4 - 7.0 x10E3/uL   Lymphocytes Absolute 1.3 0.7 - 3.1 x10E3/uL   Monocytes Absolute 0.7 0.1 - 0.9 x10E3/uL   EOS (ABSOLUTE) 0.2 0.0 - 0.4 x10E3/uL   Basophils Absolute 0.1 0.0 - 0.2 x10E3/uL   Immature Granulocytes 3 Not Estab. %   Immature Grans (Abs) 0.2 (H) 0.0 - 0.1 x10E3/uL  Comprehensive metabolic panel  Result Value Ref Range   Glucose 165 (H) 65 - 99 mg/dL   BUN 18 8 - 27 mg/dL   Creatinine, Ser 1.37 (H) 0.76 - 1.27 mg/dL   eGFR 50 (L) >59 mL/min/1.73   BUN/Creatinine Ratio 13 10 - 24   Sodium 141 134 - 144 mmol/L   Potassium 5.2 3.5 - 5.2 mmol/L   Chloride 98 96 - 106 mmol/L   CO2 28 20 - 29 mmol/L   Calcium 9.8 8.6 - 10.2 mg/dL   Total Protein 6.7 6.0 - 8.5 g/dL   Albumin 4.5 3.6 - 4.6 g/dL   Globulin, Total 2.2 1.5 - 4.5 g/dL   Albumin/Globulin Ratio 2.0 1.2 - 2.2   Bilirubin Total 0.4 0.0 - 1.2 mg/dL   Alkaline Phosphatase 90 44 - 121 IU/L   AST 15 0 - 40 IU/L   ALT 10 0 - 44 IU/L  Lipid Panel w/o Chol/HDL Ratio  Result Value Ref Range   Cholesterol, Total 141 100 - 199 mg/dL   Triglycerides 174 (H)  0 - 149 mg/dL   HDL 45 >39 mg/dL   VLDL Cholesterol Cal 29 5 - 40 mg/dL   LDL Chol Calc (NIH) 67 0 - 99 mg/dL  TSH  Result Value Ref Range  TSH 0.843 0.450 - 4.500 uIU/mL  VITAMIN D 25 Hydroxy (Vit-D Deficiency, Fractures)  Result Value Ref Range   Vit D, 25-Hydroxy 26.5 (L) 30.0 - 100.0 ng/mL  B12  Result Value Ref Range   Vitamin B-12 744 232 - 1,245 pg/mL  Urinalysis, Routine w reflex microscopic  Result Value Ref Range   Specific Gravity, UA 1.010 1.005 - 1.030   pH, UA 5.5 5.0 - 7.5   Color, UA Yellow Yellow   Appearance Ur Clear Clear   Leukocytes,UA Negative Negative   Protein,UA Negative Negative/Trace   Glucose, UA 3+ (A) Negative   Ketones, UA Negative Negative   RBC, UA Negative Negative   Bilirubin, UA Negative Negative   Urobilinogen, Ur 0.2 0.2 - 1.0 mg/dL   Nitrite, UA Negative Negative      Assessment & Plan:   Problem List Items Addressed This Visit       Cardiovascular and Mediastinum   Hypertension    Under good control on current regimen. Continue current regimen. Continue to monitor. Call with any concerns. Refills given. Labs drawn last visit and normal.         Relevant Medications   hydrALAZINE (APRESOLINE) 50 MG tablet   apixaban (ELIQUIS) 5 MG TABS tablet   atorvastatin (LIPITOR) 20 MG tablet     Endocrine   Type 2 diabetes mellitus with peripheral neuropathy ([place]) - Primary    Doing great with A1c of 6.1- continue current regimen. Call with any concerns. Continue to monitor.        Relevant Medications   metFORMIN (GLUCOPHAGE) 500 MG tablet   gabapentin (NEURONTIN) 100 MG capsule   empagliflozin (JARDIANCE) 25 MG TABS tablet   DULoxetine (CYMBALTA) 30 MG capsule   atorvastatin (LIPITOR) 20 MG tablet   Other Relevant Orders   Bayer DCA Hb A1c Waived     Other   Mixed hyperlipidemia    Under good control on current regimen. Continue current regimen. Continue to monitor. Call with any concerns. Refills given. Labs drawn last  visit and normal.        Relevant Medications   hydrALAZINE (APRESOLINE) 50 MG tablet   apixaban (ELIQUIS) 5 MG TABS tablet   atorvastatin (LIPITOR) 20 MG tablet     Follow up plan: Return 3-6 months.

## 2020-11-29 NOTE — Assessment & Plan Note (Signed)
Doing great with A1c of 6.1- continue current regimen. Call with any concerns. Continue to monitor.

## 2020-11-30 LAB — BAYER DCA HB A1C WAIVED: HB A1C (BAYER DCA - WAIVED): 6.1 % (ref <7.0)

## 2020-12-03 DIAGNOSIS — B351 Tinea unguium: Secondary | ICD-10-CM | POA: Diagnosis not present

## 2020-12-03 DIAGNOSIS — E1140 Type 2 diabetes mellitus with diabetic neuropathy, unspecified: Secondary | ICD-10-CM | POA: Diagnosis not present

## 2020-12-03 DIAGNOSIS — L03032 Cellulitis of left toe: Secondary | ICD-10-CM | POA: Diagnosis not present

## 2020-12-03 DIAGNOSIS — L600 Ingrowing nail: Secondary | ICD-10-CM | POA: Diagnosis not present

## 2020-12-07 ENCOUNTER — Emergency Department: Payer: HMO

## 2020-12-07 ENCOUNTER — Encounter: Payer: Self-pay | Admitting: Internal Medicine

## 2020-12-07 ENCOUNTER — Other Ambulatory Visit: Payer: Self-pay

## 2020-12-07 ENCOUNTER — Inpatient Hospital Stay
Admission: EM | Admit: 2020-12-07 | Discharge: 2020-12-16 | DRG: 308 | Disposition: A | Discharge status: Skilled Nursing Facility | Payer: HMO | Source: Ambulatory Visit | Attending: Internal Medicine | Admitting: Internal Medicine

## 2020-12-07 ENCOUNTER — Ambulatory Visit
Admission: EM | Admit: 2020-12-07 | Discharge: 2020-12-07 | Disposition: A | Discharge status: Home / Self Care | Payer: HMO | Source: Home / Self Care

## 2020-12-07 DIAGNOSIS — E876 Hypokalemia: Secondary | ICD-10-CM | POA: Diagnosis present

## 2020-12-07 DIAGNOSIS — I4892 Unspecified atrial flutter: Secondary | ICD-10-CM | POA: Diagnosis present

## 2020-12-07 DIAGNOSIS — E538 Deficiency of other specified B group vitamins: Secondary | ICD-10-CM | POA: Diagnosis present

## 2020-12-07 DIAGNOSIS — R0602 Shortness of breath: Secondary | ICD-10-CM

## 2020-12-07 DIAGNOSIS — J189 Pneumonia, unspecified organism: Secondary | ICD-10-CM | POA: Diagnosis present

## 2020-12-07 DIAGNOSIS — R5383 Other fatigue: Secondary | ICD-10-CM

## 2020-12-07 DIAGNOSIS — N200 Calculus of kidney: Secondary | ICD-10-CM | POA: Diagnosis not present

## 2020-12-07 DIAGNOSIS — E1169 Type 2 diabetes mellitus with other specified complication: Secondary | ICD-10-CM | POA: Diagnosis present

## 2020-12-07 DIAGNOSIS — Z20822 Contact with and (suspected) exposure to covid-19: Secondary | ICD-10-CM | POA: Diagnosis present

## 2020-12-07 DIAGNOSIS — F339 Major depressive disorder, recurrent, unspecified: Secondary | ICD-10-CM | POA: Diagnosis not present

## 2020-12-07 DIAGNOSIS — I2510 Atherosclerotic heart disease of native coronary artery without angina pectoris: Secondary | ICD-10-CM | POA: Diagnosis not present

## 2020-12-07 DIAGNOSIS — Z1159 Encounter for screening for other viral diseases: Secondary | ICD-10-CM | POA: Diagnosis not present

## 2020-12-07 DIAGNOSIS — R531 Weakness: Secondary | ICD-10-CM | POA: Diagnosis not present

## 2020-12-07 DIAGNOSIS — N189 Chronic kidney disease, unspecified: Secondary | ICD-10-CM | POA: Diagnosis not present

## 2020-12-07 DIAGNOSIS — I69351 Hemiplegia and hemiparesis following cerebral infarction affecting right dominant side: Secondary | ICD-10-CM

## 2020-12-07 DIAGNOSIS — E1122 Type 2 diabetes mellitus with diabetic chronic kidney disease: Secondary | ICD-10-CM | POA: Diagnosis present

## 2020-12-07 DIAGNOSIS — E119 Type 2 diabetes mellitus without complications: Secondary | ICD-10-CM | POA: Diagnosis not present

## 2020-12-07 DIAGNOSIS — E1142 Type 2 diabetes mellitus with diabetic polyneuropathy: Secondary | ICD-10-CM | POA: Diagnosis present

## 2020-12-07 DIAGNOSIS — N1831 Chronic kidney disease, stage 3a: Secondary | ICD-10-CM | POA: Diagnosis present

## 2020-12-07 DIAGNOSIS — I130 Hypertensive heart and chronic kidney disease with heart failure and stage 1 through stage 4 chronic kidney disease, or unspecified chronic kidney disease: Secondary | ICD-10-CM | POA: Diagnosis present

## 2020-12-07 DIAGNOSIS — N179 Acute kidney failure, unspecified: Secondary | ICD-10-CM | POA: Diagnosis present

## 2020-12-07 DIAGNOSIS — I959 Hypotension, unspecified: Secondary | ICD-10-CM | POA: Diagnosis present

## 2020-12-07 DIAGNOSIS — J9621 Acute and chronic respiratory failure with hypoxia: Secondary | ICD-10-CM | POA: Diagnosis present

## 2020-12-07 DIAGNOSIS — F322 Major depressive disorder, single episode, severe without psychotic features: Secondary | ICD-10-CM | POA: Diagnosis present

## 2020-12-07 DIAGNOSIS — M5136 Other intervertebral disc degeneration, lumbar region: Secondary | ICD-10-CM | POA: Diagnosis not present

## 2020-12-07 DIAGNOSIS — Z7901 Long term (current) use of anticoagulants: Secondary | ICD-10-CM

## 2020-12-07 DIAGNOSIS — K219 Gastro-esophageal reflux disease without esophagitis: Secondary | ICD-10-CM | POA: Diagnosis present

## 2020-12-07 DIAGNOSIS — R630 Anorexia: Secondary | ICD-10-CM | POA: Diagnosis not present

## 2020-12-07 DIAGNOSIS — R1312 Dysphagia, oropharyngeal phase: Secondary | ICD-10-CM | POA: Diagnosis not present

## 2020-12-07 DIAGNOSIS — F419 Anxiety disorder, unspecified: Secondary | ICD-10-CM | POA: Diagnosis present

## 2020-12-07 DIAGNOSIS — Z8679 Personal history of other diseases of the circulatory system: Secondary | ICD-10-CM | POA: Diagnosis not present

## 2020-12-07 DIAGNOSIS — G20 Parkinson's disease: Secondary | ICD-10-CM | POA: Diagnosis present

## 2020-12-07 DIAGNOSIS — I69320 Aphasia following cerebral infarction: Secondary | ICD-10-CM | POA: Diagnosis not present

## 2020-12-07 DIAGNOSIS — Z7982 Long term (current) use of aspirin: Secondary | ICD-10-CM

## 2020-12-07 DIAGNOSIS — F0150 Vascular dementia without behavioral disturbance: Secondary | ICD-10-CM | POA: Diagnosis present

## 2020-12-07 DIAGNOSIS — I351 Nonrheumatic aortic (valve) insufficiency: Secondary | ICD-10-CM | POA: Diagnosis present

## 2020-12-07 DIAGNOSIS — D638 Anemia in other chronic diseases classified elsewhere: Secondary | ICD-10-CM | POA: Diagnosis not present

## 2020-12-07 DIAGNOSIS — R4182 Altered mental status, unspecified: Secondary | ICD-10-CM | POA: Diagnosis not present

## 2020-12-07 DIAGNOSIS — A419 Sepsis, unspecified organism: Secondary | ICD-10-CM | POA: Diagnosis not present

## 2020-12-07 DIAGNOSIS — D631 Anemia in chronic kidney disease: Secondary | ICD-10-CM | POA: Diagnosis present

## 2020-12-07 DIAGNOSIS — G8190 Hemiplegia, unspecified affecting unspecified side: Secondary | ICD-10-CM | POA: Diagnosis not present

## 2020-12-07 DIAGNOSIS — I25118 Atherosclerotic heart disease of native coronary artery with other forms of angina pectoris: Secondary | ICD-10-CM | POA: Diagnosis not present

## 2020-12-07 DIAGNOSIS — K5900 Constipation, unspecified: Secondary | ICD-10-CM | POA: Diagnosis present

## 2020-12-07 DIAGNOSIS — N183 Chronic kidney disease, stage 3 unspecified: Secondary | ICD-10-CM | POA: Diagnosis present

## 2020-12-07 DIAGNOSIS — Z8546 Personal history of malignant neoplasm of prostate: Secondary | ICD-10-CM

## 2020-12-07 DIAGNOSIS — G9341 Metabolic encephalopathy: Secondary | ICD-10-CM | POA: Diagnosis present

## 2020-12-07 DIAGNOSIS — I4891 Unspecified atrial fibrillation: Secondary | ICD-10-CM | POA: Diagnosis present

## 2020-12-07 DIAGNOSIS — R062 Wheezing: Secondary | ICD-10-CM | POA: Diagnosis not present

## 2020-12-07 DIAGNOSIS — R4701 Aphasia: Secondary | ICD-10-CM | POA: Diagnosis not present

## 2020-12-07 DIAGNOSIS — I69391 Dysphagia following cerebral infarction: Secondary | ICD-10-CM | POA: Diagnosis not present

## 2020-12-07 DIAGNOSIS — R9431 Abnormal electrocardiogram [ECG] [EKG]: Secondary | ICD-10-CM | POA: Diagnosis not present

## 2020-12-07 DIAGNOSIS — I517 Cardiomegaly: Secondary | ICD-10-CM | POA: Diagnosis not present

## 2020-12-07 DIAGNOSIS — E872 Acidosis: Secondary | ICD-10-CM | POA: Diagnosis present

## 2020-12-07 DIAGNOSIS — M419 Scoliosis, unspecified: Secondary | ICD-10-CM | POA: Diagnosis not present

## 2020-12-07 DIAGNOSIS — I5022 Chronic systolic (congestive) heart failure: Secondary | ICD-10-CM | POA: Diagnosis present

## 2020-12-07 DIAGNOSIS — R778 Other specified abnormalities of plasma proteins: Secondary | ICD-10-CM | POA: Diagnosis not present

## 2020-12-07 DIAGNOSIS — R627 Adult failure to thrive: Secondary | ICD-10-CM | POA: Diagnosis present

## 2020-12-07 DIAGNOSIS — E639 Nutritional deficiency, unspecified: Secondary | ICD-10-CM | POA: Diagnosis not present

## 2020-12-07 DIAGNOSIS — I10 Essential (primary) hypertension: Secondary | ICD-10-CM | POA: Diagnosis present

## 2020-12-07 DIAGNOSIS — R0789 Other chest pain: Secondary | ICD-10-CM | POA: Diagnosis not present

## 2020-12-07 DIAGNOSIS — E782 Mixed hyperlipidemia: Secondary | ICD-10-CM | POA: Diagnosis present

## 2020-12-07 DIAGNOSIS — I483 Typical atrial flutter: Secondary | ICD-10-CM | POA: Diagnosis not present

## 2020-12-07 DIAGNOSIS — F32A Depression, unspecified: Secondary | ICD-10-CM | POA: Diagnosis not present

## 2020-12-07 DIAGNOSIS — I272 Pulmonary hypertension, unspecified: Secondary | ICD-10-CM | POA: Diagnosis present

## 2020-12-07 DIAGNOSIS — E785 Hyperlipidemia, unspecified: Secondary | ICD-10-CM

## 2020-12-07 DIAGNOSIS — Z96653 Presence of artificial knee joint, bilateral: Secondary | ICD-10-CM | POA: Diagnosis present

## 2020-12-07 DIAGNOSIS — D509 Iron deficiency anemia, unspecified: Secondary | ICD-10-CM | POA: Diagnosis present

## 2020-12-07 DIAGNOSIS — Z743 Need for continuous supervision: Secondary | ICD-10-CM | POA: Diagnosis not present

## 2020-12-07 DIAGNOSIS — K8020 Calculus of gallbladder without cholecystitis without obstruction: Secondary | ICD-10-CM | POA: Diagnosis not present

## 2020-12-07 DIAGNOSIS — I499 Cardiac arrhythmia, unspecified: Secondary | ICD-10-CM | POA: Diagnosis not present

## 2020-12-07 DIAGNOSIS — I6930 Unspecified sequelae of cerebral infarction: Secondary | ICD-10-CM | POA: Diagnosis not present

## 2020-12-07 DIAGNOSIS — Z8249 Family history of ischemic heart disease and other diseases of the circulatory system: Secondary | ICD-10-CM

## 2020-12-07 DIAGNOSIS — H409 Unspecified glaucoma: Secondary | ICD-10-CM | POA: Diagnosis not present

## 2020-12-07 DIAGNOSIS — I639 Cerebral infarction, unspecified: Secondary | ICD-10-CM

## 2020-12-07 DIAGNOSIS — R079 Chest pain, unspecified: Secondary | ICD-10-CM | POA: Diagnosis not present

## 2020-12-07 DIAGNOSIS — I480 Paroxysmal atrial fibrillation: Principal | ICD-10-CM

## 2020-12-07 DIAGNOSIS — I248 Other forms of acute ischemic heart disease: Secondary | ICD-10-CM | POA: Diagnosis present

## 2020-12-07 DIAGNOSIS — Z79899 Other long term (current) drug therapy: Secondary | ICD-10-CM

## 2020-12-07 DIAGNOSIS — I69322 Dysarthria following cerebral infarction: Secondary | ICD-10-CM | POA: Diagnosis not present

## 2020-12-07 DIAGNOSIS — R4781 Slurred speech: Secondary | ICD-10-CM | POA: Diagnosis not present

## 2020-12-07 DIAGNOSIS — C61 Malignant neoplasm of prostate: Secondary | ICD-10-CM | POA: Diagnosis not present

## 2020-12-07 DIAGNOSIS — R279 Unspecified lack of coordination: Secondary | ICD-10-CM | POA: Diagnosis not present

## 2020-12-07 DIAGNOSIS — N401 Enlarged prostate with lower urinary tract symptoms: Secondary | ICD-10-CM | POA: Diagnosis not present

## 2020-12-07 DIAGNOSIS — I509 Heart failure, unspecified: Secondary | ICD-10-CM | POA: Diagnosis not present

## 2020-12-07 DIAGNOSIS — D649 Anemia, unspecified: Secondary | ICD-10-CM | POA: Diagnosis present

## 2020-12-07 DIAGNOSIS — N400 Enlarged prostate without lower urinary tract symptoms: Secondary | ICD-10-CM | POA: Diagnosis present

## 2020-12-07 DIAGNOSIS — I4819 Other persistent atrial fibrillation: Secondary | ICD-10-CM | POA: Diagnosis not present

## 2020-12-07 DIAGNOSIS — Z87891 Personal history of nicotine dependence: Secondary | ICD-10-CM

## 2020-12-07 DIAGNOSIS — I700 Atherosclerosis of aorta: Secondary | ICD-10-CM | POA: Diagnosis not present

## 2020-12-07 DIAGNOSIS — Z7984 Long term (current) use of oral hypoglycemic drugs: Secondary | ICD-10-CM

## 2020-12-07 DIAGNOSIS — R404 Transient alteration of awareness: Secondary | ICD-10-CM | POA: Diagnosis not present

## 2020-12-07 LAB — TROPONIN I (HIGH SENSITIVITY)
Troponin I (High Sensitivity): 95 ng/L — ABNORMAL HIGH (ref <18)
Troponin I (High Sensitivity): 96 ng/L — ABNORMAL HIGH (ref <18)

## 2020-12-07 LAB — COMPREHENSIVE METABOLIC PANEL
ALT: <5 U/L (ref 0–44)
AST: 20 U/L (ref 15–41)
Albumin: 3.7 g/dL (ref 3.5–5.0)
Alkaline Phosphatase: 52 U/L (ref 38–126)
Anion gap: 7 (ref 5–15)
BUN: 24 mg/dL — ABNORMAL HIGH (ref 8–23)
CO2: 29 mmol/L (ref 22–32)
Calcium: 8.5 mg/dL — ABNORMAL LOW (ref 8.9–10.3)
Chloride: 103 mmol/L (ref 98–111)
Creatinine, Ser: 1.4 mg/dL — ABNORMAL HIGH (ref 0.61–1.24)
GFR, Estimated: 48 mL/min — ABNORMAL LOW (ref >60)
Glucose, Bld: 202 mg/dL — ABNORMAL HIGH (ref 70–99)
Potassium: 4.1 mmol/L (ref 3.5–5.1)
Sodium: 139 mmol/L (ref 135–145)
Total Bilirubin: 0.6 mg/dL (ref 0.3–1.2)
Total Protein: 6.7 g/dL (ref 6.5–8.1)

## 2020-12-07 LAB — RESP PANEL BY RT-PCR (FLU A&B, COVID) ARPGX2
Influenza A by PCR: NEGATIVE
Influenza B by PCR: NEGATIVE
SARS Coronavirus 2 by RT PCR: NEGATIVE

## 2020-12-07 LAB — CBC WITH DIFFERENTIAL/PLATELET
Abs Immature Granulocytes: 0.06 10*3/uL (ref 0.00–0.07)
Basophils Absolute: 0.1 10*3/uL (ref 0.0–0.1)
Basophils Relative: 1 %
Eosinophils Absolute: 0.1 10*3/uL (ref 0.0–0.5)
Eosinophils Relative: 2 %
HCT: 31.3 % — ABNORMAL LOW (ref 39.0–52.0)
Hemoglobin: 9.9 g/dL — ABNORMAL LOW (ref 13.0–17.0)
Immature Granulocytes: 1 %
Lymphocytes Relative: 20 %
Lymphs Abs: 1.4 10*3/uL (ref 0.7–4.0)
MCH: 31.3 pg (ref 26.0–34.0)
MCHC: 31.6 g/dL (ref 30.0–36.0)
MCV: 99.1 fL (ref 80.0–100.0)
Monocytes Absolute: 1 10*3/uL (ref 0.1–1.0)
Monocytes Relative: 15 %
Neutro Abs: 4.4 10*3/uL (ref 1.7–7.7)
Neutrophils Relative %: 61 %
Platelets: 199 10*3/uL (ref 150–400)
RBC: 3.16 MIL/uL — ABNORMAL LOW (ref 4.22–5.81)
RDW: 12.8 % (ref 11.5–15.5)
WBC: 7.1 10*3/uL (ref 4.0–10.5)
nRBC: 0 % (ref 0.0–0.2)

## 2020-12-07 LAB — LACTIC ACID, PLASMA
Lactic Acid, Venous: 2 mmol/L (ref 0.5–1.9)
Lactic Acid, Venous: 1.5 mmol/L (ref 0.5–1.9)

## 2020-12-07 LAB — GLUCOSE, CAPILLARY: Glucose-Capillary: 205 mg/dL — ABNORMAL HIGH (ref 70–99)

## 2020-12-07 LAB — DIGOXIN LEVEL: Digoxin Level: 0.2 ng/mL — ABNORMAL LOW (ref 0.8–2.0)

## 2020-12-07 MED ORDER — PHENYLEPHRINE HCL-NACL 10-0.9 MG/250ML-% IV SOLN
25.0000 ug/min | INTRAVENOUS | Status: DC
  Administered 2020-12-07: 25 ug/min via INTRAVENOUS
  Filled 2020-12-07: qty 250

## 2020-12-07 MED ORDER — SODIUM CHLORIDE 0.9 % IV BOLUS
1000.0000 mL | Freq: Once | INTRAVENOUS | Status: AC
  Administered 2020-12-07: 1000 mL via INTRAVENOUS

## 2020-12-07 MED ORDER — DILTIAZEM HCL 25 MG/5ML IV SOLN
10.0000 mg | Freq: Once | INTRAVENOUS | Status: AC
  Administered 2020-12-07: 10 mg via INTRAVENOUS
  Filled 2020-12-07: qty 5

## 2020-12-07 MED ORDER — PHENYLEPHRINE HCL-NACL 10-0.9 MG/250ML-% IV SOLN: 0.0000 ug/min | INTRAVENOUS | Status: DC

## 2020-12-07 MED ORDER — APIXABAN 5 MG PO TABS
5.0000 mg | ORAL_TABLET | Freq: Two times a day (BID) | ORAL | Status: DC
  Administered 2020-12-08 – 2020-12-16 (×18): 5 mg via ORAL
  Filled 2020-12-07 (×18): qty 1

## 2020-12-07 MED ORDER — SODIUM CHLORIDE 0.9 % IV BOLUS
500.0000 mL | Freq: Once | INTRAVENOUS | Status: AC
  Administered 2020-12-07: 500 mL via INTRAVENOUS

## 2020-12-07 MED ORDER — DILTIAZEM HCL-DEXTROSE 125-5 MG/125ML-% IV SOLN (PREMIX): 5.0000 mg/h | INTRAVENOUS | Status: DC

## 2020-12-07 MED ORDER — DILTIAZEM HCL-DEXTROSE 125-5 MG/125ML-% IV SOLN (PREMIX)
5.0000 mg/h | INTRAVENOUS | Status: DC
Start: 2020-12-07 — End: 2020-12-08
  Filled 2020-12-07: qty 125

## 2020-12-07 MED ORDER — VANCOMYCIN HCL 1750 MG/350ML IV SOLN
1750.0000 mg | Freq: Once | INTRAVENOUS | Status: DC
  Filled 2020-12-07: qty 350

## 2020-12-07 MED ORDER — SODIUM CHLORIDE 0.9 % IV SOLN
250.0000 mL | INTRAVENOUS | Status: DC
  Administered 2020-12-09: 250 mL via INTRAVENOUS

## 2020-12-07 MED ORDER — ONDANSETRON HCL 4 MG PO TABS: 4.0000 mg | ORAL_TABLET | Freq: Four times a day (QID) | ORAL | Status: DC | PRN

## 2020-12-07 MED ORDER — SODIUM CHLORIDE 0.9% FLUSH
3.0000 mL | Freq: Two times a day (BID) | INTRAVENOUS | Status: DC
  Administered 2020-12-08 – 2020-12-16 (×17): 3 mL via INTRAVENOUS

## 2020-12-07 MED ORDER — ONDANSETRON HCL 4 MG/2ML IJ SOLN: 4.0000 mg | Freq: Four times a day (QID) | INTRAMUSCULAR | Status: DC | PRN

## 2020-12-07 MED ORDER — ACETAMINOPHEN 325 MG PO TABS: 650.0000 mg | ORAL_TABLET | Freq: Four times a day (QID) | ORAL | Status: DC | PRN

## 2020-12-07 MED ORDER — INSULIN ASPART 100 UNIT/ML IJ SOLN
0.0000 [IU] | Freq: Three times a day (TID) | INTRAMUSCULAR | Status: DC
  Administered 2020-12-08 (×2): 2 [IU] via SUBCUTANEOUS
  Administered 2020-12-08: 1 [IU] via SUBCUTANEOUS
  Administered 2020-12-09 (×2): 3 [IU] via SUBCUTANEOUS
  Administered 2020-12-09 – 2020-12-11 (×4): 2 [IU] via SUBCUTANEOUS
  Administered 2020-12-11 – 2020-12-12 (×2): 1 [IU] via SUBCUTANEOUS
  Administered 2020-12-12 (×2): 2 [IU] via SUBCUTANEOUS
  Administered 2020-12-13: 3 [IU] via SUBCUTANEOUS
  Administered 2020-12-13 – 2020-12-14 (×3): 2 [IU] via SUBCUTANEOUS
  Administered 2020-12-14 (×2): 1 [IU] via SUBCUTANEOUS
  Administered 2020-12-15: 2 [IU] via SUBCUTANEOUS
  Administered 2020-12-15: 3 [IU] via SUBCUTANEOUS
  Administered 2020-12-15 – 2020-12-16 (×2): 2 [IU] via SUBCUTANEOUS
  Filled 2020-12-07 (×23): qty 1

## 2020-12-07 MED ORDER — ACETAMINOPHEN 650 MG RE SUPP: 650.0000 mg | Freq: Four times a day (QID) | RECTAL | Status: DC | PRN

## 2020-12-07 MED ORDER — DILTIAZEM HCL 25 MG/5ML IV SOLN: 10.0000 mg | Freq: Once | INTRAVENOUS | Status: DC

## 2020-12-07 MED ORDER — SODIUM CHLORIDE 0.9 % IV SOLN: 2.0000 g | Freq: Once | INTRAVENOUS | Status: DC

## 2020-12-07 NOTE — Discharge Instructions (Addendum)

## 2020-12-07 NOTE — ED Notes (Signed)
Patient is being discharged from the Urgent Care and sent to the Emergency Department via EMS. Per Jerene Pitch PA, patient is in need of higher level of care due to A fib RVR rate of 155. Patient is aware and verbalizes understanding of plan of care.  Vitals:   12/07/20 1552 12/07/20 1603  BP: 92/76   Pulse: (!) 52 (!) 155  Resp: 12   Temp: 97.6 F (36.4 C)   SpO2: 100%

## 2020-12-07 NOTE — ED Triage Notes (Signed)
Pt BIB EMS coming from urgent care. Family brought in pt at [facility] c/o poor appetite for few days. Pt was found in Afib RVR with 100-160 rate. H/O Afib, CVA, aphasia.

## 2020-12-07 NOTE — ED Triage Notes (Signed)
Pt presents with complaints of fatigue, weakness, and poor appetite for the last few days. Reports he recently had a check up with a good report. Pt cannot speak due to previous strokes. Daughter is at bedside.

## 2020-12-07 NOTE — ED Notes (Signed)
Pt's daughter at the bedside. Per daughter, hasn't been eating well for few days. Per daughter, pt takes blood thinner for Afib. Pt denies pain to daughter, no chest pain/ no SOB, urinates okay.

## 2020-12-07 NOTE — ED Provider Notes (Signed)
[facility]    CSN: [id number] Arrival date & time: 12/07/20  1539      History   Chief Complaint Chief Complaint  Patient presents with   Weakness    HPI Todd [name] is a 87 y.o. male presenting with his daughter for 2 to 3-day history of increased weakness, fatigue, loss of appetite.  Patient does have significant medical history for CVA and has deficits due to the CVA.  He has no ability to speak and has difficulty communicating.  Patient's daughter states that he does not understand everything that is asked of him either.  He also has history of aortic regurgitation, CAD, diastolic cardiac dysfunction, hypertension, hyperlipidemia, atrial fibrillation.  Patient's daughter states that his heart is normally in a regular rhythm.  He does take Eliquis.  Patient also takes digoxin, hydralazine, furosemide.  Additionally, patient is taking Sinemet IR for parkinsonism.  He is taking metformin and Jardiance for type 2 diabetes as well.  Daughter denies any breathing difficulty but he does use supplemental oxygen.  Patient has apparently indicated that his left arm is bothering him.  Daughter states that he has pointed to the arm and she believes that he might be having pain. Daughter denies fever, cough, congestion. No other complaints.  HPI  Past Medical History:  Diagnosis Date   Anxiety    Aortic regurgitation    a. 03/2017 Echo: Mod AI; b. 05/2017 Echo: Triv AI.   Arthritis    osteoarthritis-left knee   CAD (coronary artery disease)    a. Reported h/o cath ~ 2008 - ? small vessel dzs. No PCI performed; b. 03/2017 MV: EF 45-54%, no ischemia/infarct.   Depression    Diabetes mellitus without complication ([place])    Diastolic dysfunction    a. 03/2017 Echo: EF 55-65%; b. 05/2017 Echo: EF 55-60%, no rwma, Gr1 DD. Triv AI.  Mildly dil LA.   ED (erectile dysfunction)    Elevated troponin    a. chronic mild elevation - 0.03->0.04.   GERD (gastroesophageal reflux disease)     Glaucoma    Hyperlipidemia    Hypertension    Iron deficiency anemia due to chronic blood loss    Left pontine stroke w/ cerebrovascular disease(HCC)    a. 05/2017 MRI/A: acute to subacute L pontine infarct. Chronic left thalamic lacunar infarct. Severe basilar artery stenosis w/ radiographic string sign of the distal 1/3. Mild to moderate L PCA P2 segment stenosis; b. 05/2017 Carotid U/S: <50% bilat ICA stenosis.   Lumbago    Lumbosacral Neuritis   Prostate cancer [facility])     Patient Active Problem List   Diagnosis Date Noted   Lower urinary tract symptoms (LUTS) 05/19/2020   Atrial fibrillation with rapid ventricular response ([place]) 11/11/2018   Frequent PVCs    Anemia due to stage 3 chronic kidney disease ([place]) 09/16/2018   Normocytic anemia 05/17/2018   Hemiplegia ([place]) 04/25/2018   B12 deficiency 04/25/2018   Dysphagia as late effect of cerebrovascular accident (CVA) 04/25/2018   Depression, major, single episode, severe ([place]) 04/25/2018   CHF (congestive heart failure) ([place]) 01/09/2018   Advanced care planning/counseling discussion 12/20/2017   Hypoxia 11/02/2017   Mixed hyperlipidemia 09/21/2017   Aphasia 05/18/2017   CAD (coronary artery disease) 05/18/2017   History of stroke with residual deficit 05/18/2017   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 11/09/2015   Knee joint replaced by other means 07/04/2015   Urinary incontinence 04/27/2015   Anxiety, generalized 04/07/2015   History of BCG  vaccination 04/07/2015   GERD (gastroesophageal reflux disease) 03/04/2015   Degeneration of intervertebral disc of lumbar region 02/23/2015   Neuritis or radiculitis due to rupture of lumbar intervertebral disc 02/23/2015   Lumbar stenosis with neurogenic claudication 02/23/2015   Hypertension 12/02/2014   Type 2 diabetes mellitus with peripheral neuropathy ([place]) 12/02/2014   Benign essential tremor 03/13/2014   Imbalance 03/13/2014   Prostate cancer ([place]) 10/07/2012   ED (erectile  dysfunction) of organic origin 02/09/2012    Past Surgical History:  Procedure Laterality Date   CARPAL TUNNEL RELEASE     REPLACEMENT TOTAL KNEE BILATERAL         Home Medications    Prior to Admission medications   Medication Sig Start Date End Date Taking? Authorizing Provider  acetaminophen (TYLENOL) 650 MG CR tablet Take 650 mg by mouth every 8 (eight) hours as needed for pain.    [provider]  apixaban (ELIQUIS) 5 MG TABS tablet Take 1 tablet (5 mg total) by mouth 2 (two) times daily. 11/29/20   Johnson, Megan P, DO  atorvastatin (LIPITOR) 20 MG tablet Take 1 tablet (20 mg total) by mouth daily. 11/29/20   Park Liter P, DO  blood glucose meter kit and supplies KIT Dispense based on patient and insurance preference. Use up to four times daily as directed. (FOR ICD-9 250.00, 250.01). 11/17/18   Dustin Flock, MD  Blood Glucose Monitoring Suppl (ACCU-CHEK AVIVA PLUS) w/Device KIT Use to check sugar levels x 2 daily 11/17/18   Dustin Flock, MD  carbidopa-levodopa (SINEMET IR) 25-100 MG tablet Take 1 tablet by mouth 3 (three) times daily. 09/22/20   [provider]  digoxin (LANOXIN) 0.125 MG tablet Take 1 tablet (0.125 mg total) by mouth every other day. 11/05/20   Minna Merritts, MD  docusate sodium (COLACE) 100 MG capsule Take 1 capsule (100 mg total) by mouth 2 (two) times daily as needed for mild constipation. 11/29/20   Johnson, Megan P, DO  DULoxetine (CYMBALTA) 30 MG capsule Take 1 capsule (30 mg total) by mouth daily. 11/29/20   Park Liter P, DO  empagliflozin (JARDIANCE) 25 MG TABS tablet Take 1 tablet (25 mg total) by mouth daily before breakfast. 11/29/20   Park Liter P, DO  furosemide (LASIX) 20 MG tablet Take 1.5 tablets (30 mg) by mouth once daily 07/12/20   Marrianne Mood D, PA-C  gabapentin (NEURONTIN) 100 MG capsule Take 1 capsule (100 mg total) by mouth at bedtime. 11/29/20   Park Liter P, DO  hydrALAZINE (APRESOLINE) 50 MG tablet  Take 1 tablet (50 mg total) by mouth 3 (three) times daily. 11/29/20   Park Liter P, DO  Lancets (ACCU-CHEK SOFT TOUCH) lancets Use as instructed 11/17/18   Dustin Flock, MD  latanoprost (XALATAN) 0.005 % ophthalmic solution Place 1 drop into both eyes at bedtime.  06/08/16   [provider]  Magnesium Oxide 400 (240 Mg) MG TABS Take 1 tablet (400 mg total) by mouth 2 (two) times daily. 07/01/19   Dunn, Areta Haber, PA-C  metFORMIN (GLUCOPHAGE) 500 MG tablet Take 1 tablet (500 mg total) by mouth 2 (two) times daily with a meal. 11/29/20   Johnson, Megan P, DO  naproxen sodium (ALEVE) 220 MG tablet Take 220 mg by mouth 2 (two) times daily as needed.     [provider]  ONETOUCH ULTRA test strip USE TO CHECK SUGAR LEVELS TWICE DAILY. 11/18/18   Guadalupe Maple, MD  pantoprazole (PROTONIX) 40 MG tablet  Take 1 tablet (40 mg total) by mouth daily. 11/29/20   Johnson, Megan P, DO  Polyethylene Glycol 3350 (MIRALAX PO) Take by mouth as needed.    [provider]  propranolol ER (INDERAL LA) 120 MG 24 hr capsule Take 1 capsule (120 mg total) by mouth daily. 03/22/20   Minna Merritts, MD  tamsulosin (FLOMAX) 0.4 MG CAPS capsule Take 1 capsule (0.4 mg total) by mouth daily. 05/31/20   Valerie Roys, DO    Family History Family History  Problem Relation Age of Onset   Stroke Mother    Hypertension Mother    Hypertension Father    Stroke Sister    Prostate cancer Neg Hx    Kidney cancer Neg Hx    Bladder Cancer Neg Hx     Social History Social History   Tobacco Use   Smoking status: Former    Pack years: 0.00    Types: Cigarettes    Quit date: 12/02/1994    Years since quitting: 26.0   Smokeless tobacco: Never  Vaping Use   Vaping Use: Never used  Substance Use Topics   Alcohol use: Yes    Alcohol/week: 1.0 standard drink    Types: 1 Cans of beer per week    Comment: occasional   Drug use: No     Allergies   Amlodipine, Fluoxetine, and  Meloxicam   Review of Systems Review of Systems  Constitutional:  Positive for appetite change and fatigue. Negative for fever.  HENT:  Negative for congestion.   Respiratory:  Negative for cough and wheezing.   Cardiovascular:  Negative for leg swelling.  Gastrointestinal:  Negative for diarrhea and vomiting.  Genitourinary:  Negative for decreased urine volume.  Neurological:  Positive for weakness. Negative for syncope.    Physical Exam Triage Vital Signs ED Triage Vitals [12/07/20 1552]  Enc Vitals Group     BP 92/76     Pulse Rate (!) 52     Resp 12     Temp 97.6 F (36.4 C)     Temp src      SpO2 100 %     Weight      Height      Head Circumference      Peak Flow      Pain Score      Pain Loc      Pain Edu?      Excl. in [place]?    No data found.  Updated Vital Signs BP 92/76   Pulse (!) 155   Temp 97.6 F (36.4 C)   Resp 12   SpO2 100%      Physical Exam Vitals and nursing note reviewed.  Constitutional:      General: He is not in acute distress.    Appearance: Normal appearance. He is well-developed. He is not ill-appearing.  HENT:     Head: Normocephalic and atraumatic.  Eyes:     General: No scleral icterus.    Extraocular Movements: Extraocular movements intact.     Conjunctiva/sclera: Conjunctivae normal.     Pupils: Pupils are equal, round, and reactive to light.  Cardiovascular:     Rate and Rhythm: Tachycardia present. Rhythm irregular.  Pulmonary:     Effort: Pulmonary effort is normal. No respiratory distress.     Breath sounds: Normal breath sounds.  Musculoskeletal:     Cervical back: Neck supple.     Right lower leg: No edema.     Left lower  leg: No edema.  Skin:    General: Skin is warm and dry.  Neurological:     Mental Status: He is alert. Mental status is at baseline.     Motor: Weakness present.     Gait: Gait abnormal (in wheelchair).     Comments: Appears to be at baseline  Psychiatric:        Attention and Perception:  He is inattentive.        Speech: He is noncommunicative.     UC Treatments / Results  Labs (all labs ordered are listed, but only abnormal results are displayed) Labs Reviewed  GLUCOSE, CAPILLARY - Abnormal; Notable for the following components:      Result Value   Glucose-Capillary 205 (*)    All other components within normal limits  CBG MONITORING, ED    EKG   Radiology No results found.  Procedures ED EKG  Date/Time: 12/07/2020 4:21 PM Performed by: Danton Clap, PA-C Authorized by: Coral Spikes, DO   Previous ECG:    Previous ECG:  Compared to current   Similarity:  Changes noted   Comparison ECG info:  Sinus rhythm w/ sinus rhythm and 1st degree AV block in Oct 2021 Interpretation:    Interpretation: abnormal     Details:  Atrial fibrillation with RVR Rate:    ECG rate:  139   ECG rate assessment: tachycardic   Rhythm:    Rhythm: atrial fibrillation   QRS:    QRS axis:  Left   QRS intervals:  Normal   QRS conduction: normal   ST segments:    ST segments:  Normal T waves:    T waves: normal   Comments:     Atrial fibrillation with RVR (including critical care time)  Medications Ordered in UC Medications - No data to display  Initial Impression / Assessment and Plan / UC Course  I have reviewed the triage vital signs and the nursing notes.  Pertinent labs & imaging results that were available during my care of the patient were reviewed by me and considered in my medical decision making (see chart for details).  87 year old male brought in by daughter for concerns about weakness, fatigue and loss of appetite over the past 2 to 3 days.  Patient has a significant medical history for aortic regurgitation, CAD, diastolic cardiac dysfunction, hypertension, hyperlipidemia, atrial fibrillation, type 2 diabetes and parkinsonism.  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  Pulse rate in office is 155 bpm.  BP is 92/76.  He is afebrile.  Patient does appear  lethargic and cannot answer questions.  Tachycardic and irregularly irregular rhythm.  Chest is clear to auscultation.   EKG performed today shows heart rate of 139 bpm with atrial fibrillation with RVR.  EKG from October 2021 shows normal sinus rhythm and regular heart rate.  Given patient's symptoms and atrial fibrillation with RVR, advised daughter that he needs to go to the emergency department for further work-up, monitoring and likely admission.  Daughter is in agreement.  Loma Sousa, RN established IV access and checked glucose which was 205.  Patient has apparently not eaten in a couple of days.  EMS arrived and report given.  Patient leaving in stable condition in route to ED.   Final Clinical Impressions(s) / UC Diagnoses   Final diagnoses:  Atrial fibrillation with RVR (HCC)  Weakness  Loss of appetite     Discharge Instructions      You have been advised to follow  up immediately in the emergency department for concerning signs.symptoms. If you declined EMS transport, please have a family member take you directly to the ED at this time. Do not delay. Based on concerns about condition, if you do not follow up in th e ED, you may risk poor outcomes including worsening of condition, delayed treatment and potentially life threatening issues. If you have declined to go to the ED at this time, you should call your PCP immediately to set up a follow up appointment.  Go to ED for red flag symptoms, including; fevers you cannot reduce with Tylenol/Motrin, severe headaches, vision changes, numbness/weakness in part of the body, lethargy, confusion, intractable vomiting, severe dehydration, chest pain, breathing difficulty, severe persistent abdominal or pelvic pain, signs of severe infection (increased redness, swelling of an area), feeling faint or passing out, dizziness, etc. You should especially go to the ED for sudden acute worsening of condition if you do not elect to go at this time.       ED Prescriptions   None    PDMP not reviewed this encounter.   Danton Clap, PA-C 12/07/20 2014

## 2020-12-07 NOTE — Consult Note (Signed)
PHARMACY -  BRIEF ANTIBIOTIC NOTE   Pharmacy has received consult(s) for vancomycin and cefepime from an ED provider.  The patient's profile has been reviewed for ht/wt/allergies/indication/available labs.    One time order(s) placed for cefepime 2 g and vancomycin 1750 mg IV  Further antibiotics/pharmacy consults should be ordered by admitting physician if indicated.                       Thank you, Darnelle Bos, PharmD 12/07/2020  9:11 PM

## 2020-12-07 NOTE — ED Provider Notes (Signed)
[facility] Emergency Department Provider Note  ____________________________________________   None    (approximate)  I have reviewed the triage vital signs and the nursing notes.   HISTORY  Chief Complaint Atrial Fibrillation (Chronic)  History is provided by the patient's adult daughter, who is at bedside. He is non-verbal (dysarthric) from a previous CVA.   HPI Todd [name] is a 87 y.o. male presents to the ED via EMS for [facility].  He was present with his adult daughter, for evaluation of a 2 to 3-day history of increased weakness, fatigue, and loss of appetite.  Patient medical history is significant for CVA that causes difficulty communicating verbally, Parkinson's disease, diabetes, aortic regurgitation, CAD, hypertension, hyperlipidemia, and A. fib on Eliquis.  According to the daughter, the patient's heart rate is typically normal.  He takes digoxin for his A. fib, hydralazine, as well as furosemide.  The patient has not had any increased need for supplemental home O2, but according to the daughter, is indicated that he may have some left arm pain.  She denies any noted fevers, chills, sweats, nausea, vomiting, or diarrhea.     Past Medical History:  Diagnosis Date   Anxiety    Aortic regurgitation    a. 03/2017 Echo: Mod AI; b. 05/2017 Echo: Triv AI.   Arthritis    osteoarthritis-left knee   CAD (coronary artery disease)    a. Reported h/o cath ~ 2008 - ? small vessel dzs. No PCI performed; b. 03/2017 MV: EF 45-54%, no ischemia/infarct.   Depression    Diabetes mellitus without complication ([place])    Diastolic dysfunction    a. 03/2017 Echo: EF 55-65%; b. 05/2017 Echo: EF 55-60%, no rwma, Gr1 DD. Triv AI.  Mildly dil LA.   ED (erectile dysfunction)    Elevated troponin    a. chronic mild elevation - 0.03->0.04.   GERD (gastroesophageal reflux disease)    Glaucoma    Hyperlipidemia    Hypertension    Iron deficiency anemia due to  chronic blood loss    Left pontine stroke w/ cerebrovascular disease(HCC)    a. 05/2017 MRI/A: acute to subacute L pontine infarct. Chronic left thalamic lacunar infarct. Severe basilar artery stenosis w/ radiographic string sign of the distal 1/3. Mild to moderate L PCA P2 segment stenosis; b. 05/2017 Carotid U/S: <50% bilat ICA stenosis.   Lumbago    Lumbosacral Neuritis   Prostate cancer [facility])     Patient Active Problem List   Diagnosis Date Noted   A-fib ([place]) 12/08/2020   Paroxysmal atrial fibrillation with rapid ventricular response ([place]) 12/07/2020   Lower urinary tract symptoms (LUTS) 05/19/2020   Atrial fibrillation with rapid ventricular response ([place]) 11/11/2018   Frequent PVCs    Anemia due to stage 3 chronic kidney disease ([place]) 09/16/2018   Normocytic anemia 05/17/2018   Hemiplegia ([place]) 04/25/2018   B12 deficiency 04/25/2018   Dysphagia as late effect of cerebrovascular accident (CVA) 04/25/2018   Depression, major, single episode, severe ([place]) 04/25/2018   CHF (congestive heart failure) ([place]) 01/09/2018   Advanced care planning/counseling discussion 12/20/2017   Hypoxia 11/02/2017   Mixed hyperlipidemia 09/21/2017   Aphasia 05/18/2017   CAD (coronary artery disease) 05/18/2017   History of stroke with residual deficit 05/18/2017   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 11/09/2015   Knee joint replaced by other means 07/04/2015   Urinary incontinence 04/27/2015   Anxiety, generalized 04/07/2015   History of BCG vaccination 04/07/2015   GERD (gastroesophageal  reflux disease) 03/04/2015   Degeneration of intervertebral disc of lumbar region 02/23/2015   Neuritis or radiculitis due to rupture of lumbar intervertebral disc 02/23/2015   Lumbar stenosis with neurogenic claudication 02/23/2015   Hypertension 12/02/2014   Type 2 diabetes mellitus with peripheral neuropathy ([place]) 12/02/2014   Benign essential tremor 03/13/2014   Imbalance 03/13/2014   Prostate cancer ([place])  10/07/2012   ED (erectile dysfunction) of organic origin 02/09/2012    Past Surgical History:  Procedure Laterality Date   CARPAL TUNNEL RELEASE     REPLACEMENT TOTAL KNEE BILATERAL      Prior to Admission medications   Medication Sig Start Date End Date Taking? Authorizing Provider  acetaminophen (TYLENOL) 650 MG CR tablet Take 650 mg by mouth every 8 (eight) hours as needed for pain.   Yes [provider]  apixaban (ELIQUIS) 5 MG TABS tablet Take 1 tablet (5 mg total) by mouth 2 (two) times daily. 11/29/20  Yes Johnson, Megan P, DO  aspirin EC 81 MG tablet Take 81 mg by mouth daily. Swallow whole.   Yes [provider]  atorvastatin (LIPITOR) 20 MG tablet Take 1 tablet (20 mg total) by mouth daily. 11/29/20  Yes Johnson, Megan P, DO  carbidopa-levodopa (SINEMET IR) 25-100 MG tablet Take 1 tablet by mouth 3 (three) times daily. 09/22/20  Yes [provider]  digoxin (LANOXIN) 0.125 MG tablet Take 1 tablet (0.125 mg total) by mouth every other day. 11/05/20  Yes Gollan, Kathlene November, MD  docusate sodium (COLACE) 100 MG capsule Take 1 capsule (100 mg total) by mouth 2 (two) times daily as needed for mild constipation. 11/29/20  Yes Johnson, Megan P, DO  DULoxetine (CYMBALTA) 30 MG capsule Take 1 capsule (30 mg total) by mouth daily. 11/29/20  Yes Johnson, Megan P, DO  empagliflozin (JARDIANCE) 25 MG TABS tablet Take 1 tablet (25 mg total) by mouth daily before breakfast. 11/29/20  Yes Johnson, Megan P, DO  furosemide (LASIX) 20 MG tablet Take 1.5 tablets (30 mg) by mouth once daily 07/12/20  Yes Visser, Jacquelyn D, PA-C  gabapentin (NEURONTIN) 100 MG capsule Take 1 capsule (100 mg total) by mouth at bedtime. 11/29/20  Yes Johnson, Megan P, DO  hydrALAZINE (APRESOLINE) 50 MG tablet Take 1 tablet (50 mg total) by mouth 3 (three) times daily. 11/29/20  Yes Johnson, Megan P, DO  latanoprost (XALATAN) 0.005 % ophthalmic solution Place 1 drop into both eyes at bedtime.  06/08/16  Yes  [provider]  Magnesium Oxide 400 (240 Mg) MG TABS Take 1 tablet (400 mg total) by mouth 2 (two) times daily. 07/01/19  Yes Dunn, Areta Haber, PA-C  metFORMIN (GLUCOPHAGE) 500 MG tablet Take 1 tablet (500 mg total) by mouth 2 (two) times daily with a meal. 11/29/20  Yes Johnson, Megan P, DO  naproxen sodium (ALEVE) 220 MG tablet Take 220 mg by mouth 2 (two) times daily as needed.    Yes [provider]  pantoprazole (PROTONIX) 40 MG tablet Take 1 tablet (40 mg total) by mouth daily. 11/29/20  Yes Johnson, Megan P, DO  Polyethylene Glycol 3350 (MIRALAX PO) Take 17 g by mouth as needed.   Yes [provider]  propranolol ER (INDERAL LA) 120 MG 24 hr capsule Take 1 capsule (120 mg total) by mouth daily. 03/22/20  Yes Gollan, Kathlene November, MD  tamsulosin (FLOMAX) 0.4 MG CAPS capsule Take 1 capsule (0.4 mg total) by mouth daily. 05/31/20  Yes Johnson, [place], DO  blood glucose meter kit and supplies KIT Dispense based on patient and insurance preference. Use up to four times daily as directed. (FOR ICD-9 250.00, 250.01). 11/17/18   Dustin Flock, MD  Blood Glucose Monitoring Suppl (ACCU-CHEK AVIVA PLUS) w/Device KIT Use to check sugar levels x 2 daily 11/17/18   Dustin Flock, MD  Lancets (ACCU-CHEK SOFT [facility]) lancets Use as instructed 11/17/18   Dustin Flock, MD  [facility] ULTRA test strip USE TO CHECK SUGAR LEVELS TWICE DAILY. 11/18/18   Guadalupe Maple, MD    Allergies Amlodipine, Fluoxetine, and Meloxicam  Family History  Problem Relation Age of Onset   Stroke Mother    Hypertension Mother    Hypertension Father    Stroke Sister    Prostate cancer Neg Hx    Kidney cancer Neg Hx    Bladder Cancer Neg Hx     Social History Social History   Tobacco Use   Smoking status: Former    Pack years: 0.00    Types: Cigarettes    Quit date: 12/02/1994    Years since quitting: 26.0   Smokeless tobacco: Never  Vaping Use   Vaping Use: Never used  Substance Use Topics    Alcohol use: Yes    Alcohol/week: 1.0 standard drink    Types: 1 Cans of beer per week    Comment: occasional   Drug use: No    Review of Systems  Constitutional: No fever/chills Eyes: No visual changes. ENT: No sore throat. Cardiovascular: Denies chest pain. Respiratory: Denies shortness of breath. Gastrointestinal: Reports poor appetite. No abdominal pain.  No nausea, no vomiting.  No diarrhea.  No constipation. Genitourinary: Negative for dysuria. Musculoskeletal: Negative for back pain. Skin: Negative for rash. Neurological: Negative for headaches, focal weakness or numbness. ____________________________________________   PHYSICAL EXAM:  VITAL SIGNS: ED Triage Vitals  Enc Vitals Group     BP 12/07/20 1712 120/83     Pulse Rate 12/07/20 1712 (!) 140     Resp 12/07/20 1712 (!) 23     Temp 12/07/20 1712 97.7 F (36.5 C)     Temp Source 12/07/20 1712 Oral     SpO2 12/07/20 1712 100 %     Weight 12/07/20 1705 170 lb (77.1 kg)     Height 12/07/20 1705 _0  (1.727 m)     Head Circumference --      Peak Flow --      Pain Score --      Pain Loc --      Pain Edu? --      Excl. in [place]? --     Constitutional: Alert and oriented. Well appearing and in no acute distress. Eyes: Conjunctivae are normal. PERRL. EOMI. Head: Atraumatic. Nose: No congestion/rhinnorhea. Mouth/Throat: Mucous membranes are moist.  Oropharynx non-erythematous. Neck: No stridor.   Cardiovascular: Normal rate, regular rhythm. Grossly normal heart sounds.  Good peripheral circulation. Respiratory: Normal respiratory effort.  No retractions. Lungs CTAB. Gastrointestinal: Soft and nontender. No distention. No abdominal bruits. No CVA tenderness. Musculoskeletal: No lower extremity tenderness nor edema.  No joint effusions. Neurologic:  No gross focal neurologic deficits are appreciated.  Skin:  Skin is warm, dry and intact. No rash noted. ____________________________________________   LABS (all labs  ordered are listed, but only abnormal results are displayed)  Labs Reviewed  COMPREHENSIVE METABOLIC PANEL - Abnormal; Notable for the following components:      Result Value   Glucose, Bld 202 (*)    BUN 24 (*)  Creatinine, Ser 1.40 (*)    Calcium 8.5 (*)    GFR, Estimated 48 (*)    All other components within normal limits  CBC WITH DIFFERENTIAL/PLATELET - Abnormal; Notable for the following components:   RBC 3.16 (*)    Hemoglobin 9.9 (*)    HCT 31.3 (*)    All other components within normal limits  LACTIC ACID, PLASMA - Abnormal; Notable for the following components:   Lactic Acid, Venous 2.0 (*)    All other components within normal limits  URINALYSIS, COMPLETE (UACMP) WITH MICROSCOPIC - Abnormal; Notable for the following components:   Color, Urine YELLOW (*)    APPearance CLEAR (*)    Glucose, UA >=500 (*)    Hgb urine dipstick MODERATE (*)    Ketones, ur 5 (*)    All other components within normal limits  DIGOXIN LEVEL - Abnormal; Notable for the following components:   Digoxin Level 0.2 (*)    All other components within normal limits  MAGNESIUM - Abnormal; Notable for the following components:   Magnesium 1.4 (*)    All other components within normal limits  CBC - Abnormal; Notable for the following components:   RBC 3.17 (*)    Hemoglobin 9.8 (*)    HCT 30.8 (*)    All other components within normal limits  BASIC METABOLIC PANEL - Abnormal; Notable for the following components:   Glucose, Bld 184 (*)    BUN 24 (*)    Creatinine, Ser 1.35 (*)    Calcium 8.3 (*)    GFR, Estimated 50 (*)    All other components within normal limits  GLUCOSE, CAPILLARY - Abnormal; Notable for the following components:   Glucose-Capillary 201 (*)    All other components within normal limits  CBG MONITORING, ED - Abnormal; Notable for the following components:   Glucose-Capillary 157 (*)    All other components within normal limits  TROPONIN I (HIGH SENSITIVITY) - Abnormal;  Notable for the following components:   Troponin I (High Sensitivity) 95 (*)    All other components within normal limits  TROPONIN I (HIGH SENSITIVITY) - Abnormal; Notable for the following components:   Troponin I (High Sensitivity) 96 (*)    All other components within normal limits  RESP PANEL BY RT-PCR (FLU A&B, COVID) [id number]  CULTURE, BLOOD (ROUTINE X 2) W REFLEX TO ID PANEL  CULTURE, BLOOD (ROUTINE X 2) W REFLEX TO ID PANEL  LACTIC ACID, PLASMA   ____________________________________________  EKG  A-fib with RVR Vent. rate 143 BPM PR interval 136 ms QRS duration 104 ms QT/QTcB 323/499 ms P-R-T axes 249 -81 94 No STEMI ____________________________________________  RADIOLOGY  I, Melvenia Needles, personally viewed and evaluated these images (plain radiographs) as part of my medical decision making, as well as reviewing the written report by the radiologist.  ED MD interpretation:  agree with report  Official radiology report(s): CT ABDOMEN PELVIS WO CONTRAST  Result Date: 12/07/2020 CLINICAL DATA:  Abdomen pain poor appetite EXAM: CT ABDOMEN AND PELVIS WITHOUT CONTRAST TECHNIQUE: Multidetector CT imaging of the abdomen and pelvis was performed following the standard protocol without IV contrast. COMPARISON:  CT 11/11/2018 FINDINGS: Lower chest: Lung bases demonstrate no acute consolidation or effusion. Cardiomegaly. Coronary vascular calcification. Hepatobiliary: No focal hepatic abnormality. Small calcified gallstones. No biliary dilatation Pancreas: Unremarkable. No pancreatic ductal dilatation or surrounding inflammatory changes. Spleen: Normal in size without focal abnormality. Adrenals/Urinary Tract: Adrenal glands are within normal limits. Kidneys show no hydronephrosis.  Low-density lesions in both kidneys likely cysts. Punctate stone lower pole right kidney. The bladder is unremarkable. Stomach/Bowel: The stomach is nonenlarged. No dilated small bowel. No acute bowel  wall thickening. Nonvisualized appendix. Vascular/Lymphatic: Moderate aortic atherosclerosis. No aneurysm. No suspicious nodes. Reproductive: Post treatment changes of the prostate. Other: Negative for pelvic effusion or free air Musculoskeletal: Scoliosis and advanced degenerative changes throughout the spine IMPRESSION: 1. No CT evidence for acute intra-abdominal or pelvic abnormality. 2. Gallstones 3. Punctate nonobstructing right kidney stone 4. Cardiomegaly Electronically Signed   By: Donavan Foil M.D.   On: 12/07/2020 21:53   DG Chest Port 1 View  Result Date: 12/07/2020 CLINICAL DATA:  Weakness. EXAM: PORTABLE CHEST 1 VIEW COMPARISON:  Chest x-ray dated June 10, 2020. FINDINGS: Unchanged mild cardiomegaly. Persistent low lung volumes. No focal consolidation, pleural effusion, or pneumothorax. Unchanged mild elevation of the right hemidiaphragm. No acute osseous abnormality. IMPRESSION: No active disease. Electronically Signed   By: Titus Dubin M.D.   On: 12/07/2020 18:23    ____________________________________________   PROCEDURES  Procedure(s) performed (including Critical Care):  Procedures  NS 500 ml bolus x 2 ____________________________________________   INITIAL IMPRESSION / ASSESSMENT AND PLAN / ED COURSE  As part of my medical decision making, I reviewed the following data within the [place] History obtained from family, Labs reviewed as above, Old chart reviewed, Radiograph reviewed NAD, Evaluated by EM attending C. Ellender Hose, MD, and Notes from prior ED visits   DDX: sepsis, CAP, AKI, dehydration, a. Fib  Geriatric patient ED evaluation of several days of poor feeding as reported by the family.  Patient with history of A. fib, was presented to a local urgent care where he was found to be in A. fib with RVR.  He was then transferred to the ED for further evaluation management of his complaints.  Patient was at baseline according to the daughter in terms  of comprehension and cognition.  He was found to have critical lactic at 2.0 on initial presentation and blood pressures were trending down with heart rates in the 130s to 140s.  Patient maintained A. fib RVR while in the ED.  Subsequent hypotension was managed with fluid boluses as well as an infusion of phenylephrine.  Patient care was subsequently transferred to my attending just prior to shift change.  The plan is for the patient to be admitted to the hospital service for continued management of his A. fib RVR, hypertension, and management of his critical labs.  Family is at bedside, and is agreeable to the plan.  ____________________________________________   FINAL CLINICAL IMPRESSION(S) / ED DIAGNOSES  Final diagnoses:  Sepsis, due to unspecified organism, unspecified whether acute organ dysfunction present [facility])  Atrial fibrillation with rapid ventricular response [facility])     ED Discharge Orders     None        Note:  This document was prepared using Dragon voice recognition software and may include unintentional dictation errors.    Melvenia Needles, PA-C 12/08/20 1119    Duffy Bruce, MD 12/12/20 2018

## 2020-12-07 NOTE — H&P (Signed)
History and Physical    Todd [name] XTA:[id number] DOB: 05-27-1931 DOA: 12/07/2020  PCP: Valerie Roys, DO  Patient coming from: Urgent care  I have personally briefly reviewed patient's old medical records in [place]  Chief Complaint: Generalized weakness, A. fib  HPI: Todd [name] is a 87 y.o. male with medical history significant for PAF on Eliquis and digoxin, CAD, history of CVA (with residual right-sided weakness, incoordination, dysarthria/dysphonia), CKD stage IIIa, T2DM, HTN, HLD, anemia of chronic disease and iron deficiency, BPH, depression/anxiety, Parkinson's disease, and vascular dementia who presented to the ED for evaluation of generalized weakness.  History is limited from patient due to chronic dysarthria and is otherwise supplemented by EDP, chart review, and patient's daughter at bedside.  Patient lives with his son and son's wife who are his caregivers.  Over the last week he has been feeling generally weak with decreased oral intake.  He has not had any obvious complaints other than apparent left elbow pain.  He was taken to urgent care earlier today (7/5) for evaluation he was noted to be in atrial fibrillation with RVR subsequently sent to the ED for further evaluation.  ED Course:  Initial vitals showed BP 93/69, pulse 128, RR 24, temp 97.7 F, SPO2 100% on 4 L supplemental O2 via [place].  Labs show WBC 7.1, hemoglobin 9.9, platelets 199,000, sodium 139, potassium 4.1, bicarb 29, BUN 24, creatinine 1.40, serum glucose 202, high-sensitivity troponin I 95 > 96, lactic acid 2.0 > 1.5, digoxin level 0.2.  SARS-CoV-2 PCR negative.  Influenza A/B PCR negative.  Urinalysis and blood cultures ordered and pending collection.  Portable chest x-ray was negative for focal consolidation, edema, effusion.  CT abdomen/pelvis without contrast was negative for acute intra-abdominal or pelvic abnormality.  Gallstones noted.  Punctate nonobstructing right kidney stone  seen.  Patient was given 1.5 L NS.  He was given 10 mg IV diltiazem.  He was transiently hypotensive and required Neo-Synephrine with improvement in blood pressure and has been titrated off.  The hospitalist service was consulted to admit for further evaluation and management.  Review of Systems:  Limited review of systems due to significant dysarthria.   Past Medical History:  Diagnosis Date   Anxiety    Aortic regurgitation    a. 03/2017 Echo: Mod AI; b. 05/2017 Echo: Triv AI.   Arthritis    osteoarthritis-left knee   CAD (coronary artery disease)    a. Reported h/o cath ~ 2008 - ? small vessel dzs. No PCI performed; b. 03/2017 MV: EF 45-54%, no ischemia/infarct.   Depression    Diabetes mellitus without complication ([place])    Diastolic dysfunction    a. 03/2017 Echo: EF 55-65%; b. 05/2017 Echo: EF 55-60%, no rwma, Gr1 DD. Triv AI.  Mildly dil LA.   ED (erectile dysfunction)    Elevated troponin    a. chronic mild elevation - 0.03->0.04.   GERD (gastroesophageal reflux disease)    Glaucoma    Hyperlipidemia    Hypertension    Iron deficiency anemia due to chronic blood loss    Left pontine stroke w/ cerebrovascular disease(HCC)    a. 05/2017 MRI/A: acute to subacute L pontine infarct. Chronic left thalamic lacunar infarct. Severe basilar artery stenosis w/ radiographic string sign of the distal 1/3. Mild to moderate L PCA P2 segment stenosis; b. 05/2017 Carotid U/S: <50% bilat ICA stenosis.   Lumbago    Lumbosacral Neuritis   Prostate cancer [facility])     Past Surgical  History:  Procedure Laterality Date   CARPAL TUNNEL RELEASE     REPLACEMENT TOTAL KNEE BILATERAL      Social History:  reports that he quit smoking about 26 years ago. He has never used smokeless tobacco. He reports current alcohol use of about 1.0 standard drink of alcohol per week. He reports that he does not use drugs.  Allergies  Allergen Reactions   Amlodipine     Lower extremity swelling at 10 mg.    Fluoxetine Other (See Comments)    headache   Meloxicam Other (See Comments)    Other reaction(s): Dizziness Nervousness.     Family History  Problem Relation Age of Onset   Stroke Mother    Hypertension Mother    Hypertension Father    Stroke Sister    Prostate cancer Neg Hx    Kidney cancer Neg Hx    Bladder Cancer Neg Hx      Prior to Admission medications   Medication Sig Start Date End Date Taking? Authorizing Provider  acetaminophen (TYLENOL) 650 MG CR tablet Take 650 mg by mouth every 8 (eight) hours as needed for pain.    [provider]  apixaban (ELIQUIS) 5 MG TABS tablet Take 1 tablet (5 mg total) by mouth 2 (two) times daily. 11/29/20   Johnson, Megan P, DO  atorvastatin (LIPITOR) 20 MG tablet Take 1 tablet (20 mg total) by mouth daily. 11/29/20   Park Liter P, DO  blood glucose meter kit and supplies KIT Dispense based on patient and insurance preference. Use up to four times daily as directed. (FOR ICD-9 250.00, 250.01). 11/17/18   Dustin Flock, MD  Blood Glucose Monitoring Suppl (ACCU-CHEK AVIVA PLUS) w/Device KIT Use to check sugar levels x 2 daily 11/17/18   Dustin Flock, MD  carbidopa-levodopa (SINEMET IR) 25-100 MG tablet Take 1 tablet by mouth 3 (three) times daily. 09/22/20   [provider]  digoxin (LANOXIN) 0.125 MG tablet Take 1 tablet (0.125 mg total) by mouth every other day. 11/05/20   Minna Merritts, MD  docusate sodium (COLACE) 100 MG capsule Take 1 capsule (100 mg total) by mouth 2 (two) times daily as needed for mild constipation. 11/29/20   Johnson, Megan P, DO  DULoxetine (CYMBALTA) 30 MG capsule Take 1 capsule (30 mg total) by mouth daily. 11/29/20   Park Liter P, DO  empagliflozin (JARDIANCE) 25 MG TABS tablet Take 1 tablet (25 mg total) by mouth daily before breakfast. 11/29/20   Park Liter P, DO  furosemide (LASIX) 20 MG tablet Take 1.5 tablets (30 mg) by mouth once daily 07/12/20   Marrianne Mood D, PA-C  gabapentin  (NEURONTIN) 100 MG capsule Take 1 capsule (100 mg total) by mouth at bedtime. 11/29/20   Park Liter P, DO  hydrALAZINE (APRESOLINE) 50 MG tablet Take 1 tablet (50 mg total) by mouth 3 (three) times daily. 11/29/20   Park Liter P, DO  Lancets (ACCU-CHEK SOFT TOUCH) lancets Use as instructed 11/17/18   Dustin Flock, MD  latanoprost (XALATAN) 0.005 % ophthalmic solution Place 1 drop into both eyes at bedtime.  06/08/16   [provider]  Magnesium Oxide 400 (240 Mg) MG TABS Take 1 tablet (400 mg total) by mouth 2 (two) times daily. 07/01/19   Dunn, Areta Haber, PA-C  metFORMIN (GLUCOPHAGE) 500 MG tablet Take 1 tablet (500 mg total) by mouth 2 (two) times daily with a meal. 11/29/20   Johnson, Megan P, DO  naproxen sodium (ALEVE) 220  MG tablet Take 220 mg by mouth 2 (two) times daily as needed.     [provider]  ONETOUCH ULTRA test strip USE TO CHECK SUGAR LEVELS TWICE DAILY. 11/18/18   Guadalupe Maple, MD  pantoprazole (PROTONIX) 40 MG tablet Take 1 tablet (40 mg total) by mouth daily. 11/29/20   Johnson, Megan P, DO  Polyethylene Glycol 3350 (MIRALAX PO) Take by mouth as needed.    [provider]  propranolol ER (INDERAL LA) 120 MG 24 hr capsule Take 1 capsule (120 mg total) by mouth daily. 03/22/20   Minna Merritts, MD  tamsulosin (FLOMAX) 0.4 MG CAPS capsule Take 1 capsule (0.4 mg total) by mouth daily. 05/31/20   Valerie Roys, DO    Physical Exam: Vitals:   12/07/20 2030 12/07/20 2035 12/07/20 2045 12/07/20 2110  BP: 105/75 (!) 106/93 111/73 115/80  Pulse: 80 67 63 90  Resp: 18 16 14 17   Temp:      TempSrc:      SpO2: 100% 100% 99% 100%  Weight:      Height:       Constitutional: Resting supine in bed, NAD, calm, comfortable Eyes: PERRL, lids and conjunctivae normal ENMT: Mucous membranes are moist. Posterior pharynx clear of any exudate or lesions.poor dentition.  Neck: normal, supple, no masses. Respiratory: clear to auscultation bilaterally, no  wheezing, no crackles. Normal respiratory effort. No accessory muscle use.  Cardiovascular: Irregularly irregular, tachycardic, no murmurs / rubs / gallops. No extremity edema. 2+ pedal pulses. Abdomen: no tenderness, no masses palpated. No hepatosplenomegaly. Bowel sounds positive.  Musculoskeletal: no clubbing / cyanosis. No joint deformity upper and lower extremities. No contractures. Normal muscle tone.  Skin: no rashes, lesions, ulcers. No induration Neurologic: CN 2-12 grossly intact.  Dysarthric speech.  Strength 4/5 RLE otherwise intact other extremities. Psychiatric: Awake, alert, speech is dysarthric but seems to understand questions and answers with nodding head yes/no.  Labs on Admission: I have personally reviewed following labs and imaging studies  CBC: Recent Labs  Lab 12/07/20 1719  WBC 7.1  NEUTROABS 4.4  HGB 9.9*  HCT 31.3*  MCV 99.1  PLT 494   Basic Metabolic Panel: Recent Labs  Lab 12/07/20 1719  NA 139  K 4.1  CL 103  CO2 29  GLUCOSE 202*  BUN 24*  CREATININE 1.40*  CALCIUM 8.5*   GFR: Estimated Creatinine Clearance: 35.3 mL/min (A) (by C-G formula based on SCr of 1.4 mg/dL (H)). Liver Function Tests: Recent Labs  Lab 12/07/20 1719  AST 20  ALT <5  ALKPHOS 52  BILITOT 0.6  PROT 6.7  ALBUMIN 3.7   No results for input(s): LIPASE, AMYLASE in the last 168 hours. No results for input(s): AMMONIA in the last 168 hours. Coagulation Profile: No results for input(s): INR, PROTIME in the last 168 hours. Cardiac Enzymes: No results for input(s): CKTOTAL, CKMB, CKMBINDEX, TROPONINI in the last 168 hours. BNP (last 3 results) No results for input(s): PROBNP in the last 8760 hours. HbA1C: No results for input(s): HGBA1C in the last 72 hours. CBG: Recent Labs  Lab 12/07/20 1622  GLUCAP 205*   Lipid Profile: No results for input(s): CHOL, HDL, LDLCALC, TRIG, CHOLHDL, LDLDIRECT in the last 72 hours. Thyroid Function Tests: No results for  input(s): TSH, T4TOTAL, FREET4, T3FREE, THYROIDAB in the last 72 hours. Anemia Panel: No results for input(s): VITAMINB12, FOLATE, FERRITIN, TIBC, IRON, RETICCTPCT in the last 72 hours. Urine analysis:    Component Value Date/Time  COLORURINE YELLOW 06/10/2020 1856   APPEARANCEUR Clear 08/30/2020 1449   LABSPEC 1.010 06/10/2020 1856   PHURINE 5.5 06/10/2020 1856   GLUCOSEU 3+ (A) 08/30/2020 1449   HGBUR NEGATIVE 06/10/2020 1856   BILIRUBINUR Negative 08/30/2020 1449   KETONESUR NEGATIVE 06/10/2020 1856   PROTEINUR Negative 08/30/2020 1449   PROTEINUR NEGATIVE 06/10/2020 1856   NITRITE Negative 08/30/2020 1449   NITRITE NEGATIVE 06/10/2020 1856   LEUKOCYTESUR Negative 08/30/2020 1449   LEUKOCYTESUR NEGATIVE 06/10/2020 1856    Radiological Exams on Admission: CT ABDOMEN PELVIS WO CONTRAST  Result Date: 12/07/2020 CLINICAL DATA:  Abdomen pain poor appetite EXAM: CT ABDOMEN AND PELVIS WITHOUT CONTRAST TECHNIQUE: Multidetector CT imaging of the abdomen and pelvis was performed following the standard protocol without IV contrast. COMPARISON:  CT 11/11/2018 FINDINGS: Lower chest: Lung bases demonstrate no acute consolidation or effusion. Cardiomegaly. Coronary vascular calcification. Hepatobiliary: No focal hepatic abnormality. Small calcified gallstones. No biliary dilatation Pancreas: Unremarkable. No pancreatic ductal dilatation or surrounding inflammatory changes. Spleen: Normal in size without focal abnormality. Adrenals/Urinary Tract: Adrenal glands are within normal limits. Kidneys show no hydronephrosis. Low-density lesions in both kidneys likely cysts. Punctate stone lower pole right kidney. The bladder is unremarkable. Stomach/Bowel: The stomach is nonenlarged. No dilated small bowel. No acute bowel wall thickening. Nonvisualized appendix. Vascular/Lymphatic: Moderate aortic atherosclerosis. No aneurysm. No suspicious nodes. Reproductive: Post treatment changes of the prostate. Other:  Negative for pelvic effusion or free air Musculoskeletal: Scoliosis and advanced degenerative changes throughout the spine IMPRESSION: 1. No CT evidence for acute intra-abdominal or pelvic abnormality. 2. Gallstones 3. Punctate nonobstructing right kidney stone 4. Cardiomegaly Electronically Signed   By: Donavan Foil M.D.   On: 12/07/2020 21:53   DG Chest Port 1 View  Result Date: 12/07/2020 CLINICAL DATA:  Weakness. EXAM: PORTABLE CHEST 1 VIEW COMPARISON:  Chest x-ray dated June 10, 2020. FINDINGS: Unchanged mild cardiomegaly. Persistent low lung volumes. No focal consolidation, pleural effusion, or pneumothorax. Unchanged mild elevation of the right hemidiaphragm. No acute osseous abnormality. IMPRESSION: No active disease. Electronically Signed   By: Titus Dubin M.D.   On: 12/07/2020 18:23    EKG: Personally reviewed.  A. fib with RVR, rate 143.  Assessment/Plan Principal Problem:   Paroxysmal atrial fibrillation with rapid ventricular response (HCC) Active Problems:   Hypertension   Type 2 diabetes mellitus with peripheral neuropathy (HCC)   CAD (coronary artery disease)   History of stroke with residual deficit   Mixed hyperlipidemia   Depression, major, single episode, severe (HCC)   Normocytic anemia   Anemia due to stage 3 chronic kidney disease (HCC)   Todd [name] is a 87 y.o. male with medical history significant for PAF on Eliquis and digoxin, CAD, history of CVA (with residual right-sided weakness, incoordination, dysarthria/dysphonia), CKD stage IIIa, T2DM, HTN, HLD, anemia of chronic disease and iron deficiency, BPH, depression/anxiety, Parkinson's disease, and vascular dementia who is admitted with A. fib with RVR.  Paroxysmal atrial fibrillation with RVR: Remains in atrial fibrillation with rates fluctuating between 110-140s.  BP dropped after receiving IV diltiazem in the ED.  Not responding to IV metoprolol.  Review of records show history of difficult to control heart  rate until he was started on amiodarone previously. -Start IV amiodarone with bolus followed by infusion -Continue Eliquis -Continue digoxin  Type 2 diabetes: Placed on SSI.  Hypertension: Hold antihypertensives due to hypotension on arrival.  CAD Elevated troponin: Denies any chest pain.  Troponin flat and likely demand ischemia from A.  fib with RVR.  History of CVA with residual deficit (right-sided weakness, incoordination, dysarthria/dysphonia): Chronic appears stable.  Continue Eliquis and statin.  Anemia of chronic disease and iron deficiency: Chronic and stable.  CKD stage IIIa: Stable, continue monitor.  Depression/anxiety: Continue Cymbalta.  Vascular dementia/suspected Parkinson's disease: Appears stable.   DVT prophylaxis: Eliquis Code Status: Full code Family Communication: Discussed with patient's daughter at bedside Disposition Plan: From home, dispo pending clinical progress Consults called: None Level of care: Progressive Cardiac Admission status:  Status is: Observation  The patient remains OBS appropriate and will d/c before 2 midnights.  Dispo: The patient is from: Home              Anticipated d/c is to: Home              Patient currently is not medically stable to d/c.  Zada Finders MD  [facility]  If 7PM-7AM, please contact night-coverage [email or web address]  12/07/2020, 10:38 PM

## 2020-12-08 ENCOUNTER — Inpatient Hospital Stay: Payer: HMO

## 2020-12-08 ENCOUNTER — Other Ambulatory Visit: Payer: Self-pay

## 2020-12-08 ENCOUNTER — Encounter: Payer: Self-pay | Admitting: Internal Medicine

## 2020-12-08 DIAGNOSIS — Z8679 Personal history of other diseases of the circulatory system: Secondary | ICD-10-CM

## 2020-12-08 DIAGNOSIS — E1122 Type 2 diabetes mellitus with diabetic chronic kidney disease: Secondary | ICD-10-CM | POA: Diagnosis present

## 2020-12-08 DIAGNOSIS — E872 Acidosis: Secondary | ICD-10-CM | POA: Diagnosis present

## 2020-12-08 DIAGNOSIS — I4819 Other persistent atrial fibrillation: Secondary | ICD-10-CM | POA: Diagnosis not present

## 2020-12-08 DIAGNOSIS — R079 Chest pain, unspecified: Secondary | ICD-10-CM | POA: Diagnosis not present

## 2020-12-08 DIAGNOSIS — N1831 Chronic kidney disease, stage 3a: Secondary | ICD-10-CM | POA: Diagnosis present

## 2020-12-08 DIAGNOSIS — I69320 Aphasia following cerebral infarction: Secondary | ICD-10-CM

## 2020-12-08 DIAGNOSIS — R062 Wheezing: Secondary | ICD-10-CM | POA: Diagnosis not present

## 2020-12-08 DIAGNOSIS — J189 Pneumonia, unspecified organism: Secondary | ICD-10-CM | POA: Diagnosis present

## 2020-12-08 DIAGNOSIS — I25118 Atherosclerotic heart disease of native coronary artery with other forms of angina pectoris: Secondary | ICD-10-CM | POA: Diagnosis not present

## 2020-12-08 DIAGNOSIS — I4891 Unspecified atrial fibrillation: Secondary | ICD-10-CM | POA: Diagnosis present

## 2020-12-08 DIAGNOSIS — I351 Nonrheumatic aortic (valve) insufficiency: Secondary | ICD-10-CM | POA: Diagnosis present

## 2020-12-08 DIAGNOSIS — D631 Anemia in chronic kidney disease: Secondary | ICD-10-CM

## 2020-12-08 DIAGNOSIS — E1169 Type 2 diabetes mellitus with other specified complication: Secondary | ICD-10-CM

## 2020-12-08 DIAGNOSIS — F0150 Vascular dementia without behavioral disturbance: Secondary | ICD-10-CM | POA: Diagnosis present

## 2020-12-08 DIAGNOSIS — N189 Chronic kidney disease, unspecified: Secondary | ICD-10-CM | POA: Diagnosis not present

## 2020-12-08 DIAGNOSIS — G20 Parkinson's disease: Secondary | ICD-10-CM | POA: Diagnosis present

## 2020-12-08 DIAGNOSIS — Z20822 Contact with and (suspected) exposure to covid-19: Secondary | ICD-10-CM | POA: Diagnosis present

## 2020-12-08 DIAGNOSIS — F419 Anxiety disorder, unspecified: Secondary | ICD-10-CM

## 2020-12-08 DIAGNOSIS — R5383 Other fatigue: Secondary | ICD-10-CM | POA: Diagnosis not present

## 2020-12-08 DIAGNOSIS — I959 Hypotension, unspecified: Secondary | ICD-10-CM | POA: Diagnosis present

## 2020-12-08 DIAGNOSIS — I6930 Unspecified sequelae of cerebral infarction: Secondary | ICD-10-CM

## 2020-12-08 DIAGNOSIS — G9341 Metabolic encephalopathy: Secondary | ICD-10-CM | POA: Diagnosis present

## 2020-12-08 DIAGNOSIS — I69351 Hemiplegia and hemiparesis following cerebral infarction affecting right dominant side: Secondary | ICD-10-CM | POA: Diagnosis not present

## 2020-12-08 DIAGNOSIS — J9621 Acute and chronic respiratory failure with hypoxia: Secondary | ICD-10-CM | POA: Diagnosis present

## 2020-12-08 DIAGNOSIS — I272 Pulmonary hypertension, unspecified: Secondary | ICD-10-CM | POA: Diagnosis present

## 2020-12-08 DIAGNOSIS — F322 Major depressive disorder, single episode, severe without psychotic features: Secondary | ICD-10-CM | POA: Diagnosis present

## 2020-12-08 DIAGNOSIS — I4892 Unspecified atrial flutter: Secondary | ICD-10-CM | POA: Diagnosis not present

## 2020-12-08 DIAGNOSIS — I483 Typical atrial flutter: Secondary | ICD-10-CM | POA: Diagnosis not present

## 2020-12-08 DIAGNOSIS — R9431 Abnormal electrocardiogram [ECG] [EKG]: Secondary | ICD-10-CM | POA: Diagnosis not present

## 2020-12-08 DIAGNOSIS — E1142 Type 2 diabetes mellitus with diabetic polyneuropathy: Secondary | ICD-10-CM | POA: Diagnosis present

## 2020-12-08 DIAGNOSIS — I5022 Chronic systolic (congestive) heart failure: Secondary | ICD-10-CM | POA: Diagnosis present

## 2020-12-08 DIAGNOSIS — R778 Other specified abnormalities of plasma proteins: Secondary | ICD-10-CM | POA: Diagnosis not present

## 2020-12-08 DIAGNOSIS — D509 Iron deficiency anemia, unspecified: Secondary | ICD-10-CM | POA: Diagnosis present

## 2020-12-08 DIAGNOSIS — I639 Cerebral infarction, unspecified: Secondary | ICD-10-CM

## 2020-12-08 DIAGNOSIS — I130 Hypertensive heart and chronic kidney disease with heart failure and stage 1 through stage 4 chronic kidney disease, or unspecified chronic kidney disease: Secondary | ICD-10-CM | POA: Diagnosis present

## 2020-12-08 DIAGNOSIS — I69322 Dysarthria following cerebral infarction: Secondary | ICD-10-CM | POA: Diagnosis not present

## 2020-12-08 DIAGNOSIS — N179 Acute kidney failure, unspecified: Secondary | ICD-10-CM | POA: Diagnosis present

## 2020-12-08 DIAGNOSIS — I480 Paroxysmal atrial fibrillation: Secondary | ICD-10-CM | POA: Diagnosis present

## 2020-12-08 DIAGNOSIS — E785 Hyperlipidemia, unspecified: Secondary | ICD-10-CM

## 2020-12-08 DIAGNOSIS — I509 Heart failure, unspecified: Secondary | ICD-10-CM

## 2020-12-08 DIAGNOSIS — C61 Malignant neoplasm of prostate: Secondary | ICD-10-CM

## 2020-12-08 DIAGNOSIS — F32A Depression, unspecified: Secondary | ICD-10-CM

## 2020-12-08 LAB — URINALYSIS, COMPLETE (UACMP) WITH MICROSCOPIC
Bacteria, UA: NONE SEEN
Bilirubin Urine: NEGATIVE
Glucose, UA: >=500 mg/dL — AB
Ketones, ur: 5 mg/dL — AB
Leukocytes,Ua: NEGATIVE
Nitrite: NEGATIVE
Protein, ur: NEGATIVE mg/dL
Specific Gravity, Urine: 1.024 (ref 1.005–1.030)
WBC, UA: NONE SEEN WBC/hpf (ref 0–5)
pH: 5 (ref 5.0–8.0)

## 2020-12-08 LAB — BASIC METABOLIC PANEL
Anion gap: 11 (ref 5–15)
BUN: 24 mg/dL — ABNORMAL HIGH (ref 8–23)
CO2: 23 mmol/L (ref 22–32)
Calcium: 8.3 mg/dL — ABNORMAL LOW (ref 8.9–10.3)
Chloride: 107 mmol/L (ref 98–111)
Creatinine, Ser: 1.35 mg/dL — ABNORMAL HIGH (ref 0.61–1.24)
GFR, Estimated: 50 mL/min — ABNORMAL LOW (ref >60)
Glucose, Bld: 184 mg/dL — ABNORMAL HIGH (ref 70–99)
Potassium: 3.8 mmol/L (ref 3.5–5.1)
Sodium: 141 mmol/L (ref 135–145)

## 2020-12-08 LAB — CBC
HCT: 30.8 % — ABNORMAL LOW (ref 39.0–52.0)
Hemoglobin: 9.8 g/dL — ABNORMAL LOW (ref 13.0–17.0)
MCH: 30.9 pg (ref 26.0–34.0)
MCHC: 31.8 g/dL (ref 30.0–36.0)
MCV: 97.2 fL (ref 80.0–100.0)
Platelets: 183 10*3/uL (ref 150–400)
RBC: 3.17 MIL/uL — ABNORMAL LOW (ref 4.22–5.81)
RDW: 12.8 % (ref 11.5–15.5)
WBC: 7 10*3/uL (ref 4.0–10.5)
nRBC: 0 % (ref 0.0–0.2)

## 2020-12-08 LAB — GLUCOSE, CAPILLARY
Glucose-Capillary: 201 mg/dL — ABNORMAL HIGH (ref 70–99)
Glucose-Capillary: 181 mg/dL — ABNORMAL HIGH (ref 70–99)
Glucose-Capillary: 198 mg/dL — ABNORMAL HIGH (ref 70–99)
Glucose-Capillary: 179 mg/dL — ABNORMAL HIGH (ref 70–99)
Glucose-Capillary: 238 mg/dL — ABNORMAL HIGH (ref 70–99)

## 2020-12-08 LAB — DIGOXIN LEVEL: Digoxin Level: <0.2 ng/mL — ABNORMAL LOW (ref 0.8–2.0)

## 2020-12-08 LAB — BRAIN NATRIURETIC PEPTIDE: B Natriuretic Peptide: 699.5 pg/mL — ABNORMAL HIGH (ref 0.0–100.0)

## 2020-12-08 LAB — MAGNESIUM: Magnesium: 1.4 mg/dL — ABNORMAL LOW (ref 1.7–2.4)

## 2020-12-08 LAB — CBG MONITORING, ED: Glucose-Capillary: 157 mg/dL — ABNORMAL HIGH (ref 70–99)

## 2020-12-08 MED ORDER — POTASSIUM CHLORIDE CRYS ER 20 MEQ PO TBCR
20.0000 meq | EXTENDED_RELEASE_TABLET | Freq: Once | ORAL | Status: AC
  Administered 2020-12-08: 20 meq via ORAL
  Filled 2020-12-08: qty 1

## 2020-12-08 MED ORDER — AMIODARONE HCL IN DEXTROSE 360-4.14 MG/200ML-% IV SOLN
30.0000 mg/h | INTRAVENOUS | Status: DC
  Administered 2020-12-08 – 2020-12-11 (×7): 30 mg/h via INTRAVENOUS
  Filled 2020-12-08 (×6): qty 200

## 2020-12-08 MED ORDER — ATORVASTATIN CALCIUM 20 MG PO TABS
20.0000 mg | ORAL_TABLET | Freq: Every day | ORAL | Status: DC
  Administered 2020-12-08 – 2020-12-11 (×4): 20 mg via ORAL
  Filled 2020-12-08 (×4): qty 1

## 2020-12-08 MED ORDER — METOPROLOL TARTRATE 25 MG PO TABS
25.0000 mg | ORAL_TABLET | Freq: Two times a day (BID) | ORAL | Status: DC
  Administered 2020-12-09 – 2020-12-15 (×13): 25 mg via ORAL
  Filled 2020-12-08 (×13): qty 1

## 2020-12-08 MED ORDER — LATANOPROST 0.005 % OP SOLN
1.0000 [drp] | Freq: Every day | OPHTHALMIC | Status: DC
  Administered 2020-12-10 – 2020-12-16 (×5): 1 [drp] via OPHTHALMIC
  Filled 2020-12-08 (×3): qty 2.5

## 2020-12-08 MED ORDER — CARBIDOPA-LEVODOPA 25-100 MG PO TABS
1.0000 | ORAL_TABLET | Freq: Three times a day (TID) | ORAL | Status: DC
  Administered 2020-12-08 – 2020-12-11 (×11): 1 via ORAL
  Filled 2020-12-08 (×19): qty 1

## 2020-12-08 MED ORDER — FUROSEMIDE 10 MG/ML IJ SOLN
INTRAMUSCULAR | Status: AC
  Administered 2020-12-08: 20 mg via INTRAVENOUS
  Filled 2020-12-08: qty 2

## 2020-12-08 MED ORDER — AMIODARONE LOAD VIA INFUSION
150.0000 mg | Freq: Once | INTRAVENOUS | Status: AC
  Administered 2020-12-08: 150 mg via INTRAVENOUS
  Filled 2020-12-08: qty 83.34

## 2020-12-08 MED ORDER — METOPROLOL SUCCINATE ER 25 MG PO TB24
25.0000 mg | ORAL_TABLET | Freq: Every day | ORAL | Status: DC
  Administered 2020-12-08: 25 mg via ORAL
  Filled 2020-12-08: qty 1

## 2020-12-08 MED ORDER — FUROSEMIDE 10 MG/ML IJ SOLN: 20.0000 mg | INTRAMUSCULAR | Status: AC

## 2020-12-08 MED ORDER — AMIODARONE HCL IN DEXTROSE 360-4.14 MG/200ML-% IV SOLN
60.0000 mg/h | INTRAVENOUS | Status: DC
  Administered 2020-12-08: 60 mg/h via INTRAVENOUS
  Filled 2020-12-08 (×2): qty 200

## 2020-12-08 MED ORDER — IPRATROPIUM-ALBUTEROL 0.5-2.5 (3) MG/3ML IN SOLN
RESPIRATORY_TRACT | Status: AC
  Filled 2020-12-08: qty 3

## 2020-12-08 MED ORDER — METOPROLOL TARTRATE 5 MG/5ML IV SOLN
5.0000 mg | Freq: Once | INTRAVENOUS | Status: AC
  Administered 2020-12-08: 5 mg via INTRAVENOUS
  Filled 2020-12-08: qty 5

## 2020-12-08 MED ORDER — DILTIAZEM HCL 25 MG/5ML IV SOLN
INTRAVENOUS | Status: AC
  Filled 2020-12-08: qty 5

## 2020-12-08 MED ORDER — DIGOXIN 125 MCG PO TABS
0.1250 mg | ORAL_TABLET | ORAL | Status: DC
  Administered 2020-12-08: 0.125 mg via ORAL
  Filled 2020-12-08 (×3): qty 1

## 2020-12-08 MED ORDER — ALBUTEROL SULFATE (2.5 MG/3ML) 0.083% IN NEBU
2.5000 mg | INHALATION_SOLUTION | RESPIRATORY_TRACT | Status: DC | PRN
  Administered 2020-12-09: 2.5 mg via RESPIRATORY_TRACT
  Filled 2020-12-08: qty 3

## 2020-12-08 MED ORDER — METOPROLOL TARTRATE 5 MG/5ML IV SOLN
2.5000 mg | INTRAVENOUS | Status: AC | PRN
  Administered 2020-12-08 (×2): 2.5 mg via INTRAVENOUS
  Filled 2020-12-08 (×2): qty 5

## 2020-12-08 MED ORDER — DULOXETINE HCL 30 MG PO CPEP
30.0000 mg | ORAL_CAPSULE | Freq: Every day | ORAL | Status: DC
  Administered 2020-12-08 – 2020-12-16 (×9): 30 mg via ORAL
  Filled 2020-12-08 (×10): qty 1

## 2020-12-08 MED ORDER — FUROSEMIDE 10 MG/ML IJ SOLN
20.0000 mg | Freq: Two times a day (BID) | INTRAMUSCULAR | Status: DC
  Administered 2020-12-08 – 2020-12-09 (×2): 20 mg via INTRAVENOUS
  Filled 2020-12-08 (×2): qty 2

## 2020-12-08 MED ORDER — SODIUM CHLORIDE 0.9 % IV BOLUS
1000.0000 mL | Freq: Once | INTRAVENOUS | Status: AC
  Administered 2020-12-08: 1000 mL via INTRAVENOUS

## 2020-12-08 MED ORDER — MAGNESIUM OXIDE -MG SUPPLEMENT 400 (240 MG) MG PO TABS
400.0000 mg | ORAL_TABLET | Freq: Two times a day (BID) | ORAL | Status: DC
  Administered 2020-12-08 – 2020-12-16 (×16): 400 mg via ORAL
  Filled 2020-12-08 (×16): qty 1

## 2020-12-08 MED ORDER — MAGNESIUM SULFATE 2 GM/50ML IV SOLN
2.0000 g | Freq: Once | INTRAVENOUS | Status: AC
  Administered 2020-12-08: 2 g via INTRAVENOUS
  Filled 2020-12-08: qty 50

## 2020-12-08 MED ORDER — INSULIN GLARGINE 100 UNIT/ML ~~LOC~~ SOLN
6.0000 [IU] | Freq: Every day | SUBCUTANEOUS | Status: DC
  Administered 2020-12-08 – 2020-12-10 (×3): 6 [IU] via SUBCUTANEOUS
  Filled 2020-12-08 (×5): qty 0.06

## 2020-12-08 NOTE — Progress Notes (Signed)
Patient ID: Todd [name], male   DOB: 1930/10/01, 87 y.o.   MRN: [id number] [facility] PROGRESS NOTE  Todd [name] DGL:[id number] DOB: 1930/08/14 DOA: 12/07/2020 PCP: Park Liter P, DO  HPI/Subjective: Patient feeling better now.  Was brought in for not feeling well and sleeping a lot and not eating very much.  Patient was found to be in rapid atrial fibrillation and blood pressure went low after IV medications and was placed on amiodarone drip.  Patient unable to talk secondary to prior stroke.  Objective: Vitals:   12/08/20 1013 12/08/20 1208  BP: (!) 153/71 125/74  Pulse: (!) 58 (!) 118  Resp:    Temp: 98.1 F (36.7 C) 98.9 F (37.2 C)  SpO2: 98% 100%    Intake/Output Summary (Last 24 hours) at 12/08/2020 1257 Last data filed at 12/07/2020 1858 Gross per 24 hour  Intake 500 ml  Output --  Net 500 ml   Filed Weights   12/07/20 1705  Weight: 77.1 kg    ROS: Review of Systems  Unable to perform ROS: Acuity of condition  Exam: Physical Exam HENT:     Head: Normocephalic.     Mouth/Throat:     Pharynx: No oropharyngeal exudate.  Eyes:     General: Lids are normal.     Conjunctiva/sclera: Conjunctivae normal.     Pupils: Pupils are equal, round, and reactive to light.  Cardiovascular:     Rate and Rhythm: Tachycardia present. Rhythm irregularly irregular.     Heart sounds: Normal heart sounds, S1 normal and S2 normal.  Pulmonary:     Breath sounds: No decreased breath sounds, wheezing, rhonchi or rales.  Abdominal:     Palpations: Abdomen is soft.     Tenderness: There is no abdominal tenderness.  Musculoskeletal:     Right lower leg: No swelling.     Left lower leg: No swelling.  Skin:    General: Skin is warm.     Findings: No rash.  Neurological:     Mental Status: He is alert and oriented to person, place, and time.     Comments: Able to straight leg raise bilaterally.     Data Reviewed: Basic Metabolic Panel: Recent Labs  Lab 12/07/20 1719  12/08/20 0657  NA 139 141  K 4.1 3.8  CL 103 107  CO2 29 23  GLUCOSE 202* 184*  BUN 24* 24*  CREATININE 1.40* 1.35*  CALCIUM 8.5* 8.3*  MG  --  1.4*   Liver Function Tests: Recent Labs  Lab 12/07/20 1719  AST 20  ALT <5  ALKPHOS 52  BILITOT 0.6  PROT 6.7  ALBUMIN 3.7   CBC: Recent Labs  Lab 12/07/20 1719 12/08/20 0657  WBC 7.1 7.0  NEUTROABS 4.4  --   HGB 9.9* 9.8*  HCT 31.3* 30.8*  MCV 99.1 97.2  PLT 199 183     CBG: Recent Labs  Lab 12/07/20 1622 12/08/20 0814 12/08/20 1024 12/08/20 1209  GLUCAP 205* 157* 201* 181*    Recent Results (from the past 240 hour(s))  Resp Panel by RT-PCR (Flu A&B, Covid) Nasopharyngeal Swab     Status: None   Collection Time: 12/07/20  8:40 PM   Specimen: Nasopharyngeal Swab; Nasopharyngeal(NP) swabs in vial transport medium  Result Value Ref Range Status   SARS Coronavirus 2 by RT PCR NEGATIVE NEGATIVE Final    Comment: (NOTE) SARS-CoV-2 target nucleic acids are NOT DETECTED.  The SARS-CoV-2 RNA is generally detectable in upper respiratory specimens during the  acute phase of infection. The lowest concentration of SARS-CoV-2 viral copies this assay can detect is 138 copies/mL. A negative result does not preclude SARS-Cov-2 infection and should not be used as the sole basis for treatment or other patient management decisions. A negative result may occur with  improper specimen collection/handling, submission of specimen other than nasopharyngeal swab, presence of viral mutation(s) within the areas targeted by this assay, and inadequate number of viral copies(<138 copies/mL). A negative result must be combined with clinical observations, patient history, and epidemiological information. The expected result is Negative.  Fact Sheet for Patients:  EntrepreneurPulse.com.au  Fact Sheet for Healthcare Providers:  IncredibleEmployment.be  This test is no t yet approved or cleared by the  [place] FDA and  has been authorized for detection and/or diagnosis of SARS-CoV-2 by FDA under an Emergency Use Authorization (EUA). This EUA will remain  in effect (meaning this test can be used) for the duration of the COVID-19 declaration under Section 564(b)(1) of the Act, 21 U.S.C.section 360bbb-3(b)(1), unless the authorization is terminated  or revoked sooner.       Influenza A by PCR NEGATIVE NEGATIVE Final   Influenza B by PCR NEGATIVE NEGATIVE Final    Comment: (NOTE) The Xpert Xpress SARS-CoV-2/FLU/RSV plus assay is intended as an aid in the diagnosis of influenza from Nasopharyngeal swab specimens and should not be used as a sole basis for treatment. Nasal washings and aspirates are unacceptable for Xpert Xpress SARS-CoV-2/FLU/RSV testing.  Fact Sheet for Patients: EntrepreneurPulse.com.au  Fact Sheet for Healthcare Providers: IncredibleEmployment.be  This test is not yet approved or cleared by the [place] FDA and has been authorized for detection and/or diagnosis of SARS-CoV-2 by FDA under an Emergency Use Authorization (EUA). This EUA will remain in effect (meaning this test can be used) for the duration of the COVID-19 declaration under Section 564(b)(1) of the Act, 21 U.S.C. section 360bbb-3(b)(1), unless the authorization is terminated or revoked.  Performed at [facility], [address] [place], [place] [address]      Studies: CT ABDOMEN PELVIS WO CONTRAST  Result Date: 12/07/2020 CLINICAL DATA:  Abdomen pain poor appetite EXAM: CT ABDOMEN AND PELVIS WITHOUT CONTRAST TECHNIQUE: Multidetector CT imaging of the abdomen and pelvis was performed following the standard protocol without IV contrast. COMPARISON:  CT 11/11/2018 FINDINGS: Lower chest: Lung bases demonstrate no acute consolidation or effusion. Cardiomegaly. Coronary vascular calcification. Hepatobiliary: No focal hepatic abnormality. Small  calcified gallstones. No biliary dilatation Pancreas: Unremarkable. No pancreatic ductal dilatation or surrounding inflammatory changes. Spleen: Normal in size without focal abnormality. Adrenals/Urinary Tract: Adrenal glands are within normal limits. Kidneys show no hydronephrosis. Low-density lesions in both kidneys likely cysts. Punctate stone lower pole right kidney. The bladder is unremarkable. Stomach/Bowel: The stomach is nonenlarged. No dilated small bowel. No acute bowel wall thickening. Nonvisualized appendix. Vascular/Lymphatic: Moderate aortic atherosclerosis. No aneurysm. No suspicious nodes. Reproductive: Post treatment changes of the prostate. Other: Negative for pelvic effusion or free air Musculoskeletal: Scoliosis and advanced degenerative changes throughout the spine IMPRESSION: 1. No CT evidence for acute intra-abdominal or pelvic abnormality. 2. Gallstones 3. Punctate nonobstructing right kidney stone 4. Cardiomegaly Electronically Signed   By: Donavan Foil M.D.   On: 12/07/2020 21:53   DG Chest Port 1 View  Result Date: 12/07/2020 CLINICAL DATA:  Weakness. EXAM: PORTABLE CHEST 1 VIEW COMPARISON:  Chest x-ray dated June 10, 2020. FINDINGS: Unchanged mild cardiomegaly. Persistent low lung volumes. No focal consolidation, pleural effusion, or pneumothorax. Unchanged mild  elevation of the right hemidiaphragm. No acute osseous abnormality. IMPRESSION: No active disease. Electronically Signed   By: Titus Dubin M.D.   On: 12/07/2020 18:23    Scheduled Meds:  apixaban  5 mg Oral BID   atorvastatin  20 mg Oral Daily   carbidopa-levodopa  1 tablet Oral TID   digoxin  0.125 mg Oral QODAY   DULoxetine  30 mg Oral Daily   furosemide  20 mg Intravenous BID   insulin aspart  0-9 Units Subcutaneous TID WC   magnesium oxide  400 mg Oral BID   [START ON 12/09/2020] metoprolol tartrate  25 mg Oral BID   potassium chloride  20 mEq Oral Once   sodium chloride flush  3 mL Intravenous Q12H    Continuous Infusions:  sodium chloride Stopped (12/07/20 1939)   amiodarone 30 mg/hr (12/08/20 0740)    Assessment/Plan:  Atrial fibrillation with rapid ventricular response (paroxysmal in nature).  Was placed on amiodarone drip last night.  With blood pressure being a little higher restarted beta-blocker.  Looks like also placed on every other day digoxin.  Patient on Eliquis for anticoagulation Hypomagnesemia replace IV magnesium History of ischemic stroke with baseline right-sided weakness.  Physical therapy evaluation Type 2 diabetes mellitus with hyperlipidemia.  Continue atorvastatin.  Holding Jardiance and metformin while here in the hospital.  We will give low-dose Lantus insulin. Lactic acidosis hold Glucophage.  Patient received IV fluids on presentation Anemia of chronic disease Chronic kidney disease stage III OA Depression anxiety on Cymbalta      Code Status:     Code Status Orders  (From admission, onward)           Start     Ordered   12/07/20 2334  Full code  Continuous        12/07/20 2335           Code Status History     Date Active Date Inactive Code Status Order ID Comments User Context   11/11/2018 1817 11/17/2018 1950 Full Code [id number]  Gorden Harms, MD Inpatient   04/20/2018 2122 04/23/2018 1849 Full Code [id number]  Gladstone Lighter, MD Inpatient   05/18/2017 0303 05/21/2017 1928 Full Code [id number]  Lance Coon, MD Inpatient   03/17/2017 0419 03/17/2017 2109 Full Code [id number]  Saundra Shelling, MD ED   03/05/2015 0019 03/05/2015 1507 Full Code [id number]  Lance Coon, MD Inpatient      Family Communication: Spoke with daughter at the bedside Disposition Plan: Status is: Inpatient  Dispo: The patient is from: Home              Anticipated d/c is to: Home              Patient currently on amiodarone drip to control heart rate   Difficult to place patient.  No.  Consultants: Cardiology  Time spent: 27 minutes  [place]

## 2020-12-08 NOTE — Evaluation (Addendum)
Physical Therapy Evaluation Patient Details Name: Todd [name] MRN: [id number] DOB: 1931-03-06 Today's Date: 12/08/2020   History of Present Illness  Todd [name] is an 83yoM who comes to [facility] on 09/07/20 c fatigue, weakness, anorrexia. 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 PMH: PAF on eliquis and digoxin, CAD, CVA c Rt hemiplegia, CKD3a, DM2, HTN, HLD, chronic anemia,  Clinical Impression  Pt admitted with above diagnosis. Pt currently with functional limitations due to the deficits listed below (see "PT Problem List"). Upon entry, pt in bed, awake and agreeable to participate. The pt is alert, pleasant, interactive, but unable to provide info regarding prior level of function due to dementia+HOH+brocha's aphasia. MinA to EOB, maxA to stand, then able to standing with minA from elevated surface. Pt shows fluent and safe technique for STS transfers, appropriate use of RW. Standing tolerance is far worse today than baseline, hence AMB assessment is not attempted. Patient's performance this date reveals decreased ability, independence, and tolerance in performing all basic mobility required for performance of activities of daily living. Pt requires additional DME, close physical assistance, and cues for safe participate in mobility. Pt is off baseline, but only somewhat, however recommend use of transport chair for mobility in home at DC until recovery of ambulatory status can be obtained. Pt will benefit from skilled PT intervention to increase independence and safety with basic mobility in preparation for discharge to the venue listed below.   Pt in AF RVR throughout visit, rates on tele variable from 105-125, however, more often on the low end. No exacerbation of rate is seen with activity to EOB, nor with repeat standing efforts.      Follow Up Recommendations Home health PT;Supervision for mobility/OOB    Equipment Recommendations  Other (comment) (tranport chair to assist with in-home  mobility.)    Recommendations for Other Services       Precautions / Restrictions Precautions Precautions: Fall Restrictions Weight Bearing Restrictions: No      Mobility  Bed Mobility Overal bed mobility: Needs Assistance Bed Mobility: Supine to Sit;Sit to Supine     Supine to sit: Min assist Sit to supine: Min guard   General bed mobility comments: frequent cuing, appears to be having knee pain which is limiting and is also demotivated by presence of condom cath    Transfers Overall transfer level: Needs assistance   Transfers: Sit to/from Stand Sit to Stand: Max assist;+2 physical assistance         General transfer comment: is able to rise with minA from very elevated surface and RW, but tolerates on ~20sec standing prior to need to return to sitting, appears to be limited by knee pain . (uses a lift chair to get up at home)  Ambulation/Gait Ambulation/Gait assistance:  (deferred due to poor stnading time tolerance)              Stairs            Wheelchair Mobility    Modified Rankin (Stroke Patients Only)       Balance Overall balance assessment: Needs assistance                                           Pertinent Vitals/Pain Pain Assessment: No/denies pain    Home Living Family/patient expects to be discharged to:: Private residence Living Arrangements: Children (Son gerald and Son's wife) Available Help  at Discharge: Available 24 hours/day Type of Home: House Home Access: Ramped entrance     Home Layout: One level Home Equipment: Wheelchair - [employer] - 2 wheels;Walker - 4 wheels      Prior Function Level of Independence: Needs assistance   Gait / Transfers Assistance Needed: limited household distance AMB c RW;  ADL's / Homemaking Assistance Needed: Able to perform bed mobility at ight ad lib to use a urinal; for weakness/safety requires assit with transfers.        Hand Dominance         Extremity/Trunk Assessment                Communication      Cognition Arousal/Alertness: Awake/alert Behavior During Therapy: WFL for tasks assessed/performed Overall Cognitive Status: Within Functional Limits for tasks assessed                                        General Comments      Exercises Other Exercises Other Exercises: STS from elevated EOB c MinA, RW, sustained standing to failure (~15-20sec) 5x   Assessment/Plan    PT Assessment Patient needs continued PT services  PT Problem List Decreased strength;Decreased range of motion;Decreased activity tolerance;Decreased balance;Decreased mobility;Decreased cognition;Decreased knowledge of use of DME;Decreased safety awareness;Decreased knowledge of precautions;Cardiopulmonary status limiting activity       PT Treatment Interventions DME instruction;Balance training;Gait training;Neuromuscular re-education;Stair training;Functional mobility training;Therapeutic activities;Therapeutic exercise;Patient/family education    PT Goals (Current goals can be found in the Care Plan section)  Acute Rehab PT Goals Patient Stated Goal: avoid decline while admitted PT Goal Formulation: With patient Time For Goal Achievement: 12/22/20 Potential to Achieve Goals: Fair    Frequency Min 2X/week   Barriers to discharge        Co-evaluation               AM-PAC PT "6 Clicks" Mobility  Outcome Measure Help needed turning from your back to your side while in a flat bed without using bedrails?: A Little Help needed moving from lying on your back to sitting on the side of a flat bed without using bedrails?: A Little Help needed moving to and from a bed to a chair (including a wheelchair)?: Total Help needed standing up from a chair using your arms (e.g., wheelchair or bedside chair)?: Total Help needed to walk in hospital room?: Total Help needed climbing 3-5 steps with a railing? : Total 6 Click Score:  10    End of Session Equipment Utilized During Treatment: Oxygen Activity Tolerance: Patient tolerated treatment well;Patient limited by fatigue;Patient limited by pain Patient left: in bed;with family/visitor present;with call bell/phone within reach;with bed alarm set Nurse Communication: Mobility status PT Visit Diagnosis: Difficulty in walking, not elsewhere classified (R26.2);Other abnormalities of gait and mobility (R26.89);Muscle weakness (generalized) (M62.81)    Time: 1300-1340 PT Time Calculation (min) (ACUTE ONLY): 40 min   Charges:   PT Evaluation $PT Eval High Complexity: 1 High PT Treatments $Therapeutic Activity: 8-22 mins       2:03 PM, 12/08/20 Etta Grandchild, PT, DPT Physical Therapist - [facility]  [phone] ([place])    [place] C 12/08/2020, 2:00 PM

## 2020-12-08 NOTE — Progress Notes (Signed)
Patient arrived to unit safely and without distress. Daughter at bedside. Patient continues on amio gtt at this time. Vitals WDL. Patient unable to express needs for ADLs.

## 2020-12-08 NOTE — Progress Notes (Signed)
   12/08/20 1208  Assess: MEWS Score  Temp 98.9 F (37.2 C)  BP 125/74  Pulse Rate (!) 118  SpO2 100 %  O2 Device Nasal Cannula  Assess: MEWS Score  MEWS Temp 0  MEWS Systolic 0  MEWS Pulse 2  MEWS RR 0  MEWS LOC 0  MEWS Score 2  MEWS Score Color Yellow  Assess: if the MEWS score is Yellow or Red  Were vital signs taken at a resting state? Yes  Focused Assessment No change from prior assessment  Does the patient meet 2 or more of the SIRS criteria? No  Does the patient have a confirmed or suspected source of infection? No  MEWS guidelines implemented *See Row Information* No, previously yellow, continue vital signs every 4 hours  Treat  MEWS Interventions Escalated (See documentation below)  Document  Progress note created (see row info) Yes  Assess: SIRS CRITERIA  SIRS Temperature  0  SIRS Pulse 1  SIRS Respirations  0  SIRS WBC 0  SIRS Score Sum  1

## 2020-12-08 NOTE — Consult Note (Addendum)
Cardiology Consultation:   Patient ID: Todd [name] MRN: [id number]; DOB: Mar 02, 1931  Admit date: 12/07/2020 Date of Consult: 12/08/2020  PCP:  Valerie Roys, DO   [place]  Cardiologist:  Ida Rogue, MD  Advanced Practice Provider:  No care team member to display Electrophysiologist:  None       Patient Profile:   Todd [name] is a 87 y.o. 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 Leslye Peer.  History of Present Illness:   Mr. Spinella is an 87yo male with PMH above. He has a history of CVA and CAD by cath approximately 8 to 10 years earlier.  In addition he has a history of aortic valve insufficiency.  He was admitted to the hospital 05/2017 with CVA and MRI brain showed acute to subacute left pontine infarct, as well as chronic left bundle lacunar infarct.  Echo showed EF 55 to 60%, G1 DD, trivial AI, mild LAE.  Carotid artery ultrasound showed 1 to 39% bilateral ICA stenosis.  He is managed on ASA and Plavix per neurology.    At past office visits, most history is obtained from his daughters, as he is mostly aphasic 2/2 stroke.  At previous visits, they had indicated he had been suffering from profound fatigue.  He has had rate well controlled in atrial fibrillation/flutter.  Per family request, he was transitioned from Toprol back to extended release propranolol 120 mg daily in an effort to help his tremor.  Cardioversion declined.  Eliquis continued. He does have a history of falls with ongoing chronic weakness of his lower extremities was reported with patient in a wheelchair.   11/2019 LVEF 50-65%, moderate LVH, moderate LAE, mild MR, ascending Ao 48m.   Last seen in clinic  03/15/20 by primary cardiologist. He reported chronic SOB, worse with exertion. He was taking lasix 352mdaily. No recent falls. No s/sx of overload. EKG showed SR. No changes made.   [facility] feeling weak with decreased oral intake.  History obtained via telephone call made to another daughter that is the primary caretaker for the patient.  She reports that she has noticed significant fatigue over the last few days.  No chest pain.  She did notice DOE and that he would often tap his chest when returning from the bathroom or exerting himself, as if to say that he was out of breath. No SOB at rest on O2 that she could tell.  Decreased oral intake was noted, as well as associated weakness. They had not adjusted his home oxygen.   She noted some ankle swelling but denied other sx of overload.  She confirmed medication compliance and denied any missed doses of Eliquis.  No signs or symptoms of bleeding. No recent falls. Given these sx, they took him urgent care, [facility]. In the ED, he was hypotensive. Labs showed magnesium 1.4, creatinine 1.35 with baseline creatinine 1.2-1.3, BUN 24, blood cultures pending, anemia with stable H&H.  CT abdomen and pelvis showed gallstones and punctate nonobstructing right kidney stone, cardiomegaly, and no acute intra-abdominal or pelvic abnormality.  Chest x-ray with unchanged mild cardiomegaly and persistently low lung  volumes.  He also had unchanged mild elevation of the right hemidiaphragm. UA with glucose and moderate hemoglobin, ketones 5.  Cardiology consulted for atrial fibrillation with RVR.  Past Medical History:  Diagnosis Date   Anxiety    Aortic regurgitation    a. 03/2017 Echo: Mod AI; b. 05/2017 Echo: Triv AI.   Arthritis    osteoarthritis-left knee   CAD (coronary artery disease)    a. Reported h/o cath ~ 2008 - ? small vessel dzs. No PCI performed; b. 03/2017 MV: EF 45-54%, no ischemia/infarct.    Depression    Diabetes mellitus without complication ([place])    Diastolic dysfunction    a. 03/2017 Echo: EF 55-65%; b. 05/2017 Echo: EF 55-60%, no rwma, Gr1 DD. Triv AI.  Mildly dil LA.   ED (erectile dysfunction)    Elevated troponin    a. chronic mild elevation - 0.03->0.04.   GERD (gastroesophageal reflux disease)    Glaucoma    Hyperlipidemia    Hypertension    Iron deficiency anemia due to chronic blood loss    Left pontine stroke w/ cerebrovascular disease(HCC)    a. 05/2017 MRI/A: acute to subacute L pontine infarct. Chronic left thalamic lacunar infarct. Severe basilar artery stenosis w/ radiographic string sign of the distal 1/3. Mild to moderate L PCA P2 segment stenosis; b. 05/2017 Carotid U/S: <50% bilat ICA stenosis.   Lumbago    Lumbosacral Neuritis   Prostate cancer [facility])     Past Surgical History:  Procedure Laterality Date   CARPAL TUNNEL RELEASE     REPLACEMENT TOTAL KNEE BILATERAL       Home Medications:  Prior to Admission medications   Medication Sig Start Date End Date Taking? Authorizing Provider  acetaminophen (TYLENOL) 650 MG CR tablet Take 650 mg by mouth every 8 (eight) hours as needed for pain.   Yes [provider]  apixaban (ELIQUIS) 5 MG TABS tablet Take 1 tablet (5 mg total) by mouth 2 (two) times daily. 11/29/20  Yes Johnson, Megan P, DO  aspirin EC 81 MG tablet Take 81 mg by mouth daily. Swallow whole.   Yes [provider]  atorvastatin (LIPITOR) 20 MG tablet Take 1 tablet (20 mg total) by mouth daily. 11/29/20  Yes Johnson, Megan P, DO  carbidopa-levodopa (SINEMET IR) 25-100 MG tablet Take 1 tablet by mouth 3 (three) times daily. 09/22/20  Yes [provider]  digoxin (LANOXIN) 0.125 MG tablet Take 1 tablet (0.125 mg total) by mouth every other day. 11/05/20  Yes Gollan, Kathlene November, MD  docusate sodium (COLACE) 100 MG capsule Take 1 capsule (100 mg total) by mouth 2 (two) times daily as needed for mild constipation. 11/29/20   Yes Johnson, Megan P, DO  DULoxetine (CYMBALTA) 30 MG capsule Take 1 capsule (30 mg total) by mouth daily. 11/29/20  Yes Johnson, Megan P, DO  empagliflozin (JARDIANCE) 25 MG TABS tablet Take 1 tablet (25 mg total) by mouth daily before breakfast. 11/29/20  Yes Johnson, Megan P, DO  furosemide (LASIX) 20 MG tablet Take 1.5 tablets (30 mg) by mouth once daily 07/12/20  Yes Terris Bodin D, PA-C  gabapentin (NEURONTIN) 100 MG capsule Take 1 capsule (100 mg total) by mouth at bedtime. 11/29/20  Yes Johnson, Megan P, DO  hydrALAZINE (APRESOLINE) 50 MG tablet Take 1 tablet (50 mg total) by mouth 3 (three) times daily. 11/29/20  Yes Johnson, Megan P, DO  latanoprost (XALATAN) 0.005 % ophthalmic solution Place 1 drop into both eyes at  bedtime.  06/08/16  Yes [provider]  Magnesium Oxide 400 (240 Mg) MG TABS Take 1 tablet (400 mg total) by mouth 2 (two) times daily. 07/01/19  Yes Dunn, Areta Haber, PA-C  metFORMIN (GLUCOPHAGE) 500 MG tablet Take 1 tablet (500 mg total) by mouth 2 (two) times daily with a meal. 11/29/20  Yes Johnson, Megan P, DO  naproxen sodium (ALEVE) 220 MG tablet Take 220 mg by mouth 2 (two) times daily as needed.    Yes [provider]  pantoprazole (PROTONIX) 40 MG tablet Take 1 tablet (40 mg total) by mouth daily. 11/29/20  Yes Johnson, Megan P, DO  Polyethylene Glycol 3350 (MIRALAX PO) Take 17 g by mouth as needed.   Yes [provider]  propranolol ER (INDERAL LA) 120 MG 24 hr capsule Take 1 capsule (120 mg total) by mouth daily. 03/22/20  Yes Gollan, Kathlene November, MD  tamsulosin (FLOMAX) 0.4 MG CAPS capsule Take 1 capsule (0.4 mg total) by mouth daily. 05/31/20  Yes Johnson, Megan P, DO  blood glucose meter kit and supplies KIT Dispense based on patient and insurance preference. Use up to four times daily as directed. (FOR ICD-9 250.00, 250.01). 11/17/18   Dustin Flock, MD  Blood Glucose Monitoring Suppl (ACCU-CHEK AVIVA PLUS) w/Device KIT Use to check sugar levels  x 2 daily 11/17/18   Dustin Flock, MD  Lancets (ACCU-CHEK SOFT [facility]) lancets Use as instructed 11/17/18   Dustin Flock, MD  [facility] ULTRA test strip USE TO CHECK SUGAR LEVELS TWICE DAILY. 11/18/18   Guadalupe Maple, MD    Inpatient Medications: Scheduled Meds:  apixaban  5 mg Oral BID   atorvastatin  20 mg Oral Daily   carbidopa-levodopa  1 tablet Oral TID   digoxin  0.125 mg Oral QODAY   DULoxetine  30 mg Oral Daily   insulin aspart  0-9 Units Subcutaneous TID WC   metoprolol succinate  25 mg Oral Daily   sodium chloride flush  3 mL Intravenous Q12H   Continuous Infusions:  sodium chloride Stopped (12/07/20 1939)   amiodarone 30 mg/hr (12/08/20 0740)   PRN Meds: acetaminophen **OR** acetaminophen, metoprolol tartrate, ondansetron **OR** ondansetron (ZOFRAN) IV  Allergies:    Allergies  Allergen Reactions   Amlodipine     Lower extremity swelling at 10 mg.   Fluoxetine Other (See Comments)    headache   Meloxicam Other (See Comments)    Other reaction(s): Dizziness Nervousness.     Social History:   Social History   Socioeconomic History   Marital status: Widowed    Spouse name: Not on file   Number of children: Not on file   Years of education: Not on file   Highest education level: Not on file  Occupational History   Occupation: retired  Tobacco Use   Smoking status: Former    Pack years: 0.00    Types: Cigarettes    Quit date: 12/02/1994    Years since quitting: 26.0   Smokeless tobacco: Never  Vaping Use   Vaping Use: Never used  Substance and Sexual Activity   Alcohol use: Yes    Alcohol/week: 1.0 standard drink    Types: 1 Cans of beer per week    Comment: occasional   Drug use: No   Sexual activity: Not Currently  Other Topics Concern   Not on file  Social History Narrative   Lives at home with family.  Able to ambulate with a walker.   Social Determinants of  Health   Financial Resource Strain: Low Risk    Difficulty of Paying Living  Expenses: Not hard at all  Food Insecurity: No Food Insecurity   Worried About [place] in the Last Year: Never true   Ran Out of Food in the Last Year: Never true  Transportation Needs: No Transportation Needs   Lack of Transportation (Medical): No   Lack of Transportation (Non-Medical): No  Physical Activity: Inactive   Days of Exercise per Week: 0 days   Minutes of Exercise per Session: 0 min  Stress: No Stress Concern Present   Feeling of Stress : Not at all  Social Connections: Not on file  Intimate Partner Violence: Not on file    Family History:    Family History  Problem Relation Age of Onset   Stroke Mother    Hypertension Mother    Hypertension Father    Stroke Sister    Prostate cancer Neg Hx    Kidney cancer Neg Hx    Bladder Cancer Neg Hx      ROS:  Please see the history of present illness.  ROS obtained via conversation with daughter.  She reports pt having fatigue, weakness, decreased oral intake, and dyspnea.She denies pt having chest pain, palpitations, pnd, orthopnea, n, v, dizziness, syncope.  All other ROS reviewed and negative.     Physical Exam/Data:   Vitals:   12/08/20 0800 12/08/20 0815 12/08/20 0900 12/08/20 1013  BP: 125/69   (!) 153/71  Pulse:  93  (!) 58  Resp: 15     Temp:   98 F (36.7 C) 98.1 F (36.7 C)  TempSrc:   Oral Oral  SpO2: 98%   98%  Weight:      Height:        Intake/Output Summary (Last 24 hours) at 12/08/2020 1053 Last data filed at 12/07/2020 1858 Gross per 24 hour  Intake 500 ml  Output --  Net 500 ml   Last 3 Weights 12/07/2020 10/25/2020 08/30/2020  Weight (lbs) 170 lb 173 lb 173 lb  Weight (kg) 77.111 kg 78.472 kg 78.472 kg     Body mass index is 25.85 kg/m.  General: Frail, elderly male, no acute distress, lying in bed.  Nonverbal.  Joined by his daughter. HEENT: normal.  Nasal cannula oxygen in place. Lymph: no adenopathy Neck: JVP difficult to assess given unable to position pt properly to  view Vascular: radial pulses 2+ bilaterally  Cardiac:  normal S1, S2; IRIR; 2/6 systolic murmur Lungs: Bibasilar crackles via anterior auscultation only, as patient is unable to follow instruction Abd: soft, nontender, no hepatomegaly  Ext: 1+ bilateral lower extremity edema with chronic LLE>RLE edema Musculoskeletal:  No deformities Skin: warm and dry  Neuro:   no focal abnormalities noted Psych:  Normal affect, nonverbal 2/2 CVA  EKG:  The EKG was personally reviewed and demonstrates:  Atrial fibrillation, ventricular rate 143 bpm, IVCD with left axis deviation, poor R wave progression in lateral, inferior, and septal anterior leads Telemetry:  Telemetry was personally reviewed and demonstrates:  atrial fibrillation, current ventricular rates 90s-low 100s. Earlier, elevation of ventricular rates 120s to 130s  Relevant CV Studies: Echo 11/2019  1. The left ventricle has mild-moderately reduced systolic function, with  an ejection fraction of 40-45%. The cavity size was normal. There is  mildly increased left ventricular wall thickness. Left ventricular  diastolic function could not be evaluated.   2. LVEF may be underestimated due to atrial flutter with  variable AV  conduction.   3. The right ventricle has moderately reduced systolic function. The  cavity was normal. There is no increase in right ventricular wall  thickness. Right ventricular systolic pressure is moderately elevated with  an estimated pressure of 54.9 mmHg.   4. Left atrial size was mildly dilated.   5. Right atrial size was mildly dilated.   6. The pericardial effusion is posterior to the left ventricle.   7. Trivial pericardial effusion is present.   8. The mitral valve is degenerative. There is mild mitral annular  calcification present.   9. Tricuspid valve regurgitation is mild-moderate.  10. The aortic valve is tricuspid. Mild thickening of the aortic valve.  Moderate calcification of the aortic valve. Aortic  valve regurgitation is  trivial by color flow Doppler.  11. There is mild dilatation of the aortic root measuring 38 mm.  12. The inferior vena cava was dilated in size with <50% respiratory  variability.   Echocardiogram 11/12/2018  1. The left ventricle has mild-moderately reduced systolic function, with  an ejection fraction of 40-45%. The cavity size was normal. There is  mildly increased left ventricular wall thickness. Left ventricular  diastolic function could not be evaluated.   2. LVEF may be underestimated due to atrial flutter with variable AV  conduction.   3. The right ventricle has moderately reduced systolic function. The  cavity was normal. There is no increase in right ventricular wall  thickness. Right ventricular systolic pressure is moderately elevated with  an estimated pressure of 54.9 mmHg.   4. Left atrial size was mildly dilated.   5. Right atrial size was mildly dilated.   6. The pericardial effusion is posterior to the left ventricle.   7. Trivial pericardial effusion is present.   8. The mitral valve is degenerative. There is mild mitral annular  calcification present.   9. Tricuspid valve regurgitation is mild-moderate.  10. The aortic valve is tricuspid. Mild thickening of the aortic valve.  Moderate calcification of the aortic valve. Aortic valve regurgitation is  trivial by color flow Doppler.  11. There is mild dilatation of the aortic root measuring 38 mm.  12. The inferior vena cava was dilated in size with <50% respiratory  variability.   ZIO monitor 05/2018 Normal sinus rhythm Avg HR of 68 bpm.   1639 atrial tachycardia/Supraventricular Tachycardia runs occurred, The run with the fastest interval lasting 5 beats with a max rate of 179 bpm, the longest lasting 13 beats with an avg rate of 104 bpm.   Isolated SVEs were occasional (2.5%, 9549), SVE Couplets were occasional (2.2%, 4164), and SVE Triplets were rare (<1.0%, 613). Isolated VEs  were frequent (8.2%, 31242), VE Couplets were rare (<1.0%, 154), and no VE Triplets were present. Ventricular Bigeminy and Trigeminy were present.  Laboratory Data:  High Sensitivity Troponin:   Recent Labs  Lab 12/07/20 1719 12/07/20 1955  TROPONINIHS 95* 96*     Chemistry Recent Labs  Lab 12/07/20 1719 12/08/20 0657  NA 139 141  K 4.1 3.8  CL 103 107  CO2 29 23  GLUCOSE 202* 184*  BUN 24* 24*  CREATININE 1.40* 1.35*  CALCIUM 8.5* 8.3*  GFRNONAA 48* 50*  ANIONGAP 7 11    Recent Labs  Lab 12/07/20 1719  PROT 6.7  ALBUMIN 3.7  AST 20  ALT <5  ALKPHOS 52  BILITOT 0.6   Hematology Recent Labs  Lab 12/07/20 1719 12/08/20 0657  WBC 7.1 7.0  RBC  3.16* 3.17*  HGB 9.9* 9.8*  HCT 31.3* 30.8*  MCV 99.1 97.2  MCH 31.3 30.9  MCHC 31.6 31.8  RDW 12.8 12.8  PLT 199 183   BNPNo results for input(s): BNP, PROBNP in the last 168 hours.  DDimer No results for input(s): DDIMER in the last 168 hours.   Radiology/Studies:  CT ABDOMEN PELVIS WO CONTRAST  Result Date: 12/07/2020 CLINICAL DATA:  Abdomen pain poor appetite EXAM: CT ABDOMEN AND PELVIS WITHOUT CONTRAST TECHNIQUE: Multidetector CT imaging of the abdomen and pelvis was performed following the standard protocol without IV contrast. COMPARISON:  CT 11/11/2018 FINDINGS: Lower chest: Lung bases demonstrate no acute consolidation or effusion. Cardiomegaly. Coronary vascular calcification. Hepatobiliary: No focal hepatic abnormality. Small calcified gallstones. No biliary dilatation Pancreas: Unremarkable. No pancreatic ductal dilatation or surrounding inflammatory changes. Spleen: Normal in size without focal abnormality. Adrenals/Urinary Tract: Adrenal glands are within normal limits. Kidneys show no hydronephrosis. Low-density lesions in both kidneys likely cysts. Punctate stone lower pole right kidney. The bladder is unremarkable. Stomach/Bowel: The stomach is nonenlarged. No dilated small bowel. No acute bowel wall  thickening. Nonvisualized appendix. Vascular/Lymphatic: Moderate aortic atherosclerosis. No aneurysm. No suspicious nodes. Reproductive: Post treatment changes of the prostate. Other: Negative for pelvic effusion or free air Musculoskeletal: Scoliosis and advanced degenerative changes throughout the spine IMPRESSION: 1. No CT evidence for acute intra-abdominal or pelvic abnormality. 2. Gallstones 3. Punctate nonobstructing right kidney stone 4. Cardiomegaly Electronically Signed   By: Donavan Foil M.D.   On: 12/07/2020 21:53   DG Chest Port 1 View  Result Date: 12/07/2020 CLINICAL DATA:  Weakness. EXAM: PORTABLE CHEST 1 VIEW COMPARISON:  Chest x-ray dated June 10, 2020. FINDINGS: Unchanged mild cardiomegaly. Persistent low lung volumes. No focal consolidation, pleural effusion, or pneumothorax. Unchanged mild elevation of the right hemidiaphragm. No acute osseous abnormality. IMPRESSION: No active disease. Electronically Signed   By: Titus Dubin M.D.   On: 12/07/2020 18:23     Assessment and Plan:   Paroxysmal Atrial fibrillation/flutter -- Remains in Afib after previous office visit with SR. Known diagnosis.  As in the past, the main reported symptom when patient is in atrial fibrillation is associated fatigue.  Per daughters, he may also be short of breath, likely 2/2 exacerbation of volume status as directly below. Currently rate controlled on current medications.  Compliance reported on anticoagulation with Eliquis. No s/sx of bleeding. CHA2DS2VASc score of at least 8 (CHF, HTN, agex2, DM2, strokex2, vascular).   --Continue IV amiodarone. Therapeutic on Eliquis and may pharmacologically convert to SR on amiodarone. No plan for DCCV today given hope of pharmacologic conversion. TSH wnl. ALT wnl.  --Toprol changed to Lopressor 25 mg BID  Allows for titration during his admission. --Continue Eliquis at 54m BID.  Daily CBC. --Continue digoxin, adjust if needed based on level check  today. Continue as long as Cr remains under 1.5. Check digoxin level. --Maintain electrolytes at goal. Monitor Cr. Daily BMET.   Chronic HFrEF, Pulmonary HTN -- Family reports dyspnea at home with crackles on exam.  Suspect some element of dependent edema and third spacing as contributing. 11/2019 LVEF 40-45%.  Volume up on exam in setting of recurrent atrial fibrillation.  PTA Lasix 30 mg daily.  Will start gentle IV diuresis with IV Lasix 20 mg twice daily and continue as renal function/BP allows.  Check BMET daily. BNP ordered, pending.  Replete electrolytes as below. Monitor I/Os, daily wt. Most recent wt 77.1kg. Continue digoxin as renal function allows. Will  recheck levels and recommend adjust as indicated based off of level.  Continue Jardiance 25 mg daily.  Consider repeat echo once back in NSR.   Hypokalemia, hypomagnesia  --Potassium 3.8.  KCl tab 20 M EQ ordered.  Reassess tomorrow, given also starting on IV Lasix, but as he is not on a home K supplement.  Magnesium 1.4. Started magnesium oxide 400 mg twice daily.  Continue magnesium with close monitoring to ensure no diarrhea/GI side effects.  Daily magnesium checks ordered.  Hypertension -- Current BP well controlled.  Continue current medications.   Elevated HS Tn Coronary artery disease --No CP.  Troponin elevated to 95, 96.  Minimally elevated and flat trending troponin.  Suspect supply demand ischemia in the setting of rapid ventricular rate and exacerbated volume status as above.  At this time, no indication for invasive ischemic workup. Aggressive risk factor modification with statin. Not on ASA given Eliquis. Continue medical management.    History of stroke --Continue OAC and statin therapy. BP control recommended. Reports medication compliance.   Anemia --Most recent CBC with stable Hgb. No s/sx of bleeding reported.  DM2 -SSI, Per IM.  On Jardiance 25 mg daily as an outpatient.   HLD goal LDL <70 --Continue current  statin.  08/2020 LDL 67    For questions or updates, please contact [place] Please consult [email or web address] for contact info under    Signed, Arvil Chaco, PA-C  12/08/2020 10:53 AM

## 2020-12-08 NOTE — Progress Notes (Signed)
Notified patient had developed respiratory distress with wheezing, cold and diaphoretic at around 10:30 PM.  O2 requirements increased to 7 L to maintain O2 saturations greater than 92%.  Evaluation of the patient was noted to have wheezes with crackles noted on the right lung field.  Patient was given albuterol breathing treatment, Lasix 20 mg IV, and orders placed for transfer to stepdown bed for closer monitoring.  Nursing staff note that symptoms started after patient was laid flat and turned.  Chest x-ray confirms asymmetric edema on the right side.  Patient drip at this time, but appears to be clinically improving at this time.

## 2020-12-09 DIAGNOSIS — I483 Typical atrial flutter: Secondary | ICD-10-CM

## 2020-12-09 DIAGNOSIS — G9341 Metabolic encephalopathy: Secondary | ICD-10-CM

## 2020-12-09 DIAGNOSIS — I4819 Other persistent atrial fibrillation: Secondary | ICD-10-CM

## 2020-12-09 DIAGNOSIS — N189 Chronic kidney disease, unspecified: Secondary | ICD-10-CM

## 2020-12-09 DIAGNOSIS — N179 Acute kidney failure, unspecified: Secondary | ICD-10-CM

## 2020-12-09 DIAGNOSIS — J189 Pneumonia, unspecified organism: Secondary | ICD-10-CM

## 2020-12-09 LAB — BASIC METABOLIC PANEL
Anion gap: 13 (ref 5–15)
BUN: 29 mg/dL — ABNORMAL HIGH (ref 8–23)
CO2: 24 mmol/L (ref 22–32)
Calcium: 8.5 mg/dL — ABNORMAL LOW (ref 8.9–10.3)
Chloride: 100 mmol/L (ref 98–111)
Creatinine, Ser: 1.7 mg/dL — ABNORMAL HIGH (ref 0.61–1.24)
GFR, Estimated: 38 mL/min — ABNORMAL LOW (ref >60)
Glucose, Bld: 216 mg/dL — ABNORMAL HIGH (ref 70–99)
Potassium: 4.6 mmol/L (ref 3.5–5.1)
Sodium: 137 mmol/L (ref 135–145)

## 2020-12-09 LAB — MAGNESIUM
Magnesium: 1.9 mg/dL (ref 1.7–2.4)
Magnesium: 2 mg/dL (ref 1.7–2.4)

## 2020-12-09 LAB — MRSA NEXT GEN BY PCR, NASAL: MRSA by PCR Next Gen: NOT DETECTED

## 2020-12-09 LAB — GLUCOSE, CAPILLARY
Glucose-Capillary: 192 mg/dL — ABNORMAL HIGH (ref 70–99)
Glucose-Capillary: 198 mg/dL — ABNORMAL HIGH (ref 70–99)
Glucose-Capillary: 201 mg/dL — ABNORMAL HIGH (ref 70–99)
Glucose-Capillary: 211 mg/dL — ABNORMAL HIGH (ref 70–99)
Glucose-Capillary: 213 mg/dL — ABNORMAL HIGH (ref 70–99)

## 2020-12-09 LAB — TROPONIN I (HIGH SENSITIVITY)
Troponin I (High Sensitivity): 127 ng/L (ref <18)
Troponin I (High Sensitivity): 149 ng/L (ref <18)

## 2020-12-09 MED ORDER — CHLORHEXIDINE GLUCONATE CLOTH 2 % EX PADS
6.0000 | MEDICATED_PAD | Freq: Every day | CUTANEOUS | Status: DC
  Administered 2020-12-09 – 2020-12-12 (×4): 6 via TOPICAL

## 2020-12-09 MED ORDER — SODIUM CHLORIDE 0.9 % IV SOLN
2.0000 g | INTRAVENOUS | Status: DC
  Administered 2020-12-09 – 2020-12-13 (×5): 2 g via INTRAVENOUS
  Filled 2020-12-09: qty 20
  Filled 2020-12-09: qty 2
  Filled 2020-12-09 (×2): qty 20
  Filled 2020-12-09: qty 2

## 2020-12-09 MED ORDER — HALOPERIDOL LACTATE 5 MG/ML IJ SOLN: 1.0000 mg | Freq: Four times a day (QID) | INTRAMUSCULAR | Status: DC | PRN

## 2020-12-09 MED ORDER — RISPERIDONE 0.5 MG PO TABS
0.5000 mg | ORAL_TABLET | Freq: Two times a day (BID) | ORAL | Status: DC | PRN
  Filled 2020-12-09: qty 1

## 2020-12-09 MED ORDER — QUETIAPINE FUMARATE 25 MG PO TABS
12.5000 mg | ORAL_TABLET | Freq: Every evening | ORAL | Status: DC | PRN
  Administered 2020-12-09 – 2020-12-10 (×2): 12.5 mg via ORAL
  Filled 2020-12-09 (×2): qty 1

## 2020-12-09 MED ORDER — IPRATROPIUM-ALBUTEROL 0.5-2.5 (3) MG/3ML IN SOLN
3.0000 mL | Freq: Four times a day (QID) | RESPIRATORY_TRACT | Status: DC
  Administered 2020-12-09 – 2020-12-10 (×4): 3 mL via RESPIRATORY_TRACT
  Filled 2020-12-09 (×4): qty 3

## 2020-12-09 MED ORDER — METHYLPREDNISOLONE SODIUM SUCC 125 MG IJ SOLR
60.0000 mg | Freq: Once | INTRAMUSCULAR | Status: AC
  Administered 2020-12-09: 60 mg via INTRAVENOUS
  Filled 2020-12-09: qty 2

## 2020-12-09 MED ORDER — DOXYCYCLINE HYCLATE 100 MG PO TABS
100.0000 mg | ORAL_TABLET | Freq: Two times a day (BID) | ORAL | Status: AC
  Administered 2020-12-09 – 2020-12-13 (×10): 100 mg via ORAL
  Filled 2020-12-09 (×10): qty 1

## 2020-12-09 MED ORDER — BISACODYL 5 MG PO TBEC
5.0000 mg | DELAYED_RELEASE_TABLET | Freq: Every day | ORAL | Status: DC | PRN
  Administered 2020-12-09 – 2020-12-13 (×4): 5 mg via ORAL
  Filled 2020-12-09 (×5): qty 1

## 2020-12-09 MED ORDER — POLYETHYLENE GLYCOL 3350 17 G PO PACK
17.0000 g | PACK | Freq: Every day | ORAL | Status: DC
  Administered 2020-12-09 – 2020-12-16 (×7): 17 g via ORAL
  Filled 2020-12-09 (×8): qty 1

## 2020-12-09 NOTE — Progress Notes (Signed)
Chaplain Maggie made initial visit with patient at bedside. Patient was alert and engaging. Chaplain will follow up to meet family to introduce spiritual/emotional support.

## 2020-12-09 NOTE — H&P (View-Only) (Signed)
Progress Note  Patient Name: Todd [name] Date of Encounter: 12/09/2020  Primary Cardiologist: Rockey Situ  Subjective   Currently in atrial flutter with RVR with ventricular rates of 120 bpm. He did take an Eliquis on the morning of his presentation, 7/5, at home. He did receive an Eliquis in the ED around 12 AM on 7/6 followed by a dose around 10 AM and in the evening of 7/6. He has been transitioned from Toprl XL to Lopressor and remains on amiodarone gtt as well as digoxin every other day (last dose 7/6). BP stable. HS-Tn 149. BUN/SCr 24/1.35 trending to 29/1.7. Potassium and magnesium at goal. Digoxin level < 0.2.   Inpatient Medications    Scheduled Meds:  apixaban  5 mg Oral BID   atorvastatin  20 mg Oral Daily   carbidopa-levodopa  1 tablet Oral TID   Chlorhexidine Gluconate Cloth  6 each Topical Q0600   digoxin  0.125 mg Oral QODAY   diltiazem       doxycycline  100 mg Oral Q12H   DULoxetine  30 mg Oral Daily   furosemide  20 mg Intravenous BID   insulin aspart  0-9 Units Subcutaneous TID WC   insulin glargine  6 Units Subcutaneous QHS   ipratropium-albuterol  3 mL Nebulization Q6H   latanoprost  1 drop Both Eyes QHS   magnesium oxide  400 mg Oral BID   metoprolol tartrate  25 mg Oral BID   sodium chloride flush  3 mL Intravenous Q12H   Continuous Infusions:  sodium chloride 10 mL/hr at 12/09/20 0938   amiodarone 30 mg/hr (12/09/20 0938)   cefTRIAXone (ROCEPHIN)  IV Stopped (12/09/20 0912)   PRN Meds: acetaminophen **OR** acetaminophen, haloperidol lactate, ondansetron **OR** ondansetron (ZOFRAN) IV, QUEtiapine   Vital Signs    Vitals:   12/09/20 0500 12/09/20 0600 12/09/20 0800 12/09/20 0900  BP: (!) 134/93 (!) 139/91 (!) 165/135 125/88  Pulse: (!) 118 (!) 118 67 (!) 120  Resp: (!) 27 (!) 27 (!) 31 (!) 26  Temp:   98 F (36.7 C)   TempSrc:   Oral   SpO2: 95% 96% (!) 83% 98%  Weight:      Height:        Intake/Output Summary (Last 24 hours) at 12/09/2020  0951 Last data filed at 12/09/2020 8850 Gross per 24 hour  Intake 1103.5 ml  Output 400 ml  Net 703.5 ml   Filed Weights   12/07/20 1705 12/09/20 0450  Weight: 77.1 kg 86.1 kg    Telemetry    Afib with RVR initially with development of atrial flutter with RVR over the past 24 hours with ventricular rates of 120 bpm - Personally Reviewed  ECG    No new tracings - Personally Reviewed  Physical Exam   GEN: No acute distress.   Neck: No JVD. Cardiac: Tachycardic, I/VI systolic murmur, no rubs, or gallops.  Respiratory: Diminished breath sounds bilaterally.  GI: Soft, nontender, non-distended.   MS: No edema; No deformity. Neuro:  Alert, aphasic.  Psych: Normal affect.  Labs    Chemistry Recent Labs  Lab 12/07/20 1719 12/08/20 0657 12/09/20 0204  NA 139 141 137  K 4.1 3.8 4.6  CL 103 107 100  CO2 29 23 24   GLUCOSE 202* 184* 216*  BUN 24* 24* 29*  CREATININE 1.40* 1.35* 1.70*  CALCIUM 8.5* 8.3* 8.5*  PROT 6.7  --   --   ALBUMIN 3.7  --   --  AST 20  --   --   ALT <5  --   --   ALKPHOS 52  --   --   BILITOT 0.6  --   --   GFRNONAA 48* 50* 38*  ANIONGAP 7 11 13      Hematology Recent Labs  Lab 12/07/20 1719 12/08/20 0657  WBC 7.1 7.0  RBC 3.16* 3.17*  HGB 9.9* 9.8*  HCT 31.3* 30.8*  MCV 99.1 97.2  MCH 31.3 30.9  MCHC 31.6 31.8  RDW 12.8 12.8  PLT 199 183    Cardiac EnzymesNo results for input(s): TROPONINI in the last 168 hours. No results for input(s): TROPIPOC in the last 168 hours.   BNP Recent Labs  Lab 12/08/20 0657  BNP 699.5*     DDimer No results for input(s): DDIMER in the last 168 hours.   Radiology    CT ABDOMEN PELVIS WO CONTRAST  Result Date: 12/07/2020 IMPRESSION: 1. No CT evidence for acute intra-abdominal or pelvic abnormality. 2. Gallstones 3. Punctate nonobstructing right kidney stone 4. Cardiomegaly Electronically Signed   By: Donavan Foil M.D.   On: 12/07/2020 21:53   DG Chest 1 View  Result Date:  12/08/2020 IMPRESSION: Cardiomegaly. Slight asymmetric hazy opacity in the right thorax could reflect asymmetric edema or potential diffuse infection. Electronically Signed   By: Donavan Foil M.D.   On: 12/08/2020 22:57   DG Chest Port 1 View  Result Date: 12/07/2020 IMPRESSION: No active disease. Electronically Signed   By: Titus Dubin M.D.   On: 12/07/2020 18:23    Cardiac Studies   2D echo 11/17/2020: 1. Left ventricular ejection fraction, by estimation, is 50 to 55%. The  left ventricle has low normal function. The left ventricle has no regional  wall motion abnormalities. There is moderate left ventricular hypertrophy.  Left ventricular diastolic  parameters are indeterminate.   2. Right ventricular systolic function is normal. The right ventricular  size is normal. There is normal pulmonary artery systolic pressure.   3. Left atrial size was moderately dilated.   4. Mild mitral valve regurgitation.   5. There is dilatation of the ascending aorta measuring 39 mm.   Patient Profile     87 y.o. 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  Assessment & Plan    1. Afib/flutter: -Presented in Afib with RVR and is currently in atrial flutter with RVR with ventricular rates in the 120s bpm -His daughter reports he did take an Eliquis the morning of presentation, 7/5, and indicates he has not missed any doses -He was given 7/5 PM Eliquis at 1238 AM on 7/6, followed by typical BID dosing that day -Continue Eliquis given a CHADS2VASc of 8 (CHF, HTN, age x 2, DM, CVA x 2, vascular disease) -He remains on amiodarone gtt for added rate control -Continue Lopressor in  place of Toprol XL for added rate control -This morning, his rates remain tachycardic at 120 bpm, though just received Lopressor, will assess his response to this, may need to titrate  -Continue PTA digoxin every other day, for now, pending renal function trend this may need to be held (due for next dose 7/8) -Plan for DCCV 7/8 with Dr. Saunders Revel, risks and benefits  discussed with family who discussed amongst themselves  -Lytes at goal, recent TSH normal  2. Elevated HS-Tn: -Minimally elevated and flat trending, not consistent with ACS -Likely supply demand ischemia in the setting of Afib with RVR, anemia, HTN, and CKD -Recent echo with low normal LVSF -No plans for inpatient ischemia workup at this time -Outpatient follow up  3. Systolic dysfunction: -Prior echo in 2020 with an EF of 45%, most recent echo demonstrating low normal LVSF -Hold PM dose of IV Lasix with AKI -Lopressor   4. History of CVA with residual aphasia and right-sided hemiparesis: -Stable -Eliquis as above -Lipitor -Outpatient follow up  5. Anemia: -Stable -Monitor   6. AKI: -Hold IV Lasix this afternoon  -Trend  7. HLD: -LDL 67 in 08/2020 -Lipitor  For questions or updates, please contact [place] Please consult [email or web address] for contact info under Cardiology/STEMI.    Signed, Christell Faith, PA-C [place] Pager: [phone] 12/09/2020, 9:51 AM

## 2020-12-09 NOTE — Progress Notes (Signed)
PT Cancellation Note  Patient Details Name: Todd [name] MRN: [id number] DOB: 07-Mar-1931   Cancelled Treatment:    Reason Eval/Treat Not Completed: Medical issues which prohibited therapy (Pt transfered to ICU, will hold our services at this time and await new order or continuance of existing order.)  7:54 AM, 12/09/20 Etta Grandchild, PT, DPT Physical Therapist - [facility]  [phone] ([place])    [place] C 12/09/2020, 7:53 AM

## 2020-12-09 NOTE — Progress Notes (Signed)
OT Cancellation Note  Patient Details Name: Todd [name] MRN: [id number] DOB: 1931/03/03   Cancelled Treatment:    Reason Eval/Treat Not Completed: Medical issues which prohibited therapy Upon Chart review this AM, noted that Pt transferred to SDU d/t developing respiratory distress with higher O2 requirements and presence of wheezes and crackles. Will hold OT services at this time and await new order or continuance of current order to complete OT evaluation. Thank you.  Gerrianne Scale, [place], OTR/L ascom [phone] 12/09/20, 8:09 AM

## 2020-12-09 NOTE — Progress Notes (Signed)
Patient ID: Todd [name], male   DOB: August 21, 1930, 87 y.o.   MRN: [id number] [facility] PROGRESS NOTE  [place] QIH:[id number] DOB: Feb 09, 1931 DOA: 12/07/2020 PCP: Park Liter P, DO  HPI/Subjective: Patient transferred to the ICU for worsening respiratory status.  IV fluids discontinued and the patient was given Lasix.  Initially admitted for rapid atrial fibrillation.  Repeat chest x-ray showing possible pneumonia.  Objective: Vitals:   12/09/20 0800 12/09/20 0900  BP: (!) 165/135 125/88  Pulse: 67 (!) 120  Resp: (!) 31 (!) 26  Temp: 98 F (36.7 C)   SpO2: (!) 83% 98%    Intake/Output Summary (Last 24 hours) at 12/09/2020 1303 Last data filed at 12/09/2020 8756 Gross per 24 hour  Intake 1103.5 ml  Output 400 ml  Net 703.5 ml   Filed Weights   12/07/20 1705 12/09/20 0450  Weight: 77.1 kg 86.1 kg    ROS: Review of Systems  Respiratory:  Positive for cough. Negative for shortness of breath.   Cardiovascular:  Negative for chest pain.  Gastrointestinal:  Negative for abdominal pain.  Exam: Physical Exam HENT:     Head: Normocephalic.     Mouth/Throat:     Pharynx: No oropharyngeal exudate.  Eyes:     General: Lids are normal.     Conjunctiva/sclera: Conjunctivae normal.     Pupils: Pupils are equal, round, and reactive to light.  Cardiovascular:     Rate and Rhythm: Tachycardia present. Rhythm irregularly irregular.     Heart sounds: Normal heart sounds, S1 normal and S2 normal.  Pulmonary:     Breath sounds: Examination of the right-lower field reveals decreased breath sounds and rhonchi. Examination of the left-lower field reveals decreased breath sounds. Decreased breath sounds and rhonchi present. No wheezing or rales.  Abdominal:     Palpations: Abdomen is soft.     Tenderness: There is no abdominal tenderness.  Musculoskeletal:     Right lower leg: Swelling present.     Left lower leg: Swelling present.  Skin:    General: Skin is warm.     Findings: No  rash.  Neurological:     Mental Status: He is alert.     Comments: Answers more questions today than yesterday.     Data Reviewed: Basic Metabolic Panel: Recent Labs  Lab 12/07/20 1719 12/08/20 0657 12/08/20 2307 12/09/20 0204  NA 139 141  --  137  K 4.1 3.8  --  4.6  CL 103 107  --  100  CO2 29 23  --  24  GLUCOSE 202* 184*  --  216*  BUN 24* 24*  --  29*  CREATININE 1.40* 1.35*  --  1.70*  CALCIUM 8.5* 8.3*  --  8.5*  MG  --  1.4* 1.9 2.0   Liver Function Tests: Recent Labs  Lab 12/07/20 1719  AST 20  ALT <5  ALKPHOS 52  BILITOT 0.6  PROT 6.7  ALBUMIN 3.7   CBC: Recent Labs  Lab 12/07/20 1719 12/08/20 0657  WBC 7.1 7.0  NEUTROABS 4.4  --   HGB 9.9* 9.8*  HCT 31.3* 30.8*  MCV 99.1 97.2  PLT 199 183    BNP (last 3 results) Recent Labs    12/08/20 0657  BNP 699.5*     CBG: Recent Labs  Lab 12/08/20 2029 12/08/20 2305 12/09/20 0013 12/09/20 0735 12/09/20 1116  GLUCAP 179* 238* 213* 201* 192*    Recent Results (from the past 240 hour(s))  Resp Panel  by RT-PCR (Flu A&B, Covid) Nasopharyngeal Swab     Status: None   Collection Time: 12/07/20  8:40 PM   Specimen: Nasopharyngeal Swab; Nasopharyngeal(NP) swabs in vial transport medium  Result Value Ref Range Status   SARS Coronavirus 2 by RT PCR NEGATIVE NEGATIVE Final    Comment: (NOTE) SARS-CoV-2 target nucleic acids are NOT DETECTED.  The SARS-CoV-2 RNA is generally detectable in upper respiratory specimens during the acute phase of infection. The lowest concentration of SARS-CoV-2 viral copies this assay can detect is 138 copies/mL. A negative result does not preclude SARS-Cov-2 infection and should not be used as the sole basis for treatment or other patient management decisions. A negative result may occur with  improper specimen collection/handling, submission of specimen other than nasopharyngeal swab, presence of viral mutation(s) within the areas targeted by this assay, and  inadequate number of viral copies(<138 copies/mL). A negative result must be combined with clinical observations, patient history, and epidemiological information. The expected result is Negative.  Fact Sheet for Patients:  EntrepreneurPulse.com.au  Fact Sheet for Healthcare Providers:  IncredibleEmployment.be  This test is no t yet approved or cleared by the [place] FDA and  has been authorized for detection and/or diagnosis of SARS-CoV-2 by FDA under an Emergency Use Authorization (EUA). This EUA will remain  in effect (meaning this test can be used) for the duration of the COVID-19 declaration under Section 564(b)(1) of the Act, 21 U.S.C.section 360bbb-3(b)(1), unless the authorization is terminated  or revoked sooner.       Influenza A by PCR NEGATIVE NEGATIVE Final   Influenza B by PCR NEGATIVE NEGATIVE Final    Comment: (NOTE) The Xpert Xpress SARS-CoV-2/FLU/RSV plus assay is intended as an aid in the diagnosis of influenza from Nasopharyngeal swab specimens and should not be used as a sole basis for treatment. Nasal washings and aspirates are unacceptable for Xpert Xpress SARS-CoV-2/FLU/RSV testing.  Fact Sheet for Patients: EntrepreneurPulse.com.au  Fact Sheet for Healthcare Providers: IncredibleEmployment.be  This test is not yet approved or cleared by the [place] FDA and has been authorized for detection and/or diagnosis of SARS-CoV-2 by FDA under an Emergency Use Authorization (EUA). This EUA will remain in effect (meaning this test can be used) for the duration of the COVID-19 declaration under Section 564(b)(1) of the Act, 21 U.S.C. section 360bbb-3(b)(1), unless the authorization is terminated or revoked.  Performed at [facility], [address] [place], [place] [address]   Culture, blood (Routine X 2) w Reflex to ID Panel     Status: None (Preliminary result)    Collection Time: 12/08/20  7:04 AM   Specimen: BLOOD  Result Value Ref Range Status   Specimen Description BLOOD LEFT ANTECUBITAL  Final   Special Requests   Final    BOTTLES DRAWN AEROBIC AND ANAEROBIC Blood Culture adequate volume   Culture   Final    NO GROWTH 1 DAY Performed at [facility], [address], [place],  [address]    Report Status PENDING  Incomplete  Culture, blood (Routine X 2) w Reflex to ID Panel     Status: None (Preliminary result)   Collection Time: 12/08/20  7:05 AM   Specimen: BLOOD  Result Value Ref Range Status   Specimen Description BLOOD BLOOD LEFT HAND  Final   Special Requests   Final    BOTTLES DRAWN AEROBIC AND ANAEROBIC Blood Culture adequate volume   Culture   Final    NO GROWTH 1 DAY  Performed at [facility], [address] [place], [place] [address]    Report Status PENDING  Incomplete  MRSA Next Gen by PCR, Nasal     Status: None   Collection Time: 12/09/20 12:17 AM   Specimen: Nasal Mucosa; Nasal Swab  Result Value Ref Range Status   MRSA by PCR Next Gen NOT DETECTED NOT DETECTED Final    Comment: (NOTE) The GeneXpert MRSA Assay (FDA approved for NASAL specimens only), is one component of a comprehensive MRSA colonization surveillance program. It is not intended to diagnose MRSA infection nor to guide or monitor treatment for MRSA infections. Test performance is not FDA approved in patients less than 40 years old. Performed at [facility], [address] [place], [place] [address]      Studies: CT ABDOMEN PELVIS WO CONTRAST  Result Date: 12/07/2020 CLINICAL DATA:  Abdomen pain poor appetite EXAM: CT ABDOMEN AND PELVIS WITHOUT CONTRAST TECHNIQUE: Multidetector CT imaging of the abdomen and pelvis was performed following the standard protocol without IV contrast. COMPARISON:  CT 11/11/2018 FINDINGS: Lower chest: Lung bases demonstrate no acute consolidation or effusion. Cardiomegaly. Coronary  vascular calcification. Hepatobiliary: No focal hepatic abnormality. Small calcified gallstones. No biliary dilatation Pancreas: Unremarkable. No pancreatic ductal dilatation or surrounding inflammatory changes. Spleen: Normal in size without focal abnormality. Adrenals/Urinary Tract: Adrenal glands are within normal limits. Kidneys show no hydronephrosis. Low-density lesions in both kidneys likely cysts. Punctate stone lower pole right kidney. The bladder is unremarkable. Stomach/Bowel: The stomach is nonenlarged. No dilated small bowel. No acute bowel wall thickening. Nonvisualized appendix. Vascular/Lymphatic: Moderate aortic atherosclerosis. No aneurysm. No suspicious nodes. Reproductive: Post treatment changes of the prostate. Other: Negative for pelvic effusion or free air Musculoskeletal: Scoliosis and advanced degenerative changes throughout the spine IMPRESSION: 1. No CT evidence for acute intra-abdominal or pelvic abnormality. 2. Gallstones 3. Punctate nonobstructing right kidney stone 4. Cardiomegaly Electronically Signed   By: Donavan Foil M.D.   On: 12/07/2020 21:53   DG Chest 1 View  Result Date: 12/08/2020 CLINICAL DATA:  Shortness of breath EXAM: CHEST  1 VIEW COMPARISON:  12/07/2020, 11/11/2018 FINDINGS: Cardiomegaly. Elevation of right diaphragm. Mild asymmetric hazy opacity in the right thorax. No pleural effusion or pneumothorax. IMPRESSION: Cardiomegaly. Slight asymmetric hazy opacity in the right thorax could reflect asymmetric edema or potential diffuse infection. Electronically Signed   By: Donavan Foil M.D.   On: 12/08/2020 22:57   DG Chest Port 1 View  Result Date: 12/07/2020 CLINICAL DATA:  Weakness. EXAM: PORTABLE CHEST 1 VIEW COMPARISON:  Chest x-ray dated June 10, 2020. FINDINGS: Unchanged mild cardiomegaly. Persistent low lung volumes. No focal consolidation, pleural effusion, or pneumothorax. Unchanged mild elevation of the right hemidiaphragm. No acute osseous abnormality.  IMPRESSION: No active disease. Electronically Signed   By: Titus Dubin M.D.   On: 12/07/2020 18:23    Scheduled Meds:  apixaban  5 mg Oral BID   atorvastatin  20 mg Oral Daily   carbidopa-levodopa  1 tablet Oral TID   Chlorhexidine Gluconate Cloth  6 each Topical Q0600   digoxin  0.125 mg Oral QODAY   doxycycline  100 mg Oral Q12H   DULoxetine  30 mg Oral Daily   insulin aspart  0-9 Units Subcutaneous TID WC   insulin glargine  6 Units Subcutaneous QHS   ipratropium-albuterol  3 mL Nebulization Q6H   latanoprost  1 drop Both Eyes QHS   magnesium oxide  400 mg Oral BID   metoprolol tartrate  25 mg Oral BID   sodium chloride flush  3 mL Intravenous Q12H   Continuous Infusions:  sodium chloride 10 mL/hr at 12/09/20 0938   amiodarone 30 mg/hr (12/09/20 0938)   cefTRIAXone (ROCEPHIN)  IV Stopped (12/09/20 0912)    Assessment/Plan:  Atrial fibrillation with rapid ventricular response.  Patient on Eliquis for anticoagulation.  On amiodarone drip and metoprolol.  Every other day digoxin.  Cardiology following and may end up doing a cardioversion tomorrow. Right lower lobe pneumonia start Rocephin and doxycycline Acute kidney injury on chronic kidney disease stage IIIa after IV Lasix.  Hold Lasix.  Creatinine went up from 1.35 yesterday up to 1.70.  Recheck BMP tomorrow. Acute metabolic encephalopathy.  Initially I ordered Haldol but since the patient is on Sinemet Risperdal likely be a better option during the day and Seroquel at night. Hypomagnesemia replaced yesterday Prior history of ischemic stroke with baseline right-sided weakness. Type 2 diabetes mellitus with hyperlipidemia.  Continue atorvastatin.  Holding Jardiance and metformin while here in the hospital.  On low-dose Lantus insulin Lactic acidosis.  Holding Glucophage. Anemia of chronic disease Depression anxiety on Cymbalta Chronic respiratory failure on chronic 4 L of oxygen     Code Status:     Code Status Orders   (From admission, onward)           Start     Ordered   12/07/20 2334  Full code  Continuous        12/07/20 2335           Code Status History     Date Active Date Inactive Code Status Order ID Comments User Context   11/11/2018 1817 11/17/2018 1950 Full Code [id number]  Gorden Harms, MD Inpatient   04/20/2018 2122 04/23/2018 1849 Full Code [id number]  Gladstone Lighter, MD Inpatient   05/18/2017 0303 05/21/2017 1928 Full Code [id number]  Lance Coon, MD Inpatient   03/17/2017 0419 03/17/2017 2109 Full Code [id number]  Saundra Shelling, MD ED   03/05/2015 0019 03/05/2015 1507 Full Code [id number]  Lance Coon, MD Inpatient      Family Communication: Spoke with daughter at the bedside Disposition Plan: Status is: Inpatient  Dispo: The patient is from: Home              Anticipated d/c is to: Home              Patient currently transferred to the stepdown unit overnight secondary to oxygen requirements and acute metabolic encephalopathy.   Difficult to place patient.  No.  Consultants: Cardiology  Time spent: 28 minutes  [place]

## 2020-12-09 NOTE — Progress Notes (Signed)
Progress Note  Patient Name: Todd [name] Date of Encounter: 12/09/2020  Primary Cardiologist: Rockey Situ  Subjective   Currently in atrial flutter with RVR with ventricular rates of 120 bpm. He did take an Eliquis on the morning of his presentation, 7/5, at home. He did receive an Eliquis in the ED around 12 AM on 7/6 followed by a dose around 10 AM and in the evening of 7/6. He has been transitioned from Toprl XL to Lopressor and remains on amiodarone gtt as well as digoxin every other day (last dose 7/6). BP stable. HS-Tn 149. BUN/SCr 24/1.35 trending to 29/1.7. Potassium and magnesium at goal. Digoxin level < 0.2.   Inpatient Medications    Scheduled Meds:  apixaban  5 mg Oral BID   atorvastatin  20 mg Oral Daily   carbidopa-levodopa  1 tablet Oral TID   Chlorhexidine Gluconate Cloth  6 each Topical Q0600   digoxin  0.125 mg Oral QODAY   diltiazem       doxycycline  100 mg Oral Q12H   DULoxetine  30 mg Oral Daily   furosemide  20 mg Intravenous BID   insulin aspart  0-9 Units Subcutaneous TID WC   insulin glargine  6 Units Subcutaneous QHS   ipratropium-albuterol  3 mL Nebulization Q6H   latanoprost  1 drop Both Eyes QHS   magnesium oxide  400 mg Oral BID   metoprolol tartrate  25 mg Oral BID   sodium chloride flush  3 mL Intravenous Q12H   Continuous Infusions:  sodium chloride 10 mL/hr at 12/09/20 0938   amiodarone 30 mg/hr (12/09/20 0938)   cefTRIAXone (ROCEPHIN)  IV Stopped (12/09/20 0912)   PRN Meds: acetaminophen **OR** acetaminophen, haloperidol lactate, ondansetron **OR** ondansetron (ZOFRAN) IV, QUEtiapine   Vital Signs    Vitals:   12/09/20 0500 12/09/20 0600 12/09/20 0800 12/09/20 0900  BP: (!) 134/93 (!) 139/91 (!) 165/135 125/88  Pulse: (!) 118 (!) 118 67 (!) 120  Resp: (!) 27 (!) 27 (!) 31 (!) 26  Temp:   98 F (36.7 C)   TempSrc:   Oral   SpO2: 95% 96% (!) 83% 98%  Weight:      Height:        Intake/Output Summary (Last 24 hours) at 12/09/2020  0951 Last data filed at 12/09/2020 6834 Gross per 24 hour  Intake 1103.5 ml  Output 400 ml  Net 703.5 ml   Filed Weights   12/07/20 1705 12/09/20 0450  Weight: 77.1 kg 86.1 kg    Telemetry    Afib with RVR initially with development of atrial flutter with RVR over the past 24 hours with ventricular rates of 120 bpm - Personally Reviewed  ECG    No new tracings - Personally Reviewed  Physical Exam   GEN: No acute distress.   Neck: No JVD. Cardiac: Tachycardic, I/VI systolic murmur, no rubs, or gallops.  Respiratory: Diminished breath sounds bilaterally.  GI: Soft, nontender, non-distended.   MS: No edema; No deformity. Neuro:  Alert, aphasic.  Psych: Normal affect.  Labs    Chemistry Recent Labs  Lab 12/07/20 1719 12/08/20 0657 12/09/20 0204  NA 139 141 137  K 4.1 3.8 4.6  CL 103 107 100  CO2 29 23 24   GLUCOSE 202* 184* 216*  BUN 24* 24* 29*  CREATININE 1.40* 1.35* 1.70*  CALCIUM 8.5* 8.3* 8.5*  PROT 6.7  --   --   ALBUMIN 3.7  --   --  AST 20  --   --   ALT <5  --   --   ALKPHOS 52  --   --   BILITOT 0.6  --   --   GFRNONAA 48* 50* 38*  ANIONGAP 7 11 13      Hematology Recent Labs  Lab 12/07/20 1719 12/08/20 0657  WBC 7.1 7.0  RBC 3.16* 3.17*  HGB 9.9* 9.8*  HCT 31.3* 30.8*  MCV 99.1 97.2  MCH 31.3 30.9  MCHC 31.6 31.8  RDW 12.8 12.8  PLT 199 183    Cardiac EnzymesNo results for input(s): TROPONINI in the last 168 hours. No results for input(s): TROPIPOC in the last 168 hours.   BNP Recent Labs  Lab 12/08/20 0657  BNP 699.5*     DDimer No results for input(s): DDIMER in the last 168 hours.   Radiology    CT ABDOMEN PELVIS WO CONTRAST  Result Date: 12/07/2020 IMPRESSION: 1. No CT evidence for acute intra-abdominal or pelvic abnormality. 2. Gallstones 3. Punctate nonobstructing right kidney stone 4. Cardiomegaly Electronically Signed   By: Donavan Foil M.D.   On: 12/07/2020 21:53   DG Chest 1 View  Result Date:  12/08/2020 IMPRESSION: Cardiomegaly. Slight asymmetric hazy opacity in the right thorax could reflect asymmetric edema or potential diffuse infection. Electronically Signed   By: Donavan Foil M.D.   On: 12/08/2020 22:57   DG Chest Port 1 View  Result Date: 12/07/2020 IMPRESSION: No active disease. Electronically Signed   By: Titus Dubin M.D.   On: 12/07/2020 18:23    Cardiac Studies   2D echo 11/17/2020: 1. Left ventricular ejection fraction, by estimation, is 50 to 55%. The  left ventricle has low normal function. The left ventricle has no regional  wall motion abnormalities. There is moderate left ventricular hypertrophy.  Left ventricular diastolic  parameters are indeterminate.   2. Right ventricular systolic function is normal. The right ventricular  size is normal. There is normal pulmonary artery systolic pressure.   3. Left atrial size was moderately dilated.   4. Mild mitral valve regurgitation.   5. There is dilatation of the ascending aorta measuring 39 mm.   Patient Profile     87 y.o. 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  Assessment & Plan    1. Afib/flutter: -Presented in Afib with RVR and is currently in atrial flutter with RVR with ventricular rates in the 120s bpm -His daughter reports he did take an Eliquis the morning of presentation, 7/5, and indicates he has not missed any doses -He was given 7/5 PM Eliquis at 1238 AM on 7/6, followed by typical BID dosing that day -Continue Eliquis given a CHADS2VASc of 8 (CHF, HTN, age x 2, DM, CVA x 2, vascular disease) -He remains on amiodarone gtt for added rate control -Continue Lopressor in  place of Toprol XL for added rate control -This morning, his rates remain tachycardic at 120 bpm, though just received Lopressor, will assess his response to this, may need to titrate  -Continue PTA digoxin every other day, for now, pending renal function trend this may need to be held (due for next dose 7/8) -Plan for DCCV 7/8 with Dr. Saunders Revel, risks and benefits  discussed with family who discussed amongst themselves  -Lytes at goal, recent TSH normal  2. Elevated HS-Tn: -Minimally elevated and flat trending, not consistent with ACS -Likely supply demand ischemia in the setting of Afib with RVR, anemia, HTN, and CKD -Recent echo with low normal LVSF -No plans for inpatient ischemia workup at this time -Outpatient follow up  3. Systolic dysfunction: -Prior echo in 2020 with an EF of 45%, most recent echo demonstrating low normal LVSF -Hold PM dose of IV Lasix with AKI -Lopressor   4. History of CVA with residual aphasia and right-sided hemiparesis: -Stable -Eliquis as above -Lipitor -Outpatient follow up  5. Anemia: -Stable -Monitor   6. AKI: -Hold IV Lasix this afternoon  -Trend  7. HLD: -LDL 67 in 08/2020 -Lipitor  For questions or updates, please contact [place] Please consult [email or web address] for contact info under Cardiology/STEMI.    Signed, Christell Faith, PA-C [place] Pager: [phone] 12/09/2020, 9:51 AM

## 2020-12-10 ENCOUNTER — Encounter: Payer: Self-pay | Admitting: Internal Medicine

## 2020-12-10 ENCOUNTER — Encounter
Admission: EM | Disposition: A | Discharge status: Skilled Nursing Facility | Payer: Self-pay | Source: Home / Self Care | Attending: Internal Medicine

## 2020-12-10 ENCOUNTER — Inpatient Hospital Stay: Payer: HMO | Admitting: Anesthesiology

## 2020-12-10 ENCOUNTER — Other Ambulatory Visit: Payer: HMO

## 2020-12-10 DIAGNOSIS — I2510 Atherosclerotic heart disease of native coronary artery without angina pectoris: Secondary | ICD-10-CM

## 2020-12-10 DIAGNOSIS — J9621 Acute and chronic respiratory failure with hypoxia: Secondary | ICD-10-CM

## 2020-12-10 DIAGNOSIS — I639 Cerebral infarction, unspecified: Secondary | ICD-10-CM

## 2020-12-10 DIAGNOSIS — I4892 Unspecified atrial flutter: Secondary | ICD-10-CM

## 2020-12-10 DIAGNOSIS — R9431 Abnormal electrocardiogram [ECG] [EKG]: Secondary | ICD-10-CM

## 2020-12-10 DIAGNOSIS — R778 Other specified abnormalities of plasma proteins: Secondary | ICD-10-CM

## 2020-12-10 HISTORY — PX: CARDIOVERSION: SHX1299

## 2020-12-10 LAB — MAGNESIUM: Magnesium: 1.9 mg/dL (ref 1.7–2.4)

## 2020-12-10 LAB — GLUCOSE, CAPILLARY
Glucose-Capillary: 132 mg/dL — ABNORMAL HIGH (ref 70–99)
Glucose-Capillary: 197 mg/dL — ABNORMAL HIGH (ref 70–99)
Glucose-Capillary: 169 mg/dL — ABNORMAL HIGH (ref 70–99)
Glucose-Capillary: 251 mg/dL — ABNORMAL HIGH (ref 70–99)

## 2020-12-10 LAB — PROTIME-INR
INR: 1.4 — ABNORMAL HIGH (ref 0.8–1.2)
Prothrombin Time: 17.4 seconds — ABNORMAL HIGH (ref 11.4–15.2)

## 2020-12-10 SURGERY — CARDIOVERSION
Anesthesia: General

## 2020-12-10 MED ORDER — IPRATROPIUM-ALBUTEROL 0.5-2.5 (3) MG/3ML IN SOLN: 3.0000 mL | RESPIRATORY_TRACT | Status: DC | PRN

## 2020-12-10 MED ORDER — PROPOFOL 10 MG/ML IV BOLUS
INTRAVENOUS | Status: DC | PRN
  Administered 2020-12-10: 40 mg via INTRAVENOUS

## 2020-12-10 NOTE — TOC Initial Note (Signed)
Transition of Care [facility]) - Initial/Assessment Note    Patient Details  Name: Todd [name] MRN: [id number] Date of Birth: Feb 25, 1931  Transition of Care [facility]) CM/SW Contact:    Kerin Salen, RN Phone Number: 12/10/2020, 11:11 AM  Clinical Narrative:  Patient in ICU had Cardioversion today, daughter [name] at bedside. Spoke with [name], who says patient prior to hospital lived with Son, Berneta Sages and daughter-in-law [name]. He was able to do ADL's however did require some assistance which was provided by Son and Daughters. [name] does cooking and shopping, patient with good appetite able to feed himself. Patient uses rolling walker for ambulation and Oxygen 4L continuous, serviced by [employer]. No home health services and [name] agrees to [facility] however prefers SNF. [name] states patient had SNF 4 years ago at [facility] due to stroke. Daughters provides transportation to medical visits. Patient uses Geographical information systems officer in [place].  TOC to continue to track for discharge needs.                 Expected Discharge Plan: [place] Barriers to Discharge: Continued Medical Work up   Patient Goals and CMS Choice Patient states their goals for this hospitalization and ongoing recovery are:: Daughter, [name] prefers patient to have Rehab. before returning to home.      Expected Discharge Plan and Services Expected Discharge Plan: [place] In-house Referral: Clinical Social Work     Living arrangements for the past 2 months: Single Family Home                                      Prior Living Arrangements/Services Living arrangements for the past 2 months: Single Family Home Lives with:: Adult Children          Need for Family Participation in Patient Care: Yes (Comment) Care giver support system in place?: Yes (comment) Current home services: DME (Oxygen, 4L Continuous, Rolling Walker) Criminal Activity/Legal Involvement Pertinent to Current  Situation/Hospitalization: No - Comment as needed  Activities of Daily Living Home Assistive Devices/Equipment: Environmental consultant (specify type) ADL Screening (condition at time of admission) Patient's cognitive ability adequate to safely complete daily activities?: No Is the patient deaf or have difficulty hearing?: No Does the patient have difficulty seeing, even when wearing glasses/contacts?: No Does the patient have difficulty concentrating, remembering, or making decisions?: Yes Patient able to express need for assistance with ADLs?: No Does the patient have difficulty dressing or bathing?: Yes Independently performs ADLs?: No Does the patient have difficulty walking or climbing stairs?: Yes Weakness of Legs: None Weakness of Arms/Hands: None  Permission Sought/Granted                  Emotional Assessment Appearance:: Appears stated age Attitude/Demeanor/Rapport: Unable to Assess Affect (typically observed): Unable to Assess   Alcohol / Substance Use: Not Applicable Psych Involvement: No (comment)  Admission diagnosis:  A-fib ([place]) [I48.91] Atrial fibrillation with rapid ventricular response (HCC) [I48.91] Paroxysmal atrial fibrillation with rapid ventricular response (HCC) [I48.0] Sepsis, due to unspecified organism, unspecified whether acute organ dysfunction present [facility]) [A41.9] Patient Active Problem List   Diagnosis Date Noted   Pneumonia of right lower lobe due to infectious organism    Acute kidney injury superimposed on CKD ([place])    Acute metabolic encephalopathy    Atrial fibrillation and flutter ([place]) 12/08/2020   Hypomagnesemia    Cerebrovascular accident (CVA) ([place])  Paroxysmal atrial fibrillation with rapid ventricular response ([place]) 12/07/2020   Lower urinary tract symptoms (LUTS) 05/19/2020   Atrial fibrillation with rapid ventricular response ([place]) 11/11/2018   Frequent PVCs    Anemia due to stage 3 chronic kidney disease ([place]) 09/16/2018   Normocytic  anemia 05/17/2018   Hemiplegia ([place]) 04/25/2018   B12 deficiency 04/25/2018   Dysphagia as late effect of cerebrovascular accident (CVA) 04/25/2018   Depression, major, single episode, severe ([place]) 04/25/2018   CHF (congestive heart failure) ([place]) 01/09/2018   Advanced care planning/counseling discussion 12/20/2017   Hypoxia 11/02/2017   Mixed hyperlipidemia 09/21/2017   Aphasia 05/18/2017   CAD (coronary artery disease) 05/18/2017   History of stroke with residual deficit 05/18/2017   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 11/09/2015   Knee joint replaced by other means 07/04/2015   Urinary incontinence 04/27/2015   Anxiety, generalized 04/07/2015   History of BCG vaccination 04/07/2015   GERD (gastroesophageal reflux disease) 03/04/2015   Anxiety and depression 02/25/2015   Degeneration of intervertebral disc of lumbar region 02/23/2015   Neuritis or radiculitis due to rupture of lumbar intervertebral disc 02/23/2015   Lumbar stenosis with neurogenic claudication 02/23/2015   Hypertension 12/02/2014   Type 2 diabetes mellitus with hyperlipidemia ([place]) 12/02/2014   Benign essential tremor 03/13/2014   Imbalance 03/13/2014   Prostate cancer ([place]) 10/07/2012   ED (erectile dysfunction) of organic origin 02/09/2012   PCP:  Valerie Roys, DO Pharmacy:   [place], [place] [address] Phone: [phone] Fax: [phone]  [id number] - [place], [place] - [address] [place] [address] Phone: [phone] Fax: [place], [place] [address] Phone: [phone] Fax: [phone]     Social Determinants of Health (SDOH) Interventions    Readmission Risk Interventions Readmission Risk Prevention Plan 12/10/2020  Transportation Screening Complete  PCP or Specialist Appt within 3-5 Days  Complete  HRI or Home Care Consult Complete  Social Work Consult for [place] Planning/Counseling Complete  Palliative Care Screening Not Applicable  Medication Review Press photographer) Complete  Some recent data might be hidden

## 2020-12-10 NOTE — Anesthesia Preprocedure Evaluation (Signed)
Anesthesia Evaluation  Patient identified by MRN, date of birth, ID band Patient confused    Reviewed: Allergy & Precautions, NPO status , Patient's Chart, lab work & pertinent test results, Unable to perform ROS - Chart review only  History of Anesthesia Complications Negative for: history of anesthetic complications  Airway Mallampati: III  TM Distance: >3 FB Neck ROM: Full  Mouth opening: Limited Mouth Opening Comment: Not cooperative with airway exam Dental  (+) Poor Dentition, Upper Dentures   Pulmonary neg sleep apnea, pneumonia, unresolved, neg COPD, Patient abstained from smoking.Not current smoker, former smoker,  CXR 12/08/20: Cardiomegaly. Slight asymmetric hazy opacity in the right thorax could reflect asymmetric edema or potential diffuse infection. On 2L/min O2 via [place], saturating 100%, no respiratory distress    + decreased breath sounds      Cardiovascular Exercise Tolerance: Poor METShypertension, + CAD and +CHF  (-) Past MI + dysrhythmias Atrial Fibrillation  Rhythm:Irregular Rate:Tachycardia - Systolic murmurs TTE 7209: 1. Left ventricular ejection fraction, by estimation, is 50 to 55%. The  left ventricle has low normal function. The left ventricle has no regional  wall motion abnormalities. There is moderate left ventricular hypertrophy.  Left ventricular diastolic  parameters are indeterminate.  2. Right ventricular systolic function is normal. The right ventricular  size is normal. There is normal pulmonary artery systolic pressure.  3. Left atrial size was moderately dilated.  4. Mild mitral valve regurgitation.  5. There is dilatation of the ascending aorta measuring 39 mm.    Neuro/Psych PSYCHIATRIC DISORDERS Anxiety Depression CVA    GI/Hepatic GERD  Medicated,(+)     (-) substance abuse  ,   Endo/Other  diabetes  Renal/GU CRFRenal disease     Musculoskeletal   Abdominal   Peds   Hematology  (+) anemia ,   Anesthesia Other Findings Past Medical History: No date: Anxiety No date: Aortic regurgitation     Comment:  a. 03/2017 Echo: Mod AI; b. 05/2017 Echo: Triv AI. No date: Arthritis     Comment:  osteoarthritis-left knee No date: CAD (coronary artery disease)     Comment:  a. Reported h/o cath ~ 2008 - ? small vessel dzs. No PCI              performed; b. 03/2017 MV: EF 45-54%, no ischemia/infarct. No date: Depression No date: Diabetes mellitus without complication ([place]) No date: Diastolic dysfunction     Comment:  a. 03/2017 Echo: EF 55-65%; b. 05/2017 Echo: EF 55-60%,               no rwma, Gr1 DD. Triv AI.  Mildly dil LA. No date: ED (erectile dysfunction) No date: Elevated troponin     Comment:  a. chronic mild elevation - 0.03->0.04. No date: GERD (gastroesophageal reflux disease) No date: Glaucoma No date: Hyperlipidemia No date: Hypertension No date: Iron deficiency anemia due to chronic blood loss No date: Left pontine stroke w/ cerebrovascular disease(HCC)     Comment:  a. 05/2017 MRI/A: acute to subacute L pontine infarct.               Chronic left thalamic lacunar infarct. Severe basilar               artery stenosis w/ radiographic string sign of the distal              1/3. Mild to moderate L PCA P2 segment stenosis; b.  05/2017 Carotid U/S: <50% bilat ICA stenosis. No date: Lumbago     Comment:  Lumbosacral Neuritis No date: Prostate cancer ([place])  Reproductive/Obstetrics                             Anesthesia Physical Anesthesia Plan  ASA: 3  Anesthesia Plan: General   Post-op Pain Management:    Induction: Intravenous  PONV Risk Score and Plan: 2 and Ondansetron, Propofol infusion and TIVA  Airway Management Planned: Nasal Cannula and Natural Airway  Additional Equipment: None  Intra-op Plan:   Post-operative Plan:   Informed Consent: I have reviewed the patients History and  Physical, chart, labs and discussed the procedure including the risks, benefits and alternatives for the proposed anesthesia with the patient or authorized representative who has indicated his/her understanding and acceptance.     Dental advisory given and Consent reviewed with [place] Discussed with: CRNA and Surgeon  Anesthesia Plan Comments: (Discussed risks of anesthesia with daughter [name] via phone (due to patient's mostly nonverbal state), including possibility of difficulty with spontaneous ventilation under anesthesia necessitating airway intervention, PONV, and rare risks such as cardiac or respiratory or neurological events. She understands.)        Anesthesia Quick Evaluation

## 2020-12-10 NOTE — Interval H&P Note (Signed)
History and Physical Interval Note:  12/10/2020 7:21 AM  Todd [name]  has presented today for surgery, with the diagnosis of atrial flutter with rapid ventricular response.  The various methods of treatment have been discussed with the patient and family. After consideration of risks, benefits and other options for treatment, the patient has consented to  Procedure(s): CARDIOVERSION (N/A) as a surgical intervention.  The patient's history has been reviewed, patient examined, no change in status, stable for surgery.  I have reviewed the patient's chart and labs.  Questions were answered to the patient's satisfaction.     Lizanne Erker

## 2020-12-10 NOTE — Progress Notes (Signed)
Patient ID: Todd [name], male   DOB: Feb 23, 1931, 87 y.o.   MRN: [id number] [facility] PROGRESS NOTE  Todd [name] QVZ:[id number] DOB: 22-Apr-1931 DOA: 12/07/2020 PCP: Park Liter P, DO  HPI/Subjective: Patient seen after cardioversion.  Normally does not talk too much today shook his head to a couple questions but was a little sleepy.  Cardioversion today was successful and was in normal sinus rhythm.  Admitted with rapid atrial fibrillation.  Objective: Vitals:   12/10/20 1200 12/10/20 1400  BP: 128/79 129/79  Pulse: 73 87  Resp: 15 18  Temp:    SpO2: 99% 100%    Intake/Output Summary (Last 24 hours) at 12/10/2020 1544 Last data filed at 12/10/2020 1500 Gross per 24 hour  Intake 1919.73 ml  Output 2125 ml  Net -205.27 ml   Filed Weights   12/07/20 1705 12/09/20 0450 12/10/20 0453  Weight: 77.1 kg 86.1 kg 84.2 kg    ROS: Review of Systems  Unable to perform ROS: Acuity of condition  Exam: Physical Exam HENT:     Head: Normocephalic.     Mouth/Throat:     Pharynx: No oropharyngeal exudate.  Eyes:     General: Lids are normal.     Conjunctiva/sclera: Conjunctivae normal.  Cardiovascular:     Rate and Rhythm: Normal rate and regular rhythm.     Heart sounds: Normal heart sounds, S1 normal and S2 normal.  Pulmonary:     Breath sounds: Examination of the right-lower field reveals decreased breath sounds. Examination of the left-lower field reveals decreased breath sounds. Decreased breath sounds present. No wheezing, rhonchi or rales.  Abdominal:     Palpations: Abdomen is soft.     Tenderness: There is no abdominal tenderness.  Musculoskeletal:     Right ankle: Swelling present.     Left ankle: Swelling present.  Skin:    General: Skin is warm.     Findings: No rash.  Neurological:     Mental Status: He is alert and oriented to person, place, and time.     Data Reviewed: Basic Metabolic Panel: Recent Labs  Lab 12/07/20 1719 12/08/20 0657 12/08/20 2307  12/09/20 0204 12/10/20 0559  NA 139 141  --  137  --   K 4.1 3.8  --  4.6  --   CL 103 107  --  100  --   CO2 29 23  --  24  --   GLUCOSE 202* 184*  --  216*  --   BUN 24* 24*  --  29*  --   CREATININE 1.40* 1.35*  --  1.70*  --   CALCIUM 8.5* 8.3*  --  8.5*  --   MG  --  1.4* 1.9 2.0 1.9   Liver Function Tests: Recent Labs  Lab 12/07/20 1719  AST 20  ALT <5  ALKPHOS 52  BILITOT 0.6  PROT 6.7  ALBUMIN 3.7   CBC: Recent Labs  Lab 12/07/20 1719 12/08/20 0657  WBC 7.1 7.0  NEUTROABS 4.4  --   HGB 9.9* 9.8*  HCT 31.3* 30.8*  MCV 99.1 97.2  PLT 199 183    BNP (last 3 results) Recent Labs    12/08/20 0657  BNP 699.5*     CBG: Recent Labs  Lab 12/09/20 1116 12/09/20 1556 12/09/20 2106 12/10/20 0844 12/10/20 1118  GLUCAP 192* 211* 198* 132* 197*    Recent Results (from the past 240 hour(s))  Resp Panel by RT-PCR (Flu A&B, Covid) Nasopharyngeal Swab  Status: None   Collection Time: 12/07/20  8:40 PM   Specimen: Nasopharyngeal Swab; Nasopharyngeal(NP) swabs in vial transport medium  Result Value Ref Range Status   SARS Coronavirus 2 by RT PCR NEGATIVE NEGATIVE Final    Comment: (NOTE) SARS-CoV-2 target nucleic acids are NOT DETECTED.  The SARS-CoV-2 RNA is generally detectable in upper respiratory specimens during the acute phase of infection. The lowest concentration of SARS-CoV-2 viral copies this assay can detect is 138 copies/mL. A negative result does not preclude SARS-Cov-2 infection and should not be used as the sole basis for treatment or other patient management decisions. A negative result may occur with  improper specimen collection/handling, submission of specimen other than nasopharyngeal swab, presence of viral mutation(s) within the areas targeted by this assay, and inadequate number of viral copies(<138 copies/mL). A negative result must be combined with clinical observations, patient history, and epidemiological information. The  expected result is Negative.  Fact Sheet for Patients:  EntrepreneurPulse.com.au  Fact Sheet for Healthcare Providers:  IncredibleEmployment.be  This test is no t yet approved or cleared by the [place] FDA and  has been authorized for detection and/or diagnosis of SARS-CoV-2 by FDA under an Emergency Use Authorization (EUA). This EUA will remain  in effect (meaning this test can be used) for the duration of the COVID-19 declaration under Section 564(b)(1) of the Act, 21 U.S.C.section 360bbb-3(b)(1), unless the authorization is terminated  or revoked sooner.       Influenza A by PCR NEGATIVE NEGATIVE Final   Influenza B by PCR NEGATIVE NEGATIVE Final    Comment: (NOTE) The Xpert Xpress SARS-CoV-2/FLU/RSV plus assay is intended as an aid in the diagnosis of influenza from Nasopharyngeal swab specimens and should not be used as a sole basis for treatment. Nasal washings and aspirates are unacceptable for Xpert Xpress SARS-CoV-2/FLU/RSV testing.  Fact Sheet for Patients: EntrepreneurPulse.com.au  Fact Sheet for Healthcare Providers: IncredibleEmployment.be  This test is not yet approved or cleared by the [place] FDA and has been authorized for detection and/or diagnosis of SARS-CoV-2 by FDA under an Emergency Use Authorization (EUA). This EUA will remain in effect (meaning this test can be used) for the duration of the COVID-19 declaration under Section 564(b)(1) of the Act, 21 U.S.C. section 360bbb-3(b)(1), unless the authorization is terminated or revoked.  Performed at [facility], [address] [place], [place] [address]   Culture, blood (Routine X 2) w Reflex to ID Panel     Status: None (Preliminary result)   Collection Time: 12/08/20  7:04 AM   Specimen: BLOOD  Result Value Ref Range Status   Specimen Description BLOOD LEFT ANTECUBITAL  Final   Special Requests   Final     BOTTLES DRAWN AEROBIC AND ANAEROBIC Blood Culture adequate volume   Culture   Final    NO GROWTH 2 DAYS Performed at [facility], [address], [place], [place] [address]    Report Status PENDING  Incomplete  Culture, blood (Routine X 2) w Reflex to ID Panel     Status: None (Preliminary result)   Collection Time: 12/08/20  7:05 AM   Specimen: BLOOD  Result Value Ref Range Status   Specimen Description BLOOD BLOOD LEFT HAND  Final   Special Requests   Final    BOTTLES DRAWN AEROBIC AND ANAEROBIC Blood Culture adequate volume   Culture   Final    NO GROWTH 2 DAYS Performed at [facility], [place], [place]  [address]    Report Status PENDING  Incomplete  MRSA Next Gen by PCR, Nasal     Status: None   Collection Time: 12/09/20 12:17 AM   Specimen: Nasal Mucosa; Nasal Swab  Result Value Ref Range Status   MRSA by PCR Next Gen NOT DETECTED NOT DETECTED Final    Comment: (NOTE) The GeneXpert MRSA Assay (FDA approved for NASAL specimens only), is one component of a comprehensive MRSA colonization surveillance program. It is not intended to diagnose MRSA infection nor to guide or monitor treatment for MRSA infections. Test performance is not FDA approved in patients less than 28 years old. Performed at [facility], [address] [place], [place] [address]      Studies: DG Chest 1 View  Result Date: 12/08/2020 CLINICAL DATA:  Shortness of breath EXAM: CHEST  1 VIEW COMPARISON:  12/07/2020, 11/11/2018 FINDINGS: Cardiomegaly. Elevation of right diaphragm. Mild asymmetric hazy opacity in the right thorax. No pleural effusion or pneumothorax. IMPRESSION: Cardiomegaly. Slight asymmetric hazy opacity in the right thorax could reflect asymmetric edema or potential diffuse infection. Electronically Signed   By: Donavan Foil M.D.   On: 12/08/2020 22:57    Scheduled Meds:  apixaban  5 mg Oral BID   atorvastatin  20 mg Oral Daily    carbidopa-levodopa  1 tablet Oral TID   Chlorhexidine Gluconate Cloth  6 each Topical Q0600   doxycycline  100 mg Oral Q12H   DULoxetine  30 mg Oral Daily   insulin aspart  0-9 Units Subcutaneous TID WC   insulin glargine  6 Units Subcutaneous QHS   latanoprost  1 drop Both Eyes QHS   magnesium oxide  400 mg Oral BID   metoprolol tartrate  25 mg Oral BID   polyethylene glycol  17 g Oral Daily   sodium chloride flush  3 mL Intravenous Q12H   Continuous Infusions:  sodium chloride Stopped (12/09/20 1234)   amiodarone 30 mg/hr (12/10/20 1500)   cefTRIAXone (ROCEPHIN)  IV Stopped (12/10/20 0859)    Assessment/Plan:  Paroxysmal atrial fibrillation with rapid ventricular response.  Patient cardioverted today into normal sinus rhythm.  Cardiology would like to keep amiodarone drip going today and hopefully convert over to oral tomorrow.  Continue metoprolol.  Continue Eliquis for anticoagulation. Right lower lobe pneumonia.  Continue Rocephin and doxycycline Acute kidney injury after Lasix given.  Creatinine 1.35 and went up to 1.7. Acute metabolic encephalopathy.  Only medications if agitated. Hypomagnesemia.  Replaced during the hospital course Prior history of ischemic stroke with baseline right-sided weakness Type 2 diabetes mellitus with hyperlipidemia.  On atorvastatin.  Holding Jardiance and metformin while here in the hospital.  Using low-dose Lantus insulin instead Lactic acidosis.  Holding Glucophage Anemia of chronic disease.  Last hemoglobin 9.8 Depression anxiety on Cymbalta Chronic respiratory failure on 4 L of oxygen Weakness.  Continue PT evaluation        Code Status:     Code Status Orders  (From admission, onward)           Start     Ordered   12/07/20 2334  Full code  Continuous        12/07/20 2335           Code Status History     Date Active Date Inactive Code Status Order ID Comments User Context   11/11/2018 1817 11/17/2018 1950 Full Code  [id number]  Gorden Harms, MD Inpatient   04/20/2018 2122 04/23/2018 1849 Full Code  [id number]  Gladstone Lighter, MD Inpatient   05/18/2017 0303 05/21/2017 1928 Full Code [id number]  Lance Coon, MD Inpatient   03/17/2017 0419 03/17/2017 2109 Full Code [id number]  Saundra Shelling, MD ED   03/05/2015 0019 03/05/2015 1507 Full Code [id number]  Lance Coon, MD Inpatient      Family Communication: Daughter at the bedside Disposition Plan: Status is: Inpatient  Dispo: The patient is from: Home              Anticipated d/c is to: Home versus rehab              Patient currently cardioverted today.  On antibiotics for pneumonia.   Difficult to place patient.  Hopefully not  Consultants: Cardiology  Procedures: Cardioversion  Antibiotics: Rocephin Doxycycline  Time spent: 26 minutes  [place]

## 2020-12-10 NOTE — Progress Notes (Signed)
Progress Note  Patient Name: Todd [name] Date of Encounter: 12/10/2020  [facility] Cardiologist: Ida Rogue, MD   Subjective   Patient unable to verbalize but appears comfortable.  He underwent successful cardioversion this morning.  Inpatient Medications    Scheduled Meds:  apixaban  5 mg Oral BID   atorvastatin  20 mg Oral Daily   carbidopa-levodopa  1 tablet Oral TID   Chlorhexidine Gluconate Cloth  6 each Topical Q0600   doxycycline  100 mg Oral Q12H   DULoxetine  30 mg Oral Daily   insulin aspart  0-9 Units Subcutaneous TID WC   insulin glargine  6 Units Subcutaneous QHS   latanoprost  1 drop Both Eyes QHS   magnesium oxide  400 mg Oral BID   metoprolol tartrate  25 mg Oral BID   polyethylene glycol  17 g Oral Daily   sodium chloride flush  3 mL Intravenous Q12H   Continuous Infusions:  sodium chloride Stopped (12/09/20 1234)   amiodarone 30 mg/hr (12/10/20 1800)   cefTRIAXone (ROCEPHIN)  IV Stopped (12/10/20 0859)   PRN Meds: acetaminophen **OR** acetaminophen, bisacodyl, ipratropium-albuterol, ondansetron **OR** ondansetron (ZOFRAN) IV, QUEtiapine, risperiDONE   Vital Signs    Vitals:   12/10/20 1200 12/10/20 1400 12/10/20 1600 12/10/20 1800  BP: 128/79 129/79 138/69 139/87  Pulse: 73 87 72 86  Resp: 15 18 20 20   Temp:   98.8 F (37.1 C)   TempSrc:   Oral   SpO2: 99% 100% 96% 98%  Weight:      Height:        Intake/Output Summary (Last 24 hours) at 12/10/2020 1838 Last data filed at 12/10/2020 1800 Gross per 24 hour  Intake 1694.47 ml  Output 1425 ml  Net 269.47 ml   Last 3 Weights 12/10/2020 12/09/2020 12/07/2020  Weight (lbs) 185 lb 10 oz 189 lb 13.1 oz 170 lb  Weight (kg) 84.2 kg 86.1 kg 77.111 kg      Telemetry    Following cardioversion, he has been in sinus rhythm with PVCs. - Personally Reviewed  ECG    Post cardioversion: Normal sinus rhythm with left axis deviation, poor R wave progression, and inferior infarct. - Personally  Reviewed  Physical Exam   GEN: No acute distress.   Neck: No JVD Cardiac: RRR, no murmurs, rubs, or gallops.  Respiratory: Mildly diminished breath sounds throughout without wheezes or crackles. GI: Soft, nontender, non-distended  MS: No edema; No deformity. Neuro:  Nonfocal  Psych: Normal affect   Labs    High Sensitivity Troponin:   Recent Labs  Lab 12/07/20 1719 12/07/20 1955 12/08/20 2307 12/09/20 0204  TROPONINIHS 95* 96* 127* 149*      Chemistry Recent Labs  Lab 12/07/20 1719 12/08/20 0657 12/09/20 0204  NA 139 141 137  K 4.1 3.8 4.6  CL 103 107 100  CO2 29 23 24   GLUCOSE 202* 184* 216*  BUN 24* 24* 29*  CREATININE 1.40* 1.35* 1.70*  CALCIUM 8.5* 8.3* 8.5*  PROT 6.7  --   --   ALBUMIN 3.7  --   --   AST 20  --   --   ALT <5  --   --   ALKPHOS 52  --   --   BILITOT 0.6  --   --   GFRNONAA 48* 50* 38*  ANIONGAP 7 11 13      Hematology Recent Labs  Lab 12/07/20 1719 12/08/20 0657  WBC 7.1 7.0  RBC 3.16* 3.17*  HGB 9.9* 9.8*  HCT 31.3* 30.8*  MCV 99.1 97.2  MCH 31.3 30.9  MCHC 31.6 31.8  RDW 12.8 12.8  PLT 199 183    BNP Recent Labs  Lab 12/08/20 0657  BNP 699.5*     DDimer No results for input(s): DDIMER in the last 168 hours.   Radiology    DG Chest 1 View  Result Date: 12/08/2020 CLINICAL DATA:  Shortness of breath EXAM: CHEST  1 VIEW COMPARISON:  12/07/2020, 11/11/2018 FINDINGS: Cardiomegaly. Elevation of right diaphragm. Mild asymmetric hazy opacity in the right thorax. No pleural effusion or pneumothorax. IMPRESSION: Cardiomegaly. Slight asymmetric hazy opacity in the right thorax could reflect asymmetric edema or potential diffuse infection. Electronically Signed   By: Donavan Foil M.D.   On: 12/08/2020 22:57    Cardiac Studies   TTE (11/18/2019): 1. Left ventricular ejection fraction, by estimation, is 50 to 55%. The  left ventricle has low normal function. The left ventricle has no regional  wall motion abnormalities. There  is moderate left ventricular hypertrophy.  Left ventricular diastolic  parameters are indeterminate.   2. Right ventricular systolic function is normal. The right ventricular  size is normal. There is normal pulmonary artery systolic pressure.   3. Left atrial size was moderately dilated.   4. Mild mitral valve regurgitation.   5. There is dilatation of the ascending aorta measuring 39 mm.   Patient Profile     87 y.o. 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  Assessment & Plan    Atrial fibrillation/flutter: Patient underwent successful cardioversion from atrial flutter to sinus rhythm this morning.  He is maintaining sinus rhythm. Continue amiodarone infusion overnight.  If he remains in sinus rhythm tomorrow, consider transitioning to oral amiodarone to complete 10 g load. Continue apixaban 5 mg twice daily. Continue metoprolol tartrate 25 mg twice daily.  Acute on chronic respiratory failure with hypoxia: Patient does not appear volume overloaded on exam.  Last echo a year ago showed low normal LVEF. Repeat echocardiogram: Wean oxygen, as tolerated. Continue ceftriaxone and doxycycline for possible right lower lobe pneumonia per internal medicine.  Elevated troponin and coronary artery disease: Suspect elevated troponin reflects demand ischemia.  EKG without acute ischemic changes but evidence of prior anterolateral and inferior infarct. Repeat echocardiogram. Continue atorvastatin for secondary prevention. Defer ischemia unless there is new cardiomyopathy with wall motion abnormality, given the patient's comorbidities.  Acute kidney injury: Potentially  due to intravascular volume depletion.  Patient does not appear volume overloaded today. Continue to hold diuretics. Avoid nephrotoxic drugs.   For questions or updates, please contact [place] Please consult [email or web address] for contact info under [facility] Cardiology.     Signed, Nelva Bush, MD  12/10/2020, 6:38 PM

## 2020-12-10 NOTE — Progress Notes (Addendum)
Physical Therapy Treatment Patient Details Name: Todd [name] MRN: [id number] DOB: 1931/05/10 Today's Date: 12/10/2020    History of Present Illness Todd [name] is an 53yoM who comes [facility] on 09/07/20 c fatigue, weakness, anorrexia. 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 PMH: PAF on eliquis and digoxin, CAD, CVA c Rt hemiplegia, CKD3a, DM2, HTN, HLD, chronic anemia,    PT Comments    Pt moved to ICU, now s/p DC cardioversion this morning, pt alert, VSS. Pt assisted with AA/ROM in bed, mostly for tactile cues and attending to task. Pt responds well, had lots of stiff knee pain at eval 2 days prior, but today, no knee pain with transfers. Pt still moving fairly close to baseline at present, but difficult to replicate due to variable equipment setup at home v here. Pt uses a lift chair at home, but here struggles with standing from lower surfaces. DTR [name] in room for latter part of session. Still think a transport chair will help with caregiver assistance at DC. Would benefit from HHPT to maximize potential in mobility and strength.   Follow Up Recommendations  Home health PT;Supervision for mobility/OOB     Equipment Recommendations  Other (comment) (transport chair for in-home mobility assist)    Recommendations for Other Services       Precautions / Restrictions Precautions Precautions: Fall Restrictions Weight Bearing Restrictions: No    Mobility  Bed Mobility Overal bed mobility: Needs Assistance Bed Mobility: Supine to Sit     Supine to sit: Min assist     General bed mobility comments: good trunk control sitting EOB    Transfers Overall transfer level: Needs assistance Equipment used: 1 person hand held assist Transfers: Stand Pivot Transfers   Stand pivot transfers: Mod assist       General transfer comment: not able to get trunk in optimal postiion, does not take steps during slow dpeendent pivot  Ambulation/Gait                 Stairs              Wheelchair Mobility    Modified Rankin (Stroke Patients Only)       Balance Overall balance assessment: Modified Independent;Mild deficits observed, not formally tested;Needs assistance                                          Cognition Arousal/Alertness: Awake/alert Behavior During Therapy: WFL for tasks assessed/performed Overall Cognitive Status: Within Functional Limits for tasks assessed                                        Exercises General Exercises - Lower Extremity Short Arc Quad: AROM;Both;10 reps;Supine Heel Slides: AAROM;Both;15 reps;Supine Hip ABduction/ADduction: AAROM;Both;15 reps;Supine    General Comments        Pertinent Vitals/Pain Pain Assessment: No/denies pain    Home Living                      Prior Function            PT Goals (current goals can now be found in the care plan section) Acute Rehab PT Goals Patient Stated Goal: avoid decline while admitted PT Goal Formulation: With patient Time For Goal Achievement: 12/22/20 Potential to Achieve  Goals: Fair Progress towards PT goals: Progressing toward goals    Frequency    Min 2X/week      PT Plan Current plan remains appropriate    Co-evaluation              AM-PAC PT "6 Clicks" Mobility   Outcome Measure  Help needed turning from your back to your side while in a flat bed without using bedrails?: A Little Help needed moving from lying on your back to sitting on the side of a flat bed without using bedrails?: A Little Help needed moving to and from a bed to a chair (including a wheelchair)?: Total Help needed standing up from a chair using your arms (e.g., wheelchair or bedside chair)?: Total Help needed to walk in hospital room?: Total Help needed climbing 3-5 steps with a railing? : Total 6 Click Score: 10    End of Session Equipment Utilized During Treatment: Oxygen Activity Tolerance: Patient tolerated  treatment well;Patient limited by fatigue;Patient limited by pain Patient left: in bed;with family/visitor present;with call bell/phone within reach;with bed alarm set Nurse Communication: Mobility status PT Visit Diagnosis: Difficulty in walking, not elsewhere classified (R26.2);Other abnormalities of gait and mobility (R26.89);Muscle weakness (generalized) (M62.81)     Time: 1040-1101 PT Time Calculation (min) (ACUTE ONLY): 21 min  Charges:  $Therapeutic Exercise: 8-22 mins                    1:48 PM, 12/10/20 Etta Grandchild, PT, DPT Physical Therapist - [facility]  [phone] ([place])      [place] C 12/10/2020, 1:44 PM

## 2020-12-10 NOTE — Anesthesia Postprocedure Evaluation (Signed)
Anesthesia Post Note  Patient: Todd [name]  Procedure(s) Performed: CARDIOVERSION  Patient location during evaluation: Specials Recovery Anesthesia Type: General Level of consciousness: awake and confused (confused as per preop assessment) Pain management: pain level controlled Vital Signs Assessment: post-procedure vital signs reviewed and stable Respiratory status: spontaneous breathing, nonlabored ventilation, respiratory function stable and patient connected to nasal cannula oxygen Cardiovascular status: blood pressure returned to baseline and stable Postop Assessment: no apparent nausea or vomiting Anesthetic complications: no   No notable events documented.   Last Vitals:  Vitals:   12/10/20 0745 12/10/20 0821  BP: 109/62   Pulse: 70   Resp: 18   Temp:  36.8 C  SpO2: 98%     Last Pain:  Vitals:   12/10/20 0821  TempSrc: Axillary  PainSc: 0-No pain                 Arita Miss

## 2020-12-10 NOTE — Evaluation (Signed)
Occupational Therapy Evaluation Patient Details Name: Todd [name] MRN: [id number] DOB: Aug 06, 1930 Today's Date: 12/10/2020    History of Present Illness Todd [name] is an 83yoM who comes to [facility] on 09/07/20 c fatigue, weakness, anorrexia. 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 PMH: PAF on eliquis and digoxin, CAD, CVA c Rt hemiplegia, CKD3a, DM2, HTN, HLD, chronic anemia. Pt adm for Afib with RVR. T/f to SDU on 12/08/20 for closer monitoring d/t increasd HR and increased O2 needs. Pt now s/p cardioversion and is in NSR.   Clinical Impression   Pt seen for OT evaluation this date in setting of acute hospitalization d/t Afib with RVR. Pt's dtr present throughout session and reports that at baseline, pt is able to perform fxl mobility with AD, requires some assist for LB ADLs such as LB dressing and peri care after BM, but was otherwise able to perform basic self care such as grooming and self-feeding in sitting. Pt presents this date with increased weakness and decreased fxl activity tolerance. Pt requires MAX A +2 to t/f to commode SPS with arm in arm technique bilaterally and knee blocking. Requires MAX A +2 to perform STS with RW from [facility] and then take ~4-5 small steps from commode to EOB. Pt requires MIN A +2 to perform sit to supine transition. On ADL assessment, pt requiring MAX A for seated LB ADLs and MOD A for seated UB ADLs (no ADLs attempted in sitting 2/2 poor standing balance and tolerance). Pt returned to bed with all needs met and in reach. His second dtr presents at end of session. Education completed with both family members re: role of OT. Pt RN at bedside assisting with line mgt as well. Will continue to follow acutely. Anticipate pt will require STR f/u efforts to improve transfers and mobility to safe level to return home with family assistance.     Follow Up Recommendations  SNF    Equipment Recommendations  3 in 1 bedside commode;Tub/shower seat    Recommendations for  Other Services       Precautions / Restrictions Precautions Precautions: Fall Restrictions Weight Bearing Restrictions: No      Mobility Bed Mobility Overal bed mobility: Needs Assistance Bed Mobility: Sit to Supine     Supine to sit: Min assist Sit to supine: Min assist;+2 for physical assistance;+2 for safety/equipment   General bed mobility comments: requires assist for trunk and LEs for back to bed    Transfers Overall transfer level: Needs assistance Equipment used: Rolling walker (2 wheeled);2 person hand held assist Transfers: Sit to/from [facility] Sit to Stand: Max assist;+2 physical assistance;+2 safety/equipment Stand pivot transfers: Max assist;+2 physical assistance;+2 safety/equipment       General transfer comment: MAX A +2 to SPS from recliner d/t lower seated surface. Requires tactlie/verbal moderate cueing for hand placement. Attempted both with RW and then with arm in arm for SPS transfers as pt with diffiuclty sustaining his foot placement (sliding forward and pt unable to adjust/being the feet further under him in prep for transfers)    Balance Overall balance assessment: Needs assistance Sitting-balance support: Feet supported Sitting balance-Leahy Scale: Fair       Standing balance-Leahy Scale: Zero Standing balance comment: requires MOD A +2 to sustain static standing with BUE support                           ADL either performed or assessed with clinical judgement  ADL Overall ADL's : Needs assistance/impaired                                       General ADL Comments: MOD A for seated UB ADLs such as feeding, grooming and dressing, requries MAX A for LB ADLs. Requires MAX A +2 for t/f from recliner d/t lower level.     Vision Patient Visual Report: No change from baseline Additional Comments: difficult to formally assess, but he generally tracks appropraitely.     Perception     Praxis       Pertinent Vitals/Pain Pain Assessment: No/denies pain     Hand Dominance Left   Extremity/Trunk Assessment Upper Extremity Assessment Upper Extremity Assessment: Generalized weakness (decreased digit ROM on L side which is his dominant side. generally decreased shld AROM, difficult to formally assess as pt appears to have some receptive aphasia as well or general cognitive decline as he requires increased time to process simple cues)   Lower Extremity Assessment Lower Extremity Assessment: Generalized weakness       Communication Communication Communication: Expressive difficulties (chronic 2/2 CVA)   Cognition Arousal/Alertness: Awake/alert Behavior During Therapy: WFL for tasks assessed/performed Overall Cognitive Status: Within Functional Limits for tasks assessed                                     General Comments       Exercises General Exercises - Lower Extremity Short Arc Quad: AROM;Both;10 reps;Supine Heel Slides: AAROM;Both;15 reps;Supine Hip ABduction/ADduction: AAROM;Both;15 reps;Supine Other Exercises Other Exercises: OT ed with pt and family re: role, importance of OOB activity, benefits of OT particiaption, safety with ADL transfer including hand/foot placement. Pt attending for education, but difficult to ascertain receptiveness, pt's family receptive and with good carryover (2 daughters)   Shoulder Instructions      Home Living Family/patient expects to be discharged to:: Private residence Living Arrangements: Children (Son gerald and Son's wife) Available Help at Discharge: Available 24 hours/day Type of Home: House Home Access: Ramped entrance     Home Layout: One level               Home Equipment: Wheelchair - [employer] - 2 wheels;Walker - 4 wheels;Other (comment) (lift chair)          Prior Functioning/Environment Level of Independence: Needs assistance  Gait / Transfers Assistance Needed: limited household  distance AMB c RW; ADL's / Homemaking Assistance Needed: Able to perform bed mobility at night ad lib to use a urinal; for weakness/safety requires assit with transfers. Pt able to assist with seated UB ADLs such as donning shirt, but requries MAX to TOTAL A from family members for LB ADLs such as donning socks/shoes.            OT Problem List: Decreased strength;Decreased range of motion;Decreased activity tolerance;Impaired balance (sitting and/or standing);Decreased knowledge of use of DME or AE      OT Treatment/Interventions: Self-care/ADL training;DME and/or AE instruction;Therapeutic activities;Balance training;Therapeutic exercise;Neuromuscular education;Patient/family education    OT Goals(Current goals can be found in the care plan section) Acute Rehab OT Goals Patient Stated Goal: avoid decline while admitted OT Goal Formulation: With family Time For Goal Achievement: 12/24/20 Potential to Achieve Goals: Fair ADL Goals Pt Will Perform Grooming: with set-up;sitting Pt Will Transfer to Toilet: with min assist;with mod  assist;ambulating (with RW, ~10-15' to improve tolerance for fxl HH distance.) Pt Will Perform Toileting - Clothing Manipulation and hygiene: with min assist;with mod assist;sit to/from stand  OT Frequency: Min 2X/week   Barriers to D/C:            Co-evaluation              AM-PAC OT "6 Clicks" Daily Activity     Outcome Measure Help from another person eating meals?: A Little Help from another person taking care of personal grooming?: A Lot Help from another person toileting, which includes using toliet, bedpan, or urinal?: Total Help from another person bathing (including washing, rinsing, drying)?: A Lot Help from another person to put on and taking off regular upper body clothing?: A Lot Help from another person to put on and taking off regular lower body clothing?: Total 6 Click Score: 11   End of Session Equipment Utilized During Treatment:  Gait belt;Rolling walker;Oxygen (needs his shoes on) Nurse Communication: Mobility status  Activity Tolerance: Patient tolerated treatment well Patient left: in bed;with call bell/phone within reach;with bed alarm set  OT Visit Diagnosis: Unsteadiness on feet (R26.81);Muscle weakness (generalized) (M62.81);Cognitive communication deficit (R41.841);Other symptoms and signs involving the nervous system (R29.898) Symptoms and signs involving cognitive functions: Cerebral infarction                Time: 0149-9692 OT Time Calculation (min): 37 min Charges:  OT General Charges $OT Visit: 1 Visit OT Evaluation $OT Eval Moderate Complexity: 1 Mod OT Treatments $Self Care/Home Management : 8-22 mins $Therapeutic Activity: 8-22 mins  Gerrianne Scale, MS, OTR/L ascom [phone] 12/10/20, 5:42 PM

## 2020-12-10 NOTE — Transfer of Care (Signed)
Immediate Anesthesia Transfer of Care Note  Patient: Todd [name]  Procedure(s) Performed: CARDIOVERSION  Patient Location: spu  Anesthesia Type:General  Level of Consciousness: drowsy  Airway & Oxygen Therapy: Patient Spontanous Breathing and Patient connected to nasal cannula oxygen  Post-op Assessment: Report given to RN and Post -op Vital signs reviewed and stable  Post vital signs: Reviewed  Last Vitals:  Vitals Value Taken Time  BP 92/65 12/10/20 0739  Temp    Pulse 71 12/10/20 0739  Resp 18 12/10/20 0739  SpO2 99 % 12/10/20 0739  Vitals shown include unvalidated device data.  Last Pain:  Vitals:   12/10/20 0716  TempSrc:   PainSc: 0-No pain      Patients Stated Pain Goal: 0 (14/97/02 6378)  Complications: No notable events documented.

## 2020-12-10 NOTE — CV Procedure (Signed)
    Cardioversion Note  Todd [name] [id number] 12/07/1930  Procedure: DC Cardioversion Indications: Atrial fibrillation/flutter  Procedure Details Consent: Obtained Time Out: Verified patient identification, verified procedure, site/side was marked, verified correct patient position, special equipment/implants available, Radiology Safety Procedures followed,  medications/allergies/relevent history reviewed, required imaging and test results available.  Performed  The patient has been on adequate anticoagulation.  The patient received IV propofol by anesthesia for sedation.  Synchronous cardioversion was performed at 50 joules x 1.  The cardioversion was successful with conversion from atrial flutter to normal sinus rhythm.  Complications: No apparent complications Patient did tolerate procedure well.  Nelva Bush., MD 12/10/2020, 7:35 AM

## 2020-12-11 ENCOUNTER — Inpatient Hospital Stay
Admit: 2020-12-11 | Discharge: 2020-12-11 | Disposition: A | Discharge status: Home / Self Care | Payer: HMO | Attending: Internal Medicine | Admitting: Internal Medicine

## 2020-12-11 DIAGNOSIS — R079 Chest pain, unspecified: Secondary | ICD-10-CM

## 2020-12-11 DIAGNOSIS — R5383 Other fatigue: Secondary | ICD-10-CM

## 2020-12-11 DIAGNOSIS — R062 Wheezing: Secondary | ICD-10-CM

## 2020-12-11 LAB — ECHOCARDIOGRAM COMPLETE
AR max vel: 1.66 cm2
AV Peak grad: 4.5 mmHg
Ao pk vel: 1.06 m/s
Area-P 1/2: 3.02 cm2
Calc EF: 51.1 %
Height: 68 in
S' Lateral: 3.31 cm
Single Plane A2C EF: 53 %
Single Plane A4C EF: 46.4 %
Weight: 2970.04 oz

## 2020-12-11 LAB — CBC
HCT: 27.2 % — ABNORMAL LOW (ref 39.0–52.0)
Hemoglobin: 8.7 g/dL — ABNORMAL LOW (ref 13.0–17.0)
MCH: 30.6 pg (ref 26.0–34.0)
MCHC: 32 g/dL (ref 30.0–36.0)
MCV: 95.8 fL (ref 80.0–100.0)
Platelets: 175 10*3/uL (ref 150–400)
RBC: 2.84 MIL/uL — ABNORMAL LOW (ref 4.22–5.81)
RDW: 12.9 % (ref 11.5–15.5)
WBC: 7 10*3/uL (ref 4.0–10.5)
nRBC: 0 % (ref 0.0–0.2)

## 2020-12-11 LAB — BASIC METABOLIC PANEL
Anion gap: 8 (ref 5–15)
BUN: 35 mg/dL — ABNORMAL HIGH (ref 8–23)
CO2: 26 mmol/L (ref 22–32)
Calcium: 8.4 mg/dL — ABNORMAL LOW (ref 8.9–10.3)
Chloride: 103 mmol/L (ref 98–111)
Creatinine, Ser: 1.32 mg/dL — ABNORMAL HIGH (ref 0.61–1.24)
GFR, Estimated: 52 mL/min — ABNORMAL LOW (ref >60)
Glucose, Bld: 158 mg/dL — ABNORMAL HIGH (ref 70–99)
Potassium: 3.8 mmol/L (ref 3.5–5.1)
Sodium: 137 mmol/L (ref 135–145)

## 2020-12-11 LAB — GLUCOSE, CAPILLARY
Glucose-Capillary: 171 mg/dL — ABNORMAL HIGH (ref 70–99)
Glucose-Capillary: 80 mg/dL (ref 70–99)
Glucose-Capillary: 149 mg/dL — ABNORMAL HIGH (ref 70–99)
Glucose-Capillary: 206 mg/dL — ABNORMAL HIGH (ref 70–99)
Glucose-Capillary: 223 mg/dL — ABNORMAL HIGH (ref 70–99)

## 2020-12-11 LAB — MAGNESIUM: Magnesium: 2 mg/dL (ref 1.7–2.4)

## 2020-12-11 MED ORDER — LACTULOSE 10 GM/15ML PO SOLN: 30.0000 g | Freq: Two times a day (BID) | ORAL | Status: DC | PRN

## 2020-12-11 MED ORDER — TRAZODONE HCL 50 MG PO TABS
50.0000 mg | ORAL_TABLET | Freq: Every day | ORAL | Status: DC
  Administered 2020-12-11: 50 mg via ORAL
  Filled 2020-12-11: qty 1

## 2020-12-11 MED ORDER — IPRATROPIUM-ALBUTEROL 0.5-2.5 (3) MG/3ML IN SOLN
3.0000 mL | Freq: Three times a day (TID) | RESPIRATORY_TRACT | Status: DC
  Administered 2020-12-11 – 2020-12-16 (×14): 3 mL via RESPIRATORY_TRACT
  Filled 2020-12-11 (×14): qty 3

## 2020-12-11 MED ORDER — AMIODARONE HCL 200 MG PO TABS
400.0000 mg | ORAL_TABLET | Freq: Two times a day (BID) | ORAL | Status: AC
  Administered 2020-12-11 – 2020-12-16 (×10): 400 mg via ORAL
  Filled 2020-12-11 (×10): qty 2

## 2020-12-11 MED ORDER — LACTULOSE 10 GM/15ML PO SOLN: 30.0000 g | Freq: Two times a day (BID) | ORAL | Status: DC

## 2020-12-11 MED ORDER — BUDESONIDE 0.5 MG/2ML IN SUSP
0.5000 mg | Freq: Two times a day (BID) | RESPIRATORY_TRACT | Status: DC
  Administered 2020-12-11 – 2020-12-16 (×10): 0.5 mg via RESPIRATORY_TRACT
  Filled 2020-12-11 (×10): qty 2

## 2020-12-11 MED ORDER — IPRATROPIUM-ALBUTEROL 0.5-2.5 (3) MG/3ML IN SOLN: 3.0000 mL | Freq: Three times a day (TID) | RESPIRATORY_TRACT | Status: DC

## 2020-12-11 NOTE — Progress Notes (Signed)
Patient ID: Todd [name], male   DOB: 24-Oct-1930, 87 y.o.   MRN: [id number] [facility] PROGRESS NOTE  Elisah Parmer INO:[id number] DOB: 1930-12-19 DOA: 12/07/2020 PCP: Park Liter P, DO  HPI/Subjective: Patient did not sleep well last night and was given Seroquel.  A little lethargic this morning.  Upper airway wheeze.  Admitted with altered mental status and found to be in rapid atrial fibrillation and later diagnosed with pneumonia.  Objective: Vitals:   12/11/20 1100 12/11/20 1200  BP: 131/69 125/63  Pulse: 66 79  Resp:    Temp:    SpO2: 100% 93%    Intake/Output Summary (Last 24 hours) at 12/11/2020 1302 Last data filed at 12/11/2020 0800 Gross per 24 hour  Intake 560.6 ml  Output 2050 ml  Net -1489.4 ml   Filed Weights   12/09/20 0450 12/10/20 0453 12/11/20 0500  Weight: 86.1 kg 84.2 kg 84.2 kg    ROS: Review of Systems  Unable to perform ROS: Acuity of condition  Exam: Physical Exam HENT:     Head: Normocephalic.     Mouth/Throat:     Pharynx: No oropharyngeal exudate.  Eyes:     General: Lids are normal.     Conjunctiva/sclera: Conjunctivae normal.  Cardiovascular:     Rate and Rhythm: Normal rate and regular rhythm.     Heart sounds: Normal heart sounds, S1 normal and S2 normal.  Pulmonary:     Breath sounds: Transmitted upper airway sounds present. Examination of the right-lower field reveals decreased breath sounds. Examination of the left-lower field reveals decreased breath sounds. Decreased breath sounds present. No wheezing, rhonchi or rales.     Comments: Transmitted upper airway wheeze. Abdominal:     Palpations: Abdomen is soft.     Tenderness: There is no abdominal tenderness.  Musculoskeletal:     Right ankle: Swelling present.     Left ankle: Swelling present.  Skin:    General: Skin is warm.     Findings: No rash.  Neurological:     Mental Status: He is lethargic.     Data Reviewed: Basic Metabolic Panel: Recent Labs  Lab  12/07/20 1719 12/08/20 0657 12/08/20 2307 12/09/20 0204 12/10/20 0559 12/11/20 0536  NA 139 141  --  137  --  137  K 4.1 3.8  --  4.6  --  3.8  CL 103 107  --  100  --  103  CO2 29 23  --  24  --  26  GLUCOSE 202* 184*  --  216*  --  158*  BUN 24* 24*  --  29*  --  35*  CREATININE 1.40* 1.35*  --  1.70*  --  1.32*  CALCIUM 8.5* 8.3*  --  8.5*  --  8.4*  MG  --  1.4* 1.9 2.0 1.9 2.0   Liver Function Tests: Recent Labs  Lab 12/07/20 1719  AST 20  ALT <5  ALKPHOS 52  BILITOT 0.6  PROT 6.7  ALBUMIN 3.7   CBC: Recent Labs  Lab 12/07/20 1719 12/08/20 0657 12/11/20 0536  WBC 7.1 7.0 7.0  NEUTROABS 4.4  --   --   HGB 9.9* 9.8* 8.7*  HCT 31.3* 30.8* 27.2*  MCV 99.1 97.2 95.8  PLT 199 183 175    BNP (last 3 results) Recent Labs    12/08/20 0657  BNP 699.5*     CBG: Recent Labs  Lab 12/10/20 1118 12/10/20 1601 12/10/20 2113 12/11/20 0728 12/11/20 1143  GLUCAP 197*  169* 251* 171* 80    Recent Results (from the past 240 hour(s))  Resp Panel by RT-PCR (Flu A&B, Covid) Nasopharyngeal Swab     Status: None   Collection Time: 12/07/20  8:40 PM   Specimen: Nasopharyngeal Swab; Nasopharyngeal(NP) swabs in vial transport medium  Result Value Ref Range Status   SARS Coronavirus 2 by RT PCR NEGATIVE NEGATIVE Final    Comment: (NOTE) SARS-CoV-2 target nucleic acids are NOT DETECTED.  The SARS-CoV-2 RNA is generally detectable in upper respiratory specimens during the acute phase of infection. The lowest concentration of SARS-CoV-2 viral copies this assay can detect is 138 copies/mL. A negative result does not preclude SARS-Cov-2 infection and should not be used as the sole basis for treatment or other patient management decisions. A negative result may occur with  improper specimen collection/handling, submission of specimen other than nasopharyngeal swab, presence of viral mutation(s) within the areas targeted by this assay, and inadequate number of  viral copies(<138 copies/mL). A negative result must be combined with clinical observations, patient history, and epidemiological information. The expected result is Negative.  Fact Sheet for Patients:  EntrepreneurPulse.com.au  Fact Sheet for Healthcare Providers:  IncredibleEmployment.be  This test is no t yet approved or cleared by the [place] FDA and  has been authorized for detection and/or diagnosis of SARS-CoV-2 by FDA under an Emergency Use Authorization (EUA). This EUA will remain  in effect (meaning this test can be used) for the duration of the COVID-19 declaration under Section 564(b)(1) of the Act, 21 U.S.C.section 360bbb-3(b)(1), unless the authorization is terminated  or revoked sooner.       Influenza A by PCR NEGATIVE NEGATIVE Final   Influenza B by PCR NEGATIVE NEGATIVE Final    Comment: (NOTE) The Xpert Xpress SARS-CoV-2/FLU/RSV plus assay is intended as an aid in the diagnosis of influenza from Nasopharyngeal swab specimens and should not be used as a sole basis for treatment. Nasal washings and aspirates are unacceptable for Xpert Xpress SARS-CoV-2/FLU/RSV testing.  Fact Sheet for Patients: EntrepreneurPulse.com.au  Fact Sheet for Healthcare Providers: IncredibleEmployment.be  This test is not yet approved or cleared by the [place] FDA and has been authorized for detection and/or diagnosis of SARS-CoV-2 by FDA under an Emergency Use Authorization (EUA). This EUA will remain in effect (meaning this test can be used) for the duration of the COVID-19 declaration under Section 564(b)(1) of the Act, 21 U.S.C. section 360bbb-3(b)(1), unless the authorization is terminated or revoked.  Performed at [facility], [address] [place], [place] [address]   Culture, blood (Routine X 2) w Reflex to ID Panel     Status: None (Preliminary result)   Collection Time:  12/08/20  7:04 AM   Specimen: BLOOD  Result Value Ref Range Status   Specimen Description BLOOD LEFT ANTECUBITAL  Final   Special Requests   Final    BOTTLES DRAWN AEROBIC AND ANAEROBIC Blood Culture adequate volume   Culture   Final    NO GROWTH 3 DAYS Performed at [facility], [address], [place], [place] [address]    Report Status PENDING  Incomplete  Culture, blood (Routine X 2) w Reflex to ID Panel     Status: None (Preliminary result)   Collection Time: 12/08/20  7:05 AM   Specimen: BLOOD  Result Value Ref Range Status   Specimen Description BLOOD BLOOD LEFT HAND  Final   Special Requests   Final    BOTTLES DRAWN AEROBIC AND ANAEROBIC  Blood Culture adequate volume   Culture   Final    NO GROWTH 3 DAYS Performed at [facility], [address] [place], [place] [address]    Report Status PENDING  Incomplete  MRSA Next Gen by PCR, Nasal     Status: None   Collection Time: 12/09/20 12:17 AM   Specimen: Nasal Mucosa; Nasal Swab  Result Value Ref Range Status   MRSA by PCR Next Gen NOT DETECTED NOT DETECTED Final    Comment: (NOTE) The GeneXpert MRSA Assay (FDA approved for NASAL specimens only), is one component of a comprehensive MRSA colonization surveillance program. It is not intended to diagnose MRSA infection nor to guide or monitor treatment for MRSA infections. Test performance is not FDA approved in patients less than 28 years old. Performed at [facility], [address] [place], [place] [address]       Scheduled Meds:  amiodarone  400 mg Oral BID   apixaban  5 mg Oral BID   atorvastatin  20 mg Oral Daily   carbidopa-levodopa  1 tablet Oral TID   Chlorhexidine Gluconate Cloth  6 each Topical Q0600   doxycycline  100 mg Oral Q12H   DULoxetine  30 mg Oral Daily   insulin aspart  0-9 Units Subcutaneous TID WC   insulin glargine  6 Units Subcutaneous QHS   ipratropium-albuterol  3 mL Nebulization TID   latanoprost  1 drop  Both Eyes QHS   magnesium oxide  400 mg Oral BID   metoprolol tartrate  25 mg Oral BID   polyethylene glycol  17 g Oral Daily   sodium chloride flush  3 mL Intravenous Q12H   traZODone  50 mg Oral QHS   Continuous Infusions:  sodium chloride Stopped (12/09/20 1234)   cefTRIAXone (ROCEPHIN)  IV 2 g (12/11/20 0758)    Assessment/Plan:  Lethargy.  Acute metabolic encephalopathy.  Was given Seroquel last night since he did not sleep well and was lethargic this morning.  Hopefully he will wake up more this afternoon. Upper airway transmitted wheeze.  Make nebulizer treatment standing dose and add budesonide nebulizers. Paroxysmal atrial fibrillation with rapid ventricular response.  Cardioverted yesterday and still in normal sinus rhythm amiodarone drip converted over to oral amiodarone Right lower lobe pneumonia on Rocephin and doxycycline Acute kidney injury on chronic kidney disease stage III.  Creatinine peaked at 1.7 and down to 1.32 today. Hypomagnesemia replaced during the hospital course Prior history of ischemic stroke with baseline right-sided weakness Type 2 diabetes mellitus with hyperlipidemia.  On atorvastatin.  Continue to hold Jardiance and metformin while here in the hospital.  We will hold low-dose Lantus. Anemia of chronic disease last hemoglobin 8.7 Lactic acidosis holding Glucophage Depression anxiety on Cymbalta Chronic respiratory failure on 4 L of oxygen.  Likely will be able to taper down since pulse ox is 100% Weakness.  Continue PT evaluations.  Family interested in rehab. Constipation.  On daily MiraLAX.  As needed Dulcolax pills.  Will add lactulose until bowel movement and then make as needed.     Code Status:     Code Status Orders  (From admission, onward)           Start     Ordered   12/07/20 2334  Full code  Continuous        12/07/20 2335           Code Status History     Date Active Date Inactive Code Status  Order ID Comments User  Context   11/11/2018 1817 11/17/2018 1950 Full Code [id number]  Gorden Harms, MD Inpatient   04/20/2018 2122 04/23/2018 1849 Full Code [id number]  Gladstone Lighter, MD Inpatient   05/18/2017 0303 05/21/2017 1928 Full Code [id number]  Lance Coon, MD Inpatient   03/17/2017 0419 03/17/2017 2109 Full Code [id number]  Saundra Shelling, MD ED   03/05/2015 0019 03/05/2015 1507 Full Code [id number]  Lance Coon, MD Inpatient      Family Communication: Spoke with daughter [name] on the phone. Disposition Plan: Status is: Inpatient  Dispo: The patient is from: Home              Anticipated d/c is to: Rehab              Patient currently lethargic this morning.  Discontinue Seroquel.   Difficult to place patient. No  Consultants: Cardiology  Antibiotics: Rocephin Doxycycline  Time spent: 27 minutes  [place]

## 2020-12-11 NOTE — Progress Notes (Addendum)
Progress Note  Patient Name: Todd [name] Date of Encounter: 12/11/2020  [facility] Cardiologist: Ida Rogue, MD   Subjective   Patient unable to verbalize but appears comfortable.  He is maintaining sinus rhythm following DCCV on 7/8. BP stable. Renal function improving. HGB 9.8-->8.7.   Inpatient Medications    Scheduled Meds:  apixaban  5 mg Oral BID   atorvastatin  20 mg Oral Daily   carbidopa-levodopa  1 tablet Oral TID   Chlorhexidine Gluconate Cloth  6 each Topical Q0600   doxycycline  100 mg Oral Q12H   DULoxetine  30 mg Oral Daily   insulin aspart  0-9 Units Subcutaneous TID WC   insulin glargine  6 Units Subcutaneous QHS   latanoprost  1 drop Both Eyes QHS   magnesium oxide  400 mg Oral BID   metoprolol tartrate  25 mg Oral BID   polyethylene glycol  17 g Oral Daily   sodium chloride flush  3 mL Intravenous Q12H   Continuous Infusions:  sodium chloride Stopped (12/09/20 1234)   amiodarone 30 mg/hr (12/11/20 0800)   cefTRIAXone (ROCEPHIN)  IV 2 g (12/11/20 0758)   PRN Meds: acetaminophen **OR** acetaminophen, bisacodyl, ipratropium-albuterol, ondansetron **OR** ondansetron (ZOFRAN) IV, QUEtiapine, risperiDONE   Vital Signs    Vitals:   12/11/20 0500 12/11/20 0600 12/11/20 0700 12/11/20 0800  BP: 123/75 112/89 126/72 (!) 147/105  Pulse: 74 71 (!) 133 77  Resp: 16 20 15 16   Temp:    98.8 F (37.1 C)  TempSrc:    Oral  SpO2: 100% 94% 94% 100%  Weight: 84.2 kg     Height:        Intake/Output Summary (Last 24 hours) at 12/11/2020 0914 Last data filed at 12/11/2020 0800 Gross per 24 hour  Intake 861.45 ml  Output 2450 ml  Net -1588.55 ml    Last 3 Weights 12/11/2020 12/10/2020 12/09/2020  Weight (lbs) 185 lb 10 oz 185 lb 10 oz 189 lb 13.1 oz  Weight (kg) 84.2 kg 84.2 kg 86.1 kg      Telemetry    SR with PVCs - Personally Reviewed  ECG    No new tracings - Personally Reviewed  Physical Exam   GEN: No acute distress.   Neck: No JVD Cardiac:  RRR, no murmurs, rubs, or gallops.  Respiratory: Mildly diminished breath sounds throughout with mild wheezes or crackles. GI: Soft, nontender, non-distended  MS: No edema; No deformity. Neuro:  Aphasic Psych: Normal affect   Labs    High Sensitivity Troponin:   Recent Labs  Lab 12/07/20 1719 12/07/20 1955 12/08/20 2307 12/09/20 0204  TROPONINIHS 95* 96* 127* 149*       Chemistry Recent Labs  Lab 12/07/20 1719 12/08/20 0657 12/09/20 0204 12/11/20 0536  NA 139 141 137 137  K 4.1 3.8 4.6 3.8  CL 103 107 100 103  CO2 29 23 24 26   GLUCOSE 202* 184* 216* 158*  BUN 24* 24* 29* 35*  CREATININE 1.40* 1.35* 1.70* 1.32*  CALCIUM 8.5* 8.3* 8.5* 8.4*  PROT 6.7  --   --   --   ALBUMIN 3.7  --   --   --   AST 20  --   --   --   ALT <5  --   --   --   ALKPHOS 52  --   --   --   BILITOT 0.6  --   --   --   GFRNONAA 48* 50*  38* 52*  ANIONGAP 7 11 13 8       Hematology Recent Labs  Lab 12/07/20 1719 12/08/20 0657 12/11/20 0536  WBC 7.1 7.0 7.0  RBC 3.16* 3.17* 2.84*  HGB 9.9* 9.8* 8.7*  HCT 31.3* 30.8* 27.2*  MCV 99.1 97.2 95.8  MCH 31.3 30.9 30.6  MCHC 31.6 31.8 32.0  RDW 12.8 12.8 12.9  PLT 199 183 175     BNP Recent Labs  Lab 12/08/20 0657  BNP 699.5*      DDimer No results for input(s): DDIMER in the last 168 hours.   Radiology    No results found.  Cardiac Studies   TTE (11/18/2019): 1. Left ventricular ejection fraction, by estimation, is 50 to 55%. The  left ventricle has low normal function. The left ventricle has no regional  wall motion abnormalities. There is moderate left ventricular hypertrophy.  Left ventricular diastolic  parameters are indeterminate.   2. Right ventricular systolic function is normal. The right ventricular  size is normal. There is normal pulmonary artery systolic pressure.   3. Left atrial size was moderately dilated.   4. Mild mitral valve regurgitation.   5. There is dilatation of the ascending aorta measuring  39 mm.   Patient Profile     87 y.o. 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  Assessment & Plan    Atrial fibrillation/flutter: He underwent successful cardioversion from atrial flutter to sinus rhythm on 7/8 and is maintaining sinus rhythm on rounds on 7/9. Transition IV amiodarone to oral amiodarone to complete 10 g load. Continue apixaban 5 mg twice daily. Continue metoprolol tartrate 25 mg twice daily.  Acute on chronic respiratory failure with hypoxia: Patient does not appear volume overloaded on exam.  Last echo a year ago showed low normal LVEF. Repeat echocardiogram is pending at this time. Wean oxygen, as tolerated. Continue ceftriaxone and doxycycline for possible right lower lobe pneumonia per internal medicine.  Elevated troponin and coronary artery disease: Suspect elevated troponin reflects demand ischemia.  EKG without acute ischemic changes but evidence of prior anterolateral and inferior infarct. Repeat echocardiogram as above.  Continue atorvastatin for secondary prevention. Defer ischemia unless there is new cardiomyopathy with wall motion abnormality, given the patient's comorbidities.  Acute kidney injury: Potentially due to intravascular volume depletion.  Patient does not appear volume overloaded today. Improving. Continue to hold diuretics. Avoid nephrotoxic drugs.   For questions or updates, please contact [place] Please consult [email or web address] for contact info under [facility].     Signed, Christell Faith, PA-C  12/11/2020, 9:14 AM    Rhythm is stable post [facility] with sinus first degree PAC.  Nurse indicates they can crush  amiodarone and patient can take orally Continue eliquis and beta blocker Rhythm improved post [facility]  [name] Rouge MD [facility]

## 2020-12-12 ENCOUNTER — Inpatient Hospital Stay: Payer: HMO

## 2020-12-12 LAB — CBC
HCT: 30.6 % — ABNORMAL LOW (ref 39.0–52.0)
Hemoglobin: 9.6 g/dL — ABNORMAL LOW (ref 13.0–17.0)
MCH: 30.7 pg (ref 26.0–34.0)
MCHC: 31.4 g/dL (ref 30.0–36.0)
MCV: 97.8 fL (ref 80.0–100.0)
Platelets: 191 10*3/uL (ref 150–400)
RBC: 3.13 MIL/uL — ABNORMAL LOW (ref 4.22–5.81)
RDW: 12.8 % (ref 11.5–15.5)
WBC: 6.3 10*3/uL (ref 4.0–10.5)
nRBC: 0 % (ref 0.0–0.2)

## 2020-12-12 LAB — GLUCOSE, CAPILLARY
Glucose-Capillary: 156 mg/dL — ABNORMAL HIGH (ref 70–99)
Glucose-Capillary: 132 mg/dL — ABNORMAL HIGH (ref 70–99)
Glucose-Capillary: 193 mg/dL — ABNORMAL HIGH (ref 70–99)
Glucose-Capillary: 218 mg/dL — ABNORMAL HIGH (ref 70–99)

## 2020-12-12 LAB — BLOOD GAS, ARTERIAL
Acid-Base Excess: 3.9 mmol/L — ABNORMAL HIGH (ref 0.0–2.0)
Bicarbonate: 28.5 mmol/L — ABNORMAL HIGH (ref 20.0–28.0)
FIO2: 0.36
O2 Saturation: 98.1 %
Patient temperature: 37
pCO2 arterial: 42 mmHg (ref 32.0–48.0)
pH, Arterial: 7.44 (ref 7.350–7.450)
pO2, Arterial: 103 mmHg (ref 83.0–108.0)

## 2020-12-12 LAB — MAGNESIUM: Magnesium: 2 mg/dL (ref 1.7–2.4)

## 2020-12-12 MED ORDER — TRAZODONE HCL 50 MG PO TABS: 25.0000 mg | ORAL_TABLET | Freq: Every evening | ORAL | Status: DC | PRN

## 2020-12-12 MED ORDER — ISOSORB DINITRATE-HYDRALAZINE 20-37.5 MG PO TABS
0.5000 | ORAL_TABLET | Freq: Three times a day (TID) | ORAL | Status: DC
  Administered 2020-12-12 – 2020-12-14 (×9): 0.5 via ORAL
  Filled 2020-12-12 (×10): qty 0.5

## 2020-12-12 MED ORDER — CARBIDOPA-LEVODOPA 25-100 MG PO TABS
1.0000 | ORAL_TABLET | Freq: Three times a day (TID) | ORAL | Status: DC
  Filled 2020-12-12: qty 1

## 2020-12-12 MED ORDER — CARBIDOPA-LEVODOPA 25-250 MG PO TABS
1.0000 | ORAL_TABLET | Freq: Three times a day (TID) | ORAL | Status: DC
  Filled 2020-12-12 (×2): qty 1

## 2020-12-12 MED ORDER — CARBIDOPA-LEVODOPA 25-100 MG PO TABS
1.0000 | ORAL_TABLET | Freq: Three times a day (TID) | ORAL | Status: DC
  Administered 2020-12-12 – 2020-12-16 (×12): 1 via ORAL
  Filled 2020-12-12 (×14): qty 1

## 2020-12-12 NOTE — NC FL2 (Signed)
[place] LEVEL OF CARE SCREENING TOOL     IDENTIFICATION  Patient Name: Todd [name] Birthdate: 1930-09-15 Sex: male Admission Date (Current Location): 12/07/2020  [facility] and [place] Number:  Engineering geologist and Address:  [facility], [address] [place], [place] [address]      Provider Number: [id number]  Attending Physician Name and Address:  Loletha Grayer, MD  Relative Name and Phone Number:       Current Level of Care: Hospital Recommended Level of Care: [place] Prior Approval Number:    Date Approved/Denied:   PASRR Number: [id number]  Discharge Plan: SNF    Current Diagnoses: Patient Active Problem List   Diagnosis Date Noted   Wheeze    Pneumonia of right lower lobe due to infectious organism    Acute kidney injury superimposed on CKD ([place])    Acute metabolic encephalopathy    Atrial fibrillation and flutter ([place]) 12/08/2020   Hypomagnesemia    Ischemic stroke ([place])    Paroxysmal atrial fibrillation with rapid ventricular response ([place]) 12/07/2020   Lower urinary tract symptoms (LUTS) 05/19/2020   Atrial fibrillation with rapid ventricular response ([place]) 11/11/2018   Frequent PVCs    Anemia due to stage 3 chronic kidney disease ([place]) 09/16/2018   Normocytic anemia 05/17/2018   Hemiplegia ([place]) 04/25/2018   B12 deficiency 04/25/2018   Dysphagia as late effect of cerebrovascular accident (CVA) 04/25/2018   Depression, major, single episode, severe ([place]) 04/25/2018   CHF (congestive heart failure) ([place]) 01/09/2018   Advanced care planning/counseling discussion 12/20/2017   Hypoxia 11/02/2017   Mixed hyperlipidemia 09/21/2017   Aphasia 05/18/2017   CAD (coronary artery disease) 05/18/2017   History of stroke with residual deficit 05/18/2017   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 11/09/2015   Knee joint replaced by other means 07/04/2015   Urinary incontinence 04/27/2015   Anxiety,  generalized 04/07/2015   History of BCG vaccination 04/07/2015   GERD (gastroesophageal reflux disease) 03/04/2015   Lethargy 03/01/2015   Anxiety and depression 02/25/2015   Degeneration of intervertebral disc of lumbar region 02/23/2015   Neuritis or radiculitis due to rupture of lumbar intervertebral disc 02/23/2015   Lumbar stenosis with neurogenic claudication 02/23/2015   Hypertension 12/02/2014   Type 2 diabetes mellitus with hyperlipidemia ([place]) 12/02/2014   Benign essential tremor 03/13/2014   Imbalance 03/13/2014   Prostate cancer ([place]) 10/07/2012   ED (erectile dysfunction) of organic origin 02/09/2012    Orientation RESPIRATION BLADDER Height & Weight     Self  O2 (Nasal Canula 4 L) Incontinent, External catheter Weight: 184 lb 4.9 oz (83.6 kg) Height:  5\' 8"  (172.7 cm)  BEHAVIORAL SYMPTOMS/MOOD NEUROLOGICAL BOWEL NUTRITION STATUS   (None)  (History of stroke) Incontinent Diet (Soft. May have gravy with meals.)  AMBULATORY STATUS COMMUNICATION OF NEEDS Skin   Extensive Assist Verbally Skin abrasions                       Personal Care Assistance Level of Assistance  Bathing, Feeding, Dressing Bathing Assistance: Maximum assistance Feeding assistance: Maximum assistance Dressing Assistance: Maximum assistance     Functional Limitations Info  Sight, Hearing, Speech Sight Info: Adequate Hearing Info: Adequate Speech Info: Impaired (Expressive aphasia)    SPECIAL CARE FACTORS FREQUENCY  PT (By licensed PT), OT (By licensed OT)     PT Frequency: 5 x week OT Frequency: 5 x week  Contractures Contractures Info: Not present    Additional Factors Info  Code Status, Allergies, Psychotropic Code Status Info: Full code Allergies Info: Amlodipine, Fluoxetine, Meloxicam Psychotropic Info: Anxiety, depression         Current Medications (12/12/2020):  This is the current hospital active medication list Current Facility-Administered Medications   Medication Dose Route Frequency Provider Last Rate Last Admin   0.9 %  sodium chloride infusion  250 mL Intravenous Continuous End, Harrell Gave, MD   Stopped at 12/09/20 1234   acetaminophen (TYLENOL) tablet 650 mg  650 mg Oral Q6H PRN End, Harrell Gave, MD       Or   acetaminophen (TYLENOL) suppository 650 mg  650 mg Rectal Q6H PRN End, Harrell Gave, MD       amiodarone (PACERONE) tablet 400 mg  400 mg Oral BID Dunn, Ryan M, PA-C   400 mg at 12/11/20 2117   apixaban (ELIQUIS) tablet 5 mg  5 mg Oral BID End, Harrell Gave, MD   5 mg at 12/11/20 2117   bisacodyl (DULCOLAX) EC tablet 5 mg  5 mg Oral Daily PRN End, Harrell Gave, MD   5 mg at 12/11/20 1005   budesonide (PULMICORT) nebulizer solution 0.5 mg  0.5 mg Nebulization BID Loletha Grayer, MD   0.5 mg at 12/12/20 0757   carbidopa-levodopa (SINEMET IR) 25-100 MG per tablet immediate release 1 tablet  1 tablet Oral TID AC Shanlever, Pierce Crane, RPH       cefTRIAXone (ROCEPHIN) 2 g in sodium chloride 0.9 % 100 mL IVPB  2 g Intravenous Q24H End, Christopher, MD 200 mL/hr at 12/12/20 0905 2 g at 12/12/20 0905   Chlorhexidine Gluconate Cloth 2 % PADS 6 each  6 each Topical Q0600 End, Christopher, MD   6 each at 12/11/20 1007   doxycycline (VIBRA-TABS) tablet 100 mg  100 mg Oral Q12H End, Christopher, MD   100 mg at 12/11/20 2117   DULoxetine (CYMBALTA) DR capsule 30 mg  30 mg Oral Daily End, Christopher, MD   30 mg at 12/11/20 1004   insulin aspart (novoLOG) injection 0-9 Units  0-9 Units Subcutaneous TID WC End, Christopher, MD   2 Units at 12/12/20 0905   ipratropium-albuterol (DUONEB) 0.5-2.5 (3) MG/3ML nebulizer solution 3 mL  3 mL Nebulization TID Loletha Grayer, MD   3 mL at 12/12/20 0757   isosorbide-hydrALAZINE (BIDIL) 20-37.5 MG per tablet 0.5 tablet  0.5 tablet Oral TID Dunn, Ryan M, PA-C       lactulose (CHRONULAC) 10 GM/15ML solution 30 g  30 g Oral BID PRN Wieting, Richard, MD       latanoprost (XALATAN) 0.005 % ophthalmic solution 1 drop   1 drop Both Eyes QHS End, Christopher, MD   1 drop at 12/11/20 2118   magnesium oxide (MAG-OX) tablet 400 mg  400 mg Oral BID End, Christopher, MD   400 mg at 12/11/20 1005   metoprolol tartrate (LOPRESSOR) tablet 25 mg  25 mg Oral BID End, Christopher, MD   25 mg at 12/11/20 2117   ondansetron (ZOFRAN) tablet 4 mg  4 mg Oral Q6H PRN End, Harrell Gave, MD       Or   ondansetron (ZOFRAN) injection 4 mg  4 mg Intravenous Q6H PRN End, Harrell Gave, MD       polyethylene glycol (MIRALAX / GLYCOLAX) packet 17 g  17 g Oral Daily End, Christopher, MD   17 g at 12/11/20 1007   sodium chloride flush (NS) 0.9 % injection 3 mL  3 mL  Intravenous Q12H End, Christopher, MD   3 mL at 12/12/20 1000     Discharge Medications: Please see discharge summary for a list of discharge medications.  Relevant Imaging Results:  Relevant Lab Results:   Additional Information SS#: [id number]. COVID vaccines: Moderna 08/06/19, 09/03/19  Candie Chroman, LCSW

## 2020-12-12 NOTE — Progress Notes (Signed)
Patient ID: Todd [name], male   DOB: 05/28/1931, 87 y.o.   MRN: [id number] [facility] PROGRESS NOTE  Todd [name] GBT:[id number] DOB: Oct 17, 1930 DOA: 12/07/2020 PCP: Park Liter P, DO  HPI/Subjective: Patient less responsive today.  I will come up and tried to give him a bite of food and he would not take it.  Initially admitted with rapid atrial fibrillation.  Objective: Vitals:   12/12/20 0757 12/12/20 1158  BP:  (!) 118/91  Pulse:  62  Resp:  17  Temp:  98.2 F (36.8 C)  SpO2: 99% 100%    Intake/Output Summary (Last 24 hours) at 12/12/2020 1408 Last data filed at 12/12/2020 7106 Gross per 24 hour  Intake 129.58 ml  Output 300 ml  Net -170.42 ml   Filed Weights   12/10/20 0453 12/11/20 0500 12/12/20 0442  Weight: 84.2 kg 84.2 kg 83.6 kg    ROS: Review of Systems  Unable to perform ROS: Acuity of condition  Exam: Physical Exam HENT:     Head: Normocephalic.     Mouth/Throat:     Pharynx: No oropharyngeal exudate.  Eyes:     General: Lids are normal.     Conjunctiva/sclera: Conjunctivae normal.  Cardiovascular:     Rate and Rhythm: Normal rate and regular rhythm.     Heart sounds: Normal heart sounds, S1 normal and S2 normal.  Pulmonary:     Breath sounds: Examination of the right-lower field reveals decreased breath sounds. Examination of the left-lower field reveals decreased breath sounds. Decreased breath sounds present. No wheezing, rhonchi or rales.  Abdominal:     Palpations: Abdomen is soft.     Tenderness: There is no abdominal tenderness.  Musculoskeletal:     Right knee: Swelling present.     Left knee: Swelling present.  Skin:    General: Skin is warm.     Findings: No rash.  Neurological:     Mental Status: He is lethargic.     Data Reviewed: Basic Metabolic Panel: Recent Labs  Lab 12/07/20 1719 12/08/20 0657 12/08/20 0657 12/08/20 2307 12/09/20 0204 12/10/20 0559 12/11/20 0536 12/12/20 0629  NA 139 141  --   --  137  --  137   --   K 4.1 3.8  --   --  4.6  --  3.8  --   CL 103 107  --   --  100  --  103  --   CO2 29 23  --   --  24  --  26  --   GLUCOSE 202* 184*  --   --  216*  --  158*  --   BUN 24* 24*  --   --  29*  --  35*  --   CREATININE 1.40* 1.35*  --   --  1.70*  --  1.32*  --   CALCIUM 8.5* 8.3*  --   --  8.5*  --  8.4*  --   MG  --  1.4*   < > 1.9 2.0 1.9 2.0 2.0   < > = values in this interval not displayed.   Liver Function Tests:  CBC: Recent Labs  Lab 12/07/20 1719 12/08/20 0657 12/11/20 0536 12/12/20 0847  WBC 7.1 7.0 7.0 6.3  NEUTROABS 4.4  --   --   --   HGB 9.9* 9.8* 8.7* 9.6*  HCT 31.3* 30.8* 27.2* 30.6*  MCV 99.1 97.2 95.8 97.8  PLT 199 183 175 191    BNP (  last 3 results) Recent Labs    12/08/20 0657  BNP 699.5*    CBG: Recent Labs  Lab 12/11/20 1734 12/11/20 2035 12/11/20 2122 12/12/20 0727 12/12/20 1151  GLUCAP 149* 206* 223* 156* 132*    Recent Results (from the past 240 hour(s))  Resp Panel by RT-PCR (Flu A&B, Covid) Nasopharyngeal Swab     Status: None   Collection Time: 12/07/20  8:40 PM   Specimen: Nasopharyngeal Swab; Nasopharyngeal(NP) swabs in vial transport medium  Result Value Ref Range Status   SARS Coronavirus 2 by RT PCR NEGATIVE NEGATIVE Final    Comment: (NOTE) SARS-CoV-2 target nucleic acids are NOT DETECTED.  The SARS-CoV-2 RNA is generally detectable in upper respiratory specimens during the acute phase of infection. The lowest concentration of SARS-CoV-2 viral copies this assay can detect is 138 copies/mL. A negative result does not preclude SARS-Cov-2 infection and should not be used as the sole basis for treatment or other patient management decisions. A negative result may occur with  improper specimen collection/handling, submission of specimen other than nasopharyngeal swab, presence of viral mutation(s) within the areas targeted by this assay, and inadequate number of viral copies(<138 copies/mL). A negative result must be  combined with clinical observations, patient history, and epidemiological information. The expected result is Negative.  Fact Sheet for Patients:  EntrepreneurPulse.com.au  Fact Sheet for Healthcare Providers:  IncredibleEmployment.be  This test is no t yet approved or cleared by the [place] FDA and  has been authorized for detection and/or diagnosis of SARS-CoV-2 by FDA under an Emergency Use Authorization (EUA). This EUA will remain  in effect (meaning this test can be used) for the duration of the COVID-19 declaration under Section 564(b)(1) of the Act, 21 U.S.C.section 360bbb-3(b)(1), unless the authorization is terminated  or revoked sooner.       Influenza A by PCR NEGATIVE NEGATIVE Final   Influenza B by PCR NEGATIVE NEGATIVE Final    Comment: (NOTE) The Xpert Xpress SARS-CoV-2/FLU/RSV plus assay is intended as an aid in the diagnosis of influenza from Nasopharyngeal swab specimens and should not be used as a sole basis for treatment. Nasal washings and aspirates are unacceptable for Xpert Xpress SARS-CoV-2/FLU/RSV testing.  Fact Sheet for Patients: EntrepreneurPulse.com.au  Fact Sheet for Healthcare Providers: IncredibleEmployment.be  This test is not yet approved or cleared by the [place] FDA and has been authorized for detection and/or diagnosis of SARS-CoV-2 by FDA under an Emergency Use Authorization (EUA). This EUA will remain in effect (meaning this test can be used) for the duration of the COVID-19 declaration under Section 564(b)(1) of the Act, 21 U.S.C. section 360bbb-3(b)(1), unless the authorization is terminated or revoked.  Performed at [facility], [address] [place], [place] [address]   Culture, blood (Routine X 2) w Reflex to ID Panel     Status: None (Preliminary result)   Collection Time: 12/08/20  7:04 AM   Specimen: BLOOD  Result Value Ref  Range Status   Specimen Description BLOOD LEFT ANTECUBITAL  Final   Special Requests   Final    BOTTLES DRAWN AEROBIC AND ANAEROBIC Blood Culture adequate volume   Culture   Final    NO GROWTH 4 DAYS Performed at [facility], [address], [place], [place] [address]    Report Status PENDING  Incomplete  Culture, blood (Routine X 2) w Reflex to ID Panel     Status: None (Preliminary result)   Collection Time: 12/08/20  7:05 AM  Specimen: BLOOD  Result Value Ref Range Status   Specimen Description BLOOD BLOOD LEFT HAND  Final   Special Requests   Final    BOTTLES DRAWN AEROBIC AND ANAEROBIC Blood Culture adequate volume   Culture   Final    NO GROWTH 4 DAYS Performed at [facility], [address] [place], [place] [address]    Report Status PENDING  Incomplete  MRSA Next Gen by PCR, Nasal     Status: None   Collection Time: 12/09/20 12:17 AM   Specimen: Nasal Mucosa; Nasal Swab  Result Value Ref Range Status   MRSA by PCR Next Gen NOT DETECTED NOT DETECTED Final    Comment: (NOTE) The GeneXpert MRSA Assay (FDA approved for NASAL specimens only), is one component of a comprehensive MRSA colonization surveillance program. It is not intended to diagnose MRSA infection nor to guide or monitor treatment for MRSA infections. Test performance is not FDA approved in patients less than 3 years old. Performed at [facility], [address] [place],  [address]      Studies: CT HEAD WO CONTRAST  Result Date: 12/12/2020 CLINICAL DATA:  Mental status change with unknown cause EXAM: CT HEAD WITHOUT CONTRAST TECHNIQUE: Contiguous axial images were obtained from the base of the skull through the vertex without intravenous contrast. COMPARISON:  04/20/2018 FINDINGS: Brain: No evidence of acute infarction, hemorrhage, hydrocephalus, extra-axial collection or mass lesion/mass effect. Atrophy and confluent chronic small vessel ischemia in the hemispheric  white matter. Remote left pontine infarct. Vascular: No hyperdense vessel or unexpected calcification. Skull: Normal. Negative for fracture or focal lesion. Sinuses/Orbits: No acute finding. Other: Notable streak artifact across the ventral brain. IMPRESSION: 1. No acute finding or change from 2019. 2. Chronic small vessel ischemia including remote left pontine infarct. Electronically Signed   By: Monte Fantasia M.D.   On: 12/12/2020 11:25   ECHOCARDIOGRAM COMPLETE  Result Date: 12/11/2020    ECHOCARDIOGRAM REPORT   Patient Name:   Chia Whisman Date of Exam: 12/11/2020 Medical Rec #:  [id number]   Height:       68.0 in Accession #:    [id number]  Weight:       185.6 lb Date of Birth:  [birth year]   BSA:          1.980 m Patient Age:    51 years    BP:           130/77 mmHg Patient Gender: M           HR:           62 bpm. Exam Location:  [facility] Procedure: 2D Echo Indications:    chest pain  History:        Patient has prior history of Echocardiogram examinations. Prior                 echo 11/18/19.  Sonographer:    Kathlen Brunswick Referring Phys: [place]  1. Left ventricular ejection fraction, by estimation, is 45 to 50%. The left ventricle has mildly decreased function. The left ventricle demonstrates global hypokinesis. The left ventricular internal cavity size was mildly dilated. There is moderate left ventricular hypertrophy. Left ventricular diastolic parameters are consistent with Grade II diastolic dysfunction (pseudonormalization). Elevated left ventricular end-diastolic pressure.  2. Right ventricular systolic function is mildly reduced. The right ventricular size is mildly enlarged.  3. Left atrial size was mildly dilated.  4. Right atrial size was mildly dilated.  5.  The mitral valve is abnormal. Mild mitral valve regurgitation. No evidence of mitral stenosis. Moderate mitral annular calcification.  6. The aortic valve is tricuspid. There is moderate calcification of the aortic  valve. Aortic valve regurgitation is not visualized. Mild to moderate aortic valve stenosis.  7. Aortic dilatation noted. There is mild dilatation of the ascending aorta, measuring 38 mm.  8. The inferior vena cava is normal in size with <50% respiratory variability, suggesting right atrial pressure of 8 mmHg. FINDINGS  Left Ventricle: Left ventricular ejection fraction, by estimation, is 45 to 50%. The left ventricle has mildly decreased function. The left ventricle demonstrates global hypokinesis. The left ventricular internal cavity size was mildly dilated. There is  moderate left ventricular hypertrophy. Left ventricular diastolic parameters are consistent with Grade II diastolic dysfunction (pseudonormalization). Elevated left ventricular end-diastolic pressure. Right Ventricle: The right ventricular size is mildly enlarged. No increase in right ventricular wall thickness. Right ventricular systolic function is mildly reduced. Left Atrium: Left atrial size was mildly dilated. Right Atrium: Right atrial size was mildly dilated. Pericardium: There is no evidence of pericardial effusion. Mitral Valve: The mitral valve is abnormal. There is mild thickening of the mitral valve leaflet(s). There is mild calcification of the mitral valve leaflet(s). Moderate mitral annular calcification. Mild mitral valve regurgitation. No evidence of mitral  valve stenosis. Tricuspid Valve: The tricuspid valve is normal in structure. Tricuspid valve regurgitation is mild . No evidence of tricuspid stenosis. Aortic Valve: The aortic valve is tricuspid. There is moderate calcification of the aortic valve. Aortic valve regurgitation is not visualized. Mild to moderate aortic stenosis is present. Aortic valve peak gradient measures 4.5 mmHg. Pulmonic Valve: The pulmonic valve was normal in structure. Pulmonic valve regurgitation is mild. No evidence of pulmonic stenosis. Aorta: The aortic root is normal in size and structure and aortic  dilatation noted. There is mild dilatation of the ascending aorta, measuring 38 mm. Venous: The inferior vena cava is normal in size with less than 50% respiratory variability, suggesting right atrial pressure of 8 mmHg. IAS/Shunts: No atrial level shunt detected by color flow Doppler.  LEFT VENTRICLE PLAX 2D LVIDd:         4.25 cm     Diastology LVIDs:         3.31 cm     LV e' medial:    3.37 cm/s LV PW:         1.59 cm     LV E/e' medial:  25.8 LV IVS:        1.61 cm     LV e' lateral:   5.00 cm/s LVOT diam:     2.00 cm     LV E/e' lateral: 17.4 LV SV:         32 LV SV Index:   16 LVOT Area:     3.14 cm  LV Volumes (MOD) LV vol d, MOD A2C: 64.7 ml LV vol d, MOD A4C: 49.8 ml LV vol s, MOD A2C: 30.4 ml LV vol s, MOD A4C: 26.7 ml LV SV MOD A2C:     34.3 ml LV SV MOD A4C:     49.8 ml LV SV MOD BP:      30.5 ml RIGHT VENTRICLE RV Basal diam:  3.49 cm TAPSE (M-mode): 1.6 cm LEFT ATRIUM             Index       RIGHT ATRIUM           Index LA  diam:        4.20 cm 2.12 cm/m  RA Area:     17.10 cm LA Vol (A2C):   95.6 ml 48.28 ml/m RA Volume:   45.20 ml  22.83 ml/m LA Vol (A4C):   50.0 ml 25.25 ml/m LA Biplane Vol: 69.8 ml 35.25 ml/m  AORTIC VALVE                PULMONIC VALVE AV Area (Vmax): 1.66 cm    PV Vmax:       0.65 m/s AV Vmax:        106.00 cm/s PV Peak grad:  1.7 mmHg AV Peak Grad:   4.5 mmHg LVOT Vmax:      56.10 cm/s LVOT Vmean:     35.700 cm/s LVOT VTI:       0.102 m  AORTA Ao Root diam: 3.40 cm Ao Asc diam:  3.80 cm MITRAL VALVE               TRICUSPID VALVE MV Area (PHT): 3.02 cm    TV Peak grad:   45.2 mmHg MV Decel Time: 251 msec    TV Vmax:        3.36 m/s MV E velocity: 87.00 cm/s MV A velocity: 35.10 cm/s  SHUNTS MV E/A ratio:  2.48        Systemic VTI:  0.10 m                            Systemic Diam: 2.00 cm [name] Rouge MD Electronically signed by [name] Rouge MD Signature Date/Time: 12/11/2020/3:12:55 PM    Final     Scheduled Meds:  amiodarone  400 mg Oral BID   apixaban  5 mg Oral BID    budesonide (PULMICORT) nebulizer solution  0.5 mg Nebulization BID   carbidopa-levodopa  1 tablet Oral TID AC   Chlorhexidine Gluconate Cloth  6 each Topical Q0600   doxycycline  100 mg Oral Q12H   DULoxetine  30 mg Oral Daily   insulin aspart  0-9 Units Subcutaneous TID WC   ipratropium-albuterol  3 mL Nebulization TID   isosorbide-hydrALAZINE  0.5 tablet Oral TID   latanoprost  1 drop Both Eyes QHS   magnesium oxide  400 mg Oral BID   metoprolol tartrate  25 mg Oral BID   polyethylene glycol  17 g Oral Daily   sodium chloride flush  3 mL Intravenous Q12H   Continuous Infusions:  sodium chloride Stopped (12/09/20 1234)   cefTRIAXone (ROCEPHIN)  IV 2 g (12/12/20 0905)    Assessment/Plan:  Lethargy with acute metabolic encephalopathy.  CT scan of the head done today is negative.  Discontinue any medications that can cause altered mental status.  Hopefully will wake up more this afternoon.  We will get ABG to make sure he is not retaining carbon dioxide. Upper airway wheeze on nebulizer treatments.  We will repeat a chest x-ray. Paroxysmal atrial fibrillation cardioverted on 12/10/2020 to normal sinus rhythm.  Continue oral amiodarone and Eliquis Right lower lobe pneumonia on Rocephin and doxycycline Acute kidney injury on chronic kidney disease stage III.  Creatinine peaked at 1.7 and down to 1.32 Prior history of ischemic stroke with baseline right-sided weakness Hypomagnesemia Type 2 diabetes mellitus with hyperlipidemia on atorvastatin.  Continue to hold Jardiance and metformin here in the hospital.  Sliding scale insulin. Anemia of chronic disease.  Last hemoglobin 9.6. Chronic respiratory failure on 4 L  of oxygen Weakness.  Continue physical therapy evaluation. Constipation on MiraLAX and Dulcolax pills Depression anxiety on Cymbalta Lactic acidosis.  Holding Glucophage.        Code Status:     Code Status Orders  (From admission, onward)           Start      Ordered   12/07/20 2334  Full code  Continuous        12/07/20 2335           Code Status History     Date Active Date Inactive Code Status Order ID Comments User Context   11/11/2018 1817 11/17/2018 1950 Full Code [id number]  Gorden Harms, MD Inpatient   04/20/2018 2122 04/23/2018 1849 Full Code [id number]  Gladstone Lighter, MD Inpatient   05/18/2017 0303 05/21/2017 1928 Full Code [id number]  Lance Coon, MD Inpatient   03/17/2017 0419 03/17/2017 2109 Full Code [id number]  Saundra Shelling, MD ED   03/05/2015 0019 03/05/2015 1507 Full Code [id number]  Lance Coon, MD Inpatient      Family Communication: Spoke with 1 sister on the phone and the other sister at the bedside at the same time Disposition Plan: Status is: Inpatient  Dispo: The patient is from: Home              Anticipated d/c is to: Rehab              Patient currently still with lethargy.   Difficult to place patient.  No.  Consultants: Cardiology  Procedures: Cardioversion  Antibiotics: Rocephin and doxycycline  Time spent: 27 minutes  [place]

## 2020-12-12 NOTE — Progress Notes (Signed)
Physical Therapy Treatment Patient Details Name: Todd [name] MRN: [id number] DOB: 09/21/1930 Today's Date: 12/12/2020    History of Present Illness Todd [name] is an 38yoM who comes [facility] on 09/07/20 c fatigue, weakness, anorrexia. 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 PMH: PAF on eliquis and digoxin, CAD, CVA c Rt hemiplegia, CKD3a, DM2, HTN, HLD, chronic anemia. Pt adm for Afib with RVR. T/f to SDU on 12/08/20 for closer monitoring d/t increasd HR and increased O2 needs. Pt now s/p cardioversion and is in NSR.    PT Comments    Patient is lethargic during this session and requires maximal cues to maintain alertness for participation with mobility efforts. Patient with increased alertness while seated at edge of bed. Significant assistance is required for bed mobility and transfer efforts. Unable to progress to standing this session due to lethargy and poor overall participation. Discharge plan updated to recommend SNF placement for ongoing PT efforts.  Recommend to continue PT to maximize independence and decrease caregiver burden.    Follow Up Recommendations  SNF     Equipment Recommendations   (to be determined at next level of care)    Recommendations for Other Services       Precautions / Restrictions Precautions Precautions: Fall Restrictions Weight Bearing Restrictions: No    Mobility  Bed Mobility Overal bed mobility: Needs Assistance Bed Mobility: Supine to Sit;Sit to Supine     Supine to sit: Max assist Sit to supine: Max assist   General bed mobility comments: assistance for trunk and BLE support. verbla cues for task initiation and to maintain alertness for participation    Transfers Overall transfer level: Needs assistance   Transfers: Lateral/Scoot Transfers          Lateral/Scoot Transfers: Max assist General transfer comment: Max A for incremental scoot transfer along edge of bed in sitting position. patient is more alert in sitting  position, however continues to be lethargic overall. unable to progress to standing due to poor participation  Ambulation/Gait                 Stairs             Wheelchair Mobility    Modified Rankin (Stroke Patients Only)       Balance Overall balance assessment: Needs assistance Sitting-balance support: Feet supported Sitting balance-Leahy Scale: Fair Sitting balance - Comments: posterior lean at times. close stand by assistance for safety                                    Cognition Arousal/Alertness: Lethargic Behavior During Therapy: [facility] for tasks assessed/performed Overall Cognitive Status: No family/caregiver present to determine baseline cognitive functioning                                 General Comments: patient has difficulty maintaining alertness at times and needs constant stimulation to remain awake      Exercises      General Comments        Pertinent Vitals/Pain Pain Assessment: No/denies pain    Home Living                      Prior Function            PT Goals (current goals can now be found in the care plan  section) Acute Rehab PT Goals Patient Stated Goal: none stated PT Goal Formulation: Patient unable to participate in goal setting Time For Goal Achievement: 12/22/20 Potential to Achieve Goals: Fair Progress towards PT goals:  (slow progress towards goals)    Frequency    Min 2X/week      PT Plan Discharge plan needs to be updated    Co-evaluation              AM-PAC PT "6 Clicks" Mobility   Outcome Measure  Help needed turning from your back to your side while in a flat bed without using bedrails?: A Lot Help needed moving from lying on your back to sitting on the side of a flat bed without using bedrails?: A Lot Help needed moving to and from a bed to a chair (including a wheelchair)?: Total Help needed standing up from a chair using your arms (e.g., wheelchair  or bedside chair)?: Total Help needed to walk in hospital room?: Total Help needed climbing 3-5 steps with a railing? : Total 6 Click Score: 8    End of Session Equipment Utilized During Treatment: Oxygen Activity Tolerance: Patient limited by lethargy Patient left: in bed;with call bell/phone within reach;with bed alarm set (head of bed elevated, set up with breakfast tray. no family in the room) Nurse Communication: Mobility status PT Visit Diagnosis: Difficulty in walking, not elsewhere classified (R26.2);Other abnormalities of gait and mobility (R26.89);Muscle weakness (generalized) (M62.81)     Time: 7824-2353 PT Time Calculation (min) (ACUTE ONLY): 23 min  Charges:  $Therapeutic Activity: 23-37 mins                     Minna Merritts, PT, MPT    Percell Locus 12/12/2020, 10:33 AM

## 2020-12-12 NOTE — Progress Notes (Addendum)
Progress Note  Patient Name: Todd [name] Date of Encounter: 12/12/2020  [facility] Cardiologist: Ida Rogue, MD   Subjective   Transferred out of the ICU to the floor yesterday. Patient unable to verbalize but appears comfortable.  He is maintaining sinus rhythm following DCCV on 7/8. BP stable. Renal function improving. HGB 9.8-->8.7 yesterday.   Inpatient Medications    Scheduled Meds:  amiodarone  400 mg Oral BID   apixaban  5 mg Oral BID   atorvastatin  20 mg Oral Daily   budesonide (PULMICORT) nebulizer solution  0.5 mg Nebulization BID   carbidopa-levodopa  1 tablet Oral TID   Chlorhexidine Gluconate Cloth  6 each Topical Q0600   doxycycline  100 mg Oral Q12H   DULoxetine  30 mg Oral Daily   insulin aspart  0-9 Units Subcutaneous TID WC   ipratropium-albuterol  3 mL Nebulization TID   isosorbide-hydrALAZINE  0.5 tablet Oral TID   latanoprost  1 drop Both Eyes QHS   magnesium oxide  400 mg Oral BID   metoprolol tartrate  25 mg Oral BID   polyethylene glycol  17 g Oral Daily   sodium chloride flush  3 mL Intravenous Q12H   traZODone  50 mg Oral QHS   Continuous Infusions:  sodium chloride Stopped (12/09/20 1234)   cefTRIAXone (ROCEPHIN)  IV Stopped (12/11/20 0829)   PRN Meds: acetaminophen **OR** acetaminophen, bisacodyl, lactulose, ondansetron **OR** ondansetron (ZOFRAN) IV, risperiDONE   Vital Signs    Vitals:   12/12/20 0416 12/12/20 0442 12/12/20 0726 12/12/20 0757  BP: 123/63  (!) 152/69   Pulse: (!) 59  65   Resp: 18  17   Temp: 97.7 F (36.5 C)  98 F (36.7 C)   TempSrc:      SpO2: 100%  100% 99%  Weight:  83.6 kg    Height:        Intake/Output Summary (Last 24 hours) at 12/12/2020 0830 Last data filed at 12/12/2020 1308 Gross per 24 hour  Intake 129.58 ml  Output 300 ml  Net -170.42 ml   Last 3 Weights 12/12/2020 12/11/2020 12/10/2020  Weight (lbs) 184 lb 4.9 oz 185 lb 10 oz 185 lb 10 oz  Weight (kg) 83.6 kg 84.2 kg 84.2 kg       Telemetry    SR with PVCs - Personally Reviewed  ECG    No new tracings - Personally Reviewed  Physical Exam   GEN: No acute distress.   Neck: No JVD Cardiac: RRR, no murmurs, rubs, or gallops.  Respiratory: Mildly diminished breath sounds throughout. GI: Soft, nontender, non-distended  MS: No edema; No deformity. Neuro:  Aphasic Psych: Normal affect   Labs    High Sensitivity Troponin:   Recent Labs  Lab 12/07/20 1719 12/07/20 1955 12/08/20 2307 12/09/20 0204  TROPONINIHS 95* 96* 127* 149*      Chemistry Recent Labs  Lab 12/07/20 1719 12/08/20 0657 12/09/20 0204 12/11/20 0536  NA 139 141 137 137  K 4.1 3.8 4.6 3.8  CL 103 107 100 103  CO2 29 23 24 26   GLUCOSE 202* 184* 216* 158*  BUN 24* 24* 29* 35*  CREATININE 1.40* 1.35* 1.70* 1.32*  CALCIUM 8.5* 8.3* 8.5* 8.4*  PROT 6.7  --   --   --   ALBUMIN 3.7  --   --   --   AST 20  --   --   --   ALT <5  --   --   --  ALKPHOS 52  --   --   --   BILITOT 0.6  --   --   --   GFRNONAA 48* 50* 38* 52*  ANIONGAP 7 11 13 8      Hematology Recent Labs  Lab 12/07/20 1719 12/08/20 0657 12/11/20 0536  WBC 7.1 7.0 7.0  RBC 3.16* 3.17* 2.84*  HGB 9.9* 9.8* 8.7*  HCT 31.3* 30.8* 27.2*  MCV 99.1 97.2 95.8  MCH 31.3 30.9 30.6  MCHC 31.6 31.8 32.0  RDW 12.8 12.8 12.9  PLT 199 183 175    BNP Recent Labs  Lab 12/08/20 0657  BNP 699.5*     DDimer No results for input(s): DDIMER in the last 168 hours.   Radiology    No new studies  Cardiac Studies   TTE (11/18/2019): 1. Left ventricular ejection fraction, by estimation, is 50 to 55%. The  left ventricle has low normal function. The left ventricle has no regional  wall motion abnormalities. There is moderate left ventricular hypertrophy.  Left ventricular diastolic  parameters are indeterminate.   2. Right ventricular systolic function is normal. The right ventricular  size is normal. There is normal pulmonary artery systolic pressure.   3. Left  atrial size was moderately dilated.   4. Mild mitral valve regurgitation.   5. There is dilatation of the ascending aorta measuring 39 mm.  __________  2D echo 12/11/2020: 1. Left ventricular ejection fraction, by estimation, is 45 to 50%. The  left ventricle has mildly decreased function. The left ventricle  demonstrates global hypokinesis. The left ventricular internal cavity size  was mildly dilated. There is moderate  left ventricular hypertrophy. Left ventricular diastolic parameters are  consistent with Grade II diastolic dysfunction (pseudonormalization).  Elevated left ventricular end-diastolic pressure.   2. Right ventricular systolic function is mildly reduced. The right  ventricular size is mildly enlarged.   3. Left atrial size was mildly dilated.   4. Right atrial size was mildly dilated.   5. The mitral valve is abnormal. Mild mitral valve regurgitation. No  evidence of mitral stenosis. Moderate mitral annular calcification.   6. The aortic valve is tricuspid. There is moderate calcification of the  aortic valve. Aortic valve regurgitation is not visualized. Mild to  moderate aortic valve stenosis.   7. Aortic dilatation noted. There is mild dilatation of the ascending  aorta, measuring 38 mm.   8. The inferior vena cava is normal in size with <50% respiratory  variability, suggesting right atrial pressure of 8 mmHg.   Patient Profile     87 y.o. 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  Assessment & Plan    Atrial fibrillation/flutter: He underwent successful cardioversion from atrial flutter to sinus rhythm on 7/8 and is maintaining sinus  rhythm on rounds on 7/9. Continue oral amiodarone to complete 10 g load. Continue apixaban 5 mg twice daily. Continue metoprolol tartrate 25 mg twice daily.  Acute on chronic respiratory failure with hypoxia/systolic dysfunction: Patient does not appear volume overloaded on exam.  Last echo a year ago showed low normal LVEF. Repeat echocardiogram this admission shows mild systolic dysfunction with an EF of 50%, possibly in the setting of his tachy-arrhythmia. Continue metoprolol, recommend repeat echo in the outpatient setting following  optimization of GDMT and in the context of sinus rhythm following DCCV.  With AKI, defer ACEi/ARB/ARNI/MRA/SGLT2i. Add BiDil. Wean oxygen, as tolerated. Continue ceftriaxone and doxycycline for possible right lower lobe pneumonia per internal medicine.  Elevated troponin and coronary artery disease: Suspect elevated troponin reflects demand ischemia.  EKG without acute ischemic changes but evidence of prior anterolateral and inferior infarct. Echocardiogram as above.  Consider outpatient ischemia evaluation if his systolic dysfunction persists following repeat echo in several months in sinus rhythm and following optimization of GDMT. This will need to be discussed with family given his comorbid conditions and for consent.   Continue atorvastatin for secondary prevention.  Acute kidney injury: Potentially due to intravascular volume depletion.  Patient does not appear volume overloaded today. Improving. Continue to hold diuretics. Avoid nephrotoxic drugs.   Anemia: HGB 9.8 to 8.7 yesterday Check CBC on [place]  For questions or updates, please contact [place] Please consult [email or web address] for contact info under [facility].     Signed, Christell Faith, PA-C  12/12/2020, 8:30 AM    No complaints aphasia lungs shallow inspiration no murmurs Telemetry shows NSR post [facility] On Friday Continue current medications Cardiac status is stable  [name] Rouge MD  [facility]

## 2020-12-12 NOTE — Plan of Care (Signed)
  Problem: Education: Goal: Knowledge of General Education information will improve Description: Including pain rating scale, medication(s)/side effects and non-pharmacologic comfort measures 12/12/2020 1147 by Cristela Blue, RN Outcome: Progressing 12/12/2020 1146 by Cristela Blue, RN Outcome: Progressing   Problem: Health Behavior/Discharge Planning: Goal: Ability to manage health-related needs will improve 12/12/2020 1147 by Cristela Blue, RN Outcome: Progressing 12/12/2020 1146 by Cristela Blue, RN Outcome: Progressing   Problem: Clinical Measurements: Goal: Ability to maintain clinical measurements within normal limits will improve 12/12/2020 1147 by Cristela Blue, RN Outcome: Progressing 12/12/2020 1146 by Cristela Blue, RN Outcome: Progressing Goal: Will remain free from infection 12/12/2020 1147 by Cristela Blue, RN Outcome: Progressing 12/12/2020 1146 by Cristela Blue, RN Outcome: Progressing Goal: Diagnostic test results will improve 12/12/2020 1147 by Cristela Blue, RN Outcome: Progressing 12/12/2020 1146 by Cristela Blue, RN Outcome: Progressing Goal: Respiratory complications will improve 12/12/2020 1147 by Cristela Blue, RN Outcome: Progressing 12/12/2020 1146 by Cristela Blue, RN Outcome: Progressing Goal: Cardiovascular complication will be avoided 12/12/2020 1147 by Cristela Blue, RN Outcome: Progressing 12/12/2020 1146 by Cristela Blue, RN Outcome: Progressing   Problem: Activity: Goal: Risk for activity intolerance will decrease 12/12/2020 1147 by Cristela Blue, RN Outcome: Progressing 12/12/2020 1146 by Cristela Blue, RN Outcome: Progressing   Problem: Nutrition: Goal: Adequate nutrition will be maintained 12/12/2020 1147 by Cristela Blue, RN Outcome: Progressing 12/12/2020 1146 by Cristela Blue, RN Outcome: Progressing   Problem: Coping: Goal: Level of anxiety will decrease 12/12/2020 1147 by Cristela Blue, RN Outcome:  Progressing 12/12/2020 1146 by Cristela Blue, RN Outcome: Progressing   Problem: Elimination: Goal: Will not experience complications related to bowel motility 12/12/2020 1147 by Cristela Blue, RN Outcome: Progressing 12/12/2020 1146 by Cristela Blue, RN Outcome: Progressing Goal: Will not experience complications related to urinary retention 12/12/2020 1147 by Cristela Blue, RN Outcome: Progressing 12/12/2020 1146 by Cristela Blue, RN Outcome: Progressing   Problem: Pain Managment: Goal: General experience of comfort will improve 12/12/2020 1147 by Cristela Blue, RN Outcome: Progressing 12/12/2020 1146 by Cristela Blue, RN Outcome: Progressing   Problem: Safety: Goal: Ability to remain free from injury will improve 12/12/2020 1147 by Cristela Blue, RN Outcome: Progressing 12/12/2020 1146 by Cristela Blue, RN Outcome: Progressing   Problem: Skin Integrity: Goal: Risk for impaired skin integrity will decrease 12/12/2020 1147 by Cristela Blue, RN Outcome: Progressing 12/12/2020 1146 by Cristela Blue, RN Outcome: Progressing   Problem: Education: Goal: Knowledge of disease or condition will improve 12/12/2020 1147 by Cristela Blue, RN Outcome: Progressing 12/12/2020 1146 by Cristela Blue, RN Outcome: Progressing Goal: Understanding of medication regimen will improve 12/12/2020 1147 by Cristela Blue, RN Outcome: Progressing 12/12/2020 1146 by Cristela Blue, RN Outcome: Progressing Goal: Individualized Educational Video(s) 12/12/2020 1147 by Cristela Blue, RN Outcome: Progressing 12/12/2020 1146 by Cristela Blue, RN Outcome: Progressing   Problem: Activity: Goal: Ability to tolerate increased activity will improve 12/12/2020 1147 by Cristela Blue, RN Outcome: Progressing 12/12/2020 1146 by Cristela Blue, RN Outcome: Progressing   Problem: Cardiac: Goal: Ability to achieve and maintain adequate cardiopulmonary perfusion will improve 12/12/2020 1147 by Cristela Blue, RN Outcome: Progressing 12/12/2020 1146 by Cristela Blue, RN Outcome: Progressing   Problem: Health Behavior/Discharge Planning: Goal: Ability to safely manage health-related needs after discharge will improve 12/12/2020 1147 by Cristela Blue, RN Outcome: Progressing 12/12/2020 1146 by Cristela Blue, RN Outcome: Progressing

## 2020-12-13 ENCOUNTER — Telehealth: Payer: Self-pay

## 2020-12-13 LAB — BASIC METABOLIC PANEL
Anion gap: 4 — ABNORMAL LOW (ref 5–15)
BUN: 23 mg/dL (ref 8–23)
CO2: 29 mmol/L (ref 22–32)
Calcium: 8.4 mg/dL — ABNORMAL LOW (ref 8.9–10.3)
Chloride: 104 mmol/L (ref 98–111)
Creatinine, Ser: 1.03 mg/dL (ref 0.61–1.24)
GFR, Estimated: >60 mL/min (ref >60)
Glucose, Bld: 154 mg/dL — ABNORMAL HIGH (ref 70–99)
Potassium: 4 mmol/L (ref 3.5–5.1)
Sodium: 137 mmol/L (ref 135–145)

## 2020-12-13 LAB — CULTURE, BLOOD (ROUTINE X 2)
Culture: NO GROWTH
Culture: NO GROWTH
Special Requests: ADEQUATE
Special Requests: ADEQUATE

## 2020-12-13 LAB — GLUCOSE, CAPILLARY
Glucose-Capillary: 161 mg/dL — ABNORMAL HIGH (ref 70–99)
Glucose-Capillary: 201 mg/dL — ABNORMAL HIGH (ref 70–99)
Glucose-Capillary: 173 mg/dL — ABNORMAL HIGH (ref 70–99)
Glucose-Capillary: 198 mg/dL — ABNORMAL HIGH (ref 70–99)
Glucose-Capillary: 190 mg/dL — ABNORMAL HIGH (ref 70–99)

## 2020-12-13 LAB — RESP PANEL BY RT-PCR (FLU A&B, COVID) ARPGX2
Influenza A by PCR: NEGATIVE
Influenza B by PCR: NEGATIVE
SARS Coronavirus 2 by RT PCR: NEGATIVE

## 2020-12-13 NOTE — Progress Notes (Signed)
Pt is alert, unable to assess orientation due to aphasia. VS stable, NSR on the monitor. No complaints of pain or discomfort, was awake until about 2 am today, then tried to rest. No complaints/cues of pain. 4L [place] in place. IV in place, one removed in L wrist. External cath in place

## 2020-12-13 NOTE — Progress Notes (Signed)
Patient ID: Todd [name], male   DOB: 14-May-1931, 87 y.o.   MRN: [id number] [facility] PROGRESS NOTE  [name] KKX:[id number] DOB: 15-Sep-1930 DOA: 12/07/2020 PCP: Park Liter P, DO  HPI/Subjective: Patient's mental status is much improved this morning.  Eating breakfast.  Held medications for sleep at night.  Initially admitted with altered mental status and found to be in atrial fibrillation.  Objective: Vitals:   12/13/20 1442 12/13/20 1601  BP:  (!) 157/72  Pulse: 68 71  Resp: 18 19  Temp:  97.8 F (36.6 C)  SpO2: 99% 100%    Intake/Output Summary (Last 24 hours) at 12/13/2020 1601 Last data filed at 12/13/2020 1459 Gross per 24 hour  Intake 720 ml  Output 1250 ml  Net -530 ml   Filed Weights   12/11/20 0500 12/12/20 0442 12/13/20 0549  Weight: 84.2 kg 83.6 kg 84.3 kg    ROS: Review of Systems  Respiratory:  Negative for shortness of breath.   Cardiovascular:  Negative for chest pain.  Gastrointestinal:  Negative for abdominal pain, nausea and vomiting.  Exam: Physical Exam HENT:     Head: Normocephalic.     Mouth/Throat:     Pharynx: No oropharyngeal exudate.  Eyes:     General: Lids are normal.     Conjunctiva/sclera: Conjunctivae normal.     Pupils: Pupils are equal, round, and reactive to light.  Cardiovascular:     Rate and Rhythm: Normal rate and regular rhythm.     Heart sounds: Normal heart sounds, S1 normal and S2 normal.  Pulmonary:     Breath sounds: No decreased breath sounds, wheezing, rhonchi or rales.  Abdominal:     Palpations: Abdomen is soft.     Tenderness: There is no abdominal tenderness.  Musculoskeletal:     Right ankle: Swelling present.     Left ankle: Swelling present.  Skin:    General: Skin is warm.     Findings: No rash.  Neurological:     Mental Status: He is alert.     Comments: Left arm tremor     Data Reviewed: Basic Metabolic Panel: Recent Labs  Lab 12/07/20 1719 12/08/20 0657 12/08/20 0657  12/08/20 2307 12/09/20 0204 12/10/20 0559 12/11/20 0536 12/12/20 0629 12/13/20 0446  NA 139 141  --   --  137  --  137  --  137  K 4.1 3.8  --   --  4.6  --  3.8  --  4.0  CL 103 107  --   --  100  --  103  --  104  CO2 29 23  --   --  24  --  26  --  29  GLUCOSE 202* 184*  --   --  216*  --  158*  --  154*  BUN 24* 24*  --   --  29*  --  35*  --  23  CREATININE 1.40* 1.35*  --   --  1.70*  --  1.32*  --  1.03  CALCIUM 8.5* 8.3*  --   --  8.5*  --  8.4*  --  8.4*  MG  --  1.4*   < > 1.9 2.0 1.9 2.0 2.0  --    < > = values in this interval not displayed.   Liver Function Tests: Recent Labs  Lab 12/07/20 1719  AST 20  ALT <5  ALKPHOS 52  BILITOT 0.6  PROT 6.7  ALBUMIN 3.7  CBC: Recent Labs  Lab 12/07/20 1719 12/08/20 0657 12/11/20 0536 12/12/20 0847  WBC 7.1 7.0 7.0 6.3  NEUTROABS 4.4  --   --   --   HGB 9.9* 9.8* 8.7* 9.6*  HCT 31.3* 30.8* 27.2* 30.6*  MCV 99.1 97.2 95.8 97.8  PLT 199 183 175 191    BNP (last 3 results) Recent Labs    12/08/20 0657  BNP 699.5*     CBG: Recent Labs  Lab 12/12/20 1151 12/12/20 1605 12/12/20 2108 12/13/20 0719 12/13/20 1105  GLUCAP 132* 193* 218* 161* 201*    Recent Results (from the past 240 hour(s))  Resp Panel by RT-PCR (Flu A&B, Covid) Nasopharyngeal Swab     Status: None   Collection Time: 12/07/20  8:40 PM   Specimen: Nasopharyngeal Swab; Nasopharyngeal(NP) swabs in vial transport medium  Result Value Ref Range Status   SARS Coronavirus 2 by RT PCR NEGATIVE NEGATIVE Final    Comment: (NOTE) SARS-CoV-2 target nucleic acids are NOT DETECTED.  The SARS-CoV-2 RNA is generally detectable in upper respiratory specimens during the acute phase of infection. The lowest concentration of SARS-CoV-2 viral copies this assay can detect is 138 copies/mL. A negative result does not preclude SARS-Cov-2 infection and should not be used as the sole basis for treatment or other patient management decisions. A negative  result may occur with  improper specimen collection/handling, submission of specimen other than nasopharyngeal swab, presence of viral mutation(s) within the areas targeted by this assay, and inadequate number of viral copies(<138 copies/mL). A negative result must be combined with clinical observations, patient history, and epidemiological information. The expected result is Negative.  Fact Sheet for Patients:  EntrepreneurPulse.com.au  Fact Sheet for Healthcare Providers:  IncredibleEmployment.be  This test is no t yet approved or cleared by the [place] FDA and  has been authorized for detection and/or diagnosis of SARS-CoV-2 by FDA under an Emergency Use Authorization (EUA). This EUA will remain  in effect (meaning this test can be used) for the duration of the COVID-19 declaration under Section 564(b)(1) of the Act, 21 U.S.C.section 360bbb-3(b)(1), unless the authorization is terminated  or revoked sooner.       Influenza A by PCR NEGATIVE NEGATIVE Final   Influenza B by PCR NEGATIVE NEGATIVE Final    Comment: (NOTE) The Xpert Xpress SARS-CoV-2/FLU/RSV plus assay is intended as an aid in the diagnosis of influenza from Nasopharyngeal swab specimens and should not be used as a sole basis for treatment. Nasal washings and aspirates are unacceptable for Xpert Xpress SARS-CoV-2/FLU/RSV testing.  Fact Sheet for Patients: EntrepreneurPulse.com.au  Fact Sheet for Healthcare Providers: IncredibleEmployment.be  This test is not yet approved or cleared by the [place] FDA and has been authorized for detection and/or diagnosis of SARS-CoV-2 by FDA under an Emergency Use Authorization (EUA). This EUA will remain in effect (meaning this test can be used) for the duration of the COVID-19 declaration under Section 564(b)(1) of the Act, 21 U.S.C. section 360bbb-3(b)(1), unless the authorization is  terminated or revoked.  Performed at [facility], [address] [place], [place] [address]   Culture, blood (Routine X 2) w Reflex to ID Panel     Status: None   Collection Time: 12/08/20  7:04 AM   Specimen: BLOOD  Result Value Ref Range Status   Specimen Description BLOOD LEFT ANTECUBITAL  Final   Special Requests   Final    BOTTLES DRAWN AEROBIC AND ANAEROBIC Blood Culture adequate volume   Culture  Final    NO GROWTH 5 DAYS Performed at [facility], [address] [place], [place] [address]    Report Status 12/13/2020 FINAL  Final  Culture, blood (Routine X 2) w Reflex to ID Panel     Status: None   Collection Time: 12/08/20  7:05 AM   Specimen: BLOOD  Result Value Ref Range Status   Specimen Description BLOOD BLOOD LEFT HAND  Final   Special Requests   Final    BOTTLES DRAWN AEROBIC AND ANAEROBIC Blood Culture adequate volume   Culture   Final    NO GROWTH 5 DAYS Performed at [facility], [address], [place], [place] [address]    Report Status 12/13/2020 FINAL  Final  MRSA Next Gen by PCR, Nasal     Status: None   Collection Time: 12/09/20 12:17 AM   Specimen: Nasal Mucosa; Nasal Swab  Result Value Ref Range Status   MRSA by PCR Next Gen NOT DETECTED NOT DETECTED Final    Comment: (NOTE) The GeneXpert MRSA Assay (FDA approved for NASAL specimens only), is one component of a comprehensive MRSA colonization surveillance program. It is not intended to diagnose MRSA infection nor to guide or monitor treatment for MRSA infections. Test performance is not FDA approved in patients less than 26 years old. Performed at [facility], [address] [place], [place] [address]   Resp Panel by RT-PCR (Flu A&B, Covid) Nasopharyngeal Swab     Status: None   Collection Time: 12/13/20 10:34 AM   Specimen: Nasopharyngeal Swab; Nasopharyngeal(NP) swabs in vial transport medium  Result Value Ref Range Status   SARS Coronavirus 2 by  RT PCR NEGATIVE NEGATIVE Final    Comment: (NOTE) SARS-CoV-2 target nucleic acids are NOT DETECTED.  The SARS-CoV-2 RNA is generally detectable in upper respiratory specimens during the acute phase of infection. The lowest concentration of SARS-CoV-2 viral copies this assay can detect is 138 copies/mL. A negative result does not preclude SARS-Cov-2 infection and should not be used as the sole basis for treatment or other patient management decisions. A negative result may occur with  improper specimen collection/handling, submission of specimen other than nasopharyngeal swab, presence of viral mutation(s) within the areas targeted by this assay, and inadequate number of viral copies(<138 copies/mL). A negative result must be combined with clinical observations, patient history, and epidemiological information. The expected result is Negative.  Fact Sheet for Patients:  EntrepreneurPulse.com.au  Fact Sheet for Healthcare Providers:  IncredibleEmployment.be  This test is no t yet approved or cleared by the [place] FDA and  has been authorized for detection and/or diagnosis of SARS-CoV-2 by FDA under an Emergency Use Authorization (EUA). This EUA will remain  in effect (meaning this test can be used) for the duration of the COVID-19 declaration under Section 564(b)(1) of the Act, 21 U.S.C.section 360bbb-3(b)(1), unless the authorization is terminated  or revoked sooner.       Influenza A by PCR NEGATIVE NEGATIVE Final   Influenza B by PCR NEGATIVE NEGATIVE Final    Comment: (NOTE) The Xpert Xpress SARS-CoV-2/FLU/RSV plus assay is intended as an aid in the diagnosis of influenza from Nasopharyngeal swab specimens and should not be used as a sole basis for treatment. Nasal washings and aspirates are unacceptable for Xpert Xpress SARS-CoV-2/FLU/RSV testing.  Fact Sheet for Patients: EntrepreneurPulse.com.au  Fact Sheet  for Healthcare Providers: IncredibleEmployment.be  This test is not yet approved or cleared by the [place] FDA and has been  authorized for detection and/or diagnosis of SARS-CoV-2 by FDA under an Emergency Use Authorization (EUA). This EUA will remain in effect (meaning this test can be used) for the duration of the COVID-19 declaration under Section 564(b)(1) of the Act, 21 U.S.C. section 360bbb-3(b)(1), unless the authorization is terminated or revoked.  Performed at [facility], [address] [place], [place] [address]      Studies: CT HEAD WO CONTRAST  Result Date: 12/12/2020 CLINICAL DATA:  Mental status change with unknown cause EXAM: CT HEAD WITHOUT CONTRAST TECHNIQUE: Contiguous axial images were obtained from the base of the skull through the vertex without intravenous contrast. COMPARISON:  04/20/2018 FINDINGS: Brain: No evidence of acute infarction, hemorrhage, hydrocephalus, extra-axial collection or mass lesion/mass effect. Atrophy and confluent chronic small vessel ischemia in the hemispheric white matter. Remote left pontine infarct. Vascular: No hyperdense vessel or unexpected calcification. Skull: Normal. Negative for fracture or focal lesion. Sinuses/Orbits: No acute finding. Other: Notable streak artifact across the ventral brain. IMPRESSION: 1. No acute finding or change from 2019. 2. Chronic small vessel ischemia including remote left pontine infarct. Electronically Signed   By: Monte Fantasia M.D.   On: 12/12/2020 11:25   DG Chest Port 1 View  Result Date: 12/12/2020 CLINICAL DATA:  Wheezing. EXAM: PORTABLE CHEST 1 VIEW COMPARISON:  12/08/2020 FINDINGS: Stable enlarged cardiac silhouette and elevation of the right hemidiaphragm. Stable linear scarring or atelectasis in the right mid lung zone. Thoracic spine and bilateral shoulder degenerative changes. IMPRESSION: No acute abnormality. Electronically Signed   By: Claudie Revering M.D.    On: 12/12/2020 16:25    Scheduled Meds:  amiodarone  400 mg Oral BID   apixaban  5 mg Oral BID   budesonide (PULMICORT) nebulizer solution  0.5 mg Nebulization BID   carbidopa-levodopa  1 tablet Oral TID AC   doxycycline  100 mg Oral Q12H   DULoxetine  30 mg Oral Daily   insulin aspart  0-9 Units Subcutaneous TID WC   ipratropium-albuterol  3 mL Nebulization TID   isosorbide-hydrALAZINE  0.5 tablet Oral TID   latanoprost  1 drop Both Eyes QHS   magnesium oxide  400 mg Oral BID   metoprolol tartrate  25 mg Oral BID   polyethylene glycol  17 g Oral Daily   sodium chloride flush  3 mL Intravenous Q12H   Continuous Infusions:  sodium chloride Stopped (12/09/20 1234)    Assessment/Plan:  Acute metabolic encephalopathy and lethargy.  This has improved.  Mental status back to baseline.  CT scan of the head negative, ABG did not show CO2 retention.  Chest x-ray repeat was negative.  Mental status better after discontinuing any medications that can cause altered mental status. Paroxysmal atrial fibrillation cardioverted on 12/10/2020 and remains in normal sinus rhythm.  Continue oral amiodarone and Eliquis. Right lower lobe pneumonia.  Completed 5 days of antibiotics and discontinue. Acute kidney injury on chronic kidney disease stage II.  Creatinine peaked at 1.7 and came down to 1.03. Prior history of ischemic stroke with baseline right-sided weakness Hypomagnesemia.  Replaced during the hospital course Type 2 diabetes mellitus with hyperlipidemia.  Holding atorvastatin with mental status change will restart upon disposition.  Holding Jardiance and metformin while here in the hospital Anemia of chronic disease.  Last hemoglobin 9.6 Chronic hypoxic respiratory failure on 4 L of oxygen Constipation on MiraLAX and as needed Dulcolax pills Anxiety depression on Cymbalta Lactic acidosis holding Glucophage Weakness.  Physical therapy recommending rehab.  Awaiting insurance  authorization since I  did not get it yet today likely will discharge tomorrow if we get insurance authorization.        Code Status:     Code Status Orders  (From admission, onward)           Start     Ordered   12/07/20 2334  Full code  Continuous        12/07/20 2335           Code Status History     Date Active Date Inactive Code Status Order ID Comments User Context   11/11/2018 1817 11/17/2018 1950 Full Code [id number]  Gorden Harms, MD Inpatient   04/20/2018 2122 04/23/2018 1849 Full Code [id number]  Gladstone Lighter, MD Inpatient   05/18/2017 0303 05/21/2017 1928 Full Code [id number]  Lance Coon, MD Inpatient   03/17/2017 0419 03/17/2017 2109 Full Code [id number]  Saundra Shelling, MD ED   03/05/2015 0019 03/05/2015 1507 Full Code [id number]  Lance Coon, MD Inpatient      Family Communication: Spoke with daughter at the bedside Disposition Plan: Status is: Inpatient  Dispo: The patient is from: Home              Anticipated d/c is to: Rehab              Patient currently medically stable to go out to rehab once insurance authorization obtained   Difficult to place patient.  No.  Consultants: Cardiology  Time spent: 27 minutes  [place]

## 2020-12-13 NOTE — Progress Notes (Signed)
Physical Therapy Treatment Patient Details Name: Todd [name] MRN: [id number] DOB: [birth year] Today's Date: 12/13/2020    History of Present Illness Todd [name] is an 49yoM who comes to [facility] on 09/07/20 c fatigue, weakness, anorrexia. 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 PMH: PAF on eliquis and digoxin, CAD, CVA c Rt hemiplegia, CKD3a, DM2, HTN, HLD, chronic anemia. Pt adm for Afib with RVR. T/f to SDU on 12/08/20 for closer monitoring d/t increasd HR and increased O2 needs. Pt now s/p cardioversion and is in NSR.    PT Comments    Pt in bed upon entry, appears somewhat distressed, but due to chronic expressive aphasia, difficult to determine origin of woes. It is discovered that patient is on bedpan, is agreeable to come off, reports finished, no bowel in sight. Pt appears more comfortable after bedpan disengaged. Pt is more alert today that prior day, more similar to other episodes. ModA to EOB, follows multimodal cues for exercise at EOB, but needs tactile reinforcement for accuracy. Pt agreeable to practice standing again, moved to elevated surface as he uses a lift chair at baseline. minA to rise from EOB twice, then thrice with RW and minA. Pt assisted back to bed at end of session, [name] (DTR) returns to room at EOS. Lunch tray arriving at authors exit. [name] confirms she is agreeable to change in DC recs previous day to STR, concerned about pts gradual decline in independence over the last year, family would be better able to care for patient in home is he required less physical assistance.     Follow Up Recommendations  SNF;Supervision for mobility/OOB     Equipment Recommendations  Other (comment) (transport chair to assist with in-home mobility)    Recommendations for Other Services       Precautions / Restrictions Precautions Precautions: Fall Restrictions Weight Bearing Restrictions: No    Mobility  Bed Mobility Overal bed mobility: Needs Assistance Bed Mobility:  Supine to Sit;Sit to Supine     Supine to sit: Mod assist Sit to supine: Max assist   General bed mobility comments: able to sit at EOB without any balance difficulty, frequently reaches down to feet to pull socks up as in prior sessions    Transfers Overall transfer level: Needs assistance Equipment used: Rolling walker (2 wheeled)   Sit to Stand: Min assist;From elevated surface         General transfer comment: 5x from elevated EOB, 3x c RW; able to remain standing x15-20 sec each time prior to fatigue. Pt maintaisn forward flexed posture as per baseline per family report.  Ambulation/Gait Ambulation/Gait assistance:  (deferred to later date/time.)               Stairs             Wheelchair Mobility    Modified Rankin (Stroke Patients Only)       Balance Overall balance assessment: Modified Independent   Sitting balance-Leahy Scale: Good     Standing balance support: Bilateral upper extremity supported;During functional activity Standing balance-Leahy Scale: Fair                              Cognition Arousal/Alertness: Lethargic Behavior During Therapy: WFL for tasks assessed/performed Overall Cognitive Status: History of cognitive impairments - at baseline  Exercises General Exercises - Lower Extremity Long Arc Quad: AROM;Both;15 reps;Limitations Long [employer] Limitations: extensive cues to perform on correct side, keeps coming back to right side.    General Comments        Pertinent Vitals/Pain Pain Assessment: No/denies pain    Home Living                      Prior Function            PT Goals (current goals can now be found in the care plan section) Acute Rehab PT Goals Patient Stated Goal: none stated PT Goal Formulation: Patient unable to participate in goal setting Time For Goal Achievement: 12/22/20 Potential to Achieve Goals: Fair Progress  towards PT goals: Progressing toward goals    Frequency    Min 2X/week      PT Plan Current plan remains appropriate    Co-evaluation              AM-PAC PT "6 Clicks" Mobility   Outcome Measure  Help needed turning from your back to your side while in a flat bed without using bedrails?: A Lot Help needed moving from lying on your back to sitting on the side of a flat bed without using bedrails?: A Lot Help needed moving to and from a bed to a chair (including a wheelchair)?: Total Help needed standing up from a chair using your arms (e.g., wheelchair or bedside chair)?: Total Help needed to walk in hospital room?: A Lot Help needed climbing 3-5 steps with a railing? : A Lot 6 Click Score: 10    End of Session Equipment Utilized During Treatment: Oxygen Activity Tolerance: Patient tolerated treatment well;No increased pain Patient left: in bed;with bed alarm set;with family/visitor present;with call bell/phone within reach Nurse Communication: Mobility status PT Visit Diagnosis: Difficulty in walking, not elsewhere classified (R26.2);Other abnormalities of gait and mobility (R26.89);Muscle weakness (generalized) (M62.81)     Time: 3212-2482 PT Time Calculation (min) (ACUTE ONLY): 21 min  Charges:  $Therapeutic Exercise: 8-22 mins                    12:18 PM, 12/13/20 Etta Grandchild, PT, DPT Physical Therapist - [facility]  [phone] ([place])     Liborio Negron Torres C 12/13/2020, 12:11 PM

## 2020-12-13 NOTE — Care Management Important Message (Signed)
Important Message  Patient Details  Name: Todd [name] MRN: [id number] Date of Birth: 1931/04/13   Medicare Important Message Given:  Yes     Dannette Barbara 12/13/2020, 11:44 AM

## 2020-12-13 NOTE — Telephone Encounter (Signed)
Advised patient provider is out of the office this week so may need to ask for an extension on due date, patient understood and will fax over forms.

## 2020-12-13 NOTE — Telephone Encounter (Signed)
Copied from [place] [phone]. Topic: General - Other >> Dec 13, 2020 12:44 PM Celene Kras wrote: Reason for CRM: Pts daughter, Somtochukwu Woollard, calling stating that she is needing to have FMLA paperwork filled out for her. She states that she would be glad to schedule an appt if needed, or fax it. She is requesting to have a response due to her having to have paperwork by 12/24/20 . Please advise.   Would pt need apt for daughter to have FMLA for her filled?

## 2020-12-13 NOTE — Chronic Care Management (AMB) (Signed)
Error

## 2020-12-13 NOTE — Progress Notes (Addendum)
Progress Note  Patient Name: Todd [name] Date of Encounter: 12/13/2020  [facility] Cardiologist: Ida Rogue, MD   Subjective   CT head yesterday showed no acute findings and CXR unremarkable.  Kidney function improved. Hgb stable at 9.6. Accompanied by daughter this AM, she helped feed patient. Patient aphasic, worse than normal per daughter report. He remains in SR. Plan for discharge to OP rehab.   Inpatient Medications    Scheduled Meds:  amiodarone  400 mg Oral BID   apixaban  5 mg Oral BID   budesonide (PULMICORT) nebulizer solution  0.5 mg Nebulization BID   carbidopa-levodopa  1 tablet Oral TID AC   Chlorhexidine Gluconate Cloth  6 each Topical Q0600   doxycycline  100 mg Oral Q12H   DULoxetine  30 mg Oral Daily   insulin aspart  0-9 Units Subcutaneous TID WC   ipratropium-albuterol  3 mL Nebulization TID   isosorbide-hydrALAZINE  0.5 tablet Oral TID   latanoprost  1 drop Both Eyes QHS   magnesium oxide  400 mg Oral BID   metoprolol tartrate  25 mg Oral BID   polyethylene glycol  17 g Oral Daily   sodium chloride flush  3 mL Intravenous Q12H   Continuous Infusions:  sodium chloride Stopped (12/09/20 1234)   cefTRIAXone (ROCEPHIN)  IV 2 g (12/13/20 0833)   PRN Meds: acetaminophen **OR** acetaminophen, bisacodyl, lactulose, ondansetron **OR** ondansetron (ZOFRAN) IV   Vital Signs    Vitals:   12/13/20 0439 12/13/20 0549 12/13/20 0717 12/13/20 0758  BP: 119/67  (!) 158/82   Pulse: 65  72 70  Resp: 18  18 16   Temp: 97.8 F (36.6 C)  97.9 F (36.6 C)   TempSrc:   Oral   SpO2: 95%  100% 96%  Weight:  84.3 kg    Height:        Intake/Output Summary (Last 24 hours) at 12/13/2020 0910 Last data filed at 12/12/2020 1821 Gross per 24 hour  Intake 480 ml  Output 350 ml  Net 130 ml   Last 3 Weights 12/13/2020 12/12/2020 12/11/2020  Weight (lbs) 185 lb 14.4 oz 184 lb 4.9 oz 185 lb 10 oz  Weight (kg) 84.324 kg 83.6 kg 84.2 kg      Telemetry    SR, HR  60-70s, PVCs, brief 1st degree AVblock- Personally Reviewed  ECG    No new - Personally Reviewed  Physical Exam   GEN: No acute distress.   Neck: No JVD Cardiac: RRR, no murmurs, rubs, or gallops.  Respiratory: Clear to auscultation bilaterally. GI: Soft, nontender, non-distended  MS: No edema; No deformity. Neuro:  Aphasic Psych: Normal affect   Labs    High Sensitivity Troponin:   Recent Labs  Lab 12/07/20 1719 12/07/20 1955 12/08/20 2307 12/09/20 0204  TROPONINIHS 95* 96* 127* 149*      Chemistry Recent Labs  Lab 12/07/20 1719 12/08/20 0657 12/09/20 0204 12/11/20 0536 12/13/20 0446  NA 139   < > 137 137 137  K 4.1   < > 4.6 3.8 4.0  CL 103   < > 100 103 104  CO2 29   < > 24 26 29   GLUCOSE 202*   < > 216* 158* 154*  BUN 24*   < > 29* 35* 23  CREATININE 1.40*   < > 1.70* 1.32* 1.03  CALCIUM 8.5*   < > 8.5* 8.4* 8.4*  PROT 6.7  --   --   --   --  ALBUMIN 3.7  --   --   --   --   AST 20  --   --   --   --   ALT <5  --   --   --   --   ALKPHOS 52  --   --   --   --   BILITOT 0.6  --   --   --   --   GFRNONAA 48*   < > 38* 52* >60  ANIONGAP 7   < > 13 8 4*   < > = values in this interval not displayed.     Hematology Recent Labs  Lab 12/08/20 0657 12/11/20 0536 12/12/20 0847  WBC 7.0 7.0 6.3  RBC 3.17* 2.84* 3.13*  HGB 9.8* 8.7* 9.6*  HCT 30.8* 27.2* 30.6*  MCV 97.2 95.8 97.8  MCH 30.9 30.6 30.7  MCHC 31.8 32.0 31.4  RDW 12.8 12.9 12.8  PLT 183 175 191    BNP Recent Labs  Lab 12/08/20 0657  BNP 699.5*     DDimer No results for input(s): DDIMER in the last 168 hours.   Radiology    CT HEAD WO CONTRAST  Result Date: 12/12/2020 CLINICAL DATA:  Mental status change with unknown cause EXAM: CT HEAD WITHOUT CONTRAST TECHNIQUE: Contiguous axial images were obtained from the base of the skull through the vertex without intravenous contrast. COMPARISON:  04/20/2018 FINDINGS: Brain: No evidence of acute infarction, hemorrhage, hydrocephalus,  extra-axial collection or mass lesion/mass effect. Atrophy and confluent chronic small vessel ischemia in the hemispheric white matter. Remote left pontine infarct. Vascular: No hyperdense vessel or unexpected calcification. Skull: Normal. Negative for fracture or focal lesion. Sinuses/Orbits: No acute finding. Other: Notable streak artifact across the ventral brain. IMPRESSION: 1. No acute finding or change from 2019. 2. Chronic small vessel ischemia including remote left pontine infarct. Electronically Signed   By: Monte Fantasia M.D.   On: 12/12/2020 11:25   DG Chest Port 1 View  Result Date: 12/12/2020 CLINICAL DATA:  Wheezing. EXAM: PORTABLE CHEST 1 VIEW COMPARISON:  12/08/2020 FINDINGS: Stable enlarged cardiac silhouette and elevation of the right hemidiaphragm. Stable linear scarring or atelectasis in the right mid lung zone. Thoracic spine and bilateral shoulder degenerative changes. IMPRESSION: No acute abnormality. Electronically Signed   By: Claudie Revering M.D.   On: 12/12/2020 16:25   ECHOCARDIOGRAM COMPLETE  Result Date: 12/11/2020    ECHOCARDIOGRAM REPORT   Patient Name:   Todd [name] Date of Exam: 12/11/2020 Medical Rec #:  [id number]   Height:       68.0 in Accession #:    [id number]  Weight:       185.6 lb Date of Birth:  December 03, 1930   BSA:          1.980 m Patient Age:    87 years    BP:           130/77 mmHg Patient Gender: M           HR:           62 bpm. Exam Location:  [facility] Procedure: 2D Echo Indications:    chest pain  History:        Patient has prior history of Echocardiogram examinations. Prior                 echo 11/18/19.  Sonographer:    Kathlen Brunswick Referring Phys: [place]  1. Left ventricular ejection fraction, by estimation, is 45  to 50%. The left ventricle has mildly decreased function. The left ventricle demonstrates global hypokinesis. The left ventricular internal cavity size was mildly dilated. There is moderate left ventricular hypertrophy. Left  ventricular diastolic parameters are consistent with Grade II diastolic dysfunction (pseudonormalization). Elevated left ventricular end-diastolic pressure.  2. Right ventricular systolic function is mildly reduced. The right ventricular size is mildly enlarged.  3. Left atrial size was mildly dilated.  4. Right atrial size was mildly dilated.  5. The mitral valve is abnormal. Mild mitral valve regurgitation. No evidence of mitral stenosis. Moderate mitral annular calcification.  6. The aortic valve is tricuspid. There is moderate calcification of the aortic valve. Aortic valve regurgitation is not visualized. Mild to moderate aortic valve stenosis.  7. Aortic dilatation noted. There is mild dilatation of the ascending aorta, measuring 38 mm.  8. The inferior vena cava is normal in size with <50% respiratory variability, suggesting right atrial pressure of 8 mmHg. FINDINGS  Left Ventricle: Left ventricular ejection fraction, by estimation, is 45 to 50%. The left ventricle has mildly decreased function. The left ventricle demonstrates global hypokinesis. The left ventricular internal cavity size was mildly dilated. There is  moderate left ventricular hypertrophy. Left ventricular diastolic parameters are consistent with Grade II diastolic dysfunction (pseudonormalization). Elevated left ventricular end-diastolic pressure. Right Ventricle: The right ventricular size is mildly enlarged. No increase in right ventricular wall thickness. Right ventricular systolic function is mildly reduced. Left Atrium: Left atrial size was mildly dilated. Right Atrium: Right atrial size was mildly dilated. Pericardium: There is no evidence of pericardial effusion. Mitral Valve: The mitral valve is abnormal. There is mild thickening of the mitral valve leaflet(s). There is mild calcification of the mitral valve leaflet(s). Moderate mitral annular calcification. Mild mitral valve regurgitation. No evidence of mitral  valve stenosis.  Tricuspid Valve: The tricuspid valve is normal in structure. Tricuspid valve regurgitation is mild . No evidence of tricuspid stenosis. Aortic Valve: The aortic valve is tricuspid. There is moderate calcification of the aortic valve. Aortic valve regurgitation is not visualized. Mild to moderate aortic stenosis is present. Aortic valve peak gradient measures 4.5 mmHg. Pulmonic Valve: The pulmonic valve was normal in structure. Pulmonic valve regurgitation is mild. No evidence of pulmonic stenosis. Aorta: The aortic root is normal in size and structure and aortic dilatation noted. There is mild dilatation of the ascending aorta, measuring 38 mm. Venous: The inferior vena cava is normal in size with less than 50% respiratory variability, suggesting right atrial pressure of 8 mmHg. IAS/Shunts: No atrial level shunt detected by color flow Doppler.  LEFT VENTRICLE PLAX 2D LVIDd:         4.25 cm     Diastology LVIDs:         3.31 cm     LV e' medial:    3.37 cm/s LV PW:         1.59 cm     LV E/e' medial:  25.8 LV IVS:        1.61 cm     LV e' lateral:   5.00 cm/s LVOT diam:     2.00 cm     LV E/e' lateral: 17.4 LV SV:         32 LV SV Index:   16 LVOT Area:     3.14 cm  LV Volumes (MOD) LV vol d, MOD A2C: 64.7 ml LV vol d, MOD A4C: 49.8 ml LV vol s, MOD A2C: 30.4 ml LV vol s, MOD  A4C: 26.7 ml LV SV MOD A2C:     34.3 ml LV SV MOD A4C:     49.8 ml LV SV MOD BP:      30.5 ml RIGHT VENTRICLE RV Basal diam:  3.49 cm TAPSE (M-mode): 1.6 cm LEFT ATRIUM             Index       RIGHT ATRIUM           Index LA diam:        4.20 cm 2.12 cm/m  RA Area:     17.10 cm LA Vol (A2C):   95.6 ml 48.28 ml/m RA Volume:   45.20 ml  22.83 ml/m LA Vol (A4C):   50.0 ml 25.25 ml/m LA Biplane Vol: 69.8 ml 35.25 ml/m  AORTIC VALVE                PULMONIC VALVE AV Area (Vmax): 1.66 cm    PV Vmax:       0.65 m/s AV Vmax:        106.00 cm/s PV Peak grad:  1.7 mmHg AV Peak Grad:   4.5 mmHg LVOT Vmax:      56.10 cm/s LVOT Vmean:     35.700 cm/s  LVOT VTI:       0.102 m  AORTA Ao Root diam: 3.40 cm Ao Asc diam:  3.80 cm MITRAL VALVE               TRICUSPID VALVE MV Area (PHT): 3.02 cm    TV Peak grad:   45.2 mmHg MV Decel Time: 251 msec    TV Vmax:        3.36 m/s MV E velocity: 87.00 cm/s MV A velocity: 35.10 cm/s  SHUNTS MV E/A ratio:  2.48        Systemic VTI:  0.10 m                            Systemic Diam: 2.00 cm [name] Rouge MD Electronically signed by [name] Rouge MD Signature Date/Time: 12/11/2020/3:12:55 PM    Final     Cardiac Studies   TTE (11/18/2019): 1. Left ventricular ejection fraction, by estimation, is 50 to 55%. The  left ventricle has low normal function. The left ventricle has no regional  wall motion abnormalities. There is moderate left ventricular hypertrophy.  Left ventricular diastolic  parameters are indeterminate.   2. Right ventricular systolic function is normal. The right ventricular  size is normal. There is normal pulmonary artery systolic pressure.   3. Left atrial size was moderately dilated.   4. Mild mitral valve regurgitation.   5. There is dilatation of the ascending aorta measuring 39 mm.  __________   2D echo 12/11/2020: 1. Left ventricular ejection fraction, by estimation, is 45 to 50%. The  left ventricle has mildly decreased function. The left ventricle  demonstrates global hypokinesis. The left ventricular internal cavity size  was mildly dilated. There is moderate  left ventricular hypertrophy. Left ventricular diastolic parameters are  consistent with Grade II diastolic dysfunction (pseudonormalization).  Elevated left ventricular end-diastolic pressure.   2. Right ventricular systolic function is mildly reduced. The right  ventricular size is mildly enlarged.   3. Left atrial size was mildly dilated.   4. Right atrial size was mildly dilated.   5. The mitral valve is abnormal. Mild mitral valve regurgitation. No  evidence of mitral stenosis. Moderate mitral annular calcification.  6. The aortic valve is tricuspid. There is moderate calcification of the  aortic valve. Aortic valve regurgitation is not visualized. Mild to  moderate aortic valve stenosis.   7. Aortic dilatation noted. There is mild dilatation of the ascending  aorta, measuring 38 mm.   8. The inferior vena cava is normal in size with <50% respiratory  variability, suggesting right atrial pressure of 8 mmHg.  Patient Profile     87 y.o. 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   Assessment & Plan    Afib/flutter - he is s/p DCCV to SR 7/8, remains in NSR - continue amiodarone load -stroke prophylaxis with Eliquis 5mg  BID - rate control with metoprolol 25mg  BID  Acute on chronic respiratory failure with hypoxia/systolic dysfunction - Echo this admission showed mild systolic dysfunction with EF 50%, in the setting of tachy-arrhythmia. Last echo 11/2019 showed LVEF 50-55% - continue metoprolol - No ACE/ARB/ARNI/SGLT2i with AKI on presentation, can consider as OP - Bidil added - continue Lopressor - off O2. Recent CXR nonacute - Euvolemic on exam, can restart home lasix  Elevated troponin CAD - HS troponin elevated to 149, suspected demand ischemia in the setting of rapid afib, AMS, PNA, AKI, anemia, electrolyte disorder - EKG without ischemic changes - Normal MPI in 2018 - Echo as above - continue statin - No ASA with chronic anticoagulation  AKI - diuretics held - improved  Anemia - Hgb stable this am  For questions or updates, please contact [facility] Please consult [email or web address] for contact info under        Signed, Abdallah Hern Ninfa Meeker, PA-C  12/13/2020, 9:10 AM

## 2020-12-13 NOTE — Progress Notes (Signed)
Physical Therapy Treatment Patient Details Name: Todd [name] MRN: [id number] DOB: 07-20-1930 Today's Date: 12/13/2020    History of Present Illness Todd [name] is an 16yoM who comes to [facility] on 09/07/20 c fatigue, weakness, anorrexia. 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 PMH: PAF on eliquis and digoxin, CAD, CVA c Rt hemiplegia, CKD3a, DM2, HTN, HLD, chronic anemia. Pt adm for Afib with RVR. T/f to SDU on 12/08/20 for closer monitoring d/t increasd HR and increased O2 needs. Pt now s/p cardioversion and is in NSR.    PT Comments    Author returning for BID therapy session to provide time for gait training/AMB assessment. Pt has been progressing with independence in transfers/bed mobility. Pt given modA to EOB, then minA for SPT to recliner with RW, requires heavy cues for safety due to impulsivity. Pt taken out into hallway, [place] to rise from recliner (lower surface), then after quick balance acquisition, AMB 23ft c RW, minGuard assist, chair follow due to communication deficits and impulsivity. Pt does this 3 times with quick recovery. HR remains <90bpm; O2 maintained at 4L/min. DTR Todd [name] is present during session. Session ended due to time constraints, pt returned to room, agreeable to stay up in recliner. DTR at bedside. Condom cath still attached to patient at exit.    Follow Up Recommendations  SNF;Supervision for mobility/OOB     Equipment Recommendations       Recommendations for Other Services       Precautions / Restrictions Precautions Precautions: Fall Precaution Comments: expressive aphasia, mild impulsivity. Restrictions Weight Bearing Restrictions: No    Mobility  Bed Mobility Overal bed mobility: Needs Assistance Bed Mobility: Supine to Sit     Supine to sit: Min assist          Transfers Overall transfer level: Needs assistance Equipment used: Rolling walker (2 wheeled) Transfers: Stand Pivot Transfers;Sit to/from Stand Sit to Stand: Min  assist;Mod assist;From elevated surface (altered technique due to knee flexion ROM limits at baseline, lift chair at home; requires min-modA for this.) Stand pivot transfers: Min assist (heavy cues for safe sitting;)          Ambulation/Gait Ambulation/Gait assistance: Min assist Gait Distance (Feet): 20 Feet Assistive device: Rolling walker (2 wheeled) Gait Pattern/deviations: Step-to pattern;Trunk flexed     General Gait Details: 3x26ft in hallway, chair follow c PT-tech, DTR carries O2 tank. Pt sits in recliner for 60-90sec rest interval between bouts. Slightly impulsive with sitting, needs heavy cues to wait for chair. Expressive aphasia makes it difficulty for patient to voice his intentions.   Stairs             Wheelchair Mobility    Modified Rankin (Stroke Patients Only)       Balance                                            Cognition Arousal/Alertness: Awake/alert Behavior During Therapy: WFL for tasks assessed/performed Overall Cognitive Status: History of cognitive impairments - at baseline                                 General Comments: mild impulsivity at baseline, but easily redirectable with heavy verbal cuing/narration,      Exercises      General Comments  Pertinent Vitals/Pain Pain Assessment: No/denies pain    Home Living                      Prior Function            PT Goals (current goals can now be found in the care plan section) Acute Rehab PT Goals Patient Stated Goal: require less physical assist with basic ADL mobility PT Goal Formulation: With family Time For Goal Achievement: 12/22/20 Potential to Achieve Goals: Good Progress towards PT goals: Progressing toward goals    Frequency    Min 2X/week      PT Plan Current plan remains appropriate    Co-evaluation              AM-PAC PT "6 Clicks" Mobility   Outcome Measure  Help needed turning from your  back to your side while in a flat bed without using bedrails?: A Lot Help needed moving from lying on your back to sitting on the side of a flat bed without using bedrails?: A Lot Help needed moving to and from a bed to a chair (including a wheelchair)?: Total Help needed standing up from a chair using your arms (e.g., wheelchair or bedside chair)?: Total Help needed to walk in hospital room?: A Little Help needed climbing 3-5 steps with a railing? : A Lot 6 Click Score: 11    End of Session Equipment Utilized During Treatment: Oxygen;Gait belt Activity Tolerance: Patient tolerated treatment well;No increased pain Patient left: in bed;with bed alarm set;with family/visitor present;with call bell/phone within reach Nurse Communication: Mobility status PT Visit Diagnosis: Difficulty in walking, not elsewhere classified (R26.2);Other abnormalities of gait and mobility (R26.89);Muscle weakness (generalized) (M62.81)     Time: 6387-5643 PT Time Calculation (min) (ACUTE ONLY): 28 min  Charges:  $Gait Training: 23-37 mins                    4:21 PM, 12/13/20 Todd [name], PT, DPT Physical Therapist - [facility]  [phone] ([place])    Todd [name] C 12/13/2020, 4:16 PM

## 2020-12-14 DIAGNOSIS — R531 Weakness: Secondary | ICD-10-CM

## 2020-12-14 DIAGNOSIS — I4891 Unspecified atrial fibrillation: Secondary | ICD-10-CM

## 2020-12-14 DIAGNOSIS — I4892 Unspecified atrial flutter: Secondary | ICD-10-CM

## 2020-12-14 DIAGNOSIS — D638 Anemia in other chronic diseases classified elsewhere: Secondary | ICD-10-CM

## 2020-12-14 DIAGNOSIS — I25118 Atherosclerotic heart disease of native coronary artery with other forms of angina pectoris: Secondary | ICD-10-CM

## 2020-12-14 LAB — GLUCOSE, CAPILLARY
Glucose-Capillary: 167 mg/dL — ABNORMAL HIGH (ref 70–99)
Glucose-Capillary: 129 mg/dL — ABNORMAL HIGH (ref 70–99)
Glucose-Capillary: 125 mg/dL — ABNORMAL HIGH (ref 70–99)
Glucose-Capillary: 261 mg/dL — ABNORMAL HIGH (ref 70–99)

## 2020-12-14 MED ORDER — ATORVASTATIN CALCIUM 20 MG PO TABS
20.0000 mg | ORAL_TABLET | Freq: Every day | ORAL | Status: DC
  Administered 2020-12-14 – 2020-12-15 (×2): 20 mg via ORAL
  Filled 2020-12-14 (×2): qty 1

## 2020-12-14 MED ORDER — HALOPERIDOL LACTATE 5 MG/ML IJ SOLN
1.0000 mg | Freq: Four times a day (QID) | INTRAMUSCULAR | Status: DC | PRN
  Administered 2020-12-14: 1 mg via INTRAVENOUS
  Filled 2020-12-14: qty 1

## 2020-12-14 MED ORDER — MELATONIN 5 MG PO TABS
2.5000 mg | ORAL_TABLET | Freq: Every day | ORAL | Status: DC
  Administered 2020-12-14 – 2020-12-16 (×2): 2.5 mg via ORAL
  Filled 2020-12-14 (×2): qty 1

## 2020-12-14 NOTE — Progress Notes (Signed)
Pt is alert, unable to assess orientation due to aphasia. VS stable, NSR on the monitor. No complaints of pain or discomfort. No complaints/cues of pain. 4L [place] in place. IV in place. External cath in place, pt removed multiple times and attempted to get OOB, MD notified who ordered 1 mg haldol, given with relief

## 2020-12-14 NOTE — Progress Notes (Signed)
OT Cancellation Note  Patient Details Name: Todd [name] MRN: [id number] DOB: [birth year]   Cancelled Treatment:    Reason Eval/Treat Not Completed: Fatigue/lethargy limiting ability to participate. Per discussion with RN, pt lethargic following receiving haldol earlier this AM. OT to re-attempt at later time/date as able.   Fredirick Maudlin, OTR/L [place]

## 2020-12-14 NOTE — TOC Progression Note (Signed)
Transition of Care [facility]) - Progression Note    Patient Details  Name: Chaney Maclaren MRN: [id number] Date of Birth: 03-18-1931  Transition of [facility] CM/SW Contact  Eileen Stanford, LCSW Phone Number: 12/14/2020, 2:37 PM  Clinical Narrative:  CSW spoke with HTA and auth was sent for medical review and is still pending.    Expected Discharge Plan: [place] Barriers to Discharge: Continued Medical Work up  Expected Discharge Plan and Services Expected Discharge Plan: [place] In-house Referral: Clinical Social Work     Living arrangements for the past 2 months: Single Family Home                                       Social Determinants of Health (SDOH) Interventions    Readmission Risk Interventions Readmission Risk Prevention Plan 12/10/2020  Transportation Screening Complete  PCP or Specialist Appt within 3-5 Days Complete  HRI or [place] Complete  Social Work Consult for [place] Planning/Counseling Complete  Palliative Care Screening Not Applicable  Medication Review [employer] photographer) Complete  Some recent data might be hidden

## 2020-12-14 NOTE — Progress Notes (Signed)
Patient ID: Todd [name], male   DOB: June 02, 1931, 87 y.o.   MRN: [id number] [facility] PROGRESS NOTE  Todd [name] GHW:[id number] DOB: Apr 01, 1931 DOA: 12/07/2020 PCP: Park Liter P, DO  HPI/Subjective: Patient given Haldol last night and was sleeping this morning.  Patient is very sensitive to medications and should not be given anything for sleep or agitation.  Objective: Vitals:   12/14/20 1322 12/14/20 1614  BP:  139/75  Pulse:  61  Resp:  16  Temp:  98 F (36.7 C)  SpO2: 99% 100%    Intake/Output Summary (Last 24 hours) at 12/14/2020 1644 Last data filed at 12/14/2020 1613 Gross per 24 hour  Intake 120 ml  Output 1200 ml  Net -1080 ml   Filed Weights   12/12/20 0442 12/13/20 0549 12/14/20 0438  Weight: 83.6 kg 84.3 kg 82.9 kg    ROS: Review of Systems  Unable to perform ROS: Acuity of condition  Exam: Physical Exam HENT:     Head: Normocephalic.  Eyes:     General: Lids are normal.     Conjunctiva/sclera: Conjunctivae normal.  Cardiovascular:     Rate and Rhythm: Normal rate and regular rhythm.     Heart sounds: Normal heart sounds, S1 normal and S2 normal.  Pulmonary:     Breath sounds: Normal breath sounds. No decreased breath sounds, wheezing, rhonchi or rales.  Abdominal:     Palpations: Abdomen is soft.     Tenderness: There is no abdominal tenderness.  Musculoskeletal:     Right lower leg: No swelling.     Left lower leg: No swelling.  Skin:    General: Skin is warm.     Findings: No rash.  Neurological:     Mental Status: He is lethargic.     Data Reviewed: Basic Metabolic Panel: Recent Labs  Lab 12/07/20 1719 12/08/20 0657 12/08/20 0657 12/08/20 2307 12/09/20 0204 12/10/20 0559 12/11/20 0536 12/12/20 0629 12/13/20 0446  NA 139 141  --   --  137  --  137  --  137  K 4.1 3.8  --   --  4.6  --  3.8  --  4.0  CL 103 107  --   --  100  --  103  --  104  CO2 29 23  --   --  24  --  26  --  29  GLUCOSE 202* 184*  --   --  216*  --   158*  --  154*  BUN 24* 24*  --   --  29*  --  35*  --  23  CREATININE 1.40* 1.35*  --   --  1.70*  --  1.32*  --  1.03  CALCIUM 8.5* 8.3*  --   --  8.5*  --  8.4*  --  8.4*  MG  --  1.4*   < > 1.9 2.0 1.9 2.0 2.0  --    < > = values in this interval not displayed.   Liver Function Tests: Recent Labs  Lab 12/07/20 1719  AST 20  ALT <5  ALKPHOS 52  BILITOT 0.6  PROT 6.7  ALBUMIN 3.7   CBC: Recent Labs  Lab 12/07/20 1719 12/08/20 0657 12/11/20 0536 12/12/20 0847  WBC 7.1 7.0 7.0 6.3  NEUTROABS 4.4  --   --   --   HGB 9.9* 9.8* 8.7* 9.6*  HCT 31.3* 30.8* 27.2* 30.6*  MCV 99.1 97.2 95.8 97.8  PLT 199 183  175 191    BNP (last 3 results) Recent Labs    12/08/20 0657  BNP 699.5*     CBG: Recent Labs  Lab 12/13/20 1802 12/13/20 2107 12/14/20 0733 12/14/20 1137 12/14/20 1612  GLUCAP 198* 190* 167* 129* 125*    Recent Results (from the past 240 hour(s))  Resp Panel by RT-PCR (Flu A&B, Covid) Nasopharyngeal Swab     Status: None   Collection Time: 12/07/20  8:40 PM   Specimen: Nasopharyngeal Swab; Nasopharyngeal(NP) swabs in vial transport medium  Result Value Ref Range Status   SARS Coronavirus 2 by RT PCR NEGATIVE NEGATIVE Final    Comment: (NOTE) SARS-CoV-2 target nucleic acids are NOT DETECTED.  The SARS-CoV-2 RNA is generally detectable in upper respiratory specimens during the acute phase of infection. The lowest concentration of SARS-CoV-2 viral copies this assay can detect is 138 copies/mL. A negative result does not preclude SARS-Cov-2 infection and should not be used as the sole basis for treatment or other patient management decisions. A negative result may occur with  improper specimen collection/handling, submission of specimen other than nasopharyngeal swab, presence of viral mutation(s) within the areas targeted by this assay, and inadequate number of viral copies(<138 copies/mL). A negative result must be combined with clinical observations,  patient history, and epidemiological information. The expected result is Negative.  Fact Sheet for Patients:  EntrepreneurPulse.com.au  Fact Sheet for Healthcare Providers:  IncredibleEmployment.be  This test is no t yet approved or cleared by the [place] FDA and  has been authorized for detection and/or diagnosis of SARS-CoV-2 by FDA under an Emergency Use Authorization (EUA). This EUA will remain  in effect (meaning this test can be used) for the duration of the COVID-19 declaration under Section 564(b)(1) of the Act, 21 U.S.C.section 360bbb-3(b)(1), unless the authorization is terminated  or revoked sooner.       Influenza A by PCR NEGATIVE NEGATIVE Final   Influenza B by PCR NEGATIVE NEGATIVE Final    Comment: (NOTE) The Xpert Xpress SARS-CoV-2/FLU/RSV plus assay is intended as an aid in the diagnosis of influenza from Nasopharyngeal swab specimens and should not be used as a sole basis for treatment. Nasal washings and aspirates are unacceptable for Xpert Xpress SARS-CoV-2/FLU/RSV testing.  Fact Sheet for Patients: EntrepreneurPulse.com.au  Fact Sheet for Healthcare Providers: IncredibleEmployment.be  This test is not yet approved or cleared by the [place] FDA and has been authorized for detection and/or diagnosis of SARS-CoV-2 by FDA under an Emergency Use Authorization (EUA). This EUA will remain in effect (meaning this test can be used) for the duration of the COVID-19 declaration under Section 564(b)(1) of the Act, 21 U.S.C. section 360bbb-3(b)(1), unless the authorization is terminated or revoked.  Performed at [facility], [address] [place], [place] [address]   Culture, blood (Routine X 2) w Reflex to ID Panel     Status: None   Collection Time: 12/08/20  7:04 AM   Specimen: BLOOD  Result Value Ref Range Status   Specimen Description BLOOD LEFT ANTECUBITAL   Final   Special Requests   Final    BOTTLES DRAWN AEROBIC AND ANAEROBIC Blood Culture adequate volume   Culture   Final    NO GROWTH 5 DAYS Performed at [facility], [address] [place], [place] [address]    Report Status 12/13/2020 FINAL  Final  Culture, blood (Routine X 2) w Reflex to ID Panel     Status: None   Collection Time: 12/08/20  7:05 AM   Specimen: BLOOD  Result Value Ref Range Status   Specimen Description BLOOD BLOOD LEFT HAND  Final   Special Requests   Final    BOTTLES DRAWN AEROBIC AND ANAEROBIC Blood Culture adequate volume   Culture   Final    NO GROWTH 5 DAYS Performed at [facility], [address] [place], [place] [address]    Report Status 12/13/2020 FINAL  Final  MRSA Next Gen by PCR, Nasal     Status: None   Collection Time: 12/09/20 12:17 AM   Specimen: Nasal Mucosa; Nasal Swab  Result Value Ref Range Status   MRSA by PCR Next Gen NOT DETECTED NOT DETECTED Final    Comment: (NOTE) The GeneXpert MRSA Assay (FDA approved for NASAL specimens only), is one component of a comprehensive MRSA colonization surveillance program. It is not intended to diagnose MRSA infection nor to guide or monitor treatment for MRSA infections. Test performance is not FDA approved in patients less than 75 years old. Performed at [facility], [address] [place], [place] [address]   Resp Panel by RT-PCR (Flu A&B, Covid) Nasopharyngeal Swab     Status: None   Collection Time: 12/13/20 10:34 AM   Specimen: Nasopharyngeal Swab; Nasopharyngeal(NP) swabs in vial transport medium  Result Value Ref Range Status   SARS Coronavirus 2 by RT PCR NEGATIVE NEGATIVE Final    Comment: (NOTE) SARS-CoV-2 target nucleic acids are NOT DETECTED.  The SARS-CoV-2 RNA is generally detectable in upper respiratory specimens during the acute phase of infection. The lowest concentration of SARS-CoV-2 viral copies this assay can detect is 138 copies/mL. A  negative result does not preclude SARS-Cov-2 infection and should not be used as the sole basis for treatment or other patient management decisions. A negative result may occur with  improper specimen collection/handling, submission of specimen other than nasopharyngeal swab, presence of viral mutation(s) within the areas targeted by this assay, and inadequate number of viral copies(<138 copies/mL). A negative result must be combined with clinical observations, patient history, and epidemiological information. The expected result is Negative.  Fact Sheet for Patients:  EntrepreneurPulse.com.au  Fact Sheet for Healthcare Providers:  IncredibleEmployment.be  This test is no t yet approved or cleared by the [place] FDA and  has been authorized for detection and/or diagnosis of SARS-CoV-2 by FDA under an Emergency Use Authorization (EUA). This EUA will remain  in effect (meaning this test can be used) for the duration of the COVID-19 declaration under Section 564(b)(1) of the Act, 21 U.S.C.section 360bbb-3(b)(1), unless the authorization is terminated  or revoked sooner.       Influenza A by PCR NEGATIVE NEGATIVE Final   Influenza B by PCR NEGATIVE NEGATIVE Final    Comment: (NOTE) The Xpert Xpress SARS-CoV-2/FLU/RSV plus assay is intended as an aid in the diagnosis of influenza from Nasopharyngeal swab specimens and should not be used as a sole basis for treatment. Nasal washings and aspirates are unacceptable for Xpert Xpress SARS-CoV-2/FLU/RSV testing.  Fact Sheet for Patients: EntrepreneurPulse.com.au  Fact Sheet for Healthcare Providers: IncredibleEmployment.be  This test is not yet approved or cleared by the [place] FDA and has been authorized for detection and/or diagnosis of SARS-CoV-2 by FDA under an Emergency Use Authorization (EUA). This EUA will remain in effect (meaning this test can  be used) for the duration of the COVID-19 declaration under Section 564(b)(1) of the Act, 21 U.S.C. section 360bbb-3(b)(1), unless the authorization is terminated or revoked.  Performed at [facility], [address] [place], [place] [address]        Scheduled Meds:  amiodarone  400 mg Oral BID   apixaban  5 mg Oral BID   budesonide (PULMICORT) nebulizer solution  0.5 mg Nebulization BID   carbidopa-levodopa  1 tablet Oral TID AC   DULoxetine  30 mg Oral Daily   insulin aspart  0-9 Units Subcutaneous TID WC   ipratropium-albuterol  3 mL Nebulization TID   isosorbide-hydrALAZINE  0.5 tablet Oral TID   latanoprost  1 drop Both Eyes QHS   magnesium oxide  400 mg Oral BID   melatonin  2.5 mg Oral QHS   metoprolol tartrate  25 mg Oral BID   polyethylene glycol  17 g Oral Daily   sodium chloride flush  3 mL Intravenous Q12H   Continuous Infusions:  sodium chloride Stopped (12/09/20 1234)   Brief history.  Patient was admitted 12/07/2020 with generalized weakness and atrial fibrillation.  The patient was cardioverted on 12/10/2020 into normal sinus rhythm by cardiology.  The patient was placed on antibiotics for right lower lobe pneumonia on Rocephin and doxycycline and finish the course.  The patient has had intermittent agitation where he is being given meds and then has been lethargic after medications.  The patient's mental status was better yesterday but the patient was given Haldol last night and mental status today is impaired.  Awaiting insurance authorization to go out to rehab.  Past medical history stroke with chronic right-sided weakness, prostate cancer, hypertension, hyperlipidemia, type 2 diabetes mellitus, depression, Parkinson's.  Assessment/Plan:  Acute metabolic encephalopathy and lethargy.  Was given Haldol last night (by night coverage).  Also was lethargic after Seroquel and trazodone.  Do not give any sedating medications to this patient at night.  Melatonin  only. Paroxysmal atrial fibrillation.  Cardioverted on 12/10/2020 and in normal sinus rhythm on oral amiodarone and Eliquis. Right lower lobe pneumonia completed 5 days of Rocephin and doxycycline.  Repeat chest x-ray negative. Acute kidney injury on chronic kidney disease stage II.  Creatinine peaked at 1.7 and came down to 1.03.  The patient was given a dose of Lasix and creatinine worsened to 1.7. Prior history of ischemic stroke with baseline right-sided weakness Hypomagnesemia.  Replaced during the hospital course Type 2 diabetes mellitus with hyperlipidemia.  Restart Lipitor.  Holding oral diabetic medications.  On sliding scale insulin for now. Anemia of chronic disease.  Last hemoglobin 9.6. Constipation on MiraLAX and as needed Dulcolax pills Anxiety depression on Cymbalta Lactic acidosis on Glucophage Weakness.  Physical therapy recommending rehab.  Awaiting insurance authorization      Code Status:     Code Status Orders  (From admission, onward)           Start     Ordered   12/07/20 2334  Full code  Continuous        12/07/20 2335           Code Status History     Date Active Date Inactive Code Status Order ID Comments User Context   11/11/2018 1817 11/17/2018 1950 Full Code [id number]  Gorden Harms, MD Inpatient   04/20/2018 2122 04/23/2018 1849 Full Code [id number]  Gladstone Lighter, MD Inpatient   05/18/2017 0303 05/21/2017 1928 Full Code [id number]  Lance Coon, MD Inpatient   03/17/2017 0419 03/17/2017 2109 Full Code [id number]  Saundra Shelling, MD ED   03/05/2015 0019 03/05/2015 1507 Full Code [id number]  Lance Coon, MD  Inpatient      Family Communication: Spoke with daughter greater on the phone Disposition Plan: Status is: Inpatient Dispo: The patient is from: Home              Anticipated d/c is to: Rehab              Patient currently lethargic today after receiving Haldol last night   Difficult to place patient.  Awaiting insurance  authorization to go out to rehab  Consultants: Cardiology  Time spent: 27 minutes  [place]

## 2020-12-14 NOTE — Progress Notes (Signed)
Progress Note  Patient Name: Todd [name] Date of Encounter: 12/14/2020  [facility] Cardiologist: Ida Rogue, MD   Subjective   Lethargic, unable to communicate much on rounds this morning Notes indicating he has removed his catheterization multiple times, attempting to get out of bed Was given Haldol 1 mg with relief  Inpatient Medications    Scheduled Meds:  amiodarone  400 mg Oral BID   apixaban  5 mg Oral BID   budesonide (PULMICORT) nebulizer solution  0.5 mg Nebulization BID   carbidopa-levodopa  1 tablet Oral TID AC   DULoxetine  30 mg Oral Daily   insulin aspart  0-9 Units Subcutaneous TID WC   ipratropium-albuterol  3 mL Nebulization TID   isosorbide-hydrALAZINE  0.5 tablet Oral TID   latanoprost  1 drop Both Eyes QHS   magnesium oxide  400 mg Oral BID   melatonin  2.5 mg Oral QHS   metoprolol tartrate  25 mg Oral BID   polyethylene glycol  17 g Oral Daily   sodium chloride flush  3 mL Intravenous Q12H   Continuous Infusions:  sodium chloride Stopped (12/09/20 1234)   PRN Meds: acetaminophen **OR** acetaminophen, bisacodyl, lactulose, ondansetron **OR** ondansetron (ZOFRAN) IV   Vital Signs    Vitals:   12/14/20 0438 12/14/20 0713 12/14/20 0733 12/14/20 1136  BP: (!) 143/61  (!) 153/74 126/65  Pulse: 62  65 68  Resp: 15  16 16   Temp: 98.3 F (36.8 C)  98.3 F (36.8 C) 98.5 F (36.9 C)  TempSrc:    Axillary  SpO2: 99% 98% 100% 97%  Weight: 82.9 kg     Height:        Intake/Output Summary (Last 24 hours) at 12/14/2020 1145 Last data filed at 12/14/2020 1139 Gross per 24 hour  Intake 0 ml  Output 1100 ml  Net -1100 ml   Last 3 Weights 12/14/2020 12/13/2020 12/12/2020  Weight (lbs) 182 lb 12.2 oz 185 lb 14.4 oz 184 lb 4.9 oz  Weight (kg) 82.9 kg 84.324 kg 83.6 kg      Telemetry    Normal sinus rhythm- Personally Reviewed  ECG    - Personally Reviewed  Physical Exam   GEN: Lethargic, not communicative Neck: No JVD Cardiac: RRR, no  murmurs, rubs, or gallops.  Respiratory: Clear to auscultation bilaterally. GI: Soft, non-distended  MS: No edema; No deformity. Neuro: Unable to test Psych: Unable to test  Labs    High Sensitivity Troponin:   Recent Labs  Lab 12/07/20 1719 12/07/20 1955 12/08/20 2307 12/09/20 0204  TROPONINIHS 95* 96* 127* 149*      Chemistry Recent Labs  Lab 12/07/20 1719 12/08/20 0657 12/09/20 0204 12/11/20 0536 12/13/20 0446  NA 139   < > 137 137 137  K 4.1   < > 4.6 3.8 4.0  CL 103   < > 100 103 104  CO2 29   < > 24 26 29   GLUCOSE 202*   < > 216* 158* 154*  BUN 24*   < > 29* 35* 23  CREATININE 1.40*   < > 1.70* 1.32* 1.03  CALCIUM 8.5*   < > 8.5* 8.4* 8.4*  PROT 6.7  --   --   --   --   ALBUMIN 3.7  --   --   --   --   AST 20  --   --   --   --   ALT <5  --   --   --   --  ALKPHOS 52  --   --   --   --   BILITOT 0.6  --   --   --   --   GFRNONAA 48*   < > 38* 52* >60  ANIONGAP 7   < > 13 8 4*   < > = values in this interval not displayed.     Hematology Recent Labs  Lab 12/08/20 0657 12/11/20 0536 12/12/20 0847  WBC 7.0 7.0 6.3  RBC 3.17* 2.84* 3.13*  HGB 9.8* 8.7* 9.6*  HCT 30.8* 27.2* 30.6*  MCV 97.2 95.8 97.8  MCH 30.9 30.6 30.7  MCHC 31.8 32.0 31.4  RDW 12.8 12.9 12.8  PLT 183 175 191    BNP Recent Labs  Lab 12/08/20 0657  BNP 699.5*     DDimer No results for input(s): DDIMER in the last 168 hours.   Radiology    DG Chest Port 1 View  Result Date: 12/12/2020 CLINICAL DATA:  Wheezing. EXAM: PORTABLE CHEST 1 VIEW COMPARISON:  12/08/2020 FINDINGS: Stable enlarged cardiac silhouette and elevation of the right hemidiaphragm. Stable linear scarring or atelectasis in the right mid lung zone. Thoracic spine and bilateral shoulder degenerative changes. IMPRESSION: No acute abnormality. Electronically Signed   By: Claudie Revering M.D.   On: 12/12/2020 16:25    Cardiac Studies   87 y.o. 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   Patient Profile     Afib/flutter DCCV to SR 7/8, remains in NSR We will continue amiodarone 500 twice daily4 more days then 200 twice daily until follow-up in clinic Continue Eliquis 5mg  BID,  metoprolol 25mg  BID   Acute on chronic respiratory failure with hypoxia/systolic dysfunction - Echo :mild systolic dysfunction with EF 50%, in the setting of tachy-arrhythmia. Last echo 11/2019 showed LVEF 50-55% - continue metoprolol, BiDil - No ACE/ARB/ARNI/SGLT2i with AKI  Would restart home Lasix only if his mentation improves and fluid intake picks up Diuretic previously held in the setting of renal dysfunction, AKI on arrival  Elevated troponin CAD troponin  149, demand ischemia in the setting of rapid afib, AMS, PNA, AKI, anemia, electrolyte disorder - Normal MPI in 2018 - Echo as above - No ASA with chronic anticoagulation No plan for ischemic work-up at this time   AKI - diuretics held - improved   Anemia Last hemoglobin 9.62 days ago   Total encounter time more than 25 minutes  Greater than 50% was spent in counseling and coordination of care with the patient   For questions or updates, please contact [facility] Please consult [email or web address] for contact info under        Signed, Ida Rogue, MD  12/14/2020, 11:45 AM

## 2020-12-14 NOTE — Plan of Care (Signed)

## 2020-12-15 LAB — CBC
HCT: 29.4 % — ABNORMAL LOW (ref 39.0–52.0)
Hemoglobin: 9.1 g/dL — ABNORMAL LOW (ref 13.0–17.0)
MCH: 30.2 pg (ref 26.0–34.0)
MCHC: 31 g/dL (ref 30.0–36.0)
MCV: 97.7 fL (ref 80.0–100.0)
Platelets: 239 10*3/uL (ref 150–400)
RBC: 3.01 MIL/uL — ABNORMAL LOW (ref 4.22–5.81)
RDW: 12.8 % (ref 11.5–15.5)
WBC: 6.4 10*3/uL (ref 4.0–10.5)
nRBC: 0 % (ref 0.0–0.2)

## 2020-12-15 LAB — BASIC METABOLIC PANEL
Anion gap: 7 (ref 5–15)
BUN: 17 mg/dL (ref 8–23)
CO2: 30 mmol/L (ref 22–32)
Calcium: 9.2 mg/dL (ref 8.9–10.3)
Chloride: 103 mmol/L (ref 98–111)
Creatinine, Ser: 1.1 mg/dL (ref 0.61–1.24)
GFR, Estimated: >60 mL/min (ref >60)
Glucose, Bld: 156 mg/dL — ABNORMAL HIGH (ref 70–99)
Potassium: 5.1 mmol/L (ref 3.5–5.1)
Sodium: 140 mmol/L (ref 135–145)

## 2020-12-15 LAB — GLUCOSE, CAPILLARY
Glucose-Capillary: 153 mg/dL — ABNORMAL HIGH (ref 70–99)
Glucose-Capillary: 219 mg/dL — ABNORMAL HIGH (ref 70–99)
Glucose-Capillary: 158 mg/dL — ABNORMAL HIGH (ref 70–99)
Glucose-Capillary: 191 mg/dL — ABNORMAL HIGH (ref 70–99)

## 2020-12-15 MED ORDER — ISOSORB DINITRATE-HYDRALAZINE 20-37.5 MG PO TABS
1.0000 | ORAL_TABLET | Freq: Three times a day (TID) | ORAL | Status: DC
  Administered 2020-12-15 – 2020-12-16 (×4): 1 via ORAL
  Filled 2020-12-15 (×7): qty 1

## 2020-12-15 MED ORDER — METOPROLOL SUCCINATE ER 25 MG PO TB24
25.0000 mg | ORAL_TABLET | Freq: Every day | ORAL | Status: DC
  Administered 2020-12-16: 25 mg via ORAL
  Filled 2020-12-15: qty 1

## 2020-12-15 MED ORDER — AMIODARONE HCL 200 MG PO TABS
200.0000 mg | ORAL_TABLET | Freq: Every day | ORAL | Status: DC
  Administered 2020-12-16: 200 mg via ORAL
  Filled 2020-12-15: qty 1

## 2020-12-15 NOTE — Progress Notes (Signed)
Picked pt up from [place], [place] around 2 am, no complaints of pain or discomfort since then, slept well. External cath in place, 4L [place], NSR on the monitor. Waiting on bed placement

## 2020-12-15 NOTE — Progress Notes (Signed)
Progress Note  Patient Name: Todd [name] Date of Encounter: 12/15/2020  [facility] Cardiologist: Ida Rogue, MD   Subjective   Family member at bedside, patient is eating breakfast during interview. Felt mentation is unchanged. Patient denies chest pain. He remains in SR.   Inpatient Medications    Scheduled Meds:  amiodarone  400 mg Oral BID   apixaban  5 mg Oral BID   atorvastatin  20 mg Oral q1800   budesonide (PULMICORT) nebulizer solution  0.5 mg Nebulization BID   carbidopa-levodopa  1 tablet Oral TID AC   DULoxetine  30 mg Oral Daily   insulin aspart  0-9 Units Subcutaneous TID WC   ipratropium-albuterol  3 mL Nebulization TID   isosorbide-hydrALAZINE  0.5 tablet Oral TID   latanoprost  1 drop Both Eyes QHS   magnesium oxide  400 mg Oral BID   melatonin  2.5 mg Oral QHS   metoprolol tartrate  25 mg Oral BID   polyethylene glycol  17 g Oral Daily   sodium chloride flush  3 mL Intravenous Q12H   Continuous Infusions:  sodium chloride Stopped (12/09/20 1234)   PRN Meds: acetaminophen **OR** acetaminophen, bisacodyl, lactulose, ondansetron **OR** ondansetron (ZOFRAN) IV   Vital Signs    Vitals:   12/15/20 0404 12/15/20 0429 12/15/20 0739 12/15/20 0753  BP: 128/72   (!) 161/86  Pulse: 65   66  Resp: 20   17  Temp: 97.7 F (36.5 C)   (!) 97.4 F (36.3 C)  TempSrc:    Oral  SpO2: 100%  96% 100%  Weight:  83.6 kg    Height:        Intake/Output Summary (Last 24 hours) at 12/15/2020 0816 Last data filed at 12/15/2020 0700 Gross per 24 hour  Intake 480 ml  Output 2200 ml  Net -1720 ml   Last 3 Weights 12/15/2020 12/14/2020 12/13/2020  Weight (lbs) 184 lb 4.9 oz 182 lb 12.2 oz 185 lb 14.4 oz  Weight (kg) 83.6 kg 82.9 kg 84.324 kg      Telemetry    NSR, HR 60s, intermittent first degree AVB - Personally Reviewed  ECG    No new - Personally Reviewed  Physical Exam   GEN: No acute distress.   Neck: No JVD Cardiac: RRR, no murmurs, rubs, or  gallops.  Respiratory: Clear to auscultation bilaterally. GI: Soft, nontender, non-distended  MS: No edema; No deformity. Neuro:  AMS Psych: Normal affect   Labs    High Sensitivity Troponin:   Recent Labs  Lab 12/07/20 1719 12/07/20 1955 12/08/20 2307 12/09/20 0204  TROPONINIHS 95* 96* 127* 149*      Chemistry Recent Labs  Lab 12/11/20 0536 12/13/20 0446 12/15/20 0536  NA 137 137 140  K 3.8 4.0 5.1  CL 103 104 103  CO2 26 29 30   GLUCOSE 158* 154* 156*  BUN 35* 23 17  CREATININE 1.32* 1.03 1.10  CALCIUM 8.4* 8.4* 9.2  GFRNONAA 52* >60 >60  ANIONGAP 8 4* 7     Hematology Recent Labs  Lab 12/11/20 0536 12/12/20 0847 12/15/20 0536  WBC 7.0 6.3 6.4  RBC 2.84* 3.13* 3.01*  HGB 8.7* 9.6* 9.1*  HCT 27.2* 30.6* 29.4*  MCV 95.8 97.8 97.7  MCH 30.6 30.7 30.2  MCHC 32.0 31.4 31.0  RDW 12.9 12.8 12.8  PLT 175 191 239    BNPNo results for input(s): BNP, PROBNP in the last 168 hours.   DDimer No results for input(s): DDIMER  in the last 168 hours.   Radiology    No results found.  Cardiac Studies     TTE (11/18/2019): 1. Left ventricular ejection fraction, by estimation, is 50 to 55%. The  left ventricle has low normal function. The left ventricle has no regional  wall motion abnormalities. There is moderate left ventricular hypertrophy.  Left ventricular diastolic  parameters are indeterminate.   2. Right ventricular systolic function is normal. The right ventricular  size is normal. There is normal pulmonary artery systolic pressure.   3. Left atrial size was moderately dilated.   4. Mild mitral valve regurgitation.   5. There is dilatation of the ascending aorta measuring 39 mm.  __________   2D echo 12/11/2020: 1. Left ventricular ejection fraction, by estimation, is 45 to 50%. The  left ventricle has mildly decreased function. The left ventricle  demonstrates global hypokinesis. The left ventricular internal cavity size  was mildly dilated. There  is moderate  left ventricular hypertrophy. Left ventricular diastolic parameters are  consistent with Grade II diastolic dysfunction (pseudonormalization).  Elevated left ventricular end-diastolic pressure.   2. Right ventricular systolic function is mildly reduced. The right  ventricular size is mildly enlarged.   3. Left atrial size was mildly dilated.   4. Right atrial size was mildly dilated.   5. The mitral valve is abnormal. Mild mitral valve regurgitation. No  evidence of mitral stenosis. Moderate mitral annular calcification.   6. The aortic valve is tricuspid. There is moderate calcification of the  aortic valve. Aortic valve regurgitation is not visualized. Mild to  moderate aortic valve stenosis.   7. Aortic dilatation noted. There is mild dilatation of the ascending  aorta, measuring 38 mm.   8. The inferior vena cava is normal in size with <50% respiratory  variability, suggesting right atrial pressure of 8 mmHg.  Patient Profile     87 y.o. 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   Assessment & Plan    Afib/flutter - he is s/p DCCV to SR 7/8, he remains in NSR - continue amiodarone load 400mg  BID x 1 week, 200mg  BID x 1 week, followed by 200mg  daily -stroke prophylaxis with Eliquis 5mg  BID - rate control with metoprolol 25mg  BID   Acute on chronic respiratory failure with hypoxia/systolic dysfunction - Echo this admission showed mild systolic dysfunction with EF 50%, in the setting of tachy-arrhythmia. Last echo 11/2019 showed LVEF 50-55% - continue metoprolol - No ACE/ARB/ARNI/SGLT2i with AKI on presentation, can consider as OP - Bidil added - Euvolemic on exam, consider restart home lasix   Elevated troponin CAD - HS troponin  elevated to 149, suspected demand ischemia in the setting of rapid afib, AMS, PNA, AKI, anemia, electrolyte disorder - EKG without ischemic changes - Normal MPI in 2018 - Echo as above - continue statin - No ASA with chronic anticoagulation   AKI - diuretics held on admission - kidney function improved   Anemia - Hgb stable this am at 9.1  Disposition - awaiting insurance authorization for d/c to rehab   For questions or updates, please contact [facility] Please consult [email or web address] for contact info under        Signed, Anusha Claus Ninfa Meeker, PA-C  12/15/2020, 8:16 AM

## 2020-12-15 NOTE — Progress Notes (Signed)
PROGRESS NOTE    Todd [name]  KAJ:[id number] DOB: [birth year] DOA: 12/07/2020 PCP: Valerie Roys, DO    Brief Narrative:   Patient was admitted 12/07/2020 with generalized weakness and atrial fibrillation.  The patient was cardioverted on 12/10/2020 into normal sinus rhythm by cardiology.  The patient was placed on antibiotics for right lower lobe pneumonia on Rocephin and doxycycline and finish the course.  The patient has had intermittent agitation where he is being given meds and then has been lethargic after medications.  The patient's mental status was better yesterday but the patient was given Haldol last night and mental status today is impaired.  Awaiting insurance authorization to go out to rehab.  Past medical history stroke with chronic right-sided weakness, prostate cancer, hypertension, hyperlipidemia, type 2 diabetes mellitus, depression, Parkinson's.   Assessment & Plan:   Principal Problem:   Paroxysmal atrial fibrillation with rapid ventricular response (HCC) Active Problems:   Hypertension   Type 2 diabetes mellitus with hyperlipidemia (HCC)   Anxiety and depression   Lethargy   CAD (coronary artery disease)   History of stroke with residual deficit   Mixed hyperlipidemia   Weakness   Depression, major, single episode, severe (HCC)   Normocytic anemia   Anemia due to stage 3 chronic kidney disease (HCC)   Atrial fibrillation and flutter (HCC)   Hypomagnesemia   Ischemic stroke ([place])   Pneumonia of right lower lobe due to infectious organism   Acute kidney injury superimposed on CKD ([place])   Acute metabolic encephalopathy   Wheeze   Anemia of chronic disease  #1.  Acute metabolic encephalopathy. Failure to thrive. Generalized weakness Met patient daughter-in-law, patient condition has been deteriorating since January.  He sleeps around 18 hours a day. His condition probably terminal, I will consult palliative care.  2.  Paroxysmal atrial fibrillation.  S/p  cardioversion on 7/8. Currently still in sinus rhythm, continue amiodarone and Eliquis.  3.  Right lower lobe pneumonia. Completed antibiotics per  4.  acute kidney injury. Hypomagnesemia. Renal function has improved.  #5.  Right-sided weakness with history of ischemic stroke. Continue home medicines.  6.  Type 2 diabetes with dyslipidemia. Continue sliding scale insulin.  7.  Anemia of chronic disease. Hemoglobin stable.      DVT prophylaxis: Eliquis Code Status: full Family Communication: Daughter-in-law at the bedside. Disposition Plan:    Status is: Inpatient  Remains inpatient appropriate because:Unsafe d/c plan  Dispo: The patient is from: Home              Anticipated d/c is to: SNF              Patient currently is medically stable to d/c.   Difficult to place patient No        I/O last 3 completed shifts: In: 480 [P.O.:480] Out: 2200 [Urine:2200] No intake/output data recorded.     Consultants:  Palliative care  Procedures: None  Antimicrobials: None  Subjective: Patient has some confusion, otherwise no complaints. Denies any short of breath or cough. No abdominal pain or nausea vomiting. No dysuria hematuria. No chest pain palpitation. No fever or chills.  Objective: Vitals:   12/15/20 0739 12/15/20 0753 12/15/20 1144 12/15/20 1319  BP:  (!) 161/86 128/71   Pulse:  66 68   Resp:  17 17   Temp:  (!) 97.4 F (36.3 C) 98 F (36.7 C)   TempSrc:  Oral    SpO2: 96% 100% 100% 98%  Weight:  Height:        Intake/Output Summary (Last 24 hours) at 12/15/2020 1507 Last data filed at 12/15/2020 0700 Gross per 24 hour  Intake 480 ml  Output 1400 ml  Net -920 ml   Filed Weights   12/13/20 0549 12/14/20 0438 12/15/20 0429  Weight: 84.3 kg 82.9 kg 83.6 kg    Examination:  General exam: Appears calm and comfortable  Respiratory system: Clear to auscultation. Respiratory effort normal. Cardiovascular system: S1 & S2 heard, RRR.  No JVD, murmurs, rubs, gallops or clicks. No pedal edema. Gastrointestinal system: Abdomen is nondistended, soft and nontender. No organomegaly or masses felt. Normal bowel sounds heard. Central nervous system: Alert and oriented x1. No focal neurological deficits. Extremities: Symmetric 5 x 5 power. Skin: No rashes, lesions or ulcers Psychiatry: Mood & affect appropriate.     Data Reviewed: I have personally reviewed following labs and imaging studies  CBC: Recent Labs  Lab 12/11/20 0536 12/12/20 0847 12/15/20 0536  WBC 7.0 6.3 6.4  HGB 8.7* 9.6* 9.1*  HCT 27.2* 30.6* 29.4*  MCV 95.8 97.8 97.7  PLT 175 191 203   Basic Metabolic Panel: Recent Labs  Lab 12/08/20 2307 12/09/20 0204 12/10/20 0559 12/11/20 0536 12/12/20 0629 12/13/20 0446 12/15/20 0536  NA  --  137  --  137  --  137 140  K  --  4.6  --  3.8  --  4.0 5.1  CL  --  100  --  103  --  104 103  CO2  --  24  --  26  --  29 30  GLUCOSE  --  216*  --  158*  --  154* 156*  BUN  --  29*  --  35*  --  23 17  CREATININE  --  1.70*  --  1.32*  --  1.03 1.10  CALCIUM  --  8.5*  --  8.4*  --  8.4* 9.2  MG 1.9 2.0 1.9 2.0 2.0  --   --    GFR: Estimated Creatinine Clearance: 48.9 mL/min (by C-G formula based on SCr of 1.1 mg/dL). Liver Function Tests: No results for input(s): AST, ALT, ALKPHOS, BILITOT, PROT, ALBUMIN in the last 168 hours. No results for input(s): LIPASE, AMYLASE in the last 168 hours. No results for input(s): AMMONIA in the last 168 hours. Coagulation Profile: Recent Labs  Lab 12/10/20 0917  INR 1.4*   Cardiac Enzymes: No results for input(s): CKTOTAL, CKMB, CKMBINDEX, TROPONINI in the last 168 hours. BNP (last 3 results) No results for input(s): PROBNP in the last 8760 hours. HbA1C: No results for input(s): HGBA1C in the last 72 hours. CBG: Recent Labs  Lab 12/14/20 1137 12/14/20 1612 12/14/20 2133 12/15/20 0754 12/15/20 1145  GLUCAP 129* 125* 261* 153* 219*   Lipid Profile: No  results for input(s): CHOL, HDL, LDLCALC, TRIG, CHOLHDL, LDLDIRECT in the last 72 hours. Thyroid Function Tests: No results for input(s): TSH, T4TOTAL, FREET4, T3FREE, THYROIDAB in the last 72 hours. Anemia Panel: No results for input(s): VITAMINB12, FOLATE, FERRITIN, TIBC, IRON, RETICCTPCT in the last 72 hours. Sepsis Labs: No results for input(s): PROCALCITON, LATICACIDVEN in the last 168 hours.  Recent Results (from the past 240 hour(s))  Resp Panel by RT-PCR (Flu A&B, Covid) Nasopharyngeal Swab     Status: None   Collection Time: 12/07/20  8:40 PM   Specimen: Nasopharyngeal Swab; Nasopharyngeal(NP) swabs in vial transport medium  Result Value Ref Range Status   SARS Coronavirus  2 by RT PCR NEGATIVE NEGATIVE Final    Comment: (NOTE) SARS-CoV-2 target nucleic acids are NOT DETECTED.  The SARS-CoV-2 RNA is generally detectable in upper respiratory specimens during the acute phase of infection. The lowest concentration of SARS-CoV-2 viral copies this assay can detect is 138 copies/mL. A negative result does not preclude SARS-Cov-2 infection and should not be used as the sole basis for treatment or other patient management decisions. A negative result may occur with  improper specimen collection/handling, submission of specimen other than nasopharyngeal swab, presence of viral mutation(s) within the areas targeted by this assay, and inadequate number of viral copies(<138 copies/mL). A negative result must be combined with clinical observations, patient history, and epidemiological information. The expected result is Negative.  Fact Sheet for Patients:  EntrepreneurPulse.com.au  Fact Sheet for Healthcare Providers:  IncredibleEmployment.be  This test is no t yet approved or cleared by the [place] FDA and  has been authorized for detection and/or diagnosis of SARS-CoV-2 by FDA under an Emergency Use Authorization (EUA). This EUA will remain   in effect (meaning this test can be used) for the duration of the COVID-19 declaration under Section 564(b)(1) of the Act, 21 U.S.C.section 360bbb-3(b)(1), unless the authorization is terminated  or revoked sooner.       Influenza A by PCR NEGATIVE NEGATIVE Final   Influenza B by PCR NEGATIVE NEGATIVE Final    Comment: (NOTE) The Xpert Xpress SARS-CoV-2/FLU/RSV plus assay is intended as an aid in the diagnosis of influenza from Nasopharyngeal swab specimens and should not be used as a sole basis for treatment. Nasal washings and aspirates are unacceptable for Xpert Xpress SARS-CoV-2/FLU/RSV testing.  Fact Sheet for Patients: EntrepreneurPulse.com.au  Fact Sheet for Healthcare Providers: IncredibleEmployment.be  This test is not yet approved or cleared by the [place] FDA and has been authorized for detection and/or diagnosis of SARS-CoV-2 by FDA under an Emergency Use Authorization (EUA). This EUA will remain in effect (meaning this test can be used) for the duration of the COVID-19 declaration under Section 564(b)(1) of the Act, 21 U.S.C. section 360bbb-3(b)(1), unless the authorization is terminated or revoked.  Performed at [facility], [address] [place], [place] [address]   Culture, blood (Routine X 2) w Reflex to ID Panel     Status: None   Collection Time: 12/08/20  7:04 AM   Specimen: BLOOD  Result Value Ref Range Status   Specimen Description BLOOD LEFT ANTECUBITAL  Final   Special Requests   Final    BOTTLES DRAWN AEROBIC AND ANAEROBIC Blood Culture adequate volume   Culture   Final    NO GROWTH 5 DAYS Performed at [facility], [address] [place], [place] [address]    Report Status 12/13/2020 FINAL  Final  Culture, blood (Routine X 2) w Reflex to ID Panel     Status: None   Collection Time: 12/08/20  7:05 AM   Specimen: BLOOD  Result Value Ref Range Status   Specimen Description BLOOD  BLOOD LEFT HAND  Final   Special Requests   Final    BOTTLES DRAWN AEROBIC AND ANAEROBIC Blood Culture adequate volume   Culture   Final    NO GROWTH 5 DAYS Performed at [facility], [address], [place],  [address]    Report Status 12/13/2020 FINAL  Final  MRSA Next Gen by PCR, Nasal     Status: None   Collection Time: 12/09/20 12:17 AM   Specimen: Nasal  Mucosa; Nasal Swab  Result Value Ref Range Status   MRSA by PCR Next Gen NOT DETECTED NOT DETECTED Final    Comment: (NOTE) The GeneXpert MRSA Assay (FDA approved for NASAL specimens only), is one component of a comprehensive MRSA colonization surveillance program. It is not intended to diagnose MRSA infection nor to guide or monitor treatment for MRSA infections. Test performance is not FDA approved in patients less than 67 years old. Performed at [facility], [address] [place], [place] [address]   Resp Panel by RT-PCR (Flu A&B, Covid) Nasopharyngeal Swab     Status: None   Collection Time: 12/13/20 10:34 AM   Specimen: Nasopharyngeal Swab; Nasopharyngeal(NP) swabs in vial transport medium  Result Value Ref Range Status   SARS Coronavirus 2 by RT PCR NEGATIVE NEGATIVE Final    Comment: (NOTE) SARS-CoV-2 target nucleic acids are NOT DETECTED.  The SARS-CoV-2 RNA is generally detectable in upper respiratory specimens during the acute phase of infection. The lowest concentration of SARS-CoV-2 viral copies this assay can detect is 138 copies/mL. A negative result does not preclude SARS-Cov-2 infection and should not be used as the sole basis for treatment or other patient management decisions. A negative result may occur with  improper specimen collection/handling, submission of specimen other than nasopharyngeal swab, presence of viral mutation(s) within the areas targeted by this assay, and inadequate number of viral copies(<138 copies/mL). A negative result must be combined with clinical  observations, patient history, and epidemiological information. The expected result is Negative.  Fact Sheet for Patients:  EntrepreneurPulse.com.au  Fact Sheet for Healthcare Providers:  IncredibleEmployment.be  This test is no t yet approved or cleared by the [place] FDA and  has been authorized for detection and/or diagnosis of SARS-CoV-2 by FDA under an Emergency Use Authorization (EUA). This EUA will remain  in effect (meaning this test can be used) for the duration of the COVID-19 declaration under Section 564(b)(1) of the Act, 21 U.S.C.section 360bbb-3(b)(1), unless the authorization is terminated  or revoked sooner.       Influenza A by PCR NEGATIVE NEGATIVE Final   Influenza B by PCR NEGATIVE NEGATIVE Final    Comment: (NOTE) The Xpert Xpress SARS-CoV-2/FLU/RSV plus assay is intended as an aid in the diagnosis of influenza from Nasopharyngeal swab specimens and should not be used as a sole basis for treatment. Nasal washings and aspirates are unacceptable for Xpert Xpress SARS-CoV-2/FLU/RSV testing.  Fact Sheet for Patients: EntrepreneurPulse.com.au  Fact Sheet for Healthcare Providers: IncredibleEmployment.be  This test is not yet approved or cleared by the [place] FDA and has been authorized for detection and/or diagnosis of SARS-CoV-2 by FDA under an Emergency Use Authorization (EUA). This EUA will remain in effect (meaning this test can be used) for the duration of the COVID-19 declaration under Section 564(b)(1) of the Act, 21 U.S.C. section 360bbb-3(b)(1), unless the authorization is terminated or revoked.  Performed at [facility], [address], [place],  [address]          Radiology Studies: No results found.      Scheduled Meds:  [START ON 12/16/2020] amiodarone  200 mg Oral Daily   amiodarone  400 mg Oral BID   apixaban  5 mg Oral BID    atorvastatin  20 mg Oral q1800   budesonide (PULMICORT) nebulizer solution  0.5 mg Nebulization BID   carbidopa-levodopa  1 tablet Oral TID AC   DULoxetine  30 mg Oral Daily   insulin aspart  0-9 Units Subcutaneous TID WC   ipratropium-albuterol  3 mL Nebulization TID   isosorbide-hydrALAZINE  1 tablet Oral TID   latanoprost  1 drop Both Eyes QHS   magnesium oxide  400 mg Oral BID   melatonin  2.5 mg Oral QHS   [START ON 12/16/2020] metoprolol succinate  25 mg Oral Daily   polyethylene glycol  17 g Oral Daily   sodium chloride flush  3 mL Intravenous Q12H   Continuous Infusions:  sodium chloride Stopped (12/09/20 1234)     LOS: 7 days    Time spent: 28 minutes    Sharen Hones, MD [facility]   To contact the attending provider between 7A-7P or the covering provider during after hours 7P-7A, please log into the web site [email or web address] and access using universal [place] password for that web site. If you do not have the password, please call the hospital operator.  12/15/2020, 3:07 PM

## 2020-12-15 NOTE — Progress Notes (Signed)
Occupational Therapy Treatment Patient Details Name: Junah Yam MRN: [id number] DOB: [birth year] Today's Date: 12/15/2020    History of present illness Britten Seyfried is an 83yoM who comes to [facility] on 09/07/20 c fatigue, weakness, anorrexia. 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 PMH: PAF on eliquis and digoxin, CAD, CVA c Rt hemiplegia, CKD3a, DM2, HTN, HLD, chronic anemia. Pt adm for Afib with RVR. T/f to SDU on 12/08/20 for closer monitoring d/t increasd HR and increased O2 needs. Pt now s/p cardioversion and is in NSR.   OT comments  Pt seen for OT treatment on this date. Prior to arrival to room, RN informing OT that family was requesting adaptive utensils. Upon arrival to room, pt seated in recliner with daughter-in-law present. Per daughter-in-law, pt uses built-up angled utensils to feed self at baseline. This date, pt and caregiver provided with foam grip tubing for utensils; pt required MIN A to hold cup while using built-up spoon to bring ice to mouth, with pt able to perform 4x without spillage. While seated in recliner, pt performed oral care via oral swab with SET-UP-SUPERVISION. Pt also performed x3 sit<>stand transfers from recliner, requiring MOD-MAX A, bilateral UE from RW, and verbal cues for body positioning. Pt was able to maintain standing balance with RW for 10-20sec this date. Pt is making good progress toward goals and continues to benefit from skilled OT services to maximize return to PLOF and minimize risk of future falls, injury, caregiver burden, and readmission. Will continue to follow POC. Discharge recommendation remains appropriate.     Follow Up Recommendations  SNF    Equipment Recommendations  3 in 1 bedside commode;Tub/shower seat       Precautions / Restrictions Precautions Precautions: Fall Precaution Comments: expressive aphasia, mild impulsivity. Restrictions Weight Bearing Restrictions: No       Mobility Bed Mobility                General bed mobility comments: not assessed, pt in recliner at beginning and end of session    Transfers Overall transfer level: Needs assistance Equipment used: Rolling walker (2 wheeled) Transfers: Sit to/from Stand Sit to Stand: Mod assist;Max assist         General transfer comment: 3x STS from recliner, requiring MOD-MAX A.    Balance Overall balance assessment: Modified Independent Sitting-balance support: Feet supported;No upper extremity supported Sitting balance-Leahy Scale: Good     Standing balance support: Bilateral upper extremity supported;During functional activity Standing balance-Leahy Scale: Poor Standing balance comment: requires MOD A to sustain static standing with BUE support                           ADL either performed or assessed with clinical judgement   ADL Overall ADL's : Needs assistance/impaired Eating/Feeding: Minimal assistance;Sitting Eating/Feeding Details (indicate cue type and reason): MIN A to hold cup while pt used built-up spoon to bring ice to mouth. Able to perform 4x without spillage. At baseline, pt uses built-up angled utensils Grooming: Oral care;Supervision/safety;Set up;Sitting Grooming Details (indicate cue type and reason): to brush teeth via oral swab             Lower Body Dressing: Moderate assistance;Sitting/lateral leans Lower Body Dressing Details (indicate cue type and reason): with LE elevated, MOD A to don socks                      Cognition Arousal/Alertness: Awake/alert Behavior During Therapy: [facility]  for tasks assessed/performed Overall Cognitive Status: History of cognitive impairments - at baseline                                            Frequency  Min 2X/week        Progress Toward Goals  OT Goals(current goals can now be found in the care plan section)  Progress towards OT goals: Progressing toward goals  Acute Rehab OT Goals Patient Stated Goal:  require less physical assist with basic ADL mobility OT Goal Formulation: With family Time For Goal Achievement: 12/24/20 Potential to Achieve Goals: [place] Discharge plan remains appropriate;Frequency remains appropriate       AM-PAC OT "6 Clicks" Daily Activity     Outcome Measure   Help from another person eating meals?: A Little Help from another person taking care of personal grooming?: A Lot Help from another person toileting, which includes using toliet, bedpan, or urinal?: Total Help from another person bathing (including washing, rinsing, drying)?: A Lot Help from another person to put on and taking off regular upper body clothing?: A Lot Help from another person to put on and taking off regular lower body clothing?: A Lot 6 Click Score: 12    End of Session Equipment Utilized During Treatment: Gait belt;Rolling walker;Oxygen  OT Visit Diagnosis: Unsteadiness on feet (R26.81);Muscle weakness (generalized) (M62.81);Cognitive communication deficit (R41.841);Other symptoms and signs involving the nervous system (R29.898) Symptoms and signs involving cognitive functions: Cerebral infarction   Activity Tolerance Patient tolerated treatment well   Patient Left in chair;with call bell/phone within reach;with chair alarm set   Nurse Communication Mobility status        Time: 6546-5035 OT Time Calculation (min): 35 min  Charges: OT General Charges $OT Visit: 1 Visit OT Treatments $Self Care/Home Management : 8-22 mins $Therapeutic Activity: 8-22 mins   Fredirick Maudlin, OTR/L [place]

## 2020-12-15 NOTE — Plan of Care (Signed)
PMT note:  Patient sitting in bedside chair, family member present, and on the phone. Will reattempt tomorrow.

## 2020-12-16 DIAGNOSIS — R5382 Chronic fatigue, unspecified: Secondary | ICD-10-CM | POA: Diagnosis not present

## 2020-12-16 DIAGNOSIS — Z743 Need for continuous supervision: Secondary | ICD-10-CM | POA: Diagnosis not present

## 2020-12-16 DIAGNOSIS — Z043 Encounter for examination and observation following other accident: Secondary | ICD-10-CM | POA: Diagnosis not present

## 2020-12-16 DIAGNOSIS — G20 Parkinson's disease: Secondary | ICD-10-CM | POA: Diagnosis not present

## 2020-12-16 DIAGNOSIS — D631 Anemia in chronic kidney disease: Secondary | ICD-10-CM | POA: Diagnosis not present

## 2020-12-16 DIAGNOSIS — E785 Hyperlipidemia, unspecified: Secondary | ICD-10-CM | POA: Diagnosis not present

## 2020-12-16 DIAGNOSIS — I639 Cerebral infarction, unspecified: Secondary | ICD-10-CM | POA: Diagnosis not present

## 2020-12-16 DIAGNOSIS — I959 Hypotension, unspecified: Secondary | ICD-10-CM | POA: Diagnosis not present

## 2020-12-16 DIAGNOSIS — I69351 Hemiplegia and hemiparesis following cerebral infarction affecting right dominant side: Secondary | ICD-10-CM | POA: Diagnosis not present

## 2020-12-16 DIAGNOSIS — E860 Dehydration: Secondary | ICD-10-CM | POA: Diagnosis not present

## 2020-12-16 DIAGNOSIS — Z7901 Long term (current) use of anticoagulants: Secondary | ICD-10-CM | POA: Diagnosis not present

## 2020-12-16 DIAGNOSIS — R9431 Abnormal electrocardiogram [ECG] [EKG]: Secondary | ICD-10-CM | POA: Diagnosis not present

## 2020-12-16 DIAGNOSIS — N1831 Chronic kidney disease, stage 3a: Secondary | ICD-10-CM | POA: Diagnosis not present

## 2020-12-16 DIAGNOSIS — S060X0A Concussion without loss of consciousness, initial encounter: Secondary | ICD-10-CM | POA: Diagnosis not present

## 2020-12-16 DIAGNOSIS — R5383 Other fatigue: Secondary | ICD-10-CM | POA: Diagnosis not present

## 2020-12-16 DIAGNOSIS — R4701 Aphasia: Secondary | ICD-10-CM | POA: Diagnosis not present

## 2020-12-16 DIAGNOSIS — K219 Gastro-esophageal reflux disease without esophagitis: Secondary | ICD-10-CM | POA: Diagnosis not present

## 2020-12-16 DIAGNOSIS — M5136 Other intervertebral disc degeneration, lumbar region: Secondary | ICD-10-CM | POA: Diagnosis not present

## 2020-12-16 DIAGNOSIS — A051 Botulism food poisoning: Secondary | ICD-10-CM | POA: Diagnosis not present

## 2020-12-16 DIAGNOSIS — I2510 Atherosclerotic heart disease of native coronary artery without angina pectoris: Secondary | ICD-10-CM | POA: Diagnosis not present

## 2020-12-16 DIAGNOSIS — E119 Type 2 diabetes mellitus without complications: Secondary | ICD-10-CM | POA: Diagnosis not present

## 2020-12-16 DIAGNOSIS — N179 Acute kidney failure, unspecified: Secondary | ICD-10-CM | POA: Diagnosis not present

## 2020-12-16 DIAGNOSIS — I69391 Dysphagia following cerebral infarction: Secondary | ICD-10-CM | POA: Diagnosis not present

## 2020-12-16 DIAGNOSIS — F0390 Unspecified dementia without behavioral disturbance: Secondary | ICD-10-CM | POA: Diagnosis not present

## 2020-12-16 DIAGNOSIS — S0990XA Unspecified injury of head, initial encounter: Secondary | ICD-10-CM | POA: Diagnosis present

## 2020-12-16 DIAGNOSIS — Z1159 Encounter for screening for other viral diseases: Secondary | ICD-10-CM | POA: Diagnosis not present

## 2020-12-16 DIAGNOSIS — Y92129 Unspecified place in nursing home as the place of occurrence of the external cause: Secondary | ICD-10-CM | POA: Diagnosis not present

## 2020-12-16 DIAGNOSIS — E861 Hypovolemia: Secondary | ICD-10-CM | POA: Diagnosis not present

## 2020-12-16 DIAGNOSIS — W19XXXA Unspecified fall, initial encounter: Secondary | ICD-10-CM | POA: Diagnosis not present

## 2020-12-16 DIAGNOSIS — R279 Unspecified lack of coordination: Secondary | ICD-10-CM | POA: Diagnosis not present

## 2020-12-16 DIAGNOSIS — M50323 Other cervical disc degeneration at C6-C7 level: Secondary | ICD-10-CM | POA: Diagnosis not present

## 2020-12-16 DIAGNOSIS — J189 Pneumonia, unspecified organism: Secondary | ICD-10-CM | POA: Diagnosis not present

## 2020-12-16 DIAGNOSIS — N183 Chronic kidney disease, stage 3 unspecified: Secondary | ICD-10-CM | POA: Diagnosis not present

## 2020-12-16 DIAGNOSIS — R627 Adult failure to thrive: Secondary | ICD-10-CM | POA: Diagnosis not present

## 2020-12-16 DIAGNOSIS — C61 Malignant neoplasm of prostate: Secondary | ICD-10-CM | POA: Diagnosis not present

## 2020-12-16 DIAGNOSIS — I10 Essential (primary) hypertension: Secondary | ICD-10-CM | POA: Diagnosis not present

## 2020-12-16 DIAGNOSIS — S01112A Laceration without foreign body of left eyelid and periocular area, initial encounter: Secondary | ICD-10-CM | POA: Diagnosis not present

## 2020-12-16 DIAGNOSIS — R404 Transient alteration of awareness: Secondary | ICD-10-CM | POA: Diagnosis not present

## 2020-12-16 DIAGNOSIS — I4892 Unspecified atrial flutter: Secondary | ICD-10-CM | POA: Diagnosis not present

## 2020-12-16 DIAGNOSIS — Z87891 Personal history of nicotine dependence: Secondary | ICD-10-CM | POA: Diagnosis not present

## 2020-12-16 DIAGNOSIS — I480 Paroxysmal atrial fibrillation: Secondary | ICD-10-CM | POA: Diagnosis not present

## 2020-12-16 DIAGNOSIS — D638 Anemia in other chronic diseases classified elsewhere: Secondary | ICD-10-CM | POA: Diagnosis not present

## 2020-12-16 DIAGNOSIS — Z8546 Personal history of malignant neoplasm of prostate: Secondary | ICD-10-CM | POA: Diagnosis not present

## 2020-12-16 DIAGNOSIS — R1312 Dysphagia, oropharyngeal phase: Secondary | ICD-10-CM | POA: Diagnosis not present

## 2020-12-16 DIAGNOSIS — I4891 Unspecified atrial fibrillation: Secondary | ICD-10-CM | POA: Diagnosis not present

## 2020-12-16 DIAGNOSIS — I509 Heart failure, unspecified: Secondary | ICD-10-CM | POA: Diagnosis not present

## 2020-12-16 DIAGNOSIS — G9341 Metabolic encephalopathy: Secondary | ICD-10-CM | POA: Diagnosis not present

## 2020-12-16 DIAGNOSIS — Z7982 Long term (current) use of aspirin: Secondary | ICD-10-CM | POA: Diagnosis not present

## 2020-12-16 DIAGNOSIS — R58 Hemorrhage, not elsewhere classified: Secondary | ICD-10-CM | POA: Diagnosis not present

## 2020-12-16 DIAGNOSIS — R4181 Age-related cognitive decline: Secondary | ICD-10-CM | POA: Diagnosis not present

## 2020-12-16 DIAGNOSIS — Z7689 Persons encountering health services in other specified circumstances: Secondary | ICD-10-CM | POA: Diagnosis not present

## 2020-12-16 DIAGNOSIS — H409 Unspecified glaucoma: Secondary | ICD-10-CM | POA: Diagnosis not present

## 2020-12-16 DIAGNOSIS — F339 Major depressive disorder, recurrent, unspecified: Secondary | ICD-10-CM | POA: Diagnosis not present

## 2020-12-16 DIAGNOSIS — I25118 Atherosclerotic heart disease of native coronary artery with other forms of angina pectoris: Secondary | ICD-10-CM | POA: Diagnosis not present

## 2020-12-16 DIAGNOSIS — N401 Enlarged prostate with lower urinary tract symptoms: Secondary | ICD-10-CM | POA: Diagnosis not present

## 2020-12-16 DIAGNOSIS — I130 Hypertensive heart and chronic kidney disease with heart failure and stage 1 through stage 4 chronic kidney disease, or unspecified chronic kidney disease: Secondary | ICD-10-CM | POA: Diagnosis not present

## 2020-12-16 DIAGNOSIS — N189 Chronic kidney disease, unspecified: Secondary | ICD-10-CM | POA: Diagnosis not present

## 2020-12-16 DIAGNOSIS — R531 Weakness: Secondary | ICD-10-CM | POA: Diagnosis not present

## 2020-12-16 DIAGNOSIS — G8190 Hemiplegia, unspecified affecting unspecified side: Secondary | ICD-10-CM | POA: Diagnosis not present

## 2020-12-16 DIAGNOSIS — Z79899 Other long term (current) drug therapy: Secondary | ICD-10-CM | POA: Diagnosis not present

## 2020-12-16 DIAGNOSIS — Z7984 Long term (current) use of oral hypoglycemic drugs: Secondary | ICD-10-CM | POA: Diagnosis not present

## 2020-12-16 DIAGNOSIS — S0083XA Contusion of other part of head, initial encounter: Secondary | ICD-10-CM | POA: Diagnosis not present

## 2020-12-16 DIAGNOSIS — E639 Nutritional deficiency, unspecified: Secondary | ICD-10-CM | POA: Diagnosis not present

## 2020-12-16 DIAGNOSIS — S0003XA Contusion of scalp, initial encounter: Secondary | ICD-10-CM | POA: Diagnosis not present

## 2020-12-16 DIAGNOSIS — Z96653 Presence of artificial knee joint, bilateral: Secondary | ICD-10-CM | POA: Diagnosis not present

## 2020-12-16 LAB — GLUCOSE, CAPILLARY: Glucose-Capillary: 165 mg/dL — ABNORMAL HIGH (ref 70–99)

## 2020-12-16 MED ORDER — METOPROLOL SUCCINATE ER 25 MG PO TB24: 25.0000 mg | ORAL_TABLET | Freq: Every day | ORAL | Status: AC

## 2020-12-16 MED ORDER — AMIODARONE HCL 200 MG PO TABS: 200.0000 mg | ORAL_TABLET | Freq: Every day | ORAL | Status: AC

## 2020-12-16 MED ORDER — ISOSORB DINITRATE-HYDRALAZINE 20-37.5 MG PO TABS: 1.0000 | ORAL_TABLET | Freq: Three times a day (TID) | ORAL | Status: AC

## 2020-12-16 NOTE — Progress Notes (Addendum)
Patient packed and dressed by NT Krista at time of discharge. EMS to transport patient to [facility]. Discharge packet provided to EMS for transport.  Vitals WDL at the time of discharge.

## 2020-12-16 NOTE — Progress Notes (Signed)
Progress Note  Patient Name: Todd [name] Date of Encounter: 12/16/2020  [facility] Cardiologist: Ida Rogue, MD   Subjective   No complaints this morning Not in any distress, has eaten breakfast No family at bedside  Inpatient Medications    Scheduled Meds:  amiodarone  200 mg Oral Daily   apixaban  5 mg Oral BID   atorvastatin  20 mg Oral q1800   budesonide (PULMICORT) nebulizer solution  0.5 mg Nebulization BID   carbidopa-levodopa  1 tablet Oral TID AC   DULoxetine  30 mg Oral Daily   insulin aspart  0-9 Units Subcutaneous TID WC   ipratropium-albuterol  3 mL Nebulization TID   isosorbide-hydrALAZINE  1 tablet Oral TID   latanoprost  1 drop Both Eyes QHS   magnesium oxide  400 mg Oral BID   melatonin  2.5 mg Oral QHS   metoprolol succinate  25 mg Oral Daily   polyethylene glycol  17 g Oral Daily   sodium chloride flush  3 mL Intravenous Q12H   Continuous Infusions:  sodium chloride Stopped (12/09/20 1234)   PRN Meds: acetaminophen **OR** acetaminophen, bisacodyl, lactulose, ondansetron **OR** ondansetron (ZOFRAN) IV   Vital Signs    Vitals:   12/16/20 0500 12/16/20 0725 12/16/20 0758 12/16/20 1105  BP:  (!) 154/94  (!) 154/82  Pulse:  84  92  Resp:  18  17  Temp:  98.1 F (36.7 C)  97.7 F (36.5 C)  TempSrc:      SpO2:  99% 99% 97%  Weight: 83.4 kg     Height:        Intake/Output Summary (Last 24 hours) at 12/16/2020 1503 Last data filed at 12/16/2020 0950 Gross per 24 hour  Intake 720 ml  Output 1050 ml  Net -330 ml   Last 3 Weights 12/16/2020 12/15/2020 12/14/2020  Weight (lbs) 183 lb 13.8 oz 184 lb 4.9 oz 182 lb 12.2 oz  Weight (kg) 83.4 kg 83.6 kg 82.9 kg      Telemetry    Normal sinus rhythm- Personally Reviewed  ECG    - Personally Reviewed  Physical Exam    Constitutional: Alert, difficult to communicate with no distress.  HENT:  Head: Grossly normal Eyes:  no discharge. No scleral icterus.  Neck: No JVD, no carotid  bruits  Cardiovascular: Regular rate and rhythm, no murmurs appreciated Pulmonary/Chest: Clear to auscultation bilaterally, no wheezes or rails Abdominal: Soft.  no distension.  no tenderness.  Musculoskeletal: Normal range of motion Neurological:   grossly nonfocal, minimally verbal Skin: Skin warm and dry Psychiatric: normal affect, pleasant  Labs    High Sensitivity Troponin:   Recent Labs  Lab 12/07/20 1719 12/07/20 1955 12/08/20 2307 12/09/20 0204  TROPONINIHS 95* 96* 127* 149*      Chemistry Recent Labs  Lab 12/11/20 0536 12/13/20 0446 12/15/20 0536  NA 137 137 140  K 3.8 4.0 5.1  CL 103 104 103  CO2 26 29 30   GLUCOSE 158* 154* 156*  BUN 35* 23 17  CREATININE 1.32* 1.03 1.10  CALCIUM 8.4* 8.4* 9.2  GFRNONAA 52* >60 >60  ANIONGAP 8 4* 7     Hematology Recent Labs  Lab 12/11/20 0536 12/12/20 0847 12/15/20 0536  WBC 7.0 6.3 6.4  RBC 2.84* 3.13* 3.01*  HGB 8.7* 9.6* 9.1*  HCT 27.2* 30.6* 29.4*  MCV 95.8 97.8 97.7  MCH 30.6 30.7 30.2  MCHC 32.0 31.4 31.0  RDW 12.9 12.8 12.8  PLT 175 191  239    BNP No results for input(s): BNP, PROBNP in the last 168 hours.    DDimer No results for input(s): DDIMER in the last 168 hours.   Radiology    No results found.  Cardiac Studies   87 y.o. 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   Patient Profile     Afib/flutter DCCV to SR 7/8, has remained in normal sinus rhythm since that time Continue Eliquis 5mg  BID,  metoprolol 25mg  BID Amiodarone 400 twice daily, can likely transition to 200 twice daily this weekend   Acute on chronic respiratory failure with hypoxia/systolic dysfunction - Echo :mild systolic dysfunction with EF 50%, in the setting of tachy-arrhythmia. Last echo  11/2019 showed LVEF 50-55% - continue metoprolol, BiDil - No ACE/ARB/ARNI/SGLT2i with AKI  -Lasix has been held most of this admission secondary to poor mentation and poor p.o. intake.  This could be reinitiated at discharge arrange follow-up in clinic  Elevated troponin CAD troponin  149, demand ischemia in the setting of rapid afib, AMS, PNA, AKI, anemia, electrolyte disorder - Normal MPI in 2018 - Echo as above - No ASA with chronic anticoagulation No plan for ischemic work-up at this time   AKI - diuretics held but would plan to reinitiate at discharge or close follow-up in clinic   Anemia Hemoglobin 9.1 Stable   Total encounter time more than 25 minutes  Greater than 50% was spent in counseling and coordination of care with the patient   For questions or updates, please contact [facility] Please consult [email or web address] for contact info under        Signed, Ida Rogue, MD  12/16/2020, 3:04 PM

## 2020-12-16 NOTE — Discharge Summary (Signed)
Physician Discharge Summary  Patient ID: Todd [name] MRN: [id number] DOB/AGE: 1931/02/27 87 y.o.  Admit date: 12/07/2020 Discharge date: 12/16/2020  Admission Diagnoses:  Discharge Diagnoses:  Principal Problem:   Paroxysmal atrial fibrillation with rapid ventricular response (HCC) Active Problems:   Hypertension   Type 2 diabetes mellitus with hyperlipidemia (HCC)   Anxiety and depression   Lethargy   CAD (coronary artery disease)   History of stroke with residual deficit   Mixed hyperlipidemia   Weakness   Depression, major, single episode, severe (HCC)   Normocytic anemia   Anemia due to stage 3 chronic kidney disease (HCC)   Atrial fibrillation and flutter (HCC)   Hypomagnesemia   Ischemic stroke ([place])   Pneumonia of right lower lobe due to infectious organism   Acute kidney injury superimposed on CKD ([place])   Acute metabolic encephalopathy   Wheeze   Anemia of chronic disease   Discharged Condition: fair  Hospital Course:   Patient was admitted 12/07/2020 with generalized weakness and atrial fibrillation.  The patient was cardioverted on 12/10/2020 into normal sinus rhythm by cardiology.  The patient was placed on antibiotics for right lower lobe pneumonia on Rocephin and doxycycline and finish the course.  The patient has had intermittent agitation where he is being given meds and then has been lethargic after medications.  The patient's mental status was better yesterday but the patient was given Haldol last night and mental status today is impaired.  Awaiting insurance authorization to go out to rehab.  Past medical history stroke with chronic right-sided weakness, prostate cancer, hypertension, hyperlipidemia, type 2 diabetes mellitus, depression, Parkinson's.  #1.  Acute metabolic encephalopathy. Failure to thrive. Generalized weakness Met patient daughter-in-law, patient condition has been deteriorating since January.  He sleeps around 18 hours a day. His condition  probably terminal, please continue palliative care.  2.  Paroxysmal atrial fibrillation.  S/p cardioversion on 7/8. Currently still in sinus rhythm, continue amiodarone and Eliquis.  3.  Right lower lobe pneumonia. Completed antibiotics   4.  acute kidney injury. Hypomagnesemia. Renal function has improved.   #5.  Right-sided weakness with history of ischemic stroke. Continue home medicines.  6.  Type 2 diabetes with dyslipidemia. Resume home medicines.  7.  Anemia of chronic disease. Hemoglobin stable.    Consults: cardiology  Significant Diagnostic Studies:   Echo: Left ventricular ejection fraction, by estimation, is 45 to 50%. The left ventricle has mildly decreased function. The left ventricle demonstrates global hypokinesis. The left ventricular internal cavity size was mildly dilated. There is moderate left ventricular hypertrophy. Left ventricular diastolic parameters are consistent with Grade II diastolic dysfunction (pseudonormalization). Elevated left ventricular end-diastolic pressure. 2. Right ventricular systolic function is mildly reduced. The right ventricular size is mildly enlarged. 3. Left atrial size was mildly dilated. 4. Right atrial size was mildly dilated. 5. The mitral valve is abnormal. Mild mitral valve regurgitation. No evidence of mitral stenosis. Moderate mitral annular calcification. 6. The aortic valve is tricuspid. There is moderate calcification of the aortic valve. Aortic valve regurgitation is not visualized. Mild to moderate aortic valve stenosis. 7. Aortic dilatation noted. There is mild dilatation of the ascending aorta, measuring 38 mm. 8. The inferior vena cava is normal in size with <50% respiratory variability, suggesting right atrial pressure of 8 mmHg.  CT ABDOMEN AND PELVIS WITHOUT CONTRAST   TECHNIQUE: Multidetector CT imaging of the abdomen and pelvis was performed following the standard protocol without IV contrast.    COMPARISON:  CT  11/11/2018   FINDINGS: Lower chest: Lung bases demonstrate no acute consolidation or effusion. Cardiomegaly. Coronary vascular calcification.   Hepatobiliary: No focal hepatic abnormality. Small calcified gallstones. No biliary dilatation   Pancreas: Unremarkable. No pancreatic ductal dilatation or surrounding inflammatory changes.   Spleen: Normal in size without focal abnormality.   Adrenals/Urinary Tract: Adrenal glands are within normal limits. Kidneys show no hydronephrosis. Low-density lesions in both kidneys likely cysts. Punctate stone lower pole right kidney. The bladder is unremarkable.   Stomach/Bowel: The stomach is nonenlarged. No dilated small bowel. No acute bowel wall thickening. Nonvisualized appendix.   Vascular/Lymphatic: Moderate aortic atherosclerosis. No aneurysm. No suspicious nodes.   Reproductive: Post treatment changes of the prostate.   Other: Negative for pelvic effusion or free air   Musculoskeletal: Scoliosis and advanced degenerative changes throughout the spine   IMPRESSION: 1. No CT evidence for acute intra-abdominal or pelvic abnormality. 2. Gallstones 3. Punctate nonobstructing right kidney stone 4. Cardiomegaly     Electronically Signed   By: Donavan Foil M.D.   On: 12/07/2020 21:53    Treatments: Antibiotics, amio, beta blocker, cardioversion  Discharge Exam: Blood pressure (!) 154/94, pulse 84, temperature 98.1 F (36.7 C), resp. rate 18, height _0  (1.727 m), weight 83.4 kg, SpO2 99 %. General appearance: alert and cooperative Resp: clear to auscultation bilaterally Cardio: regular rate and rhythm, S1, S2 normal, no murmur, click, rub or gallop GI: soft, non-tender; bowel sounds normal; no masses,  no organomegaly Extremities: extremities normal, atraumatic, no cyanosis or edema  Disposition: Discharge disposition: 03-Skilled Nursing Facility       Discharge Instructions     Diet - low sodium  heart healthy   Complete by: As directed    Increase activity slowly   Complete by: As directed       Allergies as of 12/16/2020       Reactions   Amlodipine    Lower extremity swelling at 10 mg.   Fluoxetine Other (See Comments)   headache   Meloxicam Other (See Comments)   Other reaction(s): Dizziness Nervousness.        Medication List     STOP taking these medications    digoxin 0.125 MG tablet Commonly known as: LANOXIN   furosemide 20 MG tablet Commonly known as: LASIX   hydrALAZINE 50 MG tablet Commonly known as: APRESOLINE   naproxen sodium 220 MG tablet Commonly known as: ALEVE   propranolol ER 120 MG 24 hr capsule Commonly known as: INDERAL LA       TAKE these medications    Accu-Chek Aviva Plus w/Device Kit Use to check sugar levels x 2 daily   accu-chek soft touch lancets Use as instructed   acetaminophen 650 MG CR tablet Commonly known as: TYLENOL Take 650 mg by mouth every 8 (eight) hours as needed for pain.   amiodarone 200 MG tablet Commonly known as: PACERONE Take 1 tablet (200 mg total) by mouth daily. Start taking on: December 17, 2020   apixaban 5 MG Tabs tablet Commonly known as: Eliquis Take 1 tablet (5 mg total) by mouth 2 (two) times daily.   aspirin EC 81 MG tablet Take 81 mg by mouth daily. Swallow whole.   atorvastatin 20 MG tablet Commonly known as: LIPITOR Take 1 tablet (20 mg total) by mouth daily.   blood glucose meter kit and supplies Kit Dispense based on patient and insurance preference. Use up to four times daily as directed. (FOR ICD-9 250.00, 250.01).  carbidopa-levodopa 25-100 MG tablet Commonly known as: SINEMET IR Take 1 tablet by mouth 3 (three) times daily.   docusate sodium 100 MG capsule Commonly known as: COLACE Take 1 capsule (100 mg total) by mouth 2 (two) times daily as needed for mild constipation.   DULoxetine 30 MG capsule Commonly known as: CYMBALTA Take 1 capsule (30 mg total) by  mouth daily.   empagliflozin 25 MG Tabs tablet Commonly known as: Jardiance Take 1 tablet (25 mg total) by mouth daily before breakfast.   gabapentin 100 MG capsule Commonly known as: NEURONTIN Take 1 capsule (100 mg total) by mouth at bedtime.   isosorbide-hydrALAZINE 20-37.5 MG tablet Commonly known as: BIDIL Take 1 tablet by mouth 3 (three) times daily.   latanoprost 0.005 % ophthalmic solution Commonly known as: XALATAN Place 1 drop into both eyes at bedtime.   magnesium oxide 400 (240 Mg) MG tablet Commonly known as: MAG-OX Take 1 tablet (400 mg total) by mouth 2 (two) times daily.   metFORMIN 500 MG tablet Commonly known as: GLUCOPHAGE Take 1 tablet (500 mg total) by mouth 2 (two) times daily with a meal.   metoprolol succinate 25 MG 24 hr tablet Commonly known as: TOPROL-XL Take 1 tablet (25 mg total) by mouth daily. Start taking on: December 17, 2020   MIRALAX PO Take 17 g by mouth as needed.   OneTouch Ultra test strip Generic drug: glucose blood USE TO CHECK SUGAR LEVELS TWICE DAILY.   pantoprazole 40 MG tablet Commonly known as: PROTONIX Take 1 tablet (40 mg total) by mouth daily.   tamsulosin 0.4 MG Caps capsule Commonly known as: FLOMAX Take 1 capsule (0.4 mg total) by mouth daily.        Contact information for follow-up providers     Johnson, Megan P, DO Follow up in 1 week(s).   Specialty: Family Medicine Contact information: [place] [address] [phone]         Minna Merritts, MD Follow up in 1 week(s).   Specialty: Cardiology Contact information: [place] [address] [phone]              Contact information for after-discharge care     [facility] .   Service: Skilled Nursing Contact information: [place] [phone]                    32 minutes Signed: Sharen Hones 12/16/2020, 9:06 AM

## 2020-12-17 ENCOUNTER — Telehealth: Payer: Self-pay

## 2020-12-17 DIAGNOSIS — I6930 Unspecified sequelae of cerebral infarction: Secondary | ICD-10-CM

## 2020-12-17 DIAGNOSIS — R4701 Aphasia: Secondary | ICD-10-CM

## 2020-12-17 DIAGNOSIS — E1142 Type 2 diabetes mellitus with diabetic polyneuropathy: Secondary | ICD-10-CM

## 2020-12-17 DIAGNOSIS — I69359 Hemiplegia and hemiparesis following cerebral infarction affecting unspecified side: Secondary | ICD-10-CM

## 2020-12-17 DIAGNOSIS — G250 Essential tremor: Secondary | ICD-10-CM

## 2020-12-17 DIAGNOSIS — I5042 Chronic combined systolic (congestive) and diastolic (congestive) heart failure: Secondary | ICD-10-CM

## 2020-12-20 ENCOUNTER — Telehealth: Payer: Self-pay

## 2020-12-20 NOTE — Telephone Encounter (Signed)
Copied from [place] [id number]. Topic: General - Inquiry >> Dec 20, 2020 10:23 AM Loma Boston wrote: Reason for CRM: Todd [name] # [phone] has called in stating that her dad had to go to ER and has been in Rehab at [facility]. She is wanting input from Dr Lenna Sciara as he sleeps all the time and cannot stay awake to even eat and wants meds changed as put on 3 new meds. I advised her to make sure and let the dr or nurse in charge of him at the rehab since they had prescribed and that I would forward the concern to Dr Wynetta Emery.

## 2020-12-20 NOTE — Telephone Encounter (Signed)
Yes. They need to discuss this with his doctor at the facility

## 2020-12-20 NOTE — Telephone Encounter (Signed)
Patient daughter aware of provider advise.

## 2020-12-22 ENCOUNTER — Telehealth: Payer: Self-pay

## 2020-12-22 DIAGNOSIS — E119 Type 2 diabetes mellitus without complications: Secondary | ICD-10-CM | POA: Diagnosis not present

## 2020-12-22 DIAGNOSIS — R4701 Aphasia: Secondary | ICD-10-CM | POA: Diagnosis not present

## 2020-12-22 DIAGNOSIS — I639 Cerebral infarction, unspecified: Secondary | ICD-10-CM | POA: Diagnosis not present

## 2020-12-22 DIAGNOSIS — I480 Paroxysmal atrial fibrillation: Secondary | ICD-10-CM | POA: Diagnosis not present

## 2020-12-22 NOTE — Telephone Encounter (Signed)
Copied from [place] [id number]. Topic: General - Other >> Dec 21, 2020  4:49 PM Celene Kras wrote: Reason for CRM: Pts daughter, [name], calling stating that they are needing to have the authorization from PCP stating that the pt can be transferred from [facility] to [facility]. She states that these are needing to have these faxed to [facility]. She states that they are not happy with the care he is currently receiving. Please advise.

## 2020-12-22 NOTE — Telephone Encounter (Signed)
Please assist

## 2020-12-22 NOTE — Telephone Encounter (Signed)
Best contact: [phone]  [name] called back and verbalized understanding however she is requesting a call back from office for assistance. She has questions about the instructions she has been given from [facility] and his insurance.

## 2020-12-22 NOTE — Telephone Encounter (Signed)
Is this something that you can do?

## 2020-12-22 NOTE — Telephone Encounter (Signed)
No, that is not something I can do. They can request the transfer- but I don't have anything to do with the SNFs

## 2020-12-23 NOTE — Telephone Encounter (Signed)
The patients other sister Matyas Baisley called Jean Rosenthal how she is supposed to go about having her brother moved from one facility to another.  CB#  [phone]

## 2020-12-23 NOTE — Telephone Encounter (Signed)
Todd [name] was still on the phone when dr Wynetta Emery answered this.  That is pretty much what I was explaining to her.  Thank you.   Todd [name] verbalized unersatnding

## 2020-12-23 NOTE — Telephone Encounter (Signed)
Todd [name] calling back to ask Dr Wynetta Emery to send an order to [facility] for the pt to be transferred/admitted to them from [facility].   [facility] Address: [address] [place], [place], [place] [address] Phone: [phone]

## 2020-12-23 NOTE — Telephone Encounter (Signed)
She needs to discuss this with the social worker at the facility. This is not something I can arrange.

## 2020-12-24 DIAGNOSIS — F0390 Unspecified dementia without behavioral disturbance: Secondary | ICD-10-CM | POA: Diagnosis not present

## 2020-12-24 DIAGNOSIS — G20 Parkinson's disease: Secondary | ICD-10-CM | POA: Diagnosis not present

## 2020-12-24 DIAGNOSIS — Z7689 Persons encountering health services in other specified circumstances: Secondary | ICD-10-CM | POA: Diagnosis not present

## 2020-12-24 DIAGNOSIS — R627 Adult failure to thrive: Secondary | ICD-10-CM | POA: Diagnosis not present

## 2020-12-27 ENCOUNTER — Other Ambulatory Visit: Payer: Self-pay

## 2020-12-27 ENCOUNTER — Emergency Department
Admission: EM | Admit: 2020-12-27 | Discharge: 2020-12-27 | Disposition: A | Discharge status: Home / Self Care | Payer: HMO | Attending: Emergency Medicine | Admitting: Emergency Medicine

## 2020-12-27 ENCOUNTER — Emergency Department: Payer: HMO

## 2020-12-27 ENCOUNTER — Telehealth: Payer: Self-pay

## 2020-12-27 DIAGNOSIS — I4891 Unspecified atrial fibrillation: Secondary | ICD-10-CM | POA: Diagnosis not present

## 2020-12-27 DIAGNOSIS — I130 Hypertensive heart and chronic kidney disease with heart failure and stage 1 through stage 4 chronic kidney disease, or unspecified chronic kidney disease: Secondary | ICD-10-CM | POA: Diagnosis not present

## 2020-12-27 DIAGNOSIS — I509 Heart failure, unspecified: Secondary | ICD-10-CM | POA: Insufficient documentation

## 2020-12-27 DIAGNOSIS — Z7901 Long term (current) use of anticoagulants: Secondary | ICD-10-CM | POA: Diagnosis not present

## 2020-12-27 DIAGNOSIS — S060X0A Concussion without loss of consciousness, initial encounter: Secondary | ICD-10-CM | POA: Diagnosis not present

## 2020-12-27 DIAGNOSIS — S0990XA Unspecified injury of head, initial encounter: Secondary | ICD-10-CM | POA: Diagnosis not present

## 2020-12-27 DIAGNOSIS — Y92129 Unspecified place in nursing home as the place of occurrence of the external cause: Secondary | ICD-10-CM | POA: Diagnosis not present

## 2020-12-27 DIAGNOSIS — R9431 Abnormal electrocardiogram [ECG] [EKG]: Secondary | ICD-10-CM | POA: Diagnosis not present

## 2020-12-27 DIAGNOSIS — E119 Type 2 diabetes mellitus without complications: Secondary | ICD-10-CM | POA: Insufficient documentation

## 2020-12-27 DIAGNOSIS — Z96653 Presence of artificial knee joint, bilateral: Secondary | ICD-10-CM | POA: Diagnosis not present

## 2020-12-27 DIAGNOSIS — M50323 Other cervical disc degeneration at C6-C7 level: Secondary | ICD-10-CM | POA: Diagnosis not present

## 2020-12-27 DIAGNOSIS — Z7982 Long term (current) use of aspirin: Secondary | ICD-10-CM | POA: Insufficient documentation

## 2020-12-27 DIAGNOSIS — Z043 Encounter for examination and observation following other accident: Secondary | ICD-10-CM | POA: Diagnosis not present

## 2020-12-27 DIAGNOSIS — S0083XA Contusion of other part of head, initial encounter: Secondary | ICD-10-CM

## 2020-12-27 DIAGNOSIS — I2510 Atherosclerotic heart disease of native coronary artery without angina pectoris: Secondary | ICD-10-CM | POA: Insufficient documentation

## 2020-12-27 DIAGNOSIS — Z7984 Long term (current) use of oral hypoglycemic drugs: Secondary | ICD-10-CM | POA: Diagnosis not present

## 2020-12-27 DIAGNOSIS — Z87891 Personal history of nicotine dependence: Secondary | ICD-10-CM | POA: Diagnosis not present

## 2020-12-27 DIAGNOSIS — S01112A Laceration without foreign body of left eyelid and periocular area, initial encounter: Secondary | ICD-10-CM | POA: Diagnosis not present

## 2020-12-27 DIAGNOSIS — Z8546 Personal history of malignant neoplasm of prostate: Secondary | ICD-10-CM | POA: Insufficient documentation

## 2020-12-27 DIAGNOSIS — W19XXXA Unspecified fall, initial encounter: Secondary | ICD-10-CM | POA: Diagnosis not present

## 2020-12-27 DIAGNOSIS — Z79899 Other long term (current) drug therapy: Secondary | ICD-10-CM | POA: Diagnosis not present

## 2020-12-27 DIAGNOSIS — N183 Chronic kidney disease, stage 3 unspecified: Secondary | ICD-10-CM | POA: Diagnosis not present

## 2020-12-27 DIAGNOSIS — S0003XA Contusion of scalp, initial encounter: Secondary | ICD-10-CM | POA: Diagnosis not present

## 2020-12-27 NOTE — Telephone Encounter (Signed)
If we can get an FL2, we can fill it out.

## 2020-12-27 NOTE — ED Triage Notes (Addendum)
Pt comes into the ED via [place] from [facility] c/o witnessed mechanical fall.  Pt did hit his head but denies any LOC.  PT is currently on blood thinners.  Pt presents with hematoma to the forehead with small laceration.  Per facility he is at his baseline neurologically.  Right bundle branch shown on EMS EKG. CBG 168, 168/68, 70 HR, 98 % @ 4L which is chronic.

## 2020-12-27 NOTE — ED Provider Notes (Signed)
[facility] Emergency Department Provider Note   ____________________________________________   Event Date/Time   First MD Initiated Contact with Patient 12/27/20 1121     (approximate)  I have reviewed the triage vital signs and the nursing notes.   HISTORY  Chief Complaint Fall    HPI Todd [name] is a 87 y.o. male who presents via EMS from a long-term care facility after a witnessed fall in which patient suffered a small contusion and laceration to the medial left eyebrow.  Per facility/EMS patient is at his baseline neurologically which is minimally able to participate in answering questions.  Unclear loss of consciousness.  Patient denies any other complaints.  Facility medication list does show Eliquis.          Past Medical History:  Diagnosis Date   Anxiety    Aortic regurgitation    a. 03/2017 Echo: Mod AI; b. 05/2017 Echo: Triv AI.   Arthritis    osteoarthritis-left knee   CAD (coronary artery disease)    a. Reported h/o cath ~ 2008 - ? small vessel dzs. No PCI performed; b. 03/2017 MV: EF 45-54%, no ischemia/infarct.   Depression    Diabetes mellitus without complication ([place])    Diastolic dysfunction    a. 03/2017 Echo: EF 55-65%; b. 05/2017 Echo: EF 55-60%, no rwma, Gr1 DD. Triv AI.  Mildly dil LA.   ED (erectile dysfunction)    Elevated troponin    a. chronic mild elevation - 0.03->0.04.   GERD (gastroesophageal reflux disease)    Glaucoma    Hyperlipidemia    Hypertension    Iron deficiency anemia due to chronic blood loss    Left pontine stroke w/ cerebrovascular disease(HCC)    a. 05/2017 MRI/A: acute to subacute L pontine infarct. Chronic left thalamic lacunar infarct. Severe basilar artery stenosis w/ radiographic string sign of the distal 1/3. Mild to moderate L PCA P2 segment stenosis; b. 05/2017 Carotid U/S: <50% bilat ICA stenosis.   Lumbago    Lumbosacral Neuritis   Prostate cancer [facility])     Patient Active Problem  List   Diagnosis Date Noted   Anemia of chronic disease    Wheeze    Pneumonia of right lower lobe due to infectious organism    Acute kidney injury superimposed on CKD ([place])    Acute metabolic encephalopathy    Atrial fibrillation and flutter ([place]) 12/08/2020   Hypomagnesemia    Ischemic stroke (HCC)    Paroxysmal atrial fibrillation with rapid ventricular response ([place]) 12/07/2020   Lower urinary tract symptoms (LUTS) 05/19/2020   Atrial fibrillation with rapid ventricular response ([place]) 11/11/2018   Frequent PVCs    Anemia due to stage 3 chronic kidney disease ([place]) 09/16/2018   Normocytic anemia 05/17/2018   Hemiplegia ([place]) 04/25/2018   B12 deficiency 04/25/2018   Dysphagia as late effect of cerebrovascular accident (CVA) 04/25/2018   Depression, major, single episode, severe ([place]) 04/25/2018   Weakness 04/20/2018   CHF (congestive heart failure) ([place]) 01/09/2018   Advanced care planning/counseling discussion 12/20/2017   Hypoxia 11/02/2017   Mixed hyperlipidemia 09/21/2017   Aphasia 05/18/2017   CAD (coronary artery disease) 05/18/2017   History of stroke with residual deficit 05/18/2017   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 11/09/2015   Knee joint replaced by other means 07/04/2015   Urinary incontinence 04/27/2015   Anxiety, generalized 04/07/2015   History of BCG vaccination 04/07/2015   GERD (gastroesophageal reflux disease) 03/04/2015   Lethargy 03/01/2015   Anxiety  and depression 02/25/2015   Degeneration of intervertebral disc of lumbar region 02/23/2015   Neuritis or radiculitis due to rupture of lumbar intervertebral disc 02/23/2015   Lumbar stenosis with neurogenic claudication 02/23/2015   Hypertension 12/02/2014   Type 2 diabetes mellitus with hyperlipidemia ([place]) 12/02/2014   Benign essential tremor 03/13/2014   Imbalance 03/13/2014   Prostate cancer ([place]) 10/07/2012   ED (erectile dysfunction) of organic origin 02/09/2012    Past Surgical History:   Procedure Laterality Date   CARDIOVERSION N/A 12/10/2020   Procedure: CARDIOVERSION;  Surgeon: Nelva Bush, MD;  Location: [facility] ORS;  Service: Cardiovascular;  Laterality: N/A;   [place]      Prior to Admission medications   Medication Sig Start Date End Date Taking? Authorizing Provider  acetaminophen (TYLENOL) 650 MG CR tablet Take 650 mg by mouth every 8 (eight) hours as needed for pain.    [provider]  amiodarone (PACERONE) 200 MG tablet Take 1 tablet (200 mg total) by mouth daily. 12/17/20   Sharen Hones, MD  apixaban (ELIQUIS) 5 MG TABS tablet Take 1 tablet (5 mg total) by mouth 2 (two) times daily. 11/29/20   Park Liter P, DO  aspirin EC 81 MG tablet Take 81 mg by mouth daily. Swallow whole.    [provider]  atorvastatin (LIPITOR) 20 MG tablet Take 1 tablet (20 mg total) by mouth daily. 11/29/20   Park Liter P, DO  blood glucose meter kit and supplies KIT Dispense based on patient and insurance preference. Use up to four times daily as directed. (FOR ICD-9 250.00, 250.01). 11/17/18   Dustin Flock, MD  Blood Glucose Monitoring Suppl (ACCU-CHEK AVIVA PLUS) w/Device KIT Use to check sugar levels x 2 daily 11/17/18   Dustin Flock, MD  carbidopa-levodopa (SINEMET IR) 25-100 MG tablet Take 1 tablet by mouth 3 (three) times daily. 09/22/20   [provider]  docusate sodium (COLACE) 100 MG capsule Take 1 capsule (100 mg total) by mouth 2 (two) times daily as needed for mild constipation. 11/29/20   Johnson, Megan P, DO  DULoxetine (CYMBALTA) 30 MG capsule Take 1 capsule (30 mg total) by mouth daily. 11/29/20   Park Liter P, DO  empagliflozin (JARDIANCE) 25 MG TABS tablet Take 1 tablet (25 mg total) by mouth daily before breakfast. 11/29/20   Park Liter P, DO  gabapentin (NEURONTIN) 100 MG capsule Take 1 capsule (100 mg total) by mouth at bedtime. 11/29/20   Johnson, Megan P, DO   isosorbide-hydrALAZINE (BIDIL) 20-37.5 MG tablet Take 1 tablet by mouth 3 (three) times daily. 12/16/20   Sharen Hones, MD  Lancets (ACCU-CHEK SOFT [facility]) lancets Use as instructed 11/17/18   Dustin Flock, MD  latanoprost (XALATAN) 0.005 % ophthalmic solution Place 1 drop into both eyes at bedtime.  06/08/16   [provider]  Magnesium Oxide 400 (240 Mg) MG TABS Take 1 tablet (400 mg total) by mouth 2 (two) times daily. 07/01/19   Dunn, Areta Haber, PA-C  metFORMIN (GLUCOPHAGE) 500 MG tablet Take 1 tablet (500 mg total) by mouth 2 (two) times daily with a meal. 11/29/20   Johnson, Megan P, DO  metoprolol succinate (TOPROL-XL) 25 MG 24 hr tablet Take 1 tablet (25 mg total) by mouth daily. 12/17/20   Sharen Hones, MD  [facility] ULTRA test strip USE TO CHECK SUGAR LEVELS TWICE DAILY. 11/18/18   Guadalupe Maple, MD  pantoprazole (PROTONIX) 40 MG tablet Take  1 tablet (40 mg total) by mouth daily. 11/29/20   Park Liter P, DO  Polyethylene Glycol 3350 (MIRALAX PO) Take 17 g by mouth as needed.    [provider]  tamsulosin (FLOMAX) 0.4 MG CAPS capsule Take 1 capsule (0.4 mg total) by mouth daily. 05/31/20   Park Liter P, DO    Allergies Amlodipine, Fluoxetine, and Meloxicam  Family History  Problem Relation Age of Onset   Stroke Mother    Hypertension Mother    Hypertension Father    Stroke Sister    Prostate cancer Neg Hx    Kidney cancer Neg Hx    Bladder Cancer Neg Hx     Social History Social History   Tobacco Use   Smoking status: Former    Types: Cigarettes    Quit date: 12/02/1994    Years since quitting: 26.0   Smokeless tobacco: Never  Vaping Use   Vaping Use: Never used  Substance Use Topics   Alcohol use: Yes    Alcohol/week: 1.0 standard drink    Types: 1 Cans of beer per week    Comment: occasional   Drug use: No    Review of Systems Unable to assess   ____________________________________________   PHYSICAL EXAM:  VITAL SIGNS: ED Triage  Vitals  Enc Vitals Group     BP 12/27/20 0823 (!) 131/95     Pulse Rate 12/27/20 0823 77     Resp 12/27/20 0823 20     Temp 12/27/20 0823 97.9 F (36.6 C)     Temp Source 12/27/20 0823 Axillary     SpO2 12/27/20 0823 92 %     Weight 12/27/20 0821 183 lb 13.8 oz (83.4 kg)     Height 12/27/20 0821 5' 8"  (1.727 m)     Head Circumference --      Peak Flow --      Pain Score --      Pain Loc --      Pain Edu? --      Excl. in [place]? --    Constitutional: Alert.  Well appearing elderly African-American male in no acute distress. Eyes: Conjunctivae are normal. PERRL. Head: Left eyebrow contusion Nose: No congestion/rhinnorhea. Mouth/Throat: Mucous membranes are moist. Neck: No stridor Cardiovascular: Grossly normal heart sounds.  Good peripheral circulation. Respiratory: Normal respiratory effort.  No retractions. Gastrointestinal: Soft and nontender. No distention. Musculoskeletal: No obvious deformities Neurologic:  Normal speech and language. No gross focal neurologic deficits are appreciated. Skin:  Skin is warm and dry. No rash noted.  2 cm x 3 cm eyebrow contusion to the left eyebrow with a small punctate laceration that is hemostatic Psychiatric: Mood and affect are normal. Speech and behavior are normal.  ____________________________________________   LABS (all labs ordered are listed, but only abnormal results are displayed)  Labs Reviewed - No data to display  RADIOLOGY  ED MD interpretation: CT of the head without contrast shows no evidence of acute abnormalities including no intracerebral hemorrhage, obvious masses, or significant edema  CT of the cervical spine without contrast shows no evidence of acute abnormalities including no fractures, dislocations, or obvious aneurysms  Official radiology report(s): CT Head Wo Contrast  Result Date: 12/27/2020 CLINICAL DATA:  Head laceration after fall. EXAM: CT HEAD WITHOUT CONTRAST CT CERVICAL SPINE WITHOUT CONTRAST  TECHNIQUE: Multidetector CT imaging of the head and cervical spine was performed following the standard protocol without intravenous contrast. Multiplanar CT image reconstructions of the cervical spine were also generated.  COMPARISON:  December 12, 2020. FINDINGS: CT HEAD FINDINGS Brain: Mild chronic ischemic white matter disease is noted. No mass effect or midline shift is noted. Ventricular size is within normal limits. There is no evidence of mass lesion, hemorrhage or acute infarction. Vascular: No hyperdense vessel or unexpected calcification. Skull: Normal. Negative for fracture or focal lesion. Sinuses/Orbits: No acute finding. Other: Small left frontal scalp hematoma is noted. CT CERVICAL SPINE FINDINGS Alignment: Mild grade 1 retrolisthesis of C3-4 is noted secondary to severe degenerative disc disease at this level. Mild grade 1 retrolisthesis of C6-7 is also noted secondary to severe degenerative disc disease. Skull base and vertebrae: No acute fracture. No primary bone lesion or focal pathologic process. Soft tissues and spinal canal: No prevertebral fluid or swelling. No visible canal hematoma. Disc levels: Severe degenerative disc disease is noted at C3-4, C6-7 and C7-T1. Upper chest: Negative. Other: Degenerative changes seen involving the left-sided posterior facet joints. IMPRESSION: Small left frontal scalp hematoma. No acute intracranial abnormality seen. Severe multilevel degenerative disc disease. No acute abnormality seen in the cervical spine. Electronically Signed   By: Marijo Conception M.D.   On: 12/27/2020 09:47   CT Cervical Spine Wo Contrast  Result Date: 12/27/2020 CLINICAL DATA:  Head laceration after fall. EXAM: CT HEAD WITHOUT CONTRAST CT CERVICAL SPINE WITHOUT CONTRAST TECHNIQUE: Multidetector CT imaging of the head and cervical spine was performed following the standard protocol without intravenous contrast. Multiplanar CT image reconstructions of the cervical spine were also  generated. COMPARISON:  December 12, 2020. FINDINGS: CT HEAD FINDINGS Brain: Mild chronic ischemic white matter disease is noted. No mass effect or midline shift is noted. Ventricular size is within normal limits. There is no evidence of mass lesion, hemorrhage or acute infarction. Vascular: No hyperdense vessel or unexpected calcification. Skull: Normal. Negative for fracture or focal lesion. Sinuses/Orbits: No acute finding. Other: Small left frontal scalp hematoma is noted. CT CERVICAL SPINE FINDINGS Alignment: Mild grade 1 retrolisthesis of C3-4 is noted secondary to severe degenerative disc disease at this level. Mild grade 1 retrolisthesis of C6-7 is also noted secondary to severe degenerative disc disease. Skull base and vertebrae: No acute fracture. No primary bone lesion or focal pathologic process. Soft tissues and spinal canal: No prevertebral fluid or swelling. No visible canal hematoma. Disc levels: Severe degenerative disc disease is noted at C3-4, C6-7 and C7-T1. Upper chest: Negative. Other: Degenerative changes seen involving the left-sided posterior facet joints. IMPRESSION: Small left frontal scalp hematoma. No acute intracranial abnormality seen. Severe multilevel degenerative disc disease. No acute abnormality seen in the cervical spine. Electronically Signed   By: Marijo Conception M.D.   On: 12/27/2020 09:47    ____________________________________________   PROCEDURES  Procedure(s) performed (including Critical Care):  .1-3 Lead EKG Interpretation  Date/Time: 12/27/2020 11:37 AM Performed by: Naaman Plummer, MD Authorized by: Naaman Plummer, MD     Interpretation: normal     ECG rate:  69   ECG rate assessment: normal     Rhythm: sinus rhythm     Ectopy: none     Conduction: normal     ____________________________________________   INITIAL IMPRESSION / ASSESSMENT AND PLAN / ED COURSE  As part of my medical decision making, I reviewed the following data within the  electronic medical record, if available:  Nursing notes reviewed and incorporated, Labs reviewed, EKG interpreted, Old chart reviewed, Radiograph reviewed and Notes from prior ED visits reviewed and incorporated  Patient presenting with head trauma.  Patient's neurological exam was non-focal and unremarkable.  [place] Head CT Rule was applied and patient did not fall into the low risk category so a head CT was obtained.  This showed no significant findings.  At this time, it is felt that the most likely explanation for the patient's symptoms is concussion.   I also considered SAH, SDH, Epidural Hematoma, IPH, skull fracture, migraine but this appears less likely considering the data gathered thus far.   Patient remained stable and neurologically intact while in the emergency department.  Discussed warning signs that would prompt return to ED.  Head trauma handout was provided.  Discussed in detail concussion management.  No sports or strenuous activity until symptoms free.  Return to emergency department urgently if new or worsening symptoms develop.    Impression:  Concussion Contusion to left eyebrow  Plan  Discharge from ED Tylenol for pain control. Avoid aspirin, NSAIDs, or other blood thinners. Advised patient on supportive measures for cognitive rest - avoid use of cognitive function for at least 24 hours.  This means no tv, books, texting, computers, etc. Limit visitors to the house.  Head trauma instructions provided in discharge instructions Instructed Pt to monitor for neurologic symptoms, severe HA, change in mental status, seizures, loss of conciousness. Instructed Pt to f/up w/ PCP in 3 days or ETC should symptoms worsen or not improve. Pt verbally expressed understanding and all questions were addressed to Pt's satisfaction.      ____________________________________________   FINAL CLINICAL IMPRESSION(S) / ED DIAGNOSES  Final diagnoses:  Injury of head, initial encounter   Contusion of face, initial encounter  Fall, initial encounter     ED Discharge Orders     None        Note:  This document was prepared using Dragon voice recognition software and may include unintentional dictation errors.    Naaman Plummer, MD 12/27/20 [phone]

## 2020-12-27 NOTE — Telephone Encounter (Signed)
Called and spoke with patients daughter, they are trying to get patient closer to home.  He is currently at [facility]  and they are wanting to transfer to [facility] in [place].   They need a FL2 filled out to get him transferred

## 2020-12-27 NOTE — Telephone Encounter (Signed)
Copied from [place] [id number]. Topic: General - Other >> Dec 27, 2020 10:04 AM Yvette Rack wrote: Reason for CRM: Pt daughter [name] stated she needs to speak with Dr. Wynetta Emery or her nurse regarding getting pt transferred to another facility. [name] was advised to contact Education officer, museum but she insisted on speaking with Dr. Wynetta Emery or Dr. Durenda Age nurse. Cb# [phone]

## 2020-12-28 ENCOUNTER — Telehealth: Payer: Self-pay

## 2020-12-28 NOTE — Telephone Encounter (Signed)
Placed in your folder to be finished.

## 2020-12-28 NOTE — Telephone Encounter (Signed)
Copied from [place] [id number]. Topic: General - Other >> Dec 28, 2020  4:24 PM Yvette Rack wrote: Reason for CRM: Pt daughter [name] stated she was told that Dr. Durenda Age nurse would be calling her back but she has not heard back from anyone. [name] requests call back at [phone]

## 2020-12-29 NOTE — Telephone Encounter (Signed)
Attempted to call daughter back, no answer left VM to call office back.

## 2020-12-29 NOTE — Telephone Encounter (Signed)
Returned patient daughter, [name], call. [name] would like an update on FL2 form. Advised her form is not yet complete, form is in provider folder awaiting signature. Rondel Baton we will call her when form has been faxed to [facility].

## 2020-12-29 NOTE — Telephone Encounter (Signed)
Todd [name] pt daughter is returning cristina call

## 2020-12-30 NOTE — Telephone Encounter (Signed)
Routing to provider. OK for DME to be written on a RX pad?

## 2020-12-30 NOTE — Telephone Encounter (Signed)
Daughter Ardeen Fillers calling to ask if Dr Wynetta Emery will get a Rx for a hospital bed.  They are going to bring him home to care for.  She states the bed can be obtained from this   [facility] [place] phone number [phone] Fax:  [phone]

## 2020-12-30 NOTE — Telephone Encounter (Signed)
Do they want me to get hospice involved? He would qualify

## 2020-12-31 DIAGNOSIS — E860 Dehydration: Secondary | ICD-10-CM | POA: Diagnosis not present

## 2020-12-31 DIAGNOSIS — R5382 Chronic fatigue, unspecified: Secondary | ICD-10-CM | POA: Diagnosis not present

## 2020-12-31 DIAGNOSIS — I959 Hypotension, unspecified: Secondary | ICD-10-CM | POA: Diagnosis not present

## 2020-12-31 DIAGNOSIS — A051 Botulism food poisoning: Secondary | ICD-10-CM | POA: Diagnosis not present

## 2021-01-03 ENCOUNTER — Encounter: Payer: Self-pay | Admitting: Nurse Practitioner

## 2021-01-03 ENCOUNTER — Non-Acute Institutional Stay: Payer: HMO | Admitting: Nurse Practitioner

## 2021-01-03 VITALS — BP 159/77 | HR 78 | Temp 97.2°F | Resp 18 | Wt 168.5 lb

## 2021-01-03 DIAGNOSIS — Z515 Encounter for palliative care: Secondary | ICD-10-CM

## 2021-01-03 DIAGNOSIS — R5381 Other malaise: Secondary | ICD-10-CM

## 2021-01-03 DIAGNOSIS — E861 Hypovolemia: Secondary | ICD-10-CM | POA: Diagnosis not present

## 2021-01-03 DIAGNOSIS — R4181 Age-related cognitive decline: Secondary | ICD-10-CM | POA: Diagnosis not present

## 2021-01-03 DIAGNOSIS — I959 Hypotension, unspecified: Secondary | ICD-10-CM | POA: Diagnosis not present

## 2021-01-03 DIAGNOSIS — R0602 Shortness of breath: Secondary | ICD-10-CM

## 2021-01-03 DIAGNOSIS — A051 Botulism food poisoning: Secondary | ICD-10-CM | POA: Diagnosis not present

## 2021-01-03 NOTE — Progress Notes (Signed)
[facility] Palliative Care Consult Note Telephone: [phone]  Fax: [phone]   Date of encounter: 01/03/21 PATIENT NAME: Ethanael Veith [address] [place] [address]   [phone] (home)  DOB: 07/13/1930 MRN: [id number] PRIMARY CARE PROVIDER:   Dr Lucianne Lei [facility] [address] [place], [place],  [place] [address] [phone]  RESPONSIBLE PARTY:    Contact Information     Name Relation Home Work Mobile   [place] Daughter [phone]  [phone]   [place], [place] Daughter [phone]   [name]    [phone]      I met face to face with patient and family in facility. Palliative Care was asked to follow this patient by consultation request of  Dr Nona Dell to address advance care planning and complex medical decision making. This is the initial visit.  ASSESSMENT AND PLAN / RECOMMENDATIONS:   Symptom Management/Plan: 1. Advance Care Planning; Full code, full scope of treatment  2. Debility secondary to late effects CVA; continue to encourage mobility, work with therapy, oob, fall precaution  3. Shortness of breath secondary to continue O2 continuous at baseline, continue inhalation therapy. Monitor respiratory status  4. Goals of Care: Goals include to maximize quality of life and symptom management. Our advance care planning conversation included a discussion about:    The value and importance of advance care planning  Exploration of personal, cultural or spiritual beliefs that might influence medical decisions  Exploration of goals of care in the event of a sudden injury or illness  Identification and preparation of a healthcare agent  Review and updating or creation of an advance directive document.  5. Palliative care encounter; Palliative care encounter; Palliative medicine team will continue to support patient, patient's family, and medical team. Visit consisted of  counseling and education dealing with the complex and emotionally intense issues of symptom management and palliative care in the setting of serious and potentially life-threatening illness  6. f/u 1 week for ongoing monitoring chronic disease progression, ongoing discussions complex medical decision making  Follow up Palliative Care Visit: Palliative care will continue to follow for complex medical decision making, advance care planning, and clarification of goals. Return 2 weeks or prn.  I spent 50 minutes providing this consultation. More than 50% of the time in this consultation was spent in counseling and care coordination.  PPS: 40%  Chief Complaint: Initial palliative consult for complex medical decision making  HISTORY OF PRESENT ILLNESS:  Kayle Correa is a 87 y.o. year old male  with multiple medical problems including prostate cancer, Afib, ischemic stroke, aphasic, dysphagia, CHF, O2 dependent, CAD, HTN, DM, GERD, DJD, anemia, CKD 3, HLD, b12 deficiency, anxiety, depression. Hospitalized 12/07/2020 to 12/16/2020 for  generalized weakness and atrial fibrillation.  The patient was cardioverted on 12/10/2020 into normal sinus rhythm by cardiology. Hospitalization complicated by right lower lobe PNA, completed Abx, AKI with hypomagnesemia with renal function improved. Right sided weakness with h/o ischemic stroke. Failure to thrive with deteriorating condition since 06/2020, sleeps around 18 hours a day. Left ventricular ejection fraction, by estimation, is 45 to 50%. Developed intermit agitation given haldol with mental status impaired. He was stabilized, d/c STR. 12/27/2020 ED visit for witnessed fall in which patient suffered a small contusion and laceration to the medial left eyebrow, d/c back to facility where he currently resides. Mr Resnik is currently residing at [facility]. Mr. Landress requires assistance with mobility, transfers to w/c  where he is able to sit up without assistance.  Mr. Mclees does require assistance with bathing, dressing. Mr. Linsey requires assistance with feeding, current Heart Healthy Diabetic diet, Level 4 - Pureed texture, Regular Liquids consistency. Mr. Osmun is on continuous O2. Mr. Selsor is a full code. At present Mr. Heeg if sitting in the w/c in his room with son Beckhem Isadore. We talked about purpose of PC visit, Mr. Guthrie in agreement. We talked about the last time Mr. Heidecker was independent at home as Mr Cen resides with his son Berneta Sages. We talked about the last time Mr. Moors walked and overall decline with progression of debility after CVA. We talked about past medical history. We talked about life review, social with family history. We talked about recent hospitalization. We talked about chronic disease progression. We talked about PT/OT at [facility]. We talked about goals with hopes of returning home. We talked about overall decline, debility. We talked about declined appetite with nutrition, pureed diet which is new for Mr. Hiegel. We talked about seeing how Mr. Spisak does through [facility] with ongoing discussions for d/c planning transition to LTC or home or  with [facility] or maybe eligible for Hospice if family wishes for comfort care focus, at present time full code with full scope of treatment. We talked about role PC in poc. Discussed will f/u 1 week to monitor progress. Therapeutic listening, emotional support provided. Questions answered. I updated staff.   History obtained from review of EMR, discussion with facility staff, Mr. Nagy son and  Mr. Meiners.  I reviewed available labs, medications, imaging, studies and related documents from the EMR.  Records reviewed and summarized above.   ROS Full 14 system review of systems performed and negative with exception of: as per HPI.   Physical Exam: Constitutional: NAD General: frail appearing,  debilitated, chronically ill, male EYES: lids intact ENMT: oral mucous membranes moist,  CV: S1S2, RRR, Pulmonary:  LCTA, no increased work of breathing, O2 Abdomen: soft and non tender MSK: w/c dependent Skin: warm and dry Neuro:  + generalized weakness,  + cognitive impairment Psych: flataffect, A and Oriented to self, plance  CURRENT PROBLEM LIST:  Patient Active Problem List   Diagnosis Date Noted   Anemia of chronic disease    Wheeze    Pneumonia of right lower lobe due to infectious organism    Acute kidney injury superimposed on CKD ([place])    Acute metabolic encephalopathy    Atrial fibrillation and flutter ([place]) 12/08/2020   Hypomagnesemia    Ischemic stroke (HCC)    Paroxysmal atrial fibrillation with rapid ventricular response ([place]) 12/07/2020   Lower urinary tract symptoms (LUTS) 05/19/2020   Atrial fibrillation with rapid ventricular response ([place]) 11/11/2018   Frequent PVCs    Anemia due to stage 3 chronic kidney disease ([place]) 09/16/2018   Normocytic anemia 05/17/2018   Hemiplegia ([place]) 04/25/2018   B12 deficiency 04/25/2018   Dysphagia as late effect of cerebrovascular accident (CVA) 04/25/2018   Depression, major, single episode, severe ([place]) 04/25/2018   Weakness 04/20/2018   CHF (congestive heart failure) ([place]) 01/09/2018   Advanced care planning/counseling discussion 12/20/2017   Hypoxia 11/02/2017   Mixed hyperlipidemia 09/21/2017   Aphasia 05/18/2017   CAD (coronary artery disease) 05/18/2017   History of stroke with residual deficit 05/18/2017   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 11/09/2015   Knee joint replaced by other means 07/04/2015   Urinary incontinence 04/27/2015   Anxiety, generalized 04/07/2015   History  of BCG vaccination 04/07/2015   GERD (gastroesophageal reflux disease) 03/04/2015   Lethargy 03/01/2015   Anxiety and depression 02/25/2015   Degeneration of intervertebral disc of lumbar region 02/23/2015   Neuritis or radiculitis due to rupture of lumbar intervertebral disc 02/23/2015   Lumbar stenosis with neurogenic claudication 02/23/2015    Hypertension 12/02/2014   Type 2 diabetes mellitus with hyperlipidemia ([place]) 12/02/2014   Benign essential tremor 03/13/2014   Imbalance 03/13/2014   Prostate cancer ([place]) 10/07/2012   ED (erectile dysfunction) of organic origin 02/09/2012   PAST MEDICAL HISTORY:  Active Ambulatory Problems    Diagnosis Date Noted   Hypertension 12/02/2014   Type 2 diabetes mellitus with hyperlipidemia ([place]) 12/02/2014   Anxiety and depression 02/25/2015   Lethargy 03/01/2015   GERD (gastroesophageal reflux disease) 03/04/2015   Anxiety, generalized 04/07/2015   Benign essential tremor 03/13/2014   Degeneration of intervertebral disc of lumbar region 02/23/2015   History of BCG vaccination 04/07/2015   Imbalance 03/13/2014   ED (erectile dysfunction) of organic origin 02/09/2012   Neuritis or radiculitis due to rupture of lumbar intervertebral disc 02/23/2015   Lumbar stenosis with neurogenic claudication 02/23/2015   Prostate cancer ([place]) 10/07/2012   Urinary incontinence 04/27/2015   Knee joint replaced by other means 07/04/2015   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 11/09/2015   Aphasia 05/18/2017   CAD (coronary artery disease) 05/18/2017   History of stroke with residual deficit 05/18/2017   Mixed hyperlipidemia 09/21/2017   Hypoxia 11/02/2017   Advanced care planning/counseling discussion 12/20/2017   CHF (congestive heart failure) ([place]) 01/09/2018   Weakness 04/20/2018   Hemiplegia ([place]) 04/25/2018   B12 deficiency 04/25/2018   Dysphagia as late effect of cerebrovascular accident (CVA) 04/25/2018   Depression, major, single episode, severe ([place]) 04/25/2018   Normocytic anemia 05/17/2018   Anemia due to stage 3 chronic kidney disease ([place]) 09/16/2018   Atrial fibrillation with rapid ventricular response ([place]) 11/11/2018   Frequent PVCs    Lower urinary tract symptoms (LUTS) 05/19/2020   Paroxysmal atrial fibrillation with rapid ventricular response ([place]) 12/07/2020   Atrial  fibrillation and flutter ([place]) 12/08/2020   Hypomagnesemia    Ischemic stroke ([place])    Pneumonia of right lower lobe due to infectious organism    Acute kidney injury superimposed on CKD ([place])    Acute metabolic encephalopathy    Wheeze    Anemia of chronic disease    Resolved Ambulatory Problems    Diagnosis Date Noted   Anxiety 03/04/2015   DOE (dyspnea on exertion) 03/04/2015   Accelerated hypertension 03/04/2015   Mild major depression ([place]) 04/07/2015   H/O diabetes mellitus 04/07/2015   H/O: glaucoma 04/07/2015   H/O hypercholesterolemia 04/07/2015   CA of prostate ([place]) 10/07/2012   Chest pain 03/17/2017   Acute respiratory failure ([place]) 04/22/2018   Past Medical History:  Diagnosis Date   Aortic regurgitation    Arthritis    Depression    Diabetes mellitus without complication (HCC)    Diastolic dysfunction    ED (erectile dysfunction)    Elevated troponin    Glaucoma    Hyperlipidemia    Iron deficiency anemia due to chronic blood loss    Left pontine stroke w/ cerebrovascular disease(HCC)    Lumbago    SOCIAL HX:  Social History   Tobacco Use   Smoking status: Former    Types: Cigarettes    Quit date: 12/02/1994    Years since quitting: 26.1   Smokeless tobacco: Never  Substance Use Topics   Alcohol use: Yes    Alcohol/week: 1.0 standard drink    Types: 1 Cans of beer per week    Comment: occasional   FAMILY HX:  Family History  Problem Relation Age of Onset   Stroke Mother    Hypertension Mother    Hypertension Father    Stroke Sister    Prostate cancer Neg Hx    Kidney cancer Neg Hx    Bladder Cancer Neg Hx     reviewed  ALLERGIES:  Allergies  Allergen Reactions   Amlodipine     Lower extremity swelling at 10 mg.   Fluoxetine Other (See Comments)    headache   Meloxicam Other (See Comments)    Other reaction(s): Dizziness Nervousness.      Questions and concerns were addressed. The patient/family was encouraged to call with  questions and/or concerns. My contact information was provided. Provided general support and encouragement, no other unmet needs identified   Thank you for the opportunity to participate in the care of Mr. Camilli.  The palliative care team will continue to follow. Please call our office at [phone] if we can be of additional assistance.   This chart was dictated using voice recognition software.  Despite best efforts to proofread,  errors can occur which can change the documentation meaning.   Mac Dowdell Z Vanna Sailer, NP ,

## 2021-01-04 ENCOUNTER — Encounter: Payer: Self-pay | Admitting: Internal Medicine

## 2021-01-04 ENCOUNTER — Other Ambulatory Visit: Payer: Self-pay

## 2021-01-07 ENCOUNTER — Telehealth: Payer: Self-pay

## 2021-01-07 NOTE — Telephone Encounter (Signed)
Called and LVM asking for patient's daughter to please return my call to discuss her concerns.

## 2021-01-07 NOTE — Telephone Encounter (Signed)
Copied from [place] [id number]. Topic: General - Other >> Jan 06, 2021  9:57 AM Celene Kras wrote: Reason for CRM: [name], pts daughter in law, calling and is requesting to speak with PCP regarding a DNR form. She is requesting to have a call back. Please advise.

## 2021-01-10 NOTE — Telephone Encounter (Signed)
Called patient to discuss lab results, no answer, left a voicemail for patient to return my call.  ?  ? ? ?

## 2021-01-11 DIAGNOSIS — G9341 Metabolic encephalopathy: Secondary | ICD-10-CM | POA: Diagnosis not present

## 2021-01-12 NOTE — Telephone Encounter (Signed)
Tried calling patient's daughter in law, no answer and no VM. Asked for her to please call us back if she still have questions or needs our assistance.

## 2021-01-13 DIAGNOSIS — Z515 Encounter for palliative care: Secondary | ICD-10-CM | POA: Diagnosis not present

## 2021-01-13 DIAGNOSIS — R627 Adult failure to thrive: Secondary | ICD-10-CM | POA: Diagnosis not present

## 2021-01-13 DIAGNOSIS — R1312 Dysphagia, oropharyngeal phase: Secondary | ICD-10-CM | POA: Diagnosis not present

## 2021-01-14 ENCOUNTER — Telehealth: Payer: Self-pay | Admitting: Pharmacy Technician

## 2021-01-14 DIAGNOSIS — Z596 Low income: Secondary | ICD-10-CM

## 2021-01-14 NOTE — Progress Notes (Addendum)
Medication Assistance Referral  Referral From: [facility]  Medication/Company: Eliquis / BMS Patient application portion:  Mailed Provider application portion: Faxed  to Dr. Park Liter Provider address/fax verified via: Office website  Medication/Company: Vania Rea / [employer] Patient application portion:  Mailed Provider application portion: Faxed  to Dr. Park Liter Provider address/fax verified via: Office website  ADDENDUM 01/28/21  Unsuccessful outgoing call placed to patient in regards to above medication assistance referral. HIPAA compliant voicemail left informing patient to take patient assistance applications to the office for embedded [employer] [facility] Rear as patient is enrolled in the CCM program. Case is being closed by [facility]   Dshaun Reppucci P. Colson Barco, [place]  [phone]

## 2021-01-21 DIAGNOSIS — I10 Essential (primary) hypertension: Secondary | ICD-10-CM | POA: Diagnosis not present

## 2021-01-24 DIAGNOSIS — G20 Parkinson's disease: Secondary | ICD-10-CM | POA: Diagnosis not present

## 2021-01-24 DIAGNOSIS — U071 COVID-19: Secondary | ICD-10-CM | POA: Diagnosis not present

## 2021-01-24 DIAGNOSIS — R1312 Dysphagia, oropharyngeal phase: Secondary | ICD-10-CM | POA: Diagnosis not present

## 2021-01-24 DIAGNOSIS — R627 Adult failure to thrive: Secondary | ICD-10-CM | POA: Diagnosis not present

## 2021-01-26 ENCOUNTER — Emergency Department

## 2021-01-26 ENCOUNTER — Emergency Department
Admission: EM | Admit: 2021-01-26 | Discharge: 2021-01-26 | Disposition: A | Discharge status: Home / Self Care | Attending: Emergency Medicine | Admitting: Emergency Medicine

## 2021-01-26 ENCOUNTER — Telehealth: Payer: Self-pay

## 2021-01-26 DIAGNOSIS — Z7401 Bed confinement status: Secondary | ICD-10-CM | POA: Diagnosis not present

## 2021-01-26 DIAGNOSIS — U071 COVID-19: Secondary | ICD-10-CM | POA: Insufficient documentation

## 2021-01-26 DIAGNOSIS — I130 Hypertensive heart and chronic kidney disease with heart failure and stage 1 through stage 4 chronic kidney disease, or unspecified chronic kidney disease: Secondary | ICD-10-CM | POA: Insufficient documentation

## 2021-01-26 DIAGNOSIS — Z7901 Long term (current) use of anticoagulants: Secondary | ICD-10-CM | POA: Diagnosis not present

## 2021-01-26 DIAGNOSIS — Z87891 Personal history of nicotine dependence: Secondary | ICD-10-CM | POA: Diagnosis not present

## 2021-01-26 DIAGNOSIS — I10 Essential (primary) hypertension: Secondary | ICD-10-CM | POA: Diagnosis not present

## 2021-01-26 DIAGNOSIS — N183 Chronic kidney disease, stage 3 unspecified: Secondary | ICD-10-CM | POA: Diagnosis not present

## 2021-01-26 DIAGNOSIS — I509 Heart failure, unspecified: Secondary | ICD-10-CM | POA: Insufficient documentation

## 2021-01-26 DIAGNOSIS — Z8546 Personal history of malignant neoplasm of prostate: Secondary | ICD-10-CM | POA: Diagnosis not present

## 2021-01-26 DIAGNOSIS — Z79899 Other long term (current) drug therapy: Secondary | ICD-10-CM | POA: Insufficient documentation

## 2021-01-26 DIAGNOSIS — Z7984 Long term (current) use of oral hypoglycemic drugs: Secondary | ICD-10-CM | POA: Diagnosis not present

## 2021-01-26 DIAGNOSIS — E1122 Type 2 diabetes mellitus with diabetic chronic kidney disease: Secondary | ICD-10-CM | POA: Diagnosis not present

## 2021-01-26 DIAGNOSIS — R059 Cough, unspecified: Secondary | ICD-10-CM | POA: Diagnosis not present

## 2021-01-26 DIAGNOSIS — Z209 Contact with and (suspected) exposure to unspecified communicable disease: Secondary | ICD-10-CM | POA: Diagnosis not present

## 2021-01-26 DIAGNOSIS — Z7982 Long term (current) use of aspirin: Secondary | ICD-10-CM | POA: Diagnosis not present

## 2021-01-26 DIAGNOSIS — Z96653 Presence of artificial knee joint, bilateral: Secondary | ICD-10-CM | POA: Diagnosis not present

## 2021-01-26 DIAGNOSIS — I2510 Atherosclerotic heart disease of native coronary artery without angina pectoris: Secondary | ICD-10-CM | POA: Diagnosis not present

## 2021-01-26 DIAGNOSIS — I959 Hypotension, unspecified: Secondary | ICD-10-CM | POA: Diagnosis not present

## 2021-01-26 DIAGNOSIS — Z8673 Personal history of transient ischemic attack (TIA), and cerebral infarction without residual deficits: Secondary | ICD-10-CM | POA: Insufficient documentation

## 2021-01-26 DIAGNOSIS — D631 Anemia in chronic kidney disease: Secondary | ICD-10-CM | POA: Diagnosis not present

## 2021-01-26 DIAGNOSIS — R404 Transient alteration of awareness: Secondary | ICD-10-CM | POA: Diagnosis not present

## 2021-01-26 DIAGNOSIS — R531 Weakness: Secondary | ICD-10-CM | POA: Diagnosis not present

## 2021-01-26 LAB — CBC WITH DIFFERENTIAL/PLATELET
Abs Immature Granulocytes: 0.07 10*3/uL (ref 0.00–0.07)
Basophils Absolute: 0 10*3/uL (ref 0.0–0.1)
Basophils Relative: 1 %
Eosinophils Absolute: 0 10*3/uL (ref 0.0–0.5)
Eosinophils Relative: 0 %
HCT: 39.5 % (ref 39.0–52.0)
Hemoglobin: 12.1 g/dL — ABNORMAL LOW (ref 13.0–17.0)
Immature Granulocytes: 1 %
Lymphocytes Relative: 11 %
Lymphs Abs: 1 10*3/uL (ref 0.7–4.0)
MCH: 30.4 pg (ref 26.0–34.0)
MCHC: 30.6 g/dL (ref 30.0–36.0)
MCV: 99.2 fL (ref 80.0–100.0)
Monocytes Absolute: 1.1 10*3/uL — ABNORMAL HIGH (ref 0.1–1.0)
Monocytes Relative: 12 %
Neutro Abs: 6.6 10*3/uL (ref 1.7–7.7)
Neutrophils Relative %: 75 %
Platelets: 269 10*3/uL (ref 150–400)
RBC: 3.98 MIL/uL — ABNORMAL LOW (ref 4.22–5.81)
RDW: 13.5 % (ref 11.5–15.5)
WBC: 8.8 10*3/uL (ref 4.0–10.5)
nRBC: 0 % (ref 0.0–0.2)

## 2021-01-26 LAB — BASIC METABOLIC PANEL
Anion gap: 11 (ref 5–15)
BUN: 40 mg/dL — ABNORMAL HIGH (ref 8–23)
CO2: 24 mmol/L (ref 22–32)
Calcium: 9.1 mg/dL (ref 8.9–10.3)
Chloride: 107 mmol/L (ref 98–111)
Creatinine, Ser: 1.47 mg/dL — ABNORMAL HIGH (ref 0.61–1.24)
GFR, Estimated: 46 mL/min — ABNORMAL LOW (ref >60)
Glucose, Bld: 124 mg/dL — ABNORMAL HIGH (ref 70–99)
Potassium: 4.8 mmol/L (ref 3.5–5.1)
Sodium: 142 mmol/L (ref 135–145)

## 2021-01-26 MED ORDER — GUAIFENESIN 200 MG/5ML PO LIQD: 200.0000 mg | Freq: Four times a day (QID) | ORAL | 0 refills | Status: AC | PRN

## 2021-01-26 MED ORDER — GUAIFENESIN 100 MG/5ML PO SOLN
10.0000 mL | Freq: Once | ORAL | Status: DC
  Filled 2021-01-26: qty 10

## 2021-01-26 MED ORDER — SODIUM CHLORIDE 0.9 % IV BOLUS
1000.0000 mL | Freq: Once | INTRAVENOUS | Status: AC
  Administered 2021-01-26: 1000 mL via INTRAVENOUS

## 2021-01-26 MED ORDER — AZITHROMYCIN 200 MG/5ML PO SUSR: 250.0000 mg | Freq: Every day | ORAL | 0 refills | Status: AC

## 2021-01-26 NOTE — ED Provider Notes (Signed)
[facility] Emergency Department Provider Note   ____________________________________________   Event Date/Time   First MD Initiated Contact with Patient 01/26/21 1430     (approximate)  I have reviewed the triage vital signs and the nursing notes.   HISTORY  Chief Complaint Failure To Thrive    HPI Galo Sayed is a 87 y.o. male with past medical history of hypertension, hyperlipidemia, diabetes, prostate cancer, Parkinson disease, atrial fibrillation on Eliquis, and stroke with right-sided deficits who presents to the ED for cough.  History is limited due to patient's history of stroke and resulting aphasia.  Per daughter, patient has been dealing with a wet cough for the past few days, had initially tested positive for COVID-19 6 days ago.  He has not had any fevers or complained of any pain.  Daughter is requesting something to help with his cough and is concerned that he could have aspirated or developed a pneumonia.  [facility] currently follows with [facility] and wears 4 L nasal cannula at baseline.        Past Medical History:  Diagnosis Date   Anxiety    Aortic regurgitation    a. 03/2017 Echo: Mod AI; b. 05/2017 Echo: Triv AI.   Arthritis    osteoarthritis-left knee   CAD (coronary artery disease)    a. Reported h/o cath ~ 2008 - ? small vessel dzs. No PCI performed; b. 03/2017 MV: EF 45-54%, no ischemia/infarct.   Depression    Diabetes mellitus without complication ([place])    Diastolic dysfunction    a. 03/2017 Echo: EF 55-65%; b. 05/2017 Echo: EF 55-60%, no rwma, Gr1 DD. Triv AI.  Mildly dil LA.   ED (erectile dysfunction)    Elevated troponin    a. chronic mild elevation - 0.03->0.04.   GERD (gastroesophageal reflux disease)    Glaucoma    Hyperlipidemia    Hypertension    Iron deficiency anemia due to chronic blood loss    Left pontine stroke w/ cerebrovascular disease(HCC)    a. 05/2017 MRI/A: acute to subacute L pontine  infarct. Chronic left thalamic lacunar infarct. Severe basilar artery stenosis w/ radiographic string sign of the distal 1/3. Mild to moderate L PCA P2 segment stenosis; b. 05/2017 Carotid U/S: <50% bilat ICA stenosis.   Lumbago    Lumbosacral Neuritis   Prostate cancer [facility])     Patient Active Problem List   Diagnosis Date Noted   Anemia of chronic disease    Wheeze    Pneumonia of right lower lobe due to infectious organism    Acute kidney injury superimposed on CKD ([place])    Acute metabolic encephalopathy    Atrial fibrillation and flutter ([place]) 12/08/2020   Hypomagnesemia    Ischemic stroke (HCC)    Paroxysmal atrial fibrillation with rapid ventricular response ([place]) 12/07/2020   Lower urinary tract symptoms (LUTS) 05/19/2020   Atrial fibrillation with rapid ventricular response ([place]) 11/11/2018   Frequent PVCs    Anemia due to stage 3 chronic kidney disease ([place]) 09/16/2018   Normocytic anemia 05/17/2018   Hemiplegia ([place]) 04/25/2018   B12 deficiency 04/25/2018   Dysphagia as late effect of cerebrovascular accident (CVA) 04/25/2018   Depression, major, single episode, severe ([place]) 04/25/2018   Weakness 04/20/2018   CHF (congestive heart failure) ([place]) 01/09/2018   Advanced care planning/counseling discussion 12/20/2017   Hypoxia 11/02/2017   Mixed hyperlipidemia 09/21/2017   Aphasia 05/18/2017   CAD (coronary artery disease) 05/18/2017   History of stroke  with residual deficit 05/18/2017   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 11/09/2015   Knee joint replaced by other means 07/04/2015   Urinary incontinence 04/27/2015   Anxiety, generalized 04/07/2015   History of BCG vaccination 04/07/2015   GERD (gastroesophageal reflux disease) 03/04/2015   Lethargy 03/01/2015   Anxiety and depression 02/25/2015   Degeneration of intervertebral disc of lumbar region 02/23/2015   Neuritis or radiculitis due to rupture of lumbar intervertebral disc 02/23/2015   Lumbar stenosis with  neurogenic claudication 02/23/2015   Hypertension 12/02/2014   Type 2 diabetes mellitus with hyperlipidemia ([place]) 12/02/2014   Benign essential tremor 03/13/2014   Imbalance 03/13/2014   Prostate cancer ([place]) 10/07/2012   ED (erectile dysfunction) of organic origin 02/09/2012    Past Surgical History:  Procedure Laterality Date   CARDIOVERSION N/A 12/10/2020   Procedure: CARDIOVERSION;  Surgeon: Nelva Bush, MD;  Location: [facility] ORS;  Service: Cardiovascular;  Laterality: N/A;   CARPAL [place]      Prior to Admission medications   Medication Sig Start Date End Date Taking? Authorizing Provider  acetaminophen (TYLENOL) 650 MG CR tablet Take 650 mg by mouth every 8 (eight) hours as needed for pain.    [provider]  amiodarone (PACERONE) 200 MG tablet Take 1 tablet (200 mg total) by mouth daily. 12/17/20   Sharen Hones, MD  apixaban (ELIQUIS) 5 MG TABS tablet Take 1 tablet (5 mg total) by mouth 2 (two) times daily. 11/29/20   Park Liter P, DO  aspirin EC 81 MG tablet Take 81 mg by mouth daily. Swallow whole.    [provider]  atorvastatin (LIPITOR) 20 MG tablet Take 1 tablet (20 mg total) by mouth daily. 11/29/20   Park Liter P, DO  blood glucose meter kit and supplies KIT Dispense based on patient and insurance preference. Use up to four times daily as directed. (FOR ICD-9 250.00, 250.01). 11/17/18   Dustin Flock, MD  Blood Glucose Monitoring Suppl (ACCU-CHEK AVIVA PLUS) w/Device KIT Use to check sugar levels x 2 daily 11/17/18   Dustin Flock, MD  carbidopa-levodopa (SINEMET IR) 25-100 MG tablet Take 1 tablet by mouth 3 (three) times daily. 09/22/20   [provider]  docusate sodium (COLACE) 100 MG capsule Take 1 capsule (100 mg total) by mouth 2 (two) times daily as needed for mild constipation. 11/29/20   Johnson, Megan P, DO  DULoxetine (CYMBALTA) 30 MG capsule Take 1 capsule (30 mg total) by mouth  daily. 11/29/20   Park Liter P, DO  empagliflozin (JARDIANCE) 25 MG TABS tablet Take 1 tablet (25 mg total) by mouth daily before breakfast. 11/29/20   Park Liter P, DO  gabapentin (NEURONTIN) 100 MG capsule Take 1 capsule (100 mg total) by mouth at bedtime. 11/29/20   Johnson, Megan P, DO  isosorbide-hydrALAZINE (BIDIL) 20-37.5 MG tablet Take 1 tablet by mouth 3 (three) times daily. 12/16/20   Sharen Hones, MD  Lancets (ACCU-CHEK SOFT [facility]) lancets Use as instructed 11/17/18   Dustin Flock, MD  latanoprost (XALATAN) 0.005 % ophthalmic solution Place 1 drop into both eyes at bedtime.  06/08/16   [provider]  Magnesium Oxide 400 (240 Mg) MG TABS Take 1 tablet (400 mg total) by mouth 2 (two) times daily. 07/01/19   Rise Mu, PA-C  metFORMIN (GLUCOPHAGE) 500 MG tablet Take 1 tablet (500 mg total) by mouth 2 (two) times daily with a meal. 11/29/20   Park Liter  P, DO  metoprolol succinate (TOPROL-XL) 25 MG 24 hr tablet Take 1 tablet (25 mg total) by mouth daily. 12/17/20   Sharen Hones, MD  [facility] ULTRA test strip USE TO CHECK SUGAR LEVELS TWICE DAILY. 11/18/18   Guadalupe Maple, MD  pantoprazole (PROTONIX) 40 MG tablet Take 1 tablet (40 mg total) by mouth daily. 11/29/20   Park Liter P, DO  Polyethylene Glycol 3350 (MIRALAX PO) Take 17 g by mouth as needed.    [provider]  tamsulosin (FLOMAX) 0.4 MG CAPS capsule Take 1 capsule (0.4 mg total) by mouth daily. 05/31/20   Park Liter P, DO    Allergies Amlodipine, Fluoxetine, and Meloxicam  Family History  Problem Relation Age of Onset   Stroke Mother    Hypertension Mother    Hypertension Father    Stroke Sister    Prostate cancer Neg Hx    Kidney cancer Neg Hx    Bladder Cancer Neg Hx     Social History Social History   Tobacco Use   Smoking status: Former    Types: Cigarettes    Quit date: 12/02/1994    Years since quitting: 26.1   Smokeless tobacco: Never  Vaping Use   Vaping Use: Never  used  Substance Use Topics   Alcohol use: Yes    Alcohol/week: 1.0 standard drink    Types: 1 Cans of beer per week    Comment: occasional   Drug use: No    Review of Systems Unable to obtain secondary to history of stroke and aphasia  ____________________________________________   PHYSICAL EXAM:  VITAL SIGNS: ED Triage Vitals  Enc Vitals Group     BP 01/26/21 1429 123/69     Pulse Rate 01/26/21 1429 97     Resp 01/26/21 1429 16     Temp 01/26/21 1429 98.3 F (36.8 C)     Temp Source 01/26/21 1429 Oral     SpO2 01/26/21 1429 92 %     Weight 01/26/21 1433 156 lb 1.4 oz (70.8 kg)     Height --      Head Circumference --      Peak Flow --      Pain Score --      Pain Loc --      Pain Edu? --      Excl. in [place]? --     Constitutional: Awake and alert. Eyes: Conjunctivae are normal. Head: Atraumatic. Nose: No congestion/rhinnorhea. Mouth/Throat: Mucous membranes are moist. Neck: Normal ROM Cardiovascular: Normal rate, regular rhythm. Grossly normal heart sounds.  2+ radial pulses bilaterally. Respiratory: Normal respiratory effort.  No retractions. Lungs with rales to bilateral bases. Gastrointestinal: Soft and nontender. No distention. Genitourinary: deferred Musculoskeletal: No lower extremity tenderness nor edema. Neurologic: Aphasia noted, chronic per daughter.  Right-sided hemiparesis, at patient's reported baseline.. Skin:  Skin is warm, dry and intact. No rash noted. Psychiatric: Unable to assess.  ____________________________________________   LABS (all labs ordered are listed, but only abnormal results are displayed)  Labs Reviewed  CBC WITH DIFFERENTIAL/PLATELET - Abnormal; Notable for the following components:      Result Value   RBC 3.98 (*)    Hemoglobin 12.1 (*)    Monocytes Absolute 1.1 (*)    All other components within normal limits  BASIC METABOLIC PANEL   ____________________________________________  EKG  ED ECG REPORT I, Blake Divine, the attending physician, personally viewed and interpreted this ECG.   Date: 01/26/2021  EKG Time: 14:31  Rate: 94  Rhythm: normal sinus rhythm  Axis: LAD  Intervals:left bundle branch block  ST&T Change: None, negative sgarbossa   PROCEDURES  Procedure(s) performed (including Critical Care):  Procedures   ____________________________________________   INITIAL IMPRESSION / ASSESSMENT AND PLAN / ED COURSE      87 year old male with past medical history of hypertension, hyperlipidemia, diabetes, stroke with right hemiparesis, atrial fibrillation on Eliquis, and Parkinson disease who presents to the ED with ongoing wet cough after recent diagnosis of COVID-19.  He does have a wet cough on arrival but is not in any respiratory distress, maintaining O2 sats on his usual 4 L nasal cannula.  I spoke with daughter at length about patient's overall prognosis given I am concerned that he is dealing with recurrent aspiration.  She states that her overall goal is to have patient return home with home hospice, but for now she would like to have chest x-ray and basic labs performed to determine if patient has developed a pneumonia.  Plan was discussed with representative from [facility], who agrees that following ED evaluation, we will tentatively plan on discharge back to [facility] with symptomatic management of his cough as well as potential course of antibiotics.  Patient turned over to oncoming provider pending labs and chest x-ray results.      ____________________________________________   FINAL CLINICAL IMPRESSION(S) / ED DIAGNOSES  Final diagnoses:  Cough  History of stroke     ED Discharge Orders     None        Note:  This document was prepared using Dragon voice recognition software and may include unintentional dictation errors.    Blake Divine, MD 01/26/21 1550

## 2021-01-26 NOTE — ED Provider Notes (Signed)
-----------------------------------------   6:06 PM on 01/26/2021 -----------------------------------------  I have seen the patient.  Patient care assumed from Dr. Charna Archer.  Overall patient appears stable, vitals are reassuring.  Chest x-ray is clear lab work is overall reassuring.  We will IV hydrate while in the emergency department.  Patient does have a wet sounding cough we will dose oral liquid Mucinex for the patient.  Daughter is concerned over possible aspiration given patient has difficulty swallowing and has significant secretions.  I believe it would be reasonable to cover the patient with antibiotics as a precaution.  I sent a message to the social worker to see if there is anything we can do to help them arrange getting a hospital bed at home so they can bring the patient home eventually from [facility].  Patient will be discharged back to his nursing facility after fluids have infused.   Harvest Dark, MD 01/26/21 1807

## 2021-01-26 NOTE — ED Notes (Signed)
[place] to give report. No answer and no option to leave a message. Will try again in 15 minutes

## 2021-01-26 NOTE — Progress Notes (Signed)
    Chronic Care Management Pharmacy Assistant   Name: Todd [name]  MRN: [id number] DOB: [birth year]   Reason for Encounter: Status on Patient Assistance Application of Eliquis     Medications: Outpatient Encounter Medications as of 01/26/2021  Medication Sig Note   acetaminophen (TYLENOL) 650 MG CR tablet Take 650 mg by mouth every 8 (eight) hours as needed for pain.    amiodarone (PACERONE) 200 MG tablet Take 1 tablet (200 mg total) by mouth daily.    apixaban (ELIQUIS) 5 MG TABS tablet Take 1 tablet (5 mg total) by mouth 2 (two) times daily. 12/08/2020: Pt daughter knows he took morning dose but not sure if took evening dose.   aspirin EC 81 MG tablet Take 81 mg by mouth daily. Swallow whole.    atorvastatin (LIPITOR) 20 MG tablet Take 1 tablet (20 mg total) by mouth daily.    blood glucose meter kit and supplies KIT Dispense based on patient and insurance preference. Use up to four times daily as directed. (FOR ICD-9 250.00, 250.01).    Blood Glucose Monitoring Suppl (ACCU-CHEK AVIVA PLUS) w/Device KIT Use to check sugar levels x 2 daily    carbidopa-levodopa (SINEMET IR) 25-100 MG tablet Take 1 tablet by mouth 3 (three) times daily.    docusate sodium (COLACE) 100 MG capsule Take 1 capsule (100 mg total) by mouth 2 (two) times daily as needed for mild constipation.    DULoxetine (CYMBALTA) 30 MG capsule Take 1 capsule (30 mg total) by mouth daily.    empagliflozin (JARDIANCE) 25 MG TABS tablet Take 1 tablet (25 mg total) by mouth daily before breakfast.    gabapentin (NEURONTIN) 100 MG capsule Take 1 capsule (100 mg total) by mouth at bedtime.    isosorbide-hydrALAZINE (BIDIL) 20-37.5 MG tablet Take 1 tablet by mouth 3 (three) times daily.    Lancets (ACCU-CHEK SOFT TOUCH) lancets Use as instructed    latanoprost (XALATAN) 0.005 % ophthalmic solution Place 1 drop into both eyes at bedtime.     Magnesium Oxide 400 (240 Mg) MG TABS Take 1 tablet (400 mg total) by mouth 2 (two) times daily.     metFORMIN (GLUCOPHAGE) 500 MG tablet Take 1 tablet (500 mg total) by mouth 2 (two) times daily with a meal.    metoprolol succinate (TOPROL-XL) 25 MG 24 hr tablet Take 1 tablet (25 mg total) by mouth daily.    ONETOUCH ULTRA test strip USE TO CHECK SUGAR LEVELS TWICE DAILY.    pantoprazole (PROTONIX) 40 MG tablet Take 1 tablet (40 mg total) by mouth daily.    Polyethylene Glycol 3350 (MIRALAX PO) Take 17 g by mouth as needed.    tamsulosin (FLOMAX) 0.4 MG CAPS capsule Take 1 capsule (0.4 mg total) by mouth daily.    No facility-administered encounter medications on file as of 01/26/2021.   Checking the statues on the Eliquis patient assistance application states Manufactuer states there is a missing signature on the form by the patient. Patient daughter answered and states that  she is not sure. Patient daughter states that the patient does not want to take his medications and feels like he is giving up. He is also not able to eat. Patients daughter states she will call us back once the patient feels up to it.  Corrie Mckusick, [place]

## 2021-01-26 NOTE — ED Notes (Signed)
Patient awaiting transport to go back to [facility], daughter remains at the bedside

## 2021-01-26 NOTE — ED Notes (Signed)
Care transferred, report received from [place], [place]

## 2021-01-26 NOTE — ED Triage Notes (Signed)
Pt arrives via EMS from [facility] due to failure to thrive. Per EMS pt has not been eating or drinking for the past two weeks. Pt has a dx of COVID but is almost out of the window. Pt is on hospice care.

## 2021-01-26 NOTE — Progress Notes (Signed)
[facility] ED 17- [facility])       This patient is a current hospice patient with [facility], admitted with a terminal diagnosis of hypertensive heart disease.     [facility] will continue to follow for any discharge planning needs and to coordinate continuation of hospice care.   Please don't hesitate to call with any Hospice related questions or concerns.    Thank you for the opportunity to participate in this patient's care. [name], Therapist, sports, BSN, [facility], [facility] Liaison [phone]

## 2021-01-27 ENCOUNTER — Telehealth: Payer: Self-pay | Admitting: Family Medicine

## 2021-01-27 NOTE — Telephone Encounter (Signed)
Todd [name] with patient assistant program is calling and she is missing the patient demographic and consent for eliquis medication

## 2021-01-27 NOTE — Telephone Encounter (Signed)
Routing to PharmD who does patient assistance.

## 2021-01-31 ENCOUNTER — Telehealth: Payer: Self-pay

## 2021-01-31 MED ORDER — LORAZEPAM 2 MG/ML IJ SOLN: 0.2500 mL | INTRAMUSCULAR | 3 refills | Status: AC | PRN

## 2021-01-31 MED ORDER — MORPHINE SULFATE (CONCENTRATE) 20 MG/ML PO SOLN: 5.0000 mg | ORAL | 0 refills | Status: AC | PRN

## 2021-02-01 ENCOUNTER — Telehealth: Payer: Self-pay

## 2021-02-01 NOTE — Telephone Encounter (Signed)
Copied from [place] [id number]. Topic: General - Other >> Feb 01, 2021  9:26 AM Leward Quan A wrote: Reason for CRM: Patient daughter Horton Rhodes called in stated that it is urgent that she speak to Dr Wynetta Emery or her nurse. Say that patient passed away last night no other information just that she need a call back please ASAP. Call Ph# [phone]   Called and spoke to [place]. She states that they got her father home yesterday and that he passed away last night. Apologized to [facility] for their loss and advised her to please let us know if their is anything they need. [name] wanted to let us know that if we need to call them for any information that we could.   Routing to Dr. Wynetta Emery to notify her.

## 2021-02-01 NOTE — Telephone Encounter (Signed)
Thank you. Let me know if they need me to fill out the death certificate. I have not gotten an alert about it through [place] yet

## 2021-02-03 NOTE — Chronic Care Management (AMB) (Signed)
    Chronic Care Management Pharmacy Assistant   Name: Ketan Renz   MRN: [id number]        DOB: 08-30-1930     Reason for Encounter: Status on Patient Assistance Application of Eliquis   Medications: Outpatient Encounter Medications as of 01/26/2021  Medication Sig   acetaminophen (TYLENOL) 650 MG CR tablet Take 650 mg by mouth every 8 (eight) hours as needed for pain.   amiodarone (PACERONE) 200 MG tablet Take 1 tablet (200 mg total) by mouth daily.   apixaban (ELIQUIS) 5 MG TABS tablet Take 1 tablet (5 mg total) by mouth 2 (two) times daily.   aspirin EC 81 MG tablet Take 81 mg by mouth daily. Swallow whole.   atorvastatin (LIPITOR) 20 MG tablet Take 1 tablet (20 mg total) by mouth daily.   blood glucose meter kit and supplies KIT Dispense based on patient and insurance preference. Use up to four times daily as directed. (FOR ICD-9 250.00, 250.01).   Blood Glucose Monitoring Suppl (ACCU-CHEK AVIVA PLUS) w/Device KIT Use to check sugar levels x 2 daily   carbidopa-levodopa (SINEMET IR) 25-100 MG tablet Take 1 tablet by mouth 3 (three) times daily.   docusate sodium (COLACE) 100 MG capsule Take 1 capsule (100 mg total) by mouth 2 (two) times daily as needed for mild constipation.   DULoxetine (CYMBALTA) 30 MG capsule Take 1 capsule (30 mg total) by mouth daily.   empagliflozin (JARDIANCE) 25 MG TABS tablet Take 1 tablet (25 mg total) by mouth daily before breakfast.   gabapentin (NEURONTIN) 100 MG capsule Take 1 capsule (100 mg total) by mouth at bedtime.   isosorbide-hydrALAZINE (BIDIL) 20-37.5 MG tablet Take 1 tablet by mouth 3 (three) times daily.   Lancets (ACCU-CHEK SOFT TOUCH) lancets Use as instructed   latanoprost (XALATAN) 0.005 % ophthalmic solution Place 1 drop into both eyes at bedtime.    Magnesium Oxide 400 (240 Mg) MG TABS Take 1 tablet (400 mg total) by mouth 2 (two) times daily.   metFORMIN (GLUCOPHAGE) 500 MG tablet Take 1 tablet (500 mg total) by mouth 2 (two) times  daily with a meal.   metoprolol succinate (TOPROL-XL) 25 MG 24 hr tablet Take 1 tablet (25 mg total) by mouth daily.   ONETOUCH ULTRA test strip USE TO CHECK SUGAR LEVELS TWICE DAILY.   pantoprazole (PROTONIX) 40 MG tablet Take 1 tablet (40 mg total) by mouth daily.   Polyethylene Glycol 3350 (MIRALAX PO) Take 17 g by mouth as needed.   tamsulosin (FLOMAX) 0.4 MG CAPS capsule Take 1 capsule (0.4 mg total) by mouth daily.   No facility-administered encounter medications on file as of 01/26/2021.    No facility-administered encounter medications on file as of 01/26/2021.    Checking the statues on the Eliquis patient assistance application states Manufactuer states there is a missing signature on the form by the patient. Patient daughter answered and states that  she is not sure. Patient daughter states that the patient does not want to take his medications and feels like he is giving up. He is also not able to eat. Patients daughter states she will call us back once the patient feels up to it.   Corrie Mckusick, [place]

## 2021-02-03 NOTE — Telephone Encounter (Signed)
I can serve as attending- but I do not come to the hospice home

## 2021-02-03 NOTE — Telephone Encounter (Signed)
Verbal given.   Please send the the following prescriptions to [address]  Morphine '20mg'$ /79m  Administer 0.255mQ4H PRN for shortness of breath  Ativan '2mg'$ /28m30mdminister 0.8m88mH PRN for aggitation

## 2021-02-03 NOTE — Telephone Encounter (Signed)
Copied from [place] [id number]. Topic: General - Inquiry >> 02-11-2021 10:34 AM [place] wrote: Reason for CRM: Carla from Hospice is calling, Pt is going into Hospice home today and is being released today and they are awaiting a cb from office to confirm that Dr Lenna Sciara will be his attending in hospice home. FU requested asap to Carla at [phone]   Routing to provider.

## 2021-02-03 DEATH — deceased

## 2021-03-15 ENCOUNTER — Ambulatory Visit: Payer: HMO | Admitting: Physician Assistant

## 2021-03-15 ENCOUNTER — Ambulatory Visit: Payer: HMO

## 2021-05-17 ENCOUNTER — Other Ambulatory Visit: Payer: Self-pay

## 2021-05-19 ENCOUNTER — Ambulatory Visit: Payer: Self-pay | Admitting: Urology

## 2021-06-13 ENCOUNTER — Ambulatory Visit: Payer: HMO | Admitting: Family Medicine

## 2021-10-28 ENCOUNTER — Ambulatory Visit: Payer: HMO
